# Patient Record
Sex: Female | Born: 1945 | Race: Black or African American | Hispanic: No | Marital: Single | Smoking: Former smoker
Health system: Southern US, Community
[De-identification: ages and names within clinical notes are randomized; demographics above are authoritative.]

## PROBLEM LIST (undated history)

## (undated) DIAGNOSIS — K219 Gastro-esophageal reflux disease without esophagitis: Secondary | ICD-10-CM

## (undated) DIAGNOSIS — I209 Angina pectoris, unspecified: Secondary | ICD-10-CM

## (undated) DIAGNOSIS — K649 Unspecified hemorrhoids: Secondary | ICD-10-CM

## (undated) DIAGNOSIS — I82409 Acute embolism and thrombosis of unspecified deep veins of unspecified lower extremity: Secondary | ICD-10-CM

## (undated) DIAGNOSIS — Z9889 Other specified postprocedural states: Secondary | ICD-10-CM

## (undated) DIAGNOSIS — K8020 Calculus of gallbladder without cholecystitis without obstruction: Secondary | ICD-10-CM

## (undated) DIAGNOSIS — R32 Unspecified urinary incontinence: Secondary | ICD-10-CM

## (undated) DIAGNOSIS — R112 Nausea with vomiting, unspecified: Secondary | ICD-10-CM

## (undated) DIAGNOSIS — R351 Nocturia: Secondary | ICD-10-CM

## (undated) DIAGNOSIS — G4730 Sleep apnea, unspecified: Secondary | ICD-10-CM

## (undated) DIAGNOSIS — R011 Cardiac murmur, unspecified: Secondary | ICD-10-CM

## (undated) DIAGNOSIS — D649 Anemia, unspecified: Secondary | ICD-10-CM

## (undated) DIAGNOSIS — Z9289 Personal history of other medical treatment: Secondary | ICD-10-CM

## (undated) DIAGNOSIS — E785 Hyperlipidemia, unspecified: Secondary | ICD-10-CM

## (undated) DIAGNOSIS — N189 Chronic kidney disease, unspecified: Secondary | ICD-10-CM

## (undated) DIAGNOSIS — I2609 Other pulmonary embolism with acute cor pulmonale: Secondary | ICD-10-CM

## (undated) DIAGNOSIS — M1712 Unilateral primary osteoarthritis, left knee: Secondary | ICD-10-CM

## (undated) DIAGNOSIS — E039 Hypothyroidism, unspecified: Secondary | ICD-10-CM

## (undated) DIAGNOSIS — R0600 Dyspnea, unspecified: Secondary | ICD-10-CM

## (undated) DIAGNOSIS — I739 Peripheral vascular disease, unspecified: Secondary | ICD-10-CM

## (undated) DIAGNOSIS — G8929 Other chronic pain: Secondary | ICD-10-CM

## (undated) DIAGNOSIS — E119 Type 2 diabetes mellitus without complications: Secondary | ICD-10-CM

## (undated) DIAGNOSIS — M549 Dorsalgia, unspecified: Secondary | ICD-10-CM

## (undated) DIAGNOSIS — H409 Unspecified glaucoma: Secondary | ICD-10-CM

## (undated) DIAGNOSIS — H3530 Unspecified macular degeneration: Secondary | ICD-10-CM

## (undated) DIAGNOSIS — K5790 Diverticulosis of intestine, part unspecified, without perforation or abscess without bleeding: Secondary | ICD-10-CM

## (undated) DIAGNOSIS — Z8739 Personal history of other diseases of the musculoskeletal system and connective tissue: Secondary | ICD-10-CM

## (undated) DIAGNOSIS — Z8719 Personal history of other diseases of the digestive system: Secondary | ICD-10-CM

## (undated) DIAGNOSIS — J45909 Unspecified asthma, uncomplicated: Secondary | ICD-10-CM

## (undated) DIAGNOSIS — R002 Palpitations: Secondary | ICD-10-CM

## (undated) DIAGNOSIS — I10 Essential (primary) hypertension: Secondary | ICD-10-CM

## (undated) DIAGNOSIS — K5909 Other constipation: Secondary | ICD-10-CM

## (undated) DIAGNOSIS — Z8709 Personal history of other diseases of the respiratory system: Secondary | ICD-10-CM

## (undated) HISTORY — DX: Unilateral primary osteoarthritis, left knee: M17.12

## (undated) HISTORY — DX: Gastro-esophageal reflux disease without esophagitis: K21.9

## (undated) HISTORY — PX: DILATION AND CURETTAGE OF UTERUS: SHX78

## (undated) HISTORY — PX: OTHER SURGICAL HISTORY: SHX169

## (undated) HISTORY — DX: Other pulmonary embolism with acute cor pulmonale: I26.09

## (undated) HISTORY — PX: COLONOSCOPY: SHX174

## (undated) HISTORY — DX: Type 2 diabetes mellitus without complications: E11.9

## (undated) HISTORY — DX: Hyperlipidemia, unspecified: E78.5

## (undated) HISTORY — PX: TRANSTHORACIC ECHOCARDIOGRAM: SHX275

## (undated) HISTORY — PX: ESOPHAGOGASTRODUODENOSCOPY: SHX1529

## (undated) HISTORY — DX: Diverticulosis of intestine, part unspecified, without perforation or abscess without bleeding: K57.90

## (undated) HISTORY — DX: Hypothyroidism, unspecified: E03.9

## (undated) HISTORY — DX: Essential (primary) hypertension: I10

---

## 1977-11-25 HISTORY — PX: VAGINAL HYSTERECTOMY: SUR661

## 2003-11-26 HISTORY — PX: TOTAL HIP ARTHROPLASTY: SHX124

## 2008-11-25 HISTORY — PX: THYROID SURGERY: SHX805

## 2013-01-12 DIAGNOSIS — H18519 Endothelial corneal dystrophy, unspecified eye: Secondary | ICD-10-CM | POA: Diagnosis not present

## 2013-01-12 DIAGNOSIS — H524 Presbyopia: Secondary | ICD-10-CM | POA: Diagnosis not present

## 2013-01-12 DIAGNOSIS — H40019 Open angle with borderline findings, low risk, unspecified eye: Secondary | ICD-10-CM | POA: Diagnosis not present

## 2013-01-12 DIAGNOSIS — H35019 Changes in retinal vascular appearance, unspecified eye: Secondary | ICD-10-CM | POA: Diagnosis not present

## 2013-01-12 DIAGNOSIS — H35039 Hypertensive retinopathy, unspecified eye: Secondary | ICD-10-CM | POA: Diagnosis not present

## 2013-06-11 DIAGNOSIS — M1710 Unilateral primary osteoarthritis, unspecified knee: Secondary | ICD-10-CM | POA: Diagnosis not present

## 2013-06-11 DIAGNOSIS — M545 Low back pain, unspecified: Secondary | ICD-10-CM | POA: Diagnosis not present

## 2013-06-11 DIAGNOSIS — IMO0002 Reserved for concepts with insufficient information to code with codable children: Secondary | ICD-10-CM | POA: Diagnosis not present

## 2013-06-11 DIAGNOSIS — M25559 Pain in unspecified hip: Secondary | ICD-10-CM | POA: Diagnosis not present

## 2013-06-19 DIAGNOSIS — M47817 Spondylosis without myelopathy or radiculopathy, lumbosacral region: Secondary | ICD-10-CM | POA: Diagnosis not present

## 2013-06-25 DIAGNOSIS — M47817 Spondylosis without myelopathy or radiculopathy, lumbosacral region: Secondary | ICD-10-CM | POA: Diagnosis not present

## 2013-07-28 DIAGNOSIS — E039 Hypothyroidism, unspecified: Secondary | ICD-10-CM | POA: Diagnosis not present

## 2013-07-28 DIAGNOSIS — E785 Hyperlipidemia, unspecified: Secondary | ICD-10-CM | POA: Diagnosis not present

## 2013-07-28 DIAGNOSIS — Q619 Cystic kidney disease, unspecified: Secondary | ICD-10-CM | POA: Diagnosis not present

## 2013-07-28 DIAGNOSIS — I10 Essential (primary) hypertension: Secondary | ICD-10-CM | POA: Diagnosis not present

## 2013-07-28 DIAGNOSIS — E041 Nontoxic single thyroid nodule: Secondary | ICD-10-CM | POA: Diagnosis not present

## 2013-07-28 DIAGNOSIS — R7309 Other abnormal glucose: Secondary | ICD-10-CM | POA: Diagnosis not present

## 2013-07-28 DIAGNOSIS — K219 Gastro-esophageal reflux disease without esophagitis: Secondary | ICD-10-CM | POA: Diagnosis not present

## 2013-07-28 DIAGNOSIS — Z23 Encounter for immunization: Secondary | ICD-10-CM | POA: Diagnosis not present

## 2013-07-29 ENCOUNTER — Other Ambulatory Visit: Payer: Self-pay | Admitting: Family Medicine

## 2013-07-29 DIAGNOSIS — E042 Nontoxic multinodular goiter: Secondary | ICD-10-CM

## 2013-08-02 ENCOUNTER — Ambulatory Visit
Admission: RE | Admit: 2013-08-02 | Discharge: 2013-08-02 | Disposition: A | Discharge status: Home / Self Care | Payer: Medicare Other | Source: Ambulatory Visit | Attending: Family Medicine | Admitting: Family Medicine

## 2013-08-02 DIAGNOSIS — E042 Nontoxic multinodular goiter: Secondary | ICD-10-CM

## 2013-09-06 DIAGNOSIS — Z23 Encounter for immunization: Secondary | ICD-10-CM | POA: Diagnosis not present

## 2013-09-09 DIAGNOSIS — M1710 Unilateral primary osteoarthritis, unspecified knee: Secondary | ICD-10-CM | POA: Diagnosis not present

## 2013-10-25 DIAGNOSIS — IMO0002 Reserved for concepts with insufficient information to code with codable children: Secondary | ICD-10-CM | POA: Diagnosis not present

## 2013-10-25 DIAGNOSIS — M48061 Spinal stenosis, lumbar region without neurogenic claudication: Secondary | ICD-10-CM | POA: Diagnosis not present

## 2013-11-22 ENCOUNTER — Encounter: Payer: Self-pay | Admitting: Cardiology

## 2013-11-22 DIAGNOSIS — K219 Gastro-esophageal reflux disease without esophagitis: Secondary | ICD-10-CM | POA: Diagnosis not present

## 2013-11-22 DIAGNOSIS — E039 Hypothyroidism, unspecified: Secondary | ICD-10-CM | POA: Diagnosis not present

## 2013-11-22 DIAGNOSIS — R7309 Other abnormal glucose: Secondary | ICD-10-CM | POA: Diagnosis not present

## 2013-11-22 DIAGNOSIS — I10 Essential (primary) hypertension: Secondary | ICD-10-CM | POA: Insufficient documentation

## 2013-11-22 DIAGNOSIS — E785 Hyperlipidemia, unspecified: Secondary | ICD-10-CM | POA: Insufficient documentation

## 2013-11-22 DIAGNOSIS — I517 Cardiomegaly: Secondary | ICD-10-CM | POA: Diagnosis not present

## 2013-11-23 ENCOUNTER — Encounter: Payer: Self-pay | Admitting: Cardiology

## 2013-12-03 ENCOUNTER — Other Ambulatory Visit: Payer: Self-pay

## 2013-12-03 DIAGNOSIS — Z1231 Encounter for screening mammogram for malignant neoplasm of breast: Secondary | ICD-10-CM

## 2013-12-06 ENCOUNTER — Encounter: Payer: Self-pay | Admitting: Cardiology

## 2013-12-06 ENCOUNTER — Ambulatory Visit (INDEPENDENT_AMBULATORY_CARE_PROVIDER_SITE_OTHER): Payer: Medicare Other | Admitting: Cardiology

## 2013-12-06 VITALS — BP 130/84 | HR 84 | Ht 61.0 in | Wt 212.0 lb

## 2013-12-06 DIAGNOSIS — R002 Palpitations: Secondary | ICD-10-CM | POA: Insufficient documentation

## 2013-12-06 DIAGNOSIS — R5381 Other malaise: Secondary | ICD-10-CM

## 2013-12-06 DIAGNOSIS — E039 Hypothyroidism, unspecified: Secondary | ICD-10-CM

## 2013-12-06 DIAGNOSIS — R5383 Other fatigue: Secondary | ICD-10-CM | POA: Diagnosis not present

## 2013-12-06 DIAGNOSIS — I493 Ventricular premature depolarization: Secondary | ICD-10-CM | POA: Insufficient documentation

## 2013-12-06 DIAGNOSIS — I4949 Other premature depolarization: Secondary | ICD-10-CM | POA: Diagnosis not present

## 2013-12-06 NOTE — Progress Notes (Signed)
[facility]. [address], [address] [place], [place]  [address] Phone: [phone] Fax:  [phone]  Date:  12/06/2013   ID:  Tricia Harrison, DOB 01/09/1946, MRN [id number]  PCP:  No primary provider on file.   History of Present Illness: Tricia Harrison is a 68 y.o. female here for evaluation of right atrial enlargement. According to records, she recently moved from [place] and had workup done for elevated right atrial pressures including MRI of chest and no abnormalities were detected. Followup was advised.  I reviewed a previous cardiology note on 10/07/12. She previously had palpitations on exertion lasting approximately 1 minute. She had a laxity scan Myoview with EF of 60% that was "negative ". A Holter monitor showed 1800 isolated PVCs with no ventricular tachycardia. Echocardiogram showed normal EF, Doppler flow in right atrium of indeterminate origin, rule out anomalous venous drainage versus small ASD versus coronary fistula-therefore she had MRI. MRA/MRI of chest was done on 12/10/12 and showed no anomalous venous drainage into right atrium. 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 TEE or transesophageal echocardiogram was also done which showed mildly dilated right side with preserved systolic function, normal left ventricular ejection fraction, no evidence of PFO or ASD.  Her main complaint is shortness of breath, fatigue, occasional palpitations. As she ages, the symptoms seem to get worse. She at one point cut out caffeine and this helped out significantly.   Wt Readings from Last 3 Encounters:  12/06/13 212 lb (96.163 kg)     Past Medical History  Diagnosis Date  . HTN (hypertension)   . Hypothyroidism   . Bilateral ovarian cysts   . Hx of gallstones   . Hernia, hiatal   . Renal cysts, acquired, bilateral   . GERD (gastroesophageal reflux disease)   . Macular degeneration, left eye   . HLD (hyperlipidemia)   .  Diverticulosis   . Lumbar spinal stenosis     Past Surgical History  Procedure Laterality Date  . Vaginal hysterectomy    . Total hip arthroplasty Left   . Thyroid surgery      Current Outpatient Prescriptions  Medication Sig Dispense Refill  . Cholecalciferol (VITAMIN D3) 2000 UNITS TABS Take 2,000 mg by mouth daily.      Marland Kitchen diltiazem (CARDIZEM LA) 360 MG 24 hr tablet Take 360 mg by mouth daily.      . fenofibrate (TRICOR) 145 MG tablet Take 145 mg by mouth daily.      Marland Kitchen levothyroxine (SYNTHROID, LEVOTHROID) 75 MCG tablet Take 75 mcg by mouth daily before breakfast.      . ramipril (ALTACE) 10 MG capsule Take 10 mg by mouth daily.      . traMADol (ULTRAM) 50 MG tablet Take 50 mg by mouth every 6 (six) hours as needed.       No current facility-administered medications for this visit.    Allergies:    Allergies  Allergen Reactions  . Codeine Nausea And Vomiting    Social History:  The patient  reports that she has quit smoking. She does not have any smokeless tobacco history on file. She reports that she drinks about 1.8 ounces of alcohol per week. She reports that she does not use illicit drugs.   Family History  Problem Relation Age of Onset  . Uterine cancer Mother   . Heart disease Mother   . Hypertension Mother   . Diabetes Mother   . Cirrhosis Father   .  Pneumonia Father   . Lymphoma Sister     ,non hodgkin   . Diabetes Sister     ROS:  Please see the history of present illness.   No syncope, no bleeding, no orthopnea, no PND. Positive for palpitations, fatigue, weight.   All other systems reviewed and negative.   PHYSICAL EXAM: VS:  Ht 5\' 1"  (1.549 m)  Wt 212 lb (96.163 kg)  BMI 40.08 kg/m2 Well nourished, well developed, in no acute distress HEENT: normal, [place]/AT, EOMI Neck: no JVD, normal carotid upstroke, no bruit Cardiac:  normal S1, S2; RRR; no murmur Lungs:  clear to auscultation bilaterally, no wheezing, rhonchi or rales Abd: soft, nontender, no  hepatomegaly, no bruitsObese Ext: no edema, 2+ distal pulses Skin: warm and dry GU: deferred Neuro: no focal abnormalities noted, AAO x 3  EKG:   None today. Sinus rhythm, PVC 2013 rate 70 Extensive review of previous medical records as described above.  ASSESSMENT AND PLAN:  1. Palpitations-continue with diltiazem to help with suppression. Continue to try to avoid caffeinated products and decongestants. Exercise will be very helpful for this as well. Weight loss will be helpful as well. No further cardiac testing warranted. She had extensive testing including stress test, echocardiogram, transesophageal echocardiogram, MRA of chest previously. No evidence of ASD. 2. Dilated right atrium-this seemed to be prominent on MRA as well as TEE. This is likely secondary to obesity. No evidence of ASD. Reassurance. 3. Morbid obesity-continue to encourage weight loss. Fatigue has been an issue. When she decreased her carbohydrates, she stated that she became more fatigued. 4. Fatigue-likely multifactorial. One could consider sleep study to exclude sleep apnea. She has not had this done. 5. PVCs-as above, diltiazem. 6. We'll see back in one year. No further cardiac testing.  Signed, Candee Furbish, MD [facility]  12/06/2013 2:04 PM

## 2013-12-06 NOTE — Patient Instructions (Signed)
Your physician wants you to follow-up in: 1 year with Marlou Porch You will receive a reminder letter in the mail two months in advance. If you don't receive a letter, please call our office to schedule the follow-up appointment.  Your physician recommends that you continue on your current medications as directed. Please refer to the Current Medication list given to you today.

## 2013-12-21 DIAGNOSIS — M1710 Unilateral primary osteoarthritis, unspecified knee: Secondary | ICD-10-CM | POA: Diagnosis not present

## 2013-12-21 DIAGNOSIS — IMO0002 Reserved for concepts with insufficient information to code with codable children: Secondary | ICD-10-CM | POA: Diagnosis not present

## 2013-12-22 ENCOUNTER — Ambulatory Visit
Admission: RE | Admit: 2013-12-22 | Discharge: 2013-12-22 | Disposition: A | Discharge status: Home / Self Care | Payer: Medicare Other | Source: Ambulatory Visit

## 2013-12-22 DIAGNOSIS — Z1231 Encounter for screening mammogram for malignant neoplasm of breast: Secondary | ICD-10-CM | POA: Diagnosis not present

## 2013-12-30 ENCOUNTER — Encounter: Payer: Medicare Other | Attending: Family Medicine

## 2013-12-30 VITALS — Ht 61.0 in | Wt 208.0 lb

## 2013-12-30 DIAGNOSIS — E119 Type 2 diabetes mellitus without complications: Secondary | ICD-10-CM | POA: Insufficient documentation

## 2013-12-30 NOTE — Progress Notes (Signed)
Patient was seen on 12/30/13 for the first of a series of three diabetes self-management courses at the [facility].  Current HbA1c: 6.5%  The following learning objectives were met by the patient during this class:  Describe diabetes  State some common risk factors for diabetes  Defines the role of glucose and insulin  Identifies type of diabetes and pathophysiology  Describe the relationship between diabetes and cardiovascular risk  State the members of the Healthcare Team  States the rationale for glucose monitoring  State when to test glucose  State their individual Target Range  State the importance of logging glucose readings  Describe how to interpret glucose readings  Identifies A1C target  Explain the correlation between A1c and eAG values  State symptoms and treatment of high blood glucose  State symptoms and treatment of low blood glucose  Explain proper technique for glucose testing  Identifies proper sharps disposal  Handouts given during class include:  Living Well with Diabetes book  Carb Counting and Meal Planning book  Meal Plan Card  Carbohydrate guide  Meal planning worksheet  Low Sodium Flavoring Tips  The diabetes portion plate  V0Y to eAG Conversion Chart  Diabetes Medications  Diabetes Recommended Care Schedule  Support Group  Diabetes Success Plan  Core Class Satisfaction Survey  Follow-Up Plan:  Attend core 2

## 2014-01-06 DIAGNOSIS — E119 Type 2 diabetes mellitus without complications: Secondary | ICD-10-CM

## 2014-01-13 DIAGNOSIS — E119 Type 2 diabetes mellitus without complications: Secondary | ICD-10-CM

## 2014-01-14 NOTE — Progress Notes (Signed)
Patient was seen on 01/13/14 for the third of a series of three diabetes self-management courses at the [facility]. The following learning objectives were met by the patient during this class:    State the amount of activity recommended for healthy living   Describe activities suitable for individual needs   Identify ways to regularly incorporate activity into daily life   Identify barriers to activity and ways to over come these barriers  Identify diabetes medications being personally used and their primary action for lowering glucose and possible side effects   Describe role of stress on blood glucose and develop strategies to address psychosocial issues   Identify diabetes complications and ways to prevent them  Explain how to manage diabetes during illness   Evaluate success in meeting personal goal   Establish 2-3 goals that they will plan to diligently work on until they return for the  44-monthfollow-up visit  Goals:  Follow Diabetes Meal Plan as instructed  Aim for 15-30 mins of physical activity daily as tolerated  Bring food record and glucose log to your follow up visit  Your patient has established the following 4 month goals in their individualized success plan:  Count carbohydrates at most meals and snacks  Reduce fat in my diet by eating less  Increase my activity at least 5 days a week for 30 minutes  Test my glucose at least 2 X per week  Look for patterns in my record book at least 5 days a month  To help manage my stress, I will exercise at least 5 times a week  I will eat 3 meals each day to include protein and carbs  Your patient has identified these potential barriers to change:  Eating out so often  Your patient has identified their diabetes self-care support plan as  [facility]  Family

## 2014-01-17 NOTE — Progress Notes (Signed)

## 2014-01-25 ENCOUNTER — Telehealth: Payer: Self-pay | Admitting: Cardiology

## 2014-01-25 NOTE — Telephone Encounter (Signed)
Surgical Clearance

## 2014-02-01 ENCOUNTER — Other Ambulatory Visit: Payer: Self-pay | Admitting: Physician Assistant

## 2014-02-01 ENCOUNTER — Encounter: Payer: Self-pay | Admitting: Physician Assistant

## 2014-02-01 DIAGNOSIS — K219 Gastro-esophageal reflux disease without esophagitis: Secondary | ICD-10-CM

## 2014-02-01 DIAGNOSIS — M1712 Unilateral primary osteoarthritis, left knee: Secondary | ICD-10-CM

## 2014-02-01 DIAGNOSIS — K5790 Diverticulosis of intestine, part unspecified, without perforation or abscess without bleeding: Secondary | ICD-10-CM | POA: Insufficient documentation

## 2014-02-01 DIAGNOSIS — M48061 Spinal stenosis, lumbar region without neurogenic claudication: Secondary | ICD-10-CM | POA: Insufficient documentation

## 2014-02-01 DIAGNOSIS — IMO0002 Reserved for concepts with insufficient information to code with codable children: Secondary | ICD-10-CM | POA: Diagnosis not present

## 2014-02-01 DIAGNOSIS — N281 Cyst of kidney, acquired: Secondary | ICD-10-CM

## 2014-02-01 DIAGNOSIS — K449 Diaphragmatic hernia without obstruction or gangrene: Secondary | ICD-10-CM

## 2014-02-01 DIAGNOSIS — M1710 Unilateral primary osteoarthritis, unspecified knee: Secondary | ICD-10-CM | POA: Diagnosis not present

## 2014-02-01 DIAGNOSIS — Z8719 Personal history of other diseases of the digestive system: Secondary | ICD-10-CM | POA: Insufficient documentation

## 2014-02-01 DIAGNOSIS — N83201 Unspecified ovarian cyst, right side: Secondary | ICD-10-CM

## 2014-02-01 DIAGNOSIS — E119 Type 2 diabetes mellitus without complications: Secondary | ICD-10-CM | POA: Insufficient documentation

## 2014-02-01 DIAGNOSIS — N83202 Unspecified ovarian cyst, left side: Principal | ICD-10-CM

## 2014-02-01 NOTE — H&P (Signed)
TOTAL KNEE ADMISSION H&P  Patient is being admitted for left total knee arthroplasty.  Subjective:  Chief Complaint:left knee pain.  HPI: Tricia Harrison, 68 y.o. female, has a history of pain and functional disability in the left knee due to arthritis and has failed non-surgical conservative treatments for greater than 12 weeks to includeNSAID's and/or analgesics, corticosteriod injections, viscosupplementation injections, flexibility and strengthening excercises, supervised PT with diminished ADL's post treatment, use of assistive devices, weight reduction as appropriate and activity modification.  Onset of symptoms was gradual, starting 10 years ago with gradually worsening course since that time. The patient noted no past surgery on the left knee(s).  Patient currently rates pain in the left knee(s) at 10 out of 10 with activity. Patient has night pain, worsening of pain with activity and weight bearing, pain that interferes with activities of daily living, crepitus and joint swelling.  Patient has evidence of subchondral sclerosis, periarticular osteophytes and joint space narrowing by imaging studies.  There is no active infection.  Patient Active Problem List   Diagnosis Date Noted  . Bilateral ovarian cysts   . Hx of gallstones   . Hernia, hiatal   . Renal cysts, acquired, bilateral   . GERD (gastroesophageal reflux disease)   . Diverticulosis   . Lumbar spinal stenosis   . Diabetes mellitus without complication   . Left knee DJD   . Palpitations 12/06/2013  . Morbid obesity 12/06/2013  . Fatigue 12/06/2013  . PVC (premature ventricular contraction) 12/06/2013  . HTN (hypertension)   . Hypothyroidism   . HLD (hyperlipidemia)    Past Medical History  Diagnosis Date  . HTN (hypertension)   . Hypothyroidism   . Bilateral ovarian cysts   . Hx of gallstones   . Hernia, hiatal   . Renal cysts, acquired, bilateral   . GERD (gastroesophageal reflux disease)   . Macular  degeneration, left eye   . HLD (hyperlipidemia)   . Diverticulosis   . Lumbar spinal stenosis   . Diabetes mellitus without complication   . Left knee DJD     Past Surgical History  Procedure Laterality Date  . Vaginal hysterectomy    . Total hip arthroplasty Left   . Thyroid surgery       (Not in a hospital admission) Allergies  Allergen Reactions  . Codeine Nausea And Vomiting    Current Outpatient Prescriptions on File Prior to Visit  Medication Sig Dispense Refill  . Cholecalciferol (VITAMIN D3) 2000 UNITS TABS Take 2,000 mg by mouth daily.      Marland Kitchen diltiazem (CARDIZEM LA) 360 MG 24 hr tablet Take 360 mg by mouth daily.      . fenofibrate (TRICOR) 145 MG tablet Take 145 mg by mouth daily.      Marland Kitchen levothyroxine (SYNTHROID, LEVOTHROID) 75 MCG tablet Take 75 mcg by mouth daily before breakfast.      . ramipril (ALTACE) 10 MG capsule Take 10 mg by mouth daily.      . traMADol (ULTRAM) 50 MG tablet Take 50 mg by mouth every 6 (six) hours as needed.       No current facility-administered medications on file prior to visit.    History  Substance Use Topics  . Smoking status: Former Research scientist (life sciences)  . Smokeless tobacco: Not on file  . Alcohol Use: 1.8 oz/week    3 Glasses of wine per week     Comment: wine 2-3 times a week    Family History  Problem Relation Age of  Onset  . Uterine cancer Mother   . Heart disease Mother   . Hypertension Mother   . Diabetes Mother   . Cirrhosis Father   . Pneumonia Father   . Lymphoma Sister     ,non hodgkin   . Diabetes Sister      Review of Systems  Constitutional: Negative.   HENT: Negative.   Eyes: Negative.   Respiratory: Negative.   Cardiovascular: Negative.   Gastrointestinal: Negative.   Genitourinary: Negative.   Musculoskeletal: Positive for back pain and joint pain.       Left knee pain and low back pain  Skin: Negative.   Neurological: Negative.   Endo/Heme/Allergies: Negative.   Psychiatric/Behavioral: Negative.      Objective:  Physical Exam  Constitutional: She is oriented to person, place, and time. She appears well-developed and well-nourished.  HENT:  Head: Normocephalic and atraumatic.  Mouth/Throat: Oropharynx is clear and moist.  Eyes: Conjunctivae and EOM are normal. Pupils are equal, round, and reactive to light.  Neck: Neck supple.  Cardiovascular: Normal rate and intact distal pulses.  Exam reveals no gallop and no friction rub.   Murmur heard. Respiratory: Effort normal and breath sounds normal. No respiratory distress. She has no wheezes. She has no rales. She exhibits no tenderness.  GI: Soft. Bowel sounds are normal. She exhibits no distension and no mass. There is no tenderness. There is no rebound and no guarding.  Genitourinary:  Not pertinent to current symptomatology therefore not examined.  Musculoskeletal:  Examination of her left knee reveals valgus deformity pain medially and laterally, 1+ synovitis 1+ crepitation diffuse pain, range of motion from -5 to 125 degrees with normal patella tracking. Exam of the right knee reveals mild pain 1+ synovitis full range of motion knee is stable with normal patella tracking. Vascular exam: pulses 2+ and symmetric. She does have significant weakness trying to flex her hip and bend her left knee and has difficulty getting in and out of a car because of this.   Neurological: She is alert and oriented to person, place, and time.  Skin: Skin is warm and dry.  Psychiatric: She has a normal mood and affect. Her behavior is normal.    Vital signs in last 24 hours: Last recorded: 03/10 1300   BP: 119/76 Pulse: 85  Temp: 97.5 F (36.4 C)    Height: 5' (1.524 m) SpO2: 99  Weight: 93.441 kg (206 lb)     Labs:   Estimated body mass index is 40.23 kg/(m^2) as calculated from the following:   Height as of this encounter: 5' (1.524 m).   Weight as of this encounter: 93.441 kg (206 lb).   Imaging Review Plain radiographs demonstrate  severe degenerative joint disease of the left knee(s). The overall alignment issignificant valgus. The bone quality appears to be good for age and reported activity level.  Assessment/Plan:  End stage arthritis, left knee   The patient history, physical examination, clinical judgment of the provider and imaging studies are consistent with end stage degenerative joint disease of the left knee(s) and total knee arthroplasty is deemed medically necessary. The treatment options including medical management, injection therapy arthroscopy and arthroplasty were discussed at length. The risks and benefits of total knee arthroplasty were presented and reviewed. The risks due to aseptic loosening, infection, stiffness, patella tracking problems, thromboembolic complications and other imponderables were discussed. The patient acknowledged the explanation, agreed to proceed with the plan and consent was signed. Patient is being admitted for  inpatient treatment for surgery, pain control, PT, OT, prophylactic antibiotics, VTE prophylaxis, progressive ambulation and ADL's and discharge planning. The patient is planning to be discharged home with home health services  Hilde Churchman A. Meade Hogeland, PA-C Physician Assistant [facility] [phone]  02/01/2014, 2:08 PM   

## 2014-02-07 ENCOUNTER — Ambulatory Visit
Admission: RE | Admit: 2014-02-07 | Discharge: 2014-02-07 | Disposition: A | Discharge status: Home / Self Care | Payer: Medicare Other | Source: Ambulatory Visit | Attending: Family Medicine | Admitting: Family Medicine

## 2014-02-07 ENCOUNTER — Other Ambulatory Visit: Payer: Self-pay | Admitting: Family Medicine

## 2014-02-07 ENCOUNTER — Encounter (HOSPITAL_COMMUNITY): Payer: Self-pay | Admitting: Pharmacy Technician

## 2014-02-07 DIAGNOSIS — I10 Essential (primary) hypertension: Secondary | ICD-10-CM | POA: Diagnosis not present

## 2014-02-07 DIAGNOSIS — M25569 Pain in unspecified knee: Secondary | ICD-10-CM | POA: Diagnosis not present

## 2014-02-07 DIAGNOSIS — E785 Hyperlipidemia, unspecified: Secondary | ICD-10-CM | POA: Diagnosis not present

## 2014-02-07 DIAGNOSIS — Z01818 Encounter for other preprocedural examination: Secondary | ICD-10-CM | POA: Diagnosis not present

## 2014-02-07 DIAGNOSIS — E039 Hypothyroidism, unspecified: Secondary | ICD-10-CM | POA: Diagnosis not present

## 2014-02-07 DIAGNOSIS — R7309 Other abnormal glucose: Secondary | ICD-10-CM | POA: Diagnosis not present

## 2014-02-14 ENCOUNTER — Encounter (HOSPITAL_COMMUNITY)
Admission: RE | Admit: 2014-02-14 | Discharge: 2014-02-14 | Disposition: A | Discharge status: Home / Self Care | Payer: Medicare Other | Source: Ambulatory Visit | Attending: Orthopedic Surgery | Admitting: Orthopedic Surgery

## 2014-02-14 ENCOUNTER — Encounter (HOSPITAL_COMMUNITY): Payer: Self-pay

## 2014-02-14 DIAGNOSIS — Z01812 Encounter for preprocedural laboratory examination: Secondary | ICD-10-CM | POA: Diagnosis not present

## 2014-02-14 HISTORY — DX: Anemia, unspecified: D64.9

## 2014-02-14 HISTORY — DX: Personal history of other diseases of the respiratory system: Z87.09

## 2014-02-14 HISTORY — DX: Other constipation: K59.09

## 2014-02-14 HISTORY — DX: Calculus of gallbladder without cholecystitis without obstruction: K80.20

## 2014-02-14 HISTORY — DX: Personal history of other diseases of the digestive system: Z87.19

## 2014-02-14 HISTORY — DX: Nocturia: R35.1

## 2014-02-14 HISTORY — DX: Personal history of other diseases of the musculoskeletal system and connective tissue: Z87.39

## 2014-02-14 HISTORY — DX: Palpitations: R00.2

## 2014-02-14 HISTORY — DX: Other specified postprocedural states: R11.2

## 2014-02-14 HISTORY — DX: Other chronic pain: G89.29

## 2014-02-14 HISTORY — DX: Personal history of other medical treatment: Z92.89

## 2014-02-14 HISTORY — DX: Dorsalgia, unspecified: M54.9

## 2014-02-14 HISTORY — DX: Unspecified hemorrhoids: K64.9

## 2014-02-14 HISTORY — DX: Unspecified asthma, uncomplicated: J45.909

## 2014-02-14 HISTORY — DX: Other specified postprocedural states: Z98.890

## 2014-02-14 LAB — URINALYSIS, ROUTINE W REFLEX MICROSCOPIC
BILIRUBIN URINE: NEGATIVE
GLUCOSE, UA: NEGATIVE mg/dL
Hgb urine dipstick: NEGATIVE
Ketones, ur: NEGATIVE mg/dL
NITRITE: NEGATIVE
PH: 5 (ref 5.0–8.0)
Protein, ur: NEGATIVE mg/dL
Specific Gravity, Urine: 1.025 (ref 1.005–1.030)
Urobilinogen, UA: 0.2 mg/dL (ref 0.0–1.0)

## 2014-02-14 LAB — TYPE AND SCREEN
ABO/RH(D): A POS
Antibody Screen: NEGATIVE

## 2014-02-14 LAB — APTT: aPTT: 26 seconds (ref 24–37)

## 2014-02-14 LAB — PROTIME-INR
INR: 1.05 (ref 0.00–1.49)
PROTHROMBIN TIME: 13.5 s (ref 11.6–15.2)

## 2014-02-14 LAB — URINE MICROSCOPIC-ADD ON

## 2014-02-14 LAB — SURGICAL PCR SCREEN
MRSA, PCR: NEGATIVE
Staphylococcus aureus: NEGATIVE

## 2014-02-14 LAB — ABO/RH: ABO/RH(D): A POS

## 2014-02-14 MED ORDER — CHLORHEXIDINE GLUCONATE 4 % EX LIQD: 60.0000 mL | Freq: Once | CUTANEOUS | Status: DC

## 2014-02-14 MED ORDER — POVIDONE-IODINE 7.5 % EX SOLN: Freq: Once | CUTANEOUS | Status: DC

## 2014-02-14 NOTE — Pre-Procedure Instructions (Signed)
Tricia Harrison  02/14/2014   Your procedure is scheduled on:  Mon, Mar 30 @ 12:15 PM  Report to [facility]  at 10:15 AM.  Call this number if you have problems the morning of surgery: [phone]   Remember:   Do not eat food or drink liquids after midnight.   Take these medicines the morning of surgery with A SIP OF WATER: Diltiazem(Cardizem),Synthroid(Levothyroxine),and Omeprazole(Prilosec)             No Goody's,BC's,Aleve,Ibuprofen,Aspirin,Fish Oil,or any Herbal Medications   Do not wear jewelry, make-up or nail polish.  Do not wear lotions, powders, or perfumes. You may wear deodorant.  Do not shave 48 hours prior to surgery.   Do not bring valuables to the hospital.  [facility] is not responsible                  for any belongings or valuables.               Contacts, dentures or bridgework may not be worn into surgery.  Leave suitcase in the car. After surgery it may be brought to your room.  For patients admitted to the hospital, discharge time is determined by your                treatment team.                Special Instructions:  [place] - Preparing for Surgery  Before surgery, you can play an important role.  Because skin is not sterile, your skin needs to be as free of germs as possible.  You can reduce the number of germs on you skin by washing with CHG (chlorahexidine gluconate) soap before surgery.  CHG is an antiseptic cleaner which kills germs and bonds with the skin to continue killing germs even after washing.  Please DO NOT use if you have an allergy to CHG or antibacterial soaps.  If your skin becomes reddened/irritated stop using the CHG and inform your nurse when you arrive at Short Stay.  Do not shave (including legs and underarms) for at least 48 hours prior to the first CHG shower.  You may shave your face.  Please follow these instructions carefully:   1.  Shower with CHG Soap the night before surgery and the                                 morning of Surgery.  2.  If you choose to wash your hair, wash your hair first as usual with your       normal shampoo.  3.  After you shampoo, rinse your hair and body thoroughly to remove the                      Shampoo.  4.  Use CHG as you would any other liquid soap.  You can apply chg directly       to the skin and wash gently with scrungie or a clean washcloth.  5.  Apply the CHG Soap to your body ONLY FROM THE NECK DOWN.        Do not use on open wounds or open sores.  Avoid contact with your eyes,       ears, mouth and genitals (private parts).  Wash genitals (private parts)       with your normal soap.  6.  Wash  thoroughly, paying special attention to the area where your surgery        will be performed.  7.  Thoroughly rinse your body with warm water from the neck down.  8.  DO NOT shower/wash with your normal soap after using and rinsing off       the CHG Soap.  9.  Pat yourself dry with a clean towel.            10.  Wear clean pajamas.            11.  Place clean sheets on your bed the night of your first shower and do not        sleep with pets.  Day of Surgery  Do not apply any lotions/deoderants the morning of surgery.  Please wear clean clothes to the hospital/surgery center.     Please read over the following fact sheets that you were given: Pain Booklet, Coughing and Deep Breathing, Blood Transfusion Information, Total Joint Packet and MRSA Information

## 2014-02-14 NOTE — Progress Notes (Signed)
02/14/14 1141  OBSTRUCTIVE SLEEP APNEA  Have you ever been diagnosed with sleep apnea through a sleep study? No  Do you snore loudly (loud enough to be heard through closed doors)?  1  Do you often feel tired, fatigued, or sleepy during the daytime? 0  Has anyone observed you stop breathing during your sleep? 0  Do you have, or are you being treated for high blood pressure? 1  BMI more than 35 kg/m2? 1  Age over 68 years old? 1  Neck circumference greater than 40 cm/18 inches? 1  Gender: 0  Obstructive Sleep Apnea Score 5  Score 4 or greater  Results sent to PCP

## 2014-02-14 NOTE — Progress Notes (Addendum)
Saw Dr.Skains in Dec 2014-note in epic  Pt is from [place] and has echo/stress test/TEE that was sent to Medical Md-to request  EKG in chart from 02-07-14  Echo report in epic from 2013  CXR in epic from 02-07-14  Medical Md is Dr.Dibas Dorthy Cooler  Denies ever having a heart cath  Denies ever having a sleep study

## 2014-02-15 LAB — URINE CULTURE: Colony Count: 4000

## 2014-02-16 NOTE — Progress Notes (Addendum)
Anesthesia Chart Review:  Patient is a 68 year old female scheduled for Left TKR on 02/21/2014 by Dr. Noemi Chapel.   History includes former smoker, postoperative nausea and vomiting, hyperlipidemia, hypothyroidism, palpitations, diverticulosis, GERD, dysphagia, hypertension, asthma, gout, hiatal hernia, gallstones, pre-diabetes mellitus type 2, spinal stenosis with chronic back pain, childhood anemia OSA screening score was 5. BMI is 41 consistent with morbid obesity.  PCP is Dr. Lujean Amel with [facility].  Dr. Dorthy Cooler cleared her medically, but also recommended cardiac clearance.  Sherri at Dr. Archie Endo office with follow-up with Dr. Marlou Porch' office.  TEE on 11/11/2012 at [facility] in [place] showed normal left ventricular size and systolic function with ejection fraction 60%. Mildly dilated right side with preserved systolic function. 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 Recommend cardiac MRI for further evaluation. She subsequently had an MRA of the chest on 12/10/2012 that showed no evidence of anomalous pulmonary venous return. 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 Prominence of the right atrium. Continued followup with an MRI of the chest may be helpful to exclude an atrial septal defect. Records from [facility] were reviewed by Dr. Marlou Porch.  He felt that her dilated RA was likely secondary to obesity and did not feel there was evidence of an ASD.  He recommended continued diltiazem for her palpitations and PVCs along with caffeine avoidence. He did not recommend further cardiac testing at that time.  According to Dr. Marlou Porch' notes in [facility], she had a Myoview with EF of 60% that was "negative" and that previous Holter monitor from 2013 demonstrates a 4.9 second pause at 5:45 AM likely corresponding with sleep, asymptomatic. Frequent PVCs  and PACs were noted, less than 1000. seix symptomatic episodes.  EKG on 02/07/14 [facility]) showed SR with frequent PCVs, LAFB, diffuse non-specific T wave abnormality.  Chest x-ray on 02/07/2014 showed no acute findings. Heart size normal. Lungs clear. S-shaped scoliosis in the thoraco-lumbar spine.  Preoperative labs noted on 02/14/14 noted. Urine culture showed insignificant growth. She had a CBC and CMET at Dr. Versie Starks office on 02/07/14.  H/H 12.8/37.7, WBC 8.0, PLT 436K. BUN/Cr 24/1.13, glucose 91, calcium 10.6, ALP 37, AAST 54, ALT 47. Na 140, K 3.9. TSH was low at 0.25 and her Synthroid dose was decreased.  She has been medically cleared, but her PCP also recommended cardiac clearance.  Based on Dr. Kingsley Plan note from 12/06/13, she appeared stable with no further testing recommended at that time, so I am not anticipating need for further preoperative cardiac testing; however, Dr. Archie Endo office is following up with formal input from Dr. Marlou Porch. If she is cleared then I am anticipating that she can proceed as planned. (Update: Dr. Marlou Porch cleared for surgery with at mild increased risk with obesity.)  George Hugh [facility] Short Stay Center/Anesthesiology Phone [phone] 02/16/2014 12:57 PM

## 2014-02-18 NOTE — Progress Notes (Signed)
Pt. Notified of surgical time change,to arrive at 8:45 AM ,surgery @ 10:45 AM.  Pt. Voices understanding.

## 2014-02-20 MED ORDER — CEFAZOLIN SODIUM-DEXTROSE 2-3 GM-% IV SOLR: 2.0000 g | INTRAVENOUS | Status: DC

## 2014-02-21 ENCOUNTER — Inpatient Hospital Stay (HOSPITAL_COMMUNITY)
Admission: RE | Admit: 2014-02-21 | Discharge: 2014-02-22 | DRG: 470 | Disposition: A | Discharge status: Home Health | Payer: Medicare Other | Source: Ambulatory Visit | Attending: Orthopedic Surgery | Admitting: Orthopedic Surgery

## 2014-02-21 ENCOUNTER — Inpatient Hospital Stay (HOSPITAL_COMMUNITY): Payer: Medicare Other | Admitting: Anesthesiology

## 2014-02-21 ENCOUNTER — Encounter (HOSPITAL_COMMUNITY): Payer: Medicare Other | Admitting: Vascular Surgery

## 2014-02-21 ENCOUNTER — Encounter (HOSPITAL_COMMUNITY)
Admission: RE | Disposition: A | Discharge status: Home Health | Payer: Self-pay | Source: Ambulatory Visit | Attending: Orthopedic Surgery

## 2014-02-21 ENCOUNTER — Encounter (HOSPITAL_COMMUNITY): Payer: Self-pay | Admitting: *Deleted

## 2014-02-21 DIAGNOSIS — Z6841 Body Mass Index (BMI) 40.0 and over, adult: Secondary | ICD-10-CM | POA: Diagnosis not present

## 2014-02-21 DIAGNOSIS — I10 Essential (primary) hypertension: Secondary | ICD-10-CM | POA: Diagnosis not present

## 2014-02-21 DIAGNOSIS — Z807 Family history of other malignant neoplasms of lymphoid, hematopoietic and related tissues: Secondary | ICD-10-CM

## 2014-02-21 DIAGNOSIS — G8918 Other acute postprocedural pain: Secondary | ICD-10-CM | POA: Diagnosis not present

## 2014-02-21 DIAGNOSIS — M1710 Unilateral primary osteoarthritis, unspecified knee: Secondary | ICD-10-CM | POA: Diagnosis not present

## 2014-02-21 DIAGNOSIS — R002 Palpitations: Secondary | ICD-10-CM | POA: Diagnosis present

## 2014-02-21 DIAGNOSIS — Z8049 Family history of malignant neoplasm of other genital organs: Secondary | ICD-10-CM | POA: Diagnosis not present

## 2014-02-21 DIAGNOSIS — K219 Gastro-esophageal reflux disease without esophagitis: Secondary | ICD-10-CM | POA: Diagnosis present

## 2014-02-21 DIAGNOSIS — Z96649 Presence of unspecified artificial hip joint: Secondary | ICD-10-CM | POA: Diagnosis not present

## 2014-02-21 DIAGNOSIS — IMO0002 Reserved for concepts with insufficient information to code with codable children: Secondary | ICD-10-CM | POA: Diagnosis not present

## 2014-02-21 DIAGNOSIS — E039 Hypothyroidism, unspecified: Secondary | ICD-10-CM | POA: Diagnosis present

## 2014-02-21 DIAGNOSIS — M1712 Unilateral primary osteoarthritis, left knee: Secondary | ICD-10-CM

## 2014-02-21 DIAGNOSIS — M48061 Spinal stenosis, lumbar region without neurogenic claudication: Secondary | ICD-10-CM | POA: Diagnosis present

## 2014-02-21 DIAGNOSIS — I493 Ventricular premature depolarization: Secondary | ICD-10-CM | POA: Diagnosis present

## 2014-02-21 DIAGNOSIS — M25569 Pain in unspecified knee: Secondary | ICD-10-CM | POA: Diagnosis not present

## 2014-02-21 DIAGNOSIS — Z87891 Personal history of nicotine dependence: Secondary | ICD-10-CM | POA: Diagnosis not present

## 2014-02-21 DIAGNOSIS — M179 Osteoarthritis of knee, unspecified: Secondary | ICD-10-CM | POA: Diagnosis present

## 2014-02-21 DIAGNOSIS — Z885 Allergy status to narcotic agent status: Secondary | ICD-10-CM | POA: Diagnosis not present

## 2014-02-21 DIAGNOSIS — E119 Type 2 diabetes mellitus without complications: Secondary | ICD-10-CM | POA: Diagnosis not present

## 2014-02-21 DIAGNOSIS — Z833 Family history of diabetes mellitus: Secondary | ICD-10-CM | POA: Diagnosis not present

## 2014-02-21 DIAGNOSIS — Z8249 Family history of ischemic heart disease and other diseases of the circulatory system: Secondary | ICD-10-CM

## 2014-02-21 DIAGNOSIS — E785 Hyperlipidemia, unspecified: Secondary | ICD-10-CM | POA: Diagnosis present

## 2014-02-21 HISTORY — PX: TOTAL KNEE ARTHROPLASTY: SHX125

## 2014-02-21 LAB — GLUCOSE, CAPILLARY
Glucose-Capillary: 118 mg/dL — ABNORMAL HIGH (ref 70–99)
Glucose-Capillary: 141 mg/dL — ABNORMAL HIGH (ref 70–99)
Glucose-Capillary: 160 mg/dL — ABNORMAL HIGH (ref 70–99)

## 2014-02-21 SURGERY — ARTHROPLASTY, KNEE, TOTAL
Anesthesia: General | Site: Knee | Laterality: Left

## 2014-02-21 MED ORDER — DOCUSATE SODIUM 100 MG PO CAPS
100.0000 mg | ORAL_CAPSULE | Freq: Two times a day (BID) | ORAL | Status: DC
  Administered 2014-02-21: 100 mg via ORAL
  Filled 2014-02-21 (×3): qty 1

## 2014-02-21 MED ORDER — NEOSTIGMINE METHYLSULFATE 1 MG/ML IJ SOLN
INTRAMUSCULAR | Status: DC | PRN
  Administered 2014-02-21: 3 mg via INTRAVENOUS

## 2014-02-21 MED ORDER — ONDANSETRON HCL 4 MG PO TABS
4.0000 mg | ORAL_TABLET | Freq: Four times a day (QID) | ORAL | Status: DC | PRN
  Filled 2014-02-21 (×2): qty 1

## 2014-02-21 MED ORDER — INSULIN ASPART 100 UNIT/ML ~~LOC~~ SOLN: 0.0000 [IU] | Freq: Every day | SUBCUTANEOUS | Status: DC

## 2014-02-21 MED ORDER — SODIUM CHLORIDE 0.9 % IR SOLN
Status: DC | PRN
  Administered 2014-02-21: 3000 mL

## 2014-02-21 MED ORDER — METHOCARBAMOL 500 MG PO TABS
ORAL_TABLET | ORAL | Status: AC
  Administered 2014-02-21: 500 mg
  Filled 2014-02-21: qty 1

## 2014-02-21 MED ORDER — PROPOFOL 10 MG/ML IV BOLUS
INTRAVENOUS | Status: AC
  Filled 2014-02-21: qty 20

## 2014-02-21 MED ORDER — METOCLOPRAMIDE HCL 5 MG/ML IJ SOLN
5.0000 mg | Freq: Three times a day (TID) | INTRAMUSCULAR | Status: DC | PRN
  Administered 2014-02-22: 10 mg via INTRAVENOUS
  Filled 2014-02-21 (×2): qty 2

## 2014-02-21 MED ORDER — FENTANYL CITRATE 0.05 MG/ML IJ SOLN
INTRAMUSCULAR | Status: AC
  Administered 2014-02-21: 100 ug via INTRAVENOUS
  Filled 2014-02-21: qty 2

## 2014-02-21 MED ORDER — FENOFIBRATE 160 MG PO TABS
160.0000 mg | ORAL_TABLET | Freq: Every day | ORAL | Status: DC
  Administered 2014-02-21: 160 mg via ORAL
  Filled 2014-02-21 (×2): qty 1

## 2014-02-21 MED ORDER — BUPIVACAINE-EPINEPHRINE 0.25% -1:200000 IJ SOLN
INTRAMUSCULAR | Status: DC | PRN
  Administered 2014-02-21: 30 mL

## 2014-02-21 MED ORDER — MIDAZOLAM HCL 2 MG/2ML IJ SOLN
INTRAMUSCULAR | Status: AC
  Administered 2014-02-21: 2 mg via INTRAVENOUS
  Filled 2014-02-21: qty 2

## 2014-02-21 MED ORDER — ONDANSETRON HCL 4 MG/2ML IJ SOLN
INTRAMUSCULAR | Status: AC
  Filled 2014-02-21: qty 2

## 2014-02-21 MED ORDER — FENTANYL CITRATE 0.05 MG/ML IJ SOLN
INTRAMUSCULAR | Status: DC | PRN
  Administered 2014-02-21 (×2): 50 ug via INTRAVENOUS
  Administered 2014-02-21 (×2): 100 ug via INTRAVENOUS
  Administered 2014-02-21: 50 ug via INTRAVENOUS

## 2014-02-21 MED ORDER — HYDROMORPHONE HCL PF 1 MG/ML IJ SOLN
1.0000 mg | INTRAMUSCULAR | Status: DC | PRN
  Administered 2014-02-21 – 2014-02-22 (×3): 1 mg via INTRAVENOUS
  Filled 2014-02-21 (×3): qty 1

## 2014-02-21 MED ORDER — DEXAMETHASONE 6 MG PO TABS
10.0000 mg | ORAL_TABLET | Freq: Three times a day (TID) | ORAL | Status: AC
  Administered 2014-02-22: 10 mg via ORAL
  Filled 2014-02-21 (×3): qty 1

## 2014-02-21 MED ORDER — RIVAROXABAN 10 MG PO TABS
10.0000 mg | ORAL_TABLET | Freq: Every day | ORAL | Status: DC
  Administered 2014-02-22: 10 mg via ORAL
  Filled 2014-02-21 (×2): qty 1

## 2014-02-21 MED ORDER — PANTOPRAZOLE SODIUM 40 MG PO TBEC
40.0000 mg | DELAYED_RELEASE_TABLET | Freq: Every day | ORAL | Status: DC
Start: 2014-02-22 — End: 2014-02-22
  Administered 2014-02-22: 40 mg via ORAL
  Filled 2014-02-21 (×2): qty 1

## 2014-02-21 MED ORDER — SODIUM CHLORIDE 0.9 % IJ SOLN
INTRAMUSCULAR | Status: AC
  Filled 2014-02-21: qty 10

## 2014-02-21 MED ORDER — GLYCOPYRROLATE 0.2 MG/ML IJ SOLN
INTRAMUSCULAR | Status: AC
  Filled 2014-02-21: qty 2

## 2014-02-21 MED ORDER — 0.9 % SODIUM CHLORIDE (POUR BTL) OPTIME
TOPICAL | Status: DC | PRN
  Administered 2014-02-21: 1000 mL

## 2014-02-21 MED ORDER — DEXAMETHASONE SODIUM PHOSPHATE 10 MG/ML IJ SOLN
10.0000 mg | Freq: Three times a day (TID) | INTRAMUSCULAR | Status: AC
  Administered 2014-02-21 – 2014-02-22 (×2): 10 mg via INTRAVENOUS
  Filled 2014-02-21 (×3): qty 1

## 2014-02-21 MED ORDER — LIDOCAINE HCL (CARDIAC) 20 MG/ML IV SOLN
INTRAVENOUS | Status: DC | PRN
  Administered 2014-02-21: 100 mg via INTRAVENOUS

## 2014-02-21 MED ORDER — FENTANYL CITRATE 0.05 MG/ML IJ SOLN
INTRAMUSCULAR | Status: AC
Start: 2014-02-21 — End: 2014-02-21
  Filled 2014-02-21: qty 5

## 2014-02-21 MED ORDER — CEFAZOLIN SODIUM-DEXTROSE 2-3 GM-% IV SOLR
INTRAVENOUS | Status: AC
  Administered 2014-02-21: 2 g via INTRAVENOUS
  Filled 2014-02-21: qty 50

## 2014-02-21 MED ORDER — BUPIVACAINE-EPINEPHRINE PF 0.5-1:200000 % IJ SOLN
INTRAMUSCULAR | Status: DC | PRN
  Administered 2014-02-21: 30 mL via PERINEURAL

## 2014-02-21 MED ORDER — CEFAZOLIN SODIUM-DEXTROSE 2-3 GM-% IV SOLR
2.0000 g | Freq: Four times a day (QID) | INTRAVENOUS | Status: AC
  Administered 2014-02-21 (×2): 2 g via INTRAVENOUS
  Filled 2014-02-21 (×2): qty 50

## 2014-02-21 MED ORDER — LIDOCAINE HCL (CARDIAC) 20 MG/ML IV SOLN
INTRAVENOUS | Status: AC
  Filled 2014-02-21: qty 5

## 2014-02-21 MED ORDER — DILTIAZEM HCL ER COATED BEADS 360 MG PO TB24
360.0000 mg | ORAL_TABLET | Freq: Every day | ORAL | Status: DC
  Administered 2014-02-22: 360 mg via ORAL
  Filled 2014-02-21: qty 1

## 2014-02-21 MED ORDER — GLYCOPYRROLATE 0.2 MG/ML IJ SOLN
INTRAMUSCULAR | Status: DC | PRN
  Administered 2014-02-21: 0.4 mg via INTRAVENOUS

## 2014-02-21 MED ORDER — PHENOL 1.4 % MT LIQD: 1.0000 | OROMUCOSAL | Status: DC | PRN

## 2014-02-21 MED ORDER — HYDROMORPHONE HCL PF 1 MG/ML IJ SOLN: 0.2500 mg | INTRAMUSCULAR | Status: DC | PRN

## 2014-02-21 MED ORDER — OXYCODONE HCL 5 MG/5ML PO SOLN: 5.0000 mg | Freq: Once | ORAL | Status: DC | PRN

## 2014-02-21 MED ORDER — ACETAMINOPHEN 650 MG RE SUPP: 650.0000 mg | Freq: Four times a day (QID) | RECTAL | Status: DC | PRN

## 2014-02-21 MED ORDER — DIPHENHYDRAMINE HCL 12.5 MG/5ML PO ELIX: 12.5000 mg | ORAL_SOLUTION | ORAL | Status: DC | PRN

## 2014-02-21 MED ORDER — ONDANSETRON HCL 4 MG/2ML IJ SOLN
INTRAMUSCULAR | Status: DC | PRN
  Administered 2014-02-21: 4 mg via INTRAVENOUS

## 2014-02-21 MED ORDER — ONDANSETRON HCL 4 MG/2ML IJ SOLN
4.0000 mg | Freq: Four times a day (QID) | INTRAMUSCULAR | Status: DC | PRN
  Administered 2014-02-22: 4 mg via INTRAVENOUS
  Filled 2014-02-21 (×2): qty 2

## 2014-02-21 MED ORDER — FENTANYL CITRATE 0.05 MG/ML IJ SOLN
INTRAMUSCULAR | Status: AC
  Filled 2014-02-21: qty 5

## 2014-02-21 MED ORDER — EPHEDRINE SULFATE 50 MG/ML IJ SOLN
INTRAMUSCULAR | Status: AC
  Filled 2014-02-21: qty 1

## 2014-02-21 MED ORDER — POTASSIUM CHLORIDE IN NACL 20-0.9 MEQ/L-% IV SOLN
INTRAVENOUS | Status: DC
  Filled 2014-02-21 (×4): qty 1000

## 2014-02-21 MED ORDER — DEXAMETHASONE SODIUM PHOSPHATE 10 MG/ML IJ SOLN
INTRAMUSCULAR | Status: AC
  Filled 2014-02-21: qty 1

## 2014-02-21 MED ORDER — OXYCODONE HCL 5 MG PO TABS
ORAL_TABLET | ORAL | Status: AC
  Filled 2014-02-21: qty 1

## 2014-02-21 MED ORDER — PHENYLEPHRINE 40 MCG/ML (10ML) SYRINGE FOR IV PUSH (FOR BLOOD PRESSURE SUPPORT)
PREFILLED_SYRINGE | INTRAVENOUS | Status: AC
  Filled 2014-02-21: qty 10

## 2014-02-21 MED ORDER — LACTATED RINGERS IV SOLN
INTRAVENOUS | Status: DC
  Administered 2014-02-21: 10:00:00 via INTRAVENOUS

## 2014-02-21 MED ORDER — ROCURONIUM BROMIDE 100 MG/10ML IV SOLN
INTRAVENOUS | Status: DC | PRN
  Administered 2014-02-21: 20 mg via INTRAVENOUS

## 2014-02-21 MED ORDER — FENTANYL CITRATE 0.05 MG/ML IJ SOLN
50.0000 ug | INTRAMUSCULAR | Status: DC | PRN
  Administered 2014-02-21: 100 ug via INTRAVENOUS

## 2014-02-21 MED ORDER — BUPIVACAINE-EPINEPHRINE (PF) 0.25% -1:200000 IJ SOLN
INTRAMUSCULAR | Status: AC
  Filled 2014-02-21: qty 30

## 2014-02-21 MED ORDER — SUCCINYLCHOLINE CHLORIDE 20 MG/ML IJ SOLN
INTRAMUSCULAR | Status: AC
  Filled 2014-02-21: qty 1

## 2014-02-21 MED ORDER — ALUM & MAG HYDROXIDE-SIMETH 200-200-20 MG/5ML PO SUSP: 30.0000 mL | ORAL | Status: DC | PRN

## 2014-02-21 MED ORDER — SUCCINYLCHOLINE CHLORIDE 20 MG/ML IJ SOLN
INTRAMUSCULAR | Status: DC | PRN
  Administered 2014-02-21: 140 mg via INTRAVENOUS

## 2014-02-21 MED ORDER — MEPERIDINE HCL 25 MG/ML IJ SOLN: 6.2500 mg | INTRAMUSCULAR | Status: DC | PRN

## 2014-02-21 MED ORDER — LEVOTHYROXINE SODIUM 75 MCG PO TABS
37.5000 ug | ORAL_TABLET | Freq: Every day | ORAL | Status: DC
  Administered 2014-02-22: 37.5 ug via ORAL
  Filled 2014-02-21 (×2): qty 0.5

## 2014-02-21 MED ORDER — OXYCODONE HCL 5 MG PO TABS: 5.0000 mg | ORAL_TABLET | Freq: Once | ORAL | Status: DC | PRN

## 2014-02-21 MED ORDER — ACETAMINOPHEN 325 MG PO TABS: 650.0000 mg | ORAL_TABLET | Freq: Four times a day (QID) | ORAL | Status: DC | PRN

## 2014-02-21 MED ORDER — METOCLOPRAMIDE HCL 5 MG PO TABS
5.0000 mg | ORAL_TABLET | Freq: Three times a day (TID) | ORAL | Status: DC | PRN
  Filled 2014-02-21: qty 2

## 2014-02-21 MED ORDER — ONDANSETRON HCL 4 MG/2ML IJ SOLN: 4.0000 mg | Freq: Once | INTRAMUSCULAR | Status: DC | PRN

## 2014-02-21 MED ORDER — PHENYLEPHRINE HCL 10 MG/ML IJ SOLN
INTRAMUSCULAR | Status: DC | PRN
  Administered 2014-02-21: 80 ug via INTRAVENOUS
  Administered 2014-02-21 (×2): 40 ug via INTRAVENOUS

## 2014-02-21 MED ORDER — OXYCODONE HCL 5 MG PO TABS
5.0000 mg | ORAL_TABLET | ORAL | Status: DC | PRN
  Administered 2014-02-21: 5 mg via ORAL
  Administered 2014-02-21 – 2014-02-22 (×2): 10 mg via ORAL
  Filled 2014-02-21 (×2): qty 2

## 2014-02-21 MED ORDER — BISACODYL 5 MG PO TBEC
10.0000 mg | DELAYED_RELEASE_TABLET | Freq: Every day | ORAL | Status: DC
  Administered 2014-02-21: 10 mg via ORAL
  Filled 2014-02-21: qty 2

## 2014-02-21 MED ORDER — MENTHOL 3 MG MT LOZG
1.0000 | LOZENGE | OROMUCOSAL | Status: DC | PRN
  Filled 2014-02-21 (×2): qty 9

## 2014-02-21 MED ORDER — CELECOXIB 200 MG PO CAPS
200.0000 mg | ORAL_CAPSULE | Freq: Two times a day (BID) | ORAL | Status: DC
  Administered 2014-02-21: 200 mg via ORAL
  Filled 2014-02-21 (×3): qty 1

## 2014-02-21 MED ORDER — INSULIN ASPART 100 UNIT/ML ~~LOC~~ SOLN
0.0000 [IU] | Freq: Three times a day (TID) | SUBCUTANEOUS | Status: DC
  Administered 2014-02-21: 2 [IU] via SUBCUTANEOUS
  Administered 2014-02-22 (×2): 3 [IU] via SUBCUTANEOUS

## 2014-02-21 MED ORDER — LACTATED RINGERS IV SOLN
INTRAVENOUS | Status: DC | PRN
  Administered 2014-02-21 (×2): via INTRAVENOUS

## 2014-02-21 MED ORDER — VITAMIN D 1000 UNITS PO TABS
2000.0000 [IU] | ORAL_TABLET | Freq: Every day | ORAL | Status: DC
  Administered 2014-02-21: 2000 [IU] via ORAL
  Filled 2014-02-21 (×2): qty 2

## 2014-02-21 MED ORDER — PROPOFOL 10 MG/ML IV BOLUS
INTRAVENOUS | Status: DC | PRN
  Administered 2014-02-21: 70 mg via INTRAVENOUS
  Administered 2014-02-21: 40 mg via INTRAVENOUS
  Administered 2014-02-21: 30 mg via INTRAVENOUS
  Administered 2014-02-21: 130 mg via INTRAVENOUS

## 2014-02-21 MED ORDER — NEOSTIGMINE METHYLSULFATE 1 MG/ML IJ SOLN
INTRAMUSCULAR | Status: AC
  Filled 2014-02-21: qty 10

## 2014-02-21 MED ORDER — MIDAZOLAM HCL 2 MG/2ML IJ SOLN
1.0000 mg | INTRAMUSCULAR | Status: DC | PRN
  Administered 2014-02-21: 2 mg via INTRAVENOUS

## 2014-02-21 SURGICAL SUPPLY — 67 items
BANDAGE ESMARK 6X9 LF (GAUZE/BANDAGES/DRESSINGS) ×1 IMPLANT
BLADE SAGITTAL 25.0X1.19X90 (BLADE) ×2 IMPLANT
BLADE SAW SGTL 11.0X1.19X90.0M (BLADE) IMPLANT
BLADE SAW SGTL 13.0X1.19X90.0M (BLADE) ×2 IMPLANT
BLADE SURG 10 STRL SS (BLADE) ×4 IMPLANT
BNDG ELASTIC 6X15 VLCR STRL LF (GAUZE/BANDAGES/DRESSINGS) ×2 IMPLANT
BNDG ESMARK 6X9 LF (GAUZE/BANDAGES/DRESSINGS) ×2
BOWL SMART MIX CTS (DISPOSABLE) ×2 IMPLANT
CAPT RP KNEE ×2 IMPLANT
CEMENT HV SMART SET (Cement) ×4 IMPLANT
CLSR STERI-STRIP ANTIMIC 1/2X4 (GAUZE/BANDAGES/DRESSINGS) ×2 IMPLANT
COVER SURGICAL LIGHT HANDLE (MISCELLANEOUS) ×2 IMPLANT
CUFF TOURNIQUET SINGLE 34IN LL (TOURNIQUET CUFF) ×2 IMPLANT
CUFF TOURNIQUET SINGLE 44IN (TOURNIQUET CUFF) IMPLANT
DRAPE EXTREMITY T 121X128X90 (DRAPE) ×2 IMPLANT
DRAPE INCISE IOBAN 66X45 STRL (DRAPES) ×2 IMPLANT
DRAPE PROXIMA HALF (DRAPES) ×2 IMPLANT
DRAPE U-SHAPE 47X51 STRL (DRAPES) ×2 IMPLANT
DRSG ADAPTIC 3X8 NADH LF (GAUZE/BANDAGES/DRESSINGS) ×2 IMPLANT
DRSG PAD ABDOMINAL 8X10 ST (GAUZE/BANDAGES/DRESSINGS) ×4 IMPLANT
DURAPREP 26ML APPLICATOR (WOUND CARE) ×4 IMPLANT
ELECT CAUTERY BLADE 6.4 (BLADE) ×2 IMPLANT
ELECT REM PT RETURN 9FT ADLT (ELECTROSURGICAL) ×2
ELECTRODE REM PT RTRN 9FT ADLT (ELECTROSURGICAL) ×1 IMPLANT
EVACUATOR 1/8 PVC DRAIN (DRAIN) ×2 IMPLANT
FACESHIELD LNG OPTICON STERILE (SAFETY) ×2 IMPLANT
GLOVE BIO SURGEON STRL SZ7 (GLOVE) ×2 IMPLANT
GLOVE BIOGEL PI IND STRL 7.0 (GLOVE) ×2 IMPLANT
GLOVE BIOGEL PI IND STRL 7.5 (GLOVE) ×1 IMPLANT
GLOVE BIOGEL PI INDICATOR 7.0 (GLOVE) ×2
GLOVE BIOGEL PI INDICATOR 7.5 (GLOVE) ×1
GLOVE SS BIOGEL STRL SZ 7.5 (GLOVE) ×1 IMPLANT
GLOVE SUPERSENSE BIOGEL SZ 7.5 (GLOVE) ×1
GLOVE SURG SS PI 7.0 STRL IVOR (GLOVE) ×2 IMPLANT
GOWN STRL REUS W/ TWL LRG LVL3 (GOWN DISPOSABLE) ×2 IMPLANT
GOWN STRL REUS W/ TWL XL LVL3 (GOWN DISPOSABLE) ×2 IMPLANT
GOWN STRL REUS W/TWL LRG LVL3 (GOWN DISPOSABLE) ×2
GOWN STRL REUS W/TWL XL LVL3 (GOWN DISPOSABLE) ×2
HANDPIECE INTERPULSE COAX TIP (DISPOSABLE) ×1
HOOD PEEL AWAY FACE SHEILD DIS (HOOD) ×4 IMPLANT
IMMOBILIZER KNEE 22 UNIV (SOFTGOODS) ×2 IMPLANT
KIT BASIN OR (CUSTOM PROCEDURE TRAY) ×2 IMPLANT
KIT ROOM TURNOVER OR (KITS) ×2 IMPLANT
MANIFOLD NEPTUNE II (INSTRUMENTS) ×2 IMPLANT
NS IRRIG 1000ML POUR BTL (IV SOLUTION) ×2 IMPLANT
PACK TOTAL JOINT (CUSTOM PROCEDURE TRAY) ×2 IMPLANT
PAD ABD 8X10 STRL (GAUZE/BANDAGES/DRESSINGS) ×4 IMPLANT
PAD ARMBOARD 7.5X6 YLW CONV (MISCELLANEOUS) ×4 IMPLANT
PAD CAST 4YDX4 CTTN HI CHSV (CAST SUPPLIES) ×1 IMPLANT
PADDING CAST COTTON 4X4 STRL (CAST SUPPLIES) ×1
PADDING CAST COTTON 6X4 STRL (CAST SUPPLIES) ×2 IMPLANT
RUBBERBAND STERILE (MISCELLANEOUS) ×2 IMPLANT
SET HNDPC FAN SPRY TIP SCT (DISPOSABLE) ×1 IMPLANT
SPONGE GAUZE 4X4 12PLY (GAUZE/BANDAGES/DRESSINGS) ×2 IMPLANT
STRIP CLOSURE SKIN 1/2X4 (GAUZE/BANDAGES/DRESSINGS) ×2 IMPLANT
SUCTION FRAZIER TIP 10 FR DISP (SUCTIONS) ×2 IMPLANT
SUT ETHIBOND NAB CT1 #1 30IN (SUTURE) ×4 IMPLANT
SUT MNCRL AB 3-0 PS2 18 (SUTURE) ×2 IMPLANT
SUT VIC AB 0 CT1 27 (SUTURE) ×2
SUT VIC AB 0 CT1 27XBRD ANBCTR (SUTURE) ×2 IMPLANT
SUT VIC AB 2-0 CT1 27 (SUTURE) ×2
SUT VIC AB 2-0 CT1 TAPERPNT 27 (SUTURE) ×2 IMPLANT
SYR 30ML SLIP (SYRINGE) ×2 IMPLANT
TOWEL OR 17X24 6PK STRL BLUE (TOWEL DISPOSABLE) ×2 IMPLANT
TOWEL OR 17X26 10 PK STRL BLUE (TOWEL DISPOSABLE) ×2 IMPLANT
TRAY FOLEY CATH 16FR SILVER (SET/KITS/TRAYS/PACK) ×2 IMPLANT
WATER STERILE IRR 1000ML POUR (IV SOLUTION) ×4 IMPLANT

## 2014-02-21 NOTE — Anesthesia Procedure Notes (Addendum)
Anesthesia Regional Block:  Adductor canal block  Pre-Anesthetic Checklist: ,, timeout performed, Correct Patient, Correct Site, Correct Laterality, Correct Procedure, Correct Position, site marked, Risks and benefits discussed,  Surgical consent,  Pre-op evaluation,  At surgeon's request and post-op pain management  Laterality: Left  Prep: chloraprep       Needles:  Injection technique: Single-shot  Needle Type: Echogenic Stimulator Needle     Needle Length: 9cm 9 cm Needle Gauge: 21 and 21 G    Additional Needles:  Procedures: ultrasound guided (picture in chart) Adductor canal block Narrative:  Start time: 02/21/2014 9:10 AM End time: 02/21/2014 9:25 AM Injection made incrementally with aspirations every 5 mL.  Performed by: Personally  Anesthesiologist: Lillia Abed MD  Additional Notes: Monitors applied. Patient sedated. Sterile prep and drape,hand hygiene and sterile gloves were used. Relevant anatomy identified.Needle position confirmed.Local anesthetic injected incrementally after negative aspiration. Local anesthetic spread visualized around nerve(s). Vascular puncture avoided. No complications. Image printed for medical record.The patient tolerated the procedure well.    Lillia Abed MD   Procedure Name: Intubation Date/Time: 02/21/2014 11:22 AM Performed by: Jenne Campus Pre-anesthesia Checklist: Patient identified, Emergency Drugs available, Suction available, Patient being monitored and Timeout performed Patient Re-evaluated:Patient Re-evaluated prior to inductionOxygen Delivery Method: Circle system utilized Preoxygenation: Pre-oxygenation with 100% oxygen Intubation Type: IV induction Ventilation: Mask ventilation without difficulty Laryngoscope Size: Miller and 2 Grade View: Grade I Tube type: Oral Tube size: 7.0 mm Number of attempts: 1 Airway Equipment and Method: Stylet Placement Confirmation: ETT inserted through vocal cords under direct vision,   positive ETCO2,  CO2 detector and breath sounds checked- equal and bilateral Secured at: 20 cm Tube secured with: Tape Dental Injury: Teeth and Oropharynx as per pre-operative assessment  Comments: Smooth IV induction. EZ mask. LMA #4 placed by Dr. Conrad [place]. Unable to get consistent +ETCO2. TV < 200. LMA removed and patient intubated per note. bbse.

## 2014-02-21 NOTE — Transfer of Care (Signed)
Immediate Anesthesia Transfer of Care Note  Patient: Tricia Harrison  Procedure(s) Performed: Procedure(s): TOTAL KNEE ARTHROPLASTY (Left)  Patient Location: PACU  Anesthesia Type:General  Level of Consciousness: awake, oriented and patient cooperative  Airway & Oxygen Therapy: Patient Spontanous Breathing and Patient connected to nasal cannula oxygen  Post-op Assessment: Report given to PACU RN and Post -op Vital signs reviewed and stable  Post vital signs: Reviewed  Complications: No apparent anesthesia complications

## 2014-02-21 NOTE — H&P (View-Only) (Signed)
TOTAL KNEE ADMISSION H&P  Patient is being admitted for left total knee arthroplasty.  Subjective:  Chief Complaint:left knee pain.  HPI: Tricia Harrison, 68 y.o. female, has a history of pain and functional disability in the left knee due to arthritis and has failed non-surgical conservative treatments for greater than 12 weeks to includeNSAID's and/or analgesics, corticosteriod injections, viscosupplementation injections, flexibility and strengthening excercises, supervised PT with diminished ADL's post treatment, use of assistive devices, weight reduction as appropriate and activity modification.  Onset of symptoms was gradual, starting 10 years ago with gradually worsening course since that time. The patient noted no past surgery on the left knee(s).  Patient currently rates pain in the left knee(s) at 10 out of 10 with activity. Patient has night pain, worsening of pain with activity and weight bearing, pain that interferes with activities of daily living, crepitus and joint swelling.  Patient has evidence of subchondral sclerosis, periarticular osteophytes and joint space narrowing by imaging studies.  There is no active infection.  Patient Active Problem List   Diagnosis Date Noted  . Bilateral ovarian cysts   . Hx of gallstones   . Hernia, hiatal   . Renal cysts, acquired, bilateral   . GERD (gastroesophageal reflux disease)   . Diverticulosis   . Lumbar spinal stenosis   . Diabetes mellitus without complication   . Left knee DJD   . Palpitations 12/06/2013  . Morbid obesity 12/06/2013  . Fatigue 12/06/2013  . PVC (premature ventricular contraction) 12/06/2013  . HTN (hypertension)   . Hypothyroidism   . HLD (hyperlipidemia)    Past Medical History  Diagnosis Date  . HTN (hypertension)   . Hypothyroidism   . Bilateral ovarian cysts   . Hx of gallstones   . Hernia, hiatal   . Renal cysts, acquired, bilateral   . GERD (gastroesophageal reflux disease)   . Macular  degeneration, left eye   . HLD (hyperlipidemia)   . Diverticulosis   . Lumbar spinal stenosis   . Diabetes mellitus without complication   . Left knee DJD     Past Surgical History  Procedure Laterality Date  . Vaginal hysterectomy    . Total hip arthroplasty Left   . Thyroid surgery       (Not in a hospital admission) Allergies  Allergen Reactions  . Codeine Nausea And Vomiting    Current Outpatient Prescriptions on File Prior to Visit  Medication Sig Dispense Refill  . Cholecalciferol (VITAMIN D3) 2000 UNITS TABS Take 2,000 mg by mouth daily.      Marland Kitchen diltiazem (CARDIZEM LA) 360 MG 24 hr tablet Take 360 mg by mouth daily.      . fenofibrate (TRICOR) 145 MG tablet Take 145 mg by mouth daily.      Marland Kitchen levothyroxine (SYNTHROID, LEVOTHROID) 75 MCG tablet Take 75 mcg by mouth daily before breakfast.      . ramipril (ALTACE) 10 MG capsule Take 10 mg by mouth daily.      . traMADol (ULTRAM) 50 MG tablet Take 50 mg by mouth every 6 (six) hours as needed.       No current facility-administered medications on file prior to visit.    History  Substance Use Topics  . Smoking status: Former Research scientist (life sciences)  . Smokeless tobacco: Not on file  . Alcohol Use: 1.8 oz/week    3 Glasses of wine per week     Comment: wine 2-3 times a week    Family History  Problem Relation Age of  Onset  . Uterine cancer Mother   . Heart disease Mother   . Hypertension Mother   . Diabetes Mother   . Cirrhosis Father   . Pneumonia Father   . Lymphoma Sister     ,non hodgkin   . Diabetes Sister      Review of Systems  Constitutional: Negative.   HENT: Negative.   Eyes: Negative.   Respiratory: Negative.   Cardiovascular: Negative.   Gastrointestinal: Negative.   Genitourinary: Negative.   Musculoskeletal: Positive for back pain and joint pain.       Left knee pain and low back pain  Skin: Negative.   Neurological: Negative.   Endo/Heme/Allergies: Negative.   Psychiatric/Behavioral: Negative.      Objective:  Physical Exam  Constitutional: She is oriented to person, place, and time. She appears well-developed and well-nourished.  HENT:  Head: Normocephalic and atraumatic.  Mouth/Throat: Oropharynx is clear and moist.  Eyes: Conjunctivae and EOM are normal. Pupils are equal, round, and reactive to light.  Neck: Neck supple.  Cardiovascular: Normal rate and intact distal pulses.  Exam reveals no gallop and no friction rub.   Murmur heard. Respiratory: Effort normal and breath sounds normal. No respiratory distress. She has no wheezes. She has no rales. She exhibits no tenderness.  GI: Soft. Bowel sounds are normal. She exhibits no distension and no mass. There is no tenderness. There is no rebound and no guarding.  Genitourinary:  Not pertinent to current symptomatology therefore not examined.  Musculoskeletal:  Examination of her left knee reveals valgus deformity pain medially and laterally, 1+ synovitis 1+ crepitation diffuse pain, range of motion from -5 to 125 degrees with normal patella tracking. Exam of the right knee reveals mild pain 1+ synovitis full range of motion knee is stable with normal patella tracking. Vascular exam: pulses 2+ and symmetric. She does have significant weakness trying to flex her hip and bend her left knee and has difficulty getting in and out of a car because of this.   Neurological: She is alert and oriented to person, place, and time.  Skin: Skin is warm and dry.  Psychiatric: She has a normal mood and affect. Her behavior is normal.    Vital signs in last 24 hours: Last recorded: 03/10 1300   BP: 119/76 Pulse: 85  Temp: 97.5 F (36.4 C)    Height: 5' (1.524 m) SpO2: 99  Weight: 93.441 kg (206 lb)     Labs:   Estimated body mass index is 40.23 kg/(m^2) as calculated from the following:   Height as of this encounter: 5' (1.524 m).   Weight as of this encounter: 93.441 kg (206 lb).   Imaging Review Plain radiographs demonstrate  severe degenerative joint disease of the left knee(s). The overall alignment issignificant valgus. The bone quality appears to be good for age and reported activity level.  Assessment/Plan:  End stage arthritis, left knee   The patient history, physical examination, clinical judgment of the provider and imaging studies are consistent with end stage degenerative joint disease of the left knee(s) and total knee arthroplasty is deemed medically necessary. The treatment options including medical management, injection therapy arthroscopy and arthroplasty were discussed at length. The risks and benefits of total knee arthroplasty were presented and reviewed. The risks due to aseptic loosening, infection, stiffness, patella tracking problems, thromboembolic complications and other imponderables were discussed. The patient acknowledged the explanation, agreed to proceed with the plan and consent was signed. Patient is being admitted for  inpatient treatment for surgery, pain control, PT, OT, prophylactic antibiotics, VTE prophylaxis, progressive ambulation and ADL's and discharge planning. The patient is planning to be discharged home with home health services  Collin Rengel A. Kaleen Mask Physician Assistant [facility] [phone]  02/01/2014, 2:08 PM

## 2014-02-21 NOTE — Progress Notes (Signed)
Utilization review completed.  

## 2014-02-21 NOTE — Interval H&P Note (Signed)
History and Physical Interval Note:  02/21/2014 10:59 AM  Tricia Harrison  has presented today for surgery, with the diagnosis of DJD left knee  The various methods of treatment have been discussed with the patient and family. After consideration of risks, benefits and other options for treatment, the patient has consented to  Procedure(s): TOTAL KNEE ARTHROPLASTY (Left) as a surgical intervention .  The patient's history has been reviewed, patient examined, no change in status, stable for surgery.  I have reviewed the patient's chart and labs.  Questions were answered to the patient's satisfaction.     Elsie Saas A

## 2014-02-21 NOTE — Evaluation (Signed)
Physical Therapy Evaluation Patient Details Name: Tricia Harrison MRN: [id number] DOB: 11/06/1946 Today's Date: 02/21/2014   History of Present Illness  Patient is a 68 yo female s/p Lt TKA.  Clinical Impression  Patient presents with problems listed below.  Will benefit from acute PT to maximize independence prior to discharge home with sister.    Follow Up Recommendations Home health PT;Supervision/Assistance - 24 hour    Equipment Recommendations  Wheelchair (measurements PT);Wheelchair cushion (measurements PT) (Patient requesting w/c for use in house and for longer dist.)    Recommendations for Other Services       Precautions / Restrictions Precautions Precautions: Knee;Fall Precaution Booklet Issued: Yes (comment) Precaution Comments: Reviewed precautions with patient and family (sister and patient's sons) Required Braces or Orthoses: Knee Immobilizer - Left Knee Immobilizer - Left: On when out of bed or walking Restrictions Weight Bearing Restrictions: Yes LLE Weight Bearing: Weight bearing as tolerated      Mobility  Bed Mobility Overal bed mobility: Needs Assistance Bed Mobility: Supine to Sit     Supine to sit: Mod assist     General bed mobility comments: Instructed patient and sister on donning KI on LLE.  Verbal cues for bed mobility.  Assist to bring LLE off of bed, and to raise trunk to sitting position.  Once upright, patient able to maintain balance with min guard assist.  Patient sat EOB x 15 minutes - feeling hot and "weak".  Improved with time.  Transfers Overall transfer level: Needs assistance Equipment used: Rolling walker (2 wheeled) Transfers: Sit to/from [facility] Sit to Stand: Mod assist Stand pivot transfers: Mod assist       General transfer comment: Verbal cues for hand placement and technique.  Assist to rise to standing and for balance.  Patient able to take several hop-steps to pivot to chair.  Ambulation/Gait                Stairs            Wheelchair Mobility    Modified Rankin (Stroke Patients Only)       Balance                                     Pertinent Vitals/Pain     Home Living Family/patient expects to be discharged to:: Private residence Living Arrangements: Other relatives (sister) Available Help at Discharge: Family;Available 24 hours/day Type of Home: House Home Access: Stairs to enter Entrance Stairs-Rails: None Entrance Stairs-Number of Steps: 1 Home Layout: One level Home Equipment: Walker - 2 wheels;Bedside commode      Prior Function Level of Independence: Independent               Hand Dominance        Extremity/Trunk Assessment   Upper Extremity Assessment: Overall WFL for tasks assessed           Lower Extremity Assessment: LLE deficits/detail   LLE Deficits / Details: Decreased strength and ROM due to pain/surgery.  Patient reports Lt hip has been weak since hip surgery.     Communication   Communication: No difficulties  Cognition Arousal/Alertness: Awake/alert Behavior During Therapy: WFL for tasks assessed/performed Overall Cognitive Status: Within Functional Limits for tasks assessed                      General Comments      Exercises Total  Joint Exercises Ankle Circles/Pumps: AROM;Both;10 reps;Seated      Assessment/Plan    PT Assessment Patient needs continued PT services  PT Diagnosis Difficulty walking;Acute pain   PT Problem List Decreased strength;Decreased range of motion;Decreased activity tolerance;Decreased balance;Decreased mobility;Decreased knowledge of use of DME;Decreased knowledge of precautions;Obesity;Pain  PT Treatment Interventions DME instruction;Gait training;Stair training;Functional mobility training;Therapeutic exercise;Patient/family education   PT Goals (Current goals can be found in the Care Plan section) Acute Rehab PT Goals Patient Stated Goal: To  get stronger PT Goal Formulation: With patient/family Time For Goal Achievement: 02/28/14 Potential to Achieve Goals: Good    Frequency 7X/week   Barriers to discharge        End of Session Equipment Utilized During Treatment: Gait belt;Left knee immobilizer Activity Tolerance: Patient limited by pain;Patient limited by fatigue Patient left: in chair;with call bell/phone within reach;with family/visitor present         Time: 9833-8250 PT Time Calculation (min): 56 min   Charges:   PT Evaluation $Initial PT Evaluation Tier I: 1 Procedure PT Treatments $Therapeutic Activity: 23-37 mins   PT G Codes:          Despina Pole 02/21/2014, 7:34 PM Carita Pian. Sanjuana Kava, [place] Pager [phone]

## 2014-02-21 NOTE — Progress Notes (Signed)
Orthopedic Tech Progress Note Patient Details:  Tricia Harrison 11-Feb-1946 [id number]  CPM Left Knee CPM Left Knee: On Left Knee Flexion (Degrees): 60 Left Knee Extension (Degrees): 0 Additional Comments: Trapeze bar and foot roll   Irish Elders 02/21/2014, 2:16 PM

## 2014-02-21 NOTE — Anesthesia Postprocedure Evaluation (Signed)
Anesthesia Post Note  Patient: Tricia Harrison  Procedure(s) Performed: Procedure(s) (LRB): TOTAL KNEE ARTHROPLASTY (Left)  Anesthesia type: general  Patient location: PACU  Post pain: Pain level controlled  Post assessment: Patient's Cardiovascular Status Stable  Last Vitals:  Filed Vitals:   02/21/14 1600  BP:   Pulse:   Temp:   Resp: 16    Post vital signs: Reviewed and stable  Level of consciousness: sedated  Complications: No apparent anesthesia complications

## 2014-02-21 NOTE — Anesthesia Preprocedure Evaluation (Addendum)
Anesthesia Evaluation  Patient identified by MRN, date of birth, ID band Patient awake    Reviewed: Allergy & Precautions, H&P , NPO status , Patient's Chart, lab work & pertinent test results  History of Anesthesia Complications (+) PONV and history of anesthetic complications  Airway Mallampati: II TM Distance: >3 FB Neck ROM: Full    Dental  (+) Teeth Intact, Dental Advisory Given   Pulmonary asthma , former smoker,  breath sounds clear to auscultation        Cardiovascular hypertension, Pt. on medications Rhythm:Regular Rate:Normal     Neuro/Psych    GI/Hepatic Neg liver ROS, GERD-  Medicated and Controlled,  Endo/Other  diabetes, Type 2, Oral Hypoglycemic Agents  Renal/GU      Musculoskeletal   Abdominal   Peds  Hematology   Anesthesia Other Findings   Reproductive/Obstetrics                         Anesthesia Physical Anesthesia Plan  ASA: II  Anesthesia Plan: General   Post-op Pain Management:    Induction: Intravenous  Airway Management Planned: LMA  Additional Equipment:   Intra-op Plan:   Post-operative Plan: Extubation in OR  Informed Consent: I have reviewed the patients History and Physical, chart, labs and discussed the procedure including the risks, benefits and alternatives for the proposed anesthesia with the patient or authorized representative who has indicated his/her understanding and acceptance.     Plan Discussed with: CRNA and Surgeon  Anesthesia Plan Comments:         Anesthesia Quick Evaluation

## 2014-02-21 NOTE — Op Note (Signed)
MRN:     [id number] DOB/AGE:    68-May-1947 / 68 y.o.       OPERATIVE REPORT    DATE OF PROCEDURE:  02/21/2014       PREOPERATIVE DIAGNOSIS:   DJD left knee      Estimated body mass index is 41.07 kg/(m^2) as calculated from the following:   Height as of 02/14/14: 5' (1.524 m).   Weight as of this encounter: 95.397 kg (210 lb 5 oz).                                                        POSTOPERATIVE DIAGNOSIS:   DJD left knee                                                                      PROCEDURE:  Procedure(s): TOTAL KNEE ARTHROPLASTY Using Depuy Sigma RP implants #2 Femur, #2.5Tibia, 12.68mm sigma RP bearing, 32 Patella     SURGEON: Deondrea Markos A    ASSISTANT:  Kirstin Shepperson PA-C   (Present and scrubbed throughout the case, critical for assistance with exposure, retraction, instrumentation, and closure.)         ANESTHESIA: GET with Femoral Nerve Block  DRAINS: foley, 2 medium hemovac in knee   TOURNIQUET TIME: 29FAO   COMPLICATIONS:  None     SPECIMENS: None   INDICATIONS FOR PROCEDURE: The patient has  DJD left knee, varus deformities, XR shows bone on bone arthritis. Patient has failed all conservative measures including anti-inflammatory medicines, narcotics, attempts at  exercise and weight loss, cortisone injections and viscosupplementation.  Risks and benefits of surgery have been discussed, questions answered.   DESCRIPTION OF PROCEDURE: The patient identified by armband, received  right femoral nerve block and IV antibiotics, in the holding area at [facility]. Patient taken to the operating room, appropriate anesthetic  monitors were attached General endotracheal anesthesia induced with  the patient in supine position, Foley catheter was inserted. Tourniquet  applied high to the operative thigh. Lateral post and foot positioner  applied to the table, the lower extremity was then prepped and draped  in usual sterile fashion from the ankle to the  tourniquet. Time-out procedure was performed. The limb was wrapped with an Esmarch bandage and the tourniquet inflated to 365 mmHg. We began the operation by making the anterior midline incision starting at handbreadth above the patella going over the patella 1 cm medial to and  4 cm distal to the tibial tubercle. Small bleeders in the skin and the  subcutaneous tissue identified and cauterized. Transverse retinaculum was incised and reflected medially and a medial parapatellar arthrotomy was accomplished. the patella was everted and theprepatellar fat pad resected. The superficial medial collateral  ligament was then elevated from anterior to posterior along the proximal  flare of the tibia and anterior half of the menisci resected. The knee was hyperflexed exposing bone on bone arthritis. Peripheral and notch osteophytes as well as the cruciate ligaments were then resected. We continued to  work our way around posteriorly along the proximal tibia, and externally  rotated the tibia subluxing it out from underneath the femur. A McHale  retractor was placed through the notch and a lateral Hohmann retractor  placed, and we then drilled through the proximal tibia in line with the  axis of the tibia followed by an intramedullary guide rod and 2-degree  posterior slope cutting guide. The tibial cutting guide was pinned into place  allowing resection of 6 mm of bone medially and about 4 mm of bone  laterally because of her valgus deformity. Satisfied with the tibial resection, we then  entered the distal femur 2 mm anterior to the PCL origin with the  intramedullary guide rod and applied the distal femoral cutting guide  set at 7mm, with 5 degrees of valgus. This was pinned along the  epicondylar axis. At this point, the distal femoral cut was accomplished without difficulty. We then sized for a #2 femoral component and pinned the guide in 3 degrees of external rotation.The chamfer cutting guide was  pinned into place. The anterior, posterior, and chamfer cuts were accomplished without difficulty followed by  the Sigma RP box cutting guide and the box cut. We also removed posterior osteophytes from the posterior femoral condyles. At this  time, the knee was brought into full extension. We checked our  extension and flexion gaps and found them symmetric at 12.22mm.  The patella thickness measured at 22 mm. We set the cutting guide at 13 and removed the posterior 9.5-10 mm  of the patella sized for 32 button and drilled the lollipop. The knee  was then once again hyperflexed exposing the proximal tibia. We sized for a #2.5 tibial base plate, applied the smokestack and the conical reamer followed by the the Delta fin keel punch. We then hammered into place the Sigma RP trial femoral component, inserted a 12.5-mm trial bearing, trial patellar button, and took the knee through range of motion from 0-130 degrees. No thumb pressure was required for patellar  tracking. At this point, all trial components were removed, a double batch of DePuy HV cement  was mixed and applied to all bony metallic mating surfaces except for the posterior condyles of the femur itself. In order, we  hammered into place the tibial tray and removed excess cement, the femoral component and removed excess cement, a 12.5-mm Sigma RP bearing  was inserted, and the knee brought to full extension with compression.  The patellar button was clamped into place, and excess cement  removed. While the cement cured the wound was irrigated out with normal saline solution pulse lavage, and medium Hemovac drains were placed.. Ligament stability and patellar tracking were checked and found to be excellent. The tourniquet was then released and hemostasis was obtained with cautery. The parapatellar arthrotomy was closed with  #1 ethibond suture. The subcutaneous tissue with 0 and 2-0 undyed  Vicryl suture, and 4-0 Monocryl.. A dressing of Xeroform,   4 x 4, dressing sponges, Webril, and Ace wrap applied. Needle and sponge count were correct times 2.The patient awakened, extubated, and taken to recovery room without difficulty. Vascular status was normal, pulses 2+ and symmetric.   Dodie Parisi A 02/21/2014, 12:56 PM

## 2014-02-21 NOTE — Preoperative (Signed)
Beta Blockers   Reason not to administer Beta Blockers:Not Applicable 

## 2014-02-22 ENCOUNTER — Encounter (HOSPITAL_COMMUNITY): Payer: Self-pay | Admitting: Orthopedic Surgery

## 2014-02-22 LAB — CBC
HCT: 30.8 % — ABNORMAL LOW (ref 36.0–46.0)
Hemoglobin: 10.6 g/dL — ABNORMAL LOW (ref 12.0–15.0)
MCH: 30.9 pg (ref 26.0–34.0)
MCHC: 34.4 g/dL (ref 30.0–36.0)
MCV: 89.8 fL (ref 78.0–100.0)
Platelets: 346 10*3/uL (ref 150–400)
RBC: 3.43 MIL/uL — AB (ref 3.87–5.11)
RDW: 13.3 % (ref 11.5–15.5)
WBC: 17 10*3/uL — ABNORMAL HIGH (ref 4.0–10.5)

## 2014-02-22 LAB — BASIC METABOLIC PANEL
BUN: 32 mg/dL — ABNORMAL HIGH (ref 6–23)
CALCIUM: 9.3 mg/dL (ref 8.4–10.5)
CO2: 20 meq/L (ref 19–32)
CREATININE: 1.57 mg/dL — AB (ref 0.50–1.10)
Chloride: 98 mEq/L (ref 96–112)
GFR calc Af Amer: 38 mL/min — ABNORMAL LOW (ref >90)
GFR, EST NON AFRICAN AMERICAN: 33 mL/min — AB (ref >90)
GLUCOSE: 211 mg/dL — AB (ref 70–99)
Potassium: 4.2 mEq/L (ref 3.7–5.3)
SODIUM: 136 meq/L — AB (ref 137–147)

## 2014-02-22 LAB — GLUCOSE, CAPILLARY
Glucose-Capillary: 179 mg/dL — ABNORMAL HIGH (ref 70–99)
Glucose-Capillary: 186 mg/dL — ABNORMAL HIGH (ref 70–99)

## 2014-02-22 MED ORDER — PROMETHAZINE HCL 25 MG/ML IJ SOLN
12.5000 mg | Freq: Four times a day (QID) | INTRAMUSCULAR | Status: DC | PRN
  Filled 2014-02-22: qty 1

## 2014-02-22 MED ORDER — SODIUM CHLORIDE 0.9 % IV BOLUS (SEPSIS)
500.0000 mL | Freq: Once | INTRAVENOUS | Status: AC
  Administered 2014-02-22: 500 mL via INTRAVENOUS

## 2014-02-22 MED ORDER — ACETAMINOPHEN 10 MG/ML IV SOLN
1000.0000 mg | Freq: Four times a day (QID) | INTRAVENOUS | Status: DC
  Administered 2014-02-22: 1000 mg via INTRAVENOUS
  Filled 2014-02-22 (×4): qty 100

## 2014-02-22 MED ORDER — HYDROMORPHONE HCL 2 MG PO TABS: ORAL_TABLET | ORAL | Status: DC

## 2014-02-22 MED ORDER — DSS 100 MG PO CAPS
ORAL_CAPSULE | ORAL | Status: DC
Start: 2014-02-22 — End: 2018-08-27

## 2014-02-22 MED ORDER — ONDANSETRON HCL 4 MG PO TABS: ORAL_TABLET | ORAL | Status: DC

## 2014-02-22 MED ORDER — HYDROMORPHONE HCL 2 MG PO TABS: 2.0000 mg | ORAL_TABLET | ORAL | Status: DC | PRN

## 2014-02-22 MED ORDER — BISACODYL 5 MG PO TBEC: DELAYED_RELEASE_TABLET | ORAL | Status: DC

## 2014-02-22 MED ORDER — RIVAROXABAN 10 MG PO TABS: 10.0000 mg | ORAL_TABLET | Freq: Every day | ORAL | Status: DC

## 2014-02-22 NOTE — Care Management Note (Signed)
CARE MANAGEMENT NOTE 02/22/2014  Patient:  Tricia Harrison, Tricia Harrison   Account Number:  [id number]  Date Initiated:  02/22/2014  Documentation initiated by:  Ricki Miller  Subjective/Objective Assessment:   68 yr old female s/p left total knee arthroplasty.     Action/Plan:   Case manager spoke with patient concerning home health and DMe needs at discharge. Preoperatively setup with [facility], no changes. Pt has rolling walker and 3in1, CPM has been delivered.   Anticipated DC Date:  02/22/2014   Anticipated DC Plan:  [place]  CM consult      [facility] Choice  HOME HEALTH  DURABLE MEDICAL EQUIPMENT   Choice offered to / List presented to:  C-1 Patient   DME arranged  CPM      DME agency  [employer]     HH arranged  HH-2 PT      [place].   Status of service:  Completed, signed off Medicare Important Message given?   (If response is "NO", the following Medicare IM given date fields will be blank) Date Medicare IM given:    Discharge Disposition:  [place]

## 2014-02-22 NOTE — Progress Notes (Signed)
Physical Therapy Treatment Patient Details Name: Tricia Harrison MRN: [id number] DOB: 24-Feb-1946 Today's Date: 02/22/2014    History of Present Illness Patient is a 68 yo female s/p Lt TKA.    PT Comments    Pt has completed stair training and is safely demonstrating ability to navigate single curb similar to home environment with min guard. Pt will benefit from skilled PT in home setting in order to improve independence with functional mobility. All education reviewed and neither family nor pt have any questions at this time. Pt safe for discharge from functional mobility standpoint.   Follow Up Recommendations  Home health PT;Supervision/Assistance - 24 hour     Equipment Recommendations  Wheelchair (measurements PT);Wheelchair cushion (measurements PT) (Patient requesting w/c for use in house and for longer dist.)    Recommendations for Other Services       Precautions / Restrictions Precautions Precautions: Knee;Fall Required Braces or Orthoses: Knee Immobilizer - Left Knee Immobilizer - Left: On when out of bed or walking Restrictions Weight Bearing Restrictions: Yes LLE Weight Bearing: Weight bearing as tolerated    Mobility  Bed Mobility                  Transfers Overall transfer level: Needs assistance Equipment used: Rolling walker (2 wheeled) Transfers: Sit to/from Stand Sit to Stand: Min guard         General transfer comment: Min guard for safety, verbal cues for hand placement and to weight shift forward. Pt rocks in seat to perform Sit>stand. Good control with decent to sit.  Ambulation/Gait Ambulation/Gait assistance: Min guard Ambulation Distance (Feet): 35 Feet Assistive device: Rolling walker (2 wheeled) Gait Pattern/deviations: Step-to pattern;Decreased stance time - left;Antalgic Gait velocity: decreased   General Gait Details: Pt demonstrates good control of left knee with no instanes of buckling. No vomiting this afternoon and was  able to safely maneuver RW.   Stairs Stairs: Yes Stairs assistance: Min guard Stair Management: No rails;Step to pattern;Backwards;Forwards;With walker Number of Stairs: 1 General stair comments: Pt trained for ascending/descending one step similar to home enviornment with RW forwards and backwards with min guard and cues for technqiue/sequencing. Pt has a stool that she steps up in order to enter bed and this was practiced with a higher threshold. Pt demonstrates safe understanding.  Wheelchair Mobility    Modified Rankin (Stroke Patients Only)       Balance                                    Cognition Arousal/Alertness: Awake/alert Behavior During Therapy: WFL for tasks assessed/performed Overall Cognitive Status: Within Functional Limits for tasks assessed                      Exercises Total Joint Exercises Ankle Circles/Pumps: AROM;Both;10 reps;Seated Quad Sets: AROM;Left;10 reps;Supine Goniometric ROM: 11-93 degrees left knee flexion    General Comments        Pertinent Vitals/Pain 4/10 pain Pt repositioned in chair for comfort.    Home Living                      Prior Function            PT Goals (current goals can now be found in the care plan section) Acute Rehab PT Goals PT Goal Formulation: With patient/family Time For Goal Achievement: 02/28/14 Potential to Achieve Goals: Good  Progress towards PT goals: Progressing toward goals    Frequency  7X/week    PT Plan Current plan remains appropriate    End of Session Equipment Utilized During Treatment: Gait belt Activity Tolerance: Patient tolerated treatment well Patient left: with call bell/phone within reach;with family/visitor present;in chair     Time: 8412-8208 PT Time Calculation (min): 22 min  Charges:  $Gait Training: 8-22 mins                    G Codes:      [employer], [place]  Ellouise Newer 02/22/2014, 3:26 PM

## 2014-02-22 NOTE — Progress Notes (Signed)
Physical Therapy Treatment Patient Details Name: Tricia Harrison MRN: [id number] DOB: 25-Feb-1946 Today's Date: 02/22/2014    History of Present Illness Patient is a 68 yo female s/p Lt TKA.    PT Comments    Pt limited by nausea and vomiting this morning. She is ambulating generally well, showing good control of right knee with no instances of buckling. Pt was not able to complete stair training due to the nausea but is highly motivated and has strong family support; PT will attempt stair training early afternoon to ensure safety for D/c.   Follow Up Recommendations  Home health PT;Supervision/Assistance - 24 hour     Equipment Recommendations  Wheelchair (measurements PT);Wheelchair cushion (measurements PT) (Patient requesting w/c for use in house and for longer dist.)    Recommendations for Other Services       Precautions / Restrictions Precautions Precautions: Knee;Fall Required Braces or Orthoses: Knee Immobilizer - Left Knee Immobilizer - Left: On when out of bed or walking Restrictions Weight Bearing Restrictions: Yes LLE Weight Bearing: Weight bearing as tolerated    Mobility  Bed Mobility Overal bed mobility: Needs Assistance Bed Mobility: Sit to Supine       Sit to supine: Min assist   General bed mobility comments: Min assist for RLE into bed. Pt able to position herself without assist  Transfers Overall transfer level: Needs assistance Equipment used: Rolling walker (2 wheeled) Transfers: Sit to/from Stand Sit to Stand: Min guard         General transfer comment: Min guard for safety, verbal cues for hand placement and to weight shift forward. Pt rocks in seat to perform Sit>stand. Good control with decent to sit.  Ambulation/Gait Ambulation/Gait assistance: Min guard Ambulation Distance (Feet): 30 Feet (additional bout of 15 feet) Assistive device: Rolling walker (2 wheeled) Gait Pattern/deviations: Step-to pattern;Step-through  pattern;Decreased stance time - left Gait velocity: decreased   General Gait Details: Pt ambulates very slowly, but shows good stability of left knee in stance phase of gait. Pt complains of feeling weak and nauseas. Frequent rest breaks needed for pt due to nausea. Several bouts of vomiting, limited ability to practice stair training this morning.   Stairs            Wheelchair Mobility    Modified Rankin (Stroke Patients Only)       Balance                                    Cognition Arousal/Alertness: Awake/alert Behavior During Therapy: WFL for tasks assessed/performed Overall Cognitive Status: Within Functional Limits for tasks assessed                      Exercises Total Joint Exercises Ankle Circles/Pumps: AROM;Both;10 reps;Seated Quad Sets: AROM;Left;10 reps;Supine    General Comments General comments (skin integrity, edema, etc.): Pt with several bouts of vomiting      Pertinent Vitals/Pain 5/10 pain Nausea and vomiting Nurse notified and aware Pt repositioned in bed with head elevated for comfort    Home Living                      Prior Function            PT Goals (current goals can now be found in the care plan section) Acute Rehab PT Goals PT Goal Formulation: With patient/family Time For Goal Achievement: 02/28/14 Potential  to Achieve Goals: Good Progress towards PT goals: Progressing toward goals    Frequency  7X/week    PT Plan Current plan remains appropriate    End of Session Equipment Utilized During Treatment: Gait belt;Left knee immobilizer Activity Tolerance: Patient limited by pain;Other (comment) (nausea) Patient left: in bed;with call bell/phone within reach;with family/visitor present     Time: 5427-0623 PT Time Calculation (min): 40 min  Charges:  $Gait Training: 23-37 mins $Self Care/Home Management: 8-22                    G Codes:      Tricia Harrison,  [place]   Tricia Harrison 02/22/2014, 11:12 AM

## 2014-02-22 NOTE — Progress Notes (Signed)
Occupational Therapy Evaluation and Discharge Patient Details Name: Tricia Harrison MRN: [id number] DOB: November 27, 1945 Today's Date: 02/22/2014    History of Present Illness Patient is a 68 yo female s/p Lt TKA.   Clinical Impression   PTA pt lived with sister and was Independent with ADLs and mobility. Education and training completed regarding compensatory techniques for LB ADLs. Pt declined opportunity to practice tub transfer as she will sponge bathe until she is ready. Education and handout provided for sequencing of safe tub transfer with use of RW and BSC. Pt reports that her sister and son will be available 24/7 to assist with ADLs as needed and has no further OT concerns. Acute OT to sign off.     Follow Up Recommendations  No OT follow up;Supervision/Assistance - 24 hour    Equipment Recommendations  None recommended by OT       Precautions / Restrictions Precautions Precautions: Knee;Fall Precaution Comments: Reviewed precautions with pt including wear of KI when OOB Required Braces or Orthoses: Knee Immobilizer - Left Knee Immobilizer - Left: On when out of bed or walking Restrictions Weight Bearing Restrictions: Yes LLE Weight Bearing: Weight bearing as tolerated      Mobility                   Transfers Overall transfer level: Needs assistance Equipment used: Rolling walker (2 wheeled) Transfers: Sit to/from Stand Sit to Stand: Min guard         General transfer comment: Verbal cues for hand placement and min guard for safety. Pt rocks forward to power up to standing.          ADL Eating/Feeding: Independent;Sitting Grooming: Set up;Sitting   Upper Body Bathing: Setup; Sitting Upper Body Dressing : Set up;Sitting Lower Body Bathing: Minimal assistance;Sit to/from stand Lower Body Dressing: Moderate assistance;Sit to/from stand Toilet Transfer: Min guard;RW;BSC;Ambulation;Cueing for sequencing;Cueing for safety Toileting- Clothing Manipulation  and Hygiene: Minimal assistance;Sit to/from stand (assist needed for management of pants due to [facility])   Functional mobility during ADLs: Min guard;Cueing for safety;Cueing for sequencing;Rolling walker General ADL Comments: Pt reports that she will sponge bathe until she can manage shower transfer. Education and handout provided for sequencing tub transfer with safety and use of BSC in shower.                Pertinent Vitals/Pain No c/o pain        Extremity/Trunk Assessment Upper Extremity Assessment Upper Extremity Assessment: Overall WFL for tasks assessed   Lower Extremity Assessment Lower Extremity Assessment: Defer to PT evaluation       Communication Communication Communication: No difficulties   Cognition Arousal/Alertness: Awake/alert Behavior During Therapy: WFL for tasks assessed/performed Overall Cognitive Status: Within Functional Limits for tasks assessed                             Home Living Family/patient expects to be discharged to:: Private residence Living Arrangements: Other relatives (Sister) Available Help at Discharge: Family;Available 24 hours/day Type of Home: Apartment (first floor apartment) Home Access: Stairs to enter Entrance Stairs-Number of Steps: 1 Entrance Stairs-Rails: None Home Layout: One level     Bathroom Shower/Tub: Tub/shower unit Shower/tub characteristics: Curtain Biochemist, clinical: Standard     Home Equipment: Environmental consultant - 2 wheels;Grab bars - tub/shower;Bedside commode          Prior Functioning/Environment Level of Independence: Independent  End of Session: Equipment Utilized During Treatment: Gait belt;Rolling walker;Left knee immobilizer CPM Left Knee CPM Left Knee: Off  Activity Tolerance: Patient tolerated treatment well Patient left: in chair;with call bell/phone within reach;Other (comment) (awaiting PT to work on steps)   Time: 1410-1451 OT Time  Calculation (min): 41 min Charges:  OT General Charges $OT Visit: 1 Procedure OT Evaluation $Initial OT Evaluation Tier I: 1 Procedure OT Treatments $Self Care/Home Management : 23-37 mins  Juluis Rainier [phone] 02/22/2014, 4:10 PM

## 2014-02-24 DIAGNOSIS — R269 Unspecified abnormalities of gait and mobility: Secondary | ICD-10-CM | POA: Diagnosis not present

## 2014-02-24 DIAGNOSIS — M48061 Spinal stenosis, lumbar region without neurogenic claudication: Secondary | ICD-10-CM | POA: Diagnosis not present

## 2014-02-24 DIAGNOSIS — IMO0001 Reserved for inherently not codable concepts without codable children: Secondary | ICD-10-CM | POA: Diagnosis not present

## 2014-02-24 DIAGNOSIS — Z471 Aftercare following joint replacement surgery: Secondary | ICD-10-CM | POA: Diagnosis not present

## 2014-02-24 DIAGNOSIS — E119 Type 2 diabetes mellitus without complications: Secondary | ICD-10-CM | POA: Diagnosis not present

## 2014-02-24 DIAGNOSIS — Z96659 Presence of unspecified artificial knee joint: Secondary | ICD-10-CM | POA: Diagnosis not present

## 2014-02-24 DIAGNOSIS — I10 Essential (primary) hypertension: Secondary | ICD-10-CM | POA: Diagnosis not present

## 2014-02-25 DIAGNOSIS — R269 Unspecified abnormalities of gait and mobility: Secondary | ICD-10-CM | POA: Diagnosis not present

## 2014-02-25 DIAGNOSIS — Z471 Aftercare following joint replacement surgery: Secondary | ICD-10-CM | POA: Diagnosis not present

## 2014-02-25 DIAGNOSIS — IMO0001 Reserved for inherently not codable concepts without codable children: Secondary | ICD-10-CM | POA: Diagnosis not present

## 2014-02-25 DIAGNOSIS — E119 Type 2 diabetes mellitus without complications: Secondary | ICD-10-CM | POA: Diagnosis not present

## 2014-02-25 DIAGNOSIS — M48061 Spinal stenosis, lumbar region without neurogenic claudication: Secondary | ICD-10-CM | POA: Diagnosis not present

## 2014-02-26 DIAGNOSIS — IMO0001 Reserved for inherently not codable concepts without codable children: Secondary | ICD-10-CM | POA: Diagnosis not present

## 2014-02-26 DIAGNOSIS — E119 Type 2 diabetes mellitus without complications: Secondary | ICD-10-CM | POA: Diagnosis not present

## 2014-02-26 DIAGNOSIS — M48061 Spinal stenosis, lumbar region without neurogenic claudication: Secondary | ICD-10-CM | POA: Diagnosis not present

## 2014-02-26 DIAGNOSIS — Z471 Aftercare following joint replacement surgery: Secondary | ICD-10-CM | POA: Diagnosis not present

## 2014-02-26 DIAGNOSIS — R269 Unspecified abnormalities of gait and mobility: Secondary | ICD-10-CM | POA: Diagnosis not present

## 2014-02-28 DIAGNOSIS — M48061 Spinal stenosis, lumbar region without neurogenic claudication: Secondary | ICD-10-CM | POA: Diagnosis not present

## 2014-02-28 DIAGNOSIS — IMO0001 Reserved for inherently not codable concepts without codable children: Secondary | ICD-10-CM | POA: Diagnosis not present

## 2014-02-28 DIAGNOSIS — E119 Type 2 diabetes mellitus without complications: Secondary | ICD-10-CM | POA: Diagnosis not present

## 2014-02-28 DIAGNOSIS — Z471 Aftercare following joint replacement surgery: Secondary | ICD-10-CM | POA: Diagnosis not present

## 2014-02-28 DIAGNOSIS — R269 Unspecified abnormalities of gait and mobility: Secondary | ICD-10-CM | POA: Diagnosis not present

## 2014-03-01 DIAGNOSIS — R269 Unspecified abnormalities of gait and mobility: Secondary | ICD-10-CM | POA: Diagnosis not present

## 2014-03-01 DIAGNOSIS — M48061 Spinal stenosis, lumbar region without neurogenic claudication: Secondary | ICD-10-CM | POA: Diagnosis not present

## 2014-03-01 DIAGNOSIS — E119 Type 2 diabetes mellitus without complications: Secondary | ICD-10-CM | POA: Diagnosis not present

## 2014-03-01 DIAGNOSIS — IMO0001 Reserved for inherently not codable concepts without codable children: Secondary | ICD-10-CM | POA: Diagnosis not present

## 2014-03-01 DIAGNOSIS — Z471 Aftercare following joint replacement surgery: Secondary | ICD-10-CM | POA: Diagnosis not present

## 2014-03-02 DIAGNOSIS — E119 Type 2 diabetes mellitus without complications: Secondary | ICD-10-CM | POA: Diagnosis not present

## 2014-03-02 DIAGNOSIS — M48061 Spinal stenosis, lumbar region without neurogenic claudication: Secondary | ICD-10-CM | POA: Diagnosis not present

## 2014-03-02 DIAGNOSIS — Z471 Aftercare following joint replacement surgery: Secondary | ICD-10-CM | POA: Diagnosis not present

## 2014-03-02 DIAGNOSIS — R269 Unspecified abnormalities of gait and mobility: Secondary | ICD-10-CM | POA: Diagnosis not present

## 2014-03-02 DIAGNOSIS — IMO0001 Reserved for inherently not codable concepts without codable children: Secondary | ICD-10-CM | POA: Diagnosis not present

## 2014-03-03 DIAGNOSIS — Z471 Aftercare following joint replacement surgery: Secondary | ICD-10-CM | POA: Diagnosis not present

## 2014-03-03 DIAGNOSIS — M48061 Spinal stenosis, lumbar region without neurogenic claudication: Secondary | ICD-10-CM | POA: Diagnosis not present

## 2014-03-03 DIAGNOSIS — IMO0001 Reserved for inherently not codable concepts without codable children: Secondary | ICD-10-CM | POA: Diagnosis not present

## 2014-03-03 DIAGNOSIS — R269 Unspecified abnormalities of gait and mobility: Secondary | ICD-10-CM | POA: Diagnosis not present

## 2014-03-03 DIAGNOSIS — E119 Type 2 diabetes mellitus without complications: Secondary | ICD-10-CM | POA: Diagnosis not present

## 2014-03-03 NOTE — Discharge Summary (Signed)
Patient ID: Tricia Harrison MRN: [id number] DOB/AGE: July 11, 1946 68 y.o.  Admit date: 02/21/2014 Discharge date: 03/03/2014  Admission Diagnoses:  Principal Problem:   Left knee DJD Active Problems:   HTN (hypertension)   Hypothyroidism   HLD (hyperlipidemia)   Palpitations   Morbid obesity   PVC (premature ventricular contraction)   GERD (gastroesophageal reflux disease)   Lumbar spinal stenosis   Diabetes mellitus without complication   DJD (degenerative joint disease) of knee   Discharge Diagnoses:  Same  Past Medical History  Diagnosis Date  . Hx of gallstones   . HLD (hyperlipidemia)     takes Fenofibrate daily  . Diverticulosis   . Left knee DJD   . Hypothyroidism     takes Synthroid daily  . GERD (gastroesophageal reflux disease)     takes Omeprazole daily  . PONV (postoperative nausea and vomiting)   . Dysphagia   . HTN (hypertension)     takes Diltiazem and Ramipril daily  . Palpitations     started in 2013 and only occasionally;last time noticed about 2-3wks ago and after drinking caffeine  . Asthma     as a child  . History of bronchitis     states its been a long time ago  . Joint pain   . Joint swelling   . Chronic back pain     spinal stenosis and buldging disc;scoliosis  . History of gout   . H/O hiatal hernia   . Gallstones     has known about this for 3-9yrs  . Chronic constipation     occasionally takes something OTC  . Hemorrhoids   . Diverticulosis   . Nocturia   . Anemia     as a child  . History of blood transfusion     no abnormal reaction noted  . Diabetes mellitus without complication     per pt "Pre" for 20+yrs    Surgeries: Procedure(s): TOTAL KNEE ARTHROPLASTY on 02/21/2014   Consultants:    Discharged Condition: Improved  Hospital Course: Tricia Harrison is an 68 y.o. female who was admitted 02/21/2014 for operative treatment ofLeft knee DJD. Patient has severe unremitting pain that affects sleep, daily activities,  and work/hobbies. After pre-op clearance the patient was taken to the operating room on 02/21/2014 and underwent  Procedure(s): TOTAL KNEE ARTHROPLASTY.    Patient was given perioperative antibiotics:  Anti-infectives   Start     Dose/Rate Route Frequency Ordered Stop   02/21/14 1800  ceFAZolin (ANCEF) IVPB 2 g/50 mL premix     2 g 100 mL/hr over 30 Minutes Intravenous Every 6 hours 02/21/14 1553 02/22/14 0029   02/21/14 1002  ceFAZolin (ANCEF) 2-3 GM-% IVPB SOLR    Comments:  Rushie Goltz   : cabinet override      02/21/14 1002 02/21/14 1115   02/20/14 1148  ceFAZolin (ANCEF) IVPB 2 g/50 mL premix  Status:  Discontinued     2 g 100 mL/hr over 30 Minutes Intravenous On call to O.R. 02/20/14 1148 02/21/14 1553       Patient was given sequential compression devices, early ambulation, and chemoprophylaxis to prevent DVT.  Patient benefited maximally from hospital stay and there were no complications.    Recent vital signs: No data found.    Recent laboratory studies: No results found for this basename: WBC, HGB, HCT, PLT, NA, K, CL, CO2, BUN, CREATININE, GLUCOSE, PT, INR, CALCIUM, 2,  in the last 72 hours   Discharge Medications:  Medication List         acetaminophen 500 MG tablet  Commonly known as:  TYLENOL  Take 1,000 mg by mouth every 6 (six) hours as needed for mild pain.     bisacodyl 5 MG EC tablet  Commonly known as:  DULCOLAX  Take 2 tablets every night with dinner until bowel movement.  LAXITIVE.  Restart if two days since last bowel movement     diltiazem 360 MG 24 hr tablet  Commonly known as:  CARDIZEM LA  Take 360 mg by mouth daily.     DSS 100 MG Caps  1 tab 2 times a day while on narcotics.  STOOL SOFTENER     fenofibrate 145 MG tablet  Commonly known as:  TRICOR  Take 145 mg by mouth daily.     HYDROmorphone 2 MG tablet  Commonly known as:  DILAUDID  1-2 tablets every 4-6 hrs as needed for pain     levothyroxine 50 MCG tablet  Commonly known  as:  SYNTHROID, LEVOTHROID  Take 50 mcg by mouth daily before breakfast.     omeprazole 20 MG capsule  Commonly known as:  PRILOSEC  Take 20 mg by mouth daily.     ondansetron 4 MG tablet  Commonly known as:  ZOFRAN  1-2 tablets every 6-8 hrs prn nausea     ramipril 10 MG capsule  Commonly known as:  ALTACE  Take 10 mg by mouth daily.     rivaroxaban 10 MG Tabs tablet  Commonly known as:  XARELTO  Take 1 tablet (10 mg total) by mouth daily with breakfast.     Vitamin D3 2000 UNITS Tabs  Take 2,000 mg by mouth daily.        Diagnostic Studies: Dg Chest 2 View  02/07/2014   CLINICAL DATA:  Preop left knee replacement.  EXAM: CHEST  2 VIEW  COMPARISON:  None.  FINDINGS: Trachea is midline. Heart size normal. The lungs are clear. No pleural fluid. S-shaped scoliosis in the thoracolumbar spine.  IMPRESSION: No acute findings.   Electronically Signed   By: Lorin Picket M.D.   On: 02/07/2014 14:18    Disposition: 01-Home or Self Care      Discharge Orders   Future Appointments Provider Department Dept Phone   04/25/2014 9:00 AM [facility] Dm Core Class 4 [facility] [phone]   Future Orders Complete By Expires   Call MD / Call 911  As directed    Change dressing  As directed    Constipation Prevention  As directed    CPM  As directed    Diet - low sodium heart healthy  As directed    Discharge instructions  As directed    Do not put a pillow under the knee. Place it under the heel.  As directed    Increase activity slowly as tolerated  As directed    TED hose  As directed       Follow-up Information   Follow up with Lorn Junes, MD On 03/07/2014. (appt time 2 pm)    Specialty:  Orthopedic Surgery   Contact information:   [place] [id number] [phone]       Follow up with [facility]. (Someone from [facility] will contact you concerning physical therapy start  date and time.)    Contact information:   [address] [place] [address] [phone]  Signed: Linda Hedges 03/03/2014, 1:42 PM

## 2014-03-04 DIAGNOSIS — Z471 Aftercare following joint replacement surgery: Secondary | ICD-10-CM | POA: Diagnosis not present

## 2014-03-04 DIAGNOSIS — M48061 Spinal stenosis, lumbar region without neurogenic claudication: Secondary | ICD-10-CM | POA: Diagnosis not present

## 2014-03-04 DIAGNOSIS — R269 Unspecified abnormalities of gait and mobility: Secondary | ICD-10-CM | POA: Diagnosis not present

## 2014-03-04 DIAGNOSIS — IMO0001 Reserved for inherently not codable concepts without codable children: Secondary | ICD-10-CM | POA: Diagnosis not present

## 2014-03-04 DIAGNOSIS — E119 Type 2 diabetes mellitus without complications: Secondary | ICD-10-CM | POA: Diagnosis not present

## 2014-03-07 DIAGNOSIS — IMO0001 Reserved for inherently not codable concepts without codable children: Secondary | ICD-10-CM | POA: Diagnosis not present

## 2014-03-07 DIAGNOSIS — M48061 Spinal stenosis, lumbar region without neurogenic claudication: Secondary | ICD-10-CM | POA: Diagnosis not present

## 2014-03-07 DIAGNOSIS — E119 Type 2 diabetes mellitus without complications: Secondary | ICD-10-CM | POA: Diagnosis not present

## 2014-03-07 DIAGNOSIS — Z471 Aftercare following joint replacement surgery: Secondary | ICD-10-CM | POA: Diagnosis not present

## 2014-03-07 DIAGNOSIS — R269 Unspecified abnormalities of gait and mobility: Secondary | ICD-10-CM | POA: Diagnosis not present

## 2014-03-07 DIAGNOSIS — Z96659 Presence of unspecified artificial knee joint: Secondary | ICD-10-CM | POA: Diagnosis not present

## 2014-03-08 DIAGNOSIS — E119 Type 2 diabetes mellitus without complications: Secondary | ICD-10-CM | POA: Diagnosis not present

## 2014-03-08 DIAGNOSIS — IMO0001 Reserved for inherently not codable concepts without codable children: Secondary | ICD-10-CM | POA: Diagnosis not present

## 2014-03-08 DIAGNOSIS — M48061 Spinal stenosis, lumbar region without neurogenic claudication: Secondary | ICD-10-CM | POA: Diagnosis not present

## 2014-03-08 DIAGNOSIS — Z471 Aftercare following joint replacement surgery: Secondary | ICD-10-CM | POA: Diagnosis not present

## 2014-03-08 DIAGNOSIS — R269 Unspecified abnormalities of gait and mobility: Secondary | ICD-10-CM | POA: Diagnosis not present

## 2014-03-14 DIAGNOSIS — M6281 Muscle weakness (generalized): Secondary | ICD-10-CM | POA: Diagnosis not present

## 2014-03-14 DIAGNOSIS — M25569 Pain in unspecified knee: Secondary | ICD-10-CM | POA: Diagnosis not present

## 2014-03-14 DIAGNOSIS — R262 Difficulty in walking, not elsewhere classified: Secondary | ICD-10-CM | POA: Diagnosis not present

## 2014-03-14 DIAGNOSIS — M25669 Stiffness of unspecified knee, not elsewhere classified: Secondary | ICD-10-CM | POA: Diagnosis not present

## 2014-03-16 DIAGNOSIS — Z471 Aftercare following joint replacement surgery: Secondary | ICD-10-CM | POA: Diagnosis not present

## 2014-03-16 DIAGNOSIS — M25669 Stiffness of unspecified knee, not elsewhere classified: Secondary | ICD-10-CM | POA: Diagnosis not present

## 2014-03-16 DIAGNOSIS — R262 Difficulty in walking, not elsewhere classified: Secondary | ICD-10-CM | POA: Diagnosis not present

## 2014-03-16 DIAGNOSIS — M1710 Unilateral primary osteoarthritis, unspecified knee: Secondary | ICD-10-CM | POA: Diagnosis not present

## 2014-03-16 DIAGNOSIS — M6281 Muscle weakness (generalized): Secondary | ICD-10-CM | POA: Diagnosis not present

## 2014-03-16 DIAGNOSIS — Z96659 Presence of unspecified artificial knee joint: Secondary | ICD-10-CM | POA: Diagnosis not present

## 2014-03-18 DIAGNOSIS — R262 Difficulty in walking, not elsewhere classified: Secondary | ICD-10-CM | POA: Diagnosis not present

## 2014-03-18 DIAGNOSIS — Z96659 Presence of unspecified artificial knee joint: Secondary | ICD-10-CM | POA: Diagnosis not present

## 2014-03-18 DIAGNOSIS — Z471 Aftercare following joint replacement surgery: Secondary | ICD-10-CM | POA: Diagnosis not present

## 2014-03-18 DIAGNOSIS — M1710 Unilateral primary osteoarthritis, unspecified knee: Secondary | ICD-10-CM | POA: Diagnosis not present

## 2014-03-18 DIAGNOSIS — M6281 Muscle weakness (generalized): Secondary | ICD-10-CM | POA: Diagnosis not present

## 2014-03-18 DIAGNOSIS — M25669 Stiffness of unspecified knee, not elsewhere classified: Secondary | ICD-10-CM | POA: Diagnosis not present

## 2014-03-21 DIAGNOSIS — R262 Difficulty in walking, not elsewhere classified: Secondary | ICD-10-CM | POA: Diagnosis not present

## 2014-03-21 DIAGNOSIS — M6281 Muscle weakness (generalized): Secondary | ICD-10-CM | POA: Diagnosis not present

## 2014-03-21 DIAGNOSIS — M25669 Stiffness of unspecified knee, not elsewhere classified: Secondary | ICD-10-CM | POA: Diagnosis not present

## 2014-03-23 DIAGNOSIS — M25669 Stiffness of unspecified knee, not elsewhere classified: Secondary | ICD-10-CM | POA: Diagnosis not present

## 2014-03-23 DIAGNOSIS — R262 Difficulty in walking, not elsewhere classified: Secondary | ICD-10-CM | POA: Diagnosis not present

## 2014-03-23 DIAGNOSIS — M6281 Muscle weakness (generalized): Secondary | ICD-10-CM | POA: Diagnosis not present

## 2014-03-24 DIAGNOSIS — Z963 Presence of artificial larynx: Secondary | ICD-10-CM | POA: Diagnosis not present

## 2014-03-24 DIAGNOSIS — M1710 Unilateral primary osteoarthritis, unspecified knee: Secondary | ICD-10-CM | POA: Diagnosis not present

## 2014-03-24 DIAGNOSIS — Z96659 Presence of unspecified artificial knee joint: Secondary | ICD-10-CM | POA: Diagnosis not present

## 2014-03-28 DIAGNOSIS — M6281 Muscle weakness (generalized): Secondary | ICD-10-CM | POA: Diagnosis not present

## 2014-03-28 DIAGNOSIS — M25669 Stiffness of unspecified knee, not elsewhere classified: Secondary | ICD-10-CM | POA: Diagnosis not present

## 2014-03-28 DIAGNOSIS — R262 Difficulty in walking, not elsewhere classified: Secondary | ICD-10-CM | POA: Diagnosis not present

## 2014-03-30 DIAGNOSIS — R262 Difficulty in walking, not elsewhere classified: Secondary | ICD-10-CM | POA: Diagnosis not present

## 2014-03-30 DIAGNOSIS — M6281 Muscle weakness (generalized): Secondary | ICD-10-CM | POA: Diagnosis not present

## 2014-03-30 DIAGNOSIS — M25669 Stiffness of unspecified knee, not elsewhere classified: Secondary | ICD-10-CM | POA: Diagnosis not present

## 2014-04-01 DIAGNOSIS — M25669 Stiffness of unspecified knee, not elsewhere classified: Secondary | ICD-10-CM | POA: Diagnosis not present

## 2014-04-01 DIAGNOSIS — M6281 Muscle weakness (generalized): Secondary | ICD-10-CM | POA: Diagnosis not present

## 2014-04-01 DIAGNOSIS — R262 Difficulty in walking, not elsewhere classified: Secondary | ICD-10-CM | POA: Diagnosis not present

## 2014-04-04 DIAGNOSIS — M6281 Muscle weakness (generalized): Secondary | ICD-10-CM | POA: Diagnosis not present

## 2014-04-04 DIAGNOSIS — R262 Difficulty in walking, not elsewhere classified: Secondary | ICD-10-CM | POA: Diagnosis not present

## 2014-04-04 DIAGNOSIS — M25669 Stiffness of unspecified knee, not elsewhere classified: Secondary | ICD-10-CM | POA: Diagnosis not present

## 2014-04-06 DIAGNOSIS — R262 Difficulty in walking, not elsewhere classified: Secondary | ICD-10-CM | POA: Diagnosis not present

## 2014-04-06 DIAGNOSIS — M6281 Muscle weakness (generalized): Secondary | ICD-10-CM | POA: Diagnosis not present

## 2014-04-06 DIAGNOSIS — M25669 Stiffness of unspecified knee, not elsewhere classified: Secondary | ICD-10-CM | POA: Diagnosis not present

## 2014-04-07 DIAGNOSIS — R262 Difficulty in walking, not elsewhere classified: Secondary | ICD-10-CM | POA: Diagnosis not present

## 2014-04-07 DIAGNOSIS — M25669 Stiffness of unspecified knee, not elsewhere classified: Secondary | ICD-10-CM | POA: Diagnosis not present

## 2014-04-07 DIAGNOSIS — M6281 Muscle weakness (generalized): Secondary | ICD-10-CM | POA: Diagnosis not present

## 2014-04-11 DIAGNOSIS — M6281 Muscle weakness (generalized): Secondary | ICD-10-CM | POA: Diagnosis not present

## 2014-04-11 DIAGNOSIS — M25669 Stiffness of unspecified knee, not elsewhere classified: Secondary | ICD-10-CM | POA: Diagnosis not present

## 2014-04-11 DIAGNOSIS — R262 Difficulty in walking, not elsewhere classified: Secondary | ICD-10-CM | POA: Diagnosis not present

## 2014-04-13 DIAGNOSIS — M6281 Muscle weakness (generalized): Secondary | ICD-10-CM | POA: Diagnosis not present

## 2014-04-13 DIAGNOSIS — M25669 Stiffness of unspecified knee, not elsewhere classified: Secondary | ICD-10-CM | POA: Diagnosis not present

## 2014-04-13 DIAGNOSIS — R262 Difficulty in walking, not elsewhere classified: Secondary | ICD-10-CM | POA: Diagnosis not present

## 2014-04-14 DIAGNOSIS — R262 Difficulty in walking, not elsewhere classified: Secondary | ICD-10-CM | POA: Diagnosis not present

## 2014-04-14 DIAGNOSIS — M25669 Stiffness of unspecified knee, not elsewhere classified: Secondary | ICD-10-CM | POA: Diagnosis not present

## 2014-04-14 DIAGNOSIS — M6281 Muscle weakness (generalized): Secondary | ICD-10-CM | POA: Diagnosis not present

## 2014-04-19 DIAGNOSIS — M25669 Stiffness of unspecified knee, not elsewhere classified: Secondary | ICD-10-CM | POA: Diagnosis not present

## 2014-04-19 DIAGNOSIS — M6281 Muscle weakness (generalized): Secondary | ICD-10-CM | POA: Diagnosis not present

## 2014-04-19 DIAGNOSIS — R262 Difficulty in walking, not elsewhere classified: Secondary | ICD-10-CM | POA: Diagnosis not present

## 2014-04-19 DIAGNOSIS — M25569 Pain in unspecified knee: Secondary | ICD-10-CM | POA: Diagnosis not present

## 2014-04-21 DIAGNOSIS — M25669 Stiffness of unspecified knee, not elsewhere classified: Secondary | ICD-10-CM | POA: Diagnosis not present

## 2014-04-21 DIAGNOSIS — M6281 Muscle weakness (generalized): Secondary | ICD-10-CM | POA: Diagnosis not present

## 2014-04-21 DIAGNOSIS — R262 Difficulty in walking, not elsewhere classified: Secondary | ICD-10-CM | POA: Diagnosis not present

## 2014-04-21 DIAGNOSIS — M25569 Pain in unspecified knee: Secondary | ICD-10-CM | POA: Diagnosis not present

## 2014-04-25 ENCOUNTER — Ambulatory Visit: Payer: Medicare Other

## 2014-04-25 DIAGNOSIS — M6281 Muscle weakness (generalized): Secondary | ICD-10-CM | POA: Diagnosis not present

## 2014-04-25 DIAGNOSIS — M25669 Stiffness of unspecified knee, not elsewhere classified: Secondary | ICD-10-CM | POA: Diagnosis not present

## 2014-04-25 DIAGNOSIS — R262 Difficulty in walking, not elsewhere classified: Secondary | ICD-10-CM | POA: Diagnosis not present

## 2014-04-27 DIAGNOSIS — R262 Difficulty in walking, not elsewhere classified: Secondary | ICD-10-CM | POA: Diagnosis not present

## 2014-04-27 DIAGNOSIS — M25669 Stiffness of unspecified knee, not elsewhere classified: Secondary | ICD-10-CM | POA: Diagnosis not present

## 2014-04-27 DIAGNOSIS — M6281 Muscle weakness (generalized): Secondary | ICD-10-CM | POA: Diagnosis not present

## 2014-04-28 DIAGNOSIS — R262 Difficulty in walking, not elsewhere classified: Secondary | ICD-10-CM | POA: Diagnosis not present

## 2014-04-28 DIAGNOSIS — M25669 Stiffness of unspecified knee, not elsewhere classified: Secondary | ICD-10-CM | POA: Diagnosis not present

## 2014-04-28 DIAGNOSIS — M6281 Muscle weakness (generalized): Secondary | ICD-10-CM | POA: Diagnosis not present

## 2014-05-02 DIAGNOSIS — R262 Difficulty in walking, not elsewhere classified: Secondary | ICD-10-CM | POA: Diagnosis not present

## 2014-05-02 DIAGNOSIS — M6281 Muscle weakness (generalized): Secondary | ICD-10-CM | POA: Diagnosis not present

## 2014-05-02 DIAGNOSIS — M25669 Stiffness of unspecified knee, not elsewhere classified: Secondary | ICD-10-CM | POA: Diagnosis not present

## 2014-05-04 DIAGNOSIS — M25669 Stiffness of unspecified knee, not elsewhere classified: Secondary | ICD-10-CM | POA: Diagnosis not present

## 2014-05-04 DIAGNOSIS — M6281 Muscle weakness (generalized): Secondary | ICD-10-CM | POA: Diagnosis not present

## 2014-05-04 DIAGNOSIS — R262 Difficulty in walking, not elsewhere classified: Secondary | ICD-10-CM | POA: Diagnosis not present

## 2014-05-06 DIAGNOSIS — M6281 Muscle weakness (generalized): Secondary | ICD-10-CM | POA: Diagnosis not present

## 2014-05-06 DIAGNOSIS — M25669 Stiffness of unspecified knee, not elsewhere classified: Secondary | ICD-10-CM | POA: Diagnosis not present

## 2014-05-06 DIAGNOSIS — R262 Difficulty in walking, not elsewhere classified: Secondary | ICD-10-CM | POA: Diagnosis not present

## 2014-05-09 DIAGNOSIS — M6281 Muscle weakness (generalized): Secondary | ICD-10-CM | POA: Diagnosis not present

## 2014-05-09 DIAGNOSIS — R262 Difficulty in walking, not elsewhere classified: Secondary | ICD-10-CM | POA: Diagnosis not present

## 2014-05-09 DIAGNOSIS — E039 Hypothyroidism, unspecified: Secondary | ICD-10-CM | POA: Diagnosis not present

## 2014-05-09 DIAGNOSIS — M25669 Stiffness of unspecified knee, not elsewhere classified: Secondary | ICD-10-CM | POA: Diagnosis not present

## 2014-05-10 DIAGNOSIS — R7309 Other abnormal glucose: Secondary | ICD-10-CM | POA: Diagnosis not present

## 2014-05-16 DIAGNOSIS — R262 Difficulty in walking, not elsewhere classified: Secondary | ICD-10-CM | POA: Diagnosis not present

## 2014-05-16 DIAGNOSIS — M545 Low back pain, unspecified: Secondary | ICD-10-CM | POA: Diagnosis not present

## 2014-05-16 DIAGNOSIS — M6281 Muscle weakness (generalized): Secondary | ICD-10-CM | POA: Diagnosis not present

## 2014-05-16 DIAGNOSIS — M47817 Spondylosis without myelopathy or radiculopathy, lumbosacral region: Secondary | ICD-10-CM | POA: Diagnosis not present

## 2014-05-16 DIAGNOSIS — M25669 Stiffness of unspecified knee, not elsewhere classified: Secondary | ICD-10-CM | POA: Diagnosis not present

## 2014-05-18 DIAGNOSIS — M25669 Stiffness of unspecified knee, not elsewhere classified: Secondary | ICD-10-CM | POA: Diagnosis not present

## 2014-05-18 DIAGNOSIS — M6281 Muscle weakness (generalized): Secondary | ICD-10-CM | POA: Diagnosis not present

## 2014-05-18 DIAGNOSIS — M47817 Spondylosis without myelopathy or radiculopathy, lumbosacral region: Secondary | ICD-10-CM | POA: Diagnosis not present

## 2014-05-18 DIAGNOSIS — M545 Low back pain, unspecified: Secondary | ICD-10-CM | POA: Diagnosis not present

## 2014-05-18 DIAGNOSIS — R262 Difficulty in walking, not elsewhere classified: Secondary | ICD-10-CM | POA: Diagnosis not present

## 2014-05-20 DIAGNOSIS — M25669 Stiffness of unspecified knee, not elsewhere classified: Secondary | ICD-10-CM | POA: Diagnosis not present

## 2014-05-20 DIAGNOSIS — M545 Low back pain, unspecified: Secondary | ICD-10-CM | POA: Diagnosis not present

## 2014-05-20 DIAGNOSIS — R262 Difficulty in walking, not elsewhere classified: Secondary | ICD-10-CM | POA: Diagnosis not present

## 2014-05-20 DIAGNOSIS — M47817 Spondylosis without myelopathy or radiculopathy, lumbosacral region: Secondary | ICD-10-CM | POA: Diagnosis not present

## 2014-05-20 DIAGNOSIS — M6281 Muscle weakness (generalized): Secondary | ICD-10-CM | POA: Diagnosis not present

## 2014-05-23 DIAGNOSIS — M6281 Muscle weakness (generalized): Secondary | ICD-10-CM | POA: Diagnosis not present

## 2014-05-23 DIAGNOSIS — M47817 Spondylosis without myelopathy or radiculopathy, lumbosacral region: Secondary | ICD-10-CM | POA: Diagnosis not present

## 2014-05-23 DIAGNOSIS — M545 Low back pain, unspecified: Secondary | ICD-10-CM | POA: Diagnosis not present

## 2014-05-23 DIAGNOSIS — R262 Difficulty in walking, not elsewhere classified: Secondary | ICD-10-CM | POA: Diagnosis not present

## 2014-05-25 DIAGNOSIS — M545 Low back pain, unspecified: Secondary | ICD-10-CM | POA: Diagnosis not present

## 2014-05-25 DIAGNOSIS — M47817 Spondylosis without myelopathy or radiculopathy, lumbosacral region: Secondary | ICD-10-CM | POA: Diagnosis not present

## 2014-05-25 DIAGNOSIS — M6281 Muscle weakness (generalized): Secondary | ICD-10-CM | POA: Diagnosis not present

## 2014-05-25 DIAGNOSIS — R262 Difficulty in walking, not elsewhere classified: Secondary | ICD-10-CM | POA: Diagnosis not present

## 2014-06-02 DIAGNOSIS — M6281 Muscle weakness (generalized): Secondary | ICD-10-CM | POA: Diagnosis not present

## 2014-06-02 DIAGNOSIS — R262 Difficulty in walking, not elsewhere classified: Secondary | ICD-10-CM | POA: Diagnosis not present

## 2014-06-02 DIAGNOSIS — M545 Low back pain, unspecified: Secondary | ICD-10-CM | POA: Diagnosis not present

## 2014-06-02 DIAGNOSIS — M47817 Spondylosis without myelopathy or radiculopathy, lumbosacral region: Secondary | ICD-10-CM | POA: Diagnosis not present

## 2014-06-08 DIAGNOSIS — M545 Low back pain, unspecified: Secondary | ICD-10-CM | POA: Diagnosis not present

## 2014-06-08 DIAGNOSIS — M47817 Spondylosis without myelopathy or radiculopathy, lumbosacral region: Secondary | ICD-10-CM | POA: Diagnosis not present

## 2014-06-08 DIAGNOSIS — M6281 Muscle weakness (generalized): Secondary | ICD-10-CM | POA: Diagnosis not present

## 2014-06-08 DIAGNOSIS — R262 Difficulty in walking, not elsewhere classified: Secondary | ICD-10-CM | POA: Diagnosis not present

## 2014-06-10 DIAGNOSIS — M545 Low back pain, unspecified: Secondary | ICD-10-CM | POA: Diagnosis not present

## 2014-06-10 DIAGNOSIS — M47817 Spondylosis without myelopathy or radiculopathy, lumbosacral region: Secondary | ICD-10-CM | POA: Diagnosis not present

## 2014-06-10 DIAGNOSIS — R262 Difficulty in walking, not elsewhere classified: Secondary | ICD-10-CM | POA: Diagnosis not present

## 2014-06-10 DIAGNOSIS — M6281 Muscle weakness (generalized): Secondary | ICD-10-CM | POA: Diagnosis not present

## 2014-06-13 DIAGNOSIS — M47817 Spondylosis without myelopathy or radiculopathy, lumbosacral region: Secondary | ICD-10-CM | POA: Diagnosis not present

## 2014-06-13 DIAGNOSIS — M545 Low back pain, unspecified: Secondary | ICD-10-CM | POA: Diagnosis not present

## 2014-06-13 DIAGNOSIS — R262 Difficulty in walking, not elsewhere classified: Secondary | ICD-10-CM | POA: Diagnosis not present

## 2014-06-13 DIAGNOSIS — M6281 Muscle weakness (generalized): Secondary | ICD-10-CM | POA: Diagnosis not present

## 2014-06-14 DIAGNOSIS — M25569 Pain in unspecified knee: Secondary | ICD-10-CM | POA: Diagnosis not present

## 2014-06-15 DIAGNOSIS — R262 Difficulty in walking, not elsewhere classified: Secondary | ICD-10-CM | POA: Diagnosis not present

## 2014-06-15 DIAGNOSIS — M545 Low back pain, unspecified: Secondary | ICD-10-CM | POA: Diagnosis not present

## 2014-06-15 DIAGNOSIS — M47817 Spondylosis without myelopathy or radiculopathy, lumbosacral region: Secondary | ICD-10-CM | POA: Diagnosis not present

## 2014-06-15 DIAGNOSIS — M6281 Muscle weakness (generalized): Secondary | ICD-10-CM | POA: Diagnosis not present

## 2014-06-20 DIAGNOSIS — R7309 Other abnormal glucose: Secondary | ICD-10-CM | POA: Diagnosis not present

## 2014-06-20 DIAGNOSIS — Z0000 Encounter for general adult medical examination without abnormal findings: Secondary | ICD-10-CM | POA: Diagnosis not present

## 2014-06-20 DIAGNOSIS — Z79899 Other long term (current) drug therapy: Secondary | ICD-10-CM | POA: Diagnosis not present

## 2014-06-20 DIAGNOSIS — E785 Hyperlipidemia, unspecified: Secondary | ICD-10-CM | POA: Diagnosis not present

## 2014-06-20 DIAGNOSIS — I10 Essential (primary) hypertension: Secondary | ICD-10-CM | POA: Diagnosis not present

## 2014-06-20 DIAGNOSIS — E039 Hypothyroidism, unspecified: Secondary | ICD-10-CM | POA: Diagnosis not present

## 2014-06-20 DIAGNOSIS — R002 Palpitations: Secondary | ICD-10-CM | POA: Diagnosis not present

## 2014-06-21 DIAGNOSIS — M6281 Muscle weakness (generalized): Secondary | ICD-10-CM | POA: Diagnosis not present

## 2014-06-21 DIAGNOSIS — M47817 Spondylosis without myelopathy or radiculopathy, lumbosacral region: Secondary | ICD-10-CM | POA: Diagnosis not present

## 2014-06-21 DIAGNOSIS — M545 Low back pain, unspecified: Secondary | ICD-10-CM | POA: Diagnosis not present

## 2014-08-17 DIAGNOSIS — D649 Anemia, unspecified: Secondary | ICD-10-CM | POA: Diagnosis not present

## 2014-09-13 ENCOUNTER — Other Ambulatory Visit (HOSPITAL_COMMUNITY): Payer: Self-pay | Admitting: Cardiology

## 2014-09-13 ENCOUNTER — Ambulatory Visit (HOSPITAL_COMMUNITY): Payer: Medicare Other | Attending: Orthopedic Surgery | Admitting: Cardiology

## 2014-09-13 DIAGNOSIS — M7989 Other specified soft tissue disorders: Secondary | ICD-10-CM | POA: Diagnosis not present

## 2014-09-13 DIAGNOSIS — E119 Type 2 diabetes mellitus without complications: Secondary | ICD-10-CM | POA: Insufficient documentation

## 2014-09-13 DIAGNOSIS — I10 Essential (primary) hypertension: Secondary | ICD-10-CM | POA: Diagnosis not present

## 2014-09-13 DIAGNOSIS — E785 Hyperlipidemia, unspecified: Secondary | ICD-10-CM | POA: Insufficient documentation

## 2014-09-13 DIAGNOSIS — M79605 Pain in left leg: Secondary | ICD-10-CM | POA: Diagnosis not present

## 2014-09-13 DIAGNOSIS — Z87891 Personal history of nicotine dependence: Secondary | ICD-10-CM | POA: Insufficient documentation

## 2014-09-13 NOTE — Progress Notes (Signed)
Left lower venous duplex performed  

## 2014-09-28 DIAGNOSIS — Z23 Encounter for immunization: Secondary | ICD-10-CM | POA: Diagnosis not present

## 2014-09-28 DIAGNOSIS — R7309 Other abnormal glucose: Secondary | ICD-10-CM | POA: Diagnosis not present

## 2014-09-28 DIAGNOSIS — I10 Essential (primary) hypertension: Secondary | ICD-10-CM | POA: Diagnosis not present

## 2014-09-28 DIAGNOSIS — E785 Hyperlipidemia, unspecified: Secondary | ICD-10-CM | POA: Diagnosis not present

## 2014-09-28 DIAGNOSIS — R109 Unspecified abdominal pain: Secondary | ICD-10-CM | POA: Diagnosis not present

## 2014-09-28 DIAGNOSIS — E039 Hypothyroidism, unspecified: Secondary | ICD-10-CM | POA: Diagnosis not present

## 2014-09-28 DIAGNOSIS — Z79899 Other long term (current) drug therapy: Secondary | ICD-10-CM | POA: Diagnosis not present

## 2015-01-10 DIAGNOSIS — E039 Hypothyroidism, unspecified: Secondary | ICD-10-CM | POA: Diagnosis not present

## 2015-01-10 DIAGNOSIS — E785 Hyperlipidemia, unspecified: Secondary | ICD-10-CM | POA: Diagnosis not present

## 2015-01-10 DIAGNOSIS — I10 Essential (primary) hypertension: Secondary | ICD-10-CM | POA: Diagnosis not present

## 2015-01-10 DIAGNOSIS — R0602 Shortness of breath: Secondary | ICD-10-CM | POA: Diagnosis not present

## 2015-01-11 DIAGNOSIS — R0602 Shortness of breath: Secondary | ICD-10-CM | POA: Diagnosis not present

## 2015-01-16 DIAGNOSIS — R0602 Shortness of breath: Secondary | ICD-10-CM | POA: Diagnosis not present

## 2015-01-16 DIAGNOSIS — I10 Essential (primary) hypertension: Secondary | ICD-10-CM | POA: Diagnosis not present

## 2015-01-18 DIAGNOSIS — R0602 Shortness of breath: Secondary | ICD-10-CM | POA: Diagnosis not present

## 2015-01-20 DIAGNOSIS — E11321 Type 2 diabetes mellitus with mild nonproliferative diabetic retinopathy with macular edema: Secondary | ICD-10-CM | POA: Diagnosis not present

## 2015-01-20 DIAGNOSIS — H2513 Age-related nuclear cataract, bilateral: Secondary | ICD-10-CM | POA: Diagnosis not present

## 2015-01-25 DIAGNOSIS — R0602 Shortness of breath: Secondary | ICD-10-CM | POA: Diagnosis not present

## 2015-01-25 DIAGNOSIS — I119 Hypertensive heart disease without heart failure: Secondary | ICD-10-CM | POA: Diagnosis not present

## 2015-02-27 ENCOUNTER — Encounter (INDEPENDENT_AMBULATORY_CARE_PROVIDER_SITE_OTHER): Payer: Medicare Other | Admitting: Ophthalmology

## 2015-02-27 DIAGNOSIS — H2513 Age-related nuclear cataract, bilateral: Secondary | ICD-10-CM

## 2015-02-27 DIAGNOSIS — H3531 Nonexudative age-related macular degeneration: Secondary | ICD-10-CM

## 2015-02-27 DIAGNOSIS — E11329 Type 2 diabetes mellitus with mild nonproliferative diabetic retinopathy without macular edema: Secondary | ICD-10-CM | POA: Diagnosis not present

## 2015-02-27 DIAGNOSIS — E11319 Type 2 diabetes mellitus with unspecified diabetic retinopathy without macular edema: Secondary | ICD-10-CM

## 2015-02-27 DIAGNOSIS — I10 Essential (primary) hypertension: Secondary | ICD-10-CM

## 2015-02-27 DIAGNOSIS — H35033 Hypertensive retinopathy, bilateral: Secondary | ICD-10-CM

## 2015-02-27 DIAGNOSIS — H43813 Vitreous degeneration, bilateral: Secondary | ICD-10-CM | POA: Diagnosis not present

## 2015-04-17 DIAGNOSIS — R7309 Other abnormal glucose: Secondary | ICD-10-CM | POA: Diagnosis not present

## 2015-04-17 DIAGNOSIS — K219 Gastro-esophageal reflux disease without esophagitis: Secondary | ICD-10-CM | POA: Diagnosis not present

## 2015-04-17 DIAGNOSIS — I10 Essential (primary) hypertension: Secondary | ICD-10-CM | POA: Diagnosis not present

## 2015-04-17 DIAGNOSIS — E041 Nontoxic single thyroid nodule: Secondary | ICD-10-CM | POA: Diagnosis not present

## 2015-04-17 DIAGNOSIS — R0602 Shortness of breath: Secondary | ICD-10-CM | POA: Diagnosis not present

## 2015-04-17 DIAGNOSIS — E039 Hypothyroidism, unspecified: Secondary | ICD-10-CM | POA: Diagnosis not present

## 2015-04-19 ENCOUNTER — Other Ambulatory Visit: Payer: Self-pay | Admitting: Family Medicine

## 2015-04-19 DIAGNOSIS — E041 Nontoxic single thyroid nodule: Secondary | ICD-10-CM

## 2015-04-25 ENCOUNTER — Ambulatory Visit
Admission: RE | Admit: 2015-04-25 | Discharge: 2015-04-25 | Disposition: A | Discharge status: Home / Self Care | Payer: Medicare Other | Source: Ambulatory Visit | Attending: Family Medicine | Admitting: Family Medicine

## 2015-04-25 DIAGNOSIS — Z9089 Acquired absence of other organs: Secondary | ICD-10-CM | POA: Diagnosis not present

## 2015-04-25 DIAGNOSIS — E041 Nontoxic single thyroid nodule: Secondary | ICD-10-CM | POA: Diagnosis not present

## 2015-09-15 DIAGNOSIS — Z0001 Encounter for general adult medical examination with abnormal findings: Secondary | ICD-10-CM | POA: Diagnosis not present

## 2015-09-15 DIAGNOSIS — Z79899 Other long term (current) drug therapy: Secondary | ICD-10-CM | POA: Diagnosis not present

## 2015-09-15 DIAGNOSIS — E784 Other hyperlipidemia: Secondary | ICD-10-CM | POA: Diagnosis not present

## 2015-09-15 DIAGNOSIS — Z23 Encounter for immunization: Secondary | ICD-10-CM | POA: Diagnosis not present

## 2015-09-15 DIAGNOSIS — E113219 Type 2 diabetes mellitus with mild nonproliferative diabetic retinopathy with macular edema, unspecified eye: Secondary | ICD-10-CM | POA: Diagnosis not present

## 2015-09-15 DIAGNOSIS — K219 Gastro-esophageal reflux disease without esophagitis: Secondary | ICD-10-CM | POA: Diagnosis not present

## 2015-09-15 DIAGNOSIS — E039 Hypothyroidism, unspecified: Secondary | ICD-10-CM | POA: Diagnosis not present

## 2015-09-15 DIAGNOSIS — I10 Essential (primary) hypertension: Secondary | ICD-10-CM | POA: Diagnosis not present

## 2015-09-15 DIAGNOSIS — E041 Nontoxic single thyroid nodule: Secondary | ICD-10-CM | POA: Diagnosis not present

## 2016-02-27 ENCOUNTER — Ambulatory Visit (INDEPENDENT_AMBULATORY_CARE_PROVIDER_SITE_OTHER): Payer: Medicare Other | Admitting: Ophthalmology

## 2016-03-04 DIAGNOSIS — R0602 Shortness of breath: Secondary | ICD-10-CM | POA: Diagnosis not present

## 2016-03-04 DIAGNOSIS — I119 Hypertensive heart disease without heart failure: Secondary | ICD-10-CM | POA: Diagnosis not present

## 2016-04-10 DIAGNOSIS — K219 Gastro-esophageal reflux disease without esophagitis: Secondary | ICD-10-CM | POA: Diagnosis not present

## 2016-04-10 DIAGNOSIS — M4806 Spinal stenosis, lumbar region: Secondary | ICD-10-CM | POA: Diagnosis not present

## 2016-04-10 DIAGNOSIS — E7800 Pure hypercholesterolemia, unspecified: Secondary | ICD-10-CM | POA: Diagnosis not present

## 2016-04-10 DIAGNOSIS — I10 Essential (primary) hypertension: Secondary | ICD-10-CM | POA: Diagnosis not present

## 2016-04-10 DIAGNOSIS — E039 Hypothyroidism, unspecified: Secondary | ICD-10-CM | POA: Diagnosis not present

## 2016-04-10 DIAGNOSIS — Z79899 Other long term (current) drug therapy: Secondary | ICD-10-CM | POA: Diagnosis not present

## 2016-04-10 DIAGNOSIS — E041 Nontoxic single thyroid nodule: Secondary | ICD-10-CM | POA: Diagnosis not present

## 2016-04-10 DIAGNOSIS — R7301 Impaired fasting glucose: Secondary | ICD-10-CM | POA: Diagnosis not present

## 2016-04-10 DIAGNOSIS — R7303 Prediabetes: Secondary | ICD-10-CM | POA: Diagnosis not present

## 2016-04-30 ENCOUNTER — Other Ambulatory Visit: Payer: Self-pay | Admitting: Family Medicine

## 2016-04-30 DIAGNOSIS — Z1231 Encounter for screening mammogram for malignant neoplasm of breast: Secondary | ICD-10-CM

## 2016-05-17 ENCOUNTER — Ambulatory Visit
Admission: RE | Admit: 2016-05-17 | Discharge: 2016-05-17 | Disposition: A | Discharge status: Home / Self Care | Payer: Medicare Other | Source: Ambulatory Visit | Attending: Family Medicine | Admitting: Family Medicine

## 2016-05-17 DIAGNOSIS — Z1231 Encounter for screening mammogram for malignant neoplasm of breast: Secondary | ICD-10-CM

## 2016-08-05 DIAGNOSIS — H40023 Open angle with borderline findings, high risk, bilateral: Secondary | ICD-10-CM | POA: Diagnosis not present

## 2016-08-05 DIAGNOSIS — E113213 Type 2 diabetes mellitus with mild nonproliferative diabetic retinopathy with macular edema, bilateral: Secondary | ICD-10-CM | POA: Diagnosis not present

## 2016-08-05 DIAGNOSIS — H04123 Dry eye syndrome of bilateral lacrimal glands: Secondary | ICD-10-CM | POA: Diagnosis not present

## 2016-10-14 DIAGNOSIS — Z79899 Other long term (current) drug therapy: Secondary | ICD-10-CM | POA: Diagnosis not present

## 2016-10-14 DIAGNOSIS — R7303 Prediabetes: Secondary | ICD-10-CM | POA: Diagnosis not present

## 2016-10-14 DIAGNOSIS — Z0001 Encounter for general adult medical examination with abnormal findings: Secondary | ICD-10-CM | POA: Diagnosis not present

## 2016-10-14 DIAGNOSIS — K219 Gastro-esophageal reflux disease without esophagitis: Secondary | ICD-10-CM | POA: Diagnosis not present

## 2016-10-14 DIAGNOSIS — E041 Nontoxic single thyroid nodule: Secondary | ICD-10-CM | POA: Diagnosis not present

## 2016-10-14 DIAGNOSIS — E039 Hypothyroidism, unspecified: Secondary | ICD-10-CM | POA: Diagnosis not present

## 2016-10-14 DIAGNOSIS — E7800 Pure hypercholesterolemia, unspecified: Secondary | ICD-10-CM | POA: Diagnosis not present

## 2016-10-14 DIAGNOSIS — R1013 Epigastric pain: Secondary | ICD-10-CM | POA: Diagnosis not present

## 2016-10-14 DIAGNOSIS — I10 Essential (primary) hypertension: Secondary | ICD-10-CM | POA: Diagnosis not present

## 2016-10-28 DIAGNOSIS — N183 Chronic kidney disease, stage 3 (moderate): Secondary | ICD-10-CM | POA: Diagnosis not present

## 2016-10-28 DIAGNOSIS — Z23 Encounter for immunization: Secondary | ICD-10-CM | POA: Diagnosis not present

## 2016-12-10 DIAGNOSIS — N83201 Unspecified ovarian cyst, right side: Secondary | ICD-10-CM | POA: Diagnosis not present

## 2016-12-10 DIAGNOSIS — N83202 Unspecified ovarian cyst, left side: Secondary | ICD-10-CM | POA: Diagnosis not present

## 2016-12-10 DIAGNOSIS — Z01411 Encounter for gynecological examination (general) (routine) with abnormal findings: Secondary | ICD-10-CM | POA: Diagnosis not present

## 2016-12-26 DIAGNOSIS — N83201 Unspecified ovarian cyst, right side: Secondary | ICD-10-CM | POA: Diagnosis not present

## 2016-12-26 DIAGNOSIS — N83202 Unspecified ovarian cyst, left side: Secondary | ICD-10-CM | POA: Diagnosis not present

## 2017-01-27 ENCOUNTER — Other Ambulatory Visit: Payer: Self-pay | Admitting: Gastroenterology

## 2017-01-27 DIAGNOSIS — K219 Gastro-esophageal reflux disease without esophagitis: Secondary | ICD-10-CM | POA: Diagnosis not present

## 2017-01-27 DIAGNOSIS — R932 Abnormal findings on diagnostic imaging of liver and biliary tract: Secondary | ICD-10-CM | POA: Diagnosis not present

## 2017-01-27 DIAGNOSIS — K5900 Constipation, unspecified: Secondary | ICD-10-CM

## 2017-01-27 DIAGNOSIS — K5901 Slow transit constipation: Secondary | ICD-10-CM | POA: Diagnosis not present

## 2017-01-27 DIAGNOSIS — R1084 Generalized abdominal pain: Secondary | ICD-10-CM | POA: Diagnosis not present

## 2017-01-27 DIAGNOSIS — R109 Unspecified abdominal pain: Secondary | ICD-10-CM

## 2017-01-30 ENCOUNTER — Ambulatory Visit
Admission: RE | Admit: 2017-01-30 | Discharge: 2017-01-30 | Disposition: A | Discharge status: Home / Self Care | Payer: Medicare Other | Source: Ambulatory Visit | Attending: Gastroenterology | Admitting: Gastroenterology

## 2017-01-30 DIAGNOSIS — K5900 Constipation, unspecified: Secondary | ICD-10-CM | POA: Diagnosis not present

## 2017-01-30 DIAGNOSIS — R109 Unspecified abdominal pain: Secondary | ICD-10-CM | POA: Diagnosis not present

## 2017-02-10 DIAGNOSIS — M545 Low back pain: Secondary | ICD-10-CM | POA: Diagnosis not present

## 2017-02-10 DIAGNOSIS — I10 Essential (primary) hypertension: Secondary | ICD-10-CM | POA: Diagnosis not present

## 2017-02-10 DIAGNOSIS — Z6838 Body mass index (BMI) 38.0-38.9, adult: Secondary | ICD-10-CM | POA: Diagnosis not present

## 2017-02-10 DIAGNOSIS — M5416 Radiculopathy, lumbar region: Secondary | ICD-10-CM | POA: Diagnosis not present

## 2017-02-10 DIAGNOSIS — M4120 Other idiopathic scoliosis, site unspecified: Secondary | ICD-10-CM | POA: Diagnosis not present

## 2017-02-27 DIAGNOSIS — M545 Low back pain: Secondary | ICD-10-CM | POA: Diagnosis not present

## 2017-02-27 DIAGNOSIS — M5416 Radiculopathy, lumbar region: Secondary | ICD-10-CM | POA: Diagnosis not present

## 2017-03-10 DIAGNOSIS — R0602 Shortness of breath: Secondary | ICD-10-CM | POA: Diagnosis not present

## 2017-03-10 DIAGNOSIS — I119 Hypertensive heart disease without heart failure: Secondary | ICD-10-CM | POA: Diagnosis not present

## 2017-03-10 DIAGNOSIS — E669 Obesity, unspecified: Secondary | ICD-10-CM | POA: Diagnosis not present

## 2017-03-20 DIAGNOSIS — D126 Benign neoplasm of colon, unspecified: Secondary | ICD-10-CM | POA: Diagnosis not present

## 2017-03-20 DIAGNOSIS — K5730 Diverticulosis of large intestine without perforation or abscess without bleeding: Secondary | ICD-10-CM | POA: Diagnosis not present

## 2017-03-20 DIAGNOSIS — K449 Diaphragmatic hernia without obstruction or gangrene: Secondary | ICD-10-CM | POA: Diagnosis not present

## 2017-03-20 DIAGNOSIS — K635 Polyp of colon: Secondary | ICD-10-CM | POA: Diagnosis not present

## 2017-03-20 DIAGNOSIS — Z86010 Personal history of colonic polyps: Secondary | ICD-10-CM | POA: Diagnosis not present

## 2017-03-20 DIAGNOSIS — K317 Polyp of stomach and duodenum: Secondary | ICD-10-CM | POA: Diagnosis not present

## 2017-03-20 DIAGNOSIS — K219 Gastro-esophageal reflux disease without esophagitis: Secondary | ICD-10-CM | POA: Diagnosis not present

## 2017-03-24 DIAGNOSIS — Z6839 Body mass index (BMI) 39.0-39.9, adult: Secondary | ICD-10-CM | POA: Diagnosis not present

## 2017-03-24 DIAGNOSIS — Z6838 Body mass index (BMI) 38.0-38.9, adult: Secondary | ICD-10-CM | POA: Diagnosis not present

## 2017-03-24 DIAGNOSIS — M545 Low back pain: Secondary | ICD-10-CM | POA: Diagnosis not present

## 2017-03-24 DIAGNOSIS — M5416 Radiculopathy, lumbar region: Secondary | ICD-10-CM | POA: Diagnosis not present

## 2017-03-24 DIAGNOSIS — I10 Essential (primary) hypertension: Secondary | ICD-10-CM | POA: Diagnosis not present

## 2017-03-24 DIAGNOSIS — M4120 Other idiopathic scoliosis, site unspecified: Secondary | ICD-10-CM | POA: Diagnosis not present

## 2017-03-25 DIAGNOSIS — K635 Polyp of colon: Secondary | ICD-10-CM | POA: Diagnosis not present

## 2017-03-25 DIAGNOSIS — D126 Benign neoplasm of colon, unspecified: Secondary | ICD-10-CM | POA: Diagnosis not present

## 2017-03-25 DIAGNOSIS — K317 Polyp of stomach and duodenum: Secondary | ICD-10-CM | POA: Diagnosis not present

## 2017-03-28 DIAGNOSIS — M546 Pain in thoracic spine: Secondary | ICD-10-CM | POA: Diagnosis not present

## 2017-03-28 DIAGNOSIS — M545 Low back pain: Secondary | ICD-10-CM | POA: Diagnosis not present

## 2017-04-02 DIAGNOSIS — M546 Pain in thoracic spine: Secondary | ICD-10-CM | POA: Diagnosis not present

## 2017-04-02 DIAGNOSIS — M545 Low back pain: Secondary | ICD-10-CM | POA: Diagnosis not present

## 2017-04-03 DIAGNOSIS — M545 Low back pain: Secondary | ICD-10-CM | POA: Diagnosis not present

## 2017-04-03 DIAGNOSIS — M546 Pain in thoracic spine: Secondary | ICD-10-CM | POA: Diagnosis not present

## 2017-04-08 DIAGNOSIS — M546 Pain in thoracic spine: Secondary | ICD-10-CM | POA: Diagnosis not present

## 2017-04-08 DIAGNOSIS — M545 Low back pain: Secondary | ICD-10-CM | POA: Diagnosis not present

## 2017-04-10 DIAGNOSIS — M545 Low back pain: Secondary | ICD-10-CM | POA: Diagnosis not present

## 2017-04-10 DIAGNOSIS — M546 Pain in thoracic spine: Secondary | ICD-10-CM | POA: Diagnosis not present

## 2017-04-14 DIAGNOSIS — G4733 Obstructive sleep apnea (adult) (pediatric): Secondary | ICD-10-CM | POA: Diagnosis not present

## 2017-04-14 DIAGNOSIS — E041 Nontoxic single thyroid nodule: Secondary | ICD-10-CM | POA: Diagnosis not present

## 2017-04-14 DIAGNOSIS — K219 Gastro-esophageal reflux disease without esophagitis: Secondary | ICD-10-CM | POA: Diagnosis not present

## 2017-04-14 DIAGNOSIS — N183 Chronic kidney disease, stage 3 (moderate): Secondary | ICD-10-CM | POA: Diagnosis not present

## 2017-04-14 DIAGNOSIS — E039 Hypothyroidism, unspecified: Secondary | ICD-10-CM | POA: Diagnosis not present

## 2017-04-14 DIAGNOSIS — R7303 Prediabetes: Secondary | ICD-10-CM | POA: Diagnosis not present

## 2017-04-14 DIAGNOSIS — E7800 Pure hypercholesterolemia, unspecified: Secondary | ICD-10-CM | POA: Diagnosis not present

## 2017-04-14 DIAGNOSIS — I10 Essential (primary) hypertension: Secondary | ICD-10-CM | POA: Diagnosis not present

## 2017-04-15 DIAGNOSIS — M545 Low back pain: Secondary | ICD-10-CM | POA: Diagnosis not present

## 2017-04-15 DIAGNOSIS — M546 Pain in thoracic spine: Secondary | ICD-10-CM | POA: Diagnosis not present

## 2017-04-16 ENCOUNTER — Other Ambulatory Visit: Payer: Self-pay | Admitting: Family Medicine

## 2017-04-16 DIAGNOSIS — E041 Nontoxic single thyroid nodule: Secondary | ICD-10-CM

## 2017-04-17 DIAGNOSIS — M545 Low back pain: Secondary | ICD-10-CM | POA: Diagnosis not present

## 2017-04-17 DIAGNOSIS — M546 Pain in thoracic spine: Secondary | ICD-10-CM | POA: Diagnosis not present

## 2017-04-22 DIAGNOSIS — M546 Pain in thoracic spine: Secondary | ICD-10-CM | POA: Diagnosis not present

## 2017-04-22 DIAGNOSIS — M545 Low back pain: Secondary | ICD-10-CM | POA: Diagnosis not present

## 2017-04-23 ENCOUNTER — Ambulatory Visit
Admission: RE | Admit: 2017-04-23 | Discharge: 2017-04-23 | Disposition: A | Discharge status: Home / Self Care | Payer: Medicare Other | Source: Ambulatory Visit | Attending: Family Medicine | Admitting: Family Medicine

## 2017-04-23 DIAGNOSIS — E041 Nontoxic single thyroid nodule: Secondary | ICD-10-CM

## 2017-04-24 DIAGNOSIS — M545 Low back pain: Secondary | ICD-10-CM | POA: Diagnosis not present

## 2017-04-24 DIAGNOSIS — M546 Pain in thoracic spine: Secondary | ICD-10-CM | POA: Diagnosis not present

## 2017-05-02 DIAGNOSIS — M545 Low back pain: Secondary | ICD-10-CM | POA: Diagnosis not present

## 2017-05-02 DIAGNOSIS — M546 Pain in thoracic spine: Secondary | ICD-10-CM | POA: Diagnosis not present

## 2017-05-06 DIAGNOSIS — M545 Low back pain: Secondary | ICD-10-CM | POA: Diagnosis not present

## 2017-05-06 DIAGNOSIS — M546 Pain in thoracic spine: Secondary | ICD-10-CM | POA: Diagnosis not present

## 2017-05-08 DIAGNOSIS — M545 Low back pain: Secondary | ICD-10-CM | POA: Diagnosis not present

## 2017-05-08 DIAGNOSIS — M546 Pain in thoracic spine: Secondary | ICD-10-CM | POA: Diagnosis not present

## 2017-05-13 DIAGNOSIS — M545 Low back pain: Secondary | ICD-10-CM | POA: Diagnosis not present

## 2017-05-13 DIAGNOSIS — M546 Pain in thoracic spine: Secondary | ICD-10-CM | POA: Diagnosis not present

## 2017-05-14 DIAGNOSIS — M4120 Other idiopathic scoliosis, site unspecified: Secondary | ICD-10-CM | POA: Diagnosis not present

## 2017-05-14 DIAGNOSIS — M47816 Spondylosis without myelopathy or radiculopathy, lumbar region: Secondary | ICD-10-CM | POA: Diagnosis not present

## 2017-05-15 DIAGNOSIS — M545 Low back pain: Secondary | ICD-10-CM | POA: Diagnosis not present

## 2017-05-15 DIAGNOSIS — M546 Pain in thoracic spine: Secondary | ICD-10-CM | POA: Diagnosis not present

## 2017-05-19 DIAGNOSIS — M47816 Spondylosis without myelopathy or radiculopathy, lumbar region: Secondary | ICD-10-CM | POA: Diagnosis not present

## 2017-05-20 DIAGNOSIS — M546 Pain in thoracic spine: Secondary | ICD-10-CM | POA: Diagnosis not present

## 2017-05-20 DIAGNOSIS — M545 Low back pain: Secondary | ICD-10-CM | POA: Diagnosis not present

## 2017-05-22 DIAGNOSIS — M546 Pain in thoracic spine: Secondary | ICD-10-CM | POA: Diagnosis not present

## 2017-05-22 DIAGNOSIS — M545 Low back pain: Secondary | ICD-10-CM | POA: Diagnosis not present

## 2017-06-10 DIAGNOSIS — M545 Low back pain: Secondary | ICD-10-CM | POA: Diagnosis not present

## 2017-06-10 DIAGNOSIS — M546 Pain in thoracic spine: Secondary | ICD-10-CM | POA: Diagnosis not present

## 2017-06-17 DIAGNOSIS — M545 Low back pain: Secondary | ICD-10-CM | POA: Diagnosis not present

## 2017-06-17 DIAGNOSIS — M546 Pain in thoracic spine: Secondary | ICD-10-CM | POA: Diagnosis not present

## 2017-06-19 DIAGNOSIS — M545 Low back pain: Secondary | ICD-10-CM | POA: Diagnosis not present

## 2017-06-19 DIAGNOSIS — M546 Pain in thoracic spine: Secondary | ICD-10-CM | POA: Diagnosis not present

## 2017-06-24 DIAGNOSIS — M545 Low back pain: Secondary | ICD-10-CM | POA: Diagnosis not present

## 2017-06-24 DIAGNOSIS — M546 Pain in thoracic spine: Secondary | ICD-10-CM | POA: Diagnosis not present

## 2017-06-30 DIAGNOSIS — M546 Pain in thoracic spine: Secondary | ICD-10-CM | POA: Diagnosis not present

## 2017-06-30 DIAGNOSIS — M545 Low back pain: Secondary | ICD-10-CM | POA: Diagnosis not present

## 2017-07-01 DIAGNOSIS — M47816 Spondylosis without myelopathy or radiculopathy, lumbar region: Secondary | ICD-10-CM | POA: Diagnosis not present

## 2017-07-03 DIAGNOSIS — M545 Low back pain: Secondary | ICD-10-CM | POA: Diagnosis not present

## 2017-07-03 DIAGNOSIS — M546 Pain in thoracic spine: Secondary | ICD-10-CM | POA: Diagnosis not present

## 2017-07-08 DIAGNOSIS — M545 Low back pain: Secondary | ICD-10-CM | POA: Diagnosis not present

## 2017-07-08 DIAGNOSIS — M546 Pain in thoracic spine: Secondary | ICD-10-CM | POA: Diagnosis not present

## 2017-07-10 DIAGNOSIS — M546 Pain in thoracic spine: Secondary | ICD-10-CM | POA: Diagnosis not present

## 2017-07-10 DIAGNOSIS — M545 Low back pain: Secondary | ICD-10-CM | POA: Diagnosis not present

## 2017-07-14 DIAGNOSIS — M47816 Spondylosis without myelopathy or radiculopathy, lumbar region: Secondary | ICD-10-CM | POA: Diagnosis not present

## 2017-07-14 DIAGNOSIS — M4120 Other idiopathic scoliosis, site unspecified: Secondary | ICD-10-CM | POA: Diagnosis not present

## 2017-07-14 DIAGNOSIS — I10 Essential (primary) hypertension: Secondary | ICD-10-CM | POA: Diagnosis not present

## 2017-07-14 DIAGNOSIS — Z6837 Body mass index (BMI) 37.0-37.9, adult: Secondary | ICD-10-CM | POA: Diagnosis not present

## 2017-07-15 DIAGNOSIS — M546 Pain in thoracic spine: Secondary | ICD-10-CM | POA: Diagnosis not present

## 2017-07-15 DIAGNOSIS — M545 Low back pain: Secondary | ICD-10-CM | POA: Diagnosis not present

## 2017-07-17 DIAGNOSIS — M546 Pain in thoracic spine: Secondary | ICD-10-CM | POA: Diagnosis not present

## 2017-07-17 DIAGNOSIS — M545 Low back pain: Secondary | ICD-10-CM | POA: Diagnosis not present

## 2017-07-22 DIAGNOSIS — E113211 Type 2 diabetes mellitus with mild nonproliferative diabetic retinopathy with macular edema, right eye: Secondary | ICD-10-CM | POA: Diagnosis not present

## 2017-07-22 DIAGNOSIS — H40013 Open angle with borderline findings, low risk, bilateral: Secondary | ICD-10-CM | POA: Diagnosis not present

## 2017-07-22 DIAGNOSIS — H524 Presbyopia: Secondary | ICD-10-CM | POA: Diagnosis not present

## 2017-07-22 DIAGNOSIS — H353131 Nonexudative age-related macular degeneration, bilateral, early dry stage: Secondary | ICD-10-CM | POA: Diagnosis not present

## 2017-08-05 DIAGNOSIS — M47816 Spondylosis without myelopathy or radiculopathy, lumbar region: Secondary | ICD-10-CM | POA: Diagnosis not present

## 2017-08-05 DIAGNOSIS — M4120 Other idiopathic scoliosis, site unspecified: Secondary | ICD-10-CM | POA: Diagnosis not present

## 2017-08-21 DIAGNOSIS — H25812 Combined forms of age-related cataract, left eye: Secondary | ICD-10-CM | POA: Diagnosis not present

## 2017-08-21 DIAGNOSIS — H25012 Cortical age-related cataract, left eye: Secondary | ICD-10-CM | POA: Diagnosis not present

## 2017-08-21 DIAGNOSIS — H2512 Age-related nuclear cataract, left eye: Secondary | ICD-10-CM | POA: Diagnosis not present

## 2017-09-11 DIAGNOSIS — H25011 Cortical age-related cataract, right eye: Secondary | ICD-10-CM | POA: Diagnosis not present

## 2017-09-11 DIAGNOSIS — H2511 Age-related nuclear cataract, right eye: Secondary | ICD-10-CM | POA: Diagnosis not present

## 2017-09-11 DIAGNOSIS — H25811 Combined forms of age-related cataract, right eye: Secondary | ICD-10-CM | POA: Diagnosis not present

## 2017-09-15 DIAGNOSIS — M4120 Other idiopathic scoliosis, site unspecified: Secondary | ICD-10-CM | POA: Diagnosis not present

## 2017-09-15 DIAGNOSIS — M545 Low back pain: Secondary | ICD-10-CM | POA: Diagnosis not present

## 2017-09-15 DIAGNOSIS — M47816 Spondylosis without myelopathy or radiculopathy, lumbar region: Secondary | ICD-10-CM | POA: Diagnosis not present

## 2017-09-15 DIAGNOSIS — M5416 Radiculopathy, lumbar region: Secondary | ICD-10-CM | POA: Diagnosis not present

## 2017-10-01 DIAGNOSIS — R531 Weakness: Secondary | ICD-10-CM | POA: Diagnosis not present

## 2017-10-01 DIAGNOSIS — R262 Difficulty in walking, not elsewhere classified: Secondary | ICD-10-CM | POA: Diagnosis not present

## 2017-10-01 DIAGNOSIS — M79605 Pain in left leg: Secondary | ICD-10-CM | POA: Diagnosis not present

## 2017-10-01 DIAGNOSIS — M545 Low back pain: Secondary | ICD-10-CM | POA: Diagnosis not present

## 2017-10-01 DIAGNOSIS — R296 Repeated falls: Secondary | ICD-10-CM | POA: Diagnosis not present

## 2017-10-07 DIAGNOSIS — M79605 Pain in left leg: Secondary | ICD-10-CM | POA: Diagnosis not present

## 2017-10-07 DIAGNOSIS — R296 Repeated falls: Secondary | ICD-10-CM | POA: Diagnosis not present

## 2017-10-07 DIAGNOSIS — R262 Difficulty in walking, not elsewhere classified: Secondary | ICD-10-CM | POA: Diagnosis not present

## 2017-10-07 DIAGNOSIS — R531 Weakness: Secondary | ICD-10-CM | POA: Diagnosis not present

## 2017-10-07 DIAGNOSIS — M545 Low back pain: Secondary | ICD-10-CM | POA: Diagnosis not present

## 2017-10-14 DIAGNOSIS — R531 Weakness: Secondary | ICD-10-CM | POA: Diagnosis not present

## 2017-10-14 DIAGNOSIS — M545 Low back pain: Secondary | ICD-10-CM | POA: Diagnosis not present

## 2017-10-14 DIAGNOSIS — R296 Repeated falls: Secondary | ICD-10-CM | POA: Diagnosis not present

## 2017-10-14 DIAGNOSIS — R262 Difficulty in walking, not elsewhere classified: Secondary | ICD-10-CM | POA: Diagnosis not present

## 2017-10-14 DIAGNOSIS — M79605 Pain in left leg: Secondary | ICD-10-CM | POA: Diagnosis not present

## 2017-10-20 DIAGNOSIS — R7303 Prediabetes: Secondary | ICD-10-CM | POA: Diagnosis not present

## 2017-10-20 DIAGNOSIS — N183 Chronic kidney disease, stage 3 (moderate): Secondary | ICD-10-CM | POA: Diagnosis not present

## 2017-10-20 DIAGNOSIS — E039 Hypothyroidism, unspecified: Secondary | ICD-10-CM | POA: Diagnosis not present

## 2017-10-20 DIAGNOSIS — M419 Scoliosis, unspecified: Secondary | ICD-10-CM | POA: Diagnosis not present

## 2017-10-20 DIAGNOSIS — Z23 Encounter for immunization: Secondary | ICD-10-CM | POA: Diagnosis not present

## 2017-10-20 DIAGNOSIS — E041 Nontoxic single thyroid nodule: Secondary | ICD-10-CM | POA: Diagnosis not present

## 2017-10-20 DIAGNOSIS — Z79899 Other long term (current) drug therapy: Secondary | ICD-10-CM | POA: Diagnosis not present

## 2017-10-20 DIAGNOSIS — Z0001 Encounter for general adult medical examination with abnormal findings: Secondary | ICD-10-CM | POA: Diagnosis not present

## 2017-10-20 DIAGNOSIS — K219 Gastro-esophageal reflux disease without esophagitis: Secondary | ICD-10-CM | POA: Diagnosis not present

## 2017-10-20 DIAGNOSIS — I10 Essential (primary) hypertension: Secondary | ICD-10-CM | POA: Diagnosis not present

## 2017-10-20 DIAGNOSIS — R251 Tremor, unspecified: Secondary | ICD-10-CM | POA: Diagnosis not present

## 2017-10-20 DIAGNOSIS — Z1159 Encounter for screening for other viral diseases: Secondary | ICD-10-CM | POA: Diagnosis not present

## 2017-10-20 DIAGNOSIS — E7800 Pure hypercholesterolemia, unspecified: Secondary | ICD-10-CM | POA: Diagnosis not present

## 2017-10-21 DIAGNOSIS — M545 Low back pain: Secondary | ICD-10-CM | POA: Diagnosis not present

## 2017-10-21 DIAGNOSIS — R262 Difficulty in walking, not elsewhere classified: Secondary | ICD-10-CM | POA: Diagnosis not present

## 2017-10-21 DIAGNOSIS — R531 Weakness: Secondary | ICD-10-CM | POA: Diagnosis not present

## 2017-10-21 DIAGNOSIS — M79605 Pain in left leg: Secondary | ICD-10-CM | POA: Diagnosis not present

## 2017-10-21 DIAGNOSIS — R296 Repeated falls: Secondary | ICD-10-CM | POA: Diagnosis not present

## 2017-10-22 DIAGNOSIS — M4120 Other idiopathic scoliosis, site unspecified: Secondary | ICD-10-CM | POA: Diagnosis not present

## 2017-10-23 DIAGNOSIS — M79605 Pain in left leg: Secondary | ICD-10-CM | POA: Diagnosis not present

## 2017-10-23 DIAGNOSIS — R296 Repeated falls: Secondary | ICD-10-CM | POA: Diagnosis not present

## 2017-10-23 DIAGNOSIS — R262 Difficulty in walking, not elsewhere classified: Secondary | ICD-10-CM | POA: Diagnosis not present

## 2017-10-23 DIAGNOSIS — R531 Weakness: Secondary | ICD-10-CM | POA: Diagnosis not present

## 2017-10-23 DIAGNOSIS — M545 Low back pain: Secondary | ICD-10-CM | POA: Diagnosis not present

## 2017-10-28 DIAGNOSIS — R531 Weakness: Secondary | ICD-10-CM | POA: Diagnosis not present

## 2017-10-28 DIAGNOSIS — R262 Difficulty in walking, not elsewhere classified: Secondary | ICD-10-CM | POA: Diagnosis not present

## 2017-10-28 DIAGNOSIS — M545 Low back pain: Secondary | ICD-10-CM | POA: Diagnosis not present

## 2017-10-28 DIAGNOSIS — M79605 Pain in left leg: Secondary | ICD-10-CM | POA: Diagnosis not present

## 2017-10-28 DIAGNOSIS — R296 Repeated falls: Secondary | ICD-10-CM | POA: Diagnosis not present

## 2017-10-30 DIAGNOSIS — R531 Weakness: Secondary | ICD-10-CM | POA: Diagnosis not present

## 2017-10-30 DIAGNOSIS — R262 Difficulty in walking, not elsewhere classified: Secondary | ICD-10-CM | POA: Diagnosis not present

## 2017-10-30 DIAGNOSIS — M545 Low back pain: Secondary | ICD-10-CM | POA: Diagnosis not present

## 2017-10-30 DIAGNOSIS — R296 Repeated falls: Secondary | ICD-10-CM | POA: Diagnosis not present

## 2017-10-30 DIAGNOSIS — M79605 Pain in left leg: Secondary | ICD-10-CM | POA: Diagnosis not present

## 2017-11-11 DIAGNOSIS — R296 Repeated falls: Secondary | ICD-10-CM | POA: Diagnosis not present

## 2017-11-11 DIAGNOSIS — M79605 Pain in left leg: Secondary | ICD-10-CM | POA: Diagnosis not present

## 2017-11-11 DIAGNOSIS — R531 Weakness: Secondary | ICD-10-CM | POA: Diagnosis not present

## 2017-11-11 DIAGNOSIS — M545 Low back pain: Secondary | ICD-10-CM | POA: Diagnosis not present

## 2017-11-11 DIAGNOSIS — R262 Difficulty in walking, not elsewhere classified: Secondary | ICD-10-CM | POA: Diagnosis not present

## 2017-11-13 DIAGNOSIS — M545 Low back pain: Secondary | ICD-10-CM | POA: Diagnosis not present

## 2017-11-13 DIAGNOSIS — R296 Repeated falls: Secondary | ICD-10-CM | POA: Diagnosis not present

## 2017-11-13 DIAGNOSIS — R531 Weakness: Secondary | ICD-10-CM | POA: Diagnosis not present

## 2017-11-13 DIAGNOSIS — M79605 Pain in left leg: Secondary | ICD-10-CM | POA: Diagnosis not present

## 2017-11-13 DIAGNOSIS — R262 Difficulty in walking, not elsewhere classified: Secondary | ICD-10-CM | POA: Diagnosis not present

## 2017-11-24 DIAGNOSIS — R262 Difficulty in walking, not elsewhere classified: Secondary | ICD-10-CM | POA: Diagnosis not present

## 2017-11-24 DIAGNOSIS — M79605 Pain in left leg: Secondary | ICD-10-CM | POA: Diagnosis not present

## 2017-11-24 DIAGNOSIS — R296 Repeated falls: Secondary | ICD-10-CM | POA: Diagnosis not present

## 2017-11-24 DIAGNOSIS — R531 Weakness: Secondary | ICD-10-CM | POA: Diagnosis not present

## 2017-11-24 DIAGNOSIS — M545 Low back pain: Secondary | ICD-10-CM | POA: Diagnosis not present

## 2017-11-27 DIAGNOSIS — M545 Low back pain: Secondary | ICD-10-CM | POA: Diagnosis not present

## 2017-11-27 DIAGNOSIS — R296 Repeated falls: Secondary | ICD-10-CM | POA: Diagnosis not present

## 2017-11-27 DIAGNOSIS — R262 Difficulty in walking, not elsewhere classified: Secondary | ICD-10-CM | POA: Diagnosis not present

## 2017-11-27 DIAGNOSIS — R531 Weakness: Secondary | ICD-10-CM | POA: Diagnosis not present

## 2017-11-27 DIAGNOSIS — M79605 Pain in left leg: Secondary | ICD-10-CM | POA: Diagnosis not present

## 2017-12-01 DIAGNOSIS — R296 Repeated falls: Secondary | ICD-10-CM | POA: Diagnosis not present

## 2017-12-01 DIAGNOSIS — M545 Low back pain: Secondary | ICD-10-CM | POA: Diagnosis not present

## 2017-12-01 DIAGNOSIS — R531 Weakness: Secondary | ICD-10-CM | POA: Diagnosis not present

## 2017-12-01 DIAGNOSIS — M79605 Pain in left leg: Secondary | ICD-10-CM | POA: Diagnosis not present

## 2017-12-01 DIAGNOSIS — R262 Difficulty in walking, not elsewhere classified: Secondary | ICD-10-CM | POA: Diagnosis not present

## 2017-12-02 DIAGNOSIS — M5416 Radiculopathy, lumbar region: Secondary | ICD-10-CM | POA: Diagnosis not present

## 2017-12-04 DIAGNOSIS — M79605 Pain in left leg: Secondary | ICD-10-CM | POA: Diagnosis not present

## 2017-12-04 DIAGNOSIS — R531 Weakness: Secondary | ICD-10-CM | POA: Diagnosis not present

## 2017-12-04 DIAGNOSIS — R296 Repeated falls: Secondary | ICD-10-CM | POA: Diagnosis not present

## 2017-12-04 DIAGNOSIS — R262 Difficulty in walking, not elsewhere classified: Secondary | ICD-10-CM | POA: Diagnosis not present

## 2017-12-04 DIAGNOSIS — M545 Low back pain: Secondary | ICD-10-CM | POA: Diagnosis not present

## 2017-12-09 DIAGNOSIS — R262 Difficulty in walking, not elsewhere classified: Secondary | ICD-10-CM | POA: Diagnosis not present

## 2017-12-09 DIAGNOSIS — M79605 Pain in left leg: Secondary | ICD-10-CM | POA: Diagnosis not present

## 2017-12-09 DIAGNOSIS — M545 Low back pain: Secondary | ICD-10-CM | POA: Diagnosis not present

## 2017-12-09 DIAGNOSIS — R296 Repeated falls: Secondary | ICD-10-CM | POA: Diagnosis not present

## 2017-12-09 DIAGNOSIS — R531 Weakness: Secondary | ICD-10-CM | POA: Diagnosis not present

## 2017-12-12 DIAGNOSIS — H2013 Chronic iridocyclitis, bilateral: Secondary | ICD-10-CM | POA: Diagnosis not present

## 2017-12-16 DIAGNOSIS — R296 Repeated falls: Secondary | ICD-10-CM | POA: Diagnosis not present

## 2017-12-16 DIAGNOSIS — R262 Difficulty in walking, not elsewhere classified: Secondary | ICD-10-CM | POA: Diagnosis not present

## 2017-12-16 DIAGNOSIS — M545 Low back pain: Secondary | ICD-10-CM | POA: Diagnosis not present

## 2017-12-16 DIAGNOSIS — R531 Weakness: Secondary | ICD-10-CM | POA: Diagnosis not present

## 2017-12-16 DIAGNOSIS — M79605 Pain in left leg: Secondary | ICD-10-CM | POA: Diagnosis not present

## 2017-12-18 DIAGNOSIS — R531 Weakness: Secondary | ICD-10-CM | POA: Diagnosis not present

## 2017-12-18 DIAGNOSIS — M545 Low back pain: Secondary | ICD-10-CM | POA: Diagnosis not present

## 2017-12-18 DIAGNOSIS — R262 Difficulty in walking, not elsewhere classified: Secondary | ICD-10-CM | POA: Diagnosis not present

## 2017-12-18 DIAGNOSIS — R296 Repeated falls: Secondary | ICD-10-CM | POA: Diagnosis not present

## 2017-12-18 DIAGNOSIS — M79605 Pain in left leg: Secondary | ICD-10-CM | POA: Diagnosis not present

## 2017-12-22 DIAGNOSIS — H2000 Unspecified acute and subacute iridocyclitis: Secondary | ICD-10-CM | POA: Diagnosis not present

## 2017-12-23 DIAGNOSIS — Z780 Asymptomatic menopausal state: Secondary | ICD-10-CM | POA: Diagnosis not present

## 2017-12-25 DIAGNOSIS — R296 Repeated falls: Secondary | ICD-10-CM | POA: Diagnosis not present

## 2017-12-25 DIAGNOSIS — R531 Weakness: Secondary | ICD-10-CM | POA: Diagnosis not present

## 2017-12-25 DIAGNOSIS — R262 Difficulty in walking, not elsewhere classified: Secondary | ICD-10-CM | POA: Diagnosis not present

## 2017-12-25 DIAGNOSIS — M545 Low back pain: Secondary | ICD-10-CM | POA: Diagnosis not present

## 2017-12-25 DIAGNOSIS — M79605 Pain in left leg: Secondary | ICD-10-CM | POA: Diagnosis not present

## 2017-12-30 DIAGNOSIS — M79605 Pain in left leg: Secondary | ICD-10-CM | POA: Diagnosis not present

## 2017-12-30 DIAGNOSIS — R262 Difficulty in walking, not elsewhere classified: Secondary | ICD-10-CM | POA: Diagnosis not present

## 2017-12-30 DIAGNOSIS — R296 Repeated falls: Secondary | ICD-10-CM | POA: Diagnosis not present

## 2017-12-30 DIAGNOSIS — R531 Weakness: Secondary | ICD-10-CM | POA: Diagnosis not present

## 2017-12-30 DIAGNOSIS — M545 Low back pain: Secondary | ICD-10-CM | POA: Diagnosis not present

## 2018-01-01 DIAGNOSIS — M79605 Pain in left leg: Secondary | ICD-10-CM | POA: Diagnosis not present

## 2018-01-01 DIAGNOSIS — R296 Repeated falls: Secondary | ICD-10-CM | POA: Diagnosis not present

## 2018-01-01 DIAGNOSIS — R531 Weakness: Secondary | ICD-10-CM | POA: Diagnosis not present

## 2018-01-01 DIAGNOSIS — R262 Difficulty in walking, not elsewhere classified: Secondary | ICD-10-CM | POA: Diagnosis not present

## 2018-01-01 DIAGNOSIS — M545 Low back pain: Secondary | ICD-10-CM | POA: Diagnosis not present

## 2018-01-05 DIAGNOSIS — M4120 Other idiopathic scoliosis, site unspecified: Secondary | ICD-10-CM | POA: Diagnosis not present

## 2018-01-05 DIAGNOSIS — I10 Essential (primary) hypertension: Secondary | ICD-10-CM | POA: Diagnosis not present

## 2018-01-05 DIAGNOSIS — M545 Low back pain: Secondary | ICD-10-CM | POA: Diagnosis not present

## 2018-01-05 DIAGNOSIS — Z6838 Body mass index (BMI) 38.0-38.9, adult: Secondary | ICD-10-CM | POA: Diagnosis not present

## 2018-01-05 DIAGNOSIS — M5416 Radiculopathy, lumbar region: Secondary | ICD-10-CM | POA: Diagnosis not present

## 2018-01-06 DIAGNOSIS — H2013 Chronic iridocyclitis, bilateral: Secondary | ICD-10-CM | POA: Diagnosis not present

## 2018-01-08 DIAGNOSIS — R531 Weakness: Secondary | ICD-10-CM | POA: Diagnosis not present

## 2018-01-08 DIAGNOSIS — M545 Low back pain: Secondary | ICD-10-CM | POA: Diagnosis not present

## 2018-01-08 DIAGNOSIS — R296 Repeated falls: Secondary | ICD-10-CM | POA: Diagnosis not present

## 2018-01-08 DIAGNOSIS — R262 Difficulty in walking, not elsewhere classified: Secondary | ICD-10-CM | POA: Diagnosis not present

## 2018-01-08 DIAGNOSIS — M79605 Pain in left leg: Secondary | ICD-10-CM | POA: Diagnosis not present

## 2018-01-13 DIAGNOSIS — M545 Low back pain: Secondary | ICD-10-CM | POA: Diagnosis not present

## 2018-01-13 DIAGNOSIS — R262 Difficulty in walking, not elsewhere classified: Secondary | ICD-10-CM | POA: Diagnosis not present

## 2018-01-13 DIAGNOSIS — M79605 Pain in left leg: Secondary | ICD-10-CM | POA: Diagnosis not present

## 2018-01-13 DIAGNOSIS — R531 Weakness: Secondary | ICD-10-CM | POA: Diagnosis not present

## 2018-01-13 DIAGNOSIS — R296 Repeated falls: Secondary | ICD-10-CM | POA: Diagnosis not present

## 2018-01-20 DIAGNOSIS — R296 Repeated falls: Secondary | ICD-10-CM | POA: Diagnosis not present

## 2018-01-20 DIAGNOSIS — M545 Low back pain: Secondary | ICD-10-CM | POA: Diagnosis not present

## 2018-01-20 DIAGNOSIS — M79605 Pain in left leg: Secondary | ICD-10-CM | POA: Diagnosis not present

## 2018-01-20 DIAGNOSIS — H2013 Chronic iridocyclitis, bilateral: Secondary | ICD-10-CM | POA: Diagnosis not present

## 2018-01-20 DIAGNOSIS — R531 Weakness: Secondary | ICD-10-CM | POA: Diagnosis not present

## 2018-01-20 DIAGNOSIS — R262 Difficulty in walking, not elsewhere classified: Secondary | ICD-10-CM | POA: Diagnosis not present

## 2018-01-21 ENCOUNTER — Other Ambulatory Visit: Payer: Self-pay | Admitting: Family Medicine

## 2018-01-21 DIAGNOSIS — Z1231 Encounter for screening mammogram for malignant neoplasm of breast: Secondary | ICD-10-CM

## 2018-01-22 DIAGNOSIS — M545 Low back pain: Secondary | ICD-10-CM | POA: Diagnosis not present

## 2018-01-22 DIAGNOSIS — R531 Weakness: Secondary | ICD-10-CM | POA: Diagnosis not present

## 2018-01-22 DIAGNOSIS — R296 Repeated falls: Secondary | ICD-10-CM | POA: Diagnosis not present

## 2018-01-22 DIAGNOSIS — M79605 Pain in left leg: Secondary | ICD-10-CM | POA: Diagnosis not present

## 2018-01-22 DIAGNOSIS — R262 Difficulty in walking, not elsewhere classified: Secondary | ICD-10-CM | POA: Diagnosis not present

## 2018-01-26 DIAGNOSIS — M5416 Radiculopathy, lumbar region: Secondary | ICD-10-CM | POA: Diagnosis not present

## 2018-01-27 DIAGNOSIS — Z01419 Encounter for gynecological examination (general) (routine) without abnormal findings: Secondary | ICD-10-CM | POA: Diagnosis not present

## 2018-01-29 ENCOUNTER — Ambulatory Visit
Admission: RE | Admit: 2018-01-29 | Discharge: 2018-01-29 | Disposition: A | Discharge status: Home / Self Care | Payer: Medicare Other | Source: Ambulatory Visit | Attending: Family Medicine | Admitting: Family Medicine

## 2018-01-29 DIAGNOSIS — M79605 Pain in left leg: Secondary | ICD-10-CM | POA: Diagnosis not present

## 2018-01-29 DIAGNOSIS — R262 Difficulty in walking, not elsewhere classified: Secondary | ICD-10-CM | POA: Diagnosis not present

## 2018-01-29 DIAGNOSIS — M545 Low back pain: Secondary | ICD-10-CM | POA: Diagnosis not present

## 2018-01-29 DIAGNOSIS — Z1231 Encounter for screening mammogram for malignant neoplasm of breast: Secondary | ICD-10-CM | POA: Diagnosis not present

## 2018-01-29 DIAGNOSIS — R296 Repeated falls: Secondary | ICD-10-CM | POA: Diagnosis not present

## 2018-01-29 DIAGNOSIS — R531 Weakness: Secondary | ICD-10-CM | POA: Diagnosis not present

## 2018-01-30 DIAGNOSIS — R350 Frequency of micturition: Secondary | ICD-10-CM | POA: Diagnosis not present

## 2018-01-30 DIAGNOSIS — Z7409 Other reduced mobility: Secondary | ICD-10-CM | POA: Diagnosis not present

## 2018-02-03 DIAGNOSIS — M79605 Pain in left leg: Secondary | ICD-10-CM | POA: Diagnosis not present

## 2018-02-03 DIAGNOSIS — R262 Difficulty in walking, not elsewhere classified: Secondary | ICD-10-CM | POA: Diagnosis not present

## 2018-02-03 DIAGNOSIS — M545 Low back pain: Secondary | ICD-10-CM | POA: Diagnosis not present

## 2018-02-03 DIAGNOSIS — R296 Repeated falls: Secondary | ICD-10-CM | POA: Diagnosis not present

## 2018-02-03 DIAGNOSIS — R531 Weakness: Secondary | ICD-10-CM | POA: Diagnosis not present

## 2018-02-05 DIAGNOSIS — R296 Repeated falls: Secondary | ICD-10-CM | POA: Diagnosis not present

## 2018-02-05 DIAGNOSIS — M79605 Pain in left leg: Secondary | ICD-10-CM | POA: Diagnosis not present

## 2018-02-05 DIAGNOSIS — R262 Difficulty in walking, not elsewhere classified: Secondary | ICD-10-CM | POA: Diagnosis not present

## 2018-02-05 DIAGNOSIS — M545 Low back pain: Secondary | ICD-10-CM | POA: Diagnosis not present

## 2018-02-05 DIAGNOSIS — R531 Weakness: Secondary | ICD-10-CM | POA: Diagnosis not present

## 2018-02-10 DIAGNOSIS — M545 Low back pain: Secondary | ICD-10-CM | POA: Diagnosis not present

## 2018-02-10 DIAGNOSIS — R531 Weakness: Secondary | ICD-10-CM | POA: Diagnosis not present

## 2018-02-10 DIAGNOSIS — R262 Difficulty in walking, not elsewhere classified: Secondary | ICD-10-CM | POA: Diagnosis not present

## 2018-02-10 DIAGNOSIS — R296 Repeated falls: Secondary | ICD-10-CM | POA: Diagnosis not present

## 2018-02-10 DIAGNOSIS — M79605 Pain in left leg: Secondary | ICD-10-CM | POA: Diagnosis not present

## 2018-02-12 DIAGNOSIS — M79605 Pain in left leg: Secondary | ICD-10-CM | POA: Diagnosis not present

## 2018-02-12 DIAGNOSIS — R262 Difficulty in walking, not elsewhere classified: Secondary | ICD-10-CM | POA: Diagnosis not present

## 2018-02-12 DIAGNOSIS — R531 Weakness: Secondary | ICD-10-CM | POA: Diagnosis not present

## 2018-02-12 DIAGNOSIS — R296 Repeated falls: Secondary | ICD-10-CM | POA: Diagnosis not present

## 2018-02-12 DIAGNOSIS — M545 Low back pain: Secondary | ICD-10-CM | POA: Diagnosis not present

## 2018-02-23 DIAGNOSIS — M79605 Pain in left leg: Secondary | ICD-10-CM | POA: Diagnosis not present

## 2018-02-23 DIAGNOSIS — R531 Weakness: Secondary | ICD-10-CM | POA: Diagnosis not present

## 2018-02-23 DIAGNOSIS — M545 Low back pain: Secondary | ICD-10-CM | POA: Diagnosis not present

## 2018-02-23 DIAGNOSIS — R262 Difficulty in walking, not elsewhere classified: Secondary | ICD-10-CM | POA: Diagnosis not present

## 2018-02-23 DIAGNOSIS — R296 Repeated falls: Secondary | ICD-10-CM | POA: Diagnosis not present

## 2018-02-24 DIAGNOSIS — H2013 Chronic iridocyclitis, bilateral: Secondary | ICD-10-CM | POA: Diagnosis not present

## 2018-02-24 DIAGNOSIS — H40013 Open angle with borderline findings, low risk, bilateral: Secondary | ICD-10-CM | POA: Diagnosis not present

## 2018-02-26 DIAGNOSIS — M545 Low back pain: Secondary | ICD-10-CM | POA: Diagnosis not present

## 2018-02-26 DIAGNOSIS — I10 Essential (primary) hypertension: Secondary | ICD-10-CM | POA: Diagnosis not present

## 2018-02-26 DIAGNOSIS — Z6837 Body mass index (BMI) 37.0-37.9, adult: Secondary | ICD-10-CM | POA: Diagnosis not present

## 2018-02-26 DIAGNOSIS — M5416 Radiculopathy, lumbar region: Secondary | ICD-10-CM | POA: Diagnosis not present

## 2018-02-27 DIAGNOSIS — R296 Repeated falls: Secondary | ICD-10-CM | POA: Diagnosis not present

## 2018-02-27 DIAGNOSIS — R262 Difficulty in walking, not elsewhere classified: Secondary | ICD-10-CM | POA: Diagnosis not present

## 2018-02-27 DIAGNOSIS — R531 Weakness: Secondary | ICD-10-CM | POA: Diagnosis not present

## 2018-02-27 DIAGNOSIS — M79605 Pain in left leg: Secondary | ICD-10-CM | POA: Diagnosis not present

## 2018-02-27 DIAGNOSIS — M545 Low back pain: Secondary | ICD-10-CM | POA: Diagnosis not present

## 2018-04-07 DIAGNOSIS — H2013 Chronic iridocyclitis, bilateral: Secondary | ICD-10-CM | POA: Diagnosis not present

## 2018-05-04 DIAGNOSIS — H2013 Chronic iridocyclitis, bilateral: Secondary | ICD-10-CM | POA: Diagnosis not present

## 2018-05-14 IMAGING — US US THYROID
1 series · 13 of 25 positions shown · non-contrast
Comparison: 04/25/2015; 08/02/2013

CLINICAL DATA: Prior ultrasound follow-up. History of right thyroid
lobectomy (with reported benign pathology). Follow-up left-sided
thyroid nodule.

EXAM:
THYROID ULTRASOUND
TECHNIQUE: Ultrasound examination of the thyroid gland and adjacent soft
tissues was performed.

[Series 1: us thyroid · 0.05mm/px · 13 of 34 slices shown]
[im 1/34]
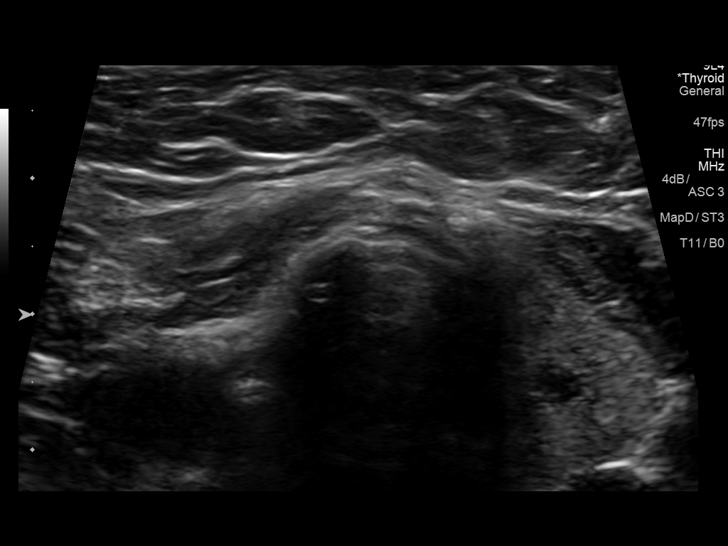
[im 3/34]
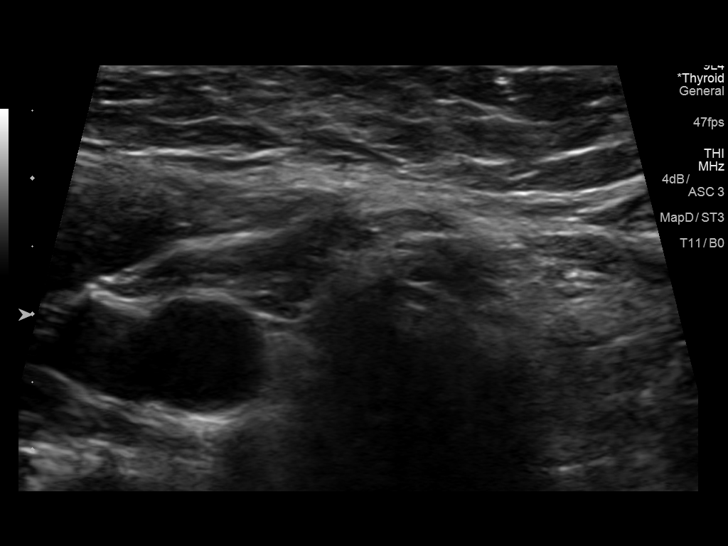
[im 6/34]
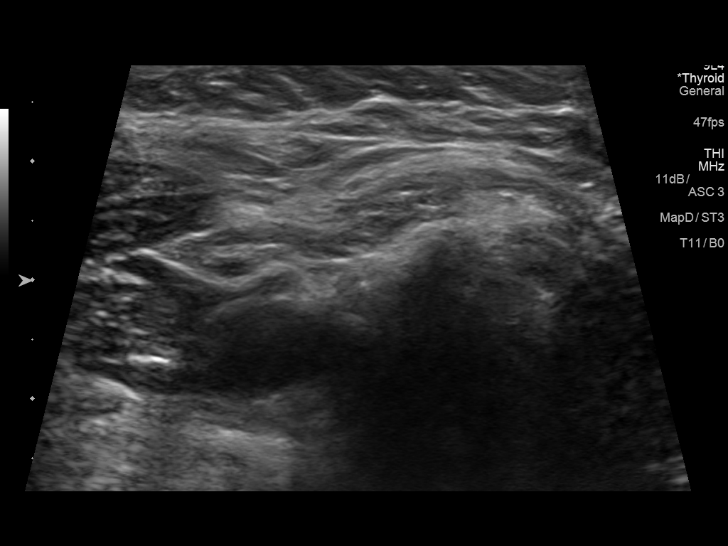
[im 9/34]
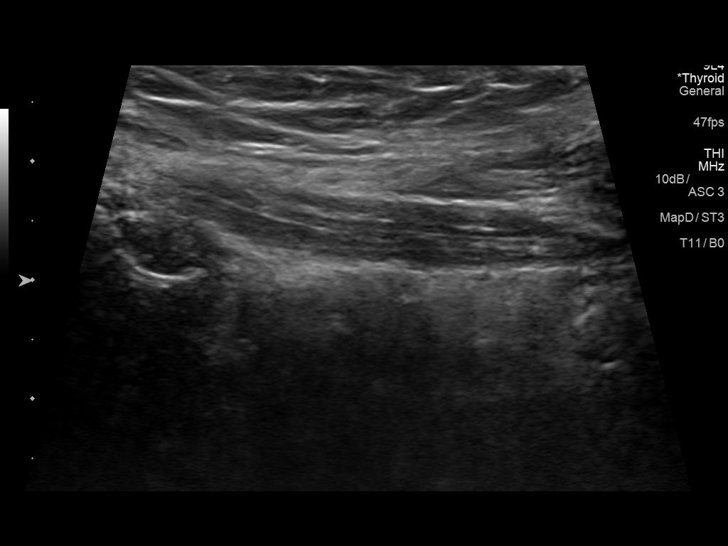
[im 12/34]
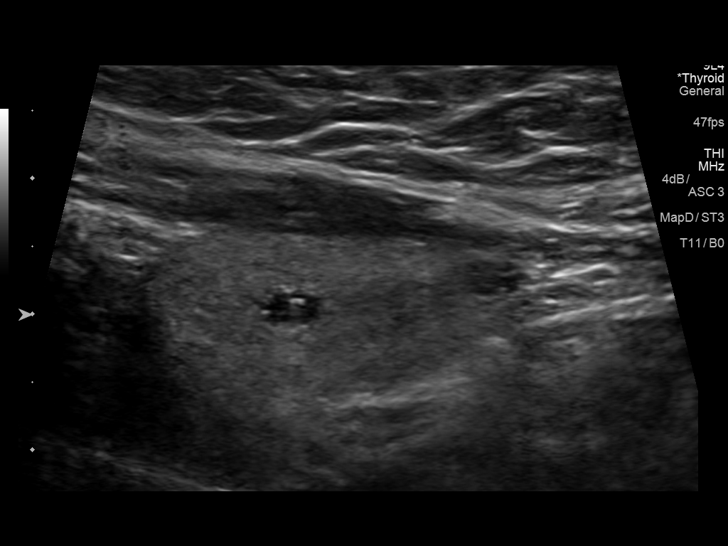
[im 14/34]
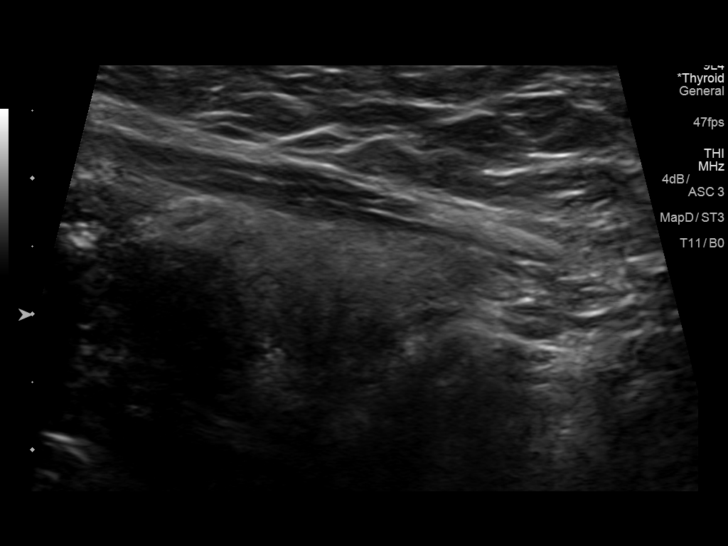
[im 17/34]
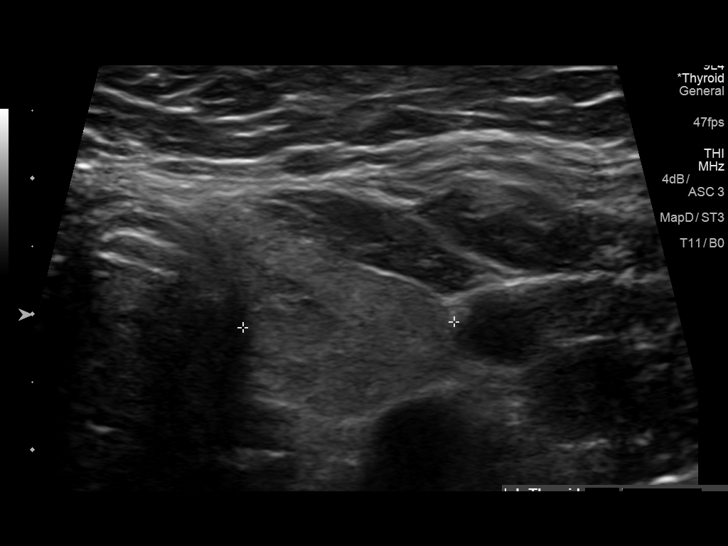
[im 20/34]
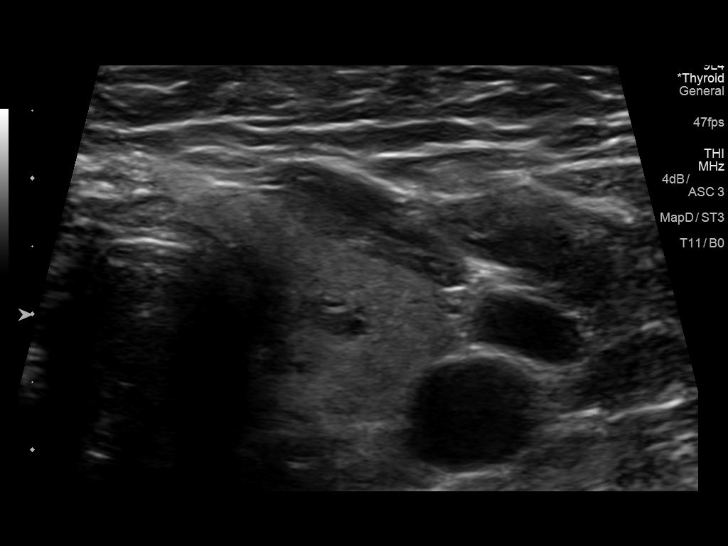
[im 23/34]
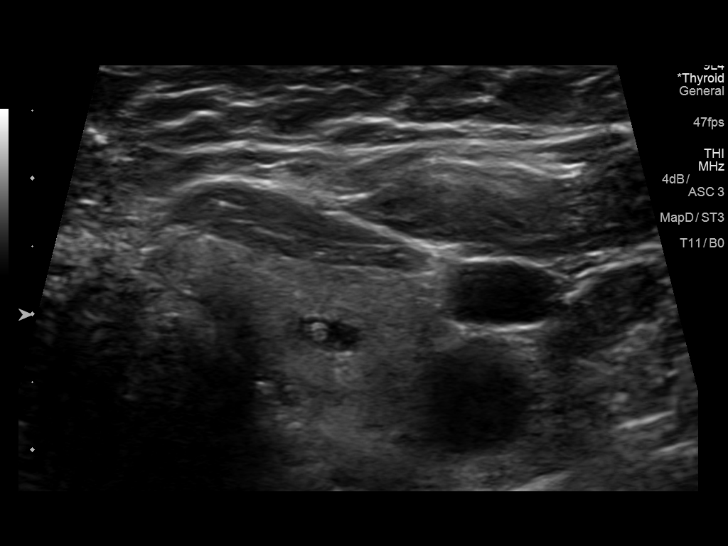
[im 25/34]
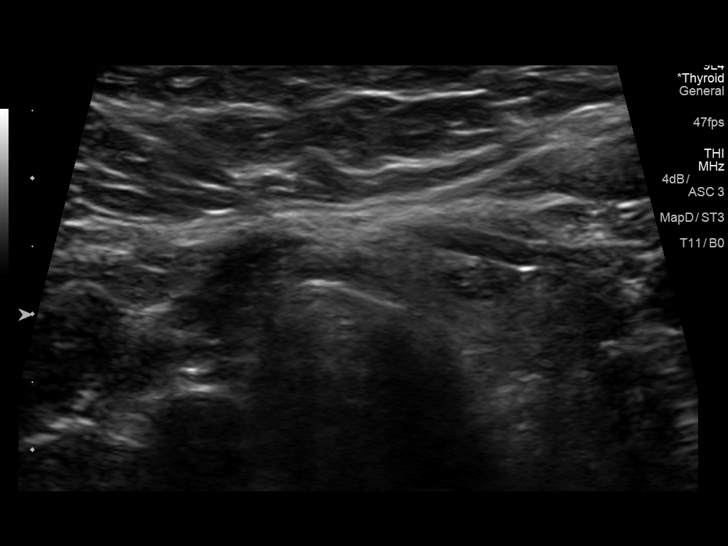
[im 28/34]
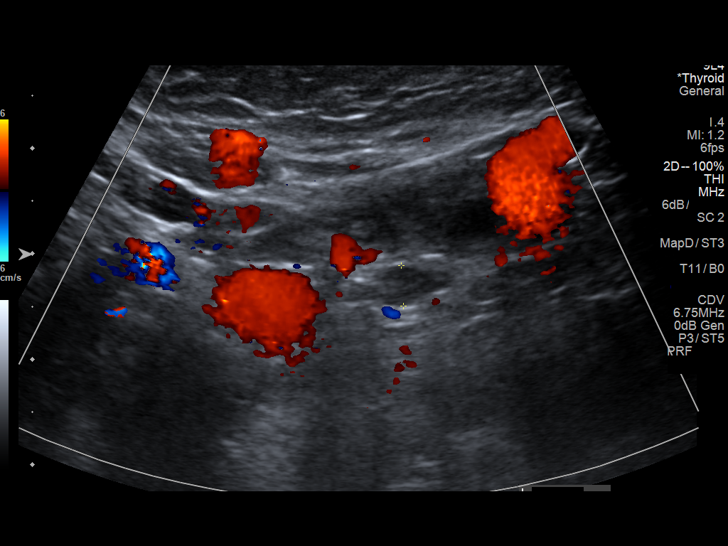
[im 31/34]
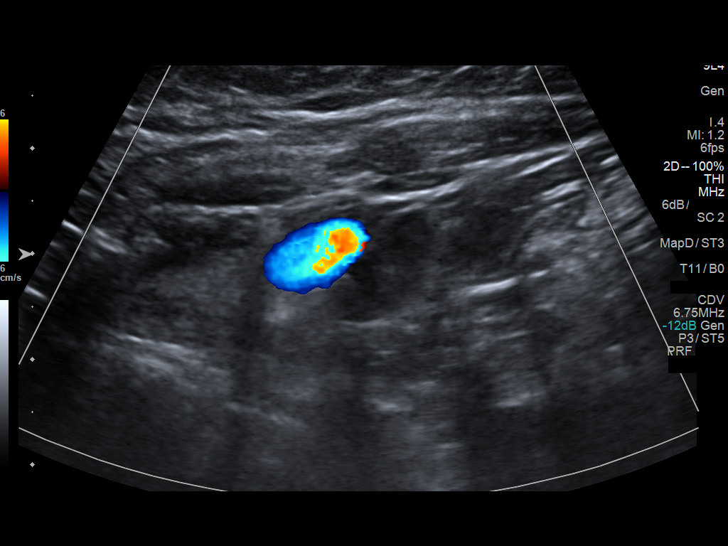
[im 34/34]
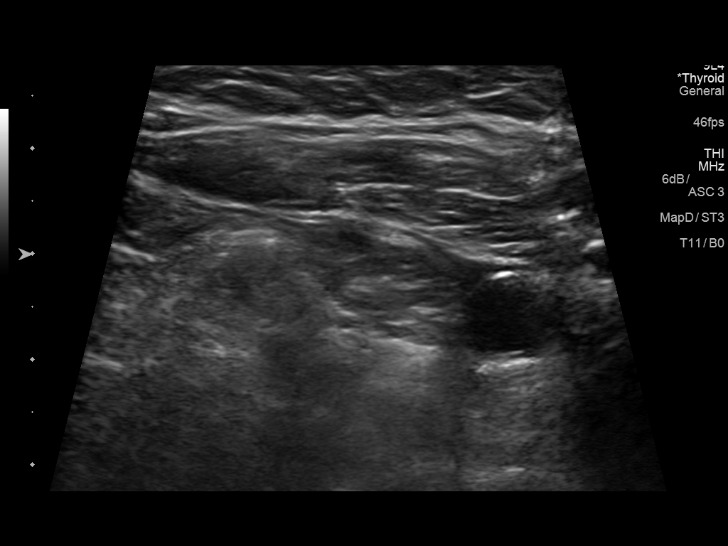

[13 of 25 positions shown; findings below may reference images not displayed]

FINDINGS: Parenchymal Echotexture: Mildly heterogenous

Isthmus: Surgically absent. There is no residual soft tissue within
isthmic resection bed.

Right lobe: Surgically absent. There is no residual soft tissue
within the right lobectomy resection bed.

Left lobe: Diminutive in size measuring 2.9 x 1.3 x 1.6 cm,
unchanged, previously, 3.1 x 0.9 x 1.6 cm.

_________________________________________________________

Estimated total number of nodules >/= 1 cm: 1

Number of spongiform nodules >/=  2 cm not described below (TR1): 0

Number of mixed cystic and solid nodules >/= 1.5 cm not described
below (TR2): 0

_________________________________________________________

There is a punctate (approximately 0.4 cm) anechoic cyst within the
left lobe of the thyroid which contains an internal punctate
echogenic foci with ring down artifact compatible with colloid,
similar to the [DATE] examination. This nodule does not meet
imaging criteria to recommend percutaneous sampling or dedicated
follow-up.

There is an additional punctate (approximately 0.4 cm) hypoechoic
nodule within the inferior pole the left lobe of the thyroid which
is also unchanged compared to the [DATE] examination and is not
meet imaging criteria to recommend percutaneous sampling or
dedicated follow-up.

Several non pathologically enlarged left-sided cervical lymph nodes
incidentally noted.
IMPRESSION: 1. Post isthmic resection and right lobectomy without nodular tissue
within the resection bed to suggest locally recurrent or residual
disease.
2. Punctate (sub 4 mm) left-sided thyroid nodules are similar to the
[DATE] examination and do not meet imaging criteria to recommend
percutaneous sampling or continued dedicated follow-up.
The above is in keeping with the ACR TI-RADS recommendations - [HOSPITAL] 6881;[DATE].

## 2018-06-26 DIAGNOSIS — D126 Benign neoplasm of colon, unspecified: Secondary | ICD-10-CM | POA: Diagnosis not present

## 2018-06-26 DIAGNOSIS — Z86010 Personal history of colonic polyps: Secondary | ICD-10-CM | POA: Diagnosis not present

## 2018-06-26 DIAGNOSIS — K5730 Diverticulosis of large intestine without perforation or abscess without bleeding: Secondary | ICD-10-CM | POA: Diagnosis not present

## 2018-06-30 DIAGNOSIS — D126 Benign neoplasm of colon, unspecified: Secondary | ICD-10-CM | POA: Diagnosis not present

## 2018-07-06 DIAGNOSIS — R7301 Impaired fasting glucose: Secondary | ICD-10-CM | POA: Diagnosis not present

## 2018-07-06 DIAGNOSIS — K219 Gastro-esophageal reflux disease without esophagitis: Secondary | ICD-10-CM | POA: Diagnosis not present

## 2018-07-06 DIAGNOSIS — N183 Chronic kidney disease, stage 3 (moderate): Secondary | ICD-10-CM | POA: Diagnosis not present

## 2018-07-06 DIAGNOSIS — R062 Wheezing: Secondary | ICD-10-CM | POA: Diagnosis not present

## 2018-07-06 DIAGNOSIS — I10 Essential (primary) hypertension: Secondary | ICD-10-CM | POA: Diagnosis not present

## 2018-07-06 DIAGNOSIS — E039 Hypothyroidism, unspecified: Secondary | ICD-10-CM | POA: Diagnosis not present

## 2018-07-13 DIAGNOSIS — H2013 Chronic iridocyclitis, bilateral: Secondary | ICD-10-CM | POA: Diagnosis not present

## 2018-07-24 DIAGNOSIS — Z79899 Other long term (current) drug therapy: Secondary | ICD-10-CM | POA: Diagnosis not present

## 2018-07-26 DIAGNOSIS — I2609 Other pulmonary embolism with acute cor pulmonale: Secondary | ICD-10-CM

## 2018-07-26 HISTORY — PX: TRANSTHORACIC ECHOCARDIOGRAM: SHX275

## 2018-07-26 HISTORY — PX: OTHER SURGICAL HISTORY: SHX169

## 2018-07-26 HISTORY — DX: Other pulmonary embolism with acute cor pulmonale: I26.09

## 2018-08-18 ENCOUNTER — Encounter (HOSPITAL_COMMUNITY): Payer: Self-pay | Admitting: Emergency Medicine

## 2018-08-18 ENCOUNTER — Other Ambulatory Visit: Payer: Self-pay

## 2018-08-18 ENCOUNTER — Emergency Department (HOSPITAL_COMMUNITY): Payer: Medicare HMO

## 2018-08-18 ENCOUNTER — Inpatient Hospital Stay (HOSPITAL_COMMUNITY)
Admission: EM | Admit: 2018-08-18 | Discharge: 2018-08-23 | DRG: 176 | Disposition: A | Discharge status: Home / Self Care | Payer: Medicare HMO | Source: Ambulatory Visit | Attending: Cardiology | Admitting: Cardiology

## 2018-08-18 DIAGNOSIS — E039 Hypothyroidism, unspecified: Secondary | ICD-10-CM | POA: Diagnosis present

## 2018-08-18 DIAGNOSIS — Z8249 Family history of ischemic heart disease and other diseases of the circulatory system: Secondary | ICD-10-CM

## 2018-08-18 DIAGNOSIS — I071 Rheumatic tricuspid insufficiency: Secondary | ICD-10-CM | POA: Diagnosis present

## 2018-08-18 DIAGNOSIS — I2699 Other pulmonary embolism without acute cor pulmonale: Principal | ICD-10-CM | POA: Diagnosis present

## 2018-08-18 DIAGNOSIS — R7989 Other specified abnormal findings of blood chemistry: Secondary | ICD-10-CM | POA: Diagnosis present

## 2018-08-18 DIAGNOSIS — E785 Hyperlipidemia, unspecified: Secondary | ICD-10-CM

## 2018-08-18 DIAGNOSIS — Z96652 Presence of left artificial knee joint: Secondary | ICD-10-CM | POA: Diagnosis present

## 2018-08-18 DIAGNOSIS — I272 Pulmonary hypertension, unspecified: Secondary | ICD-10-CM | POA: Diagnosis not present

## 2018-08-18 DIAGNOSIS — R0789 Other chest pain: Secondary | ICD-10-CM | POA: Diagnosis not present

## 2018-08-18 DIAGNOSIS — R0609 Other forms of dyspnea: Secondary | ICD-10-CM

## 2018-08-18 DIAGNOSIS — Z6839 Body mass index (BMI) 39.0-39.9, adult: Secondary | ICD-10-CM | POA: Diagnosis not present

## 2018-08-18 DIAGNOSIS — R110 Nausea: Secondary | ICD-10-CM | POA: Diagnosis not present

## 2018-08-18 DIAGNOSIS — Z807 Family history of other malignant neoplasms of lymphoid, hematopoietic and related tissues: Secondary | ICD-10-CM | POA: Diagnosis not present

## 2018-08-18 DIAGNOSIS — I351 Nonrheumatic aortic (valve) insufficiency: Secondary | ICD-10-CM | POA: Diagnosis not present

## 2018-08-18 DIAGNOSIS — Z86711 Personal history of pulmonary embolism: Secondary | ICD-10-CM | POA: Diagnosis present

## 2018-08-18 DIAGNOSIS — E119 Type 2 diabetes mellitus without complications: Secondary | ICD-10-CM | POA: Diagnosis present

## 2018-08-18 DIAGNOSIS — I214 Non-ST elevation (NSTEMI) myocardial infarction: Secondary | ICD-10-CM | POA: Diagnosis not present

## 2018-08-18 DIAGNOSIS — Z87891 Personal history of nicotine dependence: Secondary | ICD-10-CM

## 2018-08-18 DIAGNOSIS — R079 Chest pain, unspecified: Secondary | ICD-10-CM | POA: Diagnosis not present

## 2018-08-18 DIAGNOSIS — Z23 Encounter for immunization: Secondary | ICD-10-CM

## 2018-08-18 DIAGNOSIS — Z96642 Presence of left artificial hip joint: Secondary | ICD-10-CM | POA: Diagnosis present

## 2018-08-18 DIAGNOSIS — I2510 Atherosclerotic heart disease of native coronary artery without angina pectoris: Secondary | ICD-10-CM | POA: Diagnosis present

## 2018-08-18 DIAGNOSIS — K219 Gastro-esophageal reflux disease without esophagitis: Secondary | ICD-10-CM | POA: Diagnosis not present

## 2018-08-18 DIAGNOSIS — I2609 Other pulmonary embolism with acute cor pulmonale: Secondary | ICD-10-CM | POA: Diagnosis not present

## 2018-08-18 DIAGNOSIS — R778 Other specified abnormalities of plasma proteins: Secondary | ICD-10-CM | POA: Diagnosis present

## 2018-08-18 DIAGNOSIS — R0602 Shortness of breath: Secondary | ICD-10-CM | POA: Diagnosis not present

## 2018-08-18 DIAGNOSIS — I2729 Other secondary pulmonary hypertension: Secondary | ICD-10-CM | POA: Diagnosis not present

## 2018-08-18 DIAGNOSIS — I82412 Acute embolism and thrombosis of left femoral vein: Secondary | ICD-10-CM | POA: Diagnosis not present

## 2018-08-18 DIAGNOSIS — I10 Essential (primary) hypertension: Secondary | ICD-10-CM | POA: Diagnosis present

## 2018-08-18 DIAGNOSIS — Z833 Family history of diabetes mellitus: Secondary | ICD-10-CM

## 2018-08-18 DIAGNOSIS — R0902 Hypoxemia: Secondary | ICD-10-CM | POA: Diagnosis not present

## 2018-08-18 DIAGNOSIS — R748 Abnormal levels of other serum enzymes: Secondary | ICD-10-CM | POA: Diagnosis not present

## 2018-08-18 LAB — HEPATIC FUNCTION PANEL
ALT: 13 U/L (ref 0–44)
AST: 22 U/L (ref 15–41)
Albumin: 4.1 g/dL (ref 3.5–5.0)
Alkaline Phosphatase: 46 U/L (ref 38–126)
BILIRUBIN INDIRECT: 0.3 mg/dL (ref 0.3–0.9)
Bilirubin, Direct: 0.2 mg/dL (ref 0.0–0.2)
TOTAL PROTEIN: 7.5 g/dL (ref 6.5–8.1)
Total Bilirubin: 0.5 mg/dL (ref 0.3–1.2)

## 2018-08-18 LAB — BASIC METABOLIC PANEL
Anion gap: 13 (ref 5–15)
BUN: 22 mg/dL (ref 8–23)
CALCIUM: 10 mg/dL (ref 8.9–10.3)
CHLORIDE: 106 mmol/L (ref 98–111)
CO2: 23 mmol/L (ref 22–32)
CREATININE: 1.32 mg/dL — AB (ref 0.44–1.00)
GFR calc non Af Amer: 39 mL/min — ABNORMAL LOW (ref >60)
GFR, EST AFRICAN AMERICAN: 45 mL/min — AB (ref >60)
GLUCOSE: 115 mg/dL — AB (ref 70–99)
Potassium: 3.8 mmol/L (ref 3.5–5.1)
Sodium: 142 mmol/L (ref 135–145)

## 2018-08-18 LAB — HEMOGLOBIN A1C
Hgb A1c MFr Bld: 6.2 % — ABNORMAL HIGH (ref 4.8–5.6)
Mean Plasma Glucose: 131.24 mg/dL

## 2018-08-18 LAB — I-STAT TROPONIN, ED
TROPONIN I, POC: 0.27 ng/mL — AB (ref 0.00–0.08)
Troponin i, poc: 0.18 ng/mL (ref 0.00–0.08)

## 2018-08-18 LAB — CBC
HCT: 39 % (ref 36.0–46.0)
Hemoglobin: 12.4 g/dL (ref 12.0–15.0)
MCH: 29.9 pg (ref 26.0–34.0)
MCHC: 31.8 g/dL (ref 30.0–36.0)
MCV: 94 fL (ref 78.0–100.0)
PLATELETS: 351 10*3/uL (ref 150–400)
RBC: 4.15 MIL/uL (ref 3.87–5.11)
RDW: 13.4 % (ref 11.5–15.5)
WBC: 10.6 10*3/uL — ABNORMAL HIGH (ref 4.0–10.5)

## 2018-08-18 LAB — TSH: TSH: 1.932 u[IU]/mL (ref 0.350–4.500)

## 2018-08-18 LAB — HEPARIN LEVEL (UNFRACTIONATED): Heparin Unfractionated: 0.74 IU/mL — ABNORMAL HIGH (ref 0.30–0.70)

## 2018-08-18 LAB — PROTIME-INR
INR: 1.14
Prothrombin Time: 14.5 seconds (ref 11.4–15.2)

## 2018-08-18 LAB — GLUCOSE, CAPILLARY: GLUCOSE-CAPILLARY: 135 mg/dL — AB (ref 70–99)

## 2018-08-18 LAB — TROPONIN I: TROPONIN I: 0.14 ng/mL — AB (ref <0.03)

## 2018-08-18 MED ORDER — SODIUM CHLORIDE 0.9% FLUSH
3.0000 mL | Freq: Two times a day (BID) | INTRAVENOUS | Status: DC
  Administered 2018-08-19: 3 mL via INTRAVENOUS

## 2018-08-18 MED ORDER — ASPIRIN 300 MG RE SUPP: 300.0000 mg | RECTAL | Status: AC

## 2018-08-18 MED ORDER — NITROGLYCERIN 0.4 MG SL SUBL: 0.4000 mg | SUBLINGUAL_TABLET | SUBLINGUAL | Status: DC | PRN

## 2018-08-18 MED ORDER — INSULIN ASPART 100 UNIT/ML ~~LOC~~ SOLN: 0.0000 [IU] | Freq: Every day | SUBCUTANEOUS | Status: DC

## 2018-08-18 MED ORDER — BISACODYL 5 MG PO TBEC
10.0000 mg | DELAYED_RELEASE_TABLET | Freq: Every day | ORAL | Status: DC | PRN
  Administered 2018-08-20: 10 mg via ORAL
  Administered 2018-08-21: 5 mg via ORAL
  Filled 2018-08-18 (×2): qty 2

## 2018-08-18 MED ORDER — ACETAMINOPHEN 325 MG PO TABS
650.0000 mg | ORAL_TABLET | ORAL | Status: DC | PRN
  Administered 2018-08-19: 650 mg via ORAL
  Filled 2018-08-18: qty 2

## 2018-08-18 MED ORDER — CARVEDILOL 6.25 MG PO TABS
6.2500 mg | ORAL_TABLET | Freq: Two times a day (BID) | ORAL | Status: DC
  Administered 2018-08-18 – 2018-08-23 (×9): 6.25 mg via ORAL
  Filled 2018-08-18 (×9): qty 1

## 2018-08-18 MED ORDER — HEPARIN (PORCINE) IN NACL 100-0.45 UNIT/ML-% IJ SOLN
850.0000 [IU]/h | INTRAMUSCULAR | Status: DC
  Filled 2018-08-18: qty 250

## 2018-08-18 MED ORDER — HEPARIN (PORCINE) IN NACL 100-0.45 UNIT/ML-% IJ SOLN
950.0000 [IU]/h | INTRAMUSCULAR | Status: DC
  Administered 2018-08-18: 850 [IU]/h via INTRAVENOUS
  Filled 2018-08-18: qty 250

## 2018-08-18 MED ORDER — SODIUM CHLORIDE 0.9% FLUSH: 3.0000 mL | INTRAVENOUS | Status: DC | PRN

## 2018-08-18 MED ORDER — DIFLUPREDNATE 0.05 % OP EMUL
1.0000 [drp] | Freq: Every day | OPHTHALMIC | Status: DC
  Administered 2018-08-18 – 2018-08-23 (×6): 1 [drp] via OPHTHALMIC

## 2018-08-18 MED ORDER — ASPIRIN 81 MG PO CHEW
81.0000 mg | CHEWABLE_TABLET | ORAL | Status: AC
  Administered 2018-08-19: 81 mg via ORAL
  Filled 2018-08-18: qty 1

## 2018-08-18 MED ORDER — VITAMIN D 1000 UNITS PO TABS
2000.0000 [IU] | ORAL_TABLET | Freq: Every day | ORAL | Status: DC
  Administered 2018-08-19 – 2018-08-23 (×5): 2000 [IU] via ORAL
  Filled 2018-08-18 (×6): qty 2

## 2018-08-18 MED ORDER — PANTOPRAZOLE SODIUM 40 MG PO TBEC
40.0000 mg | DELAYED_RELEASE_TABLET | Freq: Every day | ORAL | Status: DC
  Administered 2018-08-19 – 2018-08-23 (×5): 40 mg via ORAL
  Filled 2018-08-18 (×5): qty 1

## 2018-08-18 MED ORDER — ALBUTEROL SULFATE (2.5 MG/3ML) 0.083% IN NEBU: 3.0000 mL | INHALATION_SOLUTION | Freq: Four times a day (QID) | RESPIRATORY_TRACT | Status: DC | PRN

## 2018-08-18 MED ORDER — INSULIN ASPART 100 UNIT/ML ~~LOC~~ SOLN: 0.0000 [IU] | Freq: Three times a day (TID) | SUBCUTANEOUS | Status: DC

## 2018-08-18 MED ORDER — DILTIAZEM HCL ER COATED BEADS 360 MG PO CP24
360.0000 mg | ORAL_CAPSULE | Freq: Every day | ORAL | Status: DC
  Administered 2018-08-19 – 2018-08-23 (×5): 360 mg via ORAL
  Filled 2018-08-18 (×2): qty 1
  Filled 2018-08-18: qty 2
  Filled 2018-08-18: qty 1
  Filled 2018-08-18: qty 2
  Filled 2018-08-18: qty 1
  Filled 2018-08-18 (×2): qty 2
  Filled 2018-08-18: qty 1

## 2018-08-18 MED ORDER — LEVOTHYROXINE SODIUM 50 MCG PO TABS
50.0000 ug | ORAL_TABLET | Freq: Every day | ORAL | Status: DC
  Administered 2018-08-19 – 2018-08-23 (×5): 50 ug via ORAL
  Filled 2018-08-18 (×5): qty 1

## 2018-08-18 MED ORDER — FENOFIBRATE 54 MG PO TABS
54.0000 mg | ORAL_TABLET | Freq: Every day | ORAL | Status: DC
  Administered 2018-08-19 – 2018-08-23 (×5): 54 mg via ORAL
  Filled 2018-08-18 (×5): qty 1

## 2018-08-18 MED ORDER — SODIUM CHLORIDE 0.9 % IV SOLN
INTRAVENOUS | Status: DC
  Administered 2018-08-19: 05:00:00 via INTRAVENOUS

## 2018-08-18 MED ORDER — SODIUM CHLORIDE 0.9 % IV SOLN: 250.0000 mL | INTRAVENOUS | Status: DC | PRN

## 2018-08-18 MED ORDER — NITROGLYCERIN 0.4 MG SL SUBL
0.4000 mg | SUBLINGUAL_TABLET | SUBLINGUAL | Status: DC | PRN
  Administered 2018-08-18: 0.4 mg via SUBLINGUAL
  Filled 2018-08-18: qty 1

## 2018-08-18 MED ORDER — ZOLPIDEM TARTRATE 5 MG PO TABS
5.0000 mg | ORAL_TABLET | Freq: Every evening | ORAL | Status: DC | PRN
  Administered 2018-08-18: 5 mg via ORAL
  Filled 2018-08-18: qty 1

## 2018-08-18 MED ORDER — GI COCKTAIL ~~LOC~~
30.0000 mL | Freq: Once | ORAL | Status: AC
  Administered 2018-08-18: 30 mL via ORAL
  Filled 2018-08-18: qty 30

## 2018-08-18 MED ORDER — NITROGLYCERIN 2 % TD OINT
1.0000 [in_us] | TOPICAL_OINTMENT | Freq: Four times a day (QID) | TRANSDERMAL | Status: DC
  Administered 2018-08-18 – 2018-08-22 (×10): 1 [in_us] via TOPICAL
  Filled 2018-08-18: qty 30

## 2018-08-18 MED ORDER — ONDANSETRON HCL 4 MG/2ML IJ SOLN: 4.0000 mg | Freq: Four times a day (QID) | INTRAMUSCULAR | Status: DC | PRN

## 2018-08-18 MED ORDER — HEPARIN BOLUS VIA INFUSION
4000.0000 [IU] | Freq: Once | INTRAVENOUS | Status: AC
  Administered 2018-08-18: 4000 [IU] via INTRAVENOUS
  Filled 2018-08-18: qty 4000

## 2018-08-18 MED ORDER — KETOROLAC TROMETHAMINE 0.5 % OP SOLN
1.0000 [drp] | Freq: Three times a day (TID) | OPHTHALMIC | Status: DC
  Administered 2018-08-18 – 2018-08-23 (×14): 1 [drp] via OPHTHALMIC
  Filled 2018-08-18: qty 5

## 2018-08-18 MED ORDER — ASPIRIN 81 MG PO CHEW
324.0000 mg | CHEWABLE_TABLET | ORAL | Status: AC
  Administered 2018-08-18: 324 mg via ORAL
  Filled 2018-08-18: qty 4

## 2018-08-18 NOTE — ED Notes (Signed)
IV team at bedside 

## 2018-08-18 NOTE — ED Notes (Signed)
Pt's SpO2 92% on RA.  Patient states hx of smoking, no COPD diagnosis, but "it's never above 92, even at my doctor's office"

## 2018-08-18 NOTE — ED Provider Notes (Signed)
[facility] EMERGENCY DEPARTMENT Provider Note   CSN: [id number] Arrival date & time: 08/18/18  1430     History   Chief Complaint Chief Complaint  Patient presents with  . Chest Pain    HPI Tricia Harrison is a 72 y.o. female.  Pt presents to the ED today with cp.  The pt said she developed sob last night with chest heaviness.  She felt like she had to burp.  She took some Alka-seltzer last night which helped, but sx came back.  Pt denies cp, but feels like there is something sitting on her chest.  Pt also felt like her heart was racing this morning.  Pt does have a hx of palpitations (no documented a.fib), but is on xarelto.  The pt is followed by Dr. Marlou Porch.  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  Last stress test 1-2 years ago was "ok."      Past Medical History:  Diagnosis Date  . Anemia    as a child  . Asthma    as a child  . Chronic back pain    spinal stenosis and buldging disc;scoliosis  . Chronic constipation    occasionally takes something OTC  . Diabetes mellitus without complication ([place])    per pt "Pre" for 20+yrs  . Diverticulosis   . Diverticulosis   . Dysphagia   . Gallstones    has known about this for 3-19yrs  . GERD (gastroesophageal reflux disease)    takes Omeprazole daily  . H/O hiatal hernia   . Hemorrhoids   . History of blood transfusion    no abnormal reaction noted  . History of bronchitis    states its been a long time ago  . History of gout   . HLD (hyperlipidemia)    takes Fenofibrate daily  . HTN (hypertension)    takes Diltiazem and Ramipril daily  . Hx of gallstones   . Hypothyroidism    takes Synthroid daily  . Joint pain   . Joint swelling   . Left knee DJD   . Nocturia   . NSTEMI (non-ST elevated myocardial infarction) ([place]) 08/18/2018  . Palpitations    started in 2013 and only occasionally;last time noticed about 2-3wks ago and after drinking caffeine  . PONV  (postoperative nausea and vomiting)     Patient Active Problem List   Diagnosis Date Noted  . NSTEMI (non-ST elevated myocardial infarction) ([place]) 08/18/2018  . 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  . Bilateral ovarian cysts   . Hx of gallstones   . Hernia, hiatal   . Renal cysts, acquired, bilateral   . GERD (gastroesophageal reflux disease)   . Diverticulosis   . Lumbar spinal stenosis   . Diabetes mellitus without complication ([place])   . Left knee DJD   . Palpitations 12/06/2013  . Morbid obesity ([place]) 12/06/2013  . Fatigue 12/06/2013  . PVC (premature ventricular contraction) 12/06/2013  . HTN (hypertension)   . Hypothyroidism   . HLD (hyperlipidemia)     Past Surgical History:  Procedure Laterality Date  . COLONOSCOPY    . DILATION AND CURETTAGE OF UTERUS     multiple about 6-7 times  . ESOPHAGOGASTRODUODENOSCOPY    . THYROID SURGERY Right 2010  . TOTAL HIP ARTHROPLASTY Left 2005  . TOTAL KNEE ARTHROPLASTY Left 02/21/2014   Procedure: TOTAL KNEE ARTHROPLASTY;  Surgeon: Lorn Junes, MD;  Location: [facility];  Service:  Orthopedics;  Laterality: Left;  Marland Kitchen VAGINAL HYSTERECTOMY  1979     OB History   None      Home Medications    Prior to Admission medications   Medication Sig Start Date End Date Taking? Authorizing Provider  acetaminophen (TYLENOL) 500 MG tablet Take 1,000 mg by mouth every 6 (six) hours as needed for mild pain.   Yes [provider]  albuterol (PROVENTIL HFA;VENTOLIN HFA) 108 (90 Base) MCG/ACT inhaler Inhale 2 puffs into the lungs every 6 (six) hours as needed. 07/06/18  Yes [provider]  bisacodyl (DULCOLAX) 5 MG EC tablet Take 2 tablets every night with dinner until bowel movement.  LAXITIVE.  Restart if two days since last bowel movement 02/22/14  Yes Shepperson, Kirstin, PA-C  Cholecalciferol (VITAMIN D3) 2000 UNITS TABS Take 2,000 mg by mouth daily.   Yes [provider]  diltiazem (CARDIZEM LA) 360 MG  24 hr tablet Take 360 mg by mouth daily.   Yes [provider]  DUREZOL 0.05 % EMUL Place 1 drop into both eyes daily. 07/26/18  Yes [provider]  fenofibrate (TRICOR) 145 MG tablet Take 145 mg by mouth daily.   Yes [provider]  ketorolac (ACULAR) 0.5 % ophthalmic solution Place 1 drop into both eyes 3 (three) times daily. 07/26/18  Yes [provider]  levothyroxine (SYNTHROID, LEVOTHROID) 50 MCG tablet Take 50 mcg by mouth daily before breakfast.   Yes [provider]  magnesium hydroxide (MILK OF MAGNESIA) 800 MG/5ML suspension Take 15 mLs by mouth daily as needed for constipation.   Yes [provider]  omeprazole (PRILOSEC) 20 MG capsule Take 20 mg by mouth daily.   Yes [provider]  ramipril (ALTACE) 10 MG capsule Take 10 mg by mouth daily.   Yes [provider]  docusate sodium 100 MG CAPS 1 tab 2 times a day while on narcotics.  STOOL SOFTENER Patient not taking: Reported on 08/18/2018 02/22/14   Matthew Saras, PA-C  HYDROmorphone (DILAUDID) 2 MG tablet 1-2 tablets every 4-6 hrs as needed for pain Patient not taking: Reported on 08/18/2018 02/22/14   Matthew Saras, PA-C  ondansetron (ZOFRAN) 4 MG tablet 1-2 tablets every 6-8 hrs prn nausea Patient not taking: Reported on 08/18/2018 02/22/14   Shepperson, Kirstin, PA-C  rivaroxaban (XARELTO) 10 MG TABS tablet Take 1 tablet (10 mg total) by mouth daily with breakfast. Patient not taking: Reported on 08/18/2018 02/22/14   Matthew Saras, PA-C    Family History Family History  Problem Relation Age of Onset  . Uterine cancer Mother   . Heart disease Mother   . Hypertension Mother   . Diabetes Mother   . Cirrhosis Father   . Pneumonia Father   . Lymphoma Sister        ,non hodgkin   . Diabetes Sister     Social History Social History   Tobacco Use  . Smoking status: Former Smoker    Last attempt to quit: 11/25/1993    Years since quitting:  24.7  . Smokeless tobacco: Never Used  . Tobacco comment: quit smoking in 1995  Substance Use Topics  . Alcohol use: Yes    Comment: once a week  . Drug use: No     Allergies   Atorvastatin; Codeine; and Morphine and related   Review of Systems Review of Systems  Respiratory: Positive for shortness of breath.   Cardiovascular:       Chest heaviness  All other  systems reviewed and are negative.    Physical Exam Updated Vital Signs BP (!) 149/76   Pulse 70   Resp 16   Ht 5\' 1"  (1.549 m)   Wt 93.9 kg   SpO2 96%   BMI 39.11 kg/m   Physical Exam  Constitutional: She is oriented to person, place, and time. She appears well-developed and well-nourished.  HENT:  Head: Normocephalic and atraumatic.  Eyes: Pupils are equal, round, and reactive to light. EOM are normal.  Neck: Normal range of motion. Neck supple.  Cardiovascular: Normal rate, regular rhythm, intact distal pulses and normal pulses.  Pulmonary/Chest: Effort normal and breath sounds normal.  Abdominal: Soft. Bowel sounds are normal.  Musculoskeletal: Normal range of motion.       Right lower leg: Normal.       Left lower leg: Normal.  Neurological: She is alert and oriented to person, place, and time.  Skin: Skin is warm and dry. Capillary refill takes less than 2 seconds.  Psychiatric: She has a normal mood and affect. Her behavior is normal.  Nursing note and vitals reviewed.    ED Treatments / Results  Labs (all labs ordered are listed, but only abnormal results are displayed) Labs Reviewed  BASIC METABOLIC PANEL - Abnormal; Notable for the following components:      Result Value   Glucose, Bld 115 (*)    Creatinine, Ser 1.32 (*)    GFR calc non Af Amer 39 (*)    GFR calc Af Amer 45 (*)    All other components within normal limits  CBC - Abnormal; Notable for the following components:   WBC 10.6 (*)    All other components within normal limits  HEPARIN LEVEL (UNFRACTIONATED) - Abnormal; Notable  for the following components:   Heparin Unfractionated 0.74 (*)    All other components within normal limits  I-STAT TROPONIN, ED - Abnormal; Notable for the following components:   Troponin i, poc 0.18 (*)    All other components within normal limits  I-STAT TROPONIN, ED - Abnormal; Notable for the following components:   Troponin i, poc 0.27 (*)    All other components within normal limits  HEPATIC FUNCTION PANEL  HEPARIN LEVEL (UNFRACTIONATED)  CBC    EKG EKG Interpretation  Date/Time:  Tuesday August 18 2018 14:40:34 EDT Ventricular Rate:  86 PR Interval:    QRS Duration: 111 QT Interval:  344 QTC Calculation: 412 R Axis:   -61 Text Interpretation:  Sinus rhythm Prolonged PR interval Left anterior fascicular block Abnormal R-wave progression, late transition Probable left ventricular hypertrophy Nonspecific T abnormalities, lateral leads No old tracing to compare Confirmed by Isla Pence [phone]) on 08/18/2018 2:56:05 PM   Radiology Dg Chest 2 View  Result Date: 08/18/2018 CLINICAL DATA:  Chronic shortness of breath for several years but increased severity yesterday associated with chest tightness. Former smoker. History of hypertension and pre diabetes. EXAM: CHEST - 2 VIEW COMPARISON:  PA and lateral chest x-ray of February 07, 2014 FINDINGS: The lungs are adequately inflated. There is no focal infiltrate. Within the mediastinum there is appears to be an air-fluid level likely in the esophagus. The heart and pulmonary vascularity are normal. The mediastinum is normal in width. There is faint calcification in the wall of the aortic arch. There is multilevel degenerative disc disease of the thoracic spine. IMPRESSION: No acute cardiopulmonary abnormality. Thoracic aortic atherosclerosis. Air in fluid level which appears to lie in the mid esophagus. This may reflect  achalasia or reflux. Electronically Signed   By: David  Martinique M.D.   On: 08/18/2018 15:39     Procedures Procedures (including critical care time)  Medications Ordered in ED Medications  nitroGLYCERIN (NITROSTAT) SL tablet 0.4 mg (0.4 mg Sublingual Not Given 08/18/18 1552)  heparin ADULT infusion 100 units/mL (25000 units/234mL sodium chloride 0.45%) (850 Units/hr Intravenous New Bag/Given 08/18/18 1637)  albuterol (PROVENTIL HFA;VENTOLIN HFA) 108 (90 Base) MCG/ACT inhaler 2 puff (has no administration in time range)  bisacodyl (DULCOLAX) EC tablet 10 mg (has no administration in time range)  Vitamin D3 TABS 2,000 mg (has no administration in time range)  diltiazem (CARDIZEM LA) 24 hr tablet 360 mg (has no administration in time range)  Difluprednate 0.05 % EMUL 1 drop (has no administration in time range)  fenofibrate tablet 54 mg (has no administration in time range)  ketorolac (ACULAR) 0.5 % ophthalmic solution 1 drop (has no administration in time range)  levothyroxine (SYNTHROID, LEVOTHROID) tablet 50 mcg (has no administration in time range)  pantoprazole (PROTONIX) EC tablet 40 mg (has no administration in time range)  nitroGLYCERIN (NITROGLYN) 2 % ointment 1 inch (has no administration in time range)  gi cocktail (Maalox,Lidocaine,Donnatal) (30 mLs Oral Given 08/18/18 1552)  heparin bolus via infusion 4,000 Units (4,000 Units Intravenous Bolus from Bag 08/18/18 1638)     Initial Impression / Assessment and Plan / ED Course  I have reviewed the triage vital signs and the nursing notes.  Pertinent labs & imaging results that were available during my care of the patient were reviewed by me and considered in my medical decision making (see chart for details).    CRITICAL CARE Performed by: Isla Pence   Total critical care time: 30 minutes  Critical care time was exclusive of separately billable procedures and treating other patients.  Critical care was necessary to treat or prevent imminent or life-threatening deterioration.  Critical care was time spent  personally by me on the following activities: development of treatment plan with patient and/or surrogate as well as nursing, discussions with consultants, evaluation of patient's response to treatment, examination of patient, obtaining history from patient or surrogate, ordering and performing treatments and interventions, ordering and review of laboratory studies, ordering and review of radiographic studies, pulse oximetry and re-evaluation of patient's condition.   Pt with elevation in troponin and good story.  Nitro did help pain.  EKG unremarkable.  The pt was put on heparin and will be admitted to cardiology.  Cardiology plans on cath for tomorrow.  Final Clinical Impressions(s) / ED Diagnoses   Final diagnoses:  NSTEMI (non-ST elevated myocardial infarction) [facility])    ED Discharge Orders    None       Isla Pence, MD 08/18/18 [phone]

## 2018-08-18 NOTE — ED Notes (Signed)
Dinner tray ordered for patient.  Admitting at bedside

## 2018-08-18 NOTE — Progress Notes (Addendum)
ANTICOAGULATION CONSULT NOTE - Initial Consult  Pharmacy Consult for heparin Indication: chest pain/ACS  Allergies  Allergen Reactions  . Codeine Nausea And Vomiting  . Morphine And Related     B/p drops     Patient Measurements:   Heparin Dosing Weight: 70  Vital Signs: BP: 129/76 (09/24 1542) Pulse Rate: 83 (09/24 1542)  Labs: Recent Labs    08/18/18 1452  HGB 12.4  HCT 39.0  PLT 351    CrCl cannot be calculated (Patient's most recent lab result is older than the maximum 21 days allowed.).   Medical History: Past Medical History:  Diagnosis Date  . Anemia    as a child  . Asthma    as a child  . Chronic back pain    spinal stenosis and buldging disc;scoliosis  . Chronic constipation    occasionally takes something OTC  . Diabetes mellitus without complication ([place])    per pt "Pre" for 20+yrs  . Diverticulosis   . Diverticulosis   . Dysphagia   . Gallstones    has known about this for 3-52yrs  . GERD (gastroesophageal reflux disease)    takes Omeprazole daily  . H/O hiatal hernia   . Hemorrhoids   . History of blood transfusion    no abnormal reaction noted  . History of bronchitis    states its been a long time ago  . History of gout   . HLD (hyperlipidemia)    takes Fenofibrate daily  . HTN (hypertension)    takes Diltiazem and Ramipril daily  . Hx of gallstones   . Hypothyroidism    takes Synthroid daily  . Joint pain   . Joint swelling   . Left knee DJD   . Nocturia   . Palpitations    started in 2013 and only occasionally;last time noticed about 2-3wks ago and after drinking caffeine  . PONV (postoperative nausea and vomiting)     Medications:    Assessment: 72 yo F who presented with chest pain. Heparin started for ACS/NSTEMI. Pt had Xarelto on PTA med list but denies ever taking Xarelto or any other anticoagulant. CBC WNL.   Goal of Therapy:  Heparin level 0.3-0.7 units/ml Monitor platelets by anticoagulation protocol: Yes    Plan:  Heparin 4000 unit bolus, then IV gtt 850 units/hr  6 hr Heparin level Daily heparin level, CBC, monitor for s/s of bleeding  Harrietta Guardian, PharmD PGY1 Pharmacy Resident 08/18/2018    3:51 PM

## 2018-08-18 NOTE — H&P (Addendum)
Cardiology History and Physical:   Patient ID: Tricia Harrison; [id number]; 1946-11-14   Admit date: 08/18/2018 Date of Consult: 08/18/2018  Primary Care Provider: Lujean Amel, MD Primary Cardiologist: Candee Furbish, MD [phone] Primary Electrophysiologist:  none   Patient Profile:   Tricia Harrison is a 72 y.o. female with a hx of palpitations, DM (A1c 6.1), HTN, HLD, GERD, hypothyroid, DJD, who is being seen today for the evaluation of chest pain, SOB, elevated troponin, at the request of Dr Gilford Raid.  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  Tricia Harrison has chronic DOE. Her ambulation is also limited by Tricia issues.   Yesterday, she walked from the bedroom to the door and was very SOB, she had to rest after this. She got chest pressure with this. The pressure resolved w/ rest.   At 11:30 last pm, she woke w/ chest heaviness and nausea. She took an antacid and sx resolved and she was able to sleep.  Today, she was wheezing and felt SOB. She used her inhaler, wheezing resolved but she felt jittery.  She had not eaten recently, so ate something, although she was not really hungry. She again developed chest heaviness and laid down, but her heart started pounding. It was rapid but regular. She took a Tums. Sx improved some but persisted.  She went to Urgent Care and was noted to be SOB w/ minimal exertion. Her oxygen levels were low. She was sent to the ER.   In the ER, she got nitro and a GI cocktail, feels the GI cocktail helped more.   She has felt tired, weak and light-headed with ambulation, in addition to other sx.   She has had the DOE for a long time, but the sx are more pronounced now.    Past Medical History:  Diagnosis Date  . Anemia    as a child  . Asthma    as  a child  . Chronic back pain    spinal stenosis and buldging disc;scoliosis  . Chronic constipation    occasionally takes something OTC  . Diabetes mellitus without complication ([place])    per pt "Pre" for 20+yrs  . Diverticulosis   . Diverticulosis   . Dysphagia   . Gallstones    has known about this for 3-16yrs  . GERD (gastroesophageal reflux disease)    takes Omeprazole daily  . H/O hiatal hernia   . Hemorrhoids   . History of blood transfusion    no abnormal reaction noted  . History of bronchitis    states its been a long time ago  . History of gout   . HLD (hyperlipidemia)    takes Fenofibrate daily  . HTN (hypertension)    takes Diltiazem and Ramipril daily  . Hx of gallstones   . Hypothyroidism    takes Synthroid daily  . Joint pain   . Joint swelling   . Left knee DJD   . Nocturia   . NSTEMI (non-ST elevated myocardial infarction) ([place]) 08/18/2018  . Palpitations    started in 2013 and only occasionally;last time noticed about 2-3wks ago and after drinking caffeine  . PONV (postoperative nausea and vomiting)     Past Surgical History:  Procedure Laterality Date  . COLONOSCOPY    . DILATION  AND CURETTAGE OF UTERUS     multiple about 6-7 times  . ESOPHAGOGASTRODUODENOSCOPY    . THYROID SURGERY Right 2010  . TOTAL HIP ARTHROPLASTY Left 2005  . TOTAL KNEE ARTHROPLASTY Left 02/21/2014   Procedure: TOTAL KNEE ARTHROPLASTY;  Surgeon: Lorn Junes, MD;  Location: [facility];  Service: Orthopedics;  Laterality: Left;  Marland Kitchen VAGINAL HYSTERECTOMY  1979     Prior to Admission medications   Medication Sig Start Date End Date Taking? Authorizing Provider  acetaminophen (TYLENOL) 500 MG tablet Take 1,000 mg by mouth every 6 (six) hours as needed for mild pain.   Yes [provider]  albuterol (PROVENTIL HFA;VENTOLIN HFA) 108 (90 Base) MCG/ACT inhaler Inhale 2 puffs into the lungs every 6 (six) hours as needed. 07/06/18  Yes [provider]  bisacodyl (DULCOLAX)  5 MG EC tablet Take 2 tablets every night with dinner until bowel movement.  LAXITIVE.  Restart if two days since last bowel movement 02/22/14  Yes Shepperson, Kirstin, PA-C  Cholecalciferol (VITAMIN D3) 2000 UNITS TABS Take 2,000 mg by mouth daily.   Yes [provider]  diltiazem (CARDIZEM LA) 360 MG 24 hr tablet Take 360 mg by mouth daily.   Yes [provider]  DUREZOL 0.05 % EMUL Place 1 drop into both eyes daily. 07/26/18  Yes [provider]  fenofibrate (TRICOR) 145 MG tablet Take 145 mg by mouth daily.   Yes [provider]  ketorolac (ACULAR) 0.5 % ophthalmic solution Place 1 drop into both eyes 3 (three) times daily. 07/26/18  Yes [provider]  levothyroxine (SYNTHROID, LEVOTHROID) 50 MCG tablet Take 50 mcg by mouth daily before breakfast.   Yes [provider]  magnesium hydroxide (MILK OF MAGNESIA) 800 MG/5ML suspension Take 15 mLs by mouth daily as needed for constipation.   Yes [provider]  omeprazole (PRILOSEC) 20 MG capsule Take 20 mg by mouth daily.   Yes [provider]  ramipril (ALTACE) 10 MG capsule Take 10 mg by mouth daily.   Yes [provider]  docusate sodium 100 MG CAPS 1 tab 2 times a day while on narcotics.  STOOL SOFTENER Patient not taking: Reported on 08/18/2018 02/22/14   Matthew Saras, PA-C  HYDROmorphone (DILAUDID) 2 MG tablet 1-2 tablets every 4-6 hrs as needed for pain Patient not taking: Reported on 08/18/2018 02/22/14   Matthew Saras, PA-C  ondansetron (ZOFRAN) 4 MG tablet 1-2 tablets every 6-8 hrs prn nausea Patient not taking: Reported on 08/18/2018 02/22/14   Shepperson, Kirstin, PA-C  rivaroxaban (XARELTO) 10 MG TABS tablet Take 1 tablet (10 mg total) by mouth daily with breakfast. Patient not taking: Reported on 08/18/2018 02/22/14   Matthew Saras, PA-C    Inpatient Medications: Scheduled Meds: . Difluprednate  1 drop Both Eyes Daily  . diltiazem  360 mg  Oral Daily  . fenofibrate  54 mg Oral Daily  . ketorolac  1 drop Both Eyes TID  . [START ON 08/19/2018] levothyroxine  50 mcg Oral QAC breakfast  . nitroGLYCERIN  1 inch Topical Q6H  . pantoprazole  40 mg Oral Daily  . Vitamin D3  2,000 mg Oral Daily   Continuous Infusions: . heparin 850 Units/hr (08/18/18 1637)   PRN Meds: albuterol, bisacodyl, nitroGLYCERIN  Allergies:    Allergies  Allergen Reactions  . Atorvastatin Other (See Comments)    Muscle pain  . Codeine Nausea And Vomiting  . Morphine And Related     B/p drops  Social History:   Social History   Socioeconomic History  . Marital status: Single    Spouse name: Not on file  . Number of children: Not on file  . Years of education: Not on file  . Highest education level: Not on file  Occupational History  . Occupation: Retired  Scientific laboratory technician  . Financial resource strain: Not on file  . Food insecurity:    Worry: Not on file    Inability: Not on file  . Transportation needs:    Medical: Not on file    Non-medical: Not on file  Tobacco Use  . Smoking status: Former Smoker    Last attempt to quit: 11/25/1993    Years since quitting: 24.7  . Smokeless tobacco: Never Used  . Tobacco comment: quit smoking in 1995  Substance and Sexual Activity  . Alcohol use: Yes    Comment: once a week  . Drug use: No  . Sexual activity: Not on file  Lifestyle  . Physical activity:    Days per week: Not on file    Minutes per session: Not on file  . Stress: Not on file  Relationships  . Social connections:    Talks on phone: Not on file    Gets together: Not on file    Attends religious service: Not on file    Active member of club or organization: Not on file    Attends meetings of clubs or organizations: Not on file    Relationship status: Not on file  . Intimate partner violence:    Fear of current or ex partner: Not on file    Emotionally abused: Not on file    Physically abused: Not on file    Forced sexual  activity: Not on file  Other Topics Concern  . Not on file  Social History Narrative   Pt lives w/ sister and nephew.    Family History:   Family History  Problem Relation Age of Onset  . Uterine cancer Mother   . Heart disease Mother   . Hypertension Mother   . Diabetes Mother   . Cirrhosis Father   . Pneumonia Father   . Lymphoma Sister        ,non hodgkin   . Diabetes Sister    Family Status:  Family Status  Relation Name Status  . Mother  Deceased  . Father  Deceased  . Sister  Alive    ROS:  Please see the history of present illness.  All other ROS reviewed and negative.     Physical Exam/Data:   Vitals:   08/18/18 1630 08/18/18 1645 08/18/18 1830 08/18/18 1845  BP: (!) 142/81 140/79 (!) 147/73 (!) 149/76  Pulse: 75 75 72 70  Resp: 19 18 17 16   SpO2: 96% 97% 92% 96%  Weight:      Height:       No intake or output data in the 24 hours ending 08/18/18 1908 Filed Weights   08/18/18 1550  Weight: 93.9 kg   Body mass index is 39.11 kg/m.  General:  Well nourished, well developed, in no acute distress HEENT: normal Lymph: no adenopathy Neck: no JVD Endocrine:  No thryomegaly Vascular: No carotid bruits; 4/4 extremity pulses 2+, without bruits  Cardiac:  normal S1, S2; RRR; 2/6 murmur  Lungs:  clear to auscultation bilaterally, no wheezing, rhonchi or rales  Abd: soft, nontender, no hepatomegaly  Ext: no edema Musculoskeletal:  No deformities, BUE and BLE strength normal  and equal Skin: warm and dry  Neuro:  CNs 2-12 intact, no focal abnormalities noted Psych:  Normal affect   EKG:  The EKG was personally reviewed and demonstrates:  SR, HR 86, borderline 1st degree block. Late R wave transition.  Telemetry:  Telemetry was personally reviewed and demonstrates:  SR  Relevant CV Studies:  ECHO: 2015 in [place] EF 60% R atrium and RV mildly dilated w/ preserved RV function.  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  Recent Labs  Lab 08/18/18 1501 08/18/18 1834  TROPIPOC 0.18* 0.27*    BNPNo results for input(s): BNP, PROBNP in the last 168 hours.  DDimer No results for input(s): DDIMER in the last 168 hours. TSH: No results found for: TSH Lipids:No results found for: CHOL, HDL, LDLCALC, LDLDIRECT, TRIG, CHOLHDL HgbA1c:No results found for: HGBA1C Magnesium: No results found for: MG   Radiology/Studies:  Dg Chest 2 View  Result Date: 08/18/2018 CLINICAL DATA:  Chronic shortness of breath for several years but increased severity yesterday associated with chest tightness. Former smoker. History of hypertension and pre diabetes. EXAM: CHEST - 2 VIEW COMPARISON:  PA and lateral chest x-ray of February 07, 2014 FINDINGS: The lungs are adequately inflated. There is no focal infiltrate. Within the mediastinum there is appears to be an air-fluid level likely in the esophagus. The heart and pulmonary vascularity are normal. The mediastinum is normal in width. There is faint calcification in the wall of the aortic arch. There is multilevel degenerative disc disease of the thoracic spine. IMPRESSION: No acute cardiopulmonary abnormality. Thoracic aortic atherosclerosis. Air in fluid level which appears to lie in the mid esophagus. This may reflect achalasia or reflux. Electronically Signed   By: Delanie Tirrell  [place] M.D.   On: 08/18/2018 15:39    Assessment and Plan:    Principal  Problem: 1.  NSTEMI (non-ST elevated myocardial infarction) ([place]) -Admit, continue heparin, make sure she has had 324 mg of aspirin, add Nitropaste. -Cath tomorrow The risks and benefits of a cardiac catheterization including, but not limited to, death, stroke, MI, kidney damage and bleeding were discussed with the patient who indicates understanding and agrees to proceed.   Active Problems: 2.  HTN (hypertension) -Blood pressure is elevated -Continue home meds but without ACE inhibitor to protect renal function - Add beta-blocker  3.  Hypothyroidism -Continue home dose of Synthroid, check TSH  4.  HLD (hyperlipidemia) - Goal LDL less than 70, check profile in a.m. and continue statin  5.GERD (gastroesophageal reflux disease)  -Continue home Rx with as needed GI cocktail  6.  Diabetes mellitus without complication ([place]) - Diabetic diet and sliding scale insulin    Signed, Rosaria Ferries, PA-C  08/18/2018 7:08 PM  I have seen, examined and evaluated the  patient this PM along with Rosaria Ferries, PA-C.  After reviewing all the available data and chart, we discussed the patients laboratory, study & physical findings as well as symptoms in detail. I agree with her findings, examination as well as impression recommendations as per our discussion.    I actually saw the patient in the emergency room along with Rosaria Ferries, the physical exam was performed by me.  Tricia. [place] has had long-standing exertional dyspnea but now has had acute exacerbation of his exertional dyspnea to the point where she cannot walk across the room in her house.  She has had at least 3 episodes of first exertional and then to resting chest heaviness and tightness that has been prolonged.  She has had mild troponin elevation is concerning for ACS/non-STEMI.  Second troponin level has actually gone up to 27.  Currently chest pain-free  Plan is to admit overnight on IV nitroglycerin.  With plan cath tomorrow when  available. Add beta-blocker hold off on ACE inhibitor until post-cath.  She has long-standing dyspnea and suspected in the future she may benefit from PFTs.  We will check a 2D echo to evaluate EF and pressures.  She does have a soft murmur on exam.      Glenetta Hew, M.D., M.S. Interventional Cardiologist   Pager # [phone] Phone # [phone] [address] [place], [place] [address]   For questions or updates, please contact [facility] Please consult [email or web address] for contact info under Cardiology/STEMI.

## 2018-08-18 NOTE — ED Notes (Signed)
Pt transported to xray 

## 2018-08-18 NOTE — ED Notes (Signed)
Dr. Gilford Raid made aware of elevated troponin

## 2018-08-18 NOTE — ED Triage Notes (Signed)
[facility]:  SOB for "a while" now.  Last night began having chest heaviness, and worsening SOB.  Alka-selzter last night which helped some.  Heaviness in chest began again this morning while cooking breakfast with nausea and SOB.  Went to [facility] (PCP) and they sent patient here.  Patient SOB walking from chair to RR at PCP.  8/10 CP.  324 ASA given en route, unable to get IV, did not give Nitro.

## 2018-08-18 NOTE — ED Notes (Signed)
Patient able to ambulate independently to restroom.

## 2018-08-18 NOTE — Plan of Care (Signed)

## 2018-08-18 NOTE — ED Notes (Signed)
Pt ambulated self efficiently to restroom with no difficulty. Pt O2 sat @ 91 % RA @ bedside. Pt O2 sat decreased to 88% while ambulating. Pt placed on 2L NS when returning safely to bedside.

## 2018-08-19 ENCOUNTER — Encounter (HOSPITAL_COMMUNITY)
Admission: EM | Disposition: A | Discharge status: Home / Self Care | Payer: Self-pay | Source: Ambulatory Visit | Attending: Cardiology

## 2018-08-19 ENCOUNTER — Observation Stay (HOSPITAL_COMMUNITY): Payer: Medicare HMO

## 2018-08-19 DIAGNOSIS — I2510 Atherosclerotic heart disease of native coronary artery without angina pectoris: Secondary | ICD-10-CM

## 2018-08-19 DIAGNOSIS — I2729 Other secondary pulmonary hypertension: Secondary | ICD-10-CM

## 2018-08-19 DIAGNOSIS — I351 Nonrheumatic aortic (valve) insufficiency: Secondary | ICD-10-CM

## 2018-08-19 DIAGNOSIS — R0609 Other forms of dyspnea: Secondary | ICD-10-CM

## 2018-08-19 HISTORY — PX: RIGHT/LEFT HEART CATH AND CORONARY ANGIOGRAPHY: CATH118266

## 2018-08-19 LAB — HEPARIN LEVEL (UNFRACTIONATED)
Heparin Unfractionated: 0.28 IU/mL — ABNORMAL LOW (ref 0.30–0.70)
Heparin Unfractionated: 0.57 IU/mL (ref 0.30–0.70)

## 2018-08-19 LAB — LIPID PANEL
CHOL/HDL RATIO: 3.1 ratio
Cholesterol: 159 mg/dL (ref 0–200)
HDL: 52 mg/dL (ref >40)
LDL Cholesterol: 90 mg/dL (ref 0–99)
Triglycerides: 86 mg/dL (ref <150)
VLDL: 17 mg/dL (ref 0–40)

## 2018-08-19 LAB — POCT I-STAT 3, VENOUS BLOOD GAS (G3P V)
ACID-BASE DEFICIT: 4 mmol/L — AB (ref 0.0–2.0)
BICARBONATE: 20.8 mmol/L (ref 20.0–28.0)
O2 SAT: 73 %
PCO2 VEN: 34.7 mmHg — AB (ref 44.0–60.0)
TCO2: 22 mmol/L (ref 22–32)
pH, Ven: 7.387 (ref 7.250–7.430)
pO2, Ven: 38 mmHg (ref 32.0–45.0)

## 2018-08-19 LAB — ECHOCARDIOGRAM COMPLETE
HEIGHTINCHES: 61 in
WEIGHTICAEL: 3318.4 [oz_av]

## 2018-08-19 LAB — COMPREHENSIVE METABOLIC PANEL
ALBUMIN: 3.7 g/dL (ref 3.5–5.0)
ALT: 11 U/L (ref 0–44)
ANION GAP: 13 (ref 5–15)
AST: 17 U/L (ref 15–41)
Alkaline Phosphatase: 38 U/L (ref 38–126)
BUN: 25 mg/dL — ABNORMAL HIGH (ref 8–23)
CO2: 21 mmol/L — ABNORMAL LOW (ref 22–32)
Calcium: 9.2 mg/dL (ref 8.9–10.3)
Chloride: 107 mmol/L (ref 98–111)
Creatinine, Ser: 1.46 mg/dL — ABNORMAL HIGH (ref 0.44–1.00)
GFR calc Af Amer: 40 mL/min — ABNORMAL LOW (ref >60)
GFR calc non Af Amer: 35 mL/min — ABNORMAL LOW (ref >60)
GLUCOSE: 105 mg/dL — AB (ref 70–99)
POTASSIUM: 3.9 mmol/L (ref 3.5–5.1)
Sodium: 141 mmol/L (ref 135–145)
Total Bilirubin: 0.7 mg/dL (ref 0.3–1.2)
Total Protein: 6.8 g/dL (ref 6.5–8.1)

## 2018-08-19 LAB — CBC
HCT: 36.1 % (ref 36.0–46.0)
HEMOGLOBIN: 11.7 g/dL — AB (ref 12.0–15.0)
MCH: 30.1 pg (ref 26.0–34.0)
MCHC: 32.4 g/dL (ref 30.0–36.0)
MCV: 92.8 fL (ref 78.0–100.0)
Platelets: 360 10*3/uL (ref 150–400)
RBC: 3.89 MIL/uL (ref 3.87–5.11)
RDW: 13.6 % (ref 11.5–15.5)
WBC: 8.5 10*3/uL (ref 4.0–10.5)

## 2018-08-19 LAB — POCT I-STAT 3, ART BLOOD GAS (G3+)
Acid-base deficit: 5 mmol/L — ABNORMAL HIGH (ref 0.0–2.0)
BICARBONATE: 19.4 mmol/L — AB (ref 20.0–28.0)
O2 Saturation: 96 %
PO2 ART: 86 mmHg (ref 83.0–108.0)
TCO2: 20 mmol/L — AB (ref 22–32)
pCO2 arterial: 32.9 mmHg (ref 32.0–48.0)
pH, Arterial: 7.379 (ref 7.350–7.450)

## 2018-08-19 LAB — GLUCOSE, CAPILLARY
GLUCOSE-CAPILLARY: 103 mg/dL — AB (ref 70–99)
GLUCOSE-CAPILLARY: 92 mg/dL (ref 70–99)
Glucose-Capillary: 80 mg/dL (ref 70–99)
Glucose-Capillary: 132 mg/dL — ABNORMAL HIGH (ref 70–99)

## 2018-08-19 LAB — TROPONIN I
TROPONIN I: 0.17 ng/mL — AB (ref <0.03)
Troponin I: 0.23 ng/mL (ref <0.03)

## 2018-08-19 LAB — POCT ACTIVATED CLOTTING TIME: Activated Clotting Time: 136 seconds

## 2018-08-19 SURGERY — RIGHT/LEFT HEART CATH AND CORONARY ANGIOGRAPHY
Anesthesia: LOCAL

## 2018-08-19 MED ORDER — HEPARIN (PORCINE) IN NACL 1000-0.9 UT/500ML-% IV SOLN
INTRAVENOUS | Status: AC
  Filled 2018-08-19: qty 1000

## 2018-08-19 MED ORDER — FENTANYL CITRATE (PF) 100 MCG/2ML IJ SOLN
INTRAMUSCULAR | Status: DC | PRN
  Administered 2018-08-19: 25 ug via INTRAVENOUS

## 2018-08-19 MED ORDER — LIDOCAINE HCL (PF) 1 % IJ SOLN
INTRAMUSCULAR | Status: AC
  Filled 2018-08-19: qty 30

## 2018-08-19 MED ORDER — SODIUM CHLORIDE 0.9 % IV SOLN: INTRAVENOUS | Status: AC

## 2018-08-19 MED ORDER — ROSUVASTATIN CALCIUM 20 MG PO TABS
20.0000 mg | ORAL_TABLET | Freq: Every day | ORAL | Status: DC
  Administered 2018-08-20 – 2018-08-22 (×3): 20 mg via ORAL
  Filled 2018-08-19 (×3): qty 1

## 2018-08-19 MED ORDER — HYDRALAZINE HCL 20 MG/ML IJ SOLN
INTRAMUSCULAR | Status: AC
  Filled 2018-08-19: qty 1

## 2018-08-19 MED ORDER — HEPARIN SODIUM (PORCINE) 5000 UNIT/ML IJ SOLN
5000.0000 [IU] | Freq: Three times a day (TID) | INTRAMUSCULAR | Status: DC
  Administered 2018-08-20: 5000 [IU] via SUBCUTANEOUS
  Filled 2018-08-19: qty 1

## 2018-08-19 MED ORDER — HEPARIN (PORCINE) IN NACL 1000-0.9 UT/500ML-% IV SOLN
INTRAVENOUS | Status: DC | PRN
  Administered 2018-08-19 (×2): 500 mL

## 2018-08-19 MED ORDER — ACETAMINOPHEN 325 MG PO TABS
650.0000 mg | ORAL_TABLET | ORAL | Status: DC | PRN
  Administered 2018-08-20 – 2018-08-22 (×5): 650 mg via ORAL
  Filled 2018-08-19 (×5): qty 2

## 2018-08-19 MED ORDER — ASPIRIN EC 81 MG PO TBEC
81.0000 mg | DELAYED_RELEASE_TABLET | Freq: Every day | ORAL | Status: DC
  Administered 2018-08-20 – 2018-08-22 (×3): 81 mg via ORAL
  Filled 2018-08-19 (×3): qty 1

## 2018-08-19 MED ORDER — LIDOCAINE HCL (PF) 1 % IJ SOLN
INTRAMUSCULAR | Status: DC | PRN
  Administered 2018-08-19: 2 mL via INTRADERMAL
  Administered 2018-08-19: 30 mL via INTRADERMAL

## 2018-08-19 MED ORDER — FENTANYL CITRATE (PF) 100 MCG/2ML IJ SOLN
INTRAMUSCULAR | Status: AC
  Filled 2018-08-19: qty 2

## 2018-08-19 MED ORDER — INFLUENZA VAC SPLIT HIGH-DOSE 0.5 ML IM SUSY
0.5000 mL | PREFILLED_SYRINGE | INTRAMUSCULAR | Status: AC
  Administered 2018-08-20: 0.5 mL via INTRAMUSCULAR
  Filled 2018-08-19: qty 0.5

## 2018-08-19 MED ORDER — SODIUM CHLORIDE 0.9% FLUSH
3.0000 mL | Freq: Two times a day (BID) | INTRAVENOUS | Status: DC
  Administered 2018-08-19 – 2018-08-22 (×6): 3 mL via INTRAVENOUS

## 2018-08-19 MED ORDER — HYDRALAZINE HCL 20 MG/ML IJ SOLN
10.0000 mg | Freq: Once | INTRAMUSCULAR | Status: AC
  Administered 2018-08-19: 10 mg via INTRAVENOUS

## 2018-08-19 MED ORDER — MIDAZOLAM HCL 2 MG/2ML IJ SOLN
INTRAMUSCULAR | Status: DC | PRN
  Administered 2018-08-19: 1 mg via INTRAVENOUS

## 2018-08-19 MED ORDER — SODIUM CHLORIDE 0.9% FLUSH: 3.0000 mL | INTRAVENOUS | Status: DC | PRN

## 2018-08-19 MED ORDER — SODIUM CHLORIDE 0.9 % IV SOLN: 250.0000 mL | INTRAVENOUS | Status: DC | PRN

## 2018-08-19 MED ORDER — ONDANSETRON HCL 4 MG/2ML IJ SOLN: 4.0000 mg | Freq: Four times a day (QID) | INTRAMUSCULAR | Status: DC | PRN

## 2018-08-19 MED ORDER — MIDAZOLAM HCL 2 MG/2ML IJ SOLN
INTRAMUSCULAR | Status: AC
  Filled 2018-08-19: qty 2

## 2018-08-19 MED ORDER — IOHEXOL 350 MG/ML SOLN
INTRAVENOUS | Status: DC | PRN
  Administered 2018-08-19: 15 mL via INTRA_ARTERIAL

## 2018-08-19 SURGICAL SUPPLY — 16 items
CATH BALLN WEDGE 5F 110CM (CATHETERS) ×2 IMPLANT
CATH INFINITI 5FR MULTPACK ANG (CATHETERS) ×2 IMPLANT
CATH SWAN GANZ 7F STRAIGHT (CATHETERS) IMPLANT
HOVERMATT SINGLE USE (MISCELLANEOUS) ×2 IMPLANT
KIT HEART LEFT (KITS) ×2 IMPLANT
PACK CARDIAC CATHETERIZATION (CUSTOM PROCEDURE TRAY) ×2 IMPLANT
SHEATH GLIDE SLENDER 4/5FR (SHEATH) ×2 IMPLANT
SHEATH PINNACLE 5F 10CM (SHEATH) ×2 IMPLANT
SHEATH PINNACLE 7F 10CM (SHEATH) IMPLANT
SHEATH PROBE COVER 6X72 (BAG) ×2 IMPLANT
TRANSDUCER W/STOPCOCK (MISCELLANEOUS) ×2 IMPLANT
TUBING ART PRESS 72  MALE/FEM (TUBING) ×1
TUBING ART PRESS 72 MALE/FEM (TUBING) ×1 IMPLANT
TUBING CIL FLEX 10 FLL-RA (TUBING) ×2 IMPLANT
WIRE EMERALD 3MM-J .025X260CM (WIRE) ×2 IMPLANT
WIRE EMERALD 3MM-J .035X150CM (WIRE) ×2 IMPLANT

## 2018-08-19 NOTE — Progress Notes (Signed)
Site area: Right groin 5 french arterial sheath was removed by Julaine Hua RN  Site Prior to Removal:  Level 0  Pressure Applied For 20 MINUTES    Bedrest Beginning at 1845pm  Manual:   Yes.    Patient Status During Pull:  stable  Post Pull Groin Site:  Level 0  Post Pull Instructions Given:  Yes.    Post Pull Pulses Present:  Yes.    Dressing Applied:  Yes.    Comments:  VS remans stable BP  Treated with Hydralazine 10 MG

## 2018-08-19 NOTE — Interval H&P Note (Signed)
Cath Lab Visit (complete for each Cath Lab visit)  Clinical Evaluation Leading to the Procedure:   ACS: Yes.    Non-ACS:    Anginal Classification: CCS IV  Anti-ischemic medical therapy: Minimal Therapy (1 class of medications)  Non-Invasive Test Results: No non-invasive testing performed  Prior CABG: No previous CABG      History and Physical Interval Note:  08/19/2018 4:17 PM  Tricia Harrison  has presented today for surgery, with the diagnosis of NSTEMI  The various methods of treatment have been discussed with the patient and family. After consideration of risks, benefits and other options for treatment, the patient has consented to  Procedure(s): RIGHT/LEFT HEART CATH AND CORONARY ANGIOGRAPHY (N/A) as a surgical intervention .  The patient's history has been reviewed, patient examined, no change in status, stable for surgery.  I have reviewed the patient's chart and labs.  Questions were answered to the patient's satisfaction.     Larae Grooms

## 2018-08-19 NOTE — Progress Notes (Addendum)
Progress Note  Patient Name: Tricia Harrison Date of Encounter: 08/19/2018  Primary Cardiologist: Candee Furbish, MD   Subjective   Currently CP free. No dyspnea at rest.   Inpatient Medications    Scheduled Meds: . carvedilol  6.25 mg Oral BID WC  . cholecalciferol  2,000 Units Oral Daily  . Difluprednate  1 drop Both Eyes Daily  . diltiazem  360 mg Oral Daily  . fenofibrate  54 mg Oral Daily  . [START ON 08/20/2018] Influenza vac split quadrivalent PF  0.5 mL Intramuscular Tomorrow-1000  . insulin aspart  0-15 Units Subcutaneous TID WC  . insulin aspart  0-5 Units Subcutaneous QHS  . ketorolac  1 drop Both Eyes TID  . levothyroxine  50 mcg Oral QAC breakfast  . nitroGLYCERIN  1 inch Topical Q6H  . pantoprazole  40 mg Oral Daily  . sodium chloride flush  3 mL Intravenous Q12H   Continuous Infusions: . sodium chloride    . sodium chloride 75 mL/hr at 08/19/18 0525  . heparin 950 Units/hr (08/19/18 0224)   PRN Meds: sodium chloride, acetaminophen, albuterol, bisacodyl, nitroGLYCERIN, ondansetron (ZOFRAN) IV, sodium chloride flush, zolpidem   Vital Signs    Vitals:   08/18/18 1845 08/18/18 2059 08/18/18 2143 08/19/18 0544  BP: (!) 149/76 137/67 (!) 137/59 137/71  Pulse: 70 76 68 68  Resp: 16 19 (!) 21 (!) 23  Temp:  98.6 F (37 C) (!) 97.2 F (36.2 C) 98.8 F (37.1 C)  TempSrc:  Oral Oral Oral  SpO2: 96% 93% 93% 92%  Weight:  94 kg  94.1 kg  Height:  5\' 1"  (1.549 m)      Intake/Output Summary (Last 24 hours) at 08/19/2018 0703 Last data filed at 08/19/2018 0600 Gross per 24 hour  Intake 438.12 ml  Output 0 ml  Net 438.12 ml   Filed Weights   08/18/18 1550 08/18/18 2059 08/19/18 0544  Weight: 93.9 kg 94 kg 94.1 kg    Telemetry    NSR- Personally Reviewed  ECG    Normal sinus rhythm Left axis deviation ST & T wave abnormality, consider anterolateral ischemia- Personally Reviewed  Physical Exam   GEN: Moderately obese AAF, in No acute distress.     Neck: No JVD Cardiac: RRR, no murmurs, rubs, or gallops.  Respiratory: Clear to auscultation bilaterally. GI: Soft, nontender, non-distended  MS: No edema; No deformity. Neuro:  Nonfocal  Psych: Normal affect   Labs    Chemistry Recent Labs  Lab 08/18/18 1452 08/18/18 1504  NA 142  --   K 3.8  --   CL 106  --   CO2 23  --   GLUCOSE 115*  --   BUN 22  --   CREATININE 1.32*  --   CALCIUM 10.0  --   PROT  --  7.5  ALBUMIN  --  4.1  AST  --  22  ALT  --  13  ALKPHOS  --  46  BILITOT  --  0.5  GFRNONAA 39*  --   GFRAA 45*  --   ANIONGAP 13  --      Hematology Recent Labs  Lab 08/18/18 1452  WBC 10.6*  RBC 4.15  HGB 12.4  HCT 39.0  MCV 94.0  MCH 29.9  MCHC 31.8  RDW 13.4  PLT 351    Cardiac Enzymes Recent Labs  Lab 08/18/18 2058  TROPONINI 0.14*    Recent Labs  Lab 08/18/18 1501 08/18/18 1834  TROPIPOC 0.18* 0.27*     BNPNo results for input(s): BNP, PROBNP in the last 168 hours.   DDimer No results for input(s): DDIMER in the last 168 hours.   Radiology    Dg Chest 2 View  Result Date: 08/18/2018 CLINICAL DATA:  Chronic shortness of breath for several years but increased severity yesterday associated with chest tightness. Former smoker. History of hypertension and pre diabetes. EXAM: CHEST - 2 VIEW COMPARISON:  PA and lateral chest x-ray of February 07, 2014 FINDINGS: The lungs are adequately inflated. There is no focal infiltrate. Within the mediastinum there is appears to be an air-fluid level likely in the esophagus. The heart and pulmonary vascularity are normal. The mediastinum is normal in width. There is faint calcification in the wall of the aortic arch. There is multilevel degenerative disc disease of the thoracic spine. IMPRESSION: No acute cardiopulmonary abnormality. Thoracic aortic atherosclerosis. Air in fluid level which appears to lie in the mid esophagus. This may reflect achalasia or reflux. Electronically Signed   By: David  Martinique  M.D.   On: 08/18/2018 15:39    Cardiac Studies   LHC - pending, scheduled for today.   2D Echo- pending, order placed  Patient Profile     Tricia Harrison is a 72 y.o. female with a hx of palpitations, DM (A1c 6.1), HTN, HLD, GERD, hypothyroid, DJD, who is being seen today for the evaluation  Assessment & Plan    1. NSTEMI: symptomatology and enzyme elevation c/w ACS. Presented with worsening exertional dyspnea and chest tightness>>progressing to resting symptoms. Troponin trend 0.18>>0.27>>0.14. Currently CP free. Continue w/ IV heparin and BB. Add low dose ASA and high intensity statin. Plan LHC +/- PCI with Dr. Fletcher Anon today. Hold ACEi until post cath. 2D echo pending.   2. HTN: controlled on current regimen. Continue Coreg and Cardizem. Holding ACEi until post cath.   3. DM: controlled. Hgb A1c 6.2. SSI + carb modified diet.   4. HLD: Goal LDL less than 70. FLP pending. Given ACS, will treat w/ high intensity statin. History of atorvastatin intolerance (myalgias). Will try Crestor.   5. GERD: continue Protonix.   6. Hypothyroid: continue levothyroxine.    Signed, Lyda Jester, PA-C  08/19/2018, 7:03 AM    I have seen, examined and evaluated the patient this PM along with Lyda Jester, PA-C .  After reviewing all the available data and chart, we discussed the patients laboratory, study & physical findings as well as symptoms in detail. I agree with her findings, examination as well as impression recommendations as per our discussion.    Tricia Harrison had a relatively good night last night.  No further chest tightness or pressure.  Breathing was fine as long as she was in bed. Troponin peaked at 0.23 which along with her story which is quite concerning for ACS warrants cardiac catheterization today.  I reviewed her echocardiogram as it was being done at the bedside.  It does appear to be relatively significant tricuspid regurgitation along with some septal flattening  indicative of possible pulmonary hypertension.  And the patient has been chronically dyspneic, I think it is prudent to evaluate her pulmonary retention with a right heart catheterization.   I have called the Cath Lab and had her [place]  addended to include RIGHT HEART CATHETERIZATION.  Was on fenofibrate at home.  There was a question about possibly being on pravastatin, however cannot confirm.  We will start rosuvastatin here today with possible intolerance  and to avoid interaction between fenofibrate.Tricia Harrison, M.D., M.S. Interventional Cardiologist   Pager # [phone] Phone # [phone] [address] [place],  [address]     For questions or updates, please contact [facility] Please consult [email or web address] for contact info under

## 2018-08-19 NOTE — H&P (View-Only) (Signed)
Progress Note  Patient Name: Tricia Harrison Date of Encounter: 08/19/2018  Primary Cardiologist: Tricia Furbish, MD   Subjective   Currently CP free. No dyspnea at rest.   Inpatient Medications    Scheduled Meds: . carvedilol  6.25 mg Oral BID WC  . cholecalciferol  2,000 Units Oral Daily  . Difluprednate  1 drop Both Eyes Daily  . diltiazem  360 mg Oral Daily  . fenofibrate  54 mg Oral Daily  . [START ON 08/20/2018] Influenza vac split quadrivalent PF  0.5 mL Intramuscular Tomorrow-1000  . insulin aspart  0-15 Units Subcutaneous TID WC  . insulin aspart  0-5 Units Subcutaneous QHS  . ketorolac  1 drop Both Eyes TID  . levothyroxine  50 mcg Oral QAC breakfast  . nitroGLYCERIN  1 inch Topical Q6H  . pantoprazole  40 mg Oral Daily  . sodium chloride flush  3 mL Intravenous Q12H   Continuous Infusions: . sodium chloride    . sodium chloride 75 mL/hr at 08/19/18 0525  . heparin 950 Units/hr (08/19/18 0224)   PRN Meds: sodium chloride, acetaminophen, albuterol, bisacodyl, nitroGLYCERIN, ondansetron (ZOFRAN) IV, sodium chloride flush, zolpidem   Vital Signs    Vitals:   08/18/18 1845 08/18/18 2059 08/18/18 2143 08/19/18 0544  BP: (!) 149/76 137/67 (!) 137/59 137/71  Pulse: 70 76 68 68  Resp: 16 19 (!) 21 (!) 23  Temp:  98.6 F (37 C) (!) 97.2 F (36.2 C) 98.8 F (37.1 C)  TempSrc:  Oral Oral Oral  SpO2: 96% 93% 93% 92%  Weight:  94 kg  94.1 kg  Height:  5\' 1"  (1.549 m)      Intake/Output Summary (Last 24 hours) at 08/19/2018 0703 Last data filed at 08/19/2018 0600 Gross per 24 hour  Intake 438.12 ml  Output 0 ml  Net 438.12 ml   Filed Weights   08/18/18 1550 08/18/18 2059 08/19/18 0544  Weight: 93.9 kg 94 kg 94.1 kg    Telemetry    NSR- Personally Reviewed  ECG    Normal sinus rhythm Left axis deviation ST & T wave abnormality, consider anterolateral ischemia- Personally Reviewed  Physical Exam   GEN: Moderately obese AAF, in No acute distress.     Neck: No JVD Cardiac: RRR, no murmurs, rubs, or gallops.  Respiratory: Clear to auscultation bilaterally. GI: Soft, nontender, non-distended  MS: No edema; No deformity. Neuro:  Nonfocal  Psych: Normal affect   Labs    Chemistry Recent Labs  Lab 08/18/18 1452 08/18/18 1504  NA 142  --   K 3.8  --   CL 106  --   CO2 23  --   GLUCOSE 115*  --   BUN 22  --   CREATININE 1.32*  --   CALCIUM 10.0  --   PROT  --  7.5  ALBUMIN  --  4.1  AST  --  22  ALT  --  13  ALKPHOS  --  46  BILITOT  --  0.5  GFRNONAA 39*  --   GFRAA 45*  --   ANIONGAP 13  --      Hematology Recent Labs  Lab 08/18/18 1452  WBC 10.6*  RBC 4.15  HGB 12.4  HCT 39.0  MCV 94.0  MCH 29.9  MCHC 31.8  RDW 13.4  PLT 351    Cardiac Enzymes Recent Labs  Lab 08/18/18 2058  TROPONINI 0.14*    Recent Labs  Lab 08/18/18 1501 08/18/18 1834  TROPIPOC 0.18* 0.27*     BNPNo results for input(s): BNP, PROBNP in the last 168 hours.   DDimer No results for input(s): DDIMER in the last 168 hours.   Radiology    Dg Chest 2 View  Result Date: 08/18/2018 CLINICAL DATA:  Chronic shortness of breath for several years but increased severity yesterday associated with chest tightness. Former smoker. History of hypertension and pre diabetes. EXAM: CHEST - 2 VIEW COMPARISON:  PA and lateral chest x-ray of February 07, 2014 FINDINGS: The lungs are adequately inflated. There is no focal infiltrate. Within the mediastinum there is appears to be an air-fluid level likely in the esophagus. The heart and pulmonary vascularity are normal. The mediastinum is normal in width. There is faint calcification in the wall of the aortic arch. There is multilevel degenerative disc disease of the thoracic spine. IMPRESSION: No acute cardiopulmonary abnormality. Thoracic aortic atherosclerosis. Air in fluid level which appears to lie in the mid esophagus. This may reflect achalasia or reflux. Electronically Signed   By: Tricia  Harrison  M.D.   On: 08/18/2018 15:39    Cardiac Studies   LHC - pending, scheduled for today.   2D Echo- pending, order placed  Patient Profile     Tricia Harrison is a 72 y.o. female with a hx of palpitations, DM (A1c 6.1), HTN, HLD, GERD, hypothyroid, DJD, who is being seen today for the evaluation  Assessment & Plan    1. NSTEMI: symptomatology and enzyme elevation c/w ACS. Presented with worsening exertional dyspnea and chest tightness>>progressing to resting symptoms. Troponin trend 0.18>>0.27>>0.14. Currently CP free. Continue w/ IV heparin and BB. Add low dose ASA and high intensity statin. Plan LHC +/- PCI with Tricia Harrison today. Hold ACEi until post cath. 2D echo pending.   2. HTN: controlled on current regimen. Continue Coreg and Cardizem. Holding ACEi until post cath.   3. DM: controlled. Hgb A1c 6.2. SSI + carb modified diet.   4. HLD: Goal LDL less than 70. FLP pending. Given ACS, will treat w/ high intensity statin. History of atorvastatin intolerance (myalgias). Will try Crestor.   5. GERD: continue Protonix.   6. Hypothyroid: continue levothyroxine.    Signed, Tricia Jester, PA-C  08/19/2018, 7:03 AM    I have seen, examined and evaluated the patient this PM along with Tricia Jester, PA-C .  After reviewing all the available data and chart, we discussed the patients laboratory, study & physical findings as well as symptoms in detail. I agree with her findings, examination as well as impression recommendations as per our discussion.    Tricia Harrison had a relatively good night last night.  No further chest tightness or pressure.  Breathing was fine as long as she was in bed. Troponin peaked at 0.23 which along with her story which is quite concerning for ACS warrants cardiac catheterization today.  I reviewed her echocardiogram as it was being done at the bedside.  It does appear to be relatively significant tricuspid regurgitation along with some septal flattening  indicative of possible pulmonary hypertension.  And the patient has been chronically dyspneic, I think it is prudent to evaluate her pulmonary retention with a right heart catheterization.   I have called the Cath Lab and had her [place]  addended to include RIGHT HEART CATHETERIZATION.  Was on fenofibrate at home.  There was a question about possibly being on pravastatin, however cannot confirm.  We will start rosuvastatin here today with possible intolerance  and to avoid interaction between fenofibrate.Glenetta Hew, M.D., M.S. Interventional Cardiologist   Pager # [phone] Phone # [phone] [address] [place], [place] [address]     For questions or updates, please contact [place] Please consult [email or web address] for contact info under

## 2018-08-19 NOTE — Progress Notes (Signed)
  Echocardiogram 2D Echocardiogram has been performed.  Analysa Nutting L Androw 08/19/2018, 1:35 PM

## 2018-08-19 NOTE — Progress Notes (Signed)
[facility] for Heparin Indication: chest pain/ACS  Allergies  Allergen Reactions  . Atorvastatin Other (See Comments)    Muscle pain  . Codeine Nausea And Vomiting  . Morphine And Related     B/p drops     Patient Measurements: Height: 5\' 1"  (154.9 cm) Weight: 207 lb 4.8 oz (94 kg) IBW/kg (Calculated) : 47.8 Heparin Dosing Weight: 70  Vital Signs: Temp: 97.2 F (36.2 C) (09/24 2143) Temp Source: Oral (09/24 2143) BP: 137/59 (09/24 2143) Pulse Rate: 68 (09/24 2143)  Labs: Recent Labs    08/18/18 1452 08/18/18 1821 08/18/18 2054 08/18/18 2058 08/19/18 0022  HGB 12.4  --   --   --   --   HCT 39.0  --   --   --   --   PLT 351  --   --   --   --   LABPROT  --   --  14.5  --   --   INR  --   --  1.14  --   --   HEPARINUNFRC  --  0.74*  --   --  0.28*  CREATININE 1.32*  --   --   --   --   TROPONINI  --   --   --  0.14*  --     Estimated Creatinine Clearance: 40.3 mL/min (A) (by C-G formula based on SCr of 1.32 mg/dL (H)).   Medical History: Past Medical History:  Diagnosis Date  . Anemia    as a child  . Asthma    as a child  . Chronic back pain    spinal stenosis and buldging disc;scoliosis  . Chronic constipation    occasionally takes something OTC  . Diabetes mellitus without complication ([place])    per pt "Pre" for 20+yrs  . Diverticulosis   . Diverticulosis   . Dysphagia   . Gallstones    has known about this for 3-65yrs  . GERD (gastroesophageal reflux disease)    takes Omeprazole daily  . H/O hiatal hernia   . Hemorrhoids   . History of blood transfusion    no abnormal reaction noted  . History of bronchitis    states its been a long time ago  . History of gout   . HLD (hyperlipidemia)    takes Fenofibrate daily  . HTN (hypertension)    takes Diltiazem and Ramipril daily  . Hx of gallstones   . Hypothyroidism    takes Synthroid daily  . Joint pain   . Joint swelling   . Left knee DJD   . Nocturia    . NSTEMI (non-ST elevated myocardial infarction) ([place]) 08/18/2018  . Palpitations    started in 2013 and only occasionally;last time noticed about 2-3wks ago and after drinking caffeine  . PONV (postoperative nausea and vomiting)     Assessment: 72 yo F who presented with chest pain. Heparin started for ACS/NSTEMI. Pt had Xarelto on PTA med list but denies ever taking Xarelto or any other anticoagulant. CBC WNL.   9/25 AM update: heparin level is just below goal this AM, no issues per RN.   Goal of Therapy:  Heparin level 0.3-0.7 units/ml Monitor platelets by anticoagulation protocol: Yes   Plan:  Inc heparin to 950 units/hr 1000 HL Daily heparin level, CBC, monitor for s/s of bleeding  Narda Bonds, PharmD, BCPS Clinical Pharmacist Phone: [phone]

## 2018-08-19 NOTE — Progress Notes (Signed)
[facility] for Heparin Indication: chest pain/ACS  Allergies  Allergen Reactions  . Atorvastatin Other (See Comments)    Muscle pain  . Codeine Nausea And Vomiting  . Morphine And Related     B/p drops     Patient Measurements: Height: 5\' 1"  (154.9 cm) Weight: 207 lb 6.4 oz (94.1 kg) IBW/kg (Calculated) : 47.8 Heparin Dosing Weight: 70  Vital Signs: Temp: 98.8 F (37.1 C) (09/25 0544) Temp Source: Oral (09/25 0544) BP: 137/71 (09/25 0544) Pulse Rate: 68 (09/25 0544)  Labs: Recent Labs    08/18/18 1452 08/18/18 1821 08/18/18 2054 08/18/18 2058 08/19/18 0022 08/19/18 4696 08/19/18 0927  HGB 12.4  --   --   --   --  11.7*  --   HCT 39.0  --   --   --   --  36.1  --   PLT 351  --   --   --   --  360  --   LABPROT  --   --  14.5  --   --   --   --   INR  --   --  1.14  --   --   --   --   HEPARINUNFRC  --  0.74*  --   --  0.28*  --  0.57  CREATININE 1.32*  --   --   --   --  1.46*  --   TROPONINI  --   --   --  0.14*  --  0.17*  --     Estimated Creatinine Clearance: 36.5 mL/min (A) (by C-G formula based on SCr of 1.46 mg/dL (H)).   Medical History: Past Medical History:  Diagnosis Date  . Anemia    as a child  . Asthma    as a child  . Chronic back pain    spinal stenosis and buldging disc;scoliosis  . Chronic constipation    occasionally takes something OTC  . Diabetes mellitus without complication ([place])    per pt "Pre" for 20+yrs  . Diverticulosis   . Diverticulosis   . Dysphagia   . Gallstones    has known about this for 3-35yrs  . GERD (gastroesophageal reflux disease)    takes Omeprazole daily  . H/O hiatal hernia   . Hemorrhoids   . History of blood transfusion    no abnormal reaction noted  . History of bronchitis    states its been a long time ago  . History of gout   . HLD (hyperlipidemia)    takes Fenofibrate daily  . HTN (hypertension)    takes Diltiazem and Ramipril daily  . Hx of gallstones    . Hypothyroidism    takes Synthroid daily  . Joint pain   . Joint swelling   . Left knee DJD   . Nocturia   . NSTEMI (non-ST elevated myocardial infarction) ([place]) 08/18/2018  . Palpitations    started in 2013 and only occasionally;last time noticed about 2-3wks ago and after drinking caffeine  . PONV (postoperative nausea and vomiting)     Assessment: 72 yo F who presented with chest pain. Heparin started for ACS/NSTEMI. Pt had Xarelto on PTA med list but denies ever taking Xarelto or any other anticoagulant. CBC WNL.   9/25 AM update: heparin level is now at goal after rate adjustment overnight, no issues per RN.   Plan for cath today.   Goal of Therapy:  Heparin level 0.3-0.7  units/ml Monitor platelets by anticoagulation protocol: Yes   Plan:  Heparin at 950 units/hr Follow up post cath Daily heparin level, CBC, monitor for s/s of bleeding  Erin Hearing PharmD., BCPS Clinical Pharmacist 08/19/2018 10:52 AM

## 2018-08-20 ENCOUNTER — Inpatient Hospital Stay (HOSPITAL_COMMUNITY): Payer: Medicare HMO

## 2018-08-20 ENCOUNTER — Encounter (HOSPITAL_COMMUNITY): Payer: Self-pay | Admitting: Interventional Cardiology

## 2018-08-20 DIAGNOSIS — I2609 Other pulmonary embolism with acute cor pulmonale: Secondary | ICD-10-CM

## 2018-08-20 DIAGNOSIS — I2699 Other pulmonary embolism without acute cor pulmonale: Secondary | ICD-10-CM | POA: Diagnosis present

## 2018-08-20 DIAGNOSIS — Z86711 Personal history of pulmonary embolism: Secondary | ICD-10-CM | POA: Diagnosis present

## 2018-08-20 DIAGNOSIS — R778 Other specified abnormalities of plasma proteins: Secondary | ICD-10-CM | POA: Diagnosis present

## 2018-08-20 DIAGNOSIS — R7989 Other specified abnormal findings of blood chemistry: Secondary | ICD-10-CM | POA: Diagnosis present

## 2018-08-20 LAB — GLUCOSE, CAPILLARY
GLUCOSE-CAPILLARY: 115 mg/dL — AB (ref 70–99)
Glucose-Capillary: 95 mg/dL (ref 70–99)
Glucose-Capillary: 109 mg/dL — ABNORMAL HIGH (ref 70–99)
Glucose-Capillary: 109 mg/dL — ABNORMAL HIGH (ref 70–99)

## 2018-08-20 LAB — CBC
HCT: 35.2 % — ABNORMAL LOW (ref 36.0–46.0)
Hemoglobin: 11.5 g/dL — ABNORMAL LOW (ref 12.0–15.0)
MCH: 30.3 pg (ref 26.0–34.0)
MCHC: 32.7 g/dL (ref 30.0–36.0)
MCV: 92.6 fL (ref 78.0–100.0)
PLATELETS: 350 10*3/uL (ref 150–400)
RBC: 3.8 MIL/uL — AB (ref 3.87–5.11)
RDW: 13.5 % (ref 11.5–15.5)
WBC: 9.2 10*3/uL (ref 4.0–10.5)

## 2018-08-20 LAB — D-DIMER, QUANTITATIVE: D-Dimer, Quant: 3 ug/mL-FEU — ABNORMAL HIGH (ref 0.00–0.50)

## 2018-08-20 MED ORDER — HEPARIN (PORCINE) IN NACL 100-0.45 UNIT/ML-% IJ SOLN
900.0000 [IU]/h | INTRAMUSCULAR | Status: DC
  Administered 2018-08-20 – 2018-08-22 (×2): 900 [IU]/h via INTRAVENOUS
  Filled 2018-08-20 (×3): qty 250

## 2018-08-20 MED ORDER — HEPARIN BOLUS VIA INFUSION
2000.0000 [IU] | Freq: Once | INTRAVENOUS | Status: AC
  Administered 2018-08-20: 2000 [IU] via INTRAVENOUS
  Filled 2018-08-20: qty 2000

## 2018-08-20 MED ORDER — IOPAMIDOL (ISOVUE-370) INJECTION 76%
100.0000 mL | Freq: Once | INTRAVENOUS | Status: AC | PRN
  Administered 2018-08-20: 100 mL via INTRAVENOUS

## 2018-08-20 MED ORDER — IOPAMIDOL (ISOVUE-370) INJECTION 76%
INTRAVENOUS | Status: AC
  Filled 2018-08-20: qty 100

## 2018-08-20 NOTE — Progress Notes (Addendum)
Progress Note  Patient Name: Tricia Harrison Date of Encounter: 08/20/2018  Primary Cardiologist: Candee Furbish, MD   Subjective   No significant overnight events. Patient doing well since heart catheterization yesterday. Patient notes continued shortness of breath with movement but none at rest. No chest pain, palpitations, orthopnea, or PND.   Inpatient Medications    Scheduled Meds: . aspirin EC  81 mg Oral Daily  . carvedilol  6.25 mg Oral BID WC  . cholecalciferol  2,000 Units Oral Daily  . Difluprednate  1 drop Both Eyes Daily  . diltiazem  360 mg Oral Daily  . fenofibrate  54 mg Oral Daily  . heparin  5,000 Units Subcutaneous Q8H  . Influenza vac split quadrivalent PF  0.5 mL Intramuscular Tomorrow-1000  . insulin aspart  0-15 Units Subcutaneous TID WC  . insulin aspart  0-5 Units Subcutaneous QHS  . ketorolac  1 drop Both Eyes TID  . levothyroxine  50 mcg Oral QAC breakfast  . nitroGLYCERIN  1 inch Topical Q6H  . pantoprazole  40 mg Oral Daily  . rosuvastatin  20 mg Oral q1800  . sodium chloride flush  3 mL Intravenous Q12H   Continuous Infusions: . sodium chloride     PRN Meds: sodium chloride, acetaminophen, albuterol, bisacodyl, nitroGLYCERIN, ondansetron (ZOFRAN) IV, ondansetron (ZOFRAN) IV, sodium chloride flush, zolpidem   Vital Signs    Vitals:   08/19/18 1942 08/19/18 2034 08/20/18 0431 08/20/18 0434  BP: (!) 156/86  126/62   Pulse:  74 69 72  Resp:  20 17 15   Temp:  98.7 F (37.1 C) 98.8 F (37.1 C)   TempSrc:  Oral Oral   SpO2: 95% 97% 98%   Weight:    94.2 kg  Height:        Intake/Output Summary (Last 24 hours) at 08/20/2018 0805 Last data filed at 08/20/2018 0400 Gross per 24 hour  Intake 243 ml  Output 1 ml  Net 242 ml   Filed Weights   08/18/18 2059 08/19/18 0544 08/20/18 0434  Weight: 94 kg 94.1 kg 94.2 kg    Telemetry    Normal sinus rhythm, rate 60s to 90s bpm, with occasional missed beats - Personally Reviewed  Physical  Exam   GEN: 72 year old obese African-American female resting comfortably in no acute distress.   Neck: Supple. Cardiac: RRR. No murmurs, rubs, or gallops.  Respiratory: Clear to auscultation bilaterally. GI: Abdomen soft, non-tender, non-distended  MS: No significant lower extremity edema. Femoral cath site healing well. Neuro:  Nonfocal  Psych: Normal affect   Labs    Chemistry Recent Labs  Lab 08/18/18 1452 08/18/18 1504 08/19/18 0638  NA 142  --  141  K 3.8  --  3.9  CL 106  --  107  CO2 23  --  21*  GLUCOSE 115*  --  105*  BUN 22  --  25*  CREATININE 1.32*  --  1.46*  CALCIUM 10.0  --  9.2  PROT  --  7.5 6.8  ALBUMIN  --  4.1 3.7  AST  --  22 17  ALT  --  13 11  ALKPHOS  --  46 38  BILITOT  --  0.5 0.7  GFRNONAA 39*  --  35*  GFRAA 45*  --  40*  ANIONGAP 13  --  13     Hematology Recent Labs  Lab 08/18/18 1452 08/19/18 0638 08/20/18 0403  WBC 10.6* 8.5 9.2  RBC 4.15  3.89 3.80*  HGB 12.4 11.7* 11.5*  HCT 39.0 36.1 35.2*  MCV 94.0 92.8 92.6  MCH 29.9 30.1 30.3  MCHC 31.8 32.4 32.7  RDW 13.4 13.6 13.5  PLT 351 360 350    Cardiac Enzymes Recent Labs  Lab 08/18/18 2058 08/19/18 0638 08/19/18 0927  TROPONINI 0.14* 0.17* 0.23*    Recent Labs  Lab 08/18/18 1501 08/18/18 1834  TROPIPOC 0.18* 0.27*     BNPNo results for input(s): BNP, PROBNP in the last 168 hours.   DDimer No results for input(s): DDIMER in the last 168 hours.   Radiology    Dg Chest 2 View  Result Date: 08/18/2018 CLINICAL DATA:  Chronic shortness of breath for several years but increased severity yesterday associated with chest tightness. Former smoker. History of hypertension and pre diabetes. EXAM: CHEST - 2 VIEW COMPARISON:  PA and lateral chest x-ray of February 07, 2014 FINDINGS: The lungs are adequately inflated. There is no focal infiltrate. Within the mediastinum there is appears to be an air-fluid level likely in the esophagus. The heart and pulmonary vascularity are  normal. The mediastinum is normal in width. There is faint calcification in the wall of the aortic arch. There is multilevel degenerative disc disease of the thoracic spine. IMPRESSION: No acute cardiopulmonary abnormality. Thoracic aortic atherosclerosis. Air in fluid level which appears to lie in the mid esophagus. This may reflect achalasia or reflux. Electronically Signed   By: David  Martinique M.D.   On: 08/18/2018 15:39    Cardiac Studies   RIGHT/LEFT HEART CATH 08/19/2018: Conclusion - LV end diastolic pressure is normal. - There is no aortic valve stenosis. - Hemodynamic findings consistent with mild pulmonary hypertension. - Nonobstructive CAD. - CO 6.9 L/min; CI 3.6; PA 48/20, mean PA 26 mmHg; elevated RVSP 50 mm Hg. Ao sat 96%; PA sat 73%  ECHO 08/19/2018: Study Conclusions - Left ventricle: The cavity size was normal. Wall thickness was   increased in a pattern of mild LVH. Systolic function was normal.   The estimated ejection fraction was in the range of 55% to 60%.   Incoordinate septal motion. Doppler parameters are consistent   with abnormal left ventricular relaxation (grade 1 diastolic   dysfunction). The E/e&' ratio is between 8-15, suggesting   indeterminate LV filling pressure. - Aortic valve: Sclerosis without stenosis. There was mild   regurgitation. - Mitral valve: Mildly thickened leaflets . There was trivial   regurgitation. - Left atrium: Moderately dilated. - Right atrium: The atrium was at the upper limits of normal in   size. - Atrial septum: No defect or patent foramen ovale was identified.   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 - Tricuspid valve: There was moderate regurgitation. - Pulmonary arteries: PA peak pressure: 71 mm Hg (S). - Inferior vena cava: The vessel was normal in size. The   respirophasic diameter changes were in the normal range (>= 50%),   consistent with normal central venous pressure.  Impressions: - LVEF 55-60, mild LVH, incoordinate septal  motion, grade 1 DD,   indeterminate LV filling pressure, aortic sclerosis wtih mild AI,   trivial MR, moderate LAE, aneurysmal IAS, moderate TR, RVSP 71   mmHg, normal IVC.  Patient Profile     Tricia Harrison a 72 y.o.femalewith a hx of palpitations, DM(A1c 6.1), HTN, HLD, GERD, hypothyroid, DJD,who is being seen today for the evaluation  Assessment & Plan    1. NSTEMI - Patients symptoms of worsening exertional dypspnea and chest tightness with elevated troponins (  0.18 >> 0.27 >> 0.14) increased concern for ACS.  - Right/left heart cath yesterday showed non-obstructive CAD with hemodynamic findings consistent with mild pulmonary hypertension. - Echo yesterday showed mild LVH with normal EF of 55-60% with incoordinate septal motion and grade 1 diastolic dysfunction. - Okay to restart Ramipril 10mg  daily. - Continue Aspirin 81mg  daily and Crestor 20mg  daily.  - Discontinue Coreg.   2. Hypertension - BP 126/62 this morning. - Continue Cardizem 360mg  daily. - Restart Ramipril 10mg  daily.  - Discontinue Coreg.     3. Hyperlipidemia - LDL 90. - Continue Crestor 20mg  daily. Patient may be intolerant to this.  - Discontinue fenofibrate.   4. Type 2 Diabetes Mellitus - Well controlled with Hgb A1c of 6.2. - Continue SSI and carb modified diet.  5. GERD - Continue Protonix.  6. Hypothyroid - Continue Levothyroxine.  7. Pulmonary Hypertension - Right heart catheterization yesterday consistent with mild pulmonary hypertension.  - Patient would likely benefit from pulmonary evaluation with PFTs to assess for obesity hypoventilation syndrome.    Signed, Darreld Mclean, PA-C  08/20/2018, 8:05 AM    I have seen, examined and evaluated the patient this Am along with Sande Rives, PA-C.  After reviewing all the available data and chart, we discussed the patients laboratory, study & physical findings as well as symptoms in detail. I agree with her findings, examination as  well as impression recommendations as per our discussion.    Very interestingly, she had no notable coronary disease.  Also thankfully her pulmonary pressures not as high in cath as they were on echo. Still not really sure how to explain her elevated troponin and symptoms that she had in presentation.  We will check a d-dimer today to ensure there is no evidence of possible PE. -Low threshold for checking PE protocol CT and starting DOAC if necessary.  Otherwise, would be ready for discharge if d-dimer is negative.  Would add low-dose Imdur for possible coronary spasm..  She is already on high-dose diltiazem  As for pulmonary hypertension, would recommend outpatient [facility] for OSA/OHS evaluation.  Would simply continue diltiazem.  Agree with going back to her ACE inhibitor from beta-blocker.  Weight loss recommendation.     Glenetta Hew, M.D., M.S. Interventional Cardiologist   Pager # [phone] Phone # [phone] [address] [place], [place] [address]   ADDENDUM: -Notified the d-dimer was 3.  I therefore ordered a CT angiogram to exclude PE.  Was contacted by reading radiologist for Code PE   CTA Chest: Positive for bilateral nonocclusive pulmonary emboli.  Positive for acute PE with CT evidence of right heart strain consistent with at least submassive intermediate risk PE.  No pneumonia or effusion.  Hepatic cyst noted.  At least one gallstone of 2.4 cm noted along with cardiomegaly.  Based on these results, I talked with [facility] attending Dr. Lynetta Mare.  He reviewed the images and agreed that the best course of action for now based on the fact that she is hemodynamically stable (blood pressures in the 751 mmHg and systolic and heart rate in the 70s) is to initiate IV heparin drip tonight and monitor overnight.  Reassess in the morning.  There would be concern for possible hemodynamic decompensation with this level of PE therefore we are  watching her more more night. If stable tomorrow, would likely consider converting to Eliquis and potentially even discharging home.  Would like to see her ambulate in the hallway tomorrow  to evaluate for any drop of oxygenation levels.  And if necessary, would order home oxygen.  I do think she would still benefit from pulmonary medicine consultation in the outpatient setting.  I went back into discuss this with the patient and her family.  I spent 45 to 50 minutes in the patient's room as well as additional 15 minutes talking with radiology and Dr. Lynetta Mare.  Total time with patient and chart greater than 60 minutes in addition to rounding earlier today.  Glenetta Hew, MD   For questions or updates, please contact [facility] Please consult [email or web address] for contact info under

## 2018-08-20 NOTE — Progress Notes (Signed)
[place] for Heparin Indication: New PE 9/26  Allergies  Allergen Reactions  . Atorvastatin Other (See Comments)    Muscle pain  . Codeine Nausea And Vomiting  . Morphine And Related     B/p drops     Patient Measurements: Height: 5\' 1"  (154.9 cm) Weight: 207 lb 11.2 oz (94.2 kg) IBW/kg (Calculated) : 47.8 Heparin Dosing Weight: 70  Vital Signs: Temp: 99 F (37.2 C) (09/26 1431) Temp Source: Oral (09/26 1431) BP: 109/58 (09/26 1436) Pulse Rate: 67 (09/26 1431)  Labs: Recent Labs    08/18/18 1452 08/18/18 1821 08/18/18 2054 08/18/18 2058 08/19/18 0022 08/19/18 3664 08/19/18 0927 08/20/18 0403  HGB 12.4  --   --   --   --  11.7*  --  11.5*  HCT 39.0  --   --   --   --  36.1  --  35.2*  PLT 351  --   --   --   --  360  --  350  LABPROT  --   --  14.5  --   --   --   --   --   INR  --   --  1.14  --   --   --   --   --   HEPARINUNFRC  --  0.74*  --   --  0.28*  --  0.57  --   CREATININE 1.32*  --   --   --   --  1.46*  --   --   TROPONINI  --   --   --  0.14*  --  0.17* 0.23*  --     Estimated Creatinine Clearance: 36.5 mL/min (A) (by C-G formula based on SCr of 1.46 mg/dL (H)).   Medical History: Past Medical History:  Diagnosis Date  . Anemia    as a child  . Asthma    as a child  . Chronic back pain    spinal stenosis and buldging disc;scoliosis  . Chronic constipation    occasionally takes something OTC  . Diabetes mellitus without complication ([place])    per pt "Pre" for 20+yrs  . Diverticulosis   . Diverticulosis   . Dysphagia   . Gallstones    has known about this for 3-59yrs  . GERD (gastroesophageal reflux disease)    takes Omeprazole daily  . H/O hiatal hernia   . Hemorrhoids   . History of blood transfusion    no abnormal reaction noted  . History of bronchitis    states its been a long time ago  . History of gout   . HLD (hyperlipidemia)    takes Fenofibrate daily  . HTN (hypertension)    takes Diltiazem and Ramipril daily  . Hx of gallstones   . Hypothyroidism    takes Synthroid daily  . Joint pain   . Joint swelling   . Left knee DJD   . Nocturia   . NSTEMI (non-ST elevated myocardial infarction) ([place]) 08/18/2018  . Palpitations    started in 2013 and only occasionally;last time noticed about 2-3wks ago and after drinking caffeine  . PONV (postoperative nausea and vomiting)     Assessment: 72 yo F who presented with chest pain. Heparin started for ACS/NSTEMI. Pt had Xarelto on PTA med list but denies ever taking Xarelto or any other anticoagulant. CBC WNL.  S/p cath 9/25 no CAD + SOB Chest CT 9/26 + PE.  Plan to  restart heparin.      Goal of Therapy:  Heparin level 0.3-0.7 units/ml Monitor platelets by anticoagulation protocol: Yes   Plan:  Heparin bolus 2000 uts/hr Heparin at 900 units/hr Heparin level in 6hr  Daily heparin level, CBC, monitor for s/s of bleeding  Bonnita Nasuti Pharm.D. CPP, BCPS Clinical Pharmacist [phone] 08/20/2018 6:33 PM

## 2018-08-21 ENCOUNTER — Inpatient Hospital Stay (HOSPITAL_COMMUNITY): Payer: Medicare HMO

## 2018-08-21 DIAGNOSIS — R748 Abnormal levels of other serum enzymes: Secondary | ICD-10-CM

## 2018-08-21 DIAGNOSIS — I2699 Other pulmonary embolism without acute cor pulmonale: Secondary | ICD-10-CM

## 2018-08-21 DIAGNOSIS — R0609 Other forms of dyspnea: Secondary | ICD-10-CM

## 2018-08-21 DIAGNOSIS — E039 Hypothyroidism, unspecified: Secondary | ICD-10-CM

## 2018-08-21 LAB — CBC
HEMATOCRIT: 33.6 % — AB (ref 36.0–46.0)
HEMOGLOBIN: 10.9 g/dL — AB (ref 12.0–15.0)
MCH: 30.1 pg (ref 26.0–34.0)
MCHC: 32.4 g/dL (ref 30.0–36.0)
MCV: 92.8 fL (ref 78.0–100.0)
PLATELETS: 283 10*3/uL (ref 150–400)
RBC: 3.62 MIL/uL — AB (ref 3.87–5.11)
RDW: 13.5 % (ref 11.5–15.5)
WBC: 8.2 10*3/uL (ref 4.0–10.5)

## 2018-08-21 LAB — GLUCOSE, CAPILLARY
GLUCOSE-CAPILLARY: 136 mg/dL — AB (ref 70–99)
Glucose-Capillary: 88 mg/dL (ref 70–99)
Glucose-Capillary: 106 mg/dL — ABNORMAL HIGH (ref 70–99)
Glucose-Capillary: 127 mg/dL — ABNORMAL HIGH (ref 70–99)

## 2018-08-21 LAB — HEPARIN LEVEL (UNFRACTIONATED)
HEPARIN UNFRACTIONATED: 0.36 [IU]/mL (ref 0.30–0.70)
HEPARIN UNFRACTIONATED: 0.47 [IU]/mL (ref 0.30–0.70)

## 2018-08-21 NOTE — Progress Notes (Addendum)
Progress Note  Patient Name: Tricia Harrison Date of Encounter: 08/21/2018  Primary Cardiologist: Candee Furbish, MD   Subjective   Feels comfortable at rest. Asymptomatic. Only exertional dyspnea. Radial and femoral cath access sites stable.   Inpatient Medications    Scheduled Meds: . aspirin EC  81 mg Oral Daily  . carvedilol  6.25 mg Oral BID WC  . cholecalciferol  2,000 Units Oral Daily  . Difluprednate  1 drop Both Eyes Daily  . diltiazem  360 mg Oral Daily  . fenofibrate  54 mg Oral Daily  . insulin aspart  0-15 Units Subcutaneous TID WC  . insulin aspart  0-5 Units Subcutaneous QHS  . ketorolac  1 drop Both Eyes TID  . levothyroxine  50 mcg Oral QAC breakfast  . nitroGLYCERIN  1 inch Topical Q6H  . pantoprazole  40 mg Oral Daily  . rosuvastatin  20 mg Oral q1800  . sodium chloride flush  3 mL Intravenous Q12H   Continuous Infusions: . sodium chloride    . heparin 900 Units/hr (08/20/18 1850)   PRN Meds: sodium chloride, acetaminophen, albuterol, bisacodyl, nitroGLYCERIN, ondansetron (ZOFRAN) IV, ondansetron (ZOFRAN) IV, sodium chloride flush, zolpidem   Vital Signs    Vitals:   08/20/18 1924 08/20/18 2121 08/20/18 2326 08/21/18 0545  BP: 135/66 (!) 159/65 (!) 120/58 135/67  Pulse: 71   79  Resp: 20   18  Temp: 98.8 F (37.1 C)   98.4 F (36.9 C)  TempSrc: Oral   Oral  SpO2: 95%   99%  Weight:    93.1 kg  Height:        Intake/Output Summary (Last 24 hours) at 08/21/2018 0746 Last data filed at 08/21/2018 0600 Gross per 24 hour  Intake 599.59 ml  Output 451 ml  Net 148.59 ml   Filed Weights   08/19/18 0544 08/20/18 0434 08/21/18 0545  Weight: 94.1 kg 94.2 kg 93.1 kg    Telemetry    NSR - Personally Reviewed  ECG    NSR, 70 bpm, LAD - Personally Reviewed  Physical Exam   GEN: moderately obese AAF in No acute distress.   Neck: No JVD Cardiac: RRR, no murmurs, rubs, or gallops.  Respiratory: Clear to auscultation bilaterally. GI: Soft,  nontender, non-distended  MS: No edema; No deformity. Neuro:  Nonfocal  Psych: Normal affect   Labs    Chemistry Recent Labs  Lab 08/18/18 1452 08/18/18 1504 08/19/18 0638  NA 142  --  141  K 3.8  --  3.9  CL 106  --  107  CO2 23  --  21*  GLUCOSE 115*  --  105*  BUN 22  --  25*  CREATININE 1.32*  --  1.46*  CALCIUM 10.0  --  9.2  PROT  --  7.5 6.8  ALBUMIN  --  4.1 3.7  AST  --  22 17  ALT  --  13 11  ALKPHOS  --  46 38  BILITOT  --  0.5 0.7  GFRNONAA 39*  --  35*  GFRAA 45*  --  40*  ANIONGAP 13  --  13     Hematology Recent Labs  Lab 08/18/18 1452 08/19/18 0638 08/20/18 0403  WBC 10.6* 8.5 9.2  RBC 4.15 3.89 3.80*  HGB 12.4 11.7* 11.5*  HCT 39.0 36.1 35.2*  MCV 94.0 92.8 92.6  MCH 29.9 30.1 30.3  MCHC 31.8 32.4 32.7  RDW 13.4 13.6 13.5  PLT 351 360 350  Cardiac Enzymes Recent Labs  Lab 08/18/18 2058 2018/09/04 0638 September 04, 2018 0927  TROPONINI 0.14* 0.17* 0.23*    Recent Labs  Lab 08/18/18 1501 08/18/18 1834  TROPIPOC 0.18* 0.27*     BNPNo results for input(s): BNP, PROBNP in the last 168 hours.   DDimer  Recent Labs  Lab 08/20/18 1053  DDIMER 3.00*     Radiology    Ct Angio Chest Pe W Or Wo Contrast  Result Date: 08/20/2018 CLINICAL DATA:  Short of breath chronically, suspect pulmonary embolism, positive D-dimer and chest pain, elevated troponin EXAM: CT ANGIOGRAPHY CHEST WITH CONTRAST TECHNIQUE: Multidetector CT imaging of the chest was performed using the standard protocol during bolus administration of intravenous contrast. Multiplanar CT image reconstructions and MIPs were obtained to evaluate the vascular anatomy. CONTRAST:  175mL ISOVUE-370 IOPAMIDOL (ISOVUE-370) INJECTION 76% COMPARISON:  Chest x-ray of 08/18/2018 FINDINGS: Cardiovascular: There is good opacification of the pulmonary arteries. There are filling defects bilaterally which are nonocclusive. These emboli extend into the right upper lobe, right lower lobe, and right  middle lobe with filling defects on the left extending primarily within the left upper lobe. A few small defects are present within the left lower lobe vessels as well. The RV over LV ratio measures 1.4, and this is indicative of right heart strain. Positive for acute PE with CT evidence of right heart strain (RV/LV Ratio = 1.4) consistent with at least submassive (intermediate risk) PE. The presence of right heart strain has been associated with an increased risk of morbidity and mortality. Please activate Code PE by paging [phone]. The heart is enlarged. There is no evidence of pericardial effusion. Mediastinum/Nodes: No mediastinal or hilar adenopathy is seen. The thyroid gland is not well visualized. There may be a small hiatal hernia present. Lungs/Pleura: On lung window images, no parenchymal infiltrate is seen. No pleural effusion is noted. No suspicious lung nodule is seen. The airway is patent. Upper Abdomen: Within the upper abdomen, there is and apparent cyst in the right lobe of liver of 3.4 x 5.5 cm. There does appear to be a gallstone of approximately 2.4 cm near the neck of the gallbladder but the gallbladder wall is not thickened and the gallbladder is not distended. Musculoskeletal: The thoracic vertebrae are unremarkable other than a thoracic scoliosis convex to the right. Review of the MIP images confirms the above findings. IMPRESSION: 1. Positive for bilateral nonocclusive pulmonary emboli. Positive for acute PE with CT evidence of right heart strain (RV/LV Ratio = ) consistent with at least submassive (intermediate risk) PE. The presence of right heart strain has been associated with an increased risk of morbidity and mortality. Please activate Code PE by paging [phone]. 2. No pneumonia or effusion is seen. 3. Hepatic cyst. 4. At least 1 gallstone of 2.4 cm. 5. Cardiomegaly. Electronically Signed   By: Ivar Drape M.D.   On: 08/20/2018 16:52    Cardiac Studies   RIGHT/LEFT HEART  CATH 2018/09/04: Conclusion - LV end diastolic pressure is normal. - There is no aortic valve stenosis. - Hemodynamic findings consistent with mild pulmonary hypertension. - Nonobstructive CAD. - CO 6.9 L/min; CI 3.6; PA 48/20, mean PA 26 mmHg; elevated RVSP 50 mm Hg. Ao sat 96%; PA sat 73%  ECHO 04-Sep-2018: Study Conclusions - Left ventricle: The cavity size was normal. Wall thickness was increased in a pattern of mild LVH. Systolic function was normal. The estimated ejection fraction was in the range of 55% to 60%. Incoordinate septal motion. Doppler  parameters are consistent with abnormal left ventricular relaxation (grade 1 diastolic dysfunction). The E/e&' ratio is between 8-15, suggesting indeterminate LV filling pressure. - Aortic valve: Sclerosis without stenosis. There was mild regurgitation. - Mitral valve: Mildly thickened leaflets . There was trivial regurgitation. - Left atrium: Moderately dilated. - Right atrium: The atrium was at the upper limits of normal in size. - Atrial septum: No defect or patent foramen ovale was identified. 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 - Tricuspid valve: There was moderate regurgitation. - Pulmonary arteries: PA peak pressure: 71 mm Hg (S). - Inferior vena cava: The vessel was normal in size. The respirophasic diameter changes were in the normal range (>= 50%), consistent with normal central venous pressure.  Impressions: - LVEF 55-60, mild LVH, incoordinate septal motion, grade 1 DD, indeterminate LV filling pressure, aortic sclerosis wtih mild AI, trivial MR, moderate LAE, aneurysmal IAS, moderate TR, RVSP 71 mmHg, normal IVC.   Patient Profile     Tricia Harrison a 72 y.o.femalewith a hx of palpitations, DM(A1c 6.1), HTN, HLD, GERD, hypothyroid, DJD,admitted for evaluation given exertional dyspnea, CP and elevated troponin. LHC with nonobstructive CAD. Chest CT + for bilateral PE.   Assessment & Plan     1. Chest Pain: nonobstructive CAD on LHC. Found to have bilateral PE on chest CT. See below.  -We will change final diagnosis from non-ST elevation MI to elevated troponin from demand ischemia secondary to bilateral PE.  2. Bilateral PE (submassive): bilateral PE noted on chest CT 08/20/18. Hemodynamically stable. Normal RV systolic function on echo. RHC findings c/w mild pulmonary HTN. Currently on IV heparin and will need transition to oral anticoagulant and PCP f/u for hypercoagulable w/u, as this seems to be nonprovoked. We discussed anticoagulation with PCCM. They have recommended 24 hr of IV heparin before transition to [place]. We will keep overnight on IV heparin and will transition to [facility] tomorrow morning. We will also check bilateral LEE dopplers to r/o DVT.  3. Elevated Troponin: troponin peaked at 0.23. Initially felt to be ACS, however negative LHC. Found to have bilateral PE. Suspect elevated troponin level 2/2 to PE.   4. HTN: controlled this morning at 135/67. Continue current regimen.   5.  Hyperlipidemia:  LDL 90 mg/dL. Continue Crestor 20mg  daily. TG 86 mg/dL on fenofibrate.   4. Type 2 Diabetes Mellitus: Well controlled with Hgb A1c of 6.2. Continue SSI and carb modified diet.  5. GERD:  Continue Protonix.  6. Hypothyroid:  Continue Levothyroxine.  For questions or updates, please contact [place] Please consult [email or web address] for contact info under        Signed, Lyda Jester, PA-C  08/21/2018, 7:46 AM     I have seen, examined and evaluated the patient this AM along with Ellen Henri, PA.  After reviewing all the available data and chart, we discussed the patients laboratory, study & physical findings as well as symptoms in detail. I agree with her findings, examination as well as impression recommendations as per our discussion.    Attending adjustments noted in italics.   She seems to be doing very well.  Hemodynamically stable.  It appears that  her presentation was not ACS, but was more related to PE.  Will change diagnosis from non-STEMI to PE.  As the pulmonary emboli are bilateral and considered submassive, the recommendation by PCCM is to do at least 24 to 48 hours of IV heparin.  We will therefore continue heparin overnight tonight and then likely convert to [facility] tomorrow  morning.  Would then continue with Eliquis 5 mg twice daily for 6 months uninterrupted. ->  After short period off of Eliquis, will check hypercoagulability state as we are unclear if there are any obvious causes for her PE.  Check emotionally Dopplers now to exclude any existing DVT.  She has had unilateral left leg swelling since her knee surgery and it is possible that there is some venous injury at that time.  She is relatively sedentary simply because she does not walk much because of dyspnea.  Because of her dyspnea I would want her to walk in the hallway to determine potential oxygen requirement.  She does have pulmonary pretension on both echo and cardiac catheterization.  This point I think it is prudent for her to have cardiology follow-up with me but also to see Pulmonary Medicine.  Glenetta Hew, M.D., M.S. Interventional Cardiologist   Pager # [phone] Phone # [phone] [address] [place], [place] [address]

## 2018-08-21 NOTE — Progress Notes (Signed)
[place] for Heparin Indication: New PE   Allergies  Allergen Reactions  . Atorvastatin Other (See Comments)    Muscle pain  . Codeine Nausea And Vomiting  . Morphine And Related     B/p drops     Patient Measurements: Height: 5\' 1"  (154.9 cm) Weight: 205 lb 4 oz (93.1 kg) IBW/kg (Calculated) : 47.8 Heparin Dosing Weight: 70  Vital Signs: Temp: 98.4 F (36.9 C) (09/27 0545) Temp Source: Oral (09/27 0545) BP: 135/67 (09/27 0545) Pulse Rate: 79 (09/27 0545)  Labs: Recent Labs    08/18/18 1452  08/18/18 2054 08/18/18 2058  08/19/18 2505 08/19/18 0927 08/20/18 0403 08/21/18 0115 08/21/18 0913  HGB 12.4  --   --   --   --  11.7*  --  11.5*  --  10.9*  HCT 39.0  --   --   --   --  36.1  --  35.2*  --  33.6*  PLT 351  --   --   --   --  360  --  350  --  283  LABPROT  --   --  14.5  --   --   --   --   --   --   --   INR  --   --  1.14  --   --   --   --   --   --   --   HEPARINUNFRC  --    < >  --   --    < >  --  0.57  --  0.36 0.47  CREATININE 1.32*  --   --   --   --  1.46*  --   --   --   --   TROPONINI  --   --   --  0.14*  --  0.17* 0.23*  --   --   --    < > = values in this interval not displayed.    Estimated Creatinine Clearance: 36.2 mL/min (A) (by C-G formula based on SCr of 1.46 mg/dL (H)).   Medical History: Past Medical History:  Diagnosis Date  . Anemia    as a child  . Asthma    as a child  . Chronic back pain    spinal stenosis and buldging disc;scoliosis  . Chronic constipation    occasionally takes something OTC  . Diabetes mellitus without complication ([place])    per pt "Pre" for 20+yrs  . Diverticulosis   . Diverticulosis   . Dysphagia   . Gallstones    has known about this for 3-65yrs  . GERD (gastroesophageal reflux disease)    takes Omeprazole daily  . H/O hiatal hernia   . Hemorrhoids   . History of blood transfusion    no abnormal reaction noted  . History of bronchitis    states its  been a long time ago  . History of gout   . HLD (hyperlipidemia)    takes Fenofibrate daily  . HTN (hypertension)    takes Diltiazem and Ramipril daily  . Hx of gallstones   . Hypothyroidism    takes Synthroid daily  . Joint pain   . Joint swelling   . Left knee DJD   . Nocturia   . NSTEMI (non-ST elevated myocardial infarction) ([place]) 08/18/2018  . Palpitations    started in 2013 and only occasionally;last time noticed about 2-3wks ago and after drinking caffeine  .  PONV (postoperative nausea and vomiting)     Assessment: 72 yo F who presented with chest pain. Heparin started for ACS/NSTEMI. Pt had Xarelto on PTA med list but denies ever taking Xarelto or any other anticoagulant. CBC WNL.   S/p cath 9/25 no CAD + SOB Chest CT 9/26 + PE. Pharmacy dosing heparin -HL= 0.47    Goal of Therapy:  Heparin level 0.3-0.7 units/ml Monitor platelets by anticoagulation protocol: Yes   Plan:  -no heparin changes needed -Daily heparin level and CBC -Plans noted for DOAC on 9/28  Hildred Laser, PharmD Clinical Pharmacist Please check Amion for pharmacy contact number

## 2018-08-21 NOTE — Progress Notes (Signed)
[place] for Heparin Indication: PE  Allergies  Allergen Reactions  . Atorvastatin Other (See Comments)    Muscle pain  . Codeine Nausea And Vomiting  . Morphine And Related     B/p drops     Patient Measurements: Height: 5\' 1"  (154.9 cm) Weight: 207 lb 11.2 oz (94.2 kg) IBW/kg (Calculated) : 47.8 Heparin Dosing Weight: 70  Vital Signs: Temp: 98.8 F (37.1 C) (09/26 1924) Temp Source: Oral (09/26 1924) BP: 120/58 (09/26 2326) Pulse Rate: 71 (09/26 1924)  Labs: Recent Labs    08/18/18 1452  08/18/18 2054 08/18/18 2058 08/19/18 0022 08/19/18 1610 08/19/18 0927 08/20/18 0403 08/21/18 0115  HGB 12.4  --   --   --   --  11.7*  --  11.5*  --   HCT 39.0  --   --   --   --  36.1  --  35.2*  --   PLT 351  --   --   --   --  360  --  350  --   LABPROT  --   --  14.5  --   --   --   --   --   --   INR  --   --  1.14  --   --   --   --   --   --   HEPARINUNFRC  --    < >  --   --  0.28*  --  0.57  --  0.36  CREATININE 1.32*  --   --   --   --  1.46*  --   --   --   TROPONINI  --   --   --  0.14*  --  0.17* 0.23*  --   --    < > = values in this interval not displayed.    Estimated Creatinine Clearance: 36.5 mL/min (A) (by C-G formula based on SCr of 1.46 mg/dL (H)).   Medical History: Past Medical History:  Diagnosis Date  . Anemia    as a child  . Asthma    as a child  . Chronic back pain    spinal stenosis and buldging disc;scoliosis  . Chronic constipation    occasionally takes something OTC  . Diabetes mellitus without complication ([place])    per pt "Pre" for 20+yrs  . Diverticulosis   . Diverticulosis   . Dysphagia   . Gallstones    has known about this for 3-73yrs  . GERD (gastroesophageal reflux disease)    takes Omeprazole daily  . H/O hiatal hernia   . Hemorrhoids   . History of blood transfusion    no abnormal reaction noted  . History of bronchitis    states its been a long time ago  . History of gout    . HLD (hyperlipidemia)    takes Fenofibrate daily  . HTN (hypertension)    takes Diltiazem and Ramipril daily  . Hx of gallstones   . Hypothyroidism    takes Synthroid daily  . Joint pain   . Joint swelling   . Left knee DJD   . Nocturia   . NSTEMI (non-ST elevated myocardial infarction) ([place]) 08/18/2018  . Palpitations    started in 2013 and only occasionally;last time noticed about 2-3wks ago and after drinking caffeine  . PONV (postoperative nausea and vomiting)     Assessment: 72 yo F who presented with chest pain. Heparin started  for ACS/NSTEMI. Pt had Xarelto on PTA med list but denies ever taking Xarelto or any other anticoagulant. CBC WNL.  S/p cath 9/25 no CAD + SOB Chest CT 9/26 + PE.  Plan to restart heparin.    9/27 AM update: heparin level is therapeutic this AM   Goal of Therapy:  Heparin level 0.3-0.7 units/ml Monitor platelets by anticoagulation protocol: Yes   Plan:  Cont heparin at 900 units/hr Confirmatory heparin level at [place], PharmD, [place] Pharmacist Phone: [phone]

## 2018-08-21 NOTE — Progress Notes (Signed)
SATURATION QUALIFICATIONS: (This note is used to comply with regulatory documentation for home oxygen)  Patient Saturations on Room Air at Rest = 98%  Patient Saturations on Room Air while Ambulating = 97-100%    Please briefly explain why patient needs home oxygen: pt was not SOB with exertion, no c/o dizziness or lightheadedness. Pt does not qualify for supplemental O2

## 2018-08-21 NOTE — Care Management (Signed)
08-21-18  BENEFIT CHECK :  # 5.   S/W LATONYA  @ [employer]  # [phone] OPT- 2      INS FOR  MEDICAL PLAN ONLY AND NO RX COVERAGE

## 2018-08-21 NOTE — Progress Notes (Signed)
Preliminary notes --Bilateral lower extremities venous duplex exam completed.    Left: Positive for age indeterminate deep vein thrombosis involving the left femoral vein, and left proximal profunda vein. Hongying Landry Mellow (RDMS RVT) 08/21/18 12:49 PM

## 2018-08-22 DIAGNOSIS — I2699 Other pulmonary embolism without acute cor pulmonale: Secondary | ICD-10-CM | POA: Diagnosis not present

## 2018-08-22 DIAGNOSIS — I2609 Other pulmonary embolism with acute cor pulmonale: Secondary | ICD-10-CM | POA: Diagnosis not present

## 2018-08-22 LAB — CBC
HEMATOCRIT: 31.2 % — AB (ref 36.0–46.0)
Hemoglobin: 10 g/dL — ABNORMAL LOW (ref 12.0–15.0)
MCH: 30.3 pg (ref 26.0–34.0)
MCHC: 32.1 g/dL (ref 30.0–36.0)
MCV: 94.5 fL (ref 78.0–100.0)
Platelets: 269 10*3/uL (ref 150–400)
RBC: 3.3 MIL/uL — ABNORMAL LOW (ref 3.87–5.11)
RDW: 13.7 % (ref 11.5–15.5)
WBC: 7.9 10*3/uL (ref 4.0–10.5)

## 2018-08-22 LAB — GLUCOSE, CAPILLARY
GLUCOSE-CAPILLARY: 145 mg/dL — AB (ref 70–99)
GLUCOSE-CAPILLARY: 130 mg/dL — AB (ref 70–99)
Glucose-Capillary: 102 mg/dL — ABNORMAL HIGH (ref 70–99)
Glucose-Capillary: 121 mg/dL — ABNORMAL HIGH (ref 70–99)

## 2018-08-22 NOTE — Progress Notes (Signed)
Progress Note  Patient Name: Tricia Harrison Date of Encounter: 08/22/2018  Primary Cardiologist: Candee Furbish, MD   Subjective   No CP; positive DOE  Inpatient Medications    Scheduled Meds: . aspirin EC  81 mg Oral Daily  . carvedilol  6.25 mg Oral BID WC  . cholecalciferol  2,000 Units Oral Daily  . Difluprednate  1 drop Both Eyes Daily  . diltiazem  360 mg Oral Daily  . fenofibrate  54 mg Oral Daily  . insulin aspart  0-15 Units Subcutaneous TID WC  . insulin aspart  0-5 Units Subcutaneous QHS  . ketorolac  1 drop Both Eyes TID  . levothyroxine  50 mcg Oral QAC breakfast  . nitroGLYCERIN  1 inch Topical Q6H  . pantoprazole  40 mg Oral Daily  . rosuvastatin  20 mg Oral q1800  . sodium chloride flush  3 mL Intravenous Q12H   Continuous Infusions: . sodium chloride    . heparin 900 Units/hr (08/20/18 1850)   PRN Meds: sodium chloride, acetaminophen, albuterol, bisacodyl, nitroGLYCERIN, ondansetron (ZOFRAN) IV, ondansetron (ZOFRAN) IV, sodium chloride flush, zolpidem   Vital Signs    Vitals:   08/21/18 1332 08/21/18 2020 08/22/18 0526 08/22/18 0843  BP: (!) 106/50 (!) 108/56 (!) 110/58 (!) 151/70  Pulse: 62 60 (!) 59 66  Resp:   18   Temp: 98.5 F (36.9 C) 98.5 F (36.9 C) 98.6 F (37 C)   TempSrc: Oral Oral Oral   SpO2: 100% 95% 95%   Weight:      Height:        Intake/Output Summary (Last 24 hours) at 08/22/2018 1206 Last data filed at 08/22/2018 1117 Gross per 24 hour  Intake 262.29 ml  Output -  Net 262.29 ml   Filed Weights   08/19/18 0544 08/20/18 0434 08/21/18 0545  Weight: 94.1 kg 94.2 kg 93.1 kg    Telemetry    Sinus with PVC - Personally Reviewed   Physical Exam   GEN: No acute distress.   Neck: No JVD Cardiac: RRR, no murmurs, rubs, or gallops.  Respiratory: Clear to auscultation bilaterally. GI: Soft, nontender, non-distended  MS: No edema Neuro:  Nonfocal  Psych: Normal affect   Labs    Chemistry Recent Labs  Lab  08/18/18 1452 08/18/18 1504 08/19/18 0638  NA 142  --  141  K 3.8  --  3.9  CL 106  --  107  CO2 23  --  21*  GLUCOSE 115*  --  105*  BUN 22  --  25*  CREATININE 1.32*  --  1.46*  CALCIUM 10.0  --  9.2  PROT  --  7.5 6.8  ALBUMIN  --  4.1 3.7  AST  --  22 17  ALT  --  13 11  ALKPHOS  --  46 38  BILITOT  --  0.5 0.7  GFRNONAA 39*  --  35*  GFRAA 45*  --  40*  ANIONGAP 13  --  13     Hematology Recent Labs  Lab 08/20/18 0403 08/21/18 0913 08/22/18 0614  WBC 9.2 8.2 7.9  RBC 3.80* 3.62* 3.30*  HGB 11.5* 10.9* 10.0*  HCT 35.2* 33.6* 31.2*  MCV 92.6 92.8 94.5  MCH 30.3 30.1 30.3  MCHC 32.7 32.4 32.1  RDW 13.5 13.5 13.7  PLT 350 283 269    Cardiac Enzymes Recent Labs  Lab 08/18/18 2058 08/19/18 0638 08/19/18 0927  TROPONINI 0.14* 0.17* 0.23*  Recent Labs  Lab 08/18/18 1501 08/18/18 1834  TROPIPOC 0.18* 0.27*      DDimer  Recent Labs  Lab 08/20/18 1053  DDIMER 3.00*     Radiology    Ct Angio Chest Pe W Or Wo Contrast  Result Date: 08/20/2018 CLINICAL DATA:  Short of breath chronically, suspect pulmonary embolism, positive D-dimer and chest pain, elevated troponin EXAM: CT ANGIOGRAPHY CHEST WITH CONTRAST TECHNIQUE: Multidetector CT imaging of the chest was performed using the standard protocol during bolus administration of intravenous contrast. Multiplanar CT image reconstructions and MIPs were obtained to evaluate the vascular anatomy. CONTRAST:  19mL ISOVUE-370 IOPAMIDOL (ISOVUE-370) INJECTION 76% COMPARISON:  Chest x-ray of 08/18/2018 FINDINGS: Cardiovascular: There is good opacification of the pulmonary arteries. There are filling defects bilaterally which are nonocclusive. These emboli extend into the right upper lobe, right lower lobe, and right middle lobe with filling defects on the left extending primarily within the left upper lobe. A few small defects are present within the left lower lobe vessels as well. The RV over LV ratio measures 1.4,  and this is indicative of right heart strain. Positive for acute PE with CT evidence of right heart strain (RV/LV Ratio = 1.4) consistent with at least submassive (intermediate risk) PE. The presence of right heart strain has been associated with an increased risk of morbidity and mortality. Please activate Code PE by paging [phone]. The heart is enlarged. There is no evidence of pericardial effusion. Mediastinum/Nodes: No mediastinal or hilar adenopathy is seen. The thyroid gland is not well visualized. There may be a small hiatal hernia present. Lungs/Pleura: On lung window images, no parenchymal infiltrate is seen. No pleural effusion is noted. No suspicious lung nodule is seen. The airway is patent. Upper Abdomen: Within the upper abdomen, there is and apparent cyst in the right lobe of liver of 3.4 x 5.5 cm. There does appear to be a gallstone of approximately 2.4 cm near the neck of the gallbladder but the gallbladder wall is not thickened and the gallbladder is not distended. Musculoskeletal: The thoracic vertebrae are unremarkable other than a thoracic scoliosis convex to the right. Review of the MIP images confirms the above findings. IMPRESSION: 1. Positive for bilateral nonocclusive pulmonary emboli. Positive for acute PE with CT evidence of right heart strain (RV/LV Ratio = ) consistent with at least submassive (intermediate risk) PE. The presence of right heart strain has been associated with an increased risk of morbidity and mortality. Please activate Code PE by paging [phone]. 2. No pneumonia or effusion is seen. 3. Hepatic cyst. 4. At least 1 gallstone of 2.4 cm. 5. Cardiomegaly. Electronically Signed   By: Ivar Drape M.D.   On: 08/20/2018 16:52    Patient Profile     72 y.o. female admitted with increasing dyspnea, chest heaviness and positive troponin.  Catheterization revealed no obstructive coronary disease.  CTA showed pulmonary embolus.  Lower extremity venous Dopplers showed  indeterminate age deep vein thrombosis left femoral vein and left proximal profunda vein.  Assessment & Plan    1 pulmonary embolus-plan to continue heparin today.  Will transition to apixaban tomorrow.  Tentative discharge tomorrow morning if stable.  Ambulate today.  Plan is for follow-up pulmonary after discharge and outpatient hypercoagulable work-up after she completes 6 months of anticoagulation.  2 DVT-as per #1.  3 hypertension-blood pressure is controlled.  Continue present medications.  Discontinue nitroglycerin.  4 hyperlipidemia-continue statin.  For questions or updates, please contact [facility] Please consult [email or web address]  for contact info under        Signed, Kirk Ruths, MD  08/22/2018, 12:06 PM

## 2018-08-23 ENCOUNTER — Other Ambulatory Visit: Payer: Self-pay | Admitting: Physician Assistant

## 2018-08-23 DIAGNOSIS — I2699 Other pulmonary embolism without acute cor pulmonale: Secondary | ICD-10-CM | POA: Diagnosis not present

## 2018-08-23 DIAGNOSIS — I2609 Other pulmonary embolism with acute cor pulmonale: Secondary | ICD-10-CM | POA: Diagnosis not present

## 2018-08-23 LAB — CBC
HCT: 30.3 % — ABNORMAL LOW (ref 36.0–46.0)
Hemoglobin: 9.7 g/dL — ABNORMAL LOW (ref 12.0–15.0)
MCH: 30.3 pg (ref 26.0–34.0)
MCHC: 32 g/dL (ref 30.0–36.0)
MCV: 94.7 fL (ref 78.0–100.0)
PLATELETS: 283 10*3/uL (ref 150–400)
RBC: 3.2 MIL/uL — ABNORMAL LOW (ref 3.87–5.11)
RDW: 13.7 % (ref 11.5–15.5)
WBC: 8.2 10*3/uL (ref 4.0–10.5)

## 2018-08-23 LAB — GLUCOSE, CAPILLARY: Glucose-Capillary: 80 mg/dL (ref 70–99)

## 2018-08-23 LAB — HEPARIN LEVEL (UNFRACTIONATED): Heparin Unfractionated: 0.33 IU/mL (ref 0.30–0.70)

## 2018-08-23 MED ORDER — ROSUVASTATIN CALCIUM 20 MG PO TABS: 20.0000 mg | ORAL_TABLET | Freq: Every day | ORAL | 3 refills | Status: DC

## 2018-08-23 MED ORDER — ELIQUIS 5 MG VTE STARTER PACK: ORAL_TABLET | ORAL | 0 refills | Status: DC

## 2018-08-23 MED ORDER — CARVEDILOL 6.25 MG PO TABS: 6.2500 mg | ORAL_TABLET | Freq: Two times a day (BID) | ORAL | 3 refills | Status: DC

## 2018-08-23 MED ORDER — APIXABAN 5 MG PO TABS: 5.0000 mg | ORAL_TABLET | Freq: Two times a day (BID) | ORAL | 2 refills | Status: DC

## 2018-08-23 MED ORDER — APIXABAN 5 MG PO TABS
10.0000 mg | ORAL_TABLET | Freq: Two times a day (BID) | ORAL | Status: DC
  Administered 2018-08-23: 10 mg via ORAL
  Filled 2018-08-23: qty 2

## 2018-08-23 NOTE — Progress Notes (Signed)
Spoke w patient at bedside. She states she has Rx drug coverage through [employer]. She was provided with 30 day free card for Eliquis. No other CM needs identified.

## 2018-08-23 NOTE — Progress Notes (Signed)
Patient received discharge information and acknowledged understanding of it. Patient IV was removed.  

## 2018-08-23 NOTE — Progress Notes (Signed)
Progress Note  Patient Name: Tricia Harrison Date of Encounter: 08/23/2018  Primary Cardiologist: Candee Furbish, MD   Subjective   No CP; mild DOE  Inpatient Medications    Scheduled Meds: . carvedilol  6.25 mg Oral BID WC  . cholecalciferol  2,000 Units Oral Daily  . Difluprednate  1 drop Both Eyes Daily  . diltiazem  360 mg Oral Daily  . fenofibrate  54 mg Oral Daily  . insulin aspart  0-15 Units Subcutaneous TID WC  . insulin aspart  0-5 Units Subcutaneous QHS  . ketorolac  1 drop Both Eyes TID  . levothyroxine  50 mcg Oral QAC breakfast  . pantoprazole  40 mg Oral Daily  . rosuvastatin  20 mg Oral q1800  . sodium chloride flush  3 mL Intravenous Q12H   Continuous Infusions: . sodium chloride    . heparin 900 Units/hr (08/22/18 2103)   PRN Meds: sodium chloride, acetaminophen, albuterol, bisacodyl, nitroGLYCERIN, ondansetron (ZOFRAN) IV, ondansetron (ZOFRAN) IV, sodium chloride flush, zolpidem   Vital Signs    Vitals:   08/22/18 1707 08/22/18 2002 08/23/18 0416 08/23/18 0419  BP: 135/62 130/68 (!) 157/84   Pulse: 60 (!) 59 63   Resp:  18 18   Temp:  98.2 F (36.8 C) 98.1 F (36.7 C)   TempSrc:  Oral Oral   SpO2:  99% 99%   Weight:    94.2 kg  Height:        Intake/Output Summary (Last 24 hours) at 08/23/2018 0756 Last data filed at 08/22/2018 2147 Gross per 24 hour  Intake 262.29 ml  Output 200 ml  Net 62.29 ml   Filed Weights   08/20/18 0434 08/21/18 0545 08/23/18 0419  Weight: 94.2 kg 93.1 kg 94.2 kg    Telemetry    Sinus- Personally Reviewed   Physical Exam   GEN: No acute distress.  WD WN Neck: No JVD, supple Cardiac: RRR Respiratory: CTA, no wheeze GI: Soft, NT/ND MS: No edema Neuro: Grossly intact  Labs    Chemistry Recent Labs  Lab 08/18/18 1452 08/18/18 1504 08/19/18 0638  NA 142  --  141  K 3.8  --  3.9  CL 106  --  107  CO2 23  --  21*  GLUCOSE 115*  --  105*  BUN 22  --  25*  CREATININE 1.32*  --  1.46*  CALCIUM  10.0  --  9.2  PROT  --  7.5 6.8  ALBUMIN  --  4.1 3.7  AST  --  22 17  ALT  --  13 11  ALKPHOS  --  46 38  BILITOT  --  0.5 0.7  GFRNONAA 39*  --  35*  GFRAA 45*  --  40*  ANIONGAP 13  --  13     Hematology Recent Labs  Lab 08/21/18 0913 08/22/18 0614 08/23/18 0406  WBC 8.2 7.9 8.2  RBC 3.62* 3.30* 3.20*  HGB 10.9* 10.0* 9.7*  HCT 33.6* 31.2* 30.3*  MCV 92.8 94.5 94.7  MCH 30.1 30.3 30.3  MCHC 32.4 32.1 32.0  RDW 13.5 13.7 13.7  PLT 283 269 283    Cardiac Enzymes Recent Labs  Lab 08/18/18 2058 08/19/18 0638 08/19/18 0927  TROPONINI 0.14* 0.17* 0.23*    Recent Labs  Lab 08/18/18 1501 08/18/18 1834  TROPIPOC 0.18* 0.27*      DDimer  Recent Labs  Lab 08/20/18 1053  DDIMER 3.00*     Patient Profile  72 y.o. female admitted with increasing dyspnea, chest heaviness and positive troponin.  Catheterization revealed no obstructive coronary disease.  CTA showed pulmonary embolus.  Lower extremity venous Dopplers showed indeterminate age deep vein thrombosis left femoral vein and left proximal profunda vein.  Assessment & Plan    1 pulmonary embolus-plan to discontinue heparin.  Treat with apixaban 10 mg twice daily for 1 week then 5 mg twice daily thereafter.  We will arrange outpatient pulmonary follow-up.  Plan is at least 6 months of anticoagulation.  Probable hypercoagulable work-up once anticoagulation discontinued.    2 DVT-as per #1.  3 hypertension-blood pressure is controlled this morning.  We will continue with present regimen.  4 hyperlipidemia-continue statin.  Discharge today.  Transition of care appointment with APP 1 week. Recheck BMET and CBC at that time. Follow-up Dr. Ellyn Hack 3 months.  > 30 min PA and physician time D2  For questions or updates, please contact [facility] Please consult [email or web address] for contact info under        Signed, Kirk Ruths, MD  08/23/2018, 7:56 AM

## 2018-08-23 NOTE — Discharge Summary (Signed)
Discharge Summary    Patient ID: Tricia Harrison MRN: [id number]; DOB: Sep 15, 1946  Admit date: 08/18/2018 Discharge date: 08/23/2018  Primary Care Provider: Lujean Amel, MD  Primary Cardiologist: Glenetta Hew, MD  Primary Electrophysiologist:  None   Discharge Diagnoses    Principal Problem:   Pulmonary embolism [facility]) Active Problems:   Essential hypertension   Hypothyroidism   Hyperlipidemia LDL goal <70   Morbid obesity ([place])   GERD (gastroesophageal reflux disease)   Diabetes mellitus without complication ([place])   DOE (dyspnea on exertion)   Elevated troponin   Allergies Allergies  Allergen Reactions  . Atorvastatin Other (See Comments)    Muscle pain  . Codeine Nausea And Vomiting  . Morphine And Related     B/p drops     Diagnostic Studies/Procedures    Right and left heart cath 01/05/93:  LV end diastolic pressure is normal.  There is no aortic valve stenosis.  Hemodynamic findings consistent with mild pulmonary hypertension.  Nonobstructive CAD.  CO 6.9 L/min; CI 3.6; PA 48/20, mean PA 26 mmHg; elevated RVSP 50 mm Hg. Ao sat 96%; PA sat 73%   Continue preventive therapy.    Echo 08/19/18: Study Conclusions - Left ventricle: The cavity size was normal. Wall thickness was   increased in a pattern of mild LVH. Systolic function was normal.   The estimated ejection fraction was in the range of 55% to 60%.   Incoordinate septal motion. Doppler parameters are consistent   with abnormal left ventricular relaxation (grade 1 diastolic   dysfunction). The E/e&' ratio is between 8-15, suggesting   indeterminate LV filling pressure. - Aortic valve: Sclerosis without stenosis. There was mild   regurgitation. - Mitral valve: Mildly thickened leaflets . There was trivial   regurgitation. - Left atrium: Moderately dilated. - Right atrium: The atrium was at the upper limits of normal in   size. - Atrial septum: No defect or patent foramen ovale was  identified.   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 - Tricuspid valve: There was moderate regurgitation. - Pulmonary arteries: PA peak pressure: 71 mm Hg (S). - Inferior vena cava: The vessel was normal in size. The   respirophasic diameter changes were in the normal range (>= 50%),   consistent with normal central venous pressure.  Impressions: - LVEF 55-60, mild LVH, incoordinate septal motion, grade 1 DD,   indeterminate LV filling pressure, aortic sclerosis wtih mild AI,   trivial MR, moderate LAE, aneurysmal IAS, moderate TR, RVSP 71   mmHg, normal IVC.   CTA 08/20/18: IMPRESSION: 1. Positive for bilateral nonocclusive pulmonary emboli. Positive for acute PE with CT evidence of right heart strain (RV/LV Ratio = ) consistent with at least submassive (intermediate risk) PE. The presence of right heart strain has been associated with an increased risk of morbidity and mortality. Please activate Code PE by paging [phone]. 2. No pneumonia or effusion is seen. 3. Hepatic cyst. 4. At least 1 gallstone of 2.4 cm. 5. Cardiomegaly.   Lower extremity US - DVT 08/21/18: Right: There is no evidence of deep vein thrombosis in the lower extremity. However, portions of this examination were limited- see technologist comments above. No cystic structure found in the popliteal fossa. Left: Findings consistent with age indeterminate deep vein thrombosis involving the left femoral vein, and left proximal profunda vein. No cystic structure found in the popliteal fossa.    History of Present Illness     Tricia Harrison is a 72 y.o. female with a hx of palpitations,  DM (A1c 6.1), HTN, HLD, GERD, hypothyroid, DJD. She was admitted for evaluation of chest pain, SOB, and elevated troponin.  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   Tricia Swalley has chronic DOE. Her ambulation is also limited by Tricia issues.   On 08/17/18, she walked from the bedroom to the door and was very SOB, she had to rest after this. She got chest pressure with this. The pressure resolved w/ rest. At 11:30 that evening, she woke w/ chest heaviness and nausea. She took an antacid and sx resolved and she was able to sleep. On 08/18/18, she was wheezing and felt SOB. She used her inhaler, wheezing resolved but she felt jittery. She had poor PO intake, so ate something, although she was not really hungry. She again developed chest heaviness and laid down, but her heart started pounding. It was rapid but regular. She took a Tums. Sx improved some but persisted. She went to Urgent Care and was noted to be SOB w/ minimal exertion. Her oxygen levels were low. She was sent to the ER.  In the ER, she got nitro and a GI cocktail, felt the GI cocktail helped more.  She has felt tired, weak and light-headed with ambulation, in addition to other sx.  She has had the DOE for a long time, but the sx are more pronounced now.   Hospital Course     Consultants:   Elevated troponin Troponins were elevated and Cardiology was called to admit for NSTEMI. She was given 324 mg ASA and placed on heparin drip. Decision was made to perform heart catheterization 08/19/18. Heart cath revealed mild pulmonary hypertension and nonobstructive CAD. Troponin elevation suspected to be due to right heart strain in the setting of bilateral PEs, as below.    Pulmonary embolism, left lower extremity DVT CTA and lower extremity duplex completed. CTA with bilateral nonocclusive PE with evidence of right heart strain. Lower extremity duplex revealed left LE DVT. She was started on treatment dose of eliquis: 10 mg BID x 7 days, then 5 mg BID thereafter x 6 months. Consider hypercoagulable workup after anticoagulation treatment is completed.   Ambulatory referral to pulmonary medicine  entered.   HTN Continue cardizem and coreg. Pressures well-controlled. Holding home ramipril for now.   HLD Continue statin.  _____________  Discharge Vitals Blood pressure (!) 171/75, pulse 66, temperature 98.1 F (36.7 C), temperature source Oral, resp. rate 18, height 5\' 1"  (1.549 m), weight 94.2 kg, SpO2 99 %.  Filed Weights   08/20/18 0434 08/21/18 0545 08/23/18 0419  Weight: 94.2 kg 93.1 kg 94.2 kg    Labs & Radiologic Studies    CBC Recent Labs    08/22/18 0614 08/23/18 0406  WBC 7.9 8.2  HGB 10.0* 9.7*  HCT 31.2* 30.3*  MCV 94.5 94.7  PLT 269 676   Basic Metabolic Panel No results for input(s): NA, K, CL, CO2, GLUCOSE, BUN, CREATININE, CALCIUM, MG, PHOS in the last 72 hours. Liver Function Tests No results for input(s): AST, ALT, ALKPHOS, BILITOT, PROT, ALBUMIN in the last 72 hours. No results for input(s): LIPASE, AMYLASE in the last 72 hours. Cardiac Enzymes No results for input(s): CKTOTAL, CKMB, CKMBINDEX, TROPONINI in the last 72 hours. BNP Invalid input(s): POCBNP D-Dimer Recent Labs    08/20/18 1053  DDIMER  3.00*   Hemoglobin A1C No results for input(s): HGBA1C in the last 72 hours. Fasting Lipid Panel No results for input(s): CHOL, HDL, LDLCALC, TRIG, CHOLHDL, LDLDIRECT in the last 72 hours. Thyroid Function Tests No results for input(s): TSH, T4TOTAL, T3FREE, THYROIDAB in the last 72 hours.  Invalid input(s): FREET3 _____________  Dg Chest 2 View  Result Date: 08/18/2018 CLINICAL DATA:  Chronic shortness of breath for several years but increased severity yesterday associated with chest tightness. Former smoker. History of hypertension and pre diabetes. EXAM: CHEST - 2 VIEW COMPARISON:  PA and lateral chest x-ray of February 07, 2014 FINDINGS: The lungs are adequately inflated. There is no focal infiltrate. Within the mediastinum there is appears to be an air-fluid level likely in the esophagus. The heart and pulmonary vascularity are normal.  The mediastinum is normal in width. There is faint calcification in the wall of the aortic arch. There is multilevel degenerative disc disease of the thoracic spine. IMPRESSION: No acute cardiopulmonary abnormality. Thoracic aortic atherosclerosis. Air in fluid level which appears to lie in the mid esophagus. This may reflect achalasia or reflux. Electronically Signed   By: David  Martinique M.D.   On: 08/18/2018 15:39   Ct Angio Chest Pe W Or Wo Contrast  Result Date: 08/20/2018 CLINICAL DATA:  Short of breath chronically, suspect pulmonary embolism, positive D-dimer and chest pain, elevated troponin EXAM: CT ANGIOGRAPHY CHEST WITH CONTRAST TECHNIQUE: Multidetector CT imaging of the chest was performed using the standard protocol during bolus administration of intravenous contrast. Multiplanar CT image reconstructions and MIPs were obtained to evaluate the vascular anatomy. CONTRAST:  135mL ISOVUE-370 IOPAMIDOL (ISOVUE-370) INJECTION 76% COMPARISON:  Chest x-ray of 08/18/2018 FINDINGS: Cardiovascular: There is good opacification of the pulmonary arteries. There are filling defects bilaterally which are nonocclusive. These emboli extend into the right upper lobe, right lower lobe, and right middle lobe with filling defects on the left extending primarily within the left upper lobe. A few small defects are present within the left lower lobe vessels as well. The RV over LV ratio measures 1.4, and this is indicative of right heart strain. Positive for acute PE with CT evidence of right heart strain (RV/LV Ratio = 1.4) consistent with at least submassive (intermediate risk) PE. The presence of right heart strain has been associated with an increased risk of morbidity and mortality. Please activate Code PE by paging [phone]. The heart is enlarged. There is no evidence of pericardial effusion. Mediastinum/Nodes: No mediastinal or hilar adenopathy is seen. The thyroid gland is not well visualized. There may be a small  hiatal hernia present. Lungs/Pleura: On lung window images, no parenchymal infiltrate is seen. No pleural effusion is noted. No suspicious lung nodule is seen. The airway is patent. Upper Abdomen: Within the upper abdomen, there is and apparent cyst in the right lobe of liver of 3.4 x 5.5 cm. There does appear to be a gallstone of approximately 2.4 cm near the neck of the gallbladder but the gallbladder wall is not thickened and the gallbladder is not distended. Musculoskeletal: The thoracic vertebrae are unremarkable other than a thoracic scoliosis convex to the right. Review of the MIP images confirms the above findings. IMPRESSION: 1. Positive for bilateral nonocclusive pulmonary emboli. Positive for acute PE with CT evidence of right heart strain (RV/LV Ratio = ) consistent with at least submassive (intermediate risk) PE. The presence of right heart strain has been associated with an increased risk of morbidity and mortality. Please activate Code PE by  paging [phone]. 2. No pneumonia or effusion is seen. 3. Hepatic cyst. 4. At least 1 gallstone of 2.4 cm. 5. Cardiomegaly. Electronically Signed   By: Ivar Drape M.D.   On: 08/20/2018 16:52   Disposition   Pt is being discharged home today in good condition.  Follow-up Plans & Appointments    Follow-up Information    Leonie Man, MD Follow up in 2 week(s).   Specialty:  Cardiology Why:  Office will call for appt. Contact information: [address] [place] [address] [phone]        [facility] Follow up in 2 week(s).   Specialty:  Pulmonology Why:  Office will call for an appt Contact information: [place] [phone]         Discharge Instructions    Diet - low sodium heart healthy   Complete by:  As directed    Increase activity slowly   Complete by:  As directed       Discharge Medications   Allergies as of 08/23/2018      Reactions    Atorvastatin Other (See Comments)   Muscle pain   Codeine Nausea And Vomiting   Morphine And Related    B/p drops       Medication List    STOP taking these medications   ramipril 10 MG capsule Commonly known as:  ALTACE   rivaroxaban 10 MG Tabs tablet Commonly known as:  XARELTO     TAKE these medications   acetaminophen 500 MG tablet Commonly known as:  TYLENOL Take 1,000 mg by mouth every 6 (six) hours as needed for mild pain.   albuterol 108 (90 Base) MCG/ACT inhaler Commonly known as:  PROVENTIL HFA;VENTOLIN HFA Inhale 2 puffs into the lungs every 6 (six) hours as needed.   bisacodyl 5 MG EC tablet Commonly known as:  DULCOLAX Take 2 tablets every night with dinner until bowel movement.  LAXITIVE.  Restart if two days since last bowel movement   carvedilol 6.25 MG tablet Commonly known as:  COREG Take 1 tablet (6.25 mg total) by mouth 2 (two) times daily with a meal.   diltiazem 360 MG 24 hr tablet Commonly known as:  CARDIZEM LA Take 360 mg by mouth daily.   DSS 100 MG Caps 1 tab 2 times a day while on narcotics.  STOOL SOFTENER   DUREZOL 0.05 % Emul Generic drug:  Difluprednate Place 1 drop into both eyes daily.   ELIQUIS STARTER PACK 5 MG Tabs Take as directed on package: start with two-5mg  tablets twice daily for 7 days. On day 8, switch to one-5mg  tablet twice daily.   apixaban 5 MG Tabs tablet Commonly known as:  ELIQUIS Take 1 tablet (5 mg total) by mouth 2 (two) times daily.   fenofibrate 145 MG tablet Commonly known as:  TRICOR Take 145 mg by mouth daily.   HYDROmorphone 2 MG tablet Commonly known as:  DILAUDID 1-2 tablets every 4-6 hrs as needed for pain   ketorolac 0.5 % ophthalmic solution Commonly known as:  ACULAR Place 1 drop into both eyes 3 (three) times daily.   levothyroxine 50 MCG tablet Commonly known as:  SYNTHROID, LEVOTHROID Take 50 mcg by mouth daily before breakfast.   magnesium hydroxide 800 MG/5ML  suspension Commonly known as:  MILK OF MAGNESIA Take 15 mLs by mouth daily as needed for constipation.   omeprazole 20 MG capsule Commonly known as:  PRILOSEC Take 20 mg by mouth daily.   ondansetron 4 MG tablet Commonly known as:  ZOFRAN 1-2 tablets every 6-8 hrs prn nausea   rosuvastatin 20 MG tablet Commonly known as:  CRESTOR Take 1 tablet (20 mg total) by mouth daily at 6 PM.   Vitamin D3 2000 units Tabs Take 2,000 mg by mouth daily.        Acute coronary syndrome (MI, NSTEMI, STEMI, etc) this admission?:  No.  The elevated Troponin was due to the acute medical illness or demand ischemia.    Outstanding Labs/Studies    Duration of Discharge Encounter   Greater than 30 minutes including physician time.  Signed, Tami Lin Duke, PA 08/23/2018, 10:12 AM

## 2018-08-26 DIAGNOSIS — H35353 Cystoid macular degeneration, bilateral: Secondary | ICD-10-CM | POA: Diagnosis not present

## 2018-08-26 DIAGNOSIS — H2013 Chronic iridocyclitis, bilateral: Secondary | ICD-10-CM | POA: Diagnosis not present

## 2018-08-26 NOTE — Progress Notes (Signed)
[facility] Office Note   Date:  08/27/2018   ID:  Tricia Harrison, DOB [birth year], MRN [id number]  PCP:  Lujean Amel, MD  Cardiologist: Dr. Ellyn Hack    Chief Complaint  Patient presents with  . Chest Pain    Bilateral PE  . Hypertension  . Hospitalization Follow-up     History of Present Illness: Tricia Harrison is a 72 y.o. female who presents for post hospitalization follow up. She was admitted with chest pain, dyspnea and elevated troponin. She had subsequent right and left cardiac cath, which revealed non-obstructive CAD, she was found to have bilateral PE's, with evidence of right heart strain. Doppler studies of the LE which were positive for left DVT. She was started on Eliquis 5 mg BID after loading of 10 mg BID for 7 days. She was taken off of ramipril and continued on diltiazem and coreg.    She comes today with many questions. She denies recurrent chest pain, has improved breathing status.She is not very active. Her daughter states that she lays in bed a lot. The patient states it is because she has back pain that limits her ambulation.    Past Medical History:  Diagnosis Date  . Anemia    as a child  . Asthma    as a child  . Chronic back pain    spinal stenosis and buldging disc;scoliosis  . Chronic constipation    occasionally takes something OTC  . Diabetes mellitus without complication ([place])    per pt "Pre" for 20+yrs  . Diverticulosis   . Diverticulosis   . Dysphagia   . Gallstones    has known about this for 3-62yrs  . GERD (gastroesophageal reflux disease)    takes Omeprazole daily  . H/O hiatal hernia   . Hemorrhoids   . History of blood transfusion    no abnormal reaction noted  . History of bronchitis    states its been a long time ago  . History of gout   . HLD (hyperlipidemia)    takes Fenofibrate daily  . HTN (hypertension)    takes Diltiazem and Ramipril daily  . Hx of gallstones   . Hypothyroidism    takes Synthroid daily  . Joint  pain   . Joint swelling   . Left knee DJD   . Nocturia   . NSTEMI (non-ST elevated myocardial infarction) ([place]) 08/18/2018  . Palpitations    started in 2013 and only occasionally;last time noticed about 2-3wks ago and after drinking caffeine  . PONV (postoperative nausea and vomiting)     Past Surgical History:  Procedure Laterality Date  . COLONOSCOPY    . DILATION AND CURETTAGE OF UTERUS     multiple about 6-7 times  . ESOPHAGOGASTRODUODENOSCOPY    . RIGHT/LEFT HEART CATH AND CORONARY ANGIOGRAPHY N/A 08/19/2018   Procedure: RIGHT/LEFT HEART CATH AND CORONARY ANGIOGRAPHY;  Surgeon: Jettie Booze, MD;  Location: [facility];  Service: Cardiovascular;  Laterality: N/A;  . THYROID SURGERY Right 2010  . TOTAL HIP ARTHROPLASTY Left 2005  . TOTAL KNEE ARTHROPLASTY Left 02/21/2014   Procedure: TOTAL KNEE ARTHROPLASTY;  Surgeon: Lorn Junes, MD;  Location: [facility];  Service: Orthopedics;  Laterality: Left;  Marland Kitchen VAGINAL HYSTERECTOMY  1979     Current Outpatient Medications  Medication Sig Dispense Refill  . acetaminophen (TYLENOL) 500 MG tablet Take 1,000 mg by mouth every 6 (six) hours as needed for mild pain.    Marland Kitchen albuterol (PROVENTIL HFA;VENTOLIN HFA) 108 (  90 Base) MCG/ACT inhaler Inhale 2 puffs into the lungs every 6 (six) hours as needed.  1  . apixaban (ELIQUIS) 5 MG TABS tablet Take 1 tablet (5 mg total) by mouth 2 (two) times daily. 180 tablet 2  . bisacodyl (DULCOLAX) 5 MG EC tablet Take 2 tablets every night with dinner until bowel movement.  LAXITIVE.  Restart if two days since last bowel movement 30 tablet 0  . carvedilol (COREG) 6.25 MG tablet Take 1 tablet (6.25 mg total) by mouth 2 (two) times daily with a meal. 180 tablet 3  . Cholecalciferol (VITAMIN D3) 2000 UNITS TABS Take 2,000 mg by mouth daily.    Marland Kitchen diltiazem (CARDIZEM LA) 360 MG 24 hr tablet Take 360 mg by mouth daily.    . DUREZOL 0.05 % EMUL Place 1 drop into both eyes daily.  3  . ELIQUIS STARTER PACK  (ELIQUIS STARTER PACK) 5 MG TABS Take as directed on package: start with two-5mg  tablets twice daily for 7 days. On day 8, switch to one-5mg  tablet twice daily. 1 each 0  . fenofibrate (TRICOR) 145 MG tablet Take 145 mg by mouth daily.    Marland Kitchen ketorolac (ACULAR) 0.5 % ophthalmic solution Place 1 drop into both eyes 3 (three) times daily.  3  . levothyroxine (SYNTHROID, LEVOTHROID) 50 MCG tablet Take 50 mcg by mouth daily before breakfast.    . magnesium hydroxide (MILK OF MAGNESIA) 800 MG/5ML suspension Take 15 mLs by mouth daily as needed for constipation.    Marland Kitchen omeprazole (PRILOSEC) 20 MG capsule Take 20 mg by mouth daily.    . rosuvastatin (CRESTOR) 20 MG tablet Take 1 tablet (20 mg total) by mouth daily at 6 PM. 90 tablet 3   No current facility-administered medications for this visit.     Allergies:   Atorvastatin; Codeine; and Morphine and related    Social History:  The patient  reports that she quit smoking about 24 years ago. She has never used smokeless tobacco. She reports that she drinks alcohol. She reports that she does not use drugs.   Family History:  The patient's family history includes Cirrhosis in her father; Diabetes in her mother and sister; Heart disease in her mother; Hypertension in her mother; Lymphoma in her sister; Pneumonia in her father; Uterine cancer in her mother.    ROS: All other systems are reviewed and negative. Unless otherwise mentioned in H&P    PHYSICAL EXAM: VS:  Wt 209 lb (94.8 kg)   BMI 39.49 kg/m  , BMI Body mass index is 39.49 kg/m. GEN: Well nourished, well developed, in no acute distress, obese.  HEENT: normal Neck: no JVD, carotid bruits, or masses Cardiac: RRR; no murmurs, rubs, or gallops,no edema  Respiratory:  Clear to auscultation bilaterally, normal work of breathing GI: soft, nontender, nondistended, + BS MS: no deformity or atrophy Skin: warm and dry, no rash Neuro:  Strength and sensation are intact Psych: euthymic mood, full  affect   EKG:  Not completed this office visit.   Recent Labs: 08/18/2018: TSH 1.932 08/19/2018: ALT 11; BUN 25; Creatinine, Ser 1.46; Potassium 3.9; Sodium 141 08/23/2018: Hemoglobin 9.7; Platelets 283    Lipid Panel    Component Value Date/Time   CHOL 159 08/19/2018 0638   TRIG 86 08/19/2018 0638   HDL 52 08/19/2018 0638   CHOLHDL 3.1 08/19/2018 0638   VLDL 17 08/19/2018 0638   LDLCALC 90 08/19/2018 0638      Wt Readings from Last 3  Encounters:  08/27/18 209 lb (94.8 kg)  08/23/18 207 lb 9.6 oz (94.2 kg)  02/21/14 210 lb (95.3 kg)     Other studies Reviewed: Conclusion     LV end diastolic pressure is normal.  There is no aortic valve stenosis.  Hemodynamic findings consistent with mild pulmonary hypertension.  Nonobstructive CAD.  CO 6.9 L/min; CI 3.6; PA 48/20, mean PA 26 mmHg; elevated RVSP 50 mm Hg. Ao sat 96%; PA sat 73%   Echocardiogram 02/16/2018 Left ventricle: The cavity size was normal. Wall thickness was   increased in a pattern of mild LVH. Systolic function was normal.   The estimated ejection fraction was in the range of 55% to 60%.   Incoordinate septal motion. Doppler parameters are consistent   with abnormal left ventricular relaxation (grade 1 diastolic   dysfunction). The E/e&' ratio is between 8-15, suggesting   indeterminate LV filling pressure. - Aortic valve: Sclerosis without stenosis. There was mild   regurgitation. - Mitral valve: Mildly thickened leaflets . There was trivial   regurgitation. - Left atrium: Moderately dilated. - Right atrium: The atrium was at the upper limits of normal in   size. - Atrial septum: No defect or patent foramen ovale was identified.   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 - Tricuspid valve: There was moderate regurgitation. - Pulmonary arteries: PA peak pressure: 71 mm Hg (S). - Inferior vena cava: The vessel was normal in size. The   respirophasic diameter changes were in the normal range (>= 50%),   consistent  with normal central venous pressure.  ASSESSMENT AND PLAN:  1. Bilateral Pulmonary Emboli with Left DVT: Now on Eliquis 5 mg BID. I will check BMET for kidney function as she has history of renal dysfunction. I have given her refills on Eliquis for 90 days.      Check CBC on anticoagulation.               2. Pulmonary Hypertension:  Likely related to PE. Follow up echo should be completed in 6 months.   3. Chronic Bronchitis: She is to see Pulmonology on consultation this week.   4. Hypercholesterolemia; She is to continue statin therapy. She will have repeat Lipids and LFT;s in 6 weeks with above blood draw.   5. Deconditioning: I have advised her to get out of bed and walk daily, She has taken water aerobics in the past and enjoyed it. I have encouraged her to to this again.   Current medicines are reviewed at length with the patient today.    Labs/ tests ordered today include: BMET, CBC, Lipids and LFT's  Phill Myron. West Pugh, ANP, AACC   08/27/2018 11:15 AM    [address] Office [phone] Fax [phone]

## 2018-08-27 ENCOUNTER — Ambulatory Visit: Payer: Medicare HMO | Admitting: Adult Health

## 2018-08-27 ENCOUNTER — Encounter: Payer: Self-pay | Admitting: Adult Health

## 2018-08-27 VITALS — BP 126/69 | HR 58 | Ht 61.0 in | Wt 209.0 lb

## 2018-08-27 DIAGNOSIS — I82402 Acute embolism and thrombosis of unspecified deep veins of left lower extremity: Secondary | ICD-10-CM

## 2018-08-27 DIAGNOSIS — I10 Essential (primary) hypertension: Secondary | ICD-10-CM | POA: Diagnosis not present

## 2018-08-27 DIAGNOSIS — Z79899 Other long term (current) drug therapy: Secondary | ICD-10-CM

## 2018-08-27 DIAGNOSIS — E7800 Pure hypercholesterolemia, unspecified: Secondary | ICD-10-CM | POA: Diagnosis not present

## 2018-08-27 DIAGNOSIS — I2694 Multiple subsegmental pulmonary emboli without acute cor pulmonale: Secondary | ICD-10-CM | POA: Diagnosis not present

## 2018-08-27 MED ORDER — ROSUVASTATIN CALCIUM 20 MG PO TABS: 20.0000 mg | ORAL_TABLET | Freq: Every day | ORAL | 1 refills | Status: DC

## 2018-08-27 MED ORDER — CARVEDILOL 6.25 MG PO TABS: 6.2500 mg | ORAL_TABLET | Freq: Two times a day (BID) | ORAL | 1 refills | Status: DC

## 2018-08-27 MED ORDER — APIXABAN 5 MG PO TABS: 5.0000 mg | ORAL_TABLET | Freq: Two times a day (BID) | ORAL | 1 refills | Status: DC

## 2018-08-27 NOTE — Patient Instructions (Signed)
Medication Instructions:  NO CHANGES- Your physician recommends that you continue on your current medications as directed. Please refer to the Current Medication list given to you today.  If you need a refill on your cardiac medications before your next appointment, please call your pharmacy.  Labwork: CBC,BMET,LFP AND LFT IN 6 WEEKS (10-08-2018) HERE IN OUR OFFICE AT [facility]  Take the provided lab slips with you to the lab for your blood draw.   Special Instructions: MAKE SURE TO GET UP AND MOVE DAILY!  Follow-Up: Your physician wants you to follow-up in: 3 MONTHS WITH DR HARDING.  Thank you for choosing [facility] at [facility]!!

## 2018-08-28 DIAGNOSIS — H3581 Retinal edema: Secondary | ICD-10-CM | POA: Diagnosis not present

## 2018-08-28 DIAGNOSIS — H30033 Focal chorioretinal inflammation, peripheral, bilateral: Secondary | ICD-10-CM | POA: Diagnosis not present

## 2018-08-28 DIAGNOSIS — Z961 Presence of intraocular lens: Secondary | ICD-10-CM | POA: Diagnosis not present

## 2018-09-02 ENCOUNTER — Telehealth: Payer: Self-pay | Admitting: Adult Health

## 2018-09-02 NOTE — Telephone Encounter (Signed)
Received a call from [place] with [facility] calling to verify patient's cardiologist.Advised Dr.Harding is patient's cardiologist.

## 2018-09-04 ENCOUNTER — Ambulatory Visit: Payer: Medicare HMO | Admitting: Internal Medicine

## 2018-09-04 ENCOUNTER — Encounter: Payer: Self-pay | Admitting: Internal Medicine

## 2018-09-04 VITALS — BP 138/78 | HR 57 | Ht 61.0 in | Wt 209.0 lb

## 2018-09-04 DIAGNOSIS — I2729 Other secondary pulmonary hypertension: Secondary | ICD-10-CM | POA: Diagnosis not present

## 2018-09-04 DIAGNOSIS — R7989 Other specified abnormal findings of blood chemistry: Secondary | ICD-10-CM

## 2018-09-04 DIAGNOSIS — R778 Other specified abnormalities of plasma proteins: Secondary | ICD-10-CM

## 2018-09-04 DIAGNOSIS — G4733 Obstructive sleep apnea (adult) (pediatric): Secondary | ICD-10-CM

## 2018-09-04 DIAGNOSIS — R0609 Other forms of dyspnea: Secondary | ICD-10-CM | POA: Diagnosis not present

## 2018-09-04 DIAGNOSIS — I2602 Saddle embolus of pulmonary artery with acute cor pulmonale: Secondary | ICD-10-CM | POA: Diagnosis not present

## 2018-09-04 NOTE — Progress Notes (Signed)
OV 09/04/2018  Subjective:  Patient ID: Tricia Harrison, female , DOB: 1946/10/05 , age 72 y.o. , MRN: [id number] , ADDRESS: [place] [address]   09/04/2018 -   Chief Complaint  Patient presents with  . Consult    Has Sob for the last 6 years , but on 08/17/18 she had abnormal sob but it went away, Later that night it came back but worse thought it was gerd. then went away  but the next morning it came back  she went to the hospital.     HPI Tricia Harrison 72 y.o. -   IMPRESSION: 1. Positive for bilateral nonocclusive pulmonary emboli. Positive for acute PE with CT evidence of right heart strain (RV/LV Ratio = ) consistent with at least submassive (intermediate risk) PE. The presence of right heart strain has been associated with an increased risk of morbidity and mortality. Please activate Code PE by paging [phone]. 2. No pneumonia or effusion is seen. 3. Hepatic cyst. 4. At least 1 gallstone of 2.4 cm. 5. Cardiomegaly.   Electronically Signed   By: Ivar Drape M.D.   On: 08/20/2018 16:52   ROS - per HPI     has a past medical history of Anemia, Asthma, Chronic back pain, Chronic constipation, Diabetes mellitus without complication ([place]), Diverticulosis, Diverticulosis, Dysphagia, Gallstones, GERD (gastroesophageal reflux disease), H/O hiatal hernia, Hemorrhoids, History of blood transfusion, History of bronchitis, History of gout, HLD (hyperlipidemia), HTN (hypertension), gallstones, Hypothyroidism, Joint pain, Joint swelling, Left knee DJD, Nocturia, NSTEMI (non-ST elevated myocardial infarction) ([place]) (08/18/2018), Palpitations, and PONV (postoperative nausea and vomiting).   reports that she quit smoking about 24 years ago. She has never used smokeless tobacco.  Past Surgical History:  Procedure Laterality Date  . COLONOSCOPY    . DILATION AND CURETTAGE OF UTERUS     multiple about 6-7 times  .  ESOPHAGOGASTRODUODENOSCOPY    . RIGHT/LEFT HEART CATH AND CORONARY ANGIOGRAPHY N/A 08/19/2018   Procedure: RIGHT/LEFT HEART CATH AND CORONARY ANGIOGRAPHY;  Surgeon: Jettie Booze, MD;  Location: [facility];  Service: Cardiovascular;  Laterality: N/A;  . THYROID SURGERY Right 2010  . TOTAL HIP ARTHROPLASTY Left 2005  . TOTAL KNEE ARTHROPLASTY Left 02/21/2014   Procedure: TOTAL KNEE ARTHROPLASTY;  Surgeon: Lorn Junes, MD;  Location: [facility];  Service: Orthopedics;  Laterality: Left;  Marland Kitchen VAGINAL HYSTERECTOMY  1979    Allergies  Allergen Reactions  . Atorvastatin Other (See Comments)    Muscle pain  . Codeine Nausea And Vomiting  . Morphine And Related     B/p drops     Immunization History  Administered Date(s) Administered  . Influenza, High Dose Seasonal PF 08/20/2018    Family History  Problem Relation Age of Onset  . Uterine cancer Mother   . Heart disease Mother   . Hypertension Mother   . Diabetes Mother   . Cirrhosis Father   . Pneumonia Father   . Lymphoma Sister        ,non hodgkin   . Diabetes Sister      Current Outpatient Medications:  .  acetaminophen (TYLENOL) 500 MG tablet, Take 1,000 mg by mouth every 6 (six) hours as needed for mild pain., Disp: , Rfl:  .  albuterol (PROVENTIL HFA;VENTOLIN HFA) 108 (90 Base) MCG/ACT inhaler, Inhale 2 puffs into the lungs every 6 (six) hours as needed., Disp: , Rfl: 1 .  apixaban (ELIQUIS) 5 MG TABS tablet, Take  1 tablet (5 mg total) by mouth 2 (two) times daily., Disp: 180 tablet, Rfl: 1 .  bisacodyl (DULCOLAX) 5 MG EC tablet, Take 2 tablets every night with dinner until bowel movement.  LAXITIVE.  Restart if two days since last bowel movement, Disp: 30 tablet, Rfl: 0 .  carvedilol (COREG) 6.25 MG tablet, Take 1 tablet (6.25 mg total) by mouth 2 (two) times daily with a meal., Disp: 180 tablet, Rfl: 1 .  Cholecalciferol (VITAMIN D3) 2000 UNITS TABS, Take 2,000 mg by mouth daily., Disp: , Rfl:  .  diltiazem  (CARDIZEM LA) 360 MG 24 hr tablet, Take 360 mg by mouth daily., Disp: , Rfl:  .  DUREZOL 0.05 % EMUL, Place 1 drop into both eyes daily., Disp: , Rfl: 3 .  fenofibrate (TRICOR) 145 MG tablet, Take 145 mg by mouth daily., Disp: , Rfl:  .  ketorolac (ACULAR) 0.5 % ophthalmic solution, Place 1 drop into both eyes 3 (three) times daily., Disp: , Rfl: 3 .  levothyroxine (SYNTHROID, LEVOTHROID) 50 MCG tablet, Take 50 mcg by mouth daily before breakfast., Disp: , Rfl:  .  magnesium hydroxide (MILK OF MAGNESIA) 800 MG/5ML suspension, Take 15 mLs by mouth daily as needed for constipation., Disp: , Rfl:  .  omeprazole (PRILOSEC) 20 MG capsule, Take 20 mg by mouth daily., Disp: , Rfl:  .  rosuvastatin (CRESTOR) 20 MG tablet, Take 1 tablet (20 mg total) by mouth daily at 6 PM., Disp: 90 tablet, Rfl: 1      Objective:   There were no vitals filed for this visit.  Estimated body mass index is 39.49 kg/m as calculated from the following:   Height as of 08/27/18: 5\' 1"  (1.549 m).   Weight as of 08/27/18: 209 lb (94.8 kg).  @WEIGHTCHANGE @  There were no vitals filed for this visit.   Physical Exam         Assessment:     No diagnosis found.     Plan:     There are no Patient Instructions on file for this visit.    SIGNATURE    Dr. Brand Males, M.D., F.C.C.P,  Pulmonary and Critical Care Medicine Staff Physician, [facility] Director - Interstitial Lung Disease  Program  Pulmonary [facility] at [place], [place], [address]  Pager: [phone], If no answer or between  15:00h - 7:00h: call [phone] Telephone: [phone]  9:56 AM 09/04/2018

## 2018-09-04 NOTE — Patient Instructions (Addendum)
ICD-10-CM   1. Acute saddle pulmonary embolism with acute cor pulmonale (HCC) I26.02   2. Elevated troponin R79.89   3. 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 Morbid obesity ([place]) E66.01   5. DOE (dyspnea on exertion) R06.09    Acute saddle pulmonary embolism with acute cor pulmonale (HCC) Elevated troponin  - continue eliquis  - need prolonged treatment for over a year or two as long as there is no bleeding risk  - you are at fall risk and do not fall  - ok to fly - recommend avoiding procedures for 3 months from 08/19/18 to extent possible but if needed hold eliquis for 48h and restart 48h later -Refer to hematology Dr. Beryle Beams the internal medicine teaching service -Do follow-up VQ scan end of December 2019 or early January 2020; indications rule out chronic pulmonary embolism  Other secondary pulmonary hypertension ([facility]) Morbid obesity ([place])  -Pulmonary hypertension discovered in August 19, 2018 was in part due to the acute pulmonary embolism but you could have chronic pulmonary hypertension related to underlying chronic pulmonary embolism or previous weight loss drug intake 20 years ago or sleep apnea or other causes -When you see Dr. Beryle Beams please get a autoimmune work-up -Do repeat echocardiogram end of December 2019 or early January 2020  DOE (dyspnea on exertion) -Due to all of the above - check ONO on room air - concern for OSA  Follow-up -End of December 2019 early January 2020 but after VQ scan and echocardiogram and seeing Dr. Beryle Beams

## 2018-09-04 NOTE — Progress Notes (Signed)
Subjective:    Patient ID: Tricia Harrison, female    DOB: 11/22/1946, 72 y.o.   MRN: [id number]  HPI    IOV 09/04/2018  Chief Complaint  Patient presents with  . Consult    Has Sob for the last 6 years , but on 08/17/18 she had abnormal sob but it went away, Later that night it came back but worse thought it was gerd. then went away  but the next morning it came back  she went to the hospital.   72 year old obese African-American female.  She is originally from [place] but retired and moved to [place].  She lives in [place] on [address].  Her son suffers from rectal cancer.  She has morbid obesity and spinal stenosis.  She does not have multiple sclerosis.  Because of this she is ECOG 4 and mostly sedentary.  She gets around and does activities of daily living and travels.  She reports that since 2012 she has had insidious onset of shortness of breath that was stable until around 2015 or 16 when she had her talk total knee replacement on the left side.  After this she had chronic left lower extremity edema and worsening shortness of breath on exertion.  Since then progressive worsening of shortness of breath that then on August 19, 2018 had a dramatic deterioration in 1 day and resulted in hypoxemia and admission to the hospital.  Review of the hospital records indicate that because of elevated troponin she was under cardiology service and had a cardiac cath that showed nonobstructive coronary artery disease [I reviewed the old chart].  She then had echocardiogram that showed elevated pulmonary artery systolic pressure 71 mmHg.  This prompted CT angiogram for PE and she had bilateral pulmonary embolism.  She has been started on Eliquis and subsequently discharged.  Since discharge her dyspnea is improved but only the portion of acute deterioration over a day.  She is back at a chronic dyspnea on exertion which means activities of daily living and shopping which as is limited by  mobility is now difficult with shortness of breath but this is chronic.  She uses a cane.  She says she is at fall risk.  She also has eye issues and sometimes might need an injection into her eye for retinal detachment.  But at this point this is not being contemplated.  Pulmonary embolism risk factors that I could identify include family history of cancer but no personal history of cancer.  Remote smoking.  Obesity and sedentary lifestyle and left total knee replacement.  No personal history of malignancy.  No clotting risk factors no family history of clotting.  Risk factors for pulmonary hypertension include morbid obesity, possible sleep apnea undiagnosed and also 20 years ago she took weight loss drugs that based on her description appears to be a stimulant.  She says she took it for over a month.  No autoimmune disease reported.  She does not want blood testing today for autoimmune work-up because she is afraid of needle sticks.   CTA 08/20/18 - personally vsiualied Old records - reviewed Labs reveiewd - trop up   has a past medical history of Anemia, Asthma, Chronic back pain, Chronic constipation, Diabetes mellitus without complication ([facility]), Diverticulosis, Diverticulosis, Dysphagia, Gallstones, GERD (gastroesophageal reflux disease), H/O hiatal hernia, Hemorrhoids, History of blood transfusion, History of bronchitis, History of gout, HLD (hyperlipidemia), HTN (hypertension), gallstones, Hypothyroidism, Joint pain, Joint swelling, Left knee DJD, Nocturia, NSTEMI (non-ST elevated  myocardial infarction) ([facility]) (08/18/2018), Palpitations, and PONV (postoperative nausea and vomiting).   reports that she quit smoking about 24 years ago. Her smoking use included cigarettes. She started smoking about 54 years ago. She has a 10.00 pack-year smoking history. She has never used smokeless tobacco.  Past Surgical History:  Procedure Laterality Date  . COLONOSCOPY    . DILATION AND CURETTAGE OF UTERUS       multiple about 6-7 times  . ESOPHAGOGASTRODUODENOSCOPY    . RIGHT/LEFT HEART CATH AND CORONARY ANGIOGRAPHY N/A 08/19/2018   Procedure: RIGHT/LEFT HEART CATH AND CORONARY ANGIOGRAPHY;  Surgeon: Jettie Booze, MD;  Location: [facility];  Service: Cardiovascular;  Laterality: N/A;  . THYROID SURGERY Right 2010  . TOTAL HIP ARTHROPLASTY Left 2005  . TOTAL KNEE ARTHROPLASTY Left 02/21/2014   Procedure: TOTAL KNEE ARTHROPLASTY;  Surgeon: Lorn Junes, MD;  Location: [facility];  Service: Orthopedics;  Laterality: Left;  Marland Kitchen VAGINAL HYSTERECTOMY  1979    Allergies  Allergen Reactions  . Atorvastatin Other (See Comments)    Muscle pain  . Codeine Nausea And Vomiting  . Morphine And Related     B/p drops     Immunization History  Administered Date(s) Administered  . Influenza, High Dose Seasonal PF 08/20/2018    Family History  Problem Relation Age of Onset  . Uterine cancer Mother   . Heart disease Mother   . Hypertension Mother   . Diabetes Mother   . Cirrhosis Father   . Pneumonia Father   . Lymphoma Sister        ,non hodgkin   . Diabetes Sister      Current Outpatient Medications:  .  acetaminophen (TYLENOL) 500 MG tablet, Take 1,000 mg by mouth every 6 (six) hours as needed for mild pain., Disp: , Rfl:  .  albuterol (PROVENTIL HFA;VENTOLIN HFA) 108 (90 Base) MCG/ACT inhaler, Inhale 2 puffs into the lungs every 6 (six) hours as needed., Disp: , Rfl: 1 .  apixaban (ELIQUIS) 5 MG TABS tablet, Take 1 tablet (5 mg total) by mouth 2 (two) times daily., Disp: 180 tablet, Rfl: 1 .  bisacodyl (DULCOLAX) 5 MG EC tablet, Take 2 tablets every night with dinner until bowel movement.  LAXITIVE.  Restart if two days since last bowel movement, Disp: 30 tablet, Rfl: 0 .  carvedilol (COREG) 6.25 MG tablet, Take 1 tablet (6.25 mg total) by mouth 2 (two) times daily with a meal., Disp: 180 tablet, Rfl: 1 .  Cholecalciferol (VITAMIN D3) 2000 UNITS TABS, Take 2,000 mg by mouth daily.,  Disp: , Rfl:  .  diltiazem (CARDIZEM LA) 360 MG 24 hr tablet, Take 360 mg by mouth daily., Disp: , Rfl:  .  DUREZOL 0.05 % EMUL, Place 1 drop into both eyes daily., Disp: , Rfl: 3 .  fenofibrate (TRICOR) 145 MG tablet, Take 145 mg by mouth daily., Disp: , Rfl:  .  ketorolac (ACULAR) 0.5 % ophthalmic solution, Place 1 drop into both eyes 3 (three) times daily., Disp: , Rfl: 3 .  levothyroxine (SYNTHROID, LEVOTHROID) 50 MCG tablet, Take 50 mcg by mouth daily before breakfast., Disp: , Rfl:  .  magnesium hydroxide (MILK OF MAGNESIA) 800 MG/5ML suspension, Take 15 mLs by mouth daily as needed for constipation., Disp: , Rfl:  .  omeprazole (PRILOSEC) 20 MG capsule, Take 20 mg by mouth daily., Disp: , Rfl:  .  rosuvastatin (CRESTOR) 20 MG tablet, Take 1 tablet (20 mg total) by mouth daily  at 6 PM., Disp: 90 tablet, Rfl: 1   Review of Systems  Constitutional: Negative for fever and unexpected weight change.  HENT: Negative for congestion, dental problem, ear pain, nosebleeds, postnasal drip, rhinorrhea, sinus pressure, sneezing, sore throat and trouble swallowing.   Eyes: Negative for redness and itching.  Respiratory: Positive for shortness of breath. Negative for cough, chest tightness and wheezing.   Cardiovascular: Negative for palpitations and leg swelling.  Gastrointestinal: Negative for nausea and vomiting.  Genitourinary: Negative for dysuria.  Musculoskeletal: Negative for joint swelling.  Skin: Negative for rash.  Allergic/Immunologic: Negative.  Negative for environmental allergies, food allergies and immunocompromised state.  Neurological: Negative for headaches.  Hematological: Does not bruise/bleed easily.  Psychiatric/Behavioral: Negative for dysphoric mood. The patient is not nervous/anxious.        Objective:   Physical Exam  Vitals:   09/04/18 0959  BP: 138/78  Pulse: (!) 57  SpO2: 98%  Weight: 209 lb (94.8 kg)  Height: 5\' 1"  (1.549 m)    Estimated body mass index  is 39.49 kg/m as calculated from the following:   Height as of this encounter: 5\' 1"  (1.549 m).   Weight as of this encounter: 209 lb (94.8 kg).  General Appearance:    Alert, cooperative, no distress, appears stated age - older , sitting on - chair. OBESE. HAS CANE  Head:    Normocephalic, without obvious abnormality, atraumatic  Eyes:    PERRL, conjunctiva/corneas clear,  Ears:    Normal TM's and external ear canals, both ears  Nose:   Nares normal, septum midline, mucosa normal, no drainage    or sinus tenderness. OXYGEN ON no @ ra  Throat:   Lips, mucosa, and tongue normal; teeth and gums normal. Cyanosis on lips - no  Neck:   Supple, symmetrical, trachea midline, no adenopathy;    thyroid:  no enlargement/tenderness/nodules; no carotid   bruit or JVD  Back:     Symmetric, no curvature, ROM normal, no CVA tenderness  Lungs:     Distress - no , Wheeze no, Barrell Chest - no, Purse lip breathing - no, Crackles - no   Chest Wall:    No tenderness or deformity. Scars in chest no   Heart:    Regular rate and rhythm, S1 and S2 normal, no murmur, rub   or gallop  Breast Exam:    NOT DONE  Abdomen:     Soft, non-tender, bowel sounds active all four quadrants,    no masses, no organomegaly  Genitalia:   NOT DONE  Rectal:   NOT DONE  Extremities:   Extremities normal, atraumatic, Clubbing - no, Edema - no  Pulses:   2+ and symmetric all extremities  Skin:   Stigmata of Connective Tissue Disease - no  Lymph nodes:   Cervical, supraclavicular, and axillary nodes normal  Psychiatric:  Neurologic:   plesant CNII-XII intact, normal strength, sensation  throughout            Assessment & Plan:     ICD-10-CM   1. Acute saddle pulmonary embolism with acute cor pulmonale (HCC) I26.02   2. Elevated troponin R79.89   3. 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 Morbid obesity ([facility]) E66.01   5. DOE (dyspnea on exertion) R06.09     Patient Instructions     ICD-10-CM     1. Acute saddle pulmonary embolism with acute cor pulmonale (HCC) I26.02   2. Elevated troponin R79.89   3. Other  secondary pulmonary hypertension (HCC) I27.29   4. Morbid obesity ([facility]) E66.01   5. DOE (dyspnea on exertion) R06.09    Acute saddle pulmonary embolism with acute cor pulmonale (HCC) Elevated troponin  - continue eliquis  - need prolonged treatment for over a year or two as long as there is no bleeding risk  - you are at fall risk and do not fall  - ok to fly - recommend avoiding procedures for 3 months from 08/19/18 to extent possible but if needed hold eliquis for 48h and restart 48h later -Refer to hematology Dr. Beryle Beams the internal medicine teaching service -Do follow-up VQ scan end of December 2019 or early January 2020; indications rule out chronic pulmonary embolism  Other secondary pulmonary hypertension ([facility]) Morbid obesity ([facility])  -Pulmonary hypertension discovered in August 19, 2018 was in part due to the acute pulmonary embolism but you could have chronic pulmonary hypertension related to underlying chronic pulmonary embolism or previous weight loss drug intake 20 years ago or sleep apnea or other causes -When you see Dr. Beryle Beams please get a autoimmune work-up -Do repeat echocardiogram end of December 2019 or early January 2020  DOE (dyspnea on exertion) -Due to all of the above - check ONO on room air - concern for OSA  Follow-up -End of December 2019 early January 2020 but after VQ scan and echocardiogram and seeing Dr. Alanson Aly    Dr. Brand Males, M.D., F.C.C.P,  Pulmonary and Critical Care Medicine Staff Physician, [facility] Director - Interstitial Lung Disease  Program  Pulmonary [facility] at [place], [place], [address]  Pager: [phone], If no answer or between  15:00h - 7:00h: call [phone] Telephone: [phone]  10:39  AM 09/04/2018

## 2018-09-09 ENCOUNTER — Other Ambulatory Visit (HOSPITAL_COMMUNITY): Payer: Medicare HMO

## 2018-09-09 ENCOUNTER — Ambulatory Visit (HOSPITAL_COMMUNITY): Payer: Medicare HMO

## 2018-09-10 DIAGNOSIS — H3581 Retinal edema: Secondary | ICD-10-CM | POA: Diagnosis not present

## 2018-09-15 DIAGNOSIS — H30033 Focal chorioretinal inflammation, peripheral, bilateral: Secondary | ICD-10-CM | POA: Diagnosis not present

## 2018-09-15 DIAGNOSIS — H3581 Retinal edema: Secondary | ICD-10-CM | POA: Diagnosis not present

## 2018-09-15 DIAGNOSIS — Z961 Presence of intraocular lens: Secondary | ICD-10-CM | POA: Diagnosis not present

## 2018-09-21 ENCOUNTER — Telehealth: Payer: Self-pay | Admitting: Internal Medicine

## 2018-09-21 NOTE — Telephone Encounter (Signed)
The order was placed 09/04/18 for pt to be referred to hematology.    Called and spoke with pt who stated she has not heard anything in regards to the referral to hematology nor has she heard anything about the ONO that is supposed to be performed.  PCCs, can you help [place] out with this please.

## 2018-09-21 NOTE — Telephone Encounter (Signed)
[facility] did get the ONO order and tried calling the patient on 09/07/2018 and left message for her to call them back. Myresa stated that she would try to contact the patient again today. As for the appt with Dr. Beryle Beams I called his office and left message for Doris to call me back about the referral waiting for her to return call

## 2018-09-24 NOTE — Telephone Encounter (Signed)
I just called Doris back and she stated that she was having trouble with her voice mail and had not gotten my message from earlier in the week. She states that Dr. Waymon Budge will be retiring sometime next year. She will send a message up to Dr. Waymon Budge about this patient's referral. When he response they will set up the appt or call us back. If Dr. Waymon Budge doesn't see her he will refer her to the [facility]

## 2018-09-24 NOTE — Telephone Encounter (Signed)
Any updates on this Tricia Harrison?

## 2018-10-01 ENCOUNTER — Other Ambulatory Visit: Payer: Self-pay | Admitting: Oncology

## 2018-10-01 DIAGNOSIS — J449 Chronic obstructive pulmonary disease, unspecified: Secondary | ICD-10-CM | POA: Diagnosis not present

## 2018-10-01 DIAGNOSIS — R0902 Hypoxemia: Secondary | ICD-10-CM | POA: Diagnosis not present

## 2018-10-01 DIAGNOSIS — I2609 Other pulmonary embolism with acute cor pulmonale: Secondary | ICD-10-CM

## 2018-10-02 ENCOUNTER — Encounter: Payer: Self-pay | Admitting: Internal Medicine

## 2018-10-08 DIAGNOSIS — Z79899 Other long term (current) drug therapy: Secondary | ICD-10-CM | POA: Diagnosis not present

## 2018-10-08 LAB — BASIC METABOLIC PANEL
BUN/Creatinine Ratio: 22 (ref 12–28)
BUN: 28 mg/dL — ABNORMAL HIGH (ref 8–27)
CALCIUM: 9.5 mg/dL (ref 8.7–10.3)
CHLORIDE: 104 mmol/L (ref 96–106)
CO2: 23 mmol/L (ref 20–29)
Creatinine, Ser: 1.28 mg/dL — ABNORMAL HIGH (ref 0.57–1.00)
GFR calc Af Amer: 48 mL/min/{1.73_m2} — ABNORMAL LOW (ref >59)
GFR calc non Af Amer: 42 mL/min/{1.73_m2} — ABNORMAL LOW (ref >59)
Glucose: 83 mg/dL (ref 65–99)
POTASSIUM: 4 mmol/L (ref 3.5–5.2)
Sodium: 142 mmol/L (ref 134–144)

## 2018-10-08 LAB — CBC
Hematocrit: 33.3 % — ABNORMAL LOW (ref 34.0–46.6)
Hemoglobin: 11.5 g/dL (ref 11.1–15.9)
MCH: 30.2 pg (ref 26.6–33.0)
MCHC: 34.5 g/dL (ref 31.5–35.7)
MCV: 87 fL (ref 79–97)
PLATELETS: 274 10*3/uL (ref 150–450)
RBC: 3.81 x10E6/uL (ref 3.77–5.28)
RDW: 13.7 % (ref 12.3–15.4)
WBC: 12 10*3/uL — AB (ref 3.4–10.8)

## 2018-10-08 LAB — HEPATIC FUNCTION PANEL
ALT: 8 IU/L (ref 0–32)
AST: 9 IU/L (ref 0–40)
Albumin: 4 g/dL (ref 3.5–4.8)
Alkaline Phosphatase: 36 IU/L — ABNORMAL LOW (ref 39–117)
BILIRUBIN TOTAL: 0.4 mg/dL (ref 0.0–1.2)
Bilirubin, Direct: 0.14 mg/dL (ref 0.00–0.40)
Total Protein: 5.9 g/dL — ABNORMAL LOW (ref 6.0–8.5)

## 2018-10-08 LAB — LIPID PANEL
CHOL/HDL RATIO: 2.3 ratio (ref 0.0–4.4)
Cholesterol, Total: 190 mg/dL (ref 100–199)
HDL: 84 mg/dL (ref >39)
LDL CALC: 92 mg/dL (ref 0–99)
TRIGLYCERIDES: 71 mg/dL (ref 0–149)
VLDL Cholesterol Cal: 14 mg/dL (ref 5–40)

## 2018-10-13 DIAGNOSIS — H30033 Focal chorioretinal inflammation, peripheral, bilateral: Secondary | ICD-10-CM | POA: Diagnosis not present

## 2018-10-13 DIAGNOSIS — H3581 Retinal edema: Secondary | ICD-10-CM | POA: Diagnosis not present

## 2018-10-13 DIAGNOSIS — Z961 Presence of intraocular lens: Secondary | ICD-10-CM | POA: Diagnosis not present

## 2018-10-19 ENCOUNTER — Telehealth: Payer: Self-pay | Admitting: Internal Medicine

## 2018-10-19 NOTE — Telephone Encounter (Signed)
ono ra at night < 88% at 6 sec.  Do not recommend o2 at night

## 2018-10-21 NOTE — Telephone Encounter (Signed)
Attempted to call pt but no answer. Left message for pt to return call. 

## 2018-10-26 NOTE — Telephone Encounter (Signed)
Patient returned phone call; pt contact # [phone]

## 2018-10-26 NOTE — Telephone Encounter (Signed)
Was able to talk to patient regarding patient's ONO results.  They verbalized an understanding of what was discussed. No further questions at this time. Nothing further at this time.

## 2018-10-28 DIAGNOSIS — K219 Gastro-esophageal reflux disease without esophagitis: Secondary | ICD-10-CM | POA: Diagnosis not present

## 2018-10-28 DIAGNOSIS — I10 Essential (primary) hypertension: Secondary | ICD-10-CM | POA: Diagnosis not present

## 2018-10-28 DIAGNOSIS — Z0001 Encounter for general adult medical examination with abnormal findings: Secondary | ICD-10-CM | POA: Diagnosis not present

## 2018-10-28 DIAGNOSIS — N183 Chronic kidney disease, stage 3 (moderate): Secondary | ICD-10-CM | POA: Diagnosis not present

## 2018-10-28 DIAGNOSIS — E041 Nontoxic single thyroid nodule: Secondary | ICD-10-CM | POA: Diagnosis not present

## 2018-10-28 DIAGNOSIS — E119 Type 2 diabetes mellitus without complications: Secondary | ICD-10-CM | POA: Diagnosis not present

## 2018-10-28 DIAGNOSIS — E039 Hypothyroidism, unspecified: Secondary | ICD-10-CM | POA: Diagnosis not present

## 2018-10-28 DIAGNOSIS — E7800 Pure hypercholesterolemia, unspecified: Secondary | ICD-10-CM | POA: Diagnosis not present

## 2018-11-09 ENCOUNTER — Other Ambulatory Visit: Payer: Self-pay

## 2018-11-09 ENCOUNTER — Encounter: Payer: Self-pay | Admitting: Oncology

## 2018-11-09 ENCOUNTER — Ambulatory Visit: Payer: Medicare HMO | Admitting: Oncology

## 2018-11-09 VITALS — BP 160/71 | HR 70 | Temp 98.3°F | Ht 61.0 in | Wt 211.7 lb

## 2018-11-09 DIAGNOSIS — Z888 Allergy status to other drugs, medicaments and biological substances status: Secondary | ICD-10-CM

## 2018-11-09 DIAGNOSIS — Z7901 Long term (current) use of anticoagulants: Secondary | ICD-10-CM

## 2018-11-09 DIAGNOSIS — K635 Polyp of colon: Secondary | ICD-10-CM

## 2018-11-09 DIAGNOSIS — Z87891 Personal history of nicotine dependence: Secondary | ICD-10-CM

## 2018-11-09 DIAGNOSIS — Z90710 Acquired absence of both cervix and uterus: Secondary | ICD-10-CM

## 2018-11-09 DIAGNOSIS — Z96652 Presence of left artificial knee joint: Secondary | ICD-10-CM

## 2018-11-09 DIAGNOSIS — I2724 Chronic thromboembolic pulmonary hypertension: Secondary | ICD-10-CM

## 2018-11-09 DIAGNOSIS — E663 Overweight: Secondary | ICD-10-CM | POA: Diagnosis not present

## 2018-11-09 DIAGNOSIS — Z96642 Presence of left artificial hip joint: Secondary | ICD-10-CM

## 2018-11-09 DIAGNOSIS — G8929 Other chronic pain: Secondary | ICD-10-CM | POA: Diagnosis not present

## 2018-11-09 DIAGNOSIS — M4800 Spinal stenosis, site unspecified: Secondary | ICD-10-CM | POA: Diagnosis not present

## 2018-11-09 DIAGNOSIS — N182 Chronic kidney disease, stage 2 (mild): Secondary | ICD-10-CM

## 2018-11-09 DIAGNOSIS — Z86718 Personal history of other venous thrombosis and embolism: Secondary | ICD-10-CM

## 2018-11-09 DIAGNOSIS — Z6841 Body Mass Index (BMI) 40.0 and over, adult: Secondary | ICD-10-CM | POA: Diagnosis not present

## 2018-11-09 DIAGNOSIS — I252 Old myocardial infarction: Secondary | ICD-10-CM | POA: Diagnosis not present

## 2018-11-09 DIAGNOSIS — I2699 Other pulmonary embolism without acute cor pulmonale: Secondary | ICD-10-CM

## 2018-11-09 DIAGNOSIS — I272 Pulmonary hypertension, unspecified: Secondary | ICD-10-CM

## 2018-11-09 DIAGNOSIS — I2602 Saddle embolus of pulmonary artery with acute cor pulmonale: Secondary | ICD-10-CM

## 2018-11-09 DIAGNOSIS — Z79899 Other long term (current) drug therapy: Secondary | ICD-10-CM

## 2018-11-09 DIAGNOSIS — Z885 Allergy status to narcotic agent status: Secondary | ICD-10-CM

## 2018-11-09 LAB — D-DIMER, QUANTITATIVE (NOT AT ARMC): D DIMER QUANT: 0.55 ug{FEU}/mL — AB (ref 0.00–0.50)

## 2018-11-09 NOTE — Patient Instructions (Signed)
To lab today Return as needed - if you need to see a Hematologist in the future, your family doctor can make the appropriate arrangements. Stay on your Eliquis for now

## 2018-11-09 NOTE — Consult Note (Signed)
New Patient Hematology   Tricia Harrison [id number] [birth year] 72 y.o. 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  In 2016, she had her left knee replaced.  She had had a previous left hip replacement in [place].  Ever since the knee surgery she has had swelling of her calf.  She also developed the indolent onset of dyspnea.  She had a negative cardiology evaluation.  She was told it was her weight and that she needed to exercise and lose weight.  On August 17, 2018, she developed significant acute dyspnea just walking across the room and chest pressure.  She took anti-reflux medication with some initial relief.  However the next morning symptoms recurred and were worse.  She presented to the hospital.  Oxygen saturation in the 92-96% range.  Pulse 70.  Blood pressure 149/76.  Initial troponin elevated 0.18.  Peaked at 0.27. EKG with nonspecific T wave abnormalities related to left ventricular hypertrophy.  No comparison tracing.  She was initially felt to have a non-ST wave elevation MI.  An echocardiogram showed significant pulmonary hypertension with no clear reason.  Normal left ventricular ejection fraction.  Grade 1 diastolic dysfunction.  She underwent cardiac cath which showed nonobstructive disease in the coronaries but confirmed increased pulmonary artery pressure.  At that point a CT angiogram of the chest was done on September 26 and showed bilateral moderate volume pulmonary emboli with signs of right heart strain, increased RV LV ratio.  She was already on anticoagulation when she was initially thought to have an acute coronary syndrome.  She was transitioned to Eliquis. Doppler studies of the lower extremities were done on 9/27 and showed age  indeterminate DVT in the left femoral and left profunda veins.  She has chronic swelling and discomfort of the left lower extremity but had not noted any recent acute pain or swelling. She has a number of personal risk factors for thrombosis including her age, sedentary lifestyle, weight, and chronic spinal stenosis with chronic pain.  She tells me she was much more active and going to the gym until she developed the indolent dyspnea over the last 4 years. Of interest is recent diagnosis of bilateral uveitis with an extensive evaluation at [facility] by Dr. Acey Lav.  Every serologic test known to man was checked and including ANA, rheumatoid factor, anti-DNA, c-ANCA, p-ANCA, MPO, and MO, Lyme titers, toxoplasmosis titer, RPR, hepatitis A, B, C, HIV, QuantiFERON gold, SPEP, and ALT were normal.HLA B-51 was positive.  She has mild, chronic, renal insufficiency with baseline creatinine 1.3.  Normal liver functions.  No signs or symptoms of a collagen vascular disorder.  No family history of collagen vascular disorder or blood clots.  Strong family history of cancer. She keeps up-to-date on her health maintenance exams which have been repeatedly unremarkable.  She has colon polyps with a surveillance colonoscopy within the last year, she gets annual mammograms, she gets annual pelvic exams in view of history of cystic ovaries and strong family history of uterine cancer on her mother side of the family.  She had a hysterectomy at age 75.  Ovaries left in place.  She was put on azathioprine and prednisone for her uveitis and has made significant progress although she still has some blurring.  Azathioprine is being tapered.  Prednisone was tapered and recently stopped.   PMH: Past Medical History:  Diagnosis Date  . Anemia    as a child  . Asthma    as a child  . Chronic back pain    spinal stenosis and buldging disc;scoliosis  . Chronic constipation    occasionally takes  something OTC  . Diabetes mellitus without complication ([place])    per pt "Pre" for 20+yrs  . Diverticulosis   . Diverticulosis   . Dysphagia   . Gallstones    has known about this for 3-60yr  . GERD (gastroesophageal reflux disease)    takes Omeprazole daily  . H/O hiatal hernia   . Hemorrhoids   . History of blood transfusion    no abnormal reaction noted  . History of bronchitis    states its been a long time ago  . History of gout   . HLD (hyperlipidemia)    takes Fenofibrate daily  . HTN (hypertension)    takes Diltiazem and Ramipril daily  . Hx of gallstones   . Hypothyroidism    takes Synthroid daily  . Joint pain   . Joint swelling   . Left knee DJD   . Nocturia   . NSTEMI (non-ST elevated myocardial infarction) ([facility] 08/18/2018  . Palpitations    started in 2013 and only occasionally;last time noticed about 2-3wks ago and after drinking caffeine  . PONV (postoperative nausea and vomiting)   She is prediabetic on no medications.  No history of hepatitis, yellow jaundice, mononucleosis.  No known history of lupus or rheumatoid arthritis.  Past Surgical History:  Procedure Laterality Date  . COLONOSCOPY: Known benign colon polyps.    .Marland KitchenDILATION AND CURETTAGE OF UTERUS     multiple about 6-7 times  . ESOPHAGOGASTRODUODENOSCOPY    . RIGHT/LEFT HEART CATH AND CORONARY ANGIOGRAPHY N/A 08/19/2018   Procedure: RIGHT/LEFT HEART CATH AND CORONARY ANGIOGRAPHY;  Surgeon: VJettie Booze MD;  [facility];  Service: Cardiovascular;  Laterality: N/A;  . THYROID SURGERY: Status post right hemithyroidectomy for multinodular goiter.  Pathology benign per her history. Right 2010  . TOTAL HIP ARTHROPLASTY Left 2005  . TOTAL KNEE ARTHROPLASTY 2016. Left 02/21/2014   Procedure: TOTAL KNEE ARTHROPLASTY;  Surgeon: RLorn Junes MD;  [facility]  Service: Orthopedics;  Laterality: Left;  .Marland KitchenVAGINAL HYSTERECTOMY age 72  1979    Allergies: Allergies  Allergen  Reactions  . Atorvastatin Other (See Comments)    Muscle pain  . Codeine Nausea And Vomiting  . Morphine And Related     B/p drops     Medications:  Current Outpatient Medications:  .  acetaminophen (TYLENOL) 500 MG tablet, Take 1,000 mg by mouth every 6 (six) hours as needed for mild pain., Disp: , Rfl:  .  albuterol (PROVENTIL HFA;VENTOLIN HFA) 108 (90 Base) MCG/ACT inhaler, Inhale 2 puffs into the lungs every 6 (six) hours as needed., Disp: , Rfl: 1 .  apixaban (ELIQUIS) 5 MG TABS tablet, Take 1 tablet (5 mg total) by mouth 2 (two) times daily., Disp: 180 tablet, Rfl: 1 .  bisacodyl (DULCOLAX) 5 MG EC tablet, Take 2 tablets every night with dinner until bowel movement.  LAXITIVE.  Restart if two days since last bowel movement, Disp: 30 tablet, Rfl: 0 .  carvedilol (COREG) 6.25 MG tablet, Take 1 tablet (6.25 mg total) by mouth 2 (two) times daily with a meal., Disp: 180 tablet, Rfl: 1 .  Cholecalciferol (VITAMIN D3) 2000 UNITS TABS, Take 2,000 mg by mouth daily., Disp: , Rfl:  .  diltiazem (CARDIZEM LA) 360 MG 24 hr tablet, Take 360 mg by mouth daily., Disp: , Rfl:  .  DUREZOL 0.05 % EMUL, Place 1 drop into both eyes daily., Disp: , Rfl: 3 .  fenofibrate (TRICOR) 145 MG tablet, Take 145 mg by mouth daily., Disp: , Rfl:  .  ketorolac (ACULAR) 0.5 % ophthalmic solution, Place 1 drop into both eyes 3 (three) times daily., Disp: , Rfl: 3 .  levothyroxine (SYNTHROID, LEVOTHROID) 50 MCG tablet, Take 50 mcg by mouth daily before breakfast., Disp: , Rfl:  .  magnesium hydroxide (MILK OF MAGNESIA) 800 MG/5ML suspension, Take 15 mLs by mouth daily as needed for constipation., Disp: , Rfl:  .  omeprazole (PRILOSEC) 20 MG capsule, Take 20 mg by mouth daily., Disp: , Rfl:  .  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  Divorced.  She has 1 son aged 2  recently completed chemotherapy and radiation for rectal cancer.  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  she quit smoking about 24 years ago. She reports current alcohol use. She does not use drugs.  Family History: Family History  Problem Relation Age of Onset  . Uterine cancer Mother   . Heart disease Mother   . Hypertension Mother   . Diabetes Mother   . Cirrhosis Father   . Pneumonia Father   . Lymphoma Sister        ,non hodgkin   . Diabetes Sister   Mother died of an MI at age 85.  Also had uterine cancer.  Father died of complications of pneumonia and cirrhosis at age 21.  Maternal aunt had uterine cancer.   Review of Systems: See HPI Remaining ROS negative.  Physical Exam: Blood pressure (!) 160/71, pulse 70, temperature 98.3 F (36.8 C), temperature source Oral, height '5\' 1"'  (1.549 m), weight 211 lb 11.2 oz (96 kg), SpO2 100 %. Wt Readings from Last 3 Encounters:  11/09/18 211 lb 11.2 oz (96 kg)  09/04/18 209 lb (94.8 kg)  08/27/18 209 lb (94.8 kg)     General appearance: Overweight African-American woman.  BMI 40. HENNT: Pharynx no erythema, exudate, mass, or ulcer. No thyromegaly or thyroid nodules Lymph nodes: No cervical, supraclavicular, or axillary lymphadenopathy Breasts:  Lungs: Clear to auscultation, resonant to percussion throughout Heart: Regular rhythm, no murmur, no gallop, no rub, no click, no edema Abdomen: Soft, nontender, normal bowel sounds, no mass, no organomegaly Extremities: No edema, no calf tenderness.  Increased soft tissue swelling in the pretibial region from the knee down to mid femur. Calf measurements: 47 cm on the left, 43 on the right Ankle measurements: 24 cm on the left, 23.5 on the right Musculoskeletal: no joint deformities GU: Vascular: Carotid pulses 2+, no bruits, distal  pulses: Neurologic: Alert, oriented, PERRLA, optic discs sharp and vessels normal, no hemorrhage or exudate, cranial nerves grossly normal, motor strength 5 over 5, reflexes 1+ symmetric, upper body coordination normal, gait normal, Skin: No rash or ecchymosis    Lab Results: Lab Results  Component Value Date   WBC 12.0 (H) 10/08/2018   HGB 11.5  10/08/2018   HCT 33.3 (L) 10/08/2018   MCV 87 10/08/2018   PLT 274 10/08/2018     Chemistry      Component Value Date/Time   NA 142 10/08/2018 0929   K 4.0 10/08/2018 0929   CL 104 10/08/2018 0929   CO2 23 10/08/2018 0929   BUN 28 (H) 10/08/2018 0929   CREATININE 1.28 (H) 10/08/2018 0929      Component Value Date/Time   CALCIUM 9.5 10/08/2018 0929   ALKPHOS 36 (L) 10/08/2018 0929   AST 9 10/08/2018 0929   ALT 8 10/08/2018 0929   BILITOT 0.4 10/08/2018 7519      Radiological Studies: See discussion above in the HPI    Impression: My clinical impression is that 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  She then developed indolent onset of dyspnea recently progressive.  I think that she has been having low volume pulmonary emboli intermittently over the last 4 years. Regardless of the etiology of her pulmonary emboli, and my supposition as to its origin, the most recent event was clearly unprovoked and she has multiple fixed risk factors including her age, weight, sedentary life style which put her at continued risk for new events. There is no reason to get an extended panel of special hematology labs since will not change her management.  I will, however, screen her for the presence of antiphospholipid antibodies especially in view of the idiopathic uveitis.  There may be a practical reason for this.  We are learning that the new oral anticoagulants give insufficient protection against arterial embolic events  and if she does test positive for the antiphospholipid antibodies I would recommend changing her from Eliquis to warfarin. Unfortunately, the most specific test correlated with thrombosis risk in the setting of antiphospholipid antibody syndrome is the lupus anticoagulant.  This is a PTT based test that cannot be tested reliably when somebody is on a direct thrombin inhibitor or a Xa inhibitor.     Recommendation: Chronic full dose anticoagulation. See discussion above.    Murriel Hopper, MD, [place]  Hematology-Oncology/Internal Medicine  11/09/2018, 1:48 PM

## 2018-11-11 ENCOUNTER — Telehealth: Payer: Self-pay | Admitting: *Deleted

## 2018-11-11 LAB — CARDIOLIPIN ANTIBODIES, IGG, IGM, IGA
Anticardiolipin IgA: <9 APL U/mL (ref 0–11)
Anticardiolipin IgG: <9 GPL U/mL (ref 0–14)
Anticardiolipin IgM: <9 MPL U/mL (ref 0–12)

## 2018-11-11 LAB — BETA-2-GLYCOPROTEIN I ABS, IGG/M/A: Beta-2 Glyco I IgG: <9 GPI IgG units (ref 0–20)

## 2018-11-11 NOTE — Telephone Encounter (Signed)
Called pt - no answer; left message to call the office . 

## 2018-11-11 NOTE — Telephone Encounter (Signed)
-----   Message from Annia Belt, MD sent at 11/11/2018  9:31 AM EST ----- Call pt: no antibodies detected against your clotting factors. Stay on Xarelto.

## 2018-11-11 NOTE — Telephone Encounter (Signed)
-----   Message from Annia Belt, MD sent at 11/11/2018  9:36 AM EST ----- See last email: she is on Eliquis, not Xarelto; my mistake

## 2018-11-12 NOTE — Telephone Encounter (Signed)
Pt called / informed "no antibodies detected against your clotting factors. Stay on Eliquis" per Dr Beryle Beams. Voice understanding.

## 2018-11-16 ENCOUNTER — Ambulatory Visit (HOSPITAL_COMMUNITY): Payer: Medicare HMO | Attending: Cardiovascular Disease

## 2018-11-16 ENCOUNTER — Other Ambulatory Visit: Payer: Self-pay

## 2018-11-16 DIAGNOSIS — I2729 Other secondary pulmonary hypertension: Secondary | ICD-10-CM | POA: Insufficient documentation

## 2018-11-16 HISTORY — PX: TRANSTHORACIC ECHOCARDIOGRAM: SHX275

## 2018-11-23 ENCOUNTER — Encounter (HOSPITAL_COMMUNITY)
Admission: RE | Admit: 2018-11-23 | Discharge: 2018-11-23 | Disposition: A | Discharge status: Home / Self Care | Payer: Medicare HMO | Source: Ambulatory Visit | Attending: Internal Medicine | Admitting: Internal Medicine

## 2018-11-23 ENCOUNTER — Ambulatory Visit (HOSPITAL_COMMUNITY)
Admission: RE | Admit: 2018-11-23 | Discharge: 2018-11-23 | Disposition: A | Discharge status: Home / Self Care | Payer: Medicare HMO | Source: Ambulatory Visit | Attending: Internal Medicine | Admitting: Internal Medicine

## 2018-11-23 DIAGNOSIS — I2602 Saddle embolus of pulmonary artery with acute cor pulmonale: Secondary | ICD-10-CM | POA: Insufficient documentation

## 2018-11-23 DIAGNOSIS — R778 Other specified abnormalities of plasma proteins: Secondary | ICD-10-CM

## 2018-11-23 DIAGNOSIS — R7989 Other specified abnormal findings of blood chemistry: Secondary | ICD-10-CM | POA: Diagnosis not present

## 2018-11-23 DIAGNOSIS — Z825 Family history of asthma and other chronic lower respiratory diseases: Secondary | ICD-10-CM | POA: Diagnosis not present

## 2018-11-23 HISTORY — PX: NM VQ LUNG SCAN (ARMC HX): HXRAD1205

## 2018-11-23 MED ORDER — TECHNETIUM TO 99M ALBUMIN AGGREGATED
4.0000 | Freq: Once | INTRAVENOUS | Status: AC | PRN
  Administered 2018-11-23: 4 via INTRAVENOUS

## 2018-11-23 MED ORDER — TECHNETIUM TC 99M DIETHYLENETRIAME-PENTAACETIC ACID
30.0000 | Freq: Once | INTRAVENOUS | Status: AC | PRN
  Administered 2018-11-23: 30 via INTRAVENOUS

## 2018-11-24 DIAGNOSIS — Z961 Presence of intraocular lens: Secondary | ICD-10-CM | POA: Diagnosis not present

## 2018-11-24 DIAGNOSIS — H30033 Focal chorioretinal inflammation, peripheral, bilateral: Secondary | ICD-10-CM | POA: Diagnosis not present

## 2018-11-24 DIAGNOSIS — H3581 Retinal edema: Secondary | ICD-10-CM | POA: Diagnosis not present

## 2018-11-30 ENCOUNTER — Telehealth: Payer: Self-pay | Admitting: Internal Medicine

## 2018-11-30 NOTE — Telephone Encounter (Signed)
Called and spoke with Patient.  She stated that she was able to see her echo and VQ results in my chart, but she does not understand them.  Results are not resulted at this time.  She stated that she has a appointment with MR on Thursday, 12/03/18, and would like him to review results at that time.    Will route to Dr. Chase Caller and Raquel Sarna as Juluis Rainier

## 2018-12-01 NOTE — Telephone Encounter (Signed)
Called and spoke with patient advised her of response below. Patient verbalized understanding. Nothing further needed.

## 2018-12-01 NOTE — Telephone Encounter (Signed)
Nothing urgent in those reports. Best explained face to face. Telephone explanation through intermediary will not work for this. I am seeing her 12/03/2018 and will explain at that time

## 2018-12-02 ENCOUNTER — Encounter: Payer: Self-pay | Admitting: Cardiology

## 2018-12-02 ENCOUNTER — Ambulatory Visit: Payer: Medicare HMO | Admitting: Cardiology

## 2018-12-02 VITALS — BP 134/74 | HR 62 | Ht 61.0 in | Wt 209.4 lb

## 2018-12-02 DIAGNOSIS — I2729 Other secondary pulmonary hypertension: Secondary | ICD-10-CM | POA: Diagnosis not present

## 2018-12-02 DIAGNOSIS — I351 Nonrheumatic aortic (valve) insufficiency: Secondary | ICD-10-CM | POA: Diagnosis not present

## 2018-12-02 DIAGNOSIS — I2602 Saddle embolus of pulmonary artery with acute cor pulmonale: Secondary | ICD-10-CM | POA: Diagnosis not present

## 2018-12-02 DIAGNOSIS — E785 Hyperlipidemia, unspecified: Secondary | ICD-10-CM | POA: Diagnosis not present

## 2018-12-02 DIAGNOSIS — R0609 Other forms of dyspnea: Secondary | ICD-10-CM | POA: Diagnosis not present

## 2018-12-02 DIAGNOSIS — I2699 Other pulmonary embolism without acute cor pulmonale: Secondary | ICD-10-CM | POA: Diagnosis not present

## 2018-12-02 DIAGNOSIS — I10 Essential (primary) hypertension: Secondary | ICD-10-CM

## 2018-12-02 DIAGNOSIS — I2782 Chronic pulmonary embolism: Secondary | ICD-10-CM

## 2018-12-02 MED ORDER — APIXABAN 5 MG PO TABS: 5.0000 mg | ORAL_TABLET | Freq: Two times a day (BID) | ORAL | 3 refills | Status: DC

## 2018-12-02 NOTE — Patient Instructions (Addendum)
Medication Instructions:   CONTINUE ALL CURRENT MEDICATIONS  If you need a refill on your cardiac medications before your next appointment, please call your pharmacy.   Lab work:  NOT NEEDED  If you have labs (blood work) drawn today and your tests are completely normal, you will receive your results only by: Marland Kitchen MyChart Message (if you have MyChart) OR . A paper copy in the mail If you have any lab test that is abnormal or we need to change your treatment, we will call you to review the results.  Testing/Procedures: SCHEDULE AT [facility] Your physician has requested that you have an echocardiogram. Echocardiography is a painless test that uses sound waves to create images of your heart. It provides your doctor with information about the size and shape of your heart and how well your heart's chambers and valves are working. This procedure takes approximately one hour. There are no restrictions for this procedure.    Follow-Up: At [facility], you and your health needs are our priority.  As part of our continuing mission to provide you with exceptional heart care, we have created designated Provider Care Teams.  These Care Teams include your primary Cardiologist (physician) and Advanced Practice Providers (APPs -  Physician Assistants and Nurse Practitioners) who all work together to provide you with the care you need, when you need it. You will need a follow up appointment in 12 months--Jan 2021.  Please call our office 2 months in advance to schedule this appointment.  You may see Glenetta Hew, MD or one of the following Advanced Practice Providers on your designated Care Team:   Rosaria Ferries, PA-C . Jory Sims, DNP, ANP  Any Other Special Instructions Will Be Listed Below (If Applicable).  --- CONTINUE EXERCISING --- CONTINUE SEEING DR [id number] --- CONTINUE Boris Lown

## 2018-12-02 NOTE — Progress Notes (Signed)
PCP: Lujean Amel, MD  Clinic Note: Chief Complaint  Patient presents with  . Follow-up    Had several tests ordered by pulmonary medicine.  . Shortness of Breath    Notably improved    HPI: Tricia Harrison is a 73 y.o. female with a PMH below who presents today for 3 month f/u for PE-Pulm HTN.  Tricia Harrison was last seen on 08/27/2018 by Jory Sims, NP for hospital follow-up after submassive PE with acute cor Pulmonale & + Troponin.  She had multiple questions but denied any recurrent chest tightness/heaviness or pressure.  Breathing status notably improved.  But very limited as far as activity goes because of back pain.  No bleeding issues.  No medication changes.  CBC, CMP, lipids ordered.  Recent Hospitalizations:   Nov 2019 - + Troponin from Bilateral (submassive) PE with Pulm HTN. --Eliquis. Non-obstructive CAD on cath.   Studies Personally Reviewed - (if available, images/films reviewed: From Epic Chart or Care Everywhere)  TTE September 25th 2019: (In setting of acute PE) mild LVH.  EF 55 to 60%.  GR 1 DD.  Aortic sclerosis but no stenosis.  Mild regurgitation.  Mild RA dilation.  Moderate LA dilation.  Moderate tricuspid regurgitation with severe primary hypertension estimated PA pressure 71 mmHg.  RV poorly visualized  Right Left Heart Catheterization October 19, 2018: Nonobstructive CAD.  Mild pulmonary pretension.  Mean PA pressure 26 mmHg with PA P 48/20 mmHg.  Normal LVEDP.  CT ANGIOGRAM OF CHEST 08/20/2018 : Bilateral nonocclusive pulmonary emboli evidence of right heart strain consistent with "submassive "/intermediate PE.  -->  Code PE called.  TTE November 16, 2018: EF 60-65%.  GR 1 DD.  Focal basal hypertrophy.  Moderate aortic regurgitation.  No stenosis.  Mild LA dilation.  Normal RV size and function.  Moderate PI but mild TR.  Peak PA pressure is now estimated 38 mmHg.  VQ scan 11/23/2018: Low probability for PE.  Interval History: Tricia Harrison  returns today overall doing fairly well.  She still has some exertional dyspnea but notes that her breathing is notably improved.  She had an upper respiratory tract issue going on back in December but that seems to have cleared up.  Intermittently wheezing but not significant now.  Coughing has improved.  She denies any PND, orthopnea or edema -however she does have dependent edema if she sits for long period time, notably on trips.  She also has chronic swelling of her left knee since her surgery. She has not had any further sense of tightness or pressure in her chest consistent with her initial presentation. She has been troubled with uveitis and is having some eye issues which cause her to feel lightheaded and dizzy is on occasion, but no real syncope or near syncope type symptoms.  No TIA or amaurosis fugax symptoms.  No rapid irregular heartbeats palpitations.  No melena, hematochezia, hematuria, or epstaxis. No claudication.  ROS: A comprehensive was performed. Review of Systems  Constitutional: Negative for chills, fever and malaise/fatigue (Just is not very active due to back pain).  HENT: Negative for congestion and nosebleeds.   Eyes: Positive for redness.       Eyes are itchy and blurry on occasion, related to her uveitis.  Is currently now on a new pill not listed on her med list.  Respiratory: Positive for shortness of breath (Improved, but now back to premorbid baseline) and wheezing (Off and on.  Has an inhaler.). Negative for cough (Rarely).  Gastrointestinal: Negative for blood in stool, heartburn and melena.  Genitourinary: Negative for frequency and hematuria.  Musculoskeletal: Positive for back pain and joint pain (Left knee pain and swelling).  Neurological: Positive for headaches (Only when her eyes are acting up). Negative for dizziness, focal weakness, loss of consciousness and weakness.  Psychiatric/Behavioral: Negative for depression and memory loss. The patient is not  nervous/anxious and does not have insomnia.   All other systems reviewed and are negative.   I have reviewed and (if needed) personally updated the patient's problem list, medications, allergies, past medical and surgical history, social and family history.   Past Medical History:  Diagnosis Date  . Anemia    as a child  . Asthma    as a child  . Chronic back pain    spinal stenosis and buldging disc;scoliosis  . Chronic constipation    occasionally takes something OTC  . Diabetes mellitus without complication ([place])    per pt "Pre" for 20+yrs  . Diverticulosis   . Gallstones    has known about this for 3-21yrs  . GERD (gastroesophageal reflux disease)    takes Omeprazole daily  . H/O hiatal hernia   . Hemorrhoids   . History of blood transfusion    no abnormal reaction noted  . History of bronchitis    states its been a long time ago  . History of gout   . HLD (hyperlipidemia)    takes Fenofibrate daily  . HTN (hypertension)    takes Diltiazem and Ramipril daily  . Hypothyroidism    takes Synthroid daily  . Left knee DJD   . Nocturia   . Palpitations    started in 2013 and only occasionally;last time noticed about 2-3wks ago and after drinking caffeine  . PONV (postoperative nausea and vomiting)   . 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    Past Surgical History:  Procedure Laterality Date  . COLONOSCOPY    . CT ANGIOGRAM OF CHEST - PE PROTOCOL  07/2018   Bilateral nonocclusive pulmonary emboli evidence of right heart strain consistent with "submassive "/intermediate PE.  -->  Code PE called.  Marland Kitchen DILATION AND CURETTAGE OF UTERUS     multiple about 6-7 times  . ESOPHAGOGASTRODUODENOSCOPY    . NM VQ LUNG SCAN ([facility] HX)  11/23/2018   Low probability for PE  . RIGHT/LEFT HEART CATH AND CORONARY ANGIOGRAPHY N/A 08/19/2018   Procedure: RIGHT/LEFT  HEART CATH AND CORONARY ANGIOGRAPHY;  Surgeon: Jettie Booze, MD;  Location: [facility];  Service: Cardiovascular:: Nonobstructive CAD.  Mild pulmonary pretension.  Mean PA pressure 26 mmHg with PA P 48/20 mmHg.  Normal LVEDP.  . THYROID SURGERY Right 2010  . TOTAL HIP ARTHROPLASTY Left 2005  . TOTAL KNEE ARTHROPLASTY Left 02/21/2014   Procedure: TOTAL KNEE ARTHROPLASTY;  Surgeon: Lorn Junes, MD;  Location: [facility];  Service: Orthopedics;  Laterality: Left;  . TRANSTHORACIC ECHOCARDIOGRAM    . TRANSTHORACIC ECHOCARDIOGRAM  07/2018    (In setting of acute PE) mild LVH.  EF 55 to 60%.  GR 1 DD.  Aortic sclerosis but no stenosis.  Mild regurgitation.  Mild RA dilation.  Moderate LA dilation.  Moderate tricuspid regurgitation with severe primary hypertension estimated PA pressure 71 mmHg.  RV poorly visualized  . TRANSTHORACIC ECHOCARDIOGRAM  11/16/2018   EF 60-65%.  GR 1 DD.  Focal basal hypertrophy.  Moderate aortic regurgitation.  No stenosis.  Mild LA dilation.  Normal RV size and function.  Moderate PI but mild TR.  Peak PA pressure is now estimated 38 mmHg.  Marland Kitchen VAGINAL HYSTERECTOMY  1979    Current Meds  Medication Sig  . acetaminophen (TYLENOL) 500 MG tablet Take 1,000 mg by mouth every 6 (six) hours as needed for mild pain.  Marland Kitchen albuterol (PROVENTIL HFA;VENTOLIN HFA) 108 (90 Base) MCG/ACT inhaler Inhale 2 puffs into the lungs every 6 (six) hours as needed.  Marland Kitchen apixaban (ELIQUIS) 5 MG TABS tablet Take 1 tablet (5 mg total) by mouth 2 (two) times daily.  . bisacodyl (DULCOLAX) 5 MG EC tablet Take 2 tablets every night with dinner until bowel movement.  LAXITIVE.  Restart if two days since last bowel movement  . carvedilol (COREG) 6.25 MG tablet Take 1 tablet (6.25 mg total) by mouth 2 (two) times daily with a meal.  . Cholecalciferol (VITAMIN D3) 2000 UNITS TABS Take 2,000 mg by mouth daily.  Marland Kitchen diltiazem (CARDIZEM LA) 360 MG 24 hr tablet Take 360 mg by mouth daily.  . DUREZOL  0.05 % EMUL Place 1 drop into both eyes daily.  . fenofibrate (TRICOR) 145 MG tablet Take 145 mg by mouth daily.  Marland Kitchen ketorolac (ACULAR) 0.5 % ophthalmic solution Place 1 drop into both eyes 3 (three) times daily.  Marland Kitchen levothyroxine (SYNTHROID, LEVOTHROID) 50 MCG tablet Take 50 mcg by mouth daily before breakfast.  . magnesium hydroxide (MILK OF MAGNESIA) 800 MG/5ML suspension Take 15 mLs by mouth daily as needed for constipation.  Marland Kitchen omeprazole (PRILOSEC) 20 MG capsule Take 20 mg by mouth daily.  . rosuvastatin (CRESTOR) 20 MG tablet Take 1 tablet (20 mg total) by mouth daily at 6 PM.  . [DISCONTINUED] apixaban (ELIQUIS) 5 MG TABS tablet Take 1 tablet (5 mg total) by mouth 2 (two) times daily.    Allergies  Allergen Reactions  . Atorvastatin Other (See Comments)    Muscle pain  . Codeine Nausea And Vomiting  . Morphine And Related     B/p drops     Social History   Tobacco Use  . Smoking status: Former Smoker    Packs/day: 0.50    Years: 20.00    Pack years: 10.00    Types: Cigarettes    Start date: 09/04/1964    Last attempt to quit: 11/25/1993    Years since quitting: 25.0  . Smokeless tobacco: Never Used  . Tobacco comment: quit smoking in 1995  Substance Use Topics  . Alcohol use: Yes    Comment: Socially.  . Drug use: No   Social History   Social History Narrative   Pt lives w/ sister and nephew.    family history includes Cirrhosis in her father; Diabetes in her mother and sister; Heart disease in her mother; Hypertension in her mother; Lymphoma in her sister; Pneumonia in her father; Uterine cancer in her mother.  Wt Readings from Last 3 Encounters:  12/03/18 208 lb (94.3 kg)  12/02/18 209 lb 6.4 oz (95 kg)  11/09/18 211 lb 11.2 oz (96 kg)    PHYSICAL EXAM BP 134/74   Pulse 62   Ht 5\' 1"  (1.549 m)   Wt 209 lb 6.4 oz (95 kg)   SpO2 96%   BMI 39.57 kg/m  Physical Exam  Constitutional: She is oriented to person, place, and time. She appears well-developed. No  distress.  Morbidly obese.  Well-groomed  HENT:  Head: Normocephalic and atraumatic.  Neck: Normal range of motion. Neck supple. No hepatojugular reflux and no JVD (Unable to assess) present. Carotid bruit is not present. No thyromegaly present.  Cardiovascular: Normal rate, regular rhythm, S1 normal, S2 normal and intact distal pulses.  No extrasystoles are present. PMI is not displaced (Unable to assess). Exam reveals distant heart sounds. Exam reveals no gallop and no friction rub.  No murmur heard. Pulmonary/Chest: Effort normal. No respiratory distress. She exhibits no tenderness.  Distant breath sounds without any obvious wheezes rales or rhonchi.  Abdominal: Soft. Bowel sounds are normal. She exhibits no distension. There is no abdominal tenderness. There is no rebound.  Obese.  Unable to assess HSM  Musculoskeletal: Normal range of motion.        General: Edema (Trivial bilateral lower extremity, but difficult to tell if this is edema versus obesity.) present.  Neurological: She is alert and oriented to person, place, and time.  Psychiatric: She has a normal mood and affect. Her behavior is normal. Judgment and thought content normal.  Vitals reviewed.    Adult ECG Report None  Other studies Reviewed: Additional studies/ records that were reviewed today include:  Recent Labs:  Lab Results  Component Value Date   CHOL 190 10/08/2018   HDL 84 10/08/2018   LDLCALC 92 10/08/2018   TRIG 71 10/08/2018   CHOLHDL 2.3 10/08/2018   Lab Results  Component Value Date   CREATININE 1.28 (H) 10/08/2018   BUN 28 (H) 10/08/2018   NA 142 10/08/2018   K 4.0 10/08/2018   CL 104 10/08/2018   CO2 23 10/08/2018   CBC Latest Ref Rng & Units 10/08/2018 08/23/2018 08/22/2018  WBC 3.4 - 10.8 x10E3/uL 12.0(H) 8.2 7.9  Hemoglobin 11.1 - 15.9 g/dL 11.5 9.7(L) 10.0(L)  Hematocrit 34.0 - 46.6 % 33.3(L) 30.3(L) 31.2(L)  Platelets 150 - 450 x10E3/uL 274 283 269    ASSESSMENT / PLAN: Problem List  Items Addressed This Visit    Aortic regurgitation (Chronic)    Moderate air regurgitation noted on echocardiogram.  I do not hear a murmur on exam.  Was initially mild back in September, now right is moderate in December.  I do not know if this just progression. Plan: Recheck echo in 1 year.      Relevant Medications   apixaban (ELIQUIS) 5 MG TABS tablet   Other Relevant Orders   ECHOCARDIOGRAM COMPLETE   DOE (dyspnea on exertion)    At this point, she does have exertional dyspnea, but is probably is much related to her deconditioning and obesity as well as potential underlying lung disease.  Being evaluated by pulmonary medicine.  Thankfully, it appears that her PE has cleared based on VQ scan results.  Also pulmonary hypertension has improved.  Continued work-up by pulmonary medicine. Follow-up echocardiogram in 1 year to reassess aortic valve regurgitation which is probably not related.      Relevant Orders   ECHOCARDIOGRAM COMPLETE   Essential hypertension (Chronic)    Blood pressure actually is pretty good today. Plan: Continue carvedilol and diltiazem.  Diltiazem added for pulmonary pretension.      Relevant Medications   apixaban (ELIQUIS) 5 MG TABS tablet   Hyperlipidemia LDL goal <70 (Chronic)    LDL was 92.  Goal really is probably somewhere between 70-100.  She is currently on rosuvastatin.  We will continue to follow to see if this is improving, may need to consider increasing to 40 mg.  Relevant Medications   apixaban (ELIQUIS) 5 MG TABS tablet   Morbid obesity ([place]) (Chronic)    Needs to work on weight loss.  Diet and exercise discussed. Unfortunately she has to walk with a walker limited by arthritis pains.      Pulmonary embolism ([place]) - Primary (Chronic)    Appears to have cleared up by VQ scan.  Pulmonary pretension also seems to be resolved. Continue on apixaban for minimum 1 year, would probably go for longer.  Will defer this to Pulmonary Medicine and  PCP.      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 >  50% of the time was spent in direct patient consultation.   Current medicines are reviewed at length with the patient today.  (+/- concerns) she asked about how long she needs to be on Eliquis. The following changes have been made:  Would continue for minimum 1 year, defer to PCCM and PCP  Patient Instructions  Medication Instructions:   CONTINUE ALL CURRENT MEDICATIONS  If you need a refill on your cardiac medications before your next appointment, please call your pharmacy.   Lab work:  NOT NEEDED  If you have labs (blood work) drawn today and your tests are completely normal, you will receive your results only by: Marland Kitchen MyChart Message (if you have MyChart) OR . A paper copy in the mail If you have any lab test that is abnormal or we need to change your treatment, we will call you to review the results.  Testing/Procedures: SCHEDULE AT [place] Your physician has requested that you have an echocardiogram. Echocardiography is a painless test that uses sound waves to create images of your heart. It provides your doctor with information about the size and shape of your heart and how well your heart's chambers and valves are working. This procedure takes approximately one hour. There are no restrictions for this procedure.    Follow-Up: At [facility], you and your health needs are our priority.  As part of our continuing mission to provide you with exceptional heart care, we have created designated Provider Care Teams.  These Care Teams include your primary  Cardiologist (physician) and Advanced Practice Providers (APPs -  Physician Assistants and Nurse Practitioners) who all work together to provide you with the care you need, when you need it. You will need a follow up appointment in 12 months--Jan 2021.  Please call our office 2 months in advance to schedule this appointment.  You may see Glenetta Hew, MD or one of the following Advanced Practice Providers on your designated Care Team:   Rosaria Ferries, PA-C . Jory Sims, DNP, ANP  Any Other Special Instructions Will Be Listed Below (If Applicable).  --- CONTINUE EXERCISING --- CONTINUE SEEING DR RAMASWAMY --- CONTINUE TAKING ELIQUIS    Studies Ordered:   Orders Placed This Encounter  Procedures  . ECHOCARDIOGRAM COMPLETE      Glenetta Hew, M.D., M.S. Interventional Cardiologist   Pager # [phone] Phone # [phone] [address] [place], [place] [address]  Thank you for choosing Heartcare at [facility]!!

## 2018-12-03 ENCOUNTER — Ambulatory Visit (INDEPENDENT_AMBULATORY_CARE_PROVIDER_SITE_OTHER): Payer: Medicare HMO | Admitting: Pulmonary Disease

## 2018-12-03 ENCOUNTER — Encounter: Payer: Self-pay | Admitting: Pulmonary Disease

## 2018-12-03 ENCOUNTER — Ambulatory Visit: Payer: Medicare HMO | Admitting: Pulmonary Disease

## 2018-12-03 ENCOUNTER — Encounter: Payer: Self-pay | Admitting: Cardiology

## 2018-12-03 ENCOUNTER — Ambulatory Visit: Payer: Medicare HMO | Admitting: Internal Medicine

## 2018-12-03 VITALS — BP 112/58 | HR 58 | Ht 61.0 in | Wt 208.0 lb

## 2018-12-03 DIAGNOSIS — I2602 Saddle embolus of pulmonary artery with acute cor pulmonale: Secondary | ICD-10-CM | POA: Diagnosis not present

## 2018-12-03 DIAGNOSIS — Z9189 Other specified personal risk factors, not elsewhere classified: Secondary | ICD-10-CM | POA: Diagnosis not present

## 2018-12-03 DIAGNOSIS — I2782 Chronic pulmonary embolism: Secondary | ICD-10-CM

## 2018-12-03 NOTE — Assessment & Plan Note (Signed)
At this point, she does have exertional dyspnea, but is probably is much related to her deconditioning and obesity as well as potential underlying lung disease.  Being evaluated by pulmonary medicine.  Thankfully, it appears that her PE has cleared based on VQ scan results.  Also pulmonary hypertension has improved.  Continued work-up by pulmonary medicine. Follow-up echocardiogram in 1 year to reassess aortic valve regurgitation which is probably not related.

## 2018-12-03 NOTE — Assessment & Plan Note (Addendum)
Assessment: -Morbid obesity -BMI 39 -Fatigue postmenopausal woman -Elevated pulmonary pressures last echocardiogram showing PA P pressures at 38 -Stop bang score today is 6 -Epworth score today is 5, patient did have confusion while answering these questions -Mallampati 4 on exam  Plan: -I believe the patient is at risk for sleep apnea, will order home sleep study

## 2018-12-03 NOTE — Assessment & Plan Note (Addendum)
Assessment: -Patient with improved shortness of breath and dyspnea since starting Eliquis and treatment in September/2019 -Morbidly obese elderly female -Lungs clear to auscultation -Chronic left lower extremity swelling since 2015 surgery -December/2019 VQ scan shows low probability of PE, December/2019 echocardiogram shows improved PA P pressure down to 38 from 71 in September/2019 -Oxygen saturations today 99% on room air  Plan: -Continue Eliquis as prescribed -Follow-up echocardiogram in 1 year per cardiology -Follow-up with Dr. Chase Caller in 3 months

## 2018-12-03 NOTE — Progress Notes (Signed)
@Patient  ID: Tricia Harrison, female    DOB: [birth year], 73 y.o.   MRN: [id number]  Chief Complaint  Patient presents with  . Follow-up    Discuss test results, three-month follow-up    Referring provider: Lujean Amel, MD  HPI:  73 year old female former smoker followed in our office for pulmonary emboli and pulmonary hypertension  PMH: Hypothyroidism, morbid obesity, type 2 diabetes, aortic regurgitation, GERD, degenerative joint disease, hypertension Smoker/ Smoking History: Former smoker.  10-pack-year smoking history.  Quit 1995. Maintenance:  none Pt of: Dr. Chase Caller  Recent [facility] Encounters:   09/04/2018-office visit-Dr. Chase Caller Plan continue Eliquis, will need treatment for about a year or 2, you are okay to fly, you are fall risk, avoid procedures for 3 months, refer to hematology Dr. Beryle Beams, do VQ scan in December/2019 to rule out chronic pulmonary embolism, do repeat echocardiogram December 2019, check ONO  12/03/2018  - Visit   73 year old female former smoker presenting to our office today for a follow-up visit.  Patient would like to discuss December/2019 VQ scan as well as echocardiogram results.  Patient was last seen in our office in October/2019 for acute PE.  This PE was diagnosed in September/2019 a CT Angio.  This was the first PE for the patient as well as unprovoked.  Patient reports that symptoms have improved significantly and shortness of breath has improved.  Patient reports her shortness of breath is back to baseline.  Patient denies chest pain or heart racing.  Patient does have chronic lower extremity swelling her left extremity status post 2015 knee surgery.    Tests:  11/23/2018-VQ scan- low probability of PE  11/23/2018- chest x-ray- negative for acute cardiopulmonary disease  08/20/2018-CT Angio- positive for bilateral nonocclusive pulmonary emboli with CT evidence of right heart strain, no pneumonia or effusion  seen  11/16/2018-echocardiogram- left ventricular LV ejection fraction 60 to 14%, grade 1 diastolic dysfunction, pulmonary arteries PAP pressure 38  08/19/2018-echocardiogram-LV ejection fraction 55 to 43%, grade 1 diastolic dysfunction, PAP pressure 71  November/2019- oh now on room air-only below 88% for 6 seconds  FENO:  No results found for: NITRICOXIDE  PFT: No flowsheet data found.  Imaging: Dg Chest 2 View  Result Date: 11/23/2018 CLINICAL DATA:  73 year old female with a history of asthma and cough EXAM: CHEST - 2 VIEW COMPARISON:  CT 08/20/2018, chest x-ray 08/18/2018 FINDINGS: Cardiomediastinal silhouette unchanged in size and contour. Tortuosity of the aorta. Scoliotic curvature of the thoracolumbar spine again noted. No confluent airspace disease, pleural effusion, or pneumothorax. No evidence of pulmonary edema. IMPRESSION: Negative for acute cardiopulmonary disease Electronically Signed   By: Corrie Mckusick D.O.   On: 11/23/2018 08:44   Nm Pulmonary Per & Vent  Result Date: 11/23/2018 CLINICAL DATA:  Suspected. History of recent.S. Elevated troponins. EXAM: NUCLEAR MEDICINE VENTILATION - PERFUSION LUNG SCAN TECHNIQUE: Ventilation images were obtained in multiple projections using inhaled aerosol Tc-78m DTPA. Perfusion images were obtained in multiple projections after intravenous injection of Tc-41m MAA. RADIOPHARMACEUTICALS:  30.0 mCi of Tc-7m DTPA aerosol inhalation and 4.0 mCi Tc45m MAA IV COMPARISON:  CT 08/20/2018. FINDINGS: No ventilation perfusion mismatches to suggest pulmonary embolus. This is a low probability scan. However given patient's prior history and prior recent CT findings of pulmonary embolus it may be wise to perform CT of the chest to exclude pulmonary embolus given the clinical concern. IMPRESSION: Low probability pulmonary embolus. Electronically Signed   By: Marcello Moores  Register   On: 11/23/2018 10:51  Specialty Problems      Pulmonary Problems    DOE (dyspnea on exertion)      Allergies  Allergen Reactions  . Atorvastatin Other (See Comments)    Muscle pain  . Codeine Nausea And Vomiting  . Morphine And Related     B/p drops     Immunization History  Administered Date(s) Administered  . Influenza, High Dose Seasonal PF 08/20/2018    Past Medical History:  Diagnosis Date  . Anemia    as a child  . Asthma    as a child  . Chronic back pain    spinal stenosis and buldging disc;scoliosis  . Chronic constipation    occasionally takes something OTC  . Diabetes mellitus without complication ([place])    per pt "Pre" for 20+yrs  . Diverticulosis   . Dysphagia   . Gallstones    has known about this for 3-52yrs  . GERD (gastroesophageal reflux disease)    takes Omeprazole daily  . H/O hiatal hernia   . Hemorrhoids   . History of blood transfusion    no abnormal reaction noted  . History of bronchitis    states its been a long time ago  . History of gout   . HLD (hyperlipidemia)    takes Fenofibrate daily  . HTN (hypertension)    takes Diltiazem and Ramipril daily  . Hypothyroidism    takes Synthroid daily  . Joint swelling   . Left knee DJD   . Nocturia   . Palpitations    started in 2013 and only occasionally;last time noticed about 2-3wks ago and after drinking caffeine  . PONV (postoperative nausea and vomiting)   . 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    Tobacco History: Social History   Tobacco Use  Smoking Status Former Smoker  . Packs/day: 0.50  . Years: 20.00  . Pack years: 10.00  . Types: Cigarettes  . Start date: 09/04/1964  . Last attempt to quit: 11/25/1993  . Years since quitting: 25.0  Smokeless Tobacco Never Used  Tobacco Comment   quit smoking in 1995   Counseling given: Not Answered Comment: quit smoking in 1995  Continue to not smoke  Outpatient  Encounter Medications as of 12/03/2018  Medication Sig  . acetaminophen (TYLENOL) 500 MG tablet Take 1,000 mg by mouth every 6 (six) hours as needed for mild pain.  Marland Kitchen albuterol (PROVENTIL HFA;VENTOLIN HFA) 108 (90 Base) MCG/ACT inhaler Inhale 2 puffs into the lungs every 6 (six) hours as needed.  Marland Kitchen apixaban (ELIQUIS) 5 MG TABS tablet Take 1 tablet (5 mg total) by mouth 2 (two) times daily.  . bisacodyl (DULCOLAX) 5 MG EC tablet Take 2 tablets every night with dinner until bowel movement.  LAXITIVE.  Restart if two days since last bowel movement  . carvedilol (COREG) 6.25 MG tablet Take 1 tablet (6.25 mg total) by mouth 2 (two) times daily with a meal.  . Cholecalciferol (VITAMIN D3) 2000 UNITS TABS Take 2,000 mg by mouth daily.  Marland Kitchen diltiazem (CARDIZEM LA) 360 MG 24 hr tablet Take 360 mg by mouth daily.  . DUREZOL 0.05 % EMUL Place 1 drop into both eyes daily.  . fenofibrate (TRICOR) 145 MG tablet Take 145 mg by mouth daily.  Marland Kitchen ketorolac (ACULAR) 0.5 % ophthalmic solution Place 1 drop into both eyes 3 (three) times  daily.  . levothyroxine (SYNTHROID, LEVOTHROID) 50 MCG tablet Take 50 mcg by mouth daily before breakfast.  . magnesium hydroxide (MILK OF MAGNESIA) 800 MG/5ML suspension Take 15 mLs by mouth daily as needed for constipation.  Marland Kitchen omeprazole (PRILOSEC) 20 MG capsule Take 20 mg by mouth daily.  . rosuvastatin (CRESTOR) 20 MG tablet Take 1 tablet (20 mg total) by mouth daily at 6 PM.   No facility-administered encounter medications on file as of 12/03/2018.      Review of Systems  Review of Systems  Constitutional: Positive for fatigue. Negative for chills and fever.  HENT: Negative for congestion, sinus pressure, sinus pain and sneezing.   Respiratory: Negative for cough, chest tightness, shortness of breath and wheezing.        + Hemoptysis  Cardiovascular: Positive for leg swelling (left leg swelling chronically s/p 2015 knee surgery). Negative for chest pain and palpitations.   Gastrointestinal: Negative for blood in stool, diarrhea, nausea and vomiting.  Genitourinary: Negative for hematuria.  Neurological: Negative for light-headedness.  Psychiatric/Behavioral: Positive for sleep disturbance.     STOP BANG questionnaire  Snoring? Yes Tiredness? Yes  Observed apneas? No  Elevated blood pressure? Yes on meds BMI greater than 35? Yes  Age greater than 6? Yes  Neck circumference greater than 40 cm? yes Female gender? No   Scoring:  One-point is signed for each.   0-2 equals low risk, 3-4 equals intermediate risk, those with 5 or greater high risk for having obstructive sleep apnea  Score: 6  Results of the Epworth flowsheet 12/03/2018  Sitting and reading 2  Watching TV 1  Sitting, inactive in a public place (e.g. a theatre or a meeting) 0  As a passenger in a car for an hour without a break 2  Lying down to rest in the afternoon when circumstances permit 0  Sitting and talking to someone 0  Sitting quietly after a lunch without alcohol 0  In a car, while stopped for a few minutes in traffic 0  Total score 5      Physical Exam  BP (!) 112/58 (BP Location: Left Arm, Cuff Size: Normal)   Pulse (!) 58   Ht 5\' 1"  (1.549 m)   Wt 208 lb (94.3 kg)   SpO2 99%   BMI 39.30 kg/m   Wt Readings from Last 5 Encounters:  12/03/18 208 lb (94.3 kg)  12/02/18 209 lb 6.4 oz (95 kg)  11/09/18 211 lb 11.2 oz (96 kg)  09/04/18 209 lb (94.8 kg)  08/27/18 209 lb (94.8 kg)     Physical Exam  Constitutional: She is oriented to person, place, and time and well-developed, well-nourished, and in no distress. No distress.  HENT:  Head: Normocephalic and atraumatic.  Right Ear: Hearing, tympanic membrane, external ear and ear canal normal.  Left Ear: Hearing, tympanic membrane, external ear and ear canal normal.  Nose: Nose normal. Right sinus exhibits no maxillary sinus tenderness and no frontal sinus tenderness. Left sinus exhibits no maxillary sinus  tenderness and no frontal sinus tenderness.  Mouth/Throat: Uvula is midline and oropharynx is clear and moist. No oropharyngeal exudate.  +mallampati 4  Eyes: Pupils are equal, round, and reactive to light.  Neck: Normal range of motion. Neck supple.  Cardiovascular: Regular rhythm and normal heart sounds. Bradycardia present.  Pulmonary/Chest: Effort normal and breath sounds normal. No accessory muscle usage. No respiratory distress. She has no decreased breath sounds. She has no wheezes. She has no rhonchi. She has  no rales.  Slight upper airway wheeze  Musculoskeletal: Normal range of motion.        General: Edema (left > right ) present.  Lymphadenopathy:    She has no cervical adenopathy.  Neurological: She is alert and oriented to person, place, and time. Gait normal.  Skin: Skin is warm and dry. She is not diaphoretic. No erythema.  Psychiatric: Mood, memory, affect and judgment normal. Her mood appears not anxious. She does not exhibit a depressed mood.  Nursing note and vitals reviewed.     Lab Results:  CBC    Component Value Date/Time   WBC 12.0 (H) 10/08/2018 0929   WBC 8.2 08/23/2018 0406   RBC 3.81 10/08/2018 0929   RBC 3.20 (L) 08/23/2018 0406   HGB 11.5 10/08/2018 0929   HCT 33.3 (L) 10/08/2018 0929   PLT 274 10/08/2018 0929   MCV 87 10/08/2018 0929   MCH 30.2 10/08/2018 0929   MCH 30.3 08/23/2018 0406   MCHC 34.5 10/08/2018 0929   MCHC 32.0 08/23/2018 0406   RDW 13.7 10/08/2018 0929    BMET    Component Value Date/Time   NA 142 10/08/2018 0929   K 4.0 10/08/2018 0929   CL 104 10/08/2018 0929   CO2 23 10/08/2018 0929   GLUCOSE 83 10/08/2018 0929   GLUCOSE 105 (H) 08/19/2018 0638   BUN 28 (H) 10/08/2018 0929   CREATININE 1.28 (H) 10/08/2018 0929   CALCIUM 9.5 10/08/2018 0929   GFRNONAA 42 (L) 10/08/2018 0929   GFRAA 48 (L) 10/08/2018 0929    BNP No results found for: BNP  ProBNP No results found for: PROBNP    Assessment & Plan:    At  risk for sleep apnea Assessment: -Morbid obesity -BMI 39 -Fatigue postmenopausal woman -Elevated pulmonary pressures last echocardiogram showing PA P pressures at 38 -Stop bang score today is 6 -Epworth score today is 5, patient did have confusion while answering these questions -Mallampati 4 on exam  Plan: -I believe the patient is at risk for sleep apnea, will order home sleep study   Pulmonary embolism (HCC) Assessment: -Patient with improved shortness of breath and dyspnea since starting Eliquis and treatment in September/2019 -Morbidly obese elderly female -Lungs clear to auscultation -Chronic left lower extremity swelling since 2015 surgery -December/2019 VQ scan shows low probability of PE, December/2019 echocardiogram shows improved PA P pressure down to 38 from 71 in September/2019 -Oxygen saturations today 99% on room air  Plan: -Continue Eliquis as prescribed -Follow-up echocardiogram in 1 year per cardiology -Follow-up with Dr. Chase Caller in 3 months      Lauraine Rinne, NP 12/03/2018   This appointment was 30 min long with over 50% of the time in direct face-to-face patient care, assessment, plan of care, and follow-up.

## 2018-12-03 NOTE — Assessment & Plan Note (Signed)
Blood pressure actually is pretty good today. Plan: Continue carvedilol and diltiazem.  Diltiazem added for pulmonary pretension.

## 2018-12-03 NOTE — Assessment & Plan Note (Signed)
Needs to work on weight loss.  Diet and exercise discussed. Unfortunately she has to walk with a walker limited by arthritis pains.

## 2018-12-03 NOTE — Assessment & Plan Note (Signed)
Moderate air regurgitation noted on echocardiogram.  I do not hear a murmur on exam.  Was initially mild back in September, now right is moderate in December.  I do not know if this just progression. Plan: Recheck echo in 1 year.

## 2018-12-03 NOTE — Progress Notes (Deleted)
@Patient  ID: Tricia Harrison, female    DOB: 1946-01-09, 73 y.o.   MRN: [id number]  No chief complaint on file.   Referring provider: Lujean Amel, MD  HPI:   PMH:  Smoker/ Smoking History:  Maintenance:   Pt of: Dr. Leeanne Mannan  Recent [facility] Encounters:   09/04/2018-office visit-Dr. Chase Caller Plan continue Eliquis, will need treatment for about a year or 2, you are okay to fly, you are fall risk, avoid procedures for 3 months, refer to hematology Dr. Beryle Beams, do VQ scan in December/2019 to rule out chronic pulmonary embolism, do repeat echocardiogram December 2019, check ON oh  12/03/2018  - Visit   HPI  Tests:    FENO:  No results found for: NITRICOXIDE  PFT: No flowsheet data found.  Imaging: Dg Chest 2 View  Result Date: 11/23/2018 CLINICAL DATA:  72 year old female with a history of asthma and cough EXAM: CHEST - 2 VIEW COMPARISON:  CT 08/20/2018, chest x-ray 08/18/2018 FINDINGS: Cardiomediastinal silhouette unchanged in size and contour. Tortuosity of the aorta. Scoliotic curvature of the thoracolumbar spine again noted. No confluent airspace disease, pleural effusion, or pneumothorax. No evidence of pulmonary edema. IMPRESSION: Negative for acute cardiopulmonary disease Electronically Signed   By: Corrie Mckusick D.O.   On: 11/23/2018 08:44   Nm Pulmonary Per & Vent  Result Date: 11/23/2018 CLINICAL DATA:  Suspected. History of recent.S. Elevated troponins. EXAM: NUCLEAR MEDICINE VENTILATION - PERFUSION LUNG SCAN TECHNIQUE: Ventilation images were obtained in multiple projections using inhaled aerosol Tc-32m DTPA. Perfusion images were obtained in multiple projections after intravenous injection of Tc-37m MAA. RADIOPHARMACEUTICALS:  30.0 mCi of Tc-30m DTPA aerosol inhalation and 4.0 mCi Tc80m MAA IV COMPARISON:  CT 08/20/2018. FINDINGS: No ventilation perfusion mismatches to suggest pulmonary embolus. This is a low probability scan. However given  patient's prior history and prior recent CT findings of pulmonary embolus it may be wise to perform CT of the chest to exclude pulmonary embolus given the clinical concern. IMPRESSION: Low probability pulmonary embolus. Electronically Signed   By: Marcello Moores  Register   On: 11/23/2018 10:51      Specialty Problems      Pulmonary Problems   DOE (dyspnea on exertion)      Allergies  Allergen Reactions  . Atorvastatin Other (See Comments)    Muscle pain  . Codeine Nausea And Vomiting  . Morphine And Related     B/p drops     Immunization History  Administered Date(s) Administered  . Influenza, High Dose Seasonal PF 08/20/2018    Past Medical History:  Diagnosis Date  . Anemia    as a child  . Asthma    as a child  . Chronic back pain    spinal stenosis and buldging disc;scoliosis  . Chronic constipation    occasionally takes something OTC  . Diabetes mellitus without complication ([place])    per pt "Pre" for 20+yrs  . Diverticulosis   . Dysphagia   . Gallstones    has known about this for 3-65yrs  . GERD (gastroesophageal reflux disease)    takes Omeprazole daily  . H/O hiatal hernia   . Hemorrhoids   . History of blood transfusion    no abnormal reaction noted  . History of bronchitis    states its been a long time ago  . History of gout   . HLD (hyperlipidemia)    takes Fenofibrate daily  . HTN (hypertension)    takes Diltiazem and Ramipril daily  .  Hypothyroidism    takes Synthroid daily  . Joint swelling   . Left knee DJD   . Nocturia   . Palpitations    started in 2013 and only occasionally;last time noticed about 2-3wks ago and after drinking caffeine  . PONV (postoperative nausea and vomiting)   . 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    Tobacco History: Social History   Tobacco Use  Smoking Status Former Smoker  . Packs/day:  0.50  . Years: 20.00  . Pack years: 10.00  . Types: Cigarettes  . Start date: 09/04/1964  . Last attempt to quit: 11/25/1993  . Years since quitting: 25.0  Smokeless Tobacco Never Used  Tobacco Comment   quit smoking in 1995   Counseling given: Not Answered Comment: quit smoking in 1995   Outpatient Encounter Medications as of 12/03/2018  Medication Sig  . acetaminophen (TYLENOL) 500 MG tablet Take 1,000 mg by mouth every 6 (six) hours as needed for mild pain.  Marland Kitchen albuterol (PROVENTIL HFA;VENTOLIN HFA) 108 (90 Base) MCG/ACT inhaler Inhale 2 puffs into the lungs every 6 (six) hours as needed.  Marland Kitchen apixaban (ELIQUIS) 5 MG TABS tablet Take 1 tablet (5 mg total) by mouth 2 (two) times daily.  . bisacodyl (DULCOLAX) 5 MG EC tablet Take 2 tablets every night with dinner until bowel movement.  LAXITIVE.  Restart if two days since last bowel movement  . carvedilol (COREG) 6.25 MG tablet Take 1 tablet (6.25 mg total) by mouth 2 (two) times daily with a meal.  . Cholecalciferol (VITAMIN D3) 2000 UNITS TABS Take 2,000 mg by mouth daily.  Marland Kitchen diltiazem (CARDIZEM LA) 360 MG 24 hr tablet Take 360 mg by mouth daily.  . DUREZOL 0.05 % EMUL Place 1 drop into both eyes daily.  . fenofibrate (TRICOR) 145 MG tablet Take 145 mg by mouth daily.  Marland Kitchen ketorolac (ACULAR) 0.5 % ophthalmic solution Place 1 drop into both eyes 3 (three) times daily.  Marland Kitchen levothyroxine (SYNTHROID, LEVOTHROID) 50 MCG tablet Take 50 mcg by mouth daily before breakfast.  . magnesium hydroxide (MILK OF MAGNESIA) 800 MG/5ML suspension Take 15 mLs by mouth daily as needed for constipation.  Marland Kitchen omeprazole (PRILOSEC) 20 MG capsule Take 20 mg by mouth daily.  . rosuvastatin (CRESTOR) 20 MG tablet Take 1 tablet (20 mg total) by mouth daily at 6 PM.   No facility-administered encounter medications on file as of 12/03/2018.      Review of Systems  Review of Systems   Physical Exam  There were no vitals taken for this visit.  Wt Readings from  Last 5 Encounters:  12/02/18 209 lb 6.4 oz (95 kg)  11/09/18 211 lb 11.2 oz (96 kg)  09/04/18 209 lb (94.8 kg)  08/27/18 209 lb (94.8 kg)  08/23/18 207 lb 9.6 oz (94.2 kg)     Physical Exam    Lab Results:  CBC    Component Value Date/Time   WBC 12.0 (H) 10/08/2018 0929   WBC 8.2 08/23/2018 0406   RBC 3.81 10/08/2018 0929   RBC 3.20 (L) 08/23/2018 0406   HGB 11.5 10/08/2018 0929   HCT 33.3 (L) 10/08/2018 0929   PLT 274 10/08/2018 0929   MCV 87 10/08/2018 0929   MCH 30.2 10/08/2018 0929   MCH 30.3 08/23/2018 0406   MCHC 34.5 10/08/2018 0929   MCHC 32.0  08/23/2018 0406   RDW 13.7 10/08/2018 0929    BMET    Component Value Date/Time   NA 142 10/08/2018 0929   K 4.0 10/08/2018 0929   CL 104 10/08/2018 0929   CO2 23 10/08/2018 0929   GLUCOSE 83 10/08/2018 0929   GLUCOSE 105 (H) 08/19/2018 0638   BUN 28 (H) 10/08/2018 0929   CREATININE 1.28 (H) 10/08/2018 0929   CALCIUM 9.5 10/08/2018 0929   GFRNONAA 42 (L) 10/08/2018 0929   GFRAA 48 (L) 10/08/2018 0929    BNP No results found for: BNP  ProBNP No results found for: PROBNP    Assessment & Plan:     No problem-specific Assessment & Plan notes found for this encounter.     Lauraine Rinne, NP 12/03/2018   This appointment was *** with over 50% of the time in direct face-to-face patient care, assessment, plan of care, and follow-up.

## 2018-12-03 NOTE — Patient Instructions (Addendum)
Continue Eliquis  We will order a home sleep study to check for obstructive sleep apnea  Follow-up echocardiogram in 1 year  Continue follow-up with cardiology as planned Continue follow-up with Dr. Beryle Beams as planned  Follow-up in 3 months with Dr. Chase Caller  Follow-up sooner if symptoms are worsening  It is flu season:   >>>Remember to be washing your hands regularly, using hand sanitizer, be careful to use around herself with has contact with people who are sick will increase her chances of getting sick yourself. >>> Best ways to protect herself from the flu: Receive the yearly flu vaccine, practice good hand hygiene washing with soap and also using hand sanitizer when available, eat a nutritious meals, get adequate rest, hydrate appropriately   Please contact the office if your symptoms worsen or you have concerns that you are not improving.   Thank you for choosing [facility] for your healthcare, and for allowing [place] to partner with you on your healthcare journey. I am thankful to be able to provide care to you today.   Wyn Quaker FNP-C

## 2018-12-03 NOTE — Assessment & Plan Note (Signed)
Appears to have cleared up by VQ scan.  Pulmonary pretension also seems to be resolved. Continue on apixaban for minimum 1 year, would probably go for longer.  Will defer this to Pulmonary Medicine and PCP.

## 2018-12-03 NOTE — Assessment & Plan Note (Signed)
LDL was 92.  Goal really is probably somewhere between 70-100.  She is currently on rosuvastatin.  We will continue to follow to see if this is improving, may need to consider increasing to 40 mg.

## 2018-12-15 ENCOUNTER — Encounter: Payer: Medicare HMO | Admitting: Oncology

## 2018-12-17 DIAGNOSIS — G4733 Obstructive sleep apnea (adult) (pediatric): Secondary | ICD-10-CM

## 2018-12-17 DIAGNOSIS — Z9189 Other specified personal risk factors, not elsewhere classified: Secondary | ICD-10-CM

## 2018-12-24 DIAGNOSIS — G4733 Obstructive sleep apnea (adult) (pediatric): Secondary | ICD-10-CM | POA: Diagnosis not present

## 2018-12-25 ENCOUNTER — Telehealth: Payer: Self-pay

## 2018-12-25 DIAGNOSIS — G4733 Obstructive sleep apnea (adult) (pediatric): Secondary | ICD-10-CM

## 2018-12-25 NOTE — Telephone Encounter (Signed)
Called and spoke with patient regarding results.  Informed the patient of results and recommendations today. Scheduled 73mo f/u today for 02/26/2019 at [facility] auto cpap machine order for auto 5-15, mask of choice and supplies. Pt verbalized understanding and denied any questions or concerns at this time.  Nothing further needed.   Dr. Ander Slade has reviewed the home sleep test this showed mild OSA.   Recommendations   Treatment options are CPAP with the settings auto 5 to 15.   Weight loss measures .  Advise against driving while sleepy & against medication with sedative side effects.   Make appointment for 3 months for compliance with download with Dr. Ander Slade.

## 2018-12-31 DIAGNOSIS — M109 Gout, unspecified: Secondary | ICD-10-CM | POA: Diagnosis not present

## 2018-12-31 DIAGNOSIS — I82502 Chronic embolism and thrombosis of unspecified deep veins of left lower extremity: Secondary | ICD-10-CM | POA: Diagnosis not present

## 2018-12-31 DIAGNOSIS — E041 Nontoxic single thyroid nodule: Secondary | ICD-10-CM | POA: Diagnosis not present

## 2018-12-31 DIAGNOSIS — I2782 Chronic pulmonary embolism: Secondary | ICD-10-CM | POA: Diagnosis not present

## 2018-12-31 DIAGNOSIS — K219 Gastro-esophageal reflux disease without esophagitis: Secondary | ICD-10-CM | POA: Diagnosis not present

## 2018-12-31 DIAGNOSIS — E890 Postprocedural hypothyroidism: Secondary | ICD-10-CM | POA: Diagnosis not present

## 2018-12-31 DIAGNOSIS — I272 Pulmonary hypertension, unspecified: Secondary | ICD-10-CM | POA: Diagnosis not present

## 2018-12-31 DIAGNOSIS — E785 Hyperlipidemia, unspecified: Secondary | ICD-10-CM | POA: Diagnosis not present

## 2018-12-31 DIAGNOSIS — I10 Essential (primary) hypertension: Secondary | ICD-10-CM | POA: Diagnosis not present

## 2019-01-11 ENCOUNTER — Telehealth: Payer: Self-pay | Admitting: Cardiology

## 2019-01-11 NOTE — Telephone Encounter (Signed)
Pt requests to speak with someone regarding her carvedilol and why she is taking the medication. Informed pt that she was taken off her ACE inhibitor (ramipril) during her ED visit on 08/18/2018 and a beta blocker (carvedilol) was added. Informed her that the ACE inhibitor was discontinued "to protect renal function", per H&P, and a beta blocker was added. Pt verbalized understanding.  Pt states she was concerned whether carvedilol or ophthalmic solution azathioprine making her lethargic. Informed pt that it may not be the carvedilol, but note would be forwarded to pharmD for review. Pt agreeable with this

## 2019-01-11 NOTE — Telephone Encounter (Signed)
She has been on carvedilol since October - she would have complained about this after just a few days had it been the carvedilol.  I can't speak to the eyedrops, as the medication in oral form can cause fatigue, but I don't work with eyedrops enough to know.  She should ask her ophthalmologist.

## 2019-01-11 NOTE — Telephone Encounter (Signed)
Returned call to pt for pharmacist recommendations. Pt states that her azathioprine Rx is in oral form and that she did read on the medication's list of side effects that the med may cause drowsiness. Pt states that she has an appt tomorrow with her ophthalmologist. Consulted with pharmD [place], [place] who recommends that pt follow up with ophthalmologist for eval. Pt verbalized understanding and agreeable with this

## 2019-01-11 NOTE — Telephone Encounter (Signed)
° ° °  Pt c/o medication issue:  1. Name of Medication: carvedilol (COREG) 6.25 MG tablet  2. How are you currently taking this medication (dosage and times per day)? As written  3. Are you having a reaction (difficulty breathing--STAT)? no 4. What is your medication issue? Patient wants to know the reason she is taking Carvedilol

## 2019-01-12 DIAGNOSIS — H3581 Retinal edema: Secondary | ICD-10-CM | POA: Diagnosis not present

## 2019-01-12 DIAGNOSIS — Z79899 Other long term (current) drug therapy: Secondary | ICD-10-CM | POA: Diagnosis not present

## 2019-01-12 DIAGNOSIS — Z961 Presence of intraocular lens: Secondary | ICD-10-CM | POA: Diagnosis not present

## 2019-01-12 DIAGNOSIS — H30033 Focal chorioretinal inflammation, peripheral, bilateral: Secondary | ICD-10-CM | POA: Diagnosis not present

## 2019-01-13 DIAGNOSIS — I2729 Other secondary pulmonary hypertension: Secondary | ICD-10-CM | POA: Diagnosis not present

## 2019-01-13 DIAGNOSIS — I2602 Saddle embolus of pulmonary artery with acute cor pulmonale: Secondary | ICD-10-CM | POA: Diagnosis not present

## 2019-01-13 DIAGNOSIS — G4733 Obstructive sleep apnea (adult) (pediatric): Secondary | ICD-10-CM | POA: Diagnosis not present

## 2019-01-24 HISTORY — PX: OTHER SURGICAL HISTORY: SHX169

## 2019-02-11 DIAGNOSIS — I2729 Other secondary pulmonary hypertension: Secondary | ICD-10-CM | POA: Diagnosis not present

## 2019-02-11 DIAGNOSIS — G4733 Obstructive sleep apnea (adult) (pediatric): Secondary | ICD-10-CM | POA: Diagnosis not present

## 2019-02-11 DIAGNOSIS — I2602 Saddle embolus of pulmonary artery with acute cor pulmonale: Secondary | ICD-10-CM | POA: Diagnosis not present

## 2019-02-12 ENCOUNTER — Other Ambulatory Visit: Payer: Self-pay | Admitting: Adult Health

## 2019-02-12 NOTE — Telephone Encounter (Signed)
Pt is a 73 yr old female who saw Dr. Ellyn Hack on 12/02/18, wt at that visit was 95Kg. Last noted SCr was 1.28 on 10/08/18. Will refill Eliquis 5mg  BID.

## 2019-02-26 ENCOUNTER — Ambulatory Visit: Payer: Medicare HMO | Admitting: Pulmonary Disease

## 2019-03-08 NOTE — Progress Notes (Signed)
Virtual Visit via Telephone Note  I connected with Tricia Harrison on 03/09/19 at 11:30 AM EDT by telephone and verified that I am speaking with the correct person using two identifiers.   I discussed the limitations, risks, security and privacy concerns of performing an evaluation and management service by telephone and the availability of in person appointments. I also discussed with the patient that there may be a patient responsible charge related to this service. The patient expressed understanding and agreed to proceed.   History of Present Illness: 73 year old female former smoker followed in our office for pulmonary emboli, pulmonary hypertension, and OSA  PMH: Hypothyroidism, morbid obesity, type 2 diabetes, aortic regurgitation, GERD, degenerative joint disease, hypertension Smoker/ Smoking History: Former smoker.  10-pack-year smoking history.  Quit 1995. Maintenance:  none Pt of: Dr. Chase Caller for Pulm, Dr. Ander Slade for Sleep   Patient consented to consult via telephone: Yes  People present and their role in pt care: Pt   Chief complaint: OSA, managed on CPAP  73 year old female former smoker followed in our office for history of pulmonary embolism (managed on Eliquis) as well as mild obstructive sleep apnea.  This tele-visit is to evaluate the patient after starting CPAP therapy.  Patient reports that she is struggled with CPAP use since starting CPAP therapy.  Patient reports that she is frustrated by the fact that her head strap is broken and she is contacted her DME company [employer] multiple times and has not gotten a response.  She is also frustrated by her inability to sleep in multiple positions because of the mask.  Patient also reports that occasionally when she opens up her mouth the pressure leaks out which causes her to cough.  Patient is using a nasal mask without a chinstrap.  When offered a chinstrap today patient declined and reports this happening that often.    02/06/2019-03/07/2019-CPAP compliance-5 hours and 23 minutes, APAP settings 5-15, AHI 4.7 >>> When evaluating patient's AHI over the last 30 days patient has a adequate and well-controlled AHI with the exception of 3/31 and 4/1.  When I discussed this with the patient today patient reports that she had really poor acid reflux that night and slept extremely poorly.  Patient is willing to proceed forward with continuing CPAP therapy.  I offered referral to oral appliance, patient declined that today.  We also discussed the importance of her actively working on trying to lose weight.  We reviewed this extensively and how this affects obstructive sleep apnea as well as acid reflux.      Observations/Objective:  11/23/2018-VQ scan- low probability of PE  11/23/2018- chest x-ray- negative for acute cardiopulmonary disease  08/20/2018-CT Angio- positive for bilateral nonocclusive pulmonary emboli with CT evidence of right heart strain, no pneumonia or effusion seen  11/16/2018-echocardiogram- left ventricular LV ejection fraction 60 to 35%, grade 1 diastolic dysfunction, pulmonary arteries PAP pressure 38  08/19/2018-echocardiogram-LV ejection fraction 55 to 46%, grade 1 diastolic dysfunction, PAP pressure 71  November/2019- ono on room air-only below 88% for 6 seconds  12/17/2018-Home sleep study-AHI 11.9, SaO2 low 86%  No results found for: NITRICOXIDE  Assessment and Plan:  Pulmonary embolism (HCC) P:  Continue eliquis   Obstructive sleep apnea A:  Mild OSA - 11/2018 HST - ahi 11.9 Using nasal mask   P:  Continue CPAP therapy  3 month follow up with Dr. Ander Slade  Could consider chin strap in the future   Morbid obesity ([place]) P:  Continue to work on [employer]  loss  Follow acid reflux diet    GERD (gastroesophageal reflux disease) P:  Continue omeprazole as prescribed Read GERD lifestyle changes on AVS material mailed to patient today Read AVS material on GERD diet as well as  GERD    Follow Up Instructions:  Return in about 3 months (around 06/08/2019), or if symptoms worsen or fail to improve, for Follow up with Dr. Ander Slade.    I discussed the assessment and treatment plan with the patient. The patient was provided an opportunity to ask questions and all were answered. The patient agreed with the plan and demonstrated an understanding of the instructions.   The patient was advised to call back or seek an in-person evaluation if the symptoms worsen or if the condition fails to improve as anticipated.  I provided 24 minutes of non-face-to-face time during this encounter.   Lauraine Rinne, NP

## 2019-03-09 ENCOUNTER — Ambulatory Visit: Payer: Medicare HMO | Admitting: Internal Medicine

## 2019-03-09 ENCOUNTER — Ambulatory Visit (INDEPENDENT_AMBULATORY_CARE_PROVIDER_SITE_OTHER): Payer: Medicare HMO | Admitting: Pulmonary Disease

## 2019-03-09 ENCOUNTER — Other Ambulatory Visit: Payer: Self-pay | Admitting: General Surgery

## 2019-03-09 ENCOUNTER — Encounter: Payer: Self-pay | Admitting: Pulmonary Disease

## 2019-03-09 ENCOUNTER — Other Ambulatory Visit: Payer: Self-pay

## 2019-03-09 DIAGNOSIS — K219 Gastro-esophageal reflux disease without esophagitis: Secondary | ICD-10-CM | POA: Diagnosis not present

## 2019-03-09 DIAGNOSIS — G4733 Obstructive sleep apnea (adult) (pediatric): Secondary | ICD-10-CM | POA: Insufficient documentation

## 2019-03-09 DIAGNOSIS — I2782 Chronic pulmonary embolism: Secondary | ICD-10-CM

## 2019-03-09 DIAGNOSIS — I2602 Saddle embolus of pulmonary artery with acute cor pulmonale: Secondary | ICD-10-CM

## 2019-03-09 NOTE — Assessment & Plan Note (Signed)
P:  Continue omeprazole as prescribed Read GERD lifestyle changes on AVS material mailed to patient today Read AVS material on GERD diet as well as GERD

## 2019-03-09 NOTE — Assessment & Plan Note (Signed)
A:  Mild OSA - 11/2018 HST - ahi 11.9 Using nasal mask   P:  Continue CPAP therapy  3 month follow up with Dr. Ander Slade  Could consider chin strap in the future

## 2019-03-09 NOTE — Assessment & Plan Note (Signed)
P:  Continue to work on weight loss  Follow acid reflux diet

## 2019-03-09 NOTE — Patient Instructions (Addendum)
Continue CPAP use   We recommend that you continue using your CPAP daily >>>Keep up the hard work using your device >>> Goal should be wearing this for the entire night that you are sleeping, at least 4 to 6 hours  Remember:  . Do not drive or operate heavy machinery if tired or drowsy.  . Please notify the supply company and office if you are unable to use your device regularly due to missing supplies or machine being broken.  . Work on maintaining a healthy weight and following your recommended nutrition plan  . Maintain proper daily exercise and movement  . Maintaining proper use of your device can also help improve management of other chronic illnesses such as: Blood pressure, blood sugars, and weight management.   BiPAP/ CPAP Cleaning:  >>>Clean weekly, with Dawn soap, and bottle brush.  Set up to air dry.   Omeprazole 20 mg tablet  >>>Please take 1 tablet daily 15 minutes to 30 minutes before your first meal of the day as well as before your other medications >>>Try to take at the same time each day >>>take this medication daily  GERD management: >>>Avoid laying flat until 2 hours after meals >>>Elevate head of the bed including entire chest >>>Reduce size of meals and amount of fat, acid, spices, caffeine and sweets >>>If you are smoking, Please stop! >>>Decrease alcohol consumption >>>Work on maintaining a healthy weight with normal BMI     Return in about 3 months (around 06/08/2019), or if symptoms worsen or fail to improve, for Follow up with Dr. Ander Slade.      Coronavirus (COVID-19) Are you at risk?  Are you at risk for the Coronavirus (COVID-19)?  To be considered HIGH RISK for Coronavirus (COVID-19), you have to meet the following criteria:  . Traveled to [place], [place], [place], [place] or [place]; or in the [place] to [place], [place], [place], or [place]; and have fever, cough, and shortness of breath within the last 2 weeks of travel OR . Been  in close contact with a person diagnosed with COVID-19 within the last 2 weeks and have fever, cough, and shortness of breath . IF YOU DO NOT MEET THESE CRITERIA, YOU ARE CONSIDERED LOW RISK FOR COVID-19.  What to do if you are HIGH RISK for COVID-19?  Marland Kitchen If you are having a medical emergency, call 911. . Seek medical care right away. Before you go to a doctor's office, urgent care or emergency department, call ahead and tell them about your recent travel, contact with someone diagnosed with COVID-19, and your symptoms. You should receive instructions from your physician's office regarding next steps of care.  . When you arrive at healthcare provider, tell the healthcare staff immediately you have returned from visiting [place], [place], [place], [place] or [place]; or traveled in the [place] to [place], [place], [place], or [place]; in the last two weeks or you have been in close contact with a person diagnosed with COVID-19 in the last 2 weeks.   . Tell the health care staff about your symptoms: fever, cough and shortness of breath. . After you have been seen by a medical provider, you will be either: o Tested for (COVID-19) and discharged home on quarantine except to seek medical care if symptoms worsen, and asked to  - Stay home and avoid contact with others until you get your results (4-5 days)  - Avoid travel on public transportation if possible (such as bus,  train, or airplane) or o Sent to the Emergency Department by EMS for evaluation, COVID-19 testing, and possible admission depending on your condition and test results.  What to do if you are LOW RISK for COVID-19?  Reduce your risk of any infection by using the same precautions used for avoiding the common cold or flu:  Marland Kitchen Wash your hands often with soap and warm water for at least 20 seconds.  If soap and water are not readily available, use an alcohol-based hand sanitizer with at least 60% alcohol.  . If coughing or  sneezing, cover your mouth and nose by coughing or sneezing into the elbow areas of your shirt or coat, into a tissue or into your sleeve (not your hands). . Avoid shaking hands with others and consider head nods or verbal greetings only. . Avoid touching your eyes, nose, or mouth with unwashed hands.  . Avoid close contact with people who are sick. . Avoid places or events with large numbers of people in one location, like concerts or sporting events. . Carefully consider travel plans you have or are making. . If you are planning any travel outside or inside the [place], visit the CDC's Travelers' Health webpage for the latest health notices. . If you have some symptoms but not all symptoms, continue to monitor at home and seek medical attention if your symptoms worsen. . If you are having a medical emergency, call 911.   [place] / e-Visit: [email or web address]         [facility]: [facility]: [phone]                   [facility]: [phone]           It is flu season:   >>> Best ways to protect herself from the flu: Receive the yearly flu vaccine, practice good hand hygiene washing with soap and also using hand sanitizer when available, eat a nutritious meals, get adequate rest, hydrate appropriately   Please contact the office if your symptoms worsen or you have concerns that you are not improving.   Thank you for choosing  [facility] for your healthcare, and for allowing [place] to partner with you on your healthcare journey. I am thankful to be able to provide care to you today.   Wyn Quaker FNP-C       Sleep Apnea Sleep apnea affects breathing during sleep. It causes breathing to stop for a short time or to become shallow. It can also increase the risk of:  Heart attack.  Stroke.  Being very  overweight (obese).  Diabetes.  Heart failure.  Irregular heartbeat. The goal of treatment is to help you breathe normally again. What are the causes? There are three kinds of sleep apnea:  Obstructive sleep apnea. This is caused by a blocked or collapsed airway.  Central sleep apnea. This happens when the brain does not send the right signals to the muscles that control breathing.  Mixed sleep apnea. This is a combination of obstructive and central sleep apnea. The most common cause of this condition is a collapsed or blocked airway. This can happen if:  Your throat muscles are too relaxed.  Your tongue and tonsils are too large.  You are overweight.  Your airway is too small. What increases the risk?  Being overweight.  Smoking.  Having a small airway.  Being older.  Being female.  Drinking alcohol.  Taking medicines to calm yourself (sedatives or tranquilizers).  Having family members with the condition. What are the signs or symptoms?  Trouble staying asleep.  Being sleepy or tired during the day.  Getting angry a lot.  Loud snoring.  Headaches in the morning.  Not being able to focus your mind (concentrate).  Forgetting things.  Less interest in sex.  Mood swings.  Personality changes.  Feelings of sadness (depression).  Waking up a lot during the night to pee (urinate).  Dry mouth.  Sore throat. How is this diagnosed?  Your medical history.  A physical exam.  A test that is done when you are sleeping (sleep study). The test is most often done in a sleep lab but may also be done at home. How is this treated?   Sleeping on your side.  Using a medicine to get rid of mucus in your nose (decongestant).  Avoiding the use of alcohol, medicines to help you relax, or certain pain medicines (narcotics).  Losing weight, if needed.  Changing your diet.  Not smoking.  Using a machine to open your airway while you sleep, such  as: ? An oral appliance. This is a mouthpiece that shifts your lower jaw forward. ? A CPAP device. This device blows air through a mask when you breathe out (exhale). ? An EPAP device. This has valves that you put in each nostril. ? A BPAP device. This device blows air through a mask when you breathe in (inhale) and breathe out.  Having surgery if other treatments do not work. It is important to get treatment for sleep apnea. Without treatment, it can lead to:  High blood pressure.  Coronary artery disease.  In men, not being able to have an erection (impotence).  Reduced thinking ability. Follow these instructions at home: Lifestyle  Make changes that your doctor recommends.  Eat a healthy diet.  Lose weight if needed.  Avoid alcohol, medicines to help you relax, and some pain medicines.  Do not use any products that contain nicotine or tobacco, such as cigarettes, e-cigarettes, and chewing tobacco. If you need help quitting, ask your doctor. General instructions  Take over-the-counter and prescription medicines only as told by your doctor.  If you were given a machine to use while you sleep, use it only as told by your doctor.  If you are having surgery, make sure to tell your doctor you have sleep apnea. You may need to bring your device with you.  Keep all follow-up visits as told by your doctor. This is important. Contact a doctor if:  The machine that you were given to use during sleep bothers you or does not seem to be working.  You do not get better.  You get worse. Get help right away if:  Your chest hurts.  You have trouble breathing in enough air.  You have an uncomfortable feeling in your back, arms, or stomach.  You have trouble talking.  One side of your body feels weak.  A part of your face is hanging down. These symptoms may be an emergency. Do not wait to see if the symptoms will go away. Get medical help right away. Call your local emergency  services (911 in the [place] Do not drive yourself to the hospital. Summary  This condition affects breathing during sleep.  The most common cause is a collapsed or blocked airway.  The goal of treatment is to help you breathe normally while you sleep. This  information is not intended to replace advice given to you by your health care provider. Make sure you discuss any questions you have with your health care provider. Document Released: 08/20/2008 Document Revised: 07/07/2018 Document Reviewed: 07/07/2018 Elsevier Interactive Patient Education  2019 [employer].        CPAP and BPAP Information CPAP and BPAP are methods of helping a person breathe with the use of air pressure. CPAP stands for "continuous positive airway pressure." BPAP stands for "bi-level positive airway pressure." In both methods, air is blown through your nose or mouth and into your air passages to help you breathe well. CPAP and BPAP use different amounts of pressure to blow air. With CPAP, the amount of pressure stays the same while you breathe in and out. With BPAP, the amount of pressure is increased when you breathe in (inhale) so that you can take larger breaths. Your health care provider will recommend whether CPAP or BPAP would be more helpful for you. Why are CPAP and BPAP treatments used? CPAP or BPAP can be helpful if you have:  Sleep apnea.  Chronic obstructive pulmonary disease (COPD).  Heart failure.  Medical conditions that weaken the muscles of the chest including muscular dystrophy, or neurological diseases such as amyotrophic lateral sclerosis (ALS).  Other problems that cause breathing to be weak, abnormal, or difficult. CPAP is most commonly used for obstructive sleep apnea (OSA) to keep the airways from collapsing when the muscles relax during sleep. How is CPAP or BPAP administered? Both CPAP and BPAP are provided by a small machine with a flexible plastic tube that attaches to a plastic  mask. You wear the mask. Air is blown through the mask into your nose or mouth. The amount of pressure that is used to blow the air can be adjusted on the machine. Your health care provider will determine the pressure setting that should be used based on your individual needs. When should CPAP or BPAP be used? In most cases, the mask only needs to be worn during sleep. Generally, the mask needs to be worn throughout the night and during any daytime naps. People with certain medical conditions may also need to wear the mask at other times when they are awake. Follow instructions from your health care provider about when to use the machine. What are some tips for using the mask?   Because the mask needs to be snug, some people feel trapped or closed-in (claustrophobic) when first using the mask. If you feel this way, you may need to get used to the mask. One way to do this is by holding the mask loosely over your nose or mouth and then gradually applying the mask more snugly. You can also gradually increase the amount of time that you use the mask.  Masks are available in various types and sizes. Some fit over your mouth and nose while others fit over just your nose. If your mask does not fit well, talk with your health care provider about getting a different one.  If you are using a mask that fits over your nose and you tend to breathe through your mouth, a chin strap may be applied to help keep your mouth closed.  The CPAP and BPAP machines have alarms that may sound if the mask comes off or develops a leak.  If you have trouble with the mask, it is very important that you talk with your health care provider about finding a way to make the mask easier  to tolerate. Do not stop using the mask. Stopping the use of the mask could have a negative impact on your health. What are some tips for using the machine?  Place your CPAP or BPAP machine on a secure table or stand near an electrical outlet.  Know  where the on/off switch is located on the machine.  Follow instructions from your health care provider about how to set the pressure on your machine and when you should use it.  Do not eat or drink while the CPAP or BPAP machine is on. Food or fluids could get pushed into your lungs by the pressure of the CPAP or BPAP.  Do not smoke. Tobacco smoke residue can damage the machine.  For home use, CPAP and BPAP machines can be rented or purchased through home health care companies. Many different brands of machines are available. Renting a machine before purchasing may help you find out which particular machine works well for you.  Keep the CPAP or BPAP machine and attachments clean. Ask your health care provider for specific instructions. Get help right away if:  You have redness or open areas around your nose or mouth where the mask fits.  You have trouble using the CPAP or BPAP machine.  You cannot tolerate wearing the CPAP or BPAP mask.  You have pain, discomfort, and bloating in your abdomen. Summary  CPAP and BPAP are methods of helping a person breathe with the use of air pressure.  Both CPAP and BPAP are provided by a small machine with a flexible plastic tube that attaches to a plastic mask.  If you have trouble with the mask, it is very important that you talk with your health care provider about finding a way to make the mask easier to tolerate. This information is not intended to replace advice given to you by your health care provider. Make sure you discuss any questions you have with your health care provider. Document Released: 08/09/2004 Document Revised: 07/14/2018 Document Reviewed: 09/30/2016 Elsevier Interactive Patient Education  2019 [employer].    Gastroesophageal Reflux Disease, Adult Gastroesophageal reflux (GER) happens when acid from the stomach flows up into the tube that connects the mouth and the stomach (esophagus). Normally, food travels down the  esophagus and stays in the stomach to be digested. With GER, food and stomach acid sometimes move back up into the esophagus. You may have a disease called gastroesophageal reflux disease (GERD) if the reflux:  Happens often.  Causes frequent or very bad symptoms.  Causes problems such as damage to the esophagus. When this happens, the esophagus becomes sore and swollen (inflamed). Over time, GERD can make small holes (ulcers) in the lining of the esophagus. What are the causes? This condition is caused by a problem with the muscle between the esophagus and the stomach. When this muscle is weak or not normal, it does not close properly to keep food and acid from coming back up from the stomach. The muscle can be weak because of:  Tobacco use.  Pregnancy.  Having a certain type of hernia (hiatal hernia).  Alcohol use.  Certain foods and drinks, such as coffee, chocolate, onions, and peppermint. What increases the risk? You are more likely to develop this condition if you:  Are overweight.  Have a disease that affects your connective tissue.  Use NSAID medicines. What are the signs or symptoms? Symptoms of this condition include:  Heartburn.  Difficult or painful swallowing.  The feeling of having a  lump in the throat.  A bitter taste in the mouth.  Bad breath.  Having a lot of saliva.  Having an upset or bloated stomach.  Belching.  Chest pain. Different conditions can cause chest pain. Make sure you see your doctor if you have chest pain.  Shortness of breath or noisy breathing (wheezing).  Ongoing (chronic) cough or a cough at night.  Wearing away of the surface of teeth (tooth enamel).  Weight loss. How is this treated? Treatment will depend on how bad your symptoms are. Your doctor may suggest:  Changes to your diet.  Medicine.  Surgery. Follow these instructions at home: Eating and drinking   Follow a diet as told by your doctor. You may need to  avoid foods and drinks such as: ? Coffee and tea (with or without caffeine). ? Drinks that contain alcohol. ? Energy drinks and sports drinks. ? Bubbly (carbonated) drinks or sodas. ? Chocolate and cocoa. ? Peppermint and mint flavorings. ? Garlic and onions. ? Horseradish. ? Spicy and acidic foods. These include peppers, chili powder, curry powder, vinegar, hot sauces, and BBQ sauce. ? Citrus fruit juices and citrus fruits, such as oranges, lemons, and limes. ? Tomato-based foods. These include red sauce, chili, salsa, and pizza with red sauce. ? Fried and fatty foods. These include donuts, french fries, potato chips, and high-fat dressings. ? High-fat meats. These include hot dogs, rib eye steak, sausage, ham, and bacon. ? High-fat dairy items, such as whole milk, butter, and cream cheese.  Eat small meals often. Avoid eating large meals.  Avoid drinking large amounts of liquid with your meals.  Avoid eating meals during the 2-3 hours before bedtime.  Avoid lying down right after you eat.  Do not exercise right after you eat. Lifestyle   Do not use any products that contain nicotine or tobacco. These include cigarettes, e-cigarettes, and chewing tobacco. If you need help quitting, ask your doctor.  Try to lower your stress. If you need help doing this, ask your doctor.  If you are overweight, lose an amount of weight that is healthy for you. Ask your doctor about a safe weight loss goal. General instructions  Pay attention to any changes in your symptoms.  Take over-the-counter and prescription medicines only as told by your doctor. Do not take aspirin, ibuprofen, or other NSAIDs unless your doctor says it is okay.  Wear loose clothes. Do not wear anything tight around your waist.  Raise (elevate) the head of your bed about 6 inches (15 cm).  Avoid bending over if this makes your symptoms worse.  Keep all follow-up visits as told by your doctor. This is  important. Contact a doctor if:  You have new symptoms.  You lose weight and you do not know why.  You have trouble swallowing or it hurts to swallow.  You have wheezing or a cough that keeps happening.  Your symptoms do not get better with treatment.  You have a hoarse voice. Get help right away if:  You have pain in your arms, neck, jaw, teeth, or back.  You feel sweaty, dizzy, or light-headed.  You have chest pain or shortness of breath.  You throw up (vomit) and your throw-up looks like blood or coffee grounds.  You pass out (faint).  Your poop (stool) is bloody or black.  You cannot swallow, drink, or eat. Summary  If a person has gastroesophageal reflux disease (GERD), food and stomach acid move back up into the esophagus  and cause symptoms or problems such as damage to the esophagus.  Treatment will depend on how bad your symptoms are.  Follow a diet as told by your doctor.  Take all medicines only as told by your doctor. This information is not intended to replace advice given to you by your health care provider. Make sure you discuss any questions you have with your health care provider. Document Released: 04/29/2008 Document Revised: 05/20/2018 Document Reviewed: 05/20/2018 Elsevier Interactive Patient Education  2019 [place] for Gastroesophageal Reflux Disease, Adult When you have gastroesophageal reflux disease (GERD), the foods you eat and your eating habits are very important. Choosing the right foods can help ease your discomfort. Think about working with a nutrition specialist (dietitian) to help you make good choices. What are tips for following this plan?  Meals  Choose healthy foods that are low in fat, such as fruits, vegetables, whole grains, low-fat dairy products, and lean meat, fish, and poultry.  Eat small meals often instead of 3 large meals a day. Eat your meals slowly, and in a place where you are relaxed. Avoid  bending over or lying down until 2-3 hours after eating.  Avoid eating meals 2-3 hours before bed.  Avoid drinking a lot of liquid with meals.  Cook foods using methods other than frying. Bake, grill, or broil food instead.  Avoid or limit: ? Chocolate. ? Peppermint or spearmint. ? Alcohol. ? Pepper. ? Black and decaffeinated coffee. ? Black and decaffeinated tea. ? Bubbly (carbonated) soft drinks. ? Caffeinated energy drinks and soft drinks.  Limit high-fat foods such as: ? Fatty meat or fried foods. ? Whole milk, cream, butter, or ice cream. ? Nuts and nut butters. ? Pastries, donuts, and sweets made with butter or shortening.  Avoid foods that cause symptoms. These foods may be different for everyone. Common foods that cause symptoms include: ? Tomatoes. ? Oranges, lemons, and limes. ? Peppers. ? Spicy food. ? Onions and garlic. ? Vinegar. Lifestyle  Maintain a healthy weight. Ask your doctor what weight is healthy for you. If you need to lose weight, work with your doctor to do so safely.  Exercise for at least 30 minutes for 5 or more days each week, or as told by your doctor.  Wear loose-fitting clothes.  Do not smoke. If you need help quitting, ask your doctor.  Sleep with the head of your bed higher than your feet. Use a wedge under the mattress or blocks under the bed frame to raise the head of the bed. Summary  When you have gastroesophageal reflux disease (GERD), food and lifestyle choices are very important in easing your symptoms.  Eat small meals often instead of 3 large meals a day. Eat your meals slowly, and in a place where you are relaxed.  Limit high-fat foods such as fatty meat or fried foods.  Avoid bending over or lying down until 2-3 hours after eating.  Avoid peppermint and spearmint, caffeine, alcohol, and chocolate. This information is not intended to replace advice given to you by your health care provider. Make sure you discuss any  questions you have with your health care provider. Document Released: 05/12/2012 Document Revised: 12/17/2016 Document Reviewed: 12/17/2016 Elsevier Interactive Patient Education  2019 [employer].

## 2019-03-09 NOTE — Assessment & Plan Note (Signed)
P:  Continue eliquis

## 2019-03-14 DIAGNOSIS — G4733 Obstructive sleep apnea (adult) (pediatric): Secondary | ICD-10-CM | POA: Diagnosis not present

## 2019-03-14 DIAGNOSIS — I2602 Saddle embolus of pulmonary artery with acute cor pulmonale: Secondary | ICD-10-CM | POA: Diagnosis not present

## 2019-03-14 DIAGNOSIS — I2729 Other secondary pulmonary hypertension: Secondary | ICD-10-CM | POA: Diagnosis not present

## 2019-03-23 DIAGNOSIS — Z961 Presence of intraocular lens: Secondary | ICD-10-CM | POA: Diagnosis not present

## 2019-03-23 DIAGNOSIS — Z79899 Other long term (current) drug therapy: Secondary | ICD-10-CM | POA: Diagnosis not present

## 2019-03-23 DIAGNOSIS — H30033 Focal chorioretinal inflammation, peripheral, bilateral: Secondary | ICD-10-CM | POA: Diagnosis not present

## 2019-03-23 DIAGNOSIS — H3581 Retinal edema: Secondary | ICD-10-CM | POA: Diagnosis not present

## 2019-04-01 DIAGNOSIS — N183 Chronic kidney disease, stage 3 (moderate): Secondary | ICD-10-CM | POA: Diagnosis not present

## 2019-04-13 DIAGNOSIS — G4733 Obstructive sleep apnea (adult) (pediatric): Secondary | ICD-10-CM | POA: Diagnosis not present

## 2019-04-13 DIAGNOSIS — I2602 Saddle embolus of pulmonary artery with acute cor pulmonale: Secondary | ICD-10-CM | POA: Diagnosis not present

## 2019-04-13 DIAGNOSIS — I2729 Other secondary pulmonary hypertension: Secondary | ICD-10-CM | POA: Diagnosis not present

## 2019-04-23 ENCOUNTER — Other Ambulatory Visit: Payer: Self-pay | Admitting: Adult Health

## 2019-05-14 DIAGNOSIS — I2729 Other secondary pulmonary hypertension: Secondary | ICD-10-CM | POA: Diagnosis not present

## 2019-05-14 DIAGNOSIS — G4733 Obstructive sleep apnea (adult) (pediatric): Secondary | ICD-10-CM | POA: Diagnosis not present

## 2019-05-14 DIAGNOSIS — I2602 Saddle embolus of pulmonary artery with acute cor pulmonale: Secondary | ICD-10-CM | POA: Diagnosis not present

## 2019-06-01 DIAGNOSIS — H30033 Focal chorioretinal inflammation, peripheral, bilateral: Secondary | ICD-10-CM | POA: Diagnosis not present

## 2019-06-01 DIAGNOSIS — H3581 Retinal edema: Secondary | ICD-10-CM | POA: Diagnosis not present

## 2019-06-01 DIAGNOSIS — Z961 Presence of intraocular lens: Secondary | ICD-10-CM | POA: Diagnosis not present

## 2019-06-01 DIAGNOSIS — Z79899 Other long term (current) drug therapy: Secondary | ICD-10-CM | POA: Diagnosis not present

## 2019-06-13 DIAGNOSIS — G4733 Obstructive sleep apnea (adult) (pediatric): Secondary | ICD-10-CM | POA: Diagnosis not present

## 2019-06-13 DIAGNOSIS — I2729 Other secondary pulmonary hypertension: Secondary | ICD-10-CM | POA: Diagnosis not present

## 2019-06-13 DIAGNOSIS — I2602 Saddle embolus of pulmonary artery with acute cor pulmonale: Secondary | ICD-10-CM | POA: Diagnosis not present

## 2019-07-02 ENCOUNTER — Telehealth: Payer: Self-pay | Admitting: Internal Medicine

## 2019-07-02 MED ORDER — ALBUTEROL SULFATE HFA 108 (90 BASE) MCG/ACT IN AERS: 2.0000 | INHALATION_SPRAY | Freq: Four times a day (QID) | RESPIRATORY_TRACT | 2 refills | Status: AC | PRN

## 2019-07-02 NOTE — Telephone Encounter (Signed)
Called and spoke with pt stating to her the info provided by Aaron Edelman. Stated to pt that we needed to get her scheduled for a f/u and pt verbalized understanding. Stated to pt that we were going to refill her albuterol inhaler for her and also let her know if her symptoms worsened prior to her OV or if she began having any CP/ heart palpitations with SOB to go to ER. Pt expressed understanding. appt scheduled for pt with BM Mon 8/10 at 11:30 and Rx has been sent to pt's preferred pharmacy. Nothing further needed.

## 2019-07-02 NOTE — Telephone Encounter (Signed)
Primary Pulmonologist: MR Last office visit and with whom: 03/09/2019 with BPM What do we see them for (pulmonary problems): GERD, OSA, Chronic saddle pulmonary embolism, Morbid obesity  Reason for call: Pt reports wheezing off and on accompanied by increased shortness of breath for a few weeks now. She denies fever/chills/muscle aches. States she had mild chest tightness this morning resolved by coughing up her usual morning phlegm. Pt states she is concerned bc she had a pulmonary embolism last year and had similar symptoms then. She reports taking her blood thinner Eliquis 5 mg twice daily. States she is out of her albuterol rescue inhaler. Denies taking any other breathing medications and/or OTC remedies. Pt states she uses CPAP with no complaints, occasionally falls alseep without it, but tries to use it nightly.   In the last month, have you been in contact with someone who was confirmed or suspected to have [place]avirus / COVID-19?  No  Do you have any of the following symptoms developed in the last 30 days? Fever: None Cough: None Shortness of breath: Yes  When did your symptoms start?  A few weeks ago  If the patient has a fever, what is the last reading?  (use n/a if patient denies fever)  N/A . IF THE PATIENT STATES THEY DO NOT OWN A THERMOMETER, THEY MUST GO AND PURCHASE ONE When did the fever start?: N/A Have you taken any medication to suppress a fever (ie Ibuprofen, Aleve, Tylenol)?: N/A  Since MR is not currently in the office, I am routing this message to BPM, who saw pt recently. Aaron Edelman, please advise with your recommendations for this pt. Thank you.

## 2019-07-02 NOTE — Telephone Encounter (Signed)
Doubt that this is a PE.  It is also reassuring the patient is still taking her Eliquis.  Still we should still get patient in for close follow-up.  Please schedule the patient for a follow-up with me next week.  Okay to send prescription for albuterol rescue inhaler since she is out of this.Please explained to the patient that if symptoms worsen or she develops chest pain or heart palpitations with her shortness of breath and she will need to present to an emergency room for further evaluation.Wyn Quaker, FNP

## 2019-07-04 NOTE — Progress Notes (Signed)
@Patient  ID: Tricia Harrison, female    DOB: 1946/01/20, 73 y.o.   MRN: [id number]  Chief Complaint  Patient presents with  . Follow-up    breathing 'better' - using albuterol - less wheezing    Referring provider: Dorthy Cooler, [place], MD  HPI:  73 year old female former smoker followed in our office for pulmonary emboli, pulmonary hypertension, and OSA  PMH: Hypothyroidism, morbid obesity, type 2 diabetes, aortic regurgitation, GERD, degenerative joint disease, hypertension Smoker/ Smoking History: Former smoker.  10-pack-year smoking history.  Quit 1995. Maintenance:  none Pt of: Dr. Chase Caller for Pulm, Dr. Ander Slade for Sleep   07/05/2019  - Visit   73 year old female former smoker followed in our office for history of pulmonary embolism managed on Eliquis and obstructive sleep apnea.  Managed on CPAP.  Patient contacted our office last week because she said she has worsening shortness of breath, wheezing as well as chest pain had concerns that she may have had pulmonary emboli.  Patient has been maintained on Eliquis and has not missed any doses.  Patient also need a refill of her rescue inhaler which we refilled over the phone last week and scheduled office visit to follow-up closely.  Patient reports that since obtaining a rescue inhaler she had to use it 2 times on Saturday and 2 times on Sunday and she reports her breathing has significantly improved.  She was having upper airway wheeze.  Vital signs today are stable.  Patient reports that she has been breathing better now.  She is tolerating her CPAP but does not enjoy using it.  She does not like sleeping on her back.  CPAP compliance report shows okay compliance.:  06/02/2019-07/01/2019 - 20/30 days used, 20 those days greater than 4 hours, average usage 5 hours and 56 minutes, APAP setting 5-15, AHI 4.8   Tests:   11/23/2018-VQ scan- low probability of PE  11/23/2018- chest x-ray- negative for acute cardiopulmonary disease   08/20/2018-CT Angio- positive for bilateral nonocclusive pulmonary emboli with CT evidence of right heart strain, no pneumonia or effusion seen  11/16/2018-echocardiogram- left ventricular LV ejection fraction 60 to 40%, grade 1 diastolic dysfunction, pulmonary arteries PAP pressure 38  08/19/2018-echocardiogram-LV ejection fraction 55 to 98%, grade 1 diastolic dysfunction, PAP pressure 71  November/2019- ono on room air-only below 88% for 6 seconds  12/17/2018-Home sleep study-AHI 11.9, SaO2 low 86%  SIX MIN WALK 07/05/2019  Supplimental Oxygen during Test? (L/min) No  Tech Comments: moderate walking pace with walker - rested briefly last lap due to back pain and mild c/o SOB - able to complete walk - no desaturation.  Joella Prince RN    FENO:  No results found for: NITRICOXIDE  PFT: No flowsheet data found.  Imaging: No results found.    Specialty Problems      Pulmonary Problems   DOE (dyspnea on exertion)   Obstructive sleep apnea    12/17/2018-Home sleep study-AHI 11.9, SaO2 low 86%         Allergies  Allergen Reactions  . Atorvastatin Other (See Comments)    Muscle pain  . Codeine Nausea And Vomiting  . Morphine And Related     B/p drops     Immunization History  Administered Date(s) Administered  . Influenza, High Dose Seasonal PF 08/20/2018    Past Medical History:  Diagnosis Date  . Anemia    as a child  . Asthma    as a child  . Chronic back pain    spinal stenosis  and buldging disc;scoliosis  . Chronic constipation    occasionally takes something OTC  . Diabetes mellitus without complication ([place])    per pt "Pre" for 20+yrs  . Diverticulosis   . Gallstones    has known about this for 3-96yrs  . GERD (gastroesophageal reflux disease)    takes Omeprazole daily  . H/O hiatal hernia   . Hemorrhoids   . History of blood transfusion    no abnormal reaction noted  . History of bronchitis    states its been a long time ago  . History of gout    . HLD (hyperlipidemia)    takes Fenofibrate daily  . HTN (hypertension)    takes Diltiazem and Ramipril daily  . Hypothyroidism    takes Synthroid daily  . Left knee DJD   . Nocturia   . Palpitations    started in 2013 and only occasionally;last time noticed about 2-3wks ago and after drinking caffeine  . PONV (postoperative nausea and vomiting)   . 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    Tobacco History: Social History   Tobacco Use  Smoking Status Former Smoker  . Packs/day: 0.50  . Years: 20.00  . Pack years: 10.00  . Types: Cigarettes  . Start date: 09/04/1964  . Quit date: 11/25/1993  . Years since quitting: 25.6  Smokeless Tobacco Never Used  Tobacco Comment   quit smoking in 1995   Counseling given: Yes Comment: quit smoking in 1995   Continue to not smoke  Outpatient Encounter Medications as of 07/05/2019  Medication Sig  . acetaminophen (TYLENOL) 500 MG tablet Take 1,000 mg by mouth every 6 (six) hours as needed for mild pain.  Marland Kitchen albuterol (VENTOLIN HFA) 108 (90 Base) MCG/ACT inhaler Inhale 2 puffs into the lungs every 6 (six) hours as needed.  . carvedilol (COREG) 6.25 MG tablet TAKE 1 TABLET BY MOUTH TWO  TIMES DAILY WITH A MEAL  . Cholecalciferol (VITAMIN D3) 2000 UNITS TABS Take 2,000 mg by mouth daily.  Marland Kitchen diltiazem (CARDIZEM LA) 360 MG 24 hr tablet Take 360 mg by mouth daily.  . DUREZOL 0.05 % EMUL Place 1 drop into both eyes daily.  Marland Kitchen ELIQUIS 5 MG TABS tablet TAKE 1 TABLET BY MOUTH TWO  TIMES DAILY  . fenofibrate (TRICOR) 145 MG tablet Take 145 mg by mouth daily.  Marland Kitchen ketorolac (ACULAR) 0.5 % ophthalmic solution Place 1 drop into both eyes 3 (three) times daily.  Marland Kitchen levothyroxine (SYNTHROID, LEVOTHROID) 50 MCG tablet Take 50 mcg by mouth daily before breakfast.  . magnesium hydroxide (MILK OF MAGNESIA) 800 MG/5ML suspension Take 15  mLs by mouth daily as needed for constipation.  Marland Kitchen omeprazole (PRILOSEC) 20 MG capsule Take 20 mg by mouth daily.  . rosuvastatin (CRESTOR) 20 MG tablet TAKE 1 TABLET BY MOUTH  DAILY AT 6 PM.  . [DISCONTINUED] bisacodyl (DULCOLAX) 5 MG EC tablet Take 2 tablets every night with dinner until bowel movement.  LAXITIVE.  Restart if two days since last bowel movement   No facility-administered encounter medications on file as of 07/05/2019.      Review of Systems  Review of Systems  Constitutional: Positive for fatigue. Negative for activity change and fever.  HENT: Negative for sinus pressure, sinus pain and sore throat.   Respiratory: Positive for wheezing. Negative for cough and shortness of breath.  Cardiovascular: Negative for chest pain, palpitations and leg swelling.  Gastrointestinal: Negative for diarrhea, nausea and vomiting.  Musculoskeletal: Negative for arthralgias.  Neurological: Negative for dizziness.  Psychiatric/Behavioral: Negative for sleep disturbance. The patient is not nervous/anxious.      Physical Exam  BP 136/78 (BP Location: Right Arm, Patient Position: Sitting, Cuff Size: Normal)   Pulse 63   Temp 98.3 F (36.8 C)   Ht 5\' 1"  (1.549 m)   Wt 199 lb 6.4 oz (90.4 kg)   SpO2 97%   BMI 37.68 kg/m   Wt Readings from Last 5 Encounters:  07/05/19 199 lb 6.4 oz (90.4 kg)  12/03/18 208 lb (94.3 kg)  12/02/18 209 lb 6.4 oz (95 kg)  11/09/18 211 lb 11.2 oz (96 kg)  09/04/18 209 lb (94.8 kg)     Physical Exam Vitals signs and nursing note reviewed.  Constitutional:      General: She is not in acute distress.    Appearance: Normal appearance. She is obese.  HENT:     Head: Normocephalic and atraumatic.     Right Ear: Tympanic membrane, ear canal and external ear normal. There is no impacted cerumen.     Left Ear: Tympanic membrane, ear canal and external ear normal. There is no impacted cerumen.     Nose: Nose normal. No congestion.     Mouth/Throat:      Mouth: Mucous membranes are moist.     Pharynx: Oropharynx is clear.  Eyes:     Pupils: Pupils are equal, round, and reactive to light.  Neck:     Musculoskeletal: Normal range of motion.  Cardiovascular:     Rate and Rhythm: Normal rate and regular rhythm.     Pulses: Normal pulses.     Heart sounds: Normal heart sounds. No murmur.  Pulmonary:     Effort: Pulmonary effort is normal. No respiratory distress.     Breath sounds: Normal breath sounds. No decreased air movement. No decreased breath sounds, wheezing or rales.  Skin:    General: Skin is warm and dry.     Capillary Refill: Capillary refill takes less than 2 seconds.  Neurological:     General: No focal deficit present.     Mental Status: She is alert and oriented to person, place, and time. Mental status is at baseline.     Gait: Gait normal.  Psychiatric:        Mood and Affect: Mood normal.        Behavior: Behavior normal.        Thought Content: Thought content normal.        Judgment: Judgment normal.      Lab Results:  CBC    Component Value Date/Time   WBC 12.0 (H) 10/08/2018 0929   WBC 8.2 08/23/2018 0406   RBC 3.81 10/08/2018 0929   RBC 3.20 (L) 08/23/2018 0406   HGB 11.5 10/08/2018 0929   HCT 33.3 (L) 10/08/2018 0929   PLT 274 10/08/2018 0929   MCV 87 10/08/2018 0929   MCH 30.2 10/08/2018 0929   MCH 30.3 08/23/2018 0406   MCHC 34.5 10/08/2018 0929   MCHC 32.0 08/23/2018 0406   RDW 13.7 10/08/2018 0929    BMET    Component Value Date/Time   NA 142 10/08/2018 0929   K 4.0 10/08/2018 0929   CL 104 10/08/2018 0929   CO2 23 10/08/2018 0929   GLUCOSE 83 10/08/2018 0929   GLUCOSE 105 (H) 08/19/2018 0638   BUN  28 (H) 10/08/2018 0929   CREATININE 1.28 (H) 10/08/2018 0929   CALCIUM 9.5 10/08/2018 0929   GFRNONAA 42 (L) 10/08/2018 0929   GFRAA 48 (L) 10/08/2018 0929    BNP No results found for: BNP  ProBNP No results found for: PROBNP    Assessment & Plan:   Obstructive sleep apnea  Plan: Continue CPAP therapy Follow-up in 2 months with an in office visit with Dr. Ander Slade in a pulmonary function test Likely should establish with Dr. Ander Slade at that time  Morbid obesity [facility]) Plan: Continue to work on reducing weight and BMI  Pulmonary embolism ([place]) Plan: Continue Eliquis    Return in about 2 months (around 09/04/2019), or if symptoms worsen or fail to improve, for Follow up with Dr. Ander Slade.   Lauraine Rinne, NP 07/05/2019   This appointment was 28 minutes long with over 50% of the time in direct face-to-face patient care, assessment, plan of care, and follow-up.

## 2019-07-05 ENCOUNTER — Ambulatory Visit: Payer: Medicare HMO | Admitting: Pulmonary Disease

## 2019-07-05 ENCOUNTER — Other Ambulatory Visit: Payer: Self-pay

## 2019-07-05 ENCOUNTER — Encounter: Payer: Self-pay | Admitting: Pulmonary Disease

## 2019-07-05 VITALS — BP 136/78 | HR 63 | Temp 98.3°F | Ht 61.0 in | Wt 199.4 lb

## 2019-07-05 DIAGNOSIS — I2602 Saddle embolus of pulmonary artery with acute cor pulmonale: Secondary | ICD-10-CM | POA: Diagnosis not present

## 2019-07-05 DIAGNOSIS — G4733 Obstructive sleep apnea (adult) (pediatric): Secondary | ICD-10-CM

## 2019-07-05 DIAGNOSIS — I2729 Other secondary pulmonary hypertension: Secondary | ICD-10-CM

## 2019-07-05 DIAGNOSIS — I2782 Chronic pulmonary embolism: Secondary | ICD-10-CM

## 2019-07-05 NOTE — Patient Instructions (Addendum)
Walk today   Only use your albuterol as a rescue medication to be used if you can't catch your breath by resting or doing a relaxed purse lip breathing pattern.  - The less you use it, the better it will work when you need it. - Ok to use up to 2 puffs  every 4 hours if you must but call for immediate appointment if use goes up over your usual need - Don't leave home without it !!  (think of it like the spare tire for your car)    Continue CPAP use   We recommend that you continue using your CPAP daily >>>Keep up the hard work using your device >>> Goal should be wearing this for the entire night that you are sleeping, at least 4 to 6 hours  Remember:   Do not drive or operate heavy machinery if tired or drowsy.   Please notify the supply company and office if you are unable to use your device regularly due to missing supplies or machine being broken.   Work on maintaining a healthy weight and following your recommended nutrition plan   Maintain proper daily exercise and movement   Maintaining proper use of your device can also help improve management of other chronic illnesses such as: Blood pressure, blood sugars, and weight management.   BiPAP/ CPAP Cleaning:  >>>Clean weekly, with Dawn soap, and bottle brush.  Set up to air dry.   Omeprazole 20 mg tablet  >>>Please take 1 tablet daily 15 minutes to 30 minutes before your first meal of the day as well as before your other medications >>>Try to take at the same time each day >>>take this medication daily  GERD management: >>>Avoid laying flat until 2 hours after meals >>>Elevate head of the bed including entire chest >>>Reduce size of meals and amount of fat, acid, spices, caffeine and sweets >>>If you are smoking, Please stop! >>>Decrease alcohol consumption >>>Work on maintaining a healthy weight with normal BMI    Return in about 2 months (around 09/04/2019), or if symptoms worsen or fail to improve, for  Follow up with Dr. Ander Slade.    Coronavirus (COVID-19) Are you at risk?  Are you at risk for the Coronavirus (COVID-19)?  To be considered HIGH RISK for Coronavirus (COVID-19), you have to meet the following criteria:  . Traveled to [place], [place], [place], [place] or [place]; or in the [place] to [place], [place], [place], or [place]; and have fever, cough, and shortness of breath within the last 2 weeks of travel OR . Been in close contact with a person diagnosed with COVID-19 within the last 2 weeks and have fever, cough, and shortness of breath . IF YOU DO NOT MEET THESE CRITERIA, YOU ARE CONSIDERED LOW RISK FOR COVID-19.  What to do if you are HIGH RISK for COVID-19?  Marland Kitchen If you are having a medical emergency, call 911. . Seek medical care right away. Before you go to a doctor's office, urgent care or emergency department, call ahead and tell them about your recent travel, contact with someone diagnosed with COVID-19, and your symptoms. You should receive instructions from your physician's office regarding next steps of care.  . When you arrive at healthcare provider, tell the healthcare staff immediately you have returned from visiting [place], [place], [place], [place] or [place]; or traveled in the [place] to [place], [place], [place], or [place]; in the last two weeks or you have been  in close contact with a person diagnosed with COVID-19 in the last 2 weeks.   . Tell the health care staff about your symptoms: fever, cough and shortness of breath. . After you have been seen by a medical provider, you will be either: o Tested for (COVID-19) and discharged home on quarantine except to seek medical care if symptoms worsen, and asked to  - Stay home and avoid contact with others until you get your results (4-5 days)  - Avoid travel on public transportation if possible (such as bus, train, or airplane) or o Sent to the Emergency Department by EMS for evaluation,  COVID-19 testing, and possible admission depending on your condition and test results.  What to do if you are LOW RISK for COVID-19?  Reduce your risk of any infection by using the same precautions used for avoiding the common cold or flu:  Marland Kitchen Wash your hands often with soap and warm water for at least 20 seconds.  If soap and water are not readily available, use an alcohol-based hand sanitizer with at least 60% alcohol.  . If coughing or sneezing, cover your mouth and nose by coughing or sneezing into the elbow areas of your shirt or coat, into a tissue or into your sleeve (not your hands). . Avoid shaking hands with others and consider head nods or verbal greetings only. . Avoid touching your eyes, nose, or mouth with unwashed hands.  . Avoid close contact with people who are sick. . Avoid places or events with large numbers of people in one location, like concerts or sporting events. . Carefully consider travel plans you have or are making. . If you are planning any travel outside or inside the [place], visit the CDC's Travelers' Health webpage for the latest health notices. . If you have some symptoms but not all symptoms, continue to monitor at home and seek medical attention if your symptoms worsen. . If you are having a medical emergency, call 911.   [place] / e-Visit: [email or web address]         [facility]: [facility]: [phone]                   [facility]: [phone]           It is flu season:   >>> Best ways to protect herself from the flu: Receive the yearly flu vaccine, practice good hand hygiene washing with soap and also using hand sanitizer when available, eat a nutritious meals, get adequate rest, hydrate appropriately   Please contact the office if your symptoms worsen or you have concerns that you are not  improving.   Thank you for choosing [facility] for your healthcare, and for allowing [place] to partner with you on your healthcare journey. I am thankful to be able to provide care to you today.   Wyn Quaker FNP-C

## 2019-07-05 NOTE — Assessment & Plan Note (Signed)
Plan: Continue to work on reducing weight and BMI

## 2019-07-05 NOTE — Assessment & Plan Note (Signed)
Plan: Continue CPAP therapy Follow-up in 2 months with an in office visit with Dr. Ander Slade in a pulmonary function test Likely should establish with Dr. Ander Slade at that time

## 2019-07-05 NOTE — Assessment & Plan Note (Signed)
Plan: Continue Eliquis 

## 2019-07-13 DIAGNOSIS — H3581 Retinal edema: Secondary | ICD-10-CM | POA: Diagnosis not present

## 2019-07-13 DIAGNOSIS — Z79899 Other long term (current) drug therapy: Secondary | ICD-10-CM | POA: Diagnosis not present

## 2019-07-13 DIAGNOSIS — H44113 Panuveitis, bilateral: Secondary | ICD-10-CM | POA: Diagnosis not present

## 2019-07-13 DIAGNOSIS — H30033 Focal chorioretinal inflammation, peripheral, bilateral: Secondary | ICD-10-CM | POA: Diagnosis not present

## 2019-07-13 DIAGNOSIS — Z961 Presence of intraocular lens: Secondary | ICD-10-CM | POA: Diagnosis not present

## 2019-07-14 DIAGNOSIS — I2729 Other secondary pulmonary hypertension: Secondary | ICD-10-CM | POA: Diagnosis not present

## 2019-07-14 DIAGNOSIS — I2602 Saddle embolus of pulmonary artery with acute cor pulmonale: Secondary | ICD-10-CM | POA: Diagnosis not present

## 2019-07-14 DIAGNOSIS — G4733 Obstructive sleep apnea (adult) (pediatric): Secondary | ICD-10-CM | POA: Diagnosis not present

## 2019-07-23 ENCOUNTER — Other Ambulatory Visit: Payer: Self-pay | Admitting: Family Medicine

## 2019-07-23 DIAGNOSIS — Z1231 Encounter for screening mammogram for malignant neoplasm of breast: Secondary | ICD-10-CM

## 2019-08-13 DIAGNOSIS — H1132 Conjunctival hemorrhage, left eye: Secondary | ICD-10-CM | POA: Diagnosis not present

## 2019-08-13 DIAGNOSIS — H44113 Panuveitis, bilateral: Secondary | ICD-10-CM | POA: Diagnosis not present

## 2019-08-13 DIAGNOSIS — Z961 Presence of intraocular lens: Secondary | ICD-10-CM | POA: Diagnosis not present

## 2019-08-13 DIAGNOSIS — H30033 Focal chorioretinal inflammation, peripheral, bilateral: Secondary | ICD-10-CM | POA: Diagnosis not present

## 2019-08-13 DIAGNOSIS — Z79899 Other long term (current) drug therapy: Secondary | ICD-10-CM | POA: Diagnosis not present

## 2019-08-13 DIAGNOSIS — H3581 Retinal edema: Secondary | ICD-10-CM | POA: Diagnosis not present

## 2019-08-14 DIAGNOSIS — I2729 Other secondary pulmonary hypertension: Secondary | ICD-10-CM | POA: Diagnosis not present

## 2019-08-14 DIAGNOSIS — I2602 Saddle embolus of pulmonary artery with acute cor pulmonale: Secondary | ICD-10-CM | POA: Diagnosis not present

## 2019-08-14 DIAGNOSIS — G4733 Obstructive sleep apnea (adult) (pediatric): Secondary | ICD-10-CM | POA: Diagnosis not present

## 2019-08-19 IMAGING — CR DG CHEST 2V
2 series · 2 of 2 positions shown · non-contrast
Comparison: CT 08/20/2018, chest x-ray 08/18/2018

CLINICAL DATA: 72-year-old female with a history of asthma and
cough

EXAM:
CHEST - 2 VIEW

[w chest pa]
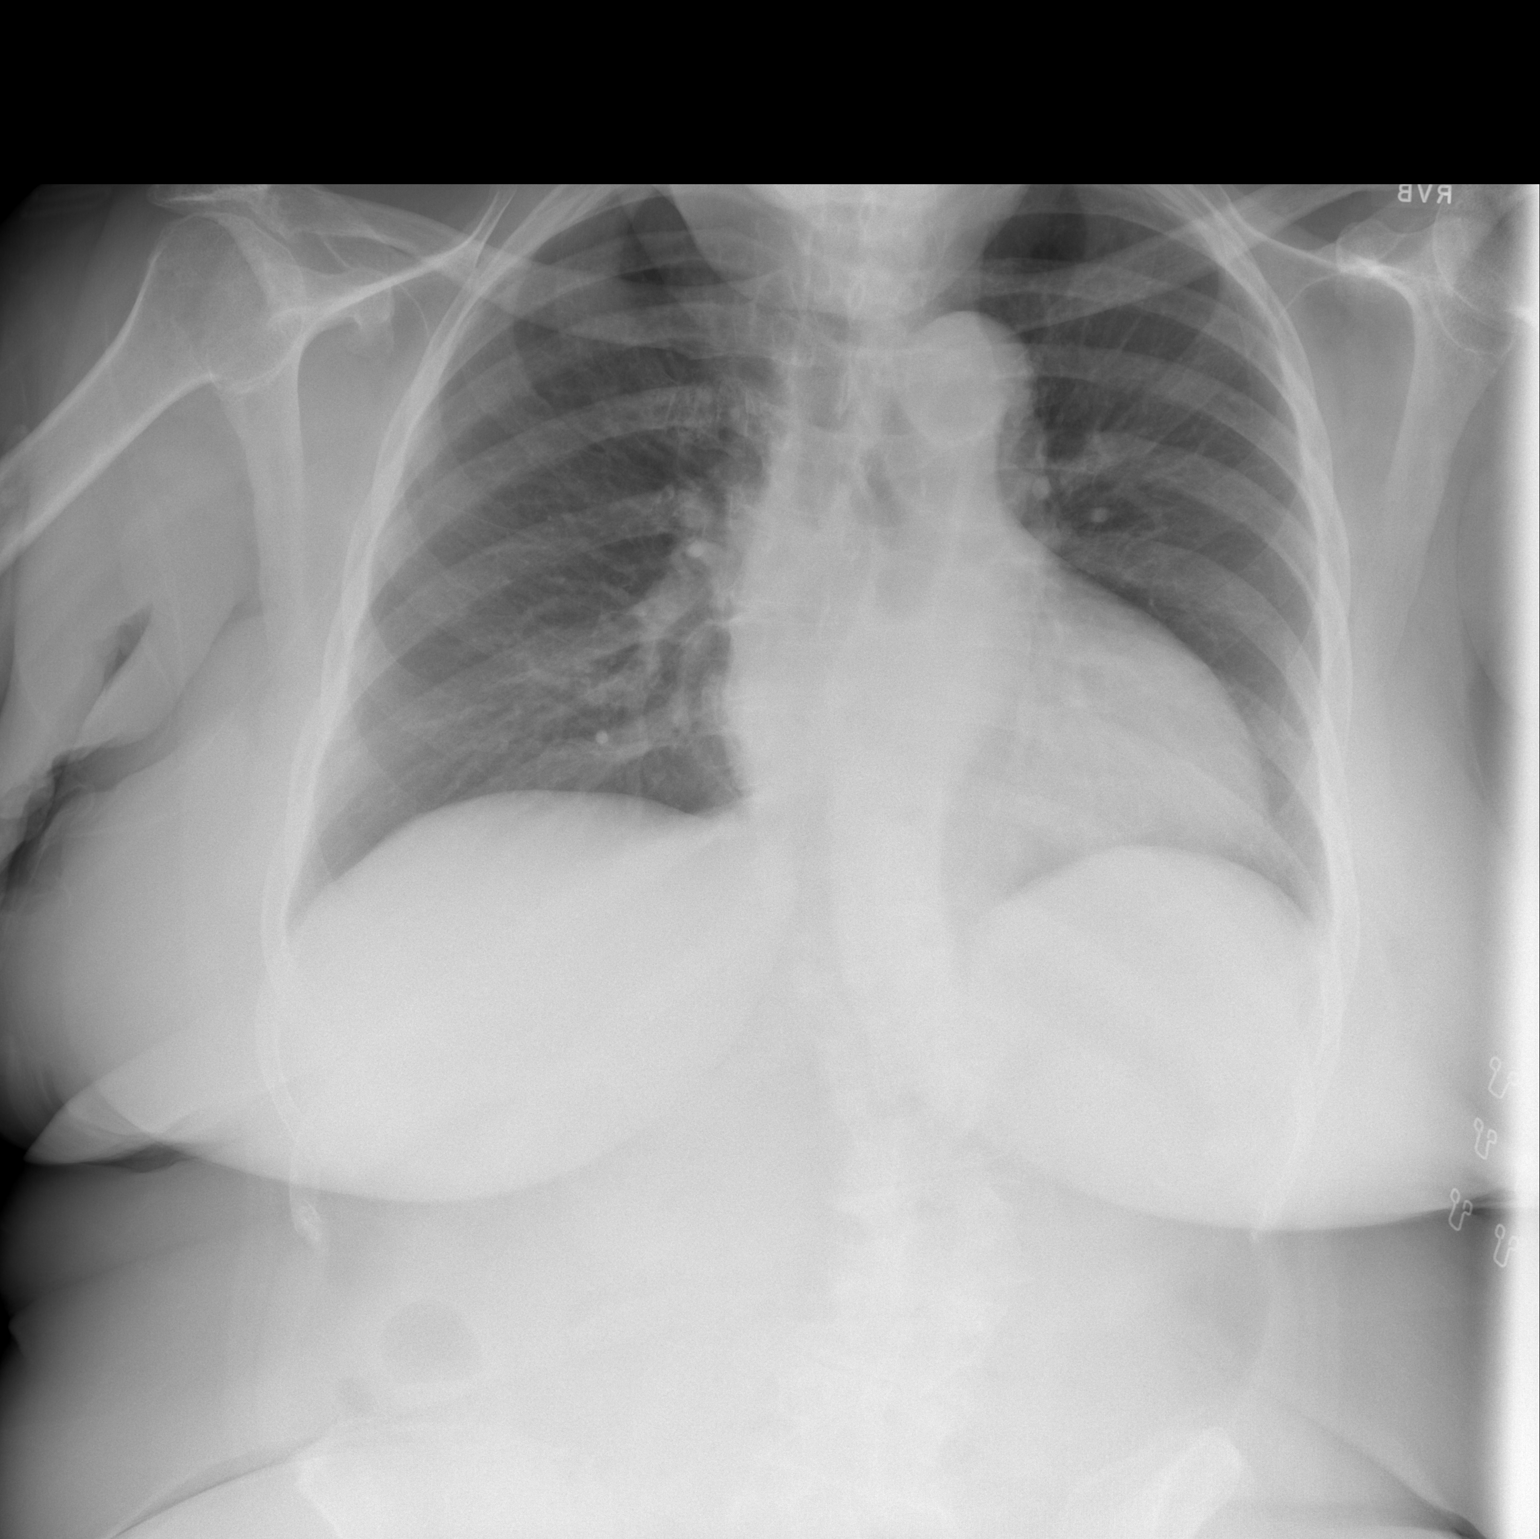

[w chest lat]
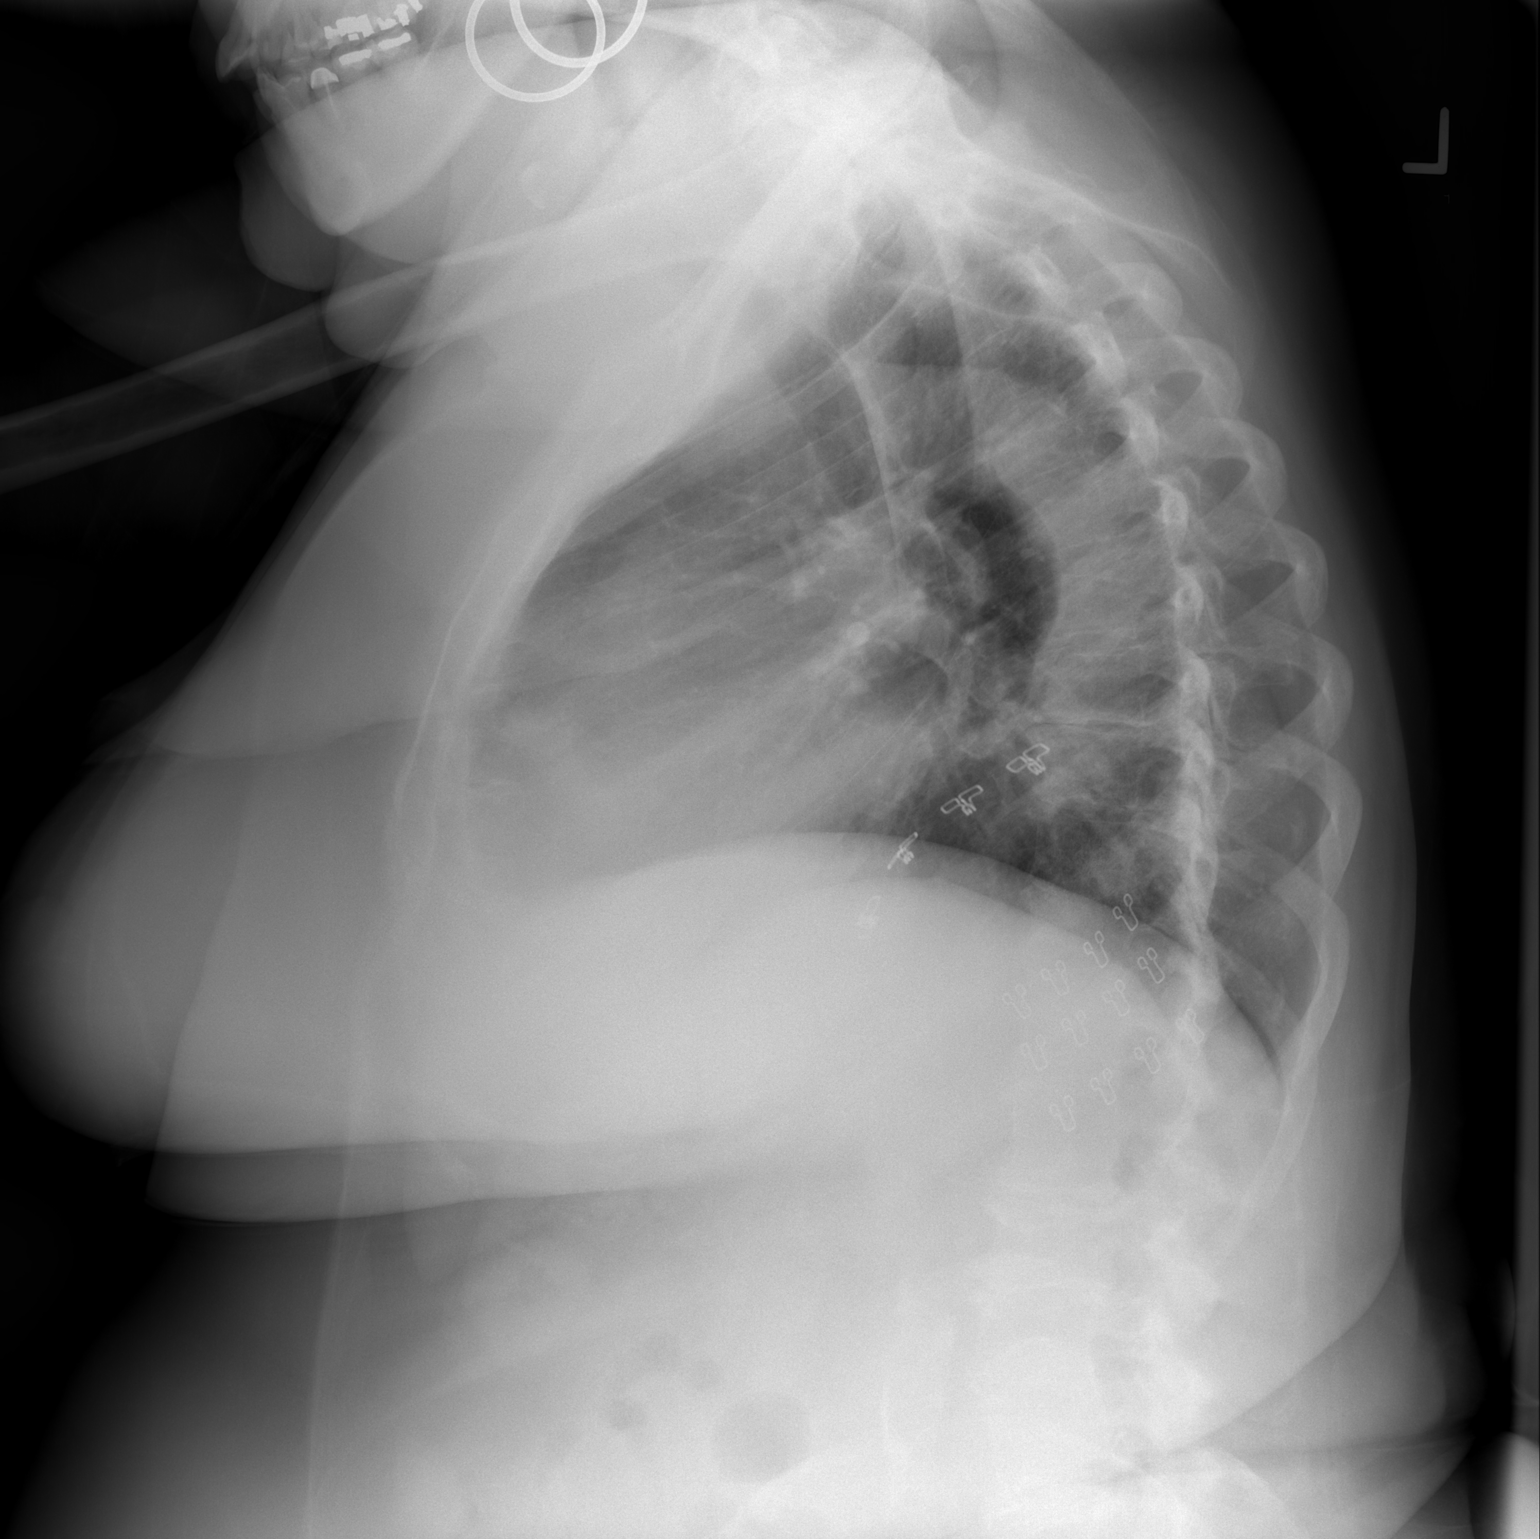

[2 of 2 positions shown; findings below may reference images not displayed]

FINDINGS: Cardiomediastinal silhouette unchanged in size and contour.
Tortuosity of the aorta. Scoliotic curvature of the thoracolumbar
spine again noted.

No confluent airspace disease, pleural effusion, or pneumothorax. No
evidence of pulmonary edema.
IMPRESSION: Negative for acute cardiopulmonary disease

## 2019-08-31 DIAGNOSIS — Z23 Encounter for immunization: Secondary | ICD-10-CM | POA: Diagnosis not present

## 2019-09-08 ENCOUNTER — Ambulatory Visit
Admission: RE | Admit: 2019-09-08 | Discharge: 2019-09-08 | Disposition: A | Discharge status: Home / Self Care | Payer: Medicare HMO | Source: Ambulatory Visit | Attending: Family Medicine | Admitting: Family Medicine

## 2019-09-08 ENCOUNTER — Other Ambulatory Visit: Payer: Self-pay

## 2019-09-08 DIAGNOSIS — Z1231 Encounter for screening mammogram for malignant neoplasm of breast: Secondary | ICD-10-CM

## 2019-09-13 DIAGNOSIS — I2602 Saddle embolus of pulmonary artery with acute cor pulmonale: Secondary | ICD-10-CM | POA: Diagnosis not present

## 2019-09-13 DIAGNOSIS — I2729 Other secondary pulmonary hypertension: Secondary | ICD-10-CM | POA: Diagnosis not present

## 2019-09-13 DIAGNOSIS — G4733 Obstructive sleep apnea (adult) (pediatric): Secondary | ICD-10-CM | POA: Diagnosis not present

## 2019-09-21 DIAGNOSIS — H30033 Focal chorioretinal inflammation, peripheral, bilateral: Secondary | ICD-10-CM | POA: Diagnosis not present

## 2019-09-21 DIAGNOSIS — Z79899 Other long term (current) drug therapy: Secondary | ICD-10-CM | POA: Diagnosis not present

## 2019-09-21 DIAGNOSIS — Z961 Presence of intraocular lens: Secondary | ICD-10-CM | POA: Diagnosis not present

## 2019-09-21 DIAGNOSIS — H44113 Panuveitis, bilateral: Secondary | ICD-10-CM | POA: Diagnosis not present

## 2019-09-21 DIAGNOSIS — H1132 Conjunctival hemorrhage, left eye: Secondary | ICD-10-CM | POA: Diagnosis not present

## 2019-09-21 DIAGNOSIS — H3581 Retinal edema: Secondary | ICD-10-CM | POA: Diagnosis not present

## 2019-10-01 ENCOUNTER — Telehealth: Payer: Self-pay | Admitting: Pulmonary Disease

## 2019-10-01 NOTE — Telephone Encounter (Signed)
Call returned to patient, confirmed DOB, she states she was prescribed eliquis after her hospital visit. She states she was told to take it for a year by Dr. Ellyn Hack. She states Dr. Ellyn Hack prescribed it but it was for her lungs.   BM please advise if patient should continue eliquis or defer to MR. Thanks.

## 2019-10-01 NOTE — Telephone Encounter (Signed)
Called and spoke pt letting her know the info stated by Aaron Edelman and that she needed to continue taking eliquis. Also stated to her that we needed to get her in for a consult to see AO and pt verbalized understanding. appt scheduled for pt with AO Wed. 11/11 at 11am. Nothing further needed.

## 2019-10-01 NOTE — Telephone Encounter (Signed)
10/01/2019 1545  Yes patient should be maintained on Eliquis.  I discussed this with the patient at her last office visit.  Patient is also due for follow-up with our office.  She is post to complete a pulmonary function test as well as a appointment to establish care with Dr. Ander Slade for management of her sleep.  See previous assessment and plan:  Obstructive sleep apnea Plan: Continue CPAP therapy Follow-up in 2 months with an in office visit with Dr. Ander Slade in a pulmonary function test Likely should establish with Dr. Ander Slade at that time  Morbid obesity [facility]) Plan: Continue to work on reducing weight and BMI  Pulmonary embolism ([place]) Plan: Continue Eliquis   Please schedule the patient with Dr. Ander Slade for a 30-minute consult slot.  Patient should be maintained on Eliquis.  Tricia Quaker, FNP

## 2019-10-06 ENCOUNTER — Ambulatory Visit: Payer: Medicare HMO | Admitting: Pulmonary Disease

## 2019-10-06 ENCOUNTER — Other Ambulatory Visit: Payer: Self-pay

## 2019-10-06 ENCOUNTER — Encounter: Payer: Self-pay | Admitting: Pulmonary Disease

## 2019-10-06 VITALS — BP 128/64 | HR 78 | Ht 61.0 in | Wt 195.0 lb

## 2019-10-06 DIAGNOSIS — G4733 Obstructive sleep apnea (adult) (pediatric): Secondary | ICD-10-CM | POA: Diagnosis not present

## 2019-10-06 DIAGNOSIS — Z9989 Dependence on other enabling machines and devices: Secondary | ICD-10-CM | POA: Diagnosis not present

## 2019-10-06 NOTE — Progress Notes (Signed)
Subjective:    Patient ID: Tricia Harrison, female    DOB: 1946-03-24, 73 y.o.   MRN: [id number]  Patient being seen for obstructive sleep apnea  Diagnosed with obstructive sleep apnea few years back She does use CPAP on a fairly regular basis not She does not like using it but tolerates it  She has no specific concerns about CPAP  She has been falling asleep more frequently without CPAP in place  No headaches, no dryness of her mouth  She does have a history of asthma Significant limitation with shortness of breath  She has a history of scoliosis, spinal stenosis Requires a walker to move around She does get short of breath with mild to moderate exertion Back pain and discomfort makes her walking unsteady  Recently started on Humira  Reformed smoker quit smoking in 1995 Of asthma significantly when she was much younger History of pulmonary embolism, diastolic heart failure, hypothyroidism  Past Medical History:  Diagnosis Date  . Anemia    as a child  . Asthma    as a child  . Chronic back pain    spinal stenosis and buldging disc;scoliosis  . Chronic constipation    occasionally takes something OTC  . Diabetes mellitus without complication ([place])    per pt "Pre" for 20+yrs  . Diverticulosis   . Gallstones    has known about this for 3-14yrs  . GERD (gastroesophageal reflux disease)    takes Omeprazole daily  . H/O hiatal hernia   . Hemorrhoids   . History of blood transfusion    no abnormal reaction noted  . History of bronchitis    states its been a long time ago  . History of gout   . HLD (hyperlipidemia)    takes Fenofibrate daily  . HTN (hypertension)    takes Diltiazem and Ramipril daily  . Hypothyroidism    takes Synthroid daily  . Left knee DJD   . Nocturia   . Palpitations    started in 2013 and only occasionally;last time noticed about 2-3wks ago and after drinking caffeine  . PONV (postoperative nausea and vomiting)   . Pulmonary  embolism with acute cor pulmonale (HCC) 07/2018   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   Social History   Socioeconomic History  . Marital status: Single    Spouse name: Not on file  . Number of children: Not on file  . Years of education: Not on file  . Highest education level: Not on file  Occupational History  . Occupation: Retired  Scientific laboratory technician  . Financial resource strain: Not on file  . Food insecurity    Worry: Not on file    Inability: Not on file  . Transportation needs    Medical: Not on file    Non-medical: Not on file  Tobacco Use  . Smoking status: Former Smoker    Packs/day: 0.50    Years: 20.00    Pack years: 10.00    Types: Cigarettes    Start date: 09/04/1964    Quit date: 11/25/1993    Years since quitting: 25.8  . Smokeless tobacco: Never Used  . Tobacco comment: quit smoking in 1995  Substance and Sexual Activity  . Alcohol use: Yes    Comment: Socially.  . Drug use: No  . Sexual activity: Not on file  Lifestyle  . Physical activity    Days  per week: Not on file    Minutes per session: Not on file  . Stress: Not on file  Relationships  . Social Herbalist on phone: Not on file    Gets together: Not on file    Attends religious service: Not on file    Active member of club or organization: Not on file    Attends meetings of clubs or organizations: Not on file    Relationship status: Not on file  . Intimate partner violence    Fear of current or ex partner: Not on file    Emotionally abused: Not on file    Physically abused: Not on file    Forced sexual activity: Not on file  Other Topics Concern  . Not on file  Social History Narrative   Pt lives w/ sister and nephew.   Family History  Problem Relation Age of Onset  . Uterine cancer Mother   . Heart disease Mother   . Hypertension Mother   . Diabetes Mother   . Cirrhosis Father   . Pneumonia Father   .  Lymphoma Sister        ,non hodgkin   . Diabetes Sister   . Breast cancer Neg Hx      Review of Systems  Constitutional: Negative for fever and unexpected weight change.  HENT: Negative for congestion, dental problem, ear pain, nosebleeds, postnasal drip, rhinorrhea, sinus pressure, sneezing, sore throat and trouble swallowing.   Eyes: Negative for redness and itching.  Respiratory: Positive for shortness of breath. Negative for cough, chest tightness and wheezing.   Cardiovascular: Negative for palpitations and leg swelling.  Gastrointestinal: Negative for nausea and vomiting.  Genitourinary: Negative for dysuria.  Musculoskeletal: Negative for joint swelling.  Skin: Negative for rash.  Allergic/Immunologic: Negative.  Negative for environmental allergies, food allergies and immunocompromised state.  Neurological: Negative for headaches.  Hematological: Does not bruise/bleed easily.  Psychiatric/Behavioral: Negative for dysphoric mood. The patient is not nervous/anxious.        Objective:   Physical Exam Constitutional:      Appearance: She is obese.  HENT:     Head: Normocephalic and atraumatic.     Nose: Nose normal. No congestion.     Mouth/Throat:     Mouth: Mucous membranes are moist.     Pharynx: No oropharyngeal exudate.  Eyes:     Pupils: Pupils are equal, round, and reactive to light.  Neck:     Musculoskeletal: Normal range of motion and neck supple. No neck rigidity or muscular tenderness.  Cardiovascular:     Rate and Rhythm: Normal rate.     Pulses: Normal pulses.     Heart sounds: No murmur. No friction rub.  Pulmonary:     Effort: Pulmonary effort is normal. No respiratory distress.     Breath sounds: Normal breath sounds. No stridor. No wheezing or rhonchi.  Musculoskeletal: Normal range of motion.        General: No swelling or tenderness.  Skin:    General: Skin is warm.  Neurological:     General: No focal deficit present.     Mental Status: She  is alert and oriented to person, place, and time.  Psychiatric:        Mood and Affect: Mood normal.   PAP compliance data reveals 53% compliance AHI of 7.3 Pressure settings 5-15    Assessment & Plan:  .  Shortness of breath -Likely multifactorial -Obesity -Musculoskeletal limitations  .  Obstructive sleep apnea -Suboptimal compliance with CPAP -Encouraged to use CPAP on a regular basis  .  History of pulmonary embolism -Continue anticoagulation  Plan: Encouraged to use CPAP on a regular basis  Stay active  Stay on Eliquis  Bronchodilators as needed  I will see her back in the office in about 3 months  Encouraged to call with any significant concerns

## 2019-10-06 NOTE — Patient Instructions (Signed)
Multiple factors contributing to your shortness of breath as discussed today  Try and stay as active as you can  Use your CPAP nightly as tolerated  Continue use of albuterol as needed  Stay on your blood thinners I do not believe you have a new blood clot  Call with significant concerns  We will see you back in the office and 3 months

## 2019-10-14 DIAGNOSIS — I2602 Saddle embolus of pulmonary artery with acute cor pulmonale: Secondary | ICD-10-CM | POA: Diagnosis not present

## 2019-10-14 DIAGNOSIS — I2729 Other secondary pulmonary hypertension: Secondary | ICD-10-CM | POA: Diagnosis not present

## 2019-10-14 DIAGNOSIS — G4733 Obstructive sleep apnea (adult) (pediatric): Secondary | ICD-10-CM | POA: Diagnosis not present

## 2019-10-29 DIAGNOSIS — G4733 Obstructive sleep apnea (adult) (pediatric): Secondary | ICD-10-CM | POA: Diagnosis not present

## 2019-11-09 DIAGNOSIS — E039 Hypothyroidism, unspecified: Secondary | ICD-10-CM | POA: Diagnosis not present

## 2019-11-09 DIAGNOSIS — E7800 Pure hypercholesterolemia, unspecified: Secondary | ICD-10-CM | POA: Diagnosis not present

## 2019-11-09 DIAGNOSIS — E041 Nontoxic single thyroid nodule: Secondary | ICD-10-CM | POA: Diagnosis not present

## 2019-11-09 DIAGNOSIS — N1832 Chronic kidney disease, stage 3b: Secondary | ICD-10-CM | POA: Diagnosis not present

## 2019-11-09 DIAGNOSIS — R42 Dizziness and giddiness: Secondary | ICD-10-CM | POA: Diagnosis not present

## 2019-11-09 DIAGNOSIS — E113219 Type 2 diabetes mellitus with mild nonproliferative diabetic retinopathy with macular edema, unspecified eye: Secondary | ICD-10-CM | POA: Diagnosis not present

## 2019-11-09 DIAGNOSIS — I10 Essential (primary) hypertension: Secondary | ICD-10-CM | POA: Diagnosis not present

## 2019-11-09 DIAGNOSIS — Z0001 Encounter for general adult medical examination with abnormal findings: Secondary | ICD-10-CM | POA: Diagnosis not present

## 2019-11-09 DIAGNOSIS — Z79899 Other long term (current) drug therapy: Secondary | ICD-10-CM | POA: Diagnosis not present

## 2019-11-09 DIAGNOSIS — I700 Atherosclerosis of aorta: Secondary | ICD-10-CM | POA: Diagnosis not present

## 2019-11-25 DIAGNOSIS — E7800 Pure hypercholesterolemia, unspecified: Secondary | ICD-10-CM | POA: Diagnosis not present

## 2019-11-25 DIAGNOSIS — E039 Hypothyroidism, unspecified: Secondary | ICD-10-CM | POA: Diagnosis not present

## 2019-11-25 DIAGNOSIS — E113219 Type 2 diabetes mellitus with mild nonproliferative diabetic retinopathy with macular edema, unspecified eye: Secondary | ICD-10-CM | POA: Diagnosis not present

## 2019-11-25 DIAGNOSIS — Z79899 Other long term (current) drug therapy: Secondary | ICD-10-CM | POA: Diagnosis not present

## 2019-11-26 HISTORY — PX: TRANSTHORACIC ECHOCARDIOGRAM: SHX275

## 2019-11-29 ENCOUNTER — Other Ambulatory Visit: Payer: Self-pay

## 2019-11-29 ENCOUNTER — Ambulatory Visit (HOSPITAL_COMMUNITY): Payer: Medicare HMO | Attending: Internal Medicine

## 2019-11-29 DIAGNOSIS — R0600 Dyspnea, unspecified: Secondary | ICD-10-CM | POA: Diagnosis not present

## 2019-11-29 DIAGNOSIS — I2699 Other pulmonary embolism without acute cor pulmonale: Secondary | ICD-10-CM | POA: Insufficient documentation

## 2019-11-29 DIAGNOSIS — I351 Nonrheumatic aortic (valve) insufficiency: Secondary | ICD-10-CM

## 2019-11-29 DIAGNOSIS — I2729 Other secondary pulmonary hypertension: Secondary | ICD-10-CM | POA: Insufficient documentation

## 2019-11-29 DIAGNOSIS — I2782 Chronic pulmonary embolism: Secondary | ICD-10-CM

## 2019-11-29 DIAGNOSIS — R0609 Other forms of dyspnea: Secondary | ICD-10-CM

## 2019-11-29 DIAGNOSIS — I2602 Saddle embolus of pulmonary artery with acute cor pulmonale: Secondary | ICD-10-CM | POA: Diagnosis not present

## 2019-12-07 DIAGNOSIS — H3581 Retinal edema: Secondary | ICD-10-CM | POA: Diagnosis not present

## 2019-12-07 DIAGNOSIS — Z961 Presence of intraocular lens: Secondary | ICD-10-CM | POA: Diagnosis not present

## 2019-12-07 DIAGNOSIS — H44113 Panuveitis, bilateral: Secondary | ICD-10-CM | POA: Diagnosis not present

## 2019-12-07 DIAGNOSIS — Z79899 Other long term (current) drug therapy: Secondary | ICD-10-CM | POA: Diagnosis not present

## 2019-12-07 DIAGNOSIS — H30033 Focal chorioretinal inflammation, peripheral, bilateral: Secondary | ICD-10-CM | POA: Diagnosis not present

## 2019-12-09 ENCOUNTER — Ambulatory Visit: Payer: Medicare HMO | Admitting: Cardiology

## 2019-12-09 ENCOUNTER — Encounter: Payer: Self-pay | Admitting: Cardiology

## 2019-12-09 ENCOUNTER — Other Ambulatory Visit: Payer: Self-pay

## 2019-12-09 VITALS — BP 136/75 | HR 59 | Ht 61.0 in | Wt 189.0 lb

## 2019-12-09 DIAGNOSIS — I2609 Other pulmonary embolism with acute cor pulmonale: Secondary | ICD-10-CM

## 2019-12-09 DIAGNOSIS — R002 Palpitations: Secondary | ICD-10-CM | POA: Diagnosis not present

## 2019-12-09 DIAGNOSIS — E785 Hyperlipidemia, unspecified: Secondary | ICD-10-CM | POA: Diagnosis not present

## 2019-12-09 DIAGNOSIS — I351 Nonrheumatic aortic (valve) insufficiency: Secondary | ICD-10-CM | POA: Diagnosis not present

## 2019-12-09 DIAGNOSIS — I10 Essential (primary) hypertension: Secondary | ICD-10-CM

## 2019-12-09 MED ORDER — ROSUVASTATIN CALCIUM 20 MG PO TABS: ORAL_TABLET | ORAL | 3 refills | Status: DC

## 2019-12-09 NOTE — Assessment & Plan Note (Signed)
Remains on Eliquis.  I think based on the size of the, would probably consider lifelong therapy, that would defer to pulmonary medicine.  Echocardiogram shows now totally improved right ventricular size and function.  Seems to have recovered from the acute cor pulmonale.

## 2019-12-09 NOTE — Assessment & Plan Note (Signed)
Mild AI on follow-up echocardiogram.  There is mild aortic sclerosis, barely audible on exam.  At this point I do not think need to follow-up again unless murmurs worsen.

## 2019-12-09 NOTE — Assessment & Plan Note (Signed)
Well-controlled on current dose of beta-blocker. 

## 2019-12-09 NOTE — Assessment & Plan Note (Signed)
Impressive weight loss.  Congratulated on her efforts.

## 2019-12-09 NOTE — Progress Notes (Signed)
Primary Care Provider: Lujean Amel, MD Cardiologist: Glenetta Hew, MD Electrophysiologist:   Clinic Note: Chief Complaint  Patient presents with  . Follow-up    History of PE; no complaints; follow-up echo    HPI:    Tricia Harrison is a 74 y.o. female with a PMH notable for submassive PE with pulmonary hypertension  who presents today for annual follow-up.   Tricia Harrison was last seen on January 2020.  She was doing fairly well with no major issues just recovering from a respiratory tract infection.  She does have some mild dependent edema when sitting for long period time, but otherwise no significant swelling.  No PND orthopnea.  No bleeding issues.  Recent Hospitalizations: None   Reviewed  CV studies:    The following studies were reviewed today: (if available, images/films reviewed: From Epic Chart or Care Everywhere) . Echo 11/29/2019: Normal EF 60 to 65%.  Moderate LVH-GR one DD.  R WMA.  Mild aortic sclerosis with trivial AI.  Grossly normal pulmonary valve) with no significant dilatation.  Normal RV and RA size.  Normal RV function.  Normal PA pressures.   Interval History:   Tricia Harrison returns here today overall doing fairly well.  She is still quite limited by her back and arthritis pain.  She walks with a Rollator.  Really the only exercise she can do is trying to use a stationary bicycle.  She has adjusted her diet and has lost quite a bit of weight (about 21 to 22 pounds since November 2019).  This has been just with dietary modification.  Unfortunately though she has been eating a lot of bread with butter on it, and also has stopped taking her Crestor because of myalgias and arthralgias.  As result.  Cholesterol levels have increased.  Besides having some understandable exertional dyspnea with longer walks, she is not having any cardiac symptoms to speak of.  Still has some mild end of day dependent edema but no PND orthopnea.   CV  Review of Symptoms (Summary) Cardiovascular ROS: positive for - dyspnea on exertion and edema negative for - chest pain, irregular heartbeat, orthopnea, palpitations, paroxysmal nocturnal dyspnea, rapid heart rate, shortness of breath or Syncope/near syncope, TIA/nurses BX, claudication  The patient does not have symptoms concerning for COVID-19 infection (fever, chills, cough, or new shortness of breath).  The patient is practicing social distancing and masking.  She does note that wearing the mask makes breathing hard   REVIEWED OF SYSTEMS   A comprehensive ROS was performed. Review of Systems  Constitutional: Positive for malaise/fatigue (Just because it hurts a lot to do much) and weight loss (Intentional).  HENT: Negative for congestion and nosebleeds.   Respiratory: Negative for cough and shortness of breath.   Cardiovascular: Positive for leg swelling (Trivial).  Gastrointestinal: Positive for constipation (Chronic). Negative for abdominal pain, blood in stool, heartburn and melena.  Genitourinary: Negative for dysuria and hematuria.  Musculoskeletal: Positive for back pain, joint pain and neck pain. Negative for falls.  Neurological: Negative for dizziness, focal weakness and weakness.  Endo/Heme/Allergies: Negative for environmental allergies.  Psychiatric/Behavioral: Negative for memory loss. The patient is not nervous/anxious and does not have insomnia.   All other systems reviewed and are negative.   I have reviewed and (if needed) personally updated the patient's problem list, medications, allergies, past medical and surgical history, social and family history.   PAST MEDICAL HISTORY   Past Medical History:  Diagnosis Date  .  Anemia    as a child  . Asthma    as a child  . Chronic back pain    spinal stenosis and buldging disc;scoliosis  . Chronic constipation    occasionally takes something OTC  . Diabetes mellitus without complication ([place])    per pt "Pre" for  20+yrs  . Diverticulosis   . Gallstones    has known about this for 3-71yrs  . GERD (gastroesophageal reflux disease)    takes Omeprazole daily  . H/O hiatal hernia   . Hemorrhoids   . History of blood transfusion    no abnormal reaction noted  . History of bronchitis    states its been a long time ago  . History of gout   . HLD (hyperlipidemia)    takes Fenofibrate daily  . HTN (hypertension)    takes Diltiazem and Ramipril daily  . Hypothyroidism    takes Synthroid daily  . Left knee DJD   . Nocturia   . Palpitations    started in 2013 and only occasionally;last time noticed about 2-3wks ago and after drinking caffeine  . PONV (postoperative nausea and vomiting)   . 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     PAST SURGICAL HISTORY   Past Surgical History:  Procedure Laterality Date  . COLONOSCOPY    . CT ANGIOGRAM OF CHEST - PE PROTOCOL  07/2018   Bilateral nonocclusive pulmonary emboli evidence of right heart strain consistent with "submassive "/intermediate PE.  -->  Code PE called.  Marland Kitchen DILATION AND CURETTAGE OF UTERUS     multiple about 6-7 times  . ESOPHAGOGASTRODUODENOSCOPY    . NM VQ LUNG SCAN ([facility])  11/23/2018   Low probability for PE  . RIGHT/LEFT HEART CATH AND CORONARY ANGIOGRAPHY N/A 08/19/2018   Procedure: RIGHT/LEFT HEART CATH AND CORONARY ANGIOGRAPHY;  Surgeon: Jettie Booze, MD;  Location: [facility];  Service: Cardiovascular:: Nonobstructive CAD.  Mild pulmonary pretension.  Mean PA pressure 26 mmHg with PA P 48/20 mmHg.  Normal LVEDP.  . THYROID SURGERY Right 2010  . TOTAL HIP ARTHROPLASTY Left 2005  . TOTAL KNEE ARTHROPLASTY Left 02/21/2014   Procedure: TOTAL KNEE ARTHROPLASTY;  Surgeon: Lorn Junes, MD;  Location: [facility];  Service: Orthopedics;  Laterality: Left;  . TRANSTHORACIC ECHOCARDIOGRAM  11/2019    Normal EF 60 to 65%.  Moderate LVH-GR one DD.  R WMA.  Mild aortic sclerosis with trivial AI.  Grossly normal pulmonary valve) with no significant dilatation.  Normal RV and RA size.  Normal RV function.  Normal PA pressures.  . TRANSTHORACIC ECHOCARDIOGRAM  07/2018    (In setting of acute PE) mild LVH.  EF 55 to 60%.  GR 1 DD.  Aortic sclerosis but no stenosis.  Mild regurgitation.  Mild RA dilation.  Moderate LA dilation.  Moderate tricuspid regurgitation with severe primary hypertension estimated PA pressure 71 mmHg.  RV poorly visualized  . TRANSTHORACIC ECHOCARDIOGRAM  11/16/2018   EF 60-65%.  GR 1 DD.  Focal basal hypertrophy.  Moderate aortic regurgitation.  No stenosis.  Mild LA dilation.  Normal RV size and function.  Moderate PI but mild TR.  Peak PA pressure is now estimated 38 mmHg.  Marland Kitchen VAGINAL HYSTERECTOMY  1979     MEDICATIONS/ALLERGIES   Current Meds  Medication Sig  . acetaminophen (TYLENOL) 500  MG tablet Take 1,000 mg by mouth every 6 (six) hours as needed for mild pain.  Marland Kitchen albuterol (VENTOLIN HFA) 108 (90 Base) MCG/ACT inhaler Inhale 2 puffs into the lungs every 6 (six) hours as needed.  . carvedilol (COREG) 6.25 MG tablet TAKE 1 TABLET BY MOUTH TWO  TIMES DAILY WITH A MEAL (Patient taking differently: daily. )  . Cholecalciferol (VITAMIN D3) 2000 UNITS TABS Take 2,000 mg by mouth daily.  Marland Kitchen diltiazem (CARDIZEM LA) 360 MG 24 hr tablet Take 360 mg by mouth daily.  Marland Kitchen ELIQUIS 5 MG TABS tablet TAKE 1 TABLET BY MOUTH TWO  TIMES DAILY  . fenofibrate (TRICOR) 145 MG tablet Take 145 mg by mouth daily.  Marland Kitchen levothyroxine (SYNTHROID, LEVOTHROID) 50 MCG tablet Take 50 mcg by mouth daily before breakfast.  . omeprazole (PRILOSEC) 20 MG capsule Take 20 mg by mouth daily.  . polyethylene glycol (MIRALAX / GLYCOLAX) 17 g packet Take 17 g by mouth daily as needed for mild constipation.    Allergies  Allergen Reactions  . Atorvastatin Other (See Comments)    Muscle pain  . Codeine Nausea  And Vomiting  . Morphine And Related     B/p drops      SOCIAL HISTORY/FAMILY HISTORY   Social History   Tobacco Use  . Smoking status: Former Smoker    Packs/day: 0.50    Years: 20.00    Pack years: 10.00    Types: Cigarettes    Start date: 09/04/1964    Quit date: 11/25/1993    Years since quitting: 26.0  . Smokeless tobacco: Never Used  . Tobacco comment: quit smoking in 1995  Substance Use Topics  . Alcohol use: Yes    Comment: Socially.  . Drug use: No   Social History   Social History Narrative   Pt lives w/ sister and nephew.    Family History family history includes Cirrhosis in her father; Diabetes in her mother and sister; Heart disease in her mother; Hypertension in her mother; Lymphoma in her sister; Pneumonia in her father; Uterine cancer in her mother.   OBJCTIVE -PE, EKG, labs   Wt Readings from Last 3 Encounters:  12/09/19 189 lb (85.7 kg)  10/06/19 195 lb (88.5 kg)  07/05/19 199 lb 6.4 oz (90.4 kg)  Nov 2019 -- > 211 lb  Physical Exam: BP 136/75   Pulse (!) 59   Ht 5\' 1"  (1.549 m)   Wt 189 lb (85.7 kg)   SpO2 99%   BMI 35.71 kg/m  Physical Exam  Constitutional: She is oriented to person, place, and time. She appears well-developed. No distress.  Pleasant elderly woman with significant kyphoscoliosis and somewhat unsteady gait using Rollator.  Well-groomed  HENT:  Head: Normocephalic and atraumatic.  Neck: No hepatojugular reflux and no JVD present. Carotid bruit is not present.  Cardiovascular: Normal rate and regular rhythm.  No extrasystoles are present. PMI is not displaced (Unable to assess). Exam reveals distant heart sounds and decreased pulses (Mildly decreased with palpable pedal pulses.). Exam reveals no gallop and no friction rub.  Murmur heard.  Low-pitched harsh crescendo-decrescendo early systolic murmur is present with a grade of 1/6 at the upper right sternal border and upper left sternal border. Pulmonary/Chest: Effort normal  and breath sounds normal. No respiratory distress. She has no wheezes. She has no rales.  Abdominal: Soft. Bowel sounds are normal. She exhibits no distension. There is no abdominal tenderness. There is no rebound.  Musculoskeletal:  General: No edema. Normal range of motion.     Cervical back: Normal range of motion and neck supple.  Neurological: She is alert and oriented to person, place, and time.  Psychiatric: She has a normal mood and affect. Her behavior is normal. Judgment and thought content normal.  In great spirits  Vitals reviewed.    Adult ECG Report  Rate: 59 ;  Rhythm: sinus bradycardia and indeterminate; Left axis deviation (-42).  Cannot exclude septal MI, age undetermined.  Also nonspecific T wave inversions in lateral leads, consider ischemia.  Narrative Interpretation: Essentially stable finding.  Recent Labs:  11/17/2019: TC two oh two, TG one twenty, HDL fifty-five, LDL one twenty-five.  A1c 5.7, Hgb 12.2; Cr 1.38, K+ 4.3. Lab Results  Component Value Date   CHOL 190 10/08/2018   HDL 84 10/08/2018   LDLCALC 92 10/08/2018   TRIG 71 10/08/2018   CHOLHDL 2.3 10/08/2018   Lab Results  Component Value Date   CREATININE 1.28 (H) 10/08/2018   BUN 28 (H) 10/08/2018   NA 142 10/08/2018   K 4.0 10/08/2018   CL 104 10/08/2018   CO2 23 10/08/2018    ASSESSMENT/PLAN    Problem List Items Addressed This Visit    Essential hypertension (Chronic)    Blood pressure very well controlled at current dose of carvedilol and diltiazem..  Continue to monitor for potential bradycardia.      Relevant Medications   rosuvastatin (CRESTOR) 20 MG tablet   Hyperlipidemia LDL goal <100 (Chronic)    LDL previously been ninety-three, now up to one twenty-five..  Probably related to her stopping her statin. Exam would only to be as aggressive, but out would like to see LDL less than one hundred.  Plan: Restart her Crestor at 1/2 tablet 3 days a week, after 3 months increase  to full tablet on Monday, then after each month increase until taking full tablet 3 days a week if tolerated.      Relevant Medications   rosuvastatin (CRESTOR) 20 MG tablet   Morbid obesity (HCC) (Chronic)    Impressive weight loss.  Congratulated on her efforts.      Aortic regurgitation (Chronic)    Mild AI on follow-up echocardiogram.  There is mild aortic sclerosis, barely audible on exam.  At this point I do not think need to follow-up again unless murmurs worsen.      Relevant Medications   rosuvastatin (CRESTOR) 20 MG tablet   Palpitations (Chronic)    Well-controlled on current dose of beta-blocker.      Relevant Orders   EKG 12-Lead   Pulmonary embolism (HCC) - Primary (Chronic)    Remains on Eliquis.  I think based on the size of the, would probably consider lifelong therapy, that would defer to pulmonary medicine.  Echocardiogram shows now totally improved right ventricular size and function.  Seems to have recovered from the acute cor pulmonale.      Relevant Medications   rosuvastatin (CRESTOR) 20 MG tablet       COVID-19 Education: The signs and symptoms of COVID-19 were discussed with the patient and how to seek care for testing (follow up with PCP or arrange E-visit).   The importance of social distancing was discussed today.  I spent a total of 37minutes with the patient and chart review. >  50% of the time was spent in direct patient consultation.  Additional time spent with chart review (studies, outside notes, etc): 6 Total Time: 24 min  Current medicines are reviewed at length with the patient today.  (+/- concerns) she stopped taking Crestor because of myalgias.   Patient Instructions / Medication Changes & Studies & Tests Ordered   Patient Instructions  Medication Instructions:  Try taking  10 mg ( 1/2 tablet of 20 mg) on Monday -Wednesday-Friday for 3 months , every month increase up to 20 mg each day that is taken until you ae taking 20 mg  M-W-F No other changes   *If you need a refill on your cardiac medications before your next appointment, please call your pharmacy*  Lab Work: Not needed  Testing/Procedures: Not needed  Follow-Up: At [facility], you and your health needs are our priority.  As part of our continuing mission to provide you with exceptional heart care, we have created designated Provider Care Teams.  These Care Teams include your primary Cardiologist (physician) and Advanced Practice Providers (APPs -  Physician Assistants and Nurse Practitioners) who all work together to provide you with the care you need, when you need it.  Your next appointment:   12 month(s)- Jan 2022  The format for your next appointment:   In Person  Provider:   Glenetta Hew, MD    Studies Ordered:   Orders Placed This Encounter  Procedures  . EKG 12-Lead     Glenetta Hew, M.D., M.S. Interventional Cardiologist   Pager # [phone] Phone # [phone] [address] [place], [place] [address]   Thank you for choosing Heartcare at [facility]!!

## 2019-12-09 NOTE — Assessment & Plan Note (Signed)
Blood pressure very well controlled at current dose of carvedilol and diltiazem..  Continue to monitor for potential bradycardia.

## 2019-12-09 NOTE — Assessment & Plan Note (Signed)
LDL previously been ninety-three, now up to one twenty-five..  Probably related to her stopping her statin. Exam would only to be as aggressive, but out would like to see LDL less than one hundred.  Plan: Restart her Crestor at 1/2 tablet 3 days a week, after 3 months increase to full tablet on Monday, then after each month increase until taking full tablet 3 days a week if tolerated.

## 2019-12-09 NOTE — Patient Instructions (Signed)
Medication Instructions:  Try taking  10 mg ( 1/2 tablet of 20 mg) on Monday -Wednesday-Friday for 3 months , every month increase up to 20 mg each day that is taken until you ae taking 20 mg M-W-F No other changes   *If you need a refill on your cardiac medications before your next appointment, please call your pharmacy*  Lab Work: Not needed  Testing/Procedures: Not needed  Follow-Up: At [facility], you and your health needs are our priority.  As part of our continuing mission to provide you with exceptional heart care, we have created designated Provider Care Teams.  These Care Teams include your primary Cardiologist (physician) and Advanced Practice Providers (APPs -  Physician Assistants and Nurse Practitioners) who all work together to provide you with the care you need, when you need it.  Your next appointment:   12 month(s)- Jan 2022  The format for your next appointment:   In Person  Provider:   Glenetta Hew, MD

## 2020-02-18 ENCOUNTER — Other Ambulatory Visit: Payer: Self-pay | Admitting: Cardiology

## 2020-05-01 ENCOUNTER — Other Ambulatory Visit: Payer: Self-pay | Admitting: Cardiology

## 2020-09-22 ENCOUNTER — Other Ambulatory Visit: Payer: Self-pay | Admitting: Family Medicine

## 2020-09-22 DIAGNOSIS — Z1231 Encounter for screening mammogram for malignant neoplasm of breast: Secondary | ICD-10-CM

## 2020-10-31 ENCOUNTER — Ambulatory Visit
Admission: RE | Admit: 2020-10-31 | Discharge: 2020-10-31 | Disposition: A | Discharge status: Home / Self Care | Payer: Medicare HMO | Source: Ambulatory Visit | Attending: Family Medicine | Admitting: Family Medicine

## 2020-10-31 ENCOUNTER — Other Ambulatory Visit: Payer: Self-pay

## 2020-10-31 DIAGNOSIS — Z1231 Encounter for screening mammogram for malignant neoplasm of breast: Secondary | ICD-10-CM

## 2020-11-27 ENCOUNTER — Ambulatory Visit: Payer: Medicare HMO

## 2020-12-04 ENCOUNTER — Ambulatory Visit: Payer: Medicare HMO | Attending: Family Medicine | Admitting: Physical Therapy

## 2020-12-04 ENCOUNTER — Other Ambulatory Visit: Payer: Self-pay

## 2020-12-04 ENCOUNTER — Encounter: Payer: Self-pay | Admitting: Physical Therapy

## 2020-12-04 DIAGNOSIS — M545 Low back pain, unspecified: Secondary | ICD-10-CM | POA: Insufficient documentation

## 2020-12-04 DIAGNOSIS — G8929 Other chronic pain: Secondary | ICD-10-CM

## 2020-12-04 DIAGNOSIS — M6281 Muscle weakness (generalized): Secondary | ICD-10-CM | POA: Diagnosis present

## 2020-12-04 DIAGNOSIS — M542 Cervicalgia: Secondary | ICD-10-CM | POA: Diagnosis present

## 2020-12-04 DIAGNOSIS — R2689 Other abnormalities of gait and mobility: Secondary | ICD-10-CM | POA: Diagnosis present

## 2020-12-04 DIAGNOSIS — R293 Abnormal posture: Secondary | ICD-10-CM | POA: Insufficient documentation

## 2020-12-04 NOTE — Therapy (Signed)
[facility] [address], [place], [place], [address] Phone: [phone]   Fax:  [phone]  Physical Therapy Evaluation  Patient Details  Name: Tricia Harrison MRN: [id number] Date of Birth: [birth year] Referring Provider (PT): Lujean Amel, MD   Encounter Date: 12/04/2020   PT End of Session - 12/04/20 1136    Visit Number 1    Date for PT Re-Evaluation 02/26/21    Authorization Type [employer]    Progress Note Due on Visit 10    PT Start Time 1143    PT Stop Time 1235    PT Time Calculation (min) 52 min    Behavior During Therapy [facility] for tasks assessed/performed           Past Medical History:  Diagnosis Date  . Anemia    as a child  . Asthma    as a child  . Chronic back pain    spinal stenosis and buldging disc;scoliosis  . Chronic constipation    occasionally takes something OTC  . Diabetes mellitus without complication ([facility])    per pt "Pre" for 20+yrs  . Diverticulosis   . Gallstones    has known about this for 3-64yrs  . GERD (gastroesophageal reflux disease)    takes Omeprazole daily  . H/O hiatal hernia   . Hemorrhoids   . History of blood transfusion    no abnormal reaction noted  . History of bronchitis    states its been a long time ago  . History of gout   . HLD (hyperlipidemia)    takes Fenofibrate daily  . HTN (hypertension)    takes Diltiazem and Ramipril daily  . Hypothyroidism    takes Synthroid daily  . Left knee DJD   . Nocturia   . Palpitations    started in 2013 and only occasionally;last time noticed about 2-3wks ago and after drinking caffeine  . PONV (postoperative nausea and vomiting)   . 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    Past Surgical History:  Procedure Laterality Date  . COLONOSCOPY    . CT ANGIOGRAM OF CHEST - PE  PROTOCOL  07/2018   Bilateral nonocclusive pulmonary emboli evidence of right heart strain consistent with "submassive "/intermediate PE.  -->  Code PE called.  Marland Kitchen DILATION AND CURETTAGE OF UTERUS     multiple about 6-7 times  . ESOPHAGOGASTRODUODENOSCOPY    . NM VQ LUNG SCAN ([place] [facility])  11/23/2018   Low probability for PE  . RIGHT/LEFT HEART CATH AND CORONARY ANGIOGRAPHY N/A 08/19/2018   Procedure: RIGHT/LEFT HEART CATH AND CORONARY ANGIOGRAPHY;  Surgeon: Jettie Booze, MD;  Location: [facility];  Service: Cardiovascular:: Nonobstructive CAD.  Mild pulmonary pretension.  Mean PA pressure 26 mmHg with PA P 48/20 mmHg.  Normal LVEDP.  . THYROID SURGERY Right 2010  . TOTAL HIP ARTHROPLASTY Left 2005  . TOTAL KNEE ARTHROPLASTY Left 02/21/2014   Procedure: TOTAL KNEE ARTHROPLASTY;  Surgeon: Lorn Junes, MD;  Location: [facility];  Service: Orthopedics;  Laterality: Left;  . TRANSTHORACIC ECHOCARDIOGRAM  11/2019   Normal EF 60 to 65%.  Moderate LVH-GR one DD.  R WMA.  Mild aortic sclerosis with trivial AI.  Grossly normal pulmonary valve) with no significant dilatation.  Normal RV and RA size.  Normal RV function.  Normal  PA pressures.  . TRANSTHORACIC ECHOCARDIOGRAM  07/2018    (In setting of acute PE) mild LVH.  EF 55 to 60%.  GR 1 DD.  Aortic sclerosis but no stenosis.  Mild regurgitation.  Mild RA dilation.  Moderate LA dilation.  Moderate tricuspid regurgitation with severe primary hypertension estimated PA pressure 71 mmHg.  RV poorly visualized  . TRANSTHORACIC ECHOCARDIOGRAM  11/16/2018   EF 60-65%.  GR 1 DD.  Focal basal hypertrophy.  Moderate aortic regurgitation.  No stenosis.  Mild LA dilation.  Normal RV size and function.  Moderate PI but mild TR.  Peak PA pressure is now estimated 38 mmHg.  Marland Kitchen VAGINAL HYSTERECTOMY  1979    There were no vitals filed for this visit.    Subjective Assessment - 12/04/20 1139    Subjective Pt is referred to OPPT with ongoing impact from  scoliosis and LE weakness.  She has a history of pain since 2005 when she had her Lt hip replaced.  Back and bil knees worsened and she had Lt knee replaced.  "It has never straightened out since it was replaced."  Did aquatic and land-based PT in 2019/2020 for low back.  My legs are now weak b/c I didn't move enough when my back was hurting.  I have trouble with sit to stand from any surface.  I am now full time using a walker as of 2020.  I want to get rid of it.  I am also very deconditioned and I get quickly out of breath.  I had double lung pulmonary embolus in 2019.  I am still on blood thinners.    Pertinent History Lt THR 2005, Lt TKR 2015, spinal stenosis, scoliosis, DM, HLD, HTN, Hx of PE    Limitations Standing;Walking    How long can you stand comfortably? <5 min    How long can you walk comfortably? household distances, very fatiguing    Patient Stated Goals stand up and walk by myself, no water therapy while it's cold    Currently in Pain? Yes    Pain Score 7     Pain Location Back    Pain Orientation Lower    Pain Descriptors / Indicators Dull    Pain Type Chronic pain    Pain Radiating Towards no    Pain Onset More than a month ago    Pain Frequency Intermittent    Aggravating Factors  walking, standing, not standing up straight    Pain Relieving Factors sit    Multiple Pain Sites Yes    Pain Score 7    Pain Location Neck    Pain Orientation Posterior    Pain Descriptors / Indicators Tightness;Aching    Pain Type Chronic pain    Pain Onset More than a month ago    Pain Frequency Intermittent    Aggravating Factors  sitting slouched    Pain Relieving Factors massage machine, moving neck/stretching              [facility] PT Assessment - 12/04/20 0001      Assessment   Medical Diagnosis M41.9 (ICD-10-CM) - Scoliosis of cervical spine, unspecified scoliosis type, R29.898 (ICD-10-CM) - Leg weakness, bilateral    Referring Provider (PT) Koirala, Dibas, MD    Onset  Date/Surgical Date --   10-15 years ago for back   Hand Dominance Right    Next MD Visit as needed    Prior Therapy yes for back, knee, did aquatic therapy in past  Precautions   Precautions Fall      Restrictions   Weight Bearing Restrictions No      Balance Screen   Has the patient fallen in the past 6 months Yes    How many times? 1    Has the patient had a decrease in activity level because of a fear of falling?  Yes    Is the patient reluctant to leave their home because of a fear of falling?  Yes      [place] residence    Living Arrangements Other relatives    Type of [place] to enter    Entrance Stairs-Number of Steps 1    [place] One level    [place] - 4 wheels      Prior Function   Level of Independence Independent with household mobility with device;Independent with community mobility with device    Vocation Retired    Leisure seated activities like puzzles      Cognition   Overall Cognitive Status Within Functional Limits for tasks assessed      Observation/Other Assessments-Edema    Edema --   chronic Lt knee swelling since TKR in 2015     ROM / Strength   AROM / PROM / Strength AROM;Strength      AROM   AROM Assessment Site Cervical;Knee    Right/Left Knee Left    Left Knee Extension -20    Cervical Flexion 25    Cervical Extension 25    Cervical - Right Side Bend 20    Cervical - Left Side Bend 20    Cervical - Right Rotation 50    Cervical - Left Rotation 50      Strength   Overall Strength Comments Lt hip flexion 3+/5, Lt knee ext 3+/5, bil LEs 4-/5      Flexibility   Soft Tissue Assessment /Muscle Length yes    Hamstrings limited bil 30%      Transfers   Transfers Sit to Stand    Sit to Stand 6: Modified independent (Device/Increase time);5: Supervision    Five time sit to stand comments  34 sec, uses UEs on walker to assist,  poor eccentric control      Ambulation/Gait   Ambulation/Gait Yes    Ambulation/Gait Assistance 6: Modified independent (Device/Increase time);5: Supervision    Ambulation Distance (Feet) 45 Feet    Assistive device Rolling walker    Gait Pattern Step-through pattern;Left flexed knee in stance;Decreased weight shift to left;Decreased step length - right;Decreased step length - left;Trunk flexed;Narrow base of support    Gait Comments cardiovascular limitations      Standardized Balance Assessment   Standardized Balance Assessment Timed Up and Go Test      Timed Up and Go Test   TUG Normal TUG    Normal TUG (seconds) 34    TUG Comments used rolling walker and UEs to assist with sit to stand                      Objective measurements completed on examination: See above findings.               PT Education - 12/04/20 1241    Education Details Access Code: [email or web address]    Person(s) Educated Patient    Methods Explanation;Demonstration;Handout    Comprehension Verbalized understanding;Returned demonstration  PT Short Term Goals - 12/04/20 1300      PT SHORT TERM GOAL #1   Title Pt will be ind with initial HEP    Time 3    Period Weeks    Status New    Target Date 12/25/20      PT SHORT TERM GOAL #2   Title Pt will be able to participate in 3 min walk test with short breaks as needed    Time 4    Period Weeks    Status New    Target Date 01/01/21      PT SHORT TERM GOAL #3   Title Pt will demo 5x sit to stand using UEs and AD as needed in </= 28 sec    Time 4    Period Weeks    Status New    Target Date 01/01/21      PT SHORT TERM GOAL #4   Title Pt will reduce TUG time to </= 28 sec    Time 4    Period Weeks    Status New    Target Date 01/01/21             PT Long Term Goals - 12/04/20 1303      PT LONG TERM GOAL #1   Title Pt will improve LE strength to at least 4/5 bil for improved ease with transfers in/out of car,  bed and chair    Time 12    Period Weeks    Status New    Target Date 02/26/21      PT LONG TERM GOAL #2   Title Pt will be able to particpate in 6 min walk test with AD and short breaks as needed to demo improved gait endurance.    Time 12    Period Weeks    Status New    Target Date 02/26/21      PT LONG TERM GOAL #3   Title Pt will demo reduced fall risk by reducing both TUG time and 5x sit to stand with no AD or least restrictive AD to </= 24 sec.    Baseline 34 sec for TUG, 34 sec for 5x sit to stand    Time 12    Period Weeks    Status New    Target Date 02/26/21      PT LONG TERM GOAL #4   Title Pt will tolerate light household standing tasks up to 15 min for improved meal prep and light cleaning.    Time 12    Period Weeks    Status New    Target Date 02/26/21      PT LONG TERM GOAL #5   Title Pt will report improved confidence in balance and stability with short distance community ambulation by at least 50%.    Time 12    Period Weeks    Status New    Target Date 02/26/21                  Plan - 12/04/20 1248    Clinical Impression Statement Pt is a pleasant 75yo female with complex medical history referred to OPPT with scoliosis and bil leg weakness.  Pt has history of Lt THR in 2005 and Lt TKR in 2015.  She has chronic LBP x 10-15 years with spinal stenosis.  More recently she has developed pain in the base of the neck, rated 7/10, which improves with neck ROM and stretching.  She admits  very low activity level which has contributed toward her leg weakness and deconditioned state.  She has cardiovascular limitations with short distance ambulation which may be partially related to bil PE in 2019.  She started using a rolling walker full time for community ambulation in 2020 and wishes to d/c it with PT.  Her Lt knee remains flexed and has been this way since TKR.  She has weakness in bil LEs Lt>Rt ranging from 3+/5 to 4-/5, most notably weak in Lt hip flexion  and Lt knee ext.  5x sit to stand and TUG are both 34 sec with use of walker and need for walker to assist with sit to stand.  Pt ambulates slowly with flexed trunk, forward flexed head and neck, flexed Lt knee, limited weight shift Lt>Rt and narrow base of support.  PT initiated HEP for LE strength, sit to stand and weight shifting today with good tolerance.  PT encouraged Pt to limit stationary sitting to no more than 1 hour without cued movement and to increase short household walking bouts throughout the day to improve overall functional mobility and cardiovascular health.  Pt will benefit from skilled PT to address deficits and improve overall mobility and function.    Personal Factors and Comorbidities Age;Comorbidity 1;Comorbidity 2;Comorbidity 3+    Comorbidities spinal stenosis, Hx of THR/TKR on Lt, Hx of bil PE, cardiovascular deconditioning    Examination-Activity Limitations Locomotion Level;Transfers;Bed Mobility;Bend;Squat;Dressing;Stairs;Stand    Examination-Participation Restrictions Meal Prep;Community Activity;Driving;Laundry;Shop;Cleaning    Stability/Clinical Decision Making Evolving/Moderate complexity    Clinical Decision Making Moderate    Rehab Potential Good    PT Frequency 2x / week    PT Duration 12 weeks    PT Treatment/Interventions ADLs/Self Care Home Management;Aquatic Therapy;Electrical Stimulation;Moist Heat;DME Instruction;Functional mobility training;Gait training;Therapeutic activities;Therapeutic exercise;Balance training;Neuromuscular re-education;Manual techniques;Patient/family education;Passive range of motion;Joint Manipulations;Spinal Manipulations;Taping    PT Next Visit Plan review HEP, work on gait endurance, counter weight shifts, counter balance activities within tolerance, seated UE row and ext, NuStep    PT Home Exercise Plan Access Code: [email or web address]    Consulted and Agree with Plan of Care Patient           Patient will benefit from skilled  therapeutic intervention in order to improve the following deficits and impairments:  Abnormal gait,Decreased range of motion,Difficulty walking,Cardiopulmonary status limiting activity,Decreased endurance,Pain,Decreased activity tolerance,Decreased balance,Impaired flexibility,Hypomobility,Improper body mechanics,Postural dysfunction,Decreased strength,Decreased mobility  Visit Diagnosis: Muscle weakness (generalized) - Plan: PT plan of care cert/re-cert  Chronic midline low back pain without sciatica - Plan: PT plan of care cert/re-cert  Cervicalgia - Plan: PT plan of care cert/re-cert  Other abnormalities of gait and mobility - Plan: PT plan of care cert/re-cert  Abnormal posture - Plan: PT plan of care cert/re-cert     Problem List Patient Active Problem List   Diagnosis Date Noted  . Obstructive sleep apnea 03/09/2019  . Aortic regurgitation 12/02/2018  . Pulmonary embolism ([facility]) 08/20/2018  . Elevated troponin 08/20/2018  . DOE (dyspnea on exertion)   . 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  . Bilateral ovarian cysts   . Hx of gallstones   . Hernia, hiatal   . Renal cysts, acquired, bilateral   . GERD (gastroesophageal reflux disease)   . Diverticulosis   . Lumbar spinal stenosis   . Diabetes mellitus without complication ([facility])   . Left knee DJD   . Palpitations 12/06/2013  . Morbid obesity ([facility]) 12/06/2013  . Fatigue 12/06/2013  . PVC (premature ventricular contraction)  12/06/2013  . Essential hypertension   . Hypothyroidism   . Hyperlipidemia LDL goal <100     Barton Want, PT 12/04/20 1:10 PM   [facility] [address] [place], [place], [address] Phone: [phone]   Fax:  [phone]  Name: Tricia Harrison MRN: [id number] Date of Birth: [birth year]

## 2020-12-04 NOTE — Patient Instructions (Signed)
Access Code: [email or web address] URL: [email or web address] Date: 12/04/2020 Prepared by: Venetia Night Laquanna Veazey  Exercises Seated Cervical Rotation AROM - 3 x daily - 7 x weekly - 1 sets - 10 reps Seated Cervical Sidebending AROM - 3 x daily - 7 x weekly - 1 sets - 10 reps Seated Cervical Retraction - 3 x daily - 7 x weekly - 1 sets - 10 reps - 5 hold Seated Backward Shoulder Rolls - 3 x daily - 7 x weekly - 1 sets - 10 reps Seated Scapular Retraction - 3 x daily - 7 x weekly - 1 sets - 10 reps Seated March - 3 x daily - 7 x weekly - 1 sets - 20 reps Seated Long Arc Quad - 3 x daily - 7 x weekly - 2 sets - 10 reps Sit to Stand with Counter Support - 3 x daily - 7 x weekly - 1 sets - 5 reps Side to Side Weight Shift with Counter Support - 3 x daily - 7 x weekly - 1 sets - 15 reps Staggered Stance Forward Backward Weight Shift with Counter Support - 3 x daily - 7 x weekly - 1 sets - 15 reps

## 2020-12-12 ENCOUNTER — Ambulatory Visit: Payer: Medicare HMO | Admitting: Cardiology

## 2020-12-20 ENCOUNTER — Encounter: Payer: Medicare HMO | Admitting: Physical Therapy

## 2020-12-22 ENCOUNTER — Ambulatory Visit: Payer: Medicare HMO | Admitting: Physical Therapy

## 2020-12-22 ENCOUNTER — Encounter: Payer: Self-pay | Admitting: Physical Therapy

## 2020-12-22 ENCOUNTER — Other Ambulatory Visit: Payer: Self-pay

## 2020-12-22 DIAGNOSIS — M6281 Muscle weakness (generalized): Secondary | ICD-10-CM | POA: Diagnosis not present

## 2020-12-22 DIAGNOSIS — M545 Low back pain, unspecified: Secondary | ICD-10-CM

## 2020-12-22 DIAGNOSIS — M542 Cervicalgia: Secondary | ICD-10-CM

## 2020-12-22 DIAGNOSIS — G8929 Other chronic pain: Secondary | ICD-10-CM

## 2020-12-22 DIAGNOSIS — R2689 Other abnormalities of gait and mobility: Secondary | ICD-10-CM

## 2020-12-22 DIAGNOSIS — R293 Abnormal posture: Secondary | ICD-10-CM

## 2020-12-22 NOTE — Therapy (Signed)
[facility] [address] [place], [place], [address] Phone: [phone]   Fax:  [phone]  Physical Therapy Treatment  Patient Details  Name: Tricia Harrison MRN: [id number] Date of Birth: [birth year] Referring Provider (PT): Lujean Amel, MD   Encounter Date: 12/22/2020   PT End of Session - 12/22/20 0851    Visit Number 2    Date for PT Re-Evaluation 02/26/21    Authorization Type [employer] Medicare    Progress Note Due on Visit 10    PT Start Time [phone]    PT Stop Time 0930    PT Time Calculation (min) 39 min    Behavior During Therapy [facility] for tasks assessed/performed           Past Medical History:  Diagnosis Date  . Anemia    as a child  . Asthma    as a child  . Chronic back pain    spinal stenosis and buldging disc;scoliosis  . Chronic constipation    occasionally takes something OTC  . Diabetes mellitus without complication ([place])    per pt "Pre" for 20+yrs  . Diverticulosis   . Gallstones    has known about this for 3-31yrs  . GERD (gastroesophageal reflux disease)    takes Omeprazole daily  . H/O hiatal hernia   . Hemorrhoids   . History of blood transfusion    no abnormal reaction noted  . History of bronchitis    states its been a long time ago  . History of gout   . HLD (hyperlipidemia)    takes Fenofibrate daily  . HTN (hypertension)    takes Diltiazem and Ramipril daily  . Hypothyroidism    takes Synthroid daily  . Left knee DJD   . Nocturia   . Palpitations    started in 2013 and only occasionally;last time noticed about 2-3wks ago and after drinking caffeine  . PONV (postoperative nausea and vomiting)   . 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    Past Surgical History:  Procedure Laterality Date  . COLONOSCOPY    . CT ANGIOGRAM OF CHEST - PE  PROTOCOL  07/2018   Bilateral nonocclusive pulmonary emboli evidence of right heart strain consistent with "submassive "/intermediate PE.  -->  Code PE called.  Marland Kitchen DILATION AND CURETTAGE OF UTERUS     multiple about 6-7 times  . ESOPHAGOGASTRODUODENOSCOPY    . NM VQ LUNG SCAN ([place] HX)  11/23/2018   Low probability for PE  . RIGHT/LEFT HEART CATH AND CORONARY ANGIOGRAPHY N/A 08/19/2018   Procedure: RIGHT/LEFT HEART CATH AND CORONARY ANGIOGRAPHY;  Surgeon: Jettie Booze, MD;  Location: [place] [facility];  Service: Cardiovascular:: Nonobstructive CAD.  Mild pulmonary pretension.  Mean PA pressure 26 mmHg with PA P 48/20 mmHg.  Normal LVEDP.  . THYROID SURGERY Right 2010  . TOTAL HIP ARTHROPLASTY Left 2005  . TOTAL KNEE ARTHROPLASTY Left 02/21/2014   Procedure: TOTAL KNEE ARTHROPLASTY;  Surgeon: Lorn Junes, MD;  Location: [place];  Service: Orthopedics;  Laterality: Left;  . TRANSTHORACIC ECHOCARDIOGRAM  11/2019   Normal EF 60 to 65%.  Moderate LVH-GR one DD.  R WMA.  Mild aortic sclerosis with trivial AI.  Grossly normal pulmonary valve) with no significant dilatation.  Normal RV and RA size.  Normal RV function.  Normal  PA pressures.  . TRANSTHORACIC ECHOCARDIOGRAM  07/2018    (In setting of acute PE) mild LVH.  EF 55 to 60%.  GR 1 DD.  Aortic sclerosis but no stenosis.  Mild regurgitation.  Mild RA dilation.  Moderate LA dilation.  Moderate tricuspid regurgitation with severe primary hypertension estimated PA pressure 71 mmHg.  RV poorly visualized  . TRANSTHORACIC ECHOCARDIOGRAM  11/16/2018   EF 60-65%.  GR 1 DD.  Focal basal hypertrophy.  Moderate aortic regurgitation.  No stenosis.  Mild LA dilation.  Normal RV size and function.  Moderate PI but mild TR.  Peak PA pressure is now estimated 38 mmHg.  Marland Kitchen VAGINAL HYSTERECTOMY  1979    There were no vitals filed for this visit.   Subjective Assessment - 12/22/20 0852    Subjective Took some Tylenol this AM so my pain isn't too bad.     Pertinent History Lt THR 2005, Lt TKR 2015, spinal stenosis, scoliosis, DM, HLD, HTN, Hx of PE    Currently in Pain? Yes    Pain Score 6     Pain Location Back    Pain Orientation Lower    Pain Descriptors / Indicators Dull;Aching;Sore    Aggravating Factors  walking, standing    Pain Relieving Factors sit    Multiple Pain Sites No                             [facility] Adult PT Treatment/Exercise - 12/22/20 0001      Neck Exercises: Seated   Cervical Rotation Both;20 reps    Lateral Flexion Both;20 reps    Shoulder Rolls Backwards;Forwards;10 reps    Other Seated Exercise Scap squeezes 10x      Knee/Hip Exercises: Aerobic   Nustep L1x 8 min      Knee/Hip Exercises: Standing   Gait Training 2 min with RW: VC for posture, core and confidence in using her LE      Knee/Hip Exercises: Seated   Long Arc Quad AROM;Strengthening;Both;1 set;10 reps    Marching AROM;Strengthening;Both;1 set;10 reps    Sit to [employer] 2 sets;5 reps;with UE support   VC to initiate with LE vs UE                   PT Short Term Goals - 12/22/20 0910      PT SHORT TERM GOAL #1   Title Pt will be ind with initial HEP    Time 3    Period Weeks    Status Achieved    Target Date 12/25/20             PT Long Term Goals - 12/04/20 1303      PT LONG TERM GOAL #1   Title Pt will improve LE strength to at least 4/5 bil for improved ease with transfers in/out of car, bed and chair    Time 12    Period Weeks    Status New    Target Date 02/26/21      PT LONG TERM GOAL #2   Title Pt will be able to particpate in 6 min walk test with AD and short breaks as needed to demo improved gait endurance.    Time 12    Period Weeks    Status New    Target Date 02/26/21      PT LONG TERM GOAL #3   Title Pt will demo reduced fall risk by reducing  both TUG time and 5x sit to stand with no AD or least restrictive AD to </= 24 sec.    Baseline 34 sec for TUG, 34 sec for 5x sit to stand     Time 12    Period Weeks    Status New    Target Date 02/26/21      PT LONG TERM GOAL #4   Title Pt will tolerate light household standing tasks up to 15 min for improved meal prep and light cleaning.    Time 12    Period Weeks    Status New    Target Date 02/26/21      PT LONG TERM GOAL #5   Title Pt will report improved confidence in balance and stability with short distance community ambulation by at least 50%.    Time 12    Period Weeks    Status New    Target Date 02/26/21                 Plan - 12/22/20 [id number]    Clinical Impression Statement Pt independent and compliant with initial HEP. Pt was able to walk today for 2 min with her RW in the clinic today. VC mainly for her posture while ambulating , was able to perform a partial correction for the 2 min. pt was able to initiate sit to stand with her LE vs 100% with her UE today, still relies heavily on UE to lower into the chair. No increased back pain during todays session.    Personal Factors and Comorbidities Age;Comorbidity 1;Comorbidity 2;Comorbidity 3+    Comorbidities spinal stenosis, Hx of THR/TKR on Lt, Hx of bil PE, cardiovascular deconditioning    Examination-Activity Limitations Locomotion Level;Transfers;Bed Mobility;Bend;Squat;Dressing;Stairs;Stand    Examination-Participation Restrictions Meal Prep;Community Activity;Driving;Laundry;Shop;Cleaning    Stability/Clinical Decision Making Evolving/Moderate complexity    Rehab Potential Good    PT Frequency 2x / week    PT Duration 12 weeks    PT Treatment/Interventions ADLs/Self Care Home Management;Aquatic Therapy;Electrical Stimulation;Moist Heat;DME Instruction;Functional mobility training;Gait training;Therapeutic activities;Therapeutic exercise;Balance training;Neuromuscular re-education;Manual techniques;Patient/family education;Passive range of motion;Joint Manipulations;Spinal Manipulations;Taping    PT Next Visit Plan nustep, gait, continue with postural  strength and endurance    PT Home Exercise Plan Access Code: [email or web address]           Patient will benefit from skilled therapeutic intervention in order to improve the following deficits and impairments:  Abnormal gait,Decreased range of motion,Difficulty walking,Cardiopulmonary status limiting activity,Decreased endurance,Pain,Decreased activity tolerance,Decreased balance,Impaired flexibility,Hypomobility,Improper body mechanics,Postural dysfunction,Decreased strength,Decreased mobility  Visit Diagnosis: Muscle weakness (generalized)  Chronic midline low back pain without sciatica  Cervicalgia  Abnormal posture  Other abnormalities of gait and mobility     Problem List Patient Active Problem List   Diagnosis Date Noted  . Obstructive sleep apnea 03/09/2019  . Aortic regurgitation 12/02/2018  . Pulmonary embolism ([place]) 08/20/2018  . Elevated troponin 08/20/2018  . DOE (dyspnea on exertion)   . 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  . Bilateral ovarian cysts   . Hx of gallstones   . Hernia, hiatal   . Renal cysts, acquired, bilateral   . GERD (gastroesophageal reflux disease)   . Diverticulosis   . Lumbar spinal stenosis   . Diabetes mellitus without complication ([place])   . Left knee DJD   . Palpitations 12/06/2013  . Morbid obesity ([place]) 12/06/2013  . Fatigue 12/06/2013  . PVC (premature ventricular contraction) 12/06/2013  . Essential hypertension   . Hypothyroidism   .  Hyperlipidemia LDL goal <100     Shalene Gallen, PTA 12/22/2020, 9:24 AM   [facility] [address], [place], [place], [address] Phone: [phone]   Fax:  [phone]  Name: Eunice Winecoff MRN: [id number] Date of Birth: [birth year]

## 2020-12-25 ENCOUNTER — Other Ambulatory Visit: Payer: Self-pay

## 2020-12-25 ENCOUNTER — Encounter: Payer: Self-pay | Admitting: Physical Therapy

## 2020-12-25 ENCOUNTER — Ambulatory Visit: Payer: Medicare HMO | Admitting: Physical Therapy

## 2020-12-25 DIAGNOSIS — R293 Abnormal posture: Secondary | ICD-10-CM

## 2020-12-25 DIAGNOSIS — G8929 Other chronic pain: Secondary | ICD-10-CM

## 2020-12-25 DIAGNOSIS — M6281 Muscle weakness (generalized): Secondary | ICD-10-CM

## 2020-12-25 DIAGNOSIS — M545 Low back pain, unspecified: Secondary | ICD-10-CM

## 2020-12-25 NOTE — Therapy (Signed)
[facility] [address] [place], [place], [address] Phone: [phone]   Fax:  [phone]  Physical Therapy Treatment  Patient Details  Name: Tricia Harrison MRN: [id number] Date of Birth: 09/13/1946 Referring Provider (PT): Lujean Amel, MD   Encounter Date: 12/25/2020   PT End of Session - 12/25/20 0852    Visit Number 3    Date for PT Re-Evaluation 02/26/21    Authorization Type [employer]na Medicare    Progress Note Due on Visit 10    PT Start Time 0845    PT Stop Time 0930    PT Time Calculation (min) 45 min    Activity Tolerance Patient tolerated treatment well;Patient limited by fatigue;Patient limited by pain    Behavior During Therapy [facility] for tasks assessed/performed           Past Medical History:  Diagnosis Date  . Anemia    as a child  . Asthma    as a child  . Chronic back pain    spinal stenosis and buldging disc;scoliosis  . Chronic constipation    occasionally takes something OTC  . Diabetes mellitus without complication ([place])    per pt "Pre" for 20+yrs  . Diverticulosis   . Gallstones    has known about this for 3-28yrs  . GERD (gastroesophageal reflux disease)    takes Omeprazole daily  . H/O hiatal hernia   . Hemorrhoids   . History of blood transfusion    no abnormal reaction noted  . History of bronchitis    states its been a long time ago  . History of gout   . HLD (hyperlipidemia)    takes Fenofibrate daily  . HTN (hypertension)    takes Diltiazem and Ramipril daily  . Hypothyroidism    takes Synthroid daily  . Left knee DJD   . Nocturia   . Palpitations    started in 2013 and only occasionally;last time noticed about 2-3wks ago and after drinking caffeine  . PONV (postoperative nausea and vomiting)   . 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     Past Surgical History:  Procedure Laterality Date  . COLONOSCOPY    . CT ANGIOGRAM OF CHEST - PE PROTOCOL  07/2018   Bilateral nonocclusive pulmonary emboli evidence of right heart strain consistent with "submassive "/intermediate PE.  -->  Code PE called.  Marland Kitchen DILATION AND CURETTAGE OF UTERUS     multiple about 6-7 times  . ESOPHAGOGASTRODUODENOSCOPY    . NM VQ LUNG SCAN ([place] HX)  11/23/2018   Low probability for PE  . RIGHT/LEFT HEART CATH AND CORONARY ANGIOGRAPHY N/A 08/19/2018   Procedure: RIGHT/LEFT HEART CATH AND CORONARY ANGIOGRAPHY;  Surgeon: Jettie Booze, MD;  Location: [place] [facility];  Service: Cardiovascular:: Nonobstructive CAD.  Mild pulmonary pretension.  Mean PA pressure 26 mmHg with PA P 48/20 mmHg.  Normal LVEDP.  . THYROID SURGERY Right 2010  . TOTAL HIP ARTHROPLASTY Left 2005  . TOTAL KNEE ARTHROPLASTY Left 02/21/2014   Procedure: TOTAL KNEE ARTHROPLASTY;  Surgeon: Lorn Junes, MD;  Location: [place];  Service: Orthopedics;  Laterality: Left;  . TRANSTHORACIC ECHOCARDIOGRAM  11/2019   Normal EF 60 to 65%.  Moderate LVH-GR one DD.  R WMA.  Mild aortic sclerosis with trivial AI.  Grossly normal pulmonary valve) with  no significant dilatation.  Normal RV and RA size.  Normal RV function.  Normal PA pressures.  . TRANSTHORACIC ECHOCARDIOGRAM  07/2018    (In setting of acute PE) mild LVH.  EF 55 to 60%.  GR 1 DD.  Aortic sclerosis but no stenosis.  Mild regurgitation.  Mild RA dilation.  Moderate LA dilation.  Moderate tricuspid regurgitation with severe primary hypertension estimated PA pressure 71 mmHg.  RV poorly visualized  . TRANSTHORACIC ECHOCARDIOGRAM  11/16/2018   EF 60-65%.  GR 1 DD.  Focal basal hypertrophy.  Moderate aortic regurgitation.  No stenosis.  Mild LA dilation.  Normal RV size and function.  Moderate PI but mild TR.  Peak PA pressure is now estimated 38 mmHg.  Marland Kitchen VAGINAL HYSTERECTOMY  1979    There were no vitals filed for this visit.    Subjective Assessment - 12/25/20 0849    Subjective I did fine after last visit - just tired.  Took Tylenol this morning for my back pain.  My legs are getting stronger - I'm having an easier time standing up from chair and toilet.  My back pain limits me more than my leg weakness now.    Pertinent History Lt THR 2005, Lt TKR 2015, spinal stenosis, scoliosis, DM, HLD, HTN, Hx of PE    How long can you stand comfortably? <5 min    How long can you walk comfortably? household distances, very fatiguing    Patient Stated Goals stand up and walk by myself, no water therapy while it's cold    Pain Score 6     Pain Location Back    Pain Orientation Lower    Pain Descriptors / Indicators Dull;Aching;Sore    Pain Type Chronic pain    Pain Onset More than a month ago    Pain Frequency Intermittent    Aggravating Factors  walk/stand    Pain Relieving Factors sit                             [facility] Adult PT Treatment/Exercise - 12/25/20 0001      Self-Care   Self-Care Other Self-Care Comments    Other Self-Care Comments  seated inhale slow through nose over 4 count with barrel breath cue, hold briefly then exhale, PT gave VC and TCs, encouraged Pt to do this in sitting only for safety several times a day x 10 reps      Exercises   Exercises Lumbar;Knee/Hip      Neck Exercises: Seated   Neck Retraction 1 rep    Neck Retraction Limitations held throughout scapular retraction    Other Seated Exercise Scap squeezes 20x      Lumbar Exercises: Aerobic   Nustep L1 x 9' PT present to discuss status and monitor pain/fatigue, legs only last 2 min      Knee/Hip Exercises: Stretches   Active Hamstring Stretch Left;60 seconds    Active Hamstring Stretch Limitations heel prop seated heel on towel for hamstring and knee stretch x 1'      Knee/Hip Exercises: Standing   Gait Training 160' with RW x 2 rounds, 1st round covered 160' in 2", 2nd round needed seated break and took 2'40" limited  by cardiovascular endurance and neck pain > LBP and leg fatigue      Knee/Hip Exercises: Seated   Long Arc Quad AROM;Strengthening;2 sets;5 reps    Marching Strengthening;AROM;20 reps    Sit to [employer] 1  set;5 reps;with UE support   light UE support only, much improved LE use     Manual Therapy   Manual Therapy Joint mobilization;Soft tissue mobilization    Joint Mobilization upper t-spine bil for facet ext    Soft tissue mobilization bil upper traps                    PT Short Term Goals - 12/22/20 0910      PT SHORT TERM GOAL #1   Title Pt will be ind with initial HEP    Time 3    Period Weeks    Status Achieved    Target Date 12/25/20             PT Long Term Goals - 12/04/20 1303      PT LONG TERM GOAL #1   Title Pt will improve LE strength to at least 4/5 bil for improved ease with transfers in/out of car, bed and chair    Time 12    Period Weeks    Status New    Target Date 02/26/21      PT LONG TERM GOAL #2   Title Pt will be able to particpate in 6 min walk test with AD and short breaks as needed to demo improved gait endurance.    Time 12    Period Weeks    Status New    Target Date 02/26/21      PT LONG TERM GOAL #3   Title Pt will demo reduced fall risk by reducing both TUG time and 5x sit to stand with no AD or least restrictive AD to </= 24 sec.    Baseline 34 sec for TUG, 34 sec for 5x sit to stand    Time 12    Period Weeks    Status New    Target Date 02/26/21      PT LONG TERM GOAL #4   Title Pt will tolerate light household standing tasks up to 15 min for improved meal prep and light cleaning.    Time 12    Period Weeks    Status New    Target Date 02/26/21      PT LONG TERM GOAL #5   Title Pt will report improved confidence in balance and stability with short distance community ambulation by at least 50%.    Time 12    Period Weeks    Status New    Target Date 02/26/21                 Plan - 12/25/20 0936     Clinical Impression Statement Pt with much improved LE use with sit to stand today with light UE use only.  She was able to participate in 2 rounds of 2 laps around clinic ([place] today, taking 2' first round and 2'40" second round with need for a seated break during 2nd lap.  She is limited by cardiovascular endurance more than by neck and back pain, which she also experiences with gait.  She had pain with neck retraction today but this was improved following joint mobs for improrved upper t-spine ext and soft tissue elongation.  PT coached Pt on seated breathwork given PMH involving lungs, cueing her to take deep diaphragmatic breaths over 4 seconds vs quick shallow breaths.  She was able to demo with TCs by PT at lateral and posterior ribcage.  Pt will continue to benefit from skilled PT to address defecits  and improve overall endurance, strength and mobility.    Comorbidities spinal stenosis, Hx of THR/TKR on Lt, Hx of bil PE, cardiovascular deconditioning    Rehab Potential Good    PT Frequency 2x / week    PT Duration 12 weeks    PT Treatment/Interventions ADLs/Self Care Home Management;Aquatic Therapy;Electrical Stimulation;Moist Heat;DME Instruction;Functional mobility training;Gait training;Therapeutic activities;Therapeutic exercise;Balance training;Neuromuscular re-education;Manual techniques;Patient/family education;Passive range of motion;Joint Manipulations;Spinal Manipulations;Taping    PT Next Visit Plan nustep, gait, continue with postural strength and endurance, f/u on seated deep slow breathing for lung expansion, manual to base of neck as needed for improved pain with neck retraction    PT Home Exercise Plan Access Code: [email or web address]    Consulted and Agree with Plan of Care Patient           Patient will benefit from skilled therapeutic intervention in order to improve the following deficits and impairments:     Visit Diagnosis: Muscle weakness (generalized)  Chronic  midline low back pain without sciatica  Abnormal posture     Problem List Patient Active Problem List   Diagnosis Date Noted  . Obstructive sleep apnea 03/09/2019  . Aortic regurgitation 12/02/2018  . Pulmonary embolism ([place]) 08/20/2018  . Elevated troponin 08/20/2018  . DOE (dyspnea on exertion)   . 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  . Bilateral ovarian cysts   . Hx of gallstones   . Hernia, hiatal   . Renal cysts, acquired, bilateral   . GERD (gastroesophageal reflux disease)   . Diverticulosis   . Lumbar spinal stenosis   . Diabetes mellitus without complication ([place])   . Left knee DJD   . Palpitations 12/06/2013  . Morbid obesity ([place]) 12/06/2013  . Fatigue 12/06/2013  . PVC (premature ventricular contraction) 12/06/2013  . Essential hypertension   . Hypothyroidism   . Hyperlipidemia LDL goal <100     Jefferey Lippmann E Jong Rickman 12/25/2020, 9:42 AM  [facility] [address] [place], [place], [address] Phone: [phone]   Fax:  [phone]  Name: Nicholle Coldren MRN: [id number] Date of Birth: Dec 28, 1945

## 2020-12-27 ENCOUNTER — Other Ambulatory Visit: Payer: Self-pay

## 2020-12-27 ENCOUNTER — Encounter: Payer: Self-pay | Admitting: Physical Therapy

## 2020-12-27 ENCOUNTER — Ambulatory Visit: Payer: Medicare HMO | Attending: Family Medicine | Admitting: Physical Therapy

## 2020-12-27 DIAGNOSIS — R293 Abnormal posture: Secondary | ICD-10-CM | POA: Insufficient documentation

## 2020-12-27 DIAGNOSIS — M542 Cervicalgia: Secondary | ICD-10-CM | POA: Insufficient documentation

## 2020-12-27 DIAGNOSIS — G8929 Other chronic pain: Secondary | ICD-10-CM | POA: Diagnosis present

## 2020-12-27 DIAGNOSIS — M545 Low back pain, unspecified: Secondary | ICD-10-CM | POA: Diagnosis present

## 2020-12-27 DIAGNOSIS — M6281 Muscle weakness (generalized): Secondary | ICD-10-CM | POA: Insufficient documentation

## 2020-12-27 DIAGNOSIS — R2689 Other abnormalities of gait and mobility: Secondary | ICD-10-CM | POA: Diagnosis present

## 2020-12-27 NOTE — Therapy (Signed)
[facility] [address] [place], [place], [address] Phone: [phone]   Fax:  [phone]  Physical Therapy Treatment  Patient Details  Name: Tricia Harrison MRN: [id number] Date of Birth: Sep 27, 1946 Referring Provider (PT): Lujean Amel, MD   Encounter Date: 12/27/2020   PT End of Session - 12/27/20 1233    Visit Number 4    Date for PT Re-Evaluation 02/26/21    Authorization Type [employer] Medicare    Progress Note Due on Visit 10    PT Start Time 1230    PT Stop Time 1310    PT Time Calculation (min) 40 min    Activity Tolerance Patient tolerated treatment well;Patient limited by pain    Behavior During Therapy [facility] for tasks assessed/performed           Past Medical History:  Diagnosis Date  . Anemia    as a child  . Asthma    as a child  . Chronic back pain    spinal stenosis and buldging disc;scoliosis  . Chronic constipation    occasionally takes something OTC  . Diabetes mellitus without complication ([place])    per pt "Pre" for 20+yrs  . Diverticulosis   . Gallstones    has known about this for 3-53yrs  . GERD (gastroesophageal reflux disease)    takes Omeprazole daily  . H/O hiatal hernia   . Hemorrhoids   . History of blood transfusion    no abnormal reaction noted  . History of bronchitis    states its been a long time ago  . History of gout   . HLD (hyperlipidemia)    takes Fenofibrate daily  . HTN (hypertension)    takes Diltiazem and Ramipril daily  . Hypothyroidism    takes Synthroid daily  . Left knee DJD   . Nocturia   . Palpitations    started in 2013 and only occasionally;last time noticed about 2-3wks ago and after drinking caffeine  . PONV (postoperative nausea and vomiting)   . 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    Past Surgical History:   Procedure Laterality Date  . COLONOSCOPY    . CT ANGIOGRAM OF CHEST - PE PROTOCOL  07/2018   Bilateral nonocclusive pulmonary emboli evidence of right heart strain consistent with "submassive "/intermediate PE.  -->  Code PE called.  Marland Kitchen DILATION AND CURETTAGE OF UTERUS     multiple about 6-7 times  . ESOPHAGOGASTRODUODENOSCOPY    . NM VQ LUNG SCAN ([place] HX)  11/23/2018   Low probability for PE  . RIGHT/LEFT HEART CATH AND CORONARY ANGIOGRAPHY N/A 08/19/2018   Procedure: RIGHT/LEFT HEART CATH AND CORONARY ANGIOGRAPHY;  Surgeon: Jettie Booze, MD;  Location: [place] [facility];  Service: Cardiovascular:: Nonobstructive CAD.  Mild pulmonary pretension.  Mean PA pressure 26 mmHg with PA P 48/20 mmHg.  Normal LVEDP.  . THYROID SURGERY Right 2010  . TOTAL HIP ARTHROPLASTY Left 2005  . TOTAL KNEE ARTHROPLASTY Left 02/21/2014   Procedure: TOTAL KNEE ARTHROPLASTY;  Surgeon: Lorn Junes, MD;  Location: [place];  Service: Orthopedics;  Laterality: Left;  . TRANSTHORACIC ECHOCARDIOGRAM  11/2019   Normal EF 60 to 65%.  Moderate LVH-GR one DD.  R WMA.  Mild aortic sclerosis with trivial AI.  Grossly normal pulmonary valve) with no significant dilatation.  Normal RV and RA size.  Normal RV function.  Normal PA pressures.  . TRANSTHORACIC ECHOCARDIOGRAM  07/2018    (In setting of acute PE) mild LVH.  EF 55 to 60%.  GR 1 DD.  Aortic sclerosis but no stenosis.  Mild regurgitation.  Mild RA dilation.  Moderate LA dilation.  Moderate tricuspid regurgitation with severe primary hypertension estimated PA pressure 71 mmHg.  RV poorly visualized  . TRANSTHORACIC ECHOCARDIOGRAM  11/16/2018   EF 60-65%.  GR 1 DD.  Focal basal hypertrophy.  Moderate aortic regurgitation.  No stenosis.  Mild LA dilation.  Normal RV size and function.  Moderate PI but mild TR.  Peak PA pressure is now estimated 38 mmHg.  Marland Kitchen VAGINAL HYSTERECTOMY  1979    There were no vitals filed for this visit.   Subjective Assessment -  12/27/20 1234    Subjective I have had a bad morning. I am shakey, maybe from my medications, and i drove for the first time by myself in over a year so I have been worked up all day. Taking Tylenol before I go to therapy.    Pertinent History Lt THR 2005, Lt TKR 2015, spinal stenosis, scoliosis, DM, HLD, HTN, Hx of PE    Currently in Pain? No/denies                             [facility] Adult PT Treatment/Exercise - 12/27/20 0001      Lumbar Exercises: Aerobic   Nustep L1 x 10 min with PTA present added MHP to back for relaxation/comfort/anxiety.      Knee/Hip Exercises: Seated   Long Arc Quad Strengthening;Both;1 set;15 reps;Weights    Long Arc Quad Weight 2 lbs.    Clamshell with TheraBand Green   loop 15x   Marching Strengthening;AROM;20 reps   2x10   Marching Weights 2 lbs.    Sit to Sand with UE support;2 sets;5 reps   light UE support only, much improved LE use     Manual Therapy   Soft tissue mobilization bil upper traps, Lt scalenes                    PT Short Term Goals - 12/22/20 0910      PT SHORT TERM GOAL #1   Title Pt will be ind with initial HEP    Time 3    Period Weeks    Status Achieved    Target Date 12/25/20             PT Long Term Goals - 12/04/20 1303      PT LONG TERM GOAL #1   Title Pt will improve LE strength to at least 4/5 bil for improved ease with transfers in/out of car, bed and chair    Time 12    Period Weeks    Status New    Target Date 02/26/21      PT LONG TERM GOAL #2   Title Pt will be able to particpate in 6 min walk test with AD and short breaks as needed to demo improved gait endurance.    Time 12    Period Weeks    Status New    Target Date 02/26/21      PT LONG TERM GOAL #3   Title Pt will demo reduced fall risk by reducing both TUG time and 5x sit to stand with no AD or least restrictive AD to </=  24 sec.    Baseline 34 sec for TUG, 34 sec for 5x sit to stand    Time 12    Period Weeks     Status New    Target Date 02/26/21      PT LONG TERM GOAL #4   Title Pt will tolerate light household standing tasks up to 15 min for improved meal prep and light cleaning.    Time 12    Period Weeks    Status New    Target Date 02/26/21      PT LONG TERM GOAL #5   Title Pt will report improved confidence in balance and stability with short distance community ambulation by at least 50%.    Time 12    Period Weeks    Status New    Target Date 02/26/21                 Plan - 12/27/20 1233    Clinical Impression Statement Pt arrives with increased anxiety most likey because she drove to PT by herself for the first time today in over a year. Pt was visablly shaking when she came back to the gym, this improved over the course of our treatment. Pt was able to add light ankle weights for continued work on her LE strength. resting tone of cervical spine improved with soft tissue work.    Personal Factors and Comorbidities Age;Comorbidity 1;Comorbidity 2;Comorbidity 3+    Comorbidities spinal stenosis, Hx of THR/TKR on Lt, Hx of bil PE, cardiovascular deconditioning    Examination-Activity Limitations Locomotion Level;Transfers;Bed Mobility;Bend;Squat;Dressing;Stairs;Stand    Examination-Participation Restrictions Meal Prep;Community Activity;Driving;Laundry;Shop;Cleaning    Stability/Clinical Decision Making Evolving/Moderate complexity    Rehab Potential Good    PT Frequency 2x / week    PT Duration 12 weeks    PT Treatment/Interventions ADLs/Self Care Home Management;Aquatic Therapy;Electrical Stimulation;Moist Heat;DME Instruction;Functional mobility training;Gait training;Therapeutic activities;Therapeutic exercise;Balance training;Neuromuscular re-education;Manual techniques;Patient/family education;Passive range of motion;Joint Manipulations;Spinal Manipulations;Taping    PT Next Visit Plan nustep, gait, continue with postural strength and endurance, f/u on seated deep slow  breathing for lung expansion, manual to base of neck as needed for improved pain with neck retraction    PT Home Exercise Plan Access Code: [email or web address]    Consulted and Agree with Plan of Care Patient           Patient will benefit from skilled therapeutic intervention in order to improve the following deficits and impairments:  Abnormal gait,Decreased range of motion,Difficulty walking,Cardiopulmonary status limiting activity,Decreased endurance,Pain,Decreased activity tolerance,Decreased balance,Impaired flexibility,Hypomobility,Improper body mechanics,Postural dysfunction,Decreased strength,Decreased mobility  Visit Diagnosis: Muscle weakness (generalized)  Chronic midline low back pain without sciatica  Abnormal posture  Cervicalgia  Other abnormalities of gait and mobility     Problem List Patient Active Problem List   Diagnosis Date Noted  . Obstructive sleep apnea 03/09/2019  . Aortic regurgitation 12/02/2018  . Pulmonary embolism ([place]) 08/20/2018  . Elevated troponin 08/20/2018  . DOE (dyspnea on exertion)   . 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  . Bilateral ovarian cysts   . Hx of gallstones   . Hernia, hiatal   . Renal cysts, acquired, bilateral   . GERD (gastroesophageal reflux disease)   . Diverticulosis   . Lumbar spinal stenosis   . Diabetes mellitus without complication ([place])   . Left knee DJD   . Palpitations 12/06/2013  . Morbid obesity ([place]) 12/06/2013  . Fatigue 12/06/2013  . PVC (premature ventricular contraction) 12/06/2013  . Essential hypertension   .  Hypothyroidism   . Hyperlipidemia LDL goal <100     Dandria Griego, PTA 12/27/2020, 1:12 PM   [facility] [address], [place], [place], [address] Phone: [phone]   Fax:  [phone]  Name: Laurelai Lepp MRN: [id number] Date of Birth: 09-Nov-1946

## 2021-01-01 ENCOUNTER — Ambulatory Visit: Payer: Medicare HMO | Admitting: Cardiology

## 2021-01-01 ENCOUNTER — Other Ambulatory Visit: Payer: Self-pay

## 2021-01-01 ENCOUNTER — Ambulatory Visit: Payer: Medicare HMO | Admitting: Physical Therapy

## 2021-01-01 ENCOUNTER — Encounter: Payer: Self-pay | Admitting: Physical Therapy

## 2021-01-01 DIAGNOSIS — M6281 Muscle weakness (generalized): Secondary | ICD-10-CM

## 2021-01-01 DIAGNOSIS — M545 Low back pain, unspecified: Secondary | ICD-10-CM

## 2021-01-01 DIAGNOSIS — M542 Cervicalgia: Secondary | ICD-10-CM

## 2021-01-01 DIAGNOSIS — G8929 Other chronic pain: Secondary | ICD-10-CM

## 2021-01-01 DIAGNOSIS — R2689 Other abnormalities of gait and mobility: Secondary | ICD-10-CM

## 2021-01-01 DIAGNOSIS — R293 Abnormal posture: Secondary | ICD-10-CM

## 2021-01-01 NOTE — Therapy (Signed)
[facility] [address] [place], [place], [address] Phone: [phone]   Fax:  [phone]  Physical Therapy Treatment  Patient Details  Name: Tricia Harrison MRN: [id number] Date of Birth: [birth year] Referring Provider (PT): Lujean Amel, MD   Encounter Date: 01/01/2021   PT End of Session - 01/01/21 0939    Visit Number 5    Date for PT Re-Evaluation 02/26/21    Authorization Type Aetna Medicare    Progress Note Due on Visit 10    PT Start Time 0930    PT Stop Time 1014    PT Time Calculation (min) 44 min    Activity Tolerance Patient tolerated treatment well;Patient limited by pain    Behavior During Therapy [facility] for tasks assessed/performed           Past Medical History:  Diagnosis Date  . Anemia    as a child  . Asthma    as a child  . Chronic back pain    spinal stenosis and buldging disc;scoliosis  . Chronic constipation    occasionally takes something OTC  . Diabetes mellitus without complication ([place])    per pt "Pre" for 20+yrs  . Diverticulosis   . Gallstones    has known about this for 3-40yr  . GERD (gastroesophageal reflux disease)    takes Omeprazole daily  . H/O hiatal hernia   . Hemorrhoids   . History of blood transfusion    no abnormal reaction noted  . History of bronchitis    states its been a long time ago  . History of gout   . HLD (hyperlipidemia)    takes Fenofibrate daily  . HTN (hypertension)    takes Diltiazem and Ramipril daily  . Hypothyroidism    takes Synthroid daily  . Left knee DJD   . Nocturia   . Palpitations    started in 2013 and only occasionally;last time noticed about 2-3wks ago and after drinking caffeine  . PONV (postoperative nausea and vomiting)   . 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    Past Surgical History:   Procedure Laterality Date  . COLONOSCOPY    . CT ANGIOGRAM OF CHEST - PE PROTOCOL  07/2018   Bilateral nonocclusive pulmonary emboli evidence of right heart strain consistent with "submassive "/intermediate PE.  -->  Code PE called.  .Marland KitchenDILATION AND CURETTAGE OF UTERUS     multiple about 6-7 times  . ESOPHAGOGASTRODUODENOSCOPY    . NM VQ LUNG SCAN ([place])  11/23/2018   Low probability for PE  . RIGHT/LEFT HEART CATH AND CORONARY ANGIOGRAPHY N/A 08/19/2018   Procedure: RIGHT/LEFT HEART CATH AND CORONARY ANGIOGRAPHY;  Surgeon: VJettie Booze MD;  [facility];  Service: Cardiovascular:: Nonobstructive CAD.  Mild pulmonary pretension.  Mean PA pressure 26 mmHg with PA P 48/20 mmHg.  Normal LVEDP.  . THYROID SURGERY Right 2010  . TOTAL HIP ARTHROPLASTY Left 2005  . TOTAL KNEE ARTHROPLASTY Left 02/21/2014   Procedure: TOTAL KNEE ARTHROPLASTY;  Surgeon: RLorn Junes MD;  [facility]  Service: Orthopedics;  Laterality: Left;  . TRANSTHORACIC ECHOCARDIOGRAM  11/2019   Normal EF 60 to 65%.  Moderate LVH-GR one DD.  R WMA.  Mild aortic sclerosis with trivial AI.  Grossly normal pulmonary valve) with no significant dilatation.  Normal RV and RA size.  Normal RV function.  Normal PA pressures.  . TRANSTHORACIC ECHOCARDIOGRAM  07/2018    (In setting of acute PE) mild LVH.  EF 55 to 60%.  GR 1 DD.  Aortic sclerosis but no stenosis.  Mild regurgitation.  Mild RA dilation.  Moderate LA dilation.  Moderate tricuspid regurgitation with severe primary hypertension estimated PA pressure 71 mmHg.  RV poorly visualized  . TRANSTHORACIC ECHOCARDIOGRAM  11/16/2018   EF 60-65%.  GR 1 DD.  Focal basal hypertrophy.  Moderate aortic regurgitation.  No stenosis.  Mild LA dilation.  Normal RV size and function.  Moderate PI but mild TR.  Peak PA pressure is now estimated 38 mmHg.  Marland Kitchen VAGINAL HYSTERECTOMY  1979    There were no vitals filed for this visit.   Subjective Assessment -  01/01/21 0937    Subjective My sister drove me here today.  Pain in neck and low back isn't too bad - I've taken my Tylenol this morning.  I am traveling to [place] in 2 weeks with my son for my bday.    Pertinent History Lt THR 2005, Lt TKR 2015, spinal stenosis, scoliosis, DM, HLD, HTN, Hx of PE    Limitations Standing;Walking    How long can you stand comfortably? <5 min    How long can you walk comfortably? household distances, very fatiguing    Patient Stated Goals stand up and walk by myself, no water therapy while it's cold    Currently in Pain? No/denies              [facility] PT Assessment - 01/01/21 0001      Transfers   Five time sit to stand comments  22 sec      Ambulation/Gait   Gait Comments 3' walk test without break, 260' with RW      Timed Up and Go Test   TUG Normal TUG    Normal TUG (seconds) 21    TUG Comments with RW                         [facility] Adult PT Treatment/Exercise - 01/01/21 0001      Exercises   Exercises Lumbar;Knee/Hip;Neck;Shoulder      Neck Exercises: Machines for Strengthening   Nustep L1 x 10' with lumbar heat, PT present to monitor and discuss STGs      Knee/Hip Exercises: Standing   Gait Training 3 min walk test with RW x 260' report of neck and UE pain      Knee/Hip Exercises: Seated   Other Seated Knee/Hip Exercises Lt heel prop on stool x 2' with intermittent quad sets for knee ext stretch x 2'    Sit to Sand 2 sets;5 reps;with UE support      Shoulder Exercises: Seated   Extension Strengthening;Left;Right;15 reps;Theraband    Theraband Level (Shoulder Extension) Level 2 (Red)    Row Strengthening;Both;Theraband;15 reps    Theraband Level (Shoulder Row) Level 2 (Red)                    PT Short Term Goals - 01/01/21 0939      PT SHORT TERM GOAL #1   Title Pt will be ind with initial HEP    Status Achieved      PT SHORT TERM GOAL #2   Title Pt will be able to participate in 3 min walk test with  short breaks as  needed    Baseline 3:30 walk test x 260' no breaks 01/01/21    Status Achieved      PT SHORT TERM GOAL #3   Title Pt will demo 5x sit to stand using UEs and AD as needed in </= 28 sec    Baseline 22 sec 01/01/21    Status Achieved      PT SHORT TERM GOAL #4   Title Pt will reduce TUG time to </= 28 sec    Baseline 21 sec 01/01/21    Status Achieved             PT Long Term Goals - 01/01/21 0953      PT LONG TERM GOAL #3   Title Pt will demo reduced fall risk by reducing both TUG time and 5x sit to stand with no AD or least restrictive AD to </= 24 sec.    Baseline 22 sec for 5x sit to stand, 21 sec for TUG    Status Achieved                 Plan - 01/01/21 1004    Clinical Impression Statement Pt met several STGs and 1 LTG today with 5x sit to stand in 22 sec and performance of 3' walk test covering 260' without a break.  She had much improved cardiovascular endurance with 3' walk today as compared to previous visits.  TUG was performed in 21 sec.  PT updated UE seated band ther ex and seated Lt heel prop to HEP for improved UE strength with use of walker and improved Lt knee extension for gait and standing.  Continue along POC.    Comorbidities spinal stenosis, Hx of THR/TKR on Lt, Hx of bil PE, cardiovascular deconditioning    PT Next Visit Plan nustep, gait, continue with postural strength and endurance, f/u on seated deep slow breathing for lung expansion, manual to base of neck as needed for improved pain with neck retraction    PT Home Exercise Plan Access Code: [email or web address]    Consulted and Agree with Plan of Care Patient           Patient will benefit from skilled therapeutic intervention in order to improve the following deficits and impairments:     Visit Diagnosis: Muscle weakness (generalized)  Chronic midline low back pain without sciatica  Abnormal posture  Cervicalgia  Other abnormalities of gait and mobility     Problem List Patient  Active Problem List   Diagnosis Date Noted  . Obstructive sleep apnea 03/09/2019  . Aortic regurgitation 12/02/2018  . Pulmonary embolism ([place]) 08/20/2018  . Elevated troponin 08/20/2018  . DOE (dyspnea on exertion)   . 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  . Bilateral ovarian cysts   . Hx of gallstones   . Hernia, hiatal   . Renal cysts, acquired, bilateral   . GERD (gastroesophageal reflux disease)   . Diverticulosis   . Lumbar spinal stenosis   . Diabetes mellitus without complication ([place])   . Left knee DJD   . Palpitations 12/06/2013  . Morbid obesity ([place]) 12/06/2013  . Fatigue 12/06/2013  . PVC (premature ventricular contraction) 12/06/2013  . Essential hypertension   . Hypothyroidism   . Hyperlipidemia LDL goal <100     Meshulem Onorato E Miguelina Fore 01/01/2021, 10:18 AM  [facility] [address], [place], [place], [address] Phone: [phone]   Fax:  [phone]  Name: Tricia Harrison MRN: [id number]  Date of Birth: 03/08/1946

## 2021-01-03 ENCOUNTER — Encounter: Payer: Self-pay | Admitting: Physical Therapy

## 2021-01-03 ENCOUNTER — Ambulatory Visit: Payer: Medicare HMO | Admitting: Physical Therapy

## 2021-01-03 ENCOUNTER — Other Ambulatory Visit: Payer: Self-pay

## 2021-01-03 DIAGNOSIS — R293 Abnormal posture: Secondary | ICD-10-CM

## 2021-01-03 DIAGNOSIS — M6281 Muscle weakness (generalized): Secondary | ICD-10-CM | POA: Diagnosis not present

## 2021-01-03 DIAGNOSIS — R2689 Other abnormalities of gait and mobility: Secondary | ICD-10-CM

## 2021-01-03 DIAGNOSIS — G8929 Other chronic pain: Secondary | ICD-10-CM

## 2021-01-03 DIAGNOSIS — M542 Cervicalgia: Secondary | ICD-10-CM

## 2021-01-03 NOTE — Therapy (Signed)
[facility] [address] [place], [place], [address] Phone: [phone]   Fax:  [phone]  Physical Therapy Treatment  Patient Details  Name: Tricia Harrison MRN: [id number] Date of Birth: Apr 18, 1946 Referring Provider (PT): Lujean Amel, MD   Encounter Date: 01/03/2021   PT End of Session - 01/03/21 1022    Visit Number 6    Date for PT Re-Evaluation 02/26/21    Authorization Type [employer]    Progress Note Due on Visit 10    PT Start Time 1018    PT Stop Time 1115    PT Time Calculation (min) 57 min    Activity Tolerance Patient tolerated treatment well;Patient limited by pain    Behavior During Therapy [facility] for tasks assessed/performed           Past Medical History:  Diagnosis Date  . Anemia    as a child  . Asthma    as a child  . Chronic back pain    spinal stenosis and buldging disc;scoliosis  . Chronic constipation    occasionally takes something OTC  . Diabetes mellitus without complication ([place])    per pt "Pre" for 20+yrs  . Diverticulosis   . Gallstones    has known about this for 3-59yrs  . GERD (gastroesophageal reflux disease)    takes Omeprazole daily  . H/O hiatal hernia   . Hemorrhoids   . History of blood transfusion    no abnormal reaction noted  . History of bronchitis    states its been a long time ago  . History of gout   . HLD (hyperlipidemia)    takes Fenofibrate daily  . HTN (hypertension)    takes Diltiazem and Ramipril daily  . Hypothyroidism    takes Synthroid daily  . Left knee DJD   . Nocturia   . Palpitations    started in 2013 and only occasionally;last time noticed about 2-3wks ago and after drinking caffeine  . PONV (postoperative nausea and vomiting)   . 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    Past Surgical History:   Procedure Laterality Date  . COLONOSCOPY    . CT ANGIOGRAM OF CHEST - PE PROTOCOL  07/2018   Bilateral nonocclusive pulmonary emboli evidence of right heart strain consistent with "submassive "/intermediate PE.  -->  Code PE called.  Marland Kitchen DILATION AND CURETTAGE OF UTERUS     multiple about 6-7 times  . ESOPHAGOGASTRODUODENOSCOPY    . NM VQ LUNG SCAN ([place] HX)  11/23/2018   Low probability for PE  . RIGHT/LEFT HEART CATH AND CORONARY ANGIOGRAPHY N/A 08/19/2018   Procedure: RIGHT/LEFT HEART CATH AND CORONARY ANGIOGRAPHY;  Surgeon: Jettie Booze, MD;  Location: [facility];  Service: Cardiovascular:: Nonobstructive CAD.  Mild pulmonary pretension.  Mean PA pressure 26 mmHg with PA P 48/20 mmHg.  Normal LVEDP.  . THYROID SURGERY Right 2010  . TOTAL HIP ARTHROPLASTY Left 2005  . TOTAL KNEE ARTHROPLASTY Left 02/21/2014   Procedure: TOTAL KNEE ARTHROPLASTY;  Surgeon: Lorn Junes, MD;  Location: [facility];  Service: Orthopedics;  Laterality: Left;  . TRANSTHORACIC ECHOCARDIOGRAM  11/2019   Normal EF 60 to 65%.  Moderate LVH-GR one DD.  R WMA.  Mild aortic sclerosis with trivial AI.  Grossly normal pulmonary valve) with no significant dilatation.  Normal RV and RA size.  Normal RV function.  Normal PA pressures.  . TRANSTHORACIC ECHOCARDIOGRAM  07/2018    (In setting of acute PE) mild LVH.  EF 55 to 60%.  GR 1 DD.  Aortic sclerosis but no stenosis.  Mild regurgitation.  Mild RA dilation.  Moderate LA dilation.  Moderate tricuspid regurgitation with severe primary hypertension estimated PA pressure 71 mmHg.  RV poorly visualized  . TRANSTHORACIC ECHOCARDIOGRAM  11/16/2018   EF 60-65%.  GR 1 DD.  Focal basal hypertrophy.  Moderate aortic regurgitation.  No stenosis.  Mild LA dilation.  Normal RV size and function.  Moderate PI but mild TR.  Peak PA pressure is now estimated 38 mmHg.  Marland Kitchen VAGINAL HYSTERECTOMY  1979    There were no vitals filed for this visit.   Subjective Assessment -  01/03/21 1023    Subjective I woke up this AM and i just hurt all over, mostly my back and neck. Nothing "is working for me this morning."    Pertinent History Lt THR 2005, Lt TKR 2015, spinal stenosis, scoliosis, DM, HLD, HTN, Hx of PE    Currently in Pain? Yes   >8/10 neck and back very sore                            [facility] Adult PT Treatment/Exercise - 01/03/21 0001      Neck Exercises: Seated   Cervical Rotation Both;10 reps    Shoulder Rolls 10 reps      Lumbar Exercises: Stretches   Other Lumbar Stretch Exercise Seated forward flexion stretch with blue ball 10x      Lumbar Exercises: Aerobic   Nustep L1 x 10 min with PTA present added MHP to back for relaxation/comfort/anxiety.      Knee/Hip Exercises: Seated   Long Arc Quad Strengthening;1 set;Both;15 reps;Weights    Long Arc Quad Weight 3 lbs.    Clamshell with TheraBand Yellow   2x10     Moist Heat Therapy   Number Minutes Moist Heat 10 Minutes    Moist Heat Location Cervical      Electrical Stimulation   Electrical Stimulation Location Cervical    Electrical Stimulation Action IFC 80-150 HZ    Electrical Stimulation Parameters Seated    Electrical Stimulation Goals Pain                    PT Short Term Goals - 01/01/21 0939      PT SHORT TERM GOAL #1   Title Pt will be ind with initial HEP    Status Achieved      PT SHORT TERM GOAL #2   Title Pt will be able to participate in 3 min walk test with short breaks as needed    Baseline 3:30 walk test x 260' no breaks 01/01/21    Status Achieved      PT SHORT TERM GOAL #3   Title Pt will demo 5x sit to stand using UEs and AD as needed in </= 28 sec    Baseline 22 sec 01/01/21    Status Achieved      PT SHORT TERM GOAL #4   Title Pt will reduce TUG time to </= 28 sec    Baseline 21 sec 01/01/21    Status Achieved             PT Long Term Goals - 01/01/21 6767  PT LONG TERM GOAL #3   Title Pt will demo reduced fall risk by  reducing both TUG time and 5x sit to stand with no AD or least restrictive AD to </= 24 sec.    Baseline 22 sec for 5x sit to stand, 21 sec for TUG    Status Achieved                 Plan - 01/03/21 1022    Clinical Impression Statement Pt arrives feeling stiff and painful in her neck and back. She reports waking up like this. We proceeded slowly today with emphasis on gentle stretching and used the IFC estim on her cervical spine towards the end of todays session for her pain. Pt reports back pain improved with the stretching and the Estim helped her cervical pain. Held ambulation today secondary to her pain.    Personal Factors and Comorbidities Age;Comorbidity 1;Comorbidity 2;Comorbidity 3+    Comorbidities spinal stenosis, Hx of THR/TKR on Lt, Hx of bil PE, cardiovascular deconditioning    Examination-Activity Limitations Locomotion Level;Transfers;Bed Mobility;Bend;Squat;Dressing;Stairs;Stand    Examination-Participation Restrictions Meal Prep;Community Activity;Driving;Laundry;Shop;Cleaning    Stability/Clinical Decision Making Evolving/Moderate complexity    Rehab Potential Good    PT Frequency 2x / week    PT Duration 12 weeks    PT Treatment/Interventions ADLs/Self Care Home Management;Aquatic Therapy;Electrical Stimulation;Moist Heat;DME Instruction;Functional mobility training;Gait training;Therapeutic activities;Therapeutic exercise;Balance training;Neuromuscular re-education;Manual techniques;Patient/family education;Passive range of motion;Joint Manipulations;Spinal Manipulations;Taping    PT Next Visit Plan nustep, gait, continue with postural strength and endurance, f/u on seated deep slow breathing for lung expansion, manual to base of neck as needed for improved pain with neck retraction    PT Home Exercise Plan Access Code: [email or web address]    Consulted and Agree with Plan of Care Patient           Patient will benefit from skilled therapeutic intervention in order to  improve the following deficits and impairments:  Abnormal gait,Decreased range of motion,Difficulty walking,Cardiopulmonary status limiting activity,Decreased endurance,Pain,Decreased activity tolerance,Decreased balance,Impaired flexibility,Hypomobility,Improper body mechanics,Postural dysfunction,Decreased strength,Decreased mobility  Visit Diagnosis: Muscle weakness (generalized)  Chronic midline low back pain without sciatica  Abnormal posture  Cervicalgia  Other abnormalities of gait and mobility     Problem List Patient Active Problem List   Diagnosis Date Noted  . Obstructive sleep apnea 03/09/2019  . Aortic regurgitation 12/02/2018  . Pulmonary embolism ([place]) 08/20/2018  . Elevated troponin 08/20/2018  . DOE (dyspnea on exertion)   . 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  . Bilateral ovarian cysts   . Hx of gallstones   . Hernia, hiatal   . Renal cysts, acquired, bilateral   . GERD (gastroesophageal reflux disease)   . Diverticulosis   . Lumbar spinal stenosis   . Diabetes mellitus without complication ([place])   . Left knee DJD   . Palpitations 12/06/2013  . Morbid obesity ([place]) 12/06/2013  . Fatigue 12/06/2013  . PVC (premature ventricular contraction) 12/06/2013  . Essential hypertension   . Hypothyroidism   . Hyperlipidemia LDL goal <100     Shonta Bourque,PTA 01/03/2021, 12:24 PM  [facility] [address] [place], [place], [address] Phone: [phone]   Fax:  [phone]  Name: Zyonna Vardaman MRN: [id number] Date of Birth: Jun 29, 1946

## 2021-01-08 ENCOUNTER — Encounter: Payer: Self-pay | Admitting: Physical Therapy

## 2021-01-08 ENCOUNTER — Other Ambulatory Visit: Payer: Self-pay

## 2021-01-08 ENCOUNTER — Ambulatory Visit: Payer: Medicare HMO | Admitting: Physical Therapy

## 2021-01-08 DIAGNOSIS — R2689 Other abnormalities of gait and mobility: Secondary | ICD-10-CM

## 2021-01-08 DIAGNOSIS — M542 Cervicalgia: Secondary | ICD-10-CM

## 2021-01-08 DIAGNOSIS — M6281 Muscle weakness (generalized): Secondary | ICD-10-CM

## 2021-01-08 DIAGNOSIS — G8929 Other chronic pain: Secondary | ICD-10-CM

## 2021-01-08 DIAGNOSIS — R293 Abnormal posture: Secondary | ICD-10-CM

## 2021-01-08 NOTE — Therapy (Signed)
[facility] [address], [place], [place], [address] Phone: [phone]   Fax:  [phone]  Physical Therapy Treatment  Patient Details  Name: Tricia Harrison MRN: [id number] Date of Birth: 1946/03/19 Referring Provider (PT): Lujean Amel, MD   Encounter Date: 01/08/2021   PT End of Session - 01/08/21 0930    Visit Number 7    Date for PT Re-Evaluation 02/26/21    Authorization Type [employer]na Medicare    Progress Note Due on Visit 10    PT Start Time 0930    PT Stop Time 1015    PT Time Calculation (min) 45 min    Activity Tolerance Patient tolerated treatment well;Patient limited by pain    Behavior During Therapy [facility] for tasks assessed/performed           Past Medical History:  Diagnosis Date  . Anemia    as a child  . Asthma    as a child  . Chronic back pain    spinal stenosis and buldging disc;scoliosis  . Chronic constipation    occasionally takes something OTC  . Diabetes mellitus without complication ([place])    per pt "Pre" for 20+yrs  . Diverticulosis   . Gallstones    has known about this for 3-52yrs  . GERD (gastroesophageal reflux disease)    takes Omeprazole daily  . H/O hiatal hernia   . Hemorrhoids   . History of blood transfusion    no abnormal reaction noted  . History of bronchitis    states its been a long time ago  . History of gout   . HLD (hyperlipidemia)    takes Fenofibrate daily  . HTN (hypertension)    takes Diltiazem and Ramipril daily  . Hypothyroidism    takes Synthroid daily  . Left knee DJD   . Nocturia   . Palpitations    started in 2013 and only occasionally;last time noticed about 2-3wks ago and after drinking caffeine  . PONV (postoperative nausea and vomiting)   . 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    Past Surgical History:   Procedure Laterality Date  . COLONOSCOPY    . CT ANGIOGRAM OF CHEST - PE PROTOCOL  07/2018   Bilateral nonocclusive pulmonary emboli evidence of right heart strain consistent with "submassive "/intermediate PE.  -->  Code PE called.  Marland Kitchen DILATION AND CURETTAGE OF UTERUS     multiple about 6-7 times  . ESOPHAGOGASTRODUODENOSCOPY    . NM VQ LUNG SCAN ([place] HX)  11/23/2018   Low probability for PE  . RIGHT/LEFT HEART CATH AND CORONARY ANGIOGRAPHY N/A 08/19/2018   Procedure: RIGHT/LEFT HEART CATH AND CORONARY ANGIOGRAPHY;  Surgeon: Jettie Booze, MD;  Location: [facility];  Service: Cardiovascular:: Nonobstructive CAD.  Mild pulmonary pretension.  Mean PA pressure 26 mmHg with PA P 48/20 mmHg.  Normal LVEDP.  . THYROID SURGERY Right 2010  . TOTAL HIP ARTHROPLASTY Left 2005  . TOTAL KNEE ARTHROPLASTY Left 02/21/2014   Procedure: TOTAL KNEE ARTHROPLASTY;  Surgeon: Lorn Junes, MD;  Location: [facility];  Service: Orthopedics;  Laterality: Left;  . TRANSTHORACIC ECHOCARDIOGRAM  11/2019   Normal EF 60 to 65%.  Moderate LVH-GR one DD.  R WMA.  Mild aortic sclerosis with trivial AI.  Grossly normal pulmonary valve) with no significant dilatation.  Normal RV and RA size.  Normal RV function.  Normal PA pressures.  . TRANSTHORACIC ECHOCARDIOGRAM  07/2018    (In setting of acute PE) mild LVH.  EF 55 to 60%.  GR 1 DD.  Aortic sclerosis but no stenosis.  Mild regurgitation.  Mild RA dilation.  Moderate LA dilation.  Moderate tricuspid regurgitation with severe primary hypertension estimated PA pressure 71 mmHg.  RV poorly visualized  . TRANSTHORACIC ECHOCARDIOGRAM  11/16/2018   EF 60-65%.  GR 1 DD.  Focal basal hypertrophy.  Moderate aortic regurgitation.  No stenosis.  Mild LA dilation.  Normal RV size and function.  Moderate PI but mild TR.  Peak PA pressure is now estimated 38 mmHg.  Marland Kitchen VAGINAL HYSTERECTOMY  1979    There were no vitals filed for this visit.   Subjective Assessment -  01/08/21 0931    Subjective I have so many things going on.  I need to sit.  My back is bad.  I couldn't walk much over the weekend.  I am shakey too and see the cardiologist 01/23/21.  I fly to [place] with my son middle of the week for my birthday.    Pertinent History Lt THR 2005, Lt TKR 2015, spinal stenosis, scoliosis, DM, HLD, HTN, Hx of PE    Limitations Standing;Walking    How long can you stand comfortably? <5 min    How long can you walk comfortably? household distances, very fatiguing    Patient Stated Goals stand up and walk by myself, no water therapy while it's cold    Currently in Pain? Yes    Pain Score 9     Pain Location Back    Pain Orientation Lower    Pain Descriptors / Indicators Dull;Aching;Sore    Pain Type Chronic pain    Aggravating Factors  walk, stand    Pain Relieving Factors sit    Multiple Pain Sites Yes    Pain Score 8    Pain Location Neck    Pain Orientation Posterior    Pain Descriptors / Indicators Aching;Tightness    Pain Type Chronic pain    Pain Onset More than a month ago    Pain Frequency Constant    Aggravating Factors  bad posture                             [facility] Adult PT Treatment/Exercise - 01/08/21 0001      Ambulation/Gait   Assistive device Rolling walker    Gait Pattern Step-through pattern;Decreased step length - left;Trunk flexed    Gait Comments 3 rounds to fatigue with RW to total >6' ambulation, limited by cardiovascular fatigue vs pain needing short breaks. Ambulation time was 2', 3', 1.5'      Neck Exercises: Seated   Neck Retraction 5 reps;3 secs    Cervical Rotation Both;10 reps    Shoulder Rolls 10 reps;Backwards      Knee/Hip Exercises: Seated   Long Arc Quad Strengthening;Both;2 sets;10 reps;Weights    Long Arc Quad Weight 3 lbs.    Other Seated Knee/Hip Exercises heel/toe raises x 20 bil    Marching Strengthening;Both;20 reps;Weights    Marching Limitations PT VCs for breath control, don't hold  breath    Marching Weights 3 lbs.      Moist Heat Therapy   Number Minutes Moist Heat 10 Minutes    Moist Heat Location Lumbar Spine   concurrent with seated ther  ex                   PT Short Term Goals - 01/01/21 0939      PT SHORT TERM GOAL #1   Title Pt will be ind with initial HEP    Status Achieved      PT SHORT TERM GOAL #2   Title Pt will be able to participate in 3 min walk test with short breaks as needed    Baseline 3:30 walk test x 260' no breaks 01/01/21    Status Achieved      PT SHORT TERM GOAL #3   Title Pt will demo 5x sit to stand using UEs and AD as needed in </= 28 sec    Baseline 22 sec 01/01/21    Status Achieved      PT SHORT TERM GOAL #4   Title Pt will reduce TUG time to </= 28 sec    Baseline 21 sec 01/01/21    Status Achieved             PT Long Term Goals - 01/01/21 0953      PT LONG TERM GOAL #3   Title Pt will demo reduced fall risk by reducing both TUG time and 5x sit to stand with no AD or least restrictive AD to </= 24 sec.    Baseline 22 sec for 5x sit to stand, 21 sec for TUG    Status Achieved                 Plan - 01/08/21 1002    Clinical Impression Statement Pt arrived with high pain rating of 9/10 in lumbar and 8/10 in neck.  She reported feeling shaky on/off over the weekend and started session feeling shaky.  She was determined to practice ambulation and was able to ambulate x 3 bouts of ambulation with RW with improved upright trunk limited by cardiovascular fatigue vs pain each round.  Ambulation was timed and she was able to ambulate x 2', 3', and 1.5' min with brief seated rest, covering a 6' walk test with breaks.  Pt with improved skakiness as session went on.  PT cued upright trunk and deep slow breathing during breaks. Pt has another appointment this week and then travels to [facility] her b[facility] for a few days.  She has a wheelchair transport arranged.    Comorbidities spinal stenosis, Hx of THR/TKR on Lt,  Hx of bil PE, cardiovascular deconditioning    Rehab Potential Good    PT Frequency 2x / week    PT Duration 12 weeks    PT Treatment/Interventions ADLs/Self Care Home Management;Aquatic Therapy;Electrical Stimulation;Moist Heat;DME Instruction;Functional mobility training;Gait training;Therapeutic activities;Therapeutic exercise;Balance training;Neuromuscular re-education;Manual techniques;Patient/family education;Passive range of motion;Joint Manipulations;Spinal Manipulations;Taping    PT Next Visit Plan continue gait endurance with postural awareness, NuStep, breath cueing with ther ex and during breaks with gait, LE and postural strength    PT Home Exercise Plan Access Code: [email or web address]    Consulted and Agree with Plan of Care Patient           Patient will benefit from skilled therapeutic intervention in order to improve the following deficits and impairments:     Visit Diagnosis: Muscle weakness (generalized)  Chronic midline low back pain without sciatica  Abnormal posture  Cervicalgia  Other abnormalities of gait and mobility     Problem List Patient Active Problem List   Diagnosis Date Noted  . Obstructive sleep apnea  03/09/2019  . Aortic regurgitation 12/02/2018  . Pulmonary embolism ([place]) 08/20/2018  . Elevated troponin 08/20/2018  . DOE (dyspnea on exertion)   . 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  . Bilateral ovarian cysts   . Hx of gallstones   . Hernia, hiatal   . Renal cysts, acquired, bilateral   . GERD (gastroesophageal reflux disease)   . Diverticulosis   . Lumbar spinal stenosis   . Diabetes mellitus without complication ([place])   . Left knee DJD   . Palpitations 12/06/2013  . Morbid obesity ([place]) 12/06/2013  . Fatigue 12/06/2013  . PVC (premature ventricular contraction) 12/06/2013  . Essential hypertension   . Hypothyroidism   . Hyperlipidemia LDL goal <100     Tinya Cadogan E Tayvin Preslar 01/08/2021, 10:10 AM  [facility] [address] [place], [place], [address] Phone: [phone]   Fax:  [phone]  Name: Tricia Harrison MRN: [id number] Date of Birth: Oct 10, 1946

## 2021-01-10 ENCOUNTER — Encounter: Payer: Self-pay | Admitting: Physical Therapy

## 2021-01-10 ENCOUNTER — Ambulatory Visit: Payer: Medicare HMO | Admitting: Physical Therapy

## 2021-01-10 ENCOUNTER — Other Ambulatory Visit: Payer: Self-pay

## 2021-01-10 DIAGNOSIS — R293 Abnormal posture: Secondary | ICD-10-CM

## 2021-01-10 DIAGNOSIS — M6281 Muscle weakness (generalized): Secondary | ICD-10-CM

## 2021-01-10 DIAGNOSIS — R2689 Other abnormalities of gait and mobility: Secondary | ICD-10-CM

## 2021-01-10 DIAGNOSIS — M542 Cervicalgia: Secondary | ICD-10-CM

## 2021-01-10 DIAGNOSIS — G8929 Other chronic pain: Secondary | ICD-10-CM

## 2021-01-10 DIAGNOSIS — M545 Low back pain, unspecified: Secondary | ICD-10-CM

## 2021-01-10 NOTE — Therapy (Signed)
[facility] [address], [place], [place], [address] Phone: [phone]   Fax:  [phone]  Physical Therapy Treatment  Patient Details  Name: Tricia Harrison MRN: [id number] Date of Birth: [birth year] Referring Provider (PT): Lujean Amel, MD   Encounter Date: 01/10/2021   PT End of Session - 01/10/21 0941    Visit Number 8    Authorization Type [employer]    Progress Note Due on Visit 10    PT Start Time 0935    PT Stop Time 1020    PT Time Calculation (min) 45 min    Activity Tolerance Patient tolerated treatment well    Behavior During Therapy [facility] for tasks assessed/performed           Past Medical History:  Diagnosis Date  . Anemia    as a child  . Asthma    as a child  . Chronic back pain    spinal stenosis and buldging disc;scoliosis  . Chronic constipation    occasionally takes something OTC  . Diabetes mellitus without complication ([place])    per pt "Pre" for 20+yrs  . Diverticulosis   . Gallstones    has known about this for 3-2yrs  . GERD (gastroesophageal reflux disease)    takes Omeprazole daily  . H/O hiatal hernia   . Hemorrhoids   . History of blood transfusion    no abnormal reaction noted  . History of bronchitis    states its been a long time ago  . History of gout   . HLD (hyperlipidemia)    takes Fenofibrate daily  . HTN (hypertension)    takes Diltiazem and Ramipril daily  . Hypothyroidism    takes Synthroid daily  . Left knee DJD   . Nocturia   . Palpitations    started in 2013 and only occasionally;last time noticed about 2-3wks ago and after drinking caffeine  . PONV (postoperative nausea and vomiting)   . 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    Past Surgical History:  Procedure Laterality Date  . COLONOSCOPY    . CT ANGIOGRAM  OF CHEST - PE PROTOCOL  07/2018   Bilateral nonocclusive pulmonary emboli evidence of right heart strain consistent with "submassive "/intermediate PE.  -->  Code PE called.  Marland Kitchen DILATION AND CURETTAGE OF UTERUS     multiple about 6-7 times  . ESOPHAGOGASTRODUODENOSCOPY    . NM VQ LUNG SCAN ([place] HX)  11/23/2018   Low probability for PE  . RIGHT/LEFT HEART CATH AND CORONARY ANGIOGRAPHY N/A 08/19/2018   Procedure: RIGHT/LEFT HEART CATH AND CORONARY ANGIOGRAPHY;  Surgeon: Jettie Booze, MD;  Location: [facility];  Service: Cardiovascular:: Nonobstructive CAD.  Mild pulmonary pretension.  Mean PA pressure 26 mmHg with PA P 48/20 mmHg.  Normal LVEDP.  . THYROID SURGERY Right 2010  . TOTAL HIP ARTHROPLASTY Left 2005  . TOTAL KNEE ARTHROPLASTY Left 02/21/2014   Procedure: TOTAL KNEE ARTHROPLASTY;  Surgeon: Lorn Junes, MD;  Location: [facility];  Service: Orthopedics;  Laterality: Left;  . TRANSTHORACIC ECHOCARDIOGRAM  11/2019   Normal EF 60 to 65%.  Moderate LVH-GR one DD.  R WMA.  Mild aortic sclerosis with trivial AI.  Grossly normal pulmonary valve) with no significant dilatation.  Normal RV and RA size.  Normal RV function.  Normal PA pressures.  . TRANSTHORACIC ECHOCARDIOGRAM  07/2018    (In setting of acute PE) mild LVH.  EF 55 to 60%.  GR 1 DD.  Aortic sclerosis but no stenosis.  Mild regurgitation.  Mild RA dilation.  Moderate LA dilation.  Moderate tricuspid regurgitation with severe primary hypertension estimated PA pressure 71 mmHg.  RV poorly visualized  . TRANSTHORACIC ECHOCARDIOGRAM  11/16/2018   EF 60-65%.  GR 1 DD.  Focal basal hypertrophy.  Moderate aortic regurgitation.  No stenosis.  Mild LA dilation.  Normal RV size and function.  Moderate PI but mild TR.  Peak PA pressure is now estimated 38 mmHg.  Marland Kitchen VAGINAL HYSTERECTOMY  1979    There were no vitals filed for this visit.   Subjective Assessment - 01/10/21 0942    Subjective Overall better, I was working in my  closet yesterday so my upper back is sore.    Pertinent History Lt THR 2005, Lt TKR 2015, spinal stenosis, scoliosis, DM, HLD, HTN, Hx of PE    Currently in Pain? Yes    Pain Score 7     Pain Location Back    Pain Orientation Upper    Pain Descriptors / Indicators Sore    Aggravating Factors  walking, syanding    Pain Relieving Factors sitting    Multiple Pain Sites No                             [facility] Adult PT Treatment/Exercise - 01/10/21 0001      Lumbar Exercises: Aerobic   Nustep L3 x 10 min to end session      Knee/Hip Exercises: Seated   Long Arc Quad Both;2 sets;10 reps;Weights    Long Arc Quad Weight 4 lbs.    Marching Strengthening;Both;20 reps;Weights    Marching Weights 4 lbs.      Shoulder Exercises: Seated   Horizontal ABduction AROM;Strengthening;Both;5 reps;Theraband    Theraband Level (Shoulder Horizontal ABduction) Level 1 (Yellow)      Shoulder Exercises: Isometric Strengthening   Other Isometric Exercises all 4 directions 5x 5 sec hold: given for HEP                  PT Education - 01/10/21 1010    Education Details HEP    Person(s) Educated Patient    Methods Explanation;Tactile cues;Demonstration;Verbal cues;Handout    Comprehension Returned demonstration;Verbalized understanding            PT Short Term Goals - 01/01/21 0939      PT SHORT TERM GOAL #1   Title Pt will be ind with initial HEP    Status Achieved      PT SHORT TERM GOAL #2   Title Pt will be able to participate in 3 min walk test with short breaks as needed    Baseline 3:30 walk test x 260' no breaks 01/01/21    Status Achieved      PT SHORT TERM GOAL #3   Title Pt will demo 5x sit to stand using UEs and AD as needed in </= 28 sec    Baseline 22 sec 01/01/21    Status Achieved      PT SHORT TERM GOAL #4   Title Pt will reduce TUG time to </= 28 sec    Baseline 21 sec 01/01/21    Status Achieved             PT Long Term  Goals - 01/01/21 0953       PT LONG TERM GOAL #3   Title Pt will demo reduced fall risk by reducing both TUG time and 5x sit to stand with no AD or least restrictive AD to </= 24 sec.    Baseline 22 sec for 5x sit to stand, 21 sec for TUG    Status Achieved                 Plan - 01/10/21 0957    Clinical Impression Statement Pt arrives with reports of "feeling better" but her upper back muscles are sore from "messing around in the closet yesterday." Pt was given some shoulder isometrics to give some muscle activation to the upper back and scapular muscles and help assist with better posture. Pt will also do these and some of her neck ROm exercises when she is on the plane tomorrow.    Personal Factors and Comorbidities Age;Comorbidity 1;Comorbidity 2;Comorbidity 3+    Comorbidities spinal stenosis, Hx of THR/TKR on Lt, Hx of bil PE, cardiovascular deconditioning    Examination-Activity Limitations Locomotion Level;Transfers;Bed Mobility;Bend;Squat;Dressing;Stairs;Stand    Examination-Participation Restrictions Meal Prep;Community Activity;Driving;Laundry;Shop;Cleaning    Stability/Clinical Decision Making Evolving/Moderate complexity    Rehab Potential Good    PT Frequency 2x / week    PT Duration 12 weeks    PT Treatment/Interventions ADLs/Self Care Home Management;Aquatic Therapy;Electrical Stimulation;Moist Heat;DME Instruction;Functional mobility training;Gait training;Therapeutic activities;Therapeutic exercise;Balance training;Neuromuscular re-education;Manual techniques;Patient/family education;Passive range of motion;Joint Manipulations;Spinal Manipulations;Taping    PT Next Visit Plan continue gait endurance with postural awareness, NuStep, breath cueing with ther ex and during breaks with gait, LE and postural strength    PT Home Exercise Plan Access Code: [email or web address]    Consulted and Agree with Plan of Care Patient           Patient will benefit from skilled therapeutic intervention in order to  improve the following deficits and impairments:  Abnormal gait,Decreased range of motion,Difficulty walking,Cardiopulmonary status limiting activity,Decreased endurance,Pain,Decreased activity tolerance,Decreased balance,Impaired flexibility,Hypomobility,Improper body mechanics,Postural dysfunction,Decreased strength,Decreased mobility  Visit Diagnosis: Muscle weakness (generalized)  Chronic midline low back pain without sciatica  Abnormal posture  Cervicalgia  Other abnormalities of gait and mobility     Problem List Patient Active Problem List   Diagnosis Date Noted  . Obstructive sleep apnea 03/09/2019  . Aortic regurgitation 12/02/2018  . Pulmonary embolism ([place]) 08/20/2018  . Elevated troponin 08/20/2018  . DOE (dyspnea on exertion)   . 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  . Bilateral ovarian cysts   . Hx of gallstones   . Hernia, hiatal   . Renal cysts, acquired, bilateral   . GERD (gastroesophageal reflux disease)   . Diverticulosis   . Lumbar spinal stenosis   . Diabetes mellitus without complication ([place])   . Left knee DJD   . Palpitations 12/06/2013  . Morbid obesity ([place]) 12/06/2013  . Fatigue 12/06/2013  . PVC (premature ventricular contraction) 12/06/2013  . Essential hypertension   . Hypothyroidism   . Hyperlipidemia LDL goal <100     Elia Nunley, PTA 01/10/2021, 10:11 AM   [facility] [address] [place], [place], [address] Phone: [phone]   Fax:  [phone]  Name: Daleysa Kristiansen MRN: [id number] Date of Birth: September 16, 1946  Access Code: [email or web address]: 02/16/2022Prepared by: Anderson Malta CochranExercises  Seated Cervical Rotation AROM - 3 x daily - 7 x weekly - 1 sets - 10 reps  Seated Cervical Sidebending AROM - 3 x  daily - 7 x weekly - 1 sets - 10 reps  Seated Cervical Retraction - 3 x daily - 7 x weekly - 1 sets - 10  reps - 5 hold  Seated Backward Shoulder Rolls - 3 x daily - 7 x weekly - 1 sets - 10 reps  Seated Scapular Retraction - 3 x daily - 7 x weekly - 1 sets - 10 reps  Seated March - 3 x daily - 7 x weekly - 1 sets - 20 reps  Seated Long Arc Quad - 3 x daily - 7 x weekly - 2 sets - 10 reps  Sit to Stand with Counter Support - 3 x daily - 7 x weekly - 1 sets - 5 reps  Side to Side Weight Shift with Counter Support - 3 x daily - 7 x weekly - 1 sets - 15 reps  Staggered Stance Forward Backward Weight Shift with Counter Support - 3 x daily - 7 x weekly - 1 sets - 15 reps  Seated Shoulder Row with Anchored Resistance - 1 x daily - 7 x weekly - 2 sets - 10 reps  Seated Shoulder Extension and Scapular Retraction with Resistance - 1 x daily - 7 x weekly - 2 sets - 10 reps  Seated Passive Knee Extension - 1 x daily - 7 x weekly - 1 sets - 1 reps - 2 min hold  Seated Scapular Retraction - 1 x daily - 7 x weekly - 1 sets - 10 reps - 5 hold  Isometric Shoulder Internal Rotation - 1 x daily - 7 x weekly - 1 sets - 10 reps - 5 hold  Isometric Shoulder External Rotation - 1 x daily - 7 x weekly - 1 sets - 10 reps - 5 hold  Isometric Shoulder Flexion - 1 x daily - 7 x weekly - 1 sets - 10 reps - 5 hold

## 2021-01-15 ENCOUNTER — Encounter: Payer: Medicare HMO | Admitting: Physical Therapy

## 2021-01-17 ENCOUNTER — Ambulatory Visit: Payer: Medicare HMO | Admitting: Physical Therapy

## 2021-01-22 ENCOUNTER — Encounter: Payer: Medicare HMO | Admitting: Physical Therapy

## 2021-01-23 ENCOUNTER — Ambulatory Visit (INDEPENDENT_AMBULATORY_CARE_PROVIDER_SITE_OTHER): Payer: Medicare HMO

## 2021-01-23 ENCOUNTER — Ambulatory Visit (INDEPENDENT_AMBULATORY_CARE_PROVIDER_SITE_OTHER): Payer: Medicare HMO | Admitting: Cardiology

## 2021-01-23 ENCOUNTER — Other Ambulatory Visit: Payer: Self-pay

## 2021-01-23 ENCOUNTER — Encounter: Payer: Self-pay | Admitting: Cardiology

## 2021-01-23 ENCOUNTER — Encounter: Payer: Self-pay | Admitting: *Deleted

## 2021-01-23 VITALS — BP 130/80 | HR 73 | Ht 61.0 in | Wt 183.8 lb

## 2021-01-23 DIAGNOSIS — E785 Hyperlipidemia, unspecified: Secondary | ICD-10-CM

## 2021-01-23 DIAGNOSIS — R002 Palpitations: Secondary | ICD-10-CM | POA: Diagnosis not present

## 2021-01-23 DIAGNOSIS — Z86711 Personal history of pulmonary embolism: Secondary | ICD-10-CM | POA: Diagnosis not present

## 2021-01-23 DIAGNOSIS — G4733 Obstructive sleep apnea (adult) (pediatric): Secondary | ICD-10-CM

## 2021-01-23 DIAGNOSIS — I493 Ventricular premature depolarization: Secondary | ICD-10-CM

## 2021-01-23 DIAGNOSIS — I351 Nonrheumatic aortic (valve) insufficiency: Secondary | ICD-10-CM

## 2021-01-23 DIAGNOSIS — I10 Essential (primary) hypertension: Secondary | ICD-10-CM

## 2021-01-23 DIAGNOSIS — R5383 Other fatigue: Secondary | ICD-10-CM

## 2021-01-23 DIAGNOSIS — R0600 Dyspnea, unspecified: Secondary | ICD-10-CM | POA: Diagnosis not present

## 2021-01-23 DIAGNOSIS — R0609 Other forms of dyspnea: Secondary | ICD-10-CM

## 2021-01-23 HISTORY — PX: TRANSTHORACIC ECHOCARDIOGRAM: SHX275

## 2021-01-23 NOTE — Patient Instructions (Addendum)
Medication Instructions:  No changes  *If you need a refill on your cardiac medications before your next appointment, please call your pharmacy*   Lab Work:  Not needed   Testing/Procedures: Will be schedule at [facility] has requested that you have an echocardiogram. Echocardiography is a painless test that uses sound waves to create images of your heart. It provides your doctor with information about the size and shape of your heart and how well your heart's chambers and valves are working. This procedure takes approximately one hour. There are no restrictions for this procedure.   Monitor will be mailed to you- Your physician has recommended that you wear a 3 day Zio holter monitor. Holter monitors are medical devices that record the heart's electrical activity. Doctors most often use these monitors to diagnose arrhythmias. Arrhythmias are problems with the speed or rhythm of the heartbeat. The monitor is a small, portable device. You can wear one while you do your normal daily activities. This is usually used to diagnose what is causing palpitations/syncope (passing out).  Follow-Up: At [facility], you and your health needs are our priority.  As part of our continuing mission to provide you with exceptional heart care, we have created designated Provider Care Teams.  These Care Teams include your primary Cardiologist (physician) and Advanced Practice Providers (APPs -  Physician Assistants and Nurse Practitioners) who all work together to provide you with the care you need, when you need it.     Your next appointment:   1 month(s)  The format for your next appointment:   in person or virtual   Provider:   Glenetta Hew, MD   Other Instructions Recommend you use your C-Pap at bedtime and anytime you take a nap   ZIO XT- Long Term Monitor Instructions   Your physician has requested you wear your ZIO patch monitor____3___days.   This  is a single patch monitor.  [employer] supplies one patch monitor per enrollment.  Additional stickers are not available.   Please do not apply patch if you will be having a Nuclear Stress Test, Echocardiogram, Cardiac CT, MRI, or Chest Xray during the time frame you would be wearing the monitor. The patch cannot be worn during these tests.  You cannot remove and re-apply the ZIO XT patch monitor.   Your ZIO patch monitor will be sent USPS Priority mail from [facility] directly to your home address. The monitor may also be mailed to a PO BOX if home delivery is not available.   It may take 3-5 days to receive your monitor after you have been enrolled.   Once you have received you monitor, please review enclosed instructions.  Your monitor has already been registered assigning a specific monitor serial # to you.   Applying the monitor   Shave hair from upper left chest.   Hold abrader disc by orange tab.  Rub abrader in 40 strokes over left upper chest as indicated in your monitor instructions.   Clean area with 4 enclosed alcohol pads .  Use all pads to assure are is cleaned thoroughly.  Let dry.   Apply patch as indicated in monitor instructions.  Patch will be place under collarbone on left side of chest with arrow pointing upward.   Rub patch adhesive wings for 2 minutes.Remove white label marked "1".  Remove white label marked "2".  Rub patch adhesive wings for 2 additional minutes.   While looking in a mirror,  press and release button in center of patch.  A small green light will flash 3-4 times .  This will be your only indicator the monitor has been turned on.     Do not shower for the first 24 hours.  You may shower after the first 24 hours.   Press button if you feel a symptom. You will hear a small click.  Record Date, Time and Symptom in the Patient Log Book.   When you are ready to remove patch, follow instructions on last 2 pages of Patient Log Book.  Stick patch monitor  onto last page of Patient Log Book.   Place Patient Log Book in [facility] box.  Use locking tab on box and tape box closed securely.  The [employer] and [employer] has [employer] on it.  Please place in mailbox as soon as possible.  Your physician should have your test results approximately 7 days after the monitor has been mailed back to [facility]   Call [facility] at [phone] if you have questions regarding your ZIO XT patch monitor.  Call them immediately if you see an orange light blinking on your monitor.   If your monitor falls off in less than 4 days contact our Monitor department at [phone].  If your monitor becomes loose or falls off after 4 days call [employer] at [phone] for suggestions on securing your monitor.

## 2021-01-23 NOTE — Progress Notes (Signed)
Patient ID: Tricia Harrison, female   DOB: 1946/09/11, 75 y.o.   MRN: [id number] Patient enrolled for [employer] to ship a 3 day ZIO XT long term holter monitor to her home.

## 2021-01-23 NOTE — Progress Notes (Signed)
Primary Care Provider: Lujean Amel, MD Cardiologist: Glenetta Hew, MD Electrophysiologist: None  Clinic Note: Chief Complaint  Patient presents with  . Follow-up  . Shortness of Breath    Short of breath doing this by anything.  Also noted orthopnea.  . Palpitations    Intermittent rapid/irregular heart rate spells   ===================================  ASSESSMENT/PLAN   Problem List Items Addressed This Visit    Fatigue   DOE (dyspnea on exertion) - Primary    I suspect that the negative reason for dizziness related to deconditioning, but she is more active now, ambulating or dyspnea.  Recheck 2D echocardiogram for valvular disease and reassess EF  Low threshold to consider ischemic evaluation      Relevant Orders   ECHOCARDIOGRAM COMPLETE   History of pulmonary embolism (Chronic)    Remains on Eliquis-essentially lifelong.  We will check a 2D echo to reassess EF and RV function/pressures.  Would be okay to hold Eliquis for procedures (2 days from our procedures, 3 days per neurology procedures-spinal or brain).      Relevant Orders   EKG 12-Lead (Completed)   ECHOCARDIOGRAM COMPLETE   Essential hypertension (Chronic)    Blood pressure looks pretty well controlled today on current dose of carvedilol, diltiazem and ramipril..  No suggestion of bradycardia.      Relevant Medications   ramipril (ALTACE) 2.5 MG capsule   Other Relevant Orders   EKG 12-Lead (Completed)   Hyperlipidemia LDL goal <100 (Chronic)    Thankfully, her LDL is now 81.  She is taking Crestor 20 mg daily and tolerating it relatively well along with fenofibrate.  She restarted statin because of myalgia/fatigue symptoms did not change with statin holiday..  Stable labs for now.  Continue  Closely monitor with the combination of statin plus fibrate.      Relevant Medications   ramipril (ALTACE) 2.5 MG capsule   Rapid palpitations (Chronic)    Previously well controlled on  beta-blocker, now she is having symptoms again.  Will reassess with 3-day monitor.      Relevant Orders   EKG 12-Lead (Completed)   LONG TERM MONITOR (3-14 DAYS)   Aortic regurgitation (Chronic)    Previously noted as mild AI with aortic sclerosis.  We are rechecking 2D echocardiogram because of exertional dyspnea and palpitations.  We will reassess aortic valve disease.      Relevant Medications   ramipril (ALTACE) 2.5 MG capsule   PVC (premature ventricular contraction) (Chronic)    She has had PVCs in the past.  Now feeling irregular heartbeats and rapid heartbeats.  Need to exclude true arrhythmias or significant PVC burden.  Especially in light of her having worsening dyspnea.  Plan:  3-day Zio patch monitor  2D echo  Continue beta-blocker and calcium channel blocker at current doses for now.      Relevant Medications   ramipril (ALTACE) 2.5 MG capsule   Other Relevant Orders   ECHOCARDIOGRAM COMPLETE   Obstructive sleep apnea (Chronic)     ===================================  HPI:    Tricia Harrison is a 75 y.o. female with a PMH notable for history of Submassive PE, and Lower Extremity Edema who presents today for delayed annual follow-up.  Tricia Harrison was last seen on December 09, 2019-follow-up.  Using a rolling walker. Tried exercising stationary bicycle.  Has lost 21 pounds since November 2019 having adjusted her diet.  She stopped Crestor because of myalgias.  Cholesterol levels increased.  Recent Hospitalizations: None  Reviewed  CV studies:    The following studies were reviewed today: (if available, images/films reviewed: From Epic Chart or Care Everywhere) . none:  Interval History:   Tricia Harrison returns here today for delayed annual follow-up complaining about issues of worsening dyspnea.  She feels more tired and fatigued than before.  With any type of activity, her stamina is Tikosyn her heart rate goes up.  She is  extremely short of breath she says that she is doing PT and her leg strength is getting better.  I am not sure how much difference she noted from stopping her statin, we did not her left hip is really bothering her at this point that seems to be focus.  She is losing weight with dietary adjustment and try to do some exercise.  She is not using CPAP stating that she cannot tolerate it.  She is feeling several episodes of increased heart rate that occurs with and without activity.  She describes as a fluttering sensation, she gets short of breath with it and a little dizzy but no syncope or near syncope.  CV Review of Symptoms (Summary): positive for - dyspnea on exertion, irregular heartbeat, orthopnea, palpitations, rapid heart rate and shortness of breath negative for - chest pain, edema, paroxysmal nocturnal dyspnea or Syncope or near syncope TIA/amaurosis fugax, claudication  The patient does not have symptoms concerning for COVID-19 infection (fever, chills, cough, or new shortness of breath).   REVIEWED OF SYSTEMS   Review of Systems  Constitutional: Positive for malaise/fatigue (Still does not have the energy to do things.  It hurts to do things.) and weight loss (Trying to exercise, eating better.).  HENT: Negative for congestion and nosebleeds.   Respiratory: Positive for shortness of breath (Worsening dyspnea with exertion, orthopnea etc.). Negative for cough.   Cardiovascular: Positive for orthopnea.  Gastrointestinal: Positive for constipation (Chronic). Negative for blood in stool and melena.  Genitourinary: Negative for hematuria.  Musculoskeletal: Positive for back pain, joint pain, myalgias and neck pain. Negative for falls.  Neurological: Positive for dizziness and weakness (Bilateral leg weakness).  Psychiatric/Behavioral: Positive for depression. Negative for memory loss. The patient is not nervous/anxious and does not have insomnia.    I have reviewed and (if needed)  personally updated the patient's problem list, medications, allergies, past medical and surgical history, social and family history.   PAST MEDICAL HISTORY   Past Medical History:  Diagnosis Date  . Anemia    as a child  . Asthma    as a child  . Chronic back pain    spinal stenosis and buldging disc;scoliosis  . Chronic constipation    occasionally takes something OTC  . Diabetes mellitus without complication ([place])    per pt "Pre" for 20+yrs  . Diverticulosis   . Gallstones    has known about this for 3-27yrs  . GERD (gastroesophageal reflux disease)    takes Omeprazole daily  . H/O hiatal hernia   . Hemorrhoids   . History of blood transfusion    no abnormal reaction noted  . History of bronchitis    states its been a long time ago  . History of gout   . HLD (hyperlipidemia)    takes Fenofibrate daily  . HTN (hypertension)    takes Diltiazem and Ramipril daily  . Hypothyroidism    takes Synthroid daily  . Left knee DJD   . Nocturia   . Palpitations    started in 2013 and only  occasionally;last time noticed about 2-3wks ago and after drinking caffeine  . PONV (postoperative nausea and vomiting)   . 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    PAST SURGICAL HISTORY   Past Surgical History:  Procedure Laterality Date  . COLONOSCOPY    . CT ANGIOGRAM OF CHEST - PE PROTOCOL  07/2018   Bilateral nonocclusive pulmonary emboli evidence of right heart strain consistent with "submassive "/intermediate PE.  -->  Code PE called.  Marland Kitchen DILATION AND CURETTAGE OF UTERUS     multiple about 6-7 times  . ESOPHAGOGASTRODUODENOSCOPY    . NM VQ LUNG SCAN ([facility])  11/23/2018   Low probability for PE  . RIGHT/LEFT HEART CATH AND CORONARY ANGIOGRAPHY N/A 08/19/2018   Procedure: RIGHT/LEFT HEART CATH AND CORONARY ANGIOGRAPHY;  Surgeon: Jettie Booze, MD;   Location: [facility];  Service: Cardiovascular:: Nonobstructive CAD.  Mild pulmonary pretension.  Mean PA pressure 26 mmHg with PA P 48/20 mmHg.  Normal LVEDP.  . THYROID SURGERY Right 2010  . TOTAL HIP ARTHROPLASTY Left 2005  . TOTAL KNEE ARTHROPLASTY Left 02/21/2014   Procedure: TOTAL KNEE ARTHROPLASTY;  Surgeon: Lorn Junes, MD;  Location: [facility];  Service: Orthopedics;  Laterality: Left;  . TRANSTHORACIC ECHOCARDIOGRAM  11/2019   Normal EF 60 to 65%.  Moderate LVH-GR one DD.  R WMA.  Mild aortic sclerosis with trivial AI.  Grossly normal pulmonary valve) with no significant dilatation.  Normal RV and RA size.  Normal RV function.  Normal PA pressures.  . TRANSTHORACIC ECHOCARDIOGRAM  07/2018    (In setting of acute PE) mild LVH.  EF 55 to 60%.  GR 1 DD.  Aortic sclerosis but no stenosis.  Mild regurgitation.  Mild RA dilation.  Moderate LA dilation.  Moderate tricuspid regurgitation with severe primary hypertension estimated PA pressure 71 mmHg.  RV poorly visualized  . TRANSTHORACIC ECHOCARDIOGRAM  11/16/2018   EF 60-65%.  GR 1 DD.  Focal basal hypertrophy.  Moderate aortic regurgitation.  No stenosis.  Mild LA dilation.  Normal RV size and function.  Moderate PI but mild TR.  Peak PA pressure is now estimated 38 mmHg.  Marland Kitchen VAGINAL HYSTERECTOMY  1979    Immunization History  Administered Date(s) Administered  . Influenza, High Dose Seasonal PF 08/20/2018    MEDICATIONS/ALLERGIES   Current Meds  Medication Sig  . acetaminophen (TYLENOL) 500 MG tablet Take 1,000 mg by mouth every 6 (six) hours as needed for mild pain.  Marland Kitchen albuterol (VENTOLIN HFA) 108 (90 Base) MCG/ACT inhaler Inhale 2 puffs into the lungs every 6 (six) hours as needed.  Marland Kitchen azaTHIOprine (IMURAN) 50 MG tablet Take 100 mg by mouth daily.  . carvedilol (COREG) 6.25 MG tablet TAKE 1 TABLET BY MOUTH TWO  TIMES DAILY WITH A MEAL  . Cholecalciferol (VITAMIN D3) 2000 UNITS TABS Take 2,000 Units by mouth daily.  Marland Kitchen  diltiazem (CARDIZEM LA) 360 MG 24 hr tablet Take 360 mg by mouth daily.  . dorzolamide-timolol (COSOPT) 22.3-6.8 MG/ML ophthalmic solution Place 1 drop into both eyes 2 times daily.  Marland Kitchen ELIQUIS 5 MG TABS tablet TAKE 1 TABLET BY MOUTH  TWICE DAILY  . fenofibrate (TRICOR) 145 MG tablet Take 145 mg by mouth daily.  Marland Kitchen HUMIRA PEN 40 MG/0.4ML PNKT SMARTSIG:40 Milligram(s) SUB-Q Every 2 Weeks  . ketorolac (ACULAR) 0.4 %  SOLN Apply 1 drop to eye 4 (four) times daily.  Marland Kitchen levothyroxine (SYNTHROID, LEVOTHROID) 50 MCG tablet Take 50 mcg by mouth daily before breakfast.  . omeprazole (PRILOSEC) 20 MG capsule Take 20 mg by mouth daily.  . polyethylene glycol (MIRALAX / GLYCOLAX) 17 g packet Take 17 g by mouth daily as needed for mild constipation.  . ramipril (ALTACE) 2.5 MG capsule Take 2.5 mg by mouth daily.  . rosuvastatin (CRESTOR) 20 MG tablet TAKE 1 TABLET BY MOUTH  DAILY AT 6 PM.    Allergies  Allergen Reactions  . Atorvastatin Other (See Comments)    Muscle pain  . Codeine Nausea And Vomiting  . Morphine And Related     B/p drops     SOCIAL HISTORY/FAMILY HISTORY   Reviewed in Epic:  Pertinent findings:  Social History   Tobacco Use  . Smoking status: Former Smoker    Packs/day: 0.50    Years: 20.00    Pack years: 10.00    Types: Cigarettes    Start date: 09/04/1964    Quit date: 11/25/1993    Years since quitting: 27.1  . Smokeless tobacco: Never Used  . Tobacco comment: quit smoking in 1995  Substance Use Topics  . Alcohol use: Yes    Comment: Socially.  . Drug use: No   Social History   Social History Narrative   Pt lives w/ sister and nephew.    OBJCTIVE -PE, EKG, labs   Wt Readings from Last 3 Encounters:  01/23/21 183 lb 12.8 oz (83.4 kg)  12/09/19 189 lb (85.7 kg)  10/06/19 195 lb (88.5 kg)    Physical Exam: BP 130/80   Pulse 73   Ht 5\' 1"  (1.549 m)   Wt 183 lb 12.8 oz (83.4 kg)   SpO2 100%   BMI 34.73 kg/m  Physical Exam Constitutional:       Appearance: Normal appearance.  Neck:     Vascular: No carotid bruit, hepatojugular reflux or JVD.  Cardiovascular:     Rate and Rhythm: Normal rate and regular rhythm.  No extrasystoles are present.    Chest Wall: PMI is not displaced.     Pulses: Decreased pulses (Mildly decreased pedal pulses).     Heart sounds: S1 normal and S2 normal. Heart sounds are distant. Murmur heard.  High-pitched harsh crescendo-decrescendo early systolic murmur is present with a grade of 1/6 at the upper right sternal border radiating to the neck. No friction rub. No gallop.   Pulmonary:     Effort: Pulmonary effort is normal. No respiratory distress.     Breath sounds: Normal breath sounds. No wheezing or rhonchi.  Chest:     Chest wall: No tenderness.  Musculoskeletal:        General: Deformity (Significant kyphoscoliosis) present.     Cervical back: Rigidity (Stiff somewhat contorted neck.;  Borderline torticollis) present.  Neurological:     Mental Status: She is alert and oriented to person, place, and time.     Cranial Nerves: No cranial nerve deficit.     Gait: Gait abnormal (Slow, wide-based gait using walker.).  Psychiatric:        Behavior: Behavior normal.        Thought Content: Thought content normal.        Judgment: Judgment normal.     Comments: See physical depressed today.  Not her usual Jovial self     Adult ECG Report  Rate: 73;  Rhythm: normal sinus rhythm, sinus arrhythmia and  LAFB - Non-specific ST-T changes - consider Lateral ischemia;   Narrative Interpretation: Stable EKG.  Recent Labs:    11/15/2020: TC 160, TG 103, HDL 60, LDL 81.  A1c 5.6.  Hgb 12.5,Cr 1.36, K+ 4.1.  TSH 1.64. Lab Results  Component Value Date   CHOL 190 10/08/2018   HDL 84 10/08/2018   LDLCALC 92 10/08/2018   TRIG 71 10/08/2018   CHOLHDL 2.3 10/08/2018   Lab Results  Component Value Date   CREATININE 1.28 (H) 10/08/2018   BUN 28 (H) 10/08/2018   NA 142 10/08/2018   K 4.0 10/08/2018   CL 104  10/08/2018   CO2 23 10/08/2018   CBC Latest Ref Rng & Units 10/08/2018 08/23/2018 08/22/2018  WBC 3.4 - 10.8 x10E3/uL 12.0(H) 8.2 7.9  Hemoglobin 11.1 - 15.9 g/dL 11.5 9.7(L) 10.0(L)  Hematocrit 34.0 - 46.6 % 33.3(L) 30.3(L) 31.2(L)  Platelets 150 - 450 x10E3/uL 274 283 269    Lab Results  Component Value Date   TSH 1.932 08/18/2018    ==================================================  COVID-19 Education: The signs and symptoms of COVID-19 were discussed with the patient and how to seek care for testing (follow up with PCP or arrange E-visit).   The importance of social distancing and COVID-19 vaccination was discussed today. The patient is practicing social distancing & Masking.   I spent a total of 33 minutes with the patient spent in direct patient consultation.  Additional time spent with chart review  / charting (studies, outside notes, etc): 16 min Total Time: 49 min  Current medicines are reviewed at length with the patient today.  (+/- concerns) N/A  This visit occurred during the SARS-CoV-2 public health emergency.  Safety protocols were in place, including screening questions prior to the visit, additional usage of staff PPE, and extensive cleaning of exam room while observing appropriate contact time as indicated for disinfecting solutions.  Notice: This dictation was prepared with Dragon dictation along with smaller phrase technology. Any transcriptional errors that result from this process are unintentional and may not be corrected upon review.  Patient Instructions / Medication Changes & Studies & Tests Ordered   Patient Instructions  Medication Instructions:  No changes  *If you need a refill on your cardiac medications before your next appointment, please call your pharmacy*   Lab Work:  Not needed   Testing/Procedures: Will be schedule at [facility] has requested that you have an echocardiogram. Echocardiography is  a painless test that uses sound waves to create images of your heart. It provides your doctor with information about the size and shape of your heart and how well your heart's chambers and valves are working. This procedure takes approximately one hour. There are no restrictions for this procedure.   Monitor will be mailed to you- Your physician has recommended that you wear a 3 day Zio holter monitor. Holter monitors are medical devices that record the heart's electrical activity. Doctors most often use these monitors to diagnose arrhythmias. Arrhythmias are problems with the speed or rhythm of the heartbeat. The monitor is a small, portable device. You can wear one while you do your normal daily activities. This is usually used to diagnose what is causing palpitations/syncope (passing out).  Follow-Up: At [facility], you and your health needs are our priority.  As part of our continuing mission to provide you with exceptional heart care, we have created designated Provider Care Teams.  These Care Teams include your primary Cardiologist (  physician) and Advanced Practice Providers (APPs -  Physician Assistants and Nurse Practitioners) who all work together to provide you with the care you need, when you need it.    Your next appointment:   1 month(s)  The format for your next appointment:   in person or virtual   Provider:   Glenetta Hew, MD   Other Instructions Recommend you use your C-Pap at bedtime and anytime you take a nap   ZIO XT- Long Term Monitor Instructions  -> Instructions provided in AVS Your physician has requested you wear your ZIO patch monitor____3___days.    Studies Ordered:   Orders Placed This Encounter  Procedures  . LONG TERM MONITOR (3-14 DAYS)  . EKG 12-Lead  . ECHOCARDIOGRAM COMPLETE     Glenetta Hew, M.D., M.S. Interventional Cardiologist   Pager # [phone] Phone # [phone] [address] [place], [place] [address]   Thank  you for choosing Heartcare at [facility]!!

## 2021-01-24 ENCOUNTER — Encounter: Payer: Self-pay | Admitting: Physical Therapy

## 2021-01-24 ENCOUNTER — Ambulatory Visit: Payer: Medicare HMO | Attending: Family Medicine | Admitting: Physical Therapy

## 2021-01-24 DIAGNOSIS — M542 Cervicalgia: Secondary | ICD-10-CM | POA: Diagnosis present

## 2021-01-24 DIAGNOSIS — G8929 Other chronic pain: Secondary | ICD-10-CM | POA: Diagnosis present

## 2021-01-24 DIAGNOSIS — M545 Low back pain, unspecified: Secondary | ICD-10-CM | POA: Insufficient documentation

## 2021-01-24 DIAGNOSIS — R293 Abnormal posture: Secondary | ICD-10-CM

## 2021-01-24 DIAGNOSIS — M6281 Muscle weakness (generalized): Secondary | ICD-10-CM | POA: Diagnosis present

## 2021-01-24 DIAGNOSIS — R2689 Other abnormalities of gait and mobility: Secondary | ICD-10-CM | POA: Insufficient documentation

## 2021-01-24 NOTE — Therapy (Signed)
[facility] [address] [place], [place], [address] Phone: [phone]   Fax:  [phone]  Physical Therapy Treatment  Patient Details  Name: Tricia Harrison MRN: [id number] Date of Birth: 1946/06/21 Referring Provider (PT): Lujean Amel, MD   Encounter Date: 01/24/2021   PT End of Session - 01/24/21 0938    Visit Number 9    Date for PT Re-Evaluation 02/26/21    PT Start Time 0927    PT Stop Time 1010    PT Time Calculation (min) 43 min    Activity Tolerance Patient tolerated treatment well    Behavior During Therapy [facility] for tasks assessed/performed           Past Medical History:  Diagnosis Date  . Anemia    as a child  . Asthma    as a child  . Chronic back pain    spinal stenosis and buldging disc;scoliosis  . Chronic constipation    occasionally takes something OTC  . Diabetes mellitus without complication ([place])    per pt "Pre" for 20+yrs  . Diverticulosis   . Gallstones    has known about this for 3-67yrs  . GERD (gastroesophageal reflux disease)    takes Omeprazole daily  . H/O hiatal hernia   . Hemorrhoids   . History of blood transfusion    no abnormal reaction noted  . History of bronchitis    states its been a long time ago  . History of gout   . HLD (hyperlipidemia)    takes Fenofibrate daily  . HTN (hypertension)    takes Diltiazem and Ramipril daily  . Hypothyroidism    takes Synthroid daily  . Left knee DJD   . Nocturia   . Palpitations    started in 2013 and only occasionally;last time noticed about 2-3wks ago and after drinking caffeine  . PONV (postoperative nausea and vomiting)   . 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    Past Surgical History:  Procedure Laterality Date  . COLONOSCOPY    . CT ANGIOGRAM OF CHEST - PE PROTOCOL  07/2018    Bilateral nonocclusive pulmonary emboli evidence of right heart strain consistent with "submassive "/intermediate PE.  -->  Code PE called.  Marland Kitchen DILATION AND CURETTAGE OF UTERUS     multiple about 6-7 times  . ESOPHAGOGASTRODUODENOSCOPY    . NM VQ LUNG SCAN ([place] [facility])  11/23/2018   Low probability for PE  . RIGHT/LEFT HEART CATH AND CORONARY ANGIOGRAPHY N/A 08/19/2018   Procedure: RIGHT/LEFT HEART CATH AND CORONARY ANGIOGRAPHY;  Surgeon: Jettie Booze, MD;  Location: [place] [facility];  Service: Cardiovascular:: Nonobstructive CAD.  Mild pulmonary pretension.  Mean PA pressure 26 mmHg with PA P 48/20 mmHg.  Normal LVEDP.  . THYROID SURGERY Right 2010  . TOTAL HIP ARTHROPLASTY Left 2005  . TOTAL KNEE ARTHROPLASTY Left 02/21/2014   Procedure: TOTAL KNEE ARTHROPLASTY;  Surgeon: Lorn Junes, MD;  Location: [place];  Service: Orthopedics;  Laterality: Left;  . TRANSTHORACIC ECHOCARDIOGRAM  11/2019   Normal EF 60 to 65%.  Moderate LVH-GR one DD.  R WMA.  Mild aortic sclerosis with trivial AI.  Grossly normal pulmonary valve) with no significant dilatation.  Normal RV and RA size.  Normal RV function.  Normal PA pressures.  . TRANSTHORACIC ECHOCARDIOGRAM  07/2018    (In setting of acute PE) mild LVH.  EF 55 to 60%.  GR 1 DD.  Aortic sclerosis but no stenosis.  Mild regurgitation.  Mild RA dilation.  Moderate LA dilation.  Moderate tricuspid regurgitation with severe primary hypertension estimated PA pressure 71 mmHg.  RV poorly visualized  . TRANSTHORACIC ECHOCARDIOGRAM  11/16/2018   EF 60-65%.  GR 1 DD.  Focal basal hypertrophy.  Moderate aortic regurgitation.  No stenosis.  Mild LA dilation.  Normal RV size and function.  Moderate PI but mild TR.  Peak PA pressure is now estimated 38 mmHg.  Marland Kitchen VAGINAL HYSTERECTOMY  1979    There were no vitals filed for this visit.   Subjective Assessment - 01/24/21 0940    Subjective My trip was a disaster. Getting off the plane I had sharp, stabbing pain in  my LT hip. Stayed in bed most of time, high dose antinflammatory helped a little. Pt saw Ortho MD when she returned to [place]. He thinks it is coming from her back but she did get a cortisone shot in her hip. It feels sore and tight today.    Pertinent History Lt THR 2005, Lt TKR 2015, spinal stenosis, scoliosis, DM, HLD, HTN, Hx of PE    Currently in Pain? Yes    Pain Score 7     Pain Location Hip    Pain Orientation Left    Pain Descriptors / Indicators Tightness;Sore    Aggravating Factors  Walking standing not sure why hip exacerbated on the trip    Pain Relieving Factors meds                             [facility] Adult PT Treatment/Exercise - 01/24/21 0001      Knee/Hip Exercises: Supine   Other Supine Knee/Hip Exercises Gentle hip mobiilty in hooklying; all planes to tolerance      Manual Therapy   Manual therapy comments Gentle LTLE neural flossing with PTA assit    Soft tissue mobilization Addaday assit to LT lateral hip in hooklying   Manual hands on to proximal quad                   PT Short Term Goals - 01/01/21 0939      PT SHORT TERM GOAL #1   Title Pt will be ind with initial HEP    Status Achieved      PT SHORT TERM GOAL #2   Title Pt will be able to participate in 3 min walk test with short breaks as needed    Baseline 3:30 walk test x 260' no breaks 01/01/21    Status Achieved      PT SHORT TERM GOAL #3   Title Pt will demo 5x sit to stand using UEs and AD as needed in </= 28 sec    Baseline 22 sec 01/01/21    Status Achieved      PT SHORT TERM GOAL #4   Title Pt will reduce TUG time to </= 28 sec    Baseline 21 sec 01/01/21    Status Achieved             PT Long Term Goals - 01/01/21 0953      PT LONG TERM GOAL #3   Title Pt will demo reduced fall risk by reducing both TUG time and 5x sit to stand with no AD or least restrictive AD to </=  24 sec.    Baseline 22 sec for 5x sit to stand, 21 sec for TUG    Status Achieved                  Plan - 01/24/21 1011    Clinical Impression Statement Pt returns from trip with increased Lt hip pain most notably in standing and when walking. The initial airplane ride seemed to be the trigger. MD comments it seems to be coming from "your back." Pt reports the pain is improving with medication and a cortisone shot. Pt was most comfortbale in the hooklying position where we attempted general Lt hip mobility in all planes including some gentle neural flossing. Pt tolerated the supine position well but when she stood up to walk to the lobby pain/pulling in teh Lt hip was present although it was a few points lower than when she arrived. PTA suggested to continue to rest and perhaps let the nerve calm down until we see her on Monday.    Personal Factors and Comorbidities Age;Comorbidity 1;Comorbidity 2;Comorbidity 3+    Comorbidities spinal stenosis, Hx of THR/TKR on Lt, Hx of bil PE, cardiovascular deconditioning    Examination-Activity Limitations Locomotion Level;Transfers;Bed Mobility;Bend;Squat;Dressing;Stairs;Stand    Examination-Participation Restrictions Meal Prep;Community Activity;Driving;Laundry;Shop;Cleaning    Stability/Clinical Decision Making Evolving/Moderate complexity    Rehab Potential Good    PT Frequency 2x / week    PT Duration 12 weeks    PT Treatment/Interventions ADLs/Self Care Home Management;Aquatic Therapy;Electrical Stimulation;Moist Heat;DME Instruction;Functional mobility training;Gait training;Therapeutic activities;Therapeutic exercise;Balance training;Neuromuscular re-education;Manual techniques;Patient/family education;Passive range of motion;Joint Manipulations;Spinal Manipulations;Taping    PT Next Visit Plan Assess her Lt hip symptoms and go from there. 10th visit PN    PT Home Exercise Plan Access Code: [email or web address]    Consulted and Agree with Plan of Care Patient           Patient will benefit from skilled therapeutic intervention in order to  improve the following deficits and impairments:  Abnormal gait,Decreased range of motion,Difficulty walking,Cardiopulmonary status limiting activity,Decreased endurance,Pain,Decreased activity tolerance,Decreased balance,Impaired flexibility,Hypomobility,Improper body mechanics,Postural dysfunction,Decreased strength,Decreased mobility  Visit Diagnosis: Muscle weakness (generalized)  Chronic midline low back pain without sciatica  Abnormal posture  Cervicalgia  Other abnormalities of gait and mobility     Problem List Patient Active Problem List   Diagnosis Date Noted  . Obstructive sleep apnea 03/09/2019  . Aortic regurgitation 12/02/2018  . History of pulmonary embolism 08/20/2018  . DOE (dyspnea on exertion)   . 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  . Bilateral ovarian cysts   . Hx of gallstones   . Hernia, hiatal   . Renal cysts, acquired, bilateral   . GERD (gastroesophageal reflux disease)   . Diverticulosis   . Lumbar spinal stenosis   . Diabetes mellitus without complication ([place])   . Left knee DJD   . Rapid palpitations 12/06/2013  . Morbid obesity ([place]) 12/06/2013  . Fatigue 12/06/2013  . PVC (premature ventricular contraction) 12/06/2013  . Essential hypertension   . Hypothyroidism   . Hyperlipidemia LDL goal <100     Tricia Harrison, PTA 01/24/2021, 11:04 AM  [facility] [address] [place], [place], [address] Phone: [phone]   Fax:  [phone]  Name: Tricia Harrison MRN: [id number] Date of Birth: 23-Nov-1946

## 2021-01-26 DIAGNOSIS — R002 Palpitations: Secondary | ICD-10-CM

## 2021-01-28 ENCOUNTER — Encounter: Payer: Self-pay | Admitting: Cardiology

## 2021-01-28 NOTE — Assessment & Plan Note (Signed)
Thankfully, her LDL is now 81.  She is taking Crestor 20 mg daily and tolerating it relatively well along with fenofibrate.  She restarted statin because of myalgia/fatigue symptoms did not change with statin holiday..  Stable labs for now.  Continue  Closely monitor with the combination of statin plus fibrate.

## 2021-01-28 NOTE — Assessment & Plan Note (Signed)
Previously noted as mild AI with aortic sclerosis.  We are rechecking 2D echocardiogram because of exertional dyspnea and palpitations.  We will reassess aortic valve disease.

## 2021-01-28 NOTE — Assessment & Plan Note (Signed)
I suspect that the negative reason for dizziness related to deconditioning, but she is more active now, ambulating or dyspnea.  Recheck 2D echocardiogram for valvular disease and reassess EF  Low threshold to consider ischemic evaluation

## 2021-01-28 NOTE — Assessment & Plan Note (Signed)
Remains on Eliquis-essentially lifelong.  We will check a 2D echo to reassess EF and RV function/pressures.  Would be okay to hold Eliquis for procedures (2 days from our procedures, 3 days per neurology procedures-spinal or brain).

## 2021-01-28 NOTE — Assessment & Plan Note (Signed)
Blood pressure looks pretty well controlled today on current dose of carvedilol, diltiazem and ramipril..  No suggestion of bradycardia.

## 2021-01-28 NOTE — Assessment & Plan Note (Addendum)
She has had PVCs in the past.  Now feeling irregular heartbeats and rapid heartbeats.  Need to exclude true arrhythmias or significant PVC burden.  Especially in light of her having worsening dyspnea.  Plan:  3-day Zio patch monitor  2D echo  Continue beta-blocker and calcium channel blocker at current doses for now.

## 2021-01-28 NOTE — Assessment & Plan Note (Signed)
Previously well controlled on beta-blocker, now she is having symptoms again.  Will reassess with 3-day monitor.

## 2021-01-29 ENCOUNTER — Other Ambulatory Visit: Payer: Self-pay

## 2021-01-29 ENCOUNTER — Ambulatory Visit: Payer: Medicare HMO | Admitting: Physical Therapy

## 2021-01-29 ENCOUNTER — Encounter: Payer: Self-pay | Admitting: Physical Therapy

## 2021-01-29 DIAGNOSIS — G8929 Other chronic pain: Secondary | ICD-10-CM

## 2021-01-29 DIAGNOSIS — M545 Low back pain, unspecified: Secondary | ICD-10-CM

## 2021-01-29 DIAGNOSIS — M6281 Muscle weakness (generalized): Secondary | ICD-10-CM | POA: Diagnosis not present

## 2021-01-29 DIAGNOSIS — R293 Abnormal posture: Secondary | ICD-10-CM

## 2021-01-29 NOTE — Therapy (Signed)
[facility] [address] [place], [place], [address] Phone: [phone]   Fax:  [phone]  Physical Therapy Treatment  Patient Details  Name: Tricia Harrison MRN: [id number] Date of Birth: 1946-11-18 Referring Provider (PT): Lujean Amel, MD  Progress Note Reporting Period 12/04/20  to 01/29/21  See note below for Objective Data and Assessment of Progress/Goals.      Encounter Date: 01/29/2021   PT End of Session - 01/29/21 0942    Visit Number 10    Date for PT Re-Evaluation 02/26/21    Authorization Type [employer]    Progress Note Due on Visit 10    PT Start Time 0930    PT Stop Time 1013    PT Time Calculation (min) 43 min    Activity Tolerance Patient tolerated treatment well    Behavior During Therapy WFL for tasks assessed/performed           Past Medical History:  Diagnosis Date  . Anemia    as a child  . Asthma    as a child  . Chronic back pain    spinal stenosis and buldging disc;scoliosis  . Chronic constipation    occasionally takes something OTC  . Diabetes mellitus without complication ([place])    per pt "Pre" for 20+yrs  . Diverticulosis   . Gallstones    has known about this for 3-54yr  . GERD (gastroesophageal reflux disease)    takes Omeprazole daily  . H/O hiatal hernia   . Hemorrhoids   . History of blood transfusion    no abnormal reaction noted  . History of bronchitis    states its been a long time ago  . History of gout   . HLD (hyperlipidemia)    takes Fenofibrate daily  . HTN (hypertension)    takes Diltiazem and Ramipril daily  . Hypothyroidism    takes Synthroid daily  . Left knee DJD   . Nocturia   . Palpitations    started in 2013 and only occasionally;last time noticed about 2-3wks ago and after drinking caffeine  . PONV (postoperative nausea and vomiting)   . Pulmonary embolism with acute cor pulmonale (HCC) 07/2018   Submassive Bilateral PE -  had Pulm HTN & + Troponin -- PA pressures now back to normal on Echo 10/2018.    Past Surgical History:  Procedure Laterality Date  . COLONOSCOPY    . CT ANGIOGRAM OF CHEST - PE PROTOCOL  07/2018   Bilateral nonocclusive pulmonary emboli evidence of right heart strain consistent with "submassive "/intermediate PE.  -->  Code PE called.  .Marland KitchenDILATION AND CURETTAGE OF UTERUS     multiple about 6-7 times  . ESOPHAGOGASTRODUODENOSCOPY    . [place] V[place]  11/23/2018   Low probability for PE  . RIGHT/LEFT HEART CATH AND CORONARY ANGIOGRAPHY N/A 08/19/2018   Procedure: RIGHT/LEFT HEART CATH AND CORONARY ANGIOGRAPHY;  Surgeon: VJettie Booze MD;  [facility];  Service: Cardiovascular:: Nonobstructive CAD.  Mild pulmonary pretension.  Mean PA pressure 26 mmHg with PA P 48/20 mmHg.  Normal LVEDP.  . THYROID SURGERY Right 2010  . TOTAL HIP ARTHROPLASTY Left 2005  . TOTAL KNEE ARTHROPLASTY Left 02/21/2014   Procedure: TOTAL KNEE ARTHROPLASTY;  Surgeon: RLorn Junes MD;  [place]  Service: Orthopedics;  Laterality: Left;  . TRANSTHORACIC ECHOCARDIOGRAM  11/2019   Normal EF 60 to 65%.  Moderate LVH-GR one  DD.  R WMA.  Mild aortic sclerosis with trivial AI.  Grossly normal pulmonary valve) with no significant dilatation.  Normal RV and RA size.  Normal RV function.  Normal PA pressures.  . TRANSTHORACIC ECHOCARDIOGRAM  07/2018    (In setting of acute PE) mild LVH.  EF 55 to 60%.  GR 1 DD.  Aortic sclerosis but no stenosis.  Mild regurgitation.  Mild RA dilation.  Moderate LA dilation.  Moderate tricuspid regurgitation with severe primary hypertension estimated PA pressure 71 mmHg.  RV poorly visualized  . TRANSTHORACIC ECHOCARDIOGRAM  11/16/2018   EF 60-65%.  GR 1 DD.  Focal basal hypertrophy.  Moderate aortic regurgitation.  No stenosis.  Mild LA dilation.  Normal RV size and function.  Moderate PI but mild TR.  Peak PA pressure is now estimated 38 mmHg.  Marland Kitchen  VAGINAL HYSTERECTOMY  1979    There were no vitals filed for this visit.   Subjective Assessment - 01/29/21 0940    Subjective I am back to baseline from a pain standpoint since my trip.  The cortisone shot into my Lt hip helped.  My Lt leg was very weak when I had to walk into a restaurant after seeing a movie.    Pertinent History Lt THR 2005, Lt TKR 2015, spinal stenosis, scoliosis, DM, HLD, HTN, Hx of PE    Limitations Standing;Walking    How long can you stand comfortably? <5 min    How long can you walk comfortably? household distances, very fatiguing    Patient Stated Goals stand up and walk by myself, no water therapy while it's cold    Currently in Pain? No/denies   had 6/10 pain in back before Tylenol this morning             [facility] PT Assessment - 01/29/21 0001      Assessment   Medical Diagnosis M41.9 (ICD-10-CM) - Scoliosis of cervical spine, unspecified scoliosis type, R29.898 (ICD-10-CM) - Leg weakness, bilateral    Referring Provider (PT) Koirala, Dibas, MD    Onset Date/Surgical Date --   10-15 years ago for back   Hand Dominance Right    Next MD Visit as needed    Prior Therapy yes for back, knee, did aquatic therapy in past      Strength   Overall Strength Comments Lt hip flexion 4-/5, ankle DF 4-/5, hip ext 4-/5, hip abd 4/5      Transfers   Five time sit to stand comments  18 sec      Ambulation/Gait   Assistive device Rolling walker    Gait Pattern Step-through pattern;Decreased step length - left;Trunk flexed    Gait Comments 3' walk test Pt needed to stop at 2:35 due to SOB and UE fatigue as trunk posture became less erect due to fatigue, Lt LE fatigue with amb, Pt covered 231'                         [facility] Adult PT Treatment/Exercise - 01/29/21 0001      Lumbar Exercises: Aerobic   Nustep L2x10 min PT present to review status and pain      Lumbar Exercises: Seated   Other Seated Lumbar Exercises seated blue ball rollouts 5x for  lumbar stretch      Knee/Hip Exercises: Standing   Gait Training along counter Lt march with heel strike for gait x 10 reps then ambulate slowly with emphasis on Lt hip advancement  and heel strike x 20' 2 rounds      Knee/Hip Exercises: Seated   Other Seated Knee/Hip Exercises ankle DF x 20 reps bil    Marching Left;AROM;10 reps    Marching Limitations with TA and upright posture focus    Sit to Sand 2 sets;5 reps;with UE support                    PT Short Term Goals - 01/01/21 0939      PT SHORT TERM GOAL #1   Title Pt will be ind with initial HEP    Status Achieved      PT SHORT TERM GOAL #2   Title Pt will be able to participate in 3 min walk test with short breaks as needed    Baseline 3:30 walk test x 260' no breaks 01/01/21    Status Achieved      PT SHORT TERM GOAL #3   Title Pt will demo 5x sit to stand using UEs and AD as needed in </= 28 sec    Baseline 22 sec 01/01/21    Status Achieved      PT SHORT TERM GOAL #4   Title Pt will reduce TUG time to </= 28 sec    Baseline 21 sec 01/01/21    Status Achieved             PT Long Term Goals - 01/29/21 1014      PT LONG TERM GOAL #1   Title Pt will improve LE strength to at least 4/5 bil for improved ease with transfers in/out of car, bed and chair    Status On-going      PT LONG TERM GOAL #2   Title Pt will be able to particpate in 6 min walk test with AD and short breaks as needed to demo improved gait endurance.    Baseline limited to 3' walk    Status On-going      PT LONG TERM GOAL #3   Title Pt will demo reduced fall risk by reducing both TUG time and 5x sit to stand with no AD or least restrictive AD to </= 24 sec.    Baseline met for 5x sit to stand, ongoing for TUG    Status On-going      PT LONG TERM GOAL #4   Title Pt will tolerate light household standing tasks up to 15 min for improved meal prep and light cleaning.    Baseline 5'    Status On-going      PT LONG TERM GOAL #5   Title Pt  will report improved confidence in balance and stability with short distance community ambulation by at least 50%.    Status On-going                 Plan - 01/29/21 0954    Clinical Impression Statement Pt had a recent signif set back after flying to [facility].  A cortisone shot into Lt hip has improved pain and Pt reports she is back to baseline where she was before trip.  Pt reports limited walking endurance secondary to SOB, LBP, Lt leg fatigue.  Her goals remain to get her legs stronger and walk taller and longer.  She has dropped more time with 5x sit to stand today, completing in 18 sec (from 22 sec) with light UE use.  She attemped a 3' walk test but Pt needed to stop at 2:35 due to SOB and  UE fatigue as trunk posture became less erect due to fatigue.  PT observed Lt LE fatigue with amb; Pt covered 231' in 2:35.  She has ongoing weakness in Lt LE specifically gluts, hip flexors and ankle DF which carry over into limited Lt LE advancement and clearance with gait.  Session focused on restoring functional strength, transfer endurance with sit to stand, and gait endurance.  Continue along POC.    Personal Factors and Comorbidities Age;Comorbidity 1;Comorbidity 2;Comorbidity 3+    Comorbidities spinal stenosis, Hx of THR/TKR on Lt, Hx of bil PE, cardiovascular deconditioning    Examination-Activity Limitations Locomotion Level;Transfers;Bed Mobility;Bend;Squat;Dressing;Stairs;Stand    Examination-Participation Restrictions Meal Prep;Community Activity;Driving;Laundry;Shop;Cleaning    Stability/Clinical Decision Making Evolving/Moderate complexity    Rehab Potential Good    PT Frequency 2x / week    PT Duration 12 weeks    PT Treatment/Interventions ADLs/Self Care Home Management;Aquatic Therapy;Electrical Stimulation;Moist Heat;DME Instruction;Functional mobility training;Gait training;Therapeutic activities;Therapeutic exercise;Balance training;Neuromuscular re-education;Manual  techniques;Patient/family education;Passive range of motion;Joint Manipulations;Spinal Manipulations;Taping    PT Next Visit Plan continue working on Lt LE strength (hip flexion, ankle DF for gait), gait endurance trials of 3' walk test each visit, functional strength    PT Home Exercise Plan Access Code: [email or web address]    Consulted and Agree with Plan of Care Patient           Patient will benefit from skilled therapeutic intervention in order to improve the following deficits and impairments:  Abnormal gait,Decreased range of motion,Difficulty walking,Cardiopulmonary status limiting activity,Decreased endurance,Pain,Decreased activity tolerance,Decreased balance,Impaired flexibility,Hypomobility,Improper body mechanics,Postural dysfunction,Decreased strength,Decreased mobility  Visit Diagnosis: Muscle weakness (generalized)  Chronic midline low back pain without sciatica  Abnormal posture     Problem List Patient Active Problem List   Diagnosis Date Noted  . Obstructive sleep apnea 03/09/2019  . Aortic regurgitation 12/02/2018  . History of pulmonary embolism 08/20/2018  . DOE (dyspnea on exertion)   . 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  . Bilateral ovarian cysts   . Hx of gallstones   . Hernia, hiatal   . Renal cysts, acquired, bilateral   . GERD (gastroesophageal reflux disease)   . Diverticulosis   . Lumbar spinal stenosis   . Diabetes mellitus without complication ([place])   . Left knee DJD   . Rapid palpitations 12/06/2013  . Morbid obesity ([place]) 12/06/2013  . Fatigue 12/06/2013  . PVC (premature ventricular contraction) 12/06/2013  . Essential hypertension   . Hypothyroidism   . Hyperlipidemia LDL goal <100     Johanna E Beuhring 01/29/2021, 10:15 AM  [facility] [address] [place], [place], [address] Phone: [phone]   Fax:  [phone]  Name: Finn Altemose MRN:  [id number] Date of Birth: 1946-10-01

## 2021-01-31 ENCOUNTER — Other Ambulatory Visit: Payer: Self-pay

## 2021-01-31 ENCOUNTER — Encounter: Payer: Self-pay | Admitting: Physical Therapy

## 2021-01-31 ENCOUNTER — Ambulatory Visit: Payer: Medicare HMO | Admitting: Physical Therapy

## 2021-01-31 DIAGNOSIS — R2689 Other abnormalities of gait and mobility: Secondary | ICD-10-CM

## 2021-01-31 DIAGNOSIS — M6281 Muscle weakness (generalized): Secondary | ICD-10-CM

## 2021-01-31 DIAGNOSIS — M545 Low back pain, unspecified: Secondary | ICD-10-CM

## 2021-01-31 DIAGNOSIS — G8929 Other chronic pain: Secondary | ICD-10-CM

## 2021-01-31 DIAGNOSIS — M542 Cervicalgia: Secondary | ICD-10-CM

## 2021-01-31 DIAGNOSIS — R293 Abnormal posture: Secondary | ICD-10-CM

## 2021-01-31 NOTE — Therapy (Addendum)
[facility] [address], [place], [place], [address] Phone: [phone]   Fax:  [phone]  Physical Therapy Treatment  Patient Details  Name: Tricia Harrison MRN: [id number] Date of Birth: 25-Feb-1946 Referring Provider (PT): Lujean Amel, MD   Encounter Date: 01/31/2021   PT End of Session - 01/31/21 0936    Visit Number 11    Date for PT Re-Evaluation 02/26/21    Authorization Type [employer] Medicare    Progress Note Due on Visit 10    PT Start Time 0932    PT Stop Time 1010    PT Time Calculation (min) 38 min           Past Medical History:  Diagnosis Date  . Anemia    as a child  . Asthma    as a child  . Chronic back pain    spinal stenosis and buldging disc;scoliosis  . Chronic constipation    occasionally takes something OTC  . Diabetes mellitus without complication ([place])    per pt "Pre" for 20+yrs  . Diverticulosis   . Gallstones    has known about this for 3-84yr  . GERD (gastroesophageal reflux disease)    takes Omeprazole daily  . H/O hiatal hernia   . Hemorrhoids   . History of blood transfusion    no abnormal reaction noted  . History of bronchitis    states its been a long time ago  . History of gout   . HLD (hyperlipidemia)    takes Fenofibrate daily  . HTN (hypertension)    takes Diltiazem and Ramipril daily  . Hypothyroidism    takes Synthroid daily  . Left knee DJD   . Nocturia   . Palpitations    started in 2013 and only occasionally;last time noticed about 2-3wks ago and after drinking caffeine  . PONV (postoperative nausea and vomiting)   . 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    Past Surgical History:  Procedure Laterality Date  . COLONOSCOPY    . CT ANGIOGRAM OF CHEST - PE PROTOCOL  07/2018   Bilateral nonocclusive pulmonary emboli  evidence of right heart strain consistent with "submassive "/intermediate PE.  -->  Code PE called.  .Marland KitchenDILATION AND CURETTAGE OF UTERUS     multiple about 6-7 times  . ESOPHAGOGASTRODUODENOSCOPY    . NM VQ LUNG SCAN ([place])  11/23/2018   Low probability for PE  . RIGHT/LEFT HEART CATH AND CORONARY ANGIOGRAPHY N/A 08/19/2018   Procedure: RIGHT/LEFT HEART CATH AND CORONARY ANGIOGRAPHY;  Surgeon: VJettie Booze MD;  [facility];  Service: Cardiovascular:: Nonobstructive CAD.  Mild pulmonary pretension.  Mean PA pressure 26 mmHg with PA P 48/20 mmHg.  Normal LVEDP.  . THYROID SURGERY Right 2010  . TOTAL HIP ARTHROPLASTY Left 2005  . TOTAL KNEE ARTHROPLASTY Left 02/21/2014   Procedure: TOTAL KNEE ARTHROPLASTY;  Surgeon: RLorn Junes MD;  [place] [facility]  Service: Orthopedics;  Laterality: Left;  . TRANSTHORACIC ECHOCARDIOGRAM  11/2019   Normal EF 60 to 65%.  Moderate LVH-GR one DD.  R WMA.  Mild aortic sclerosis with trivial AI.  Grossly normal pulmonary valve) with no significant dilatation.  Normal RV and RA size.  Normal RV function.  Normal PA pressures.  . TRANSTHORACIC ECHOCARDIOGRAM  07/2018    (  In setting of acute PE) mild LVH.  EF 55 to 60%.  GR 1 DD.  Aortic sclerosis but no stenosis.  Mild regurgitation.  Mild RA dilation.  Moderate LA dilation.  Moderate tricuspid regurgitation with severe primary hypertension estimated PA pressure 71 mmHg.  RV poorly visualized  . TRANSTHORACIC ECHOCARDIOGRAM  11/16/2018   EF 60-65%.  GR 1 DD.  Focal basal hypertrophy.  Moderate aortic regurgitation.  No stenosis.  Mild LA dilation.  Normal RV size and function.  Moderate PI but mild TR.  Peak PA pressure is now estimated 38 mmHg.  Marland Kitchen VAGINAL HYSTERECTOMY  1979    There were no vitals filed for this visit.   Subjective Assessment - 01/31/21 0935    Subjective "My breathing has been getting in the way"    Pertinent History Lt THR 2005, Lt TKR 2015, spinal stenosis,  scoliosis, DM, HLD, HTN, Hx of PE    Limitations Standing;Walking    How long can you stand comfortably? <5 min    How long can you walk comfortably? household distances, very fatiguing    Patient Stated Goals stand up and walk by myself, no water therapy while it's cold    Currently in Pain? No/denies              [facility] PT Assessment - 01/31/21 0001      Ambulation/Gait   Assistive device Rolling walker    Gait Pattern Step-through pattern;Scissoring;Narrow base of support;Poor foot clearance - left;Poor foot clearance - right    Gait Comments 3' walk test; 278 ft   no rest required; VC for erect trunk; increased scissoring gait with fatigue                        [facility] Adult PT Treatment/Exercise - 01/31/21 0001      Lumbar Exercises: Seated   Other Seated Lumbar Exercises blue ball rollouts 15x into flexion; 6x into R flex with rotation   VC for maintaining hip contact with table     Knee/Hip Exercises: Aerobic   Nustep L3x8 min      Knee/Hip Exercises: Seated   Clamshell with TheraBand Yellow   x30                   PT Short Term Goals - 01/31/21 1009      PT SHORT TERM GOAL #2   Title Pt will be able to participate in 3 min walk test with short breaks as needed    Time 4    Period Weeks    Status Achieved; 278 feet in 3 minutes without rest break   Target Date 01/01/21             PT Long Term Goals - 01/29/21 1014      PT LONG TERM GOAL #1   Title Pt will improve LE strength to at least 4/5 bil for improved ease with transfers in/out of car, bed and chair    Status On-going      PT LONG TERM GOAL #2   Title Pt will be able to particpate in 6 min walk test with AD and short breaks as needed to demo improved gait endurance.    Baseline limited to 3' walk    Status On-going      PT LONG TERM GOAL #3   Title Pt will demo reduced fall risk by reducing both TUG time and 5x sit to stand with no AD or  least restrictive AD to </= 24  sec.    Baseline met for 5x sit to stand, ongoing for TUG    Status On-going      PT LONG TERM GOAL #4   Title Pt will tolerate light household standing tasks up to 15 min for improved meal prep and light cleaning.    Baseline 5'    Status On-going      PT LONG TERM GOAL #5   Title Pt will report improved confidence in balance and stability with short distance community ambulation by at least 50%.    Status On-going                 Plan - 01/31/21 0936    Clinical Impression Statement Patient reports decreased confidence in LE endurance this date when discussing 3MWT but is willing to attempt again this date. Continues to have increased BIL UE pain due to increased UE support when ambulating with RW. Improving 3 MWT distance from 231 to 278 feet. Reports no increased pain at end of today's session. Would benefit from continued skilled intervention to address impairments for improved functional activity tolerance and decreased pain.    Personal Factors and Comorbidities Age;Comorbidity 1;Comorbidity 2;Comorbidity 3+    Comorbidities spinal stenosis, Hx of THR/TKR on Lt, Hx of bil PE, cardiovascular deconditioning    Examination-Activity Limitations Locomotion Level;Transfers;Bed Mobility;Bend;Squat;Dressing;Stairs;Stand    Examination-Participation Restrictions Meal Prep;Community Activity;Driving;Laundry;Shop;Cleaning    Rehab Potential Good    PT Frequency 2x / week    PT Duration 12 weeks    PT Treatment/Interventions ADLs/Self Care Home Management;Aquatic Therapy;Electrical Stimulation;Moist Heat;DME Instruction;Functional mobility training;Gait training;Therapeutic activities;Therapeutic exercise;Balance training;Neuromuscular re-education;Manual techniques;Patient/family education;Passive range of motion;Joint Manipulations;Spinal Manipulations;Taping    PT Next Visit Plan continue working on Lt LE strength (hip flexion, ankle DF for gait), gait endurance trials of 3' walk test  each visit, functional strength    PT Home Exercise Plan Access Code: [email or web address]    Consulted and Agree with Plan of Care Patient           Patient will benefit from skilled therapeutic intervention in order to improve the following deficits and impairments:  Abnormal gait,Decreased range of motion,Difficulty walking,Cardiopulmonary status limiting activity,Decreased endurance,Pain,Decreased activity tolerance,Decreased balance,Impaired flexibility,Hypomobility,Improper body mechanics,Postural dysfunction,Decreased strength,Decreased mobility  Visit Diagnosis: Muscle weakness (generalized)  Chronic midline low back pain without sciatica  Abnormal posture  Cervicalgia  Other abnormalities of gait and mobility     Problem List Patient Active Problem List   Diagnosis Date Noted  . Obstructive sleep apnea 03/09/2019  . Aortic regurgitation 12/02/2018  . History of pulmonary embolism 08/20/2018  . DOE (dyspnea on exertion)   . 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  . Bilateral ovarian cysts   . Hx of gallstones   . Hernia, hiatal   . Renal cysts, acquired, bilateral   . GERD (gastroesophageal reflux disease)   . Diverticulosis   . Lumbar spinal stenosis   . Diabetes mellitus without complication ([place])   . Left knee DJD   . Rapid palpitations 12/06/2013  . Morbid obesity ([place]) 12/06/2013  . Fatigue 12/06/2013  . PVC (premature ventricular contraction) 12/06/2013  . Essential hypertension   . Hypothyroidism   . Hyperlipidemia LDL goal <100     Everardo All PT, DPT  01/31/21 10:17 AM Myrene Galas, PTA 01/31/21 11:00 AM  [facility] [address] [place], [place], [address] Phone: [phone]   Fax:  [phone]  Name:  Laresha Bacorn MRN: [id number] Date of Birth: 1946/07/26

## 2021-02-05 ENCOUNTER — Ambulatory Visit: Payer: Medicare HMO | Admitting: Physical Therapy

## 2021-02-05 ENCOUNTER — Encounter: Payer: Self-pay | Admitting: Physical Therapy

## 2021-02-05 ENCOUNTER — Other Ambulatory Visit: Payer: Self-pay

## 2021-02-05 DIAGNOSIS — M545 Low back pain, unspecified: Secondary | ICD-10-CM

## 2021-02-05 DIAGNOSIS — R2689 Other abnormalities of gait and mobility: Secondary | ICD-10-CM

## 2021-02-05 DIAGNOSIS — M6281 Muscle weakness (generalized): Secondary | ICD-10-CM

## 2021-02-05 DIAGNOSIS — R293 Abnormal posture: Secondary | ICD-10-CM

## 2021-02-05 DIAGNOSIS — G8929 Other chronic pain: Secondary | ICD-10-CM

## 2021-02-05 NOTE — Therapy (Signed)
[facility] [address] [place], [place], [address] Phone: [phone]   Fax:  [phone]  Physical Therapy Treatment  Patient Details  Name: Tricia Harrison MRN: [id number] Date of Birth: 01-Mar-1946 Referring Provider (PT): Lujean Amel, MD   Encounter Date: 02/05/2021   PT End of Session - 02/05/21 0924    Visit Number 12    Date for PT Re-Evaluation 02/26/21    Authorization Type Aetna Medicare    Progress Note Due on Visit 20    PT Start Time 0926    PT Stop Time 1015    PT Time Calculation (min) 49 min    Activity Tolerance Patient tolerated treatment well    Behavior During Therapy [facility] for tasks assessed/performed           Past Medical History:  Diagnosis Date  . Anemia    as a child  . Asthma    as a child  . Chronic back pain    spinal stenosis and buldging disc;scoliosis  . Chronic constipation    occasionally takes something OTC  . Diabetes mellitus without complication ([place])    per pt "Pre" for 20+yrs  . Diverticulosis   . Gallstones    has known about this for 3-64yr  . GERD (gastroesophageal reflux disease)    takes Omeprazole daily  . H/O hiatal hernia   . Hemorrhoids   . History of blood transfusion    no abnormal reaction noted  . History of bronchitis    states its been a long time ago  . History of gout   . HLD (hyperlipidemia)    takes Fenofibrate daily  . HTN (hypertension)    takes Diltiazem and Ramipril daily  . Hypothyroidism    takes Synthroid daily  . Left knee DJD   . Nocturia   . Palpitations    started in 2013 and only occasionally;last time noticed about 2-3wks ago and after drinking caffeine  . PONV (postoperative nausea and vomiting)   . 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    Past Surgical History:  Procedure Laterality  Date  . COLONOSCOPY    . CT ANGIOGRAM OF CHEST - PE PROTOCOL  07/2018   Bilateral nonocclusive pulmonary emboli evidence of right heart strain consistent with "submassive "/intermediate PE.  -->  Code PE called.  .Marland KitchenDILATION AND CURETTAGE OF UTERUS     multiple about 6-7 times  . ESOPHAGOGASTRODUODENOSCOPY    . NM VQ LUNG SCAN ([place])  11/23/2018   Low probability for PE  . RIGHT/LEFT HEART CATH AND CORONARY ANGIOGRAPHY N/A 08/19/2018   Procedure: RIGHT/LEFT HEART CATH AND CORONARY ANGIOGRAPHY;  Surgeon: VJettie Booze MD;  [facility];  Service: Cardiovascular:: Nonobstructive CAD.  Mild pulmonary pretension.  Mean PA pressure 26 mmHg with PA P 48/20 mmHg.  Normal LVEDP.  . THYROID SURGERY Right 2010  . TOTAL HIP ARTHROPLASTY Left 2005  . TOTAL KNEE ARTHROPLASTY Left 02/21/2014   Procedure: TOTAL KNEE ARTHROPLASTY;  Surgeon: RLorn Junes MD;  [place] [facility]  Service: Orthopedics;  Laterality: Left;  . TRANSTHORACIC ECHOCARDIOGRAM  11/2019   Normal EF 60 to 65%.  Moderate LVH-GR one DD.  R WMA.  Mild aortic sclerosis with trivial AI.  Grossly normal pulmonary valve) with no significant dilatation.  Normal RV  and RA size.  Normal RV function.  Normal PA pressures.  . TRANSTHORACIC ECHOCARDIOGRAM  07/2018    (In setting of acute PE) mild LVH.  EF 55 to 60%.  GR 1 DD.  Aortic sclerosis but no stenosis.  Mild regurgitation.  Mild RA dilation.  Moderate LA dilation.  Moderate tricuspid regurgitation with severe primary hypertension estimated PA pressure 71 mmHg.  RV poorly visualized  . TRANSTHORACIC ECHOCARDIOGRAM  11/16/2018   EF 60-65%.  GR 1 DD.  Focal basal hypertrophy.  Moderate aortic regurgitation.  No stenosis.  Mild LA dilation.  Normal RV size and function.  Moderate PI but mild TR.  Peak PA pressure is now estimated 38 mmHg.  Marland Kitchen VAGINAL HYSTERECTOMY  1979    There were no vitals filed for this visit.   Subjective Assessment - 02/05/21 0928    Subjective  My pain level is good this morning b/c I took 3 Tylenol.  If we are going to walk I need to do that first b/c the NuStep wears out my legs.  My back feels fine unless I move.  5/10 pain with meds this morning.    Pertinent History Lt THR 2005, Lt TKR 2015, spinal stenosis, scoliosis, DM, HLD, HTN, Hx of PE    Limitations Standing;Walking    How long can you stand comfortably? <5 min    How long can you walk comfortably? household distances, very fatiguing    Patient Stated Goals stand up and walk by myself, no water therapy while it's cold    Currently in Pain? Yes    Pain Score 5     Pain Location Back    Pain Orientation Mid;Left    Pain Descriptors / Indicators Tightness;Sore    Pain Type Chronic pain    Pain Onset More than a month ago    Pain Frequency Intermittent    Aggravating Factors  walking, standing    Pain Relieving Factors meds, sitting              [facility] PT Assessment - 02/05/21 0001      Timed Up and Go Test   TUG Normal TUG    Normal TUG (seconds) 17    TUG Comments 17 sec with RW, 14 sec no walker close supervision, used light UE assist with UEs for sit to stand                         [facility] Adult PT Treatment/Exercise - 02/05/21 0001      Ambulation/Gait   Gait Comments 3' walk test with RW x 264f, trunk fatigue and Lt LE fatigue with some scissoring and foot flat vs heel strike, improved SOB/endurance today      Exercises   Exercises Lumbar;Knee/Hip;Shoulder      Lumbar Exercises: Aerobic   Nustep L4 x 6' PT cued goal 40spm pace      Lumbar Exercises: Seated   Other Seated Lumbar Exercises sit tall into green band around trunk 10x5" holds, PT pulling band as anchor      Knee/Hip Exercises: Standing   Gait Training TUG test: 17 sec with walker x 1, 4x without walker with focus on footwork for turns and upright trunk with gait, close supervision, 14 sec x 4 reps      Knee/Hip Exercises: Seated   Long Arc Quad Strengthening;Both;1  set;10 reps;Weights    Long Arc Quad Weight 3 lbs.    Other Seated Knee/Hip Exercises ankle  DF x 20 reps bil    Marching Both;2 sets;10 reps;Weights    Marching Limitations with UE press into foam roller for TA activation    Marching Weights 3 lbs.      Shoulder Exercises: Seated   Extension Strengthening;Both;15 reps;Theraband    Theraband Level (Shoulder Extension) Level 3 (Green)    Extension Limitations 10x5" hold, then 5 reps    Row Both;Strengthening;Theraband;15 reps    Theraband Level (Shoulder Row) Level 3 (Green)    Row Limitations 10x5" hold, then 5 reps                    PT Short Term Goals - 02/05/21 0958      PT SHORT TERM GOAL #1   Title Pt will be ind with initial HEP    Status Achieved      PT SHORT TERM GOAL #2   Title Pt will be able to participate in 3 min walk test with short breaks as needed    Baseline 3:00 x 298 feet no breaks    Status Achieved      PT SHORT TERM GOAL #3   Title Pt will demo 5x sit to stand using UEs and AD as needed in </= 28 sec    Status Achieved      PT SHORT TERM GOAL #4   Title Pt will reduce TUG time to </= 28 sec    Status Achieved             PT Long Term Goals - 02/05/21 0952      PT LONG TERM GOAL #1   Title Pt will improve LE strength to at least 4/5 bil for improved ease with transfers in/out of car, bed and chair    Status On-going      PT LONG TERM GOAL #2   Title Pt will be able to particpate in 6 min walk test with AD and short breaks as needed to demo improved gait endurance.    Baseline walked just beyond 3' walk today to complete a lap, working toward 4'    Status On-going      PT [place] #3   Title Pt will demo reduced fall risk by reducing both TUG time and 5x sit to stand with no AD or least restrictive AD to </= 24 sec.    Baseline 17 sec TUG, previously met for 5x sit to stand    Status Achieved      PT LONG TERM GOAL #4   Title Pt will tolerate light household standing tasks  up to 15 min for improved meal prep and light cleaning.    Baseline pain limits 1-5' due to no pain med use when at home, takes seated breaks when cooking    Status On-going      PT LONG TERM GOAL #5   Title Pt will report improved confidence in balance and stability with short distance community ambulation by at least 50%.    Baseline Pt realizes she is doing things she didn't try before but can't but a % to it    Status On-going                 Plan - 02/05/21 1006    Clinical Impression Statement Pt met LTG today for TUG test improving her time to 17 sec using RW.  She was also able to perform without RW in 76' with PT giving close supervision, although Pt does not  amublate without AD other than within her home.  She also set a new record distance with 3' walk test to 298' and reported reduced SOB and less use of UEs on walker.  PT did note increased Lt LE scissoring and foot flat placement with increased trunk flexion as she fatigued.  She was able to complete a clinic lap after the 3' timer stopped as she is working towards a 6' walk test goal for improved gait endurance.  She is limited in standing tolerance at home with cooking tasks needing seated breaks due to pain within 1' as she does not use Tylenol for pain control other than when she comes for PT appointments.  PT encouraged her to start working on standing endurance at kitchen counter for 1-2' at a time as tol.  Pt will continue to benefit from skilled PT along POC with focus on improved gait endurance and Lt LE functional strength.    PT Next Visit Plan try for 4' walk test, Lt LE strength ankle DF/hip flexion and abd for gait, seated trunk ext isom with band, seated row/ext green, NuStep level 4 goal 40spm pace    Consulted and Agree with Plan of Care Patient           Patient will benefit from skilled therapeutic intervention in order to improve the following deficits and impairments:     Visit Diagnosis: Muscle  weakness (generalized)  Chronic midline low back pain without sciatica  Abnormal posture  Other abnormalities of gait and mobility     Problem List Patient Active Problem List   Diagnosis Date Noted  . Obstructive sleep apnea 03/09/2019  . Aortic regurgitation 12/02/2018  . History of pulmonary embolism 08/20/2018  . DOE (dyspnea on exertion)   . 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  . Bilateral ovarian cysts   . Hx of gallstones   . Hernia, hiatal   . Renal cysts, acquired, bilateral   . GERD (gastroesophageal reflux disease)   . Diverticulosis   . Lumbar spinal stenosis   . Diabetes mellitus without complication ([place])   . Left knee DJD   . Rapid palpitations 12/06/2013  . Morbid obesity ([place]) 12/06/2013  . Fatigue 12/06/2013  . PVC (premature ventricular contraction) 12/06/2013  . Essential hypertension   . Hypothyroidism   . Hyperlipidemia LDL goal <100     Baruch Merl, PT 02/05/21 10:17 AM   [facility] [address], [place], [place], [address] Phone: [phone]   Fax:  [phone]  Name: Tricia Harrison MRN: [id number] Date of Birth: Mar 12, 1946

## 2021-02-07 ENCOUNTER — Encounter: Payer: Self-pay | Admitting: Physical Therapy

## 2021-02-07 ENCOUNTER — Other Ambulatory Visit: Payer: Self-pay

## 2021-02-07 ENCOUNTER — Ambulatory Visit: Payer: Medicare HMO | Admitting: Physical Therapy

## 2021-02-07 DIAGNOSIS — G8929 Other chronic pain: Secondary | ICD-10-CM

## 2021-02-07 DIAGNOSIS — M6281 Muscle weakness (generalized): Secondary | ICD-10-CM | POA: Diagnosis not present

## 2021-02-07 DIAGNOSIS — R2689 Other abnormalities of gait and mobility: Secondary | ICD-10-CM

## 2021-02-07 DIAGNOSIS — M542 Cervicalgia: Secondary | ICD-10-CM

## 2021-02-07 DIAGNOSIS — R293 Abnormal posture: Secondary | ICD-10-CM

## 2021-02-07 NOTE — Therapy (Signed)
[facility] [address] [place], [place], [address] Phone: [phone]   Fax:  [phone]  Physical Therapy Treatment  Patient Details  Name: Tricia Harrison MRN: [id number] Date of Birth: [birth year] Referring Provider (PT): Lujean Amel, MD   Encounter Date: 02/07/2021   PT End of Session - 02/07/21 0931    Visit Number 13    Date for PT Re-Evaluation 02/26/21    Authorization Type [employer] Medicare    Progress Note Due on Visit 90    PT Start Time 0931    PT Stop Time 1010    PT Time Calculation (min) 39 min    Activity Tolerance --    Behavior During Therapy [facility] for tasks assessed/performed           Past Medical History:  Diagnosis Date  . Anemia    as a child  . Asthma    as a child  . Chronic back pain    spinal stenosis and buldging disc;scoliosis  . Chronic constipation    occasionally takes something OTC  . Diabetes mellitus without complication ([place])    per pt "Pre" for 20+yrs  . Diverticulosis   . Gallstones    has known about this for 3-3yr  . GERD (gastroesophageal reflux disease)    takes Omeprazole daily  . H/O hiatal hernia   . Hemorrhoids   . History of blood transfusion    no abnormal reaction noted  . History of bronchitis    states its been a long time ago  . History of gout   . HLD (hyperlipidemia)    takes Fenofibrate daily  . HTN (hypertension)    takes Diltiazem and Ramipril daily  . Hypothyroidism    takes Synthroid daily  . Left knee DJD   . Nocturia   . Palpitations    started in 2013 and only occasionally;last time noticed about 2-3wks ago and after drinking caffeine  . PONV (postoperative nausea and vomiting)   . 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    Past Surgical History:  Procedure Laterality Date  . COLONOSCOPY    . CT  ANGIOGRAM OF CHEST - PE PROTOCOL  07/2018   Bilateral nonocclusive pulmonary emboli evidence of right heart strain consistent with "submassive "/intermediate PE.  -->  Code PE called.  .Marland KitchenDILATION AND CURETTAGE OF UTERUS     multiple about 6-7 times  . ESOPHAGOGASTRODUODENOSCOPY    . [place])  11/23/2018   Low probability for PE  . RIGHT/LEFT HEART CATH AND CORONARY ANGIOGRAPHY N/A 08/19/2018   Procedure: RIGHT/LEFT HEART CATH AND CORONARY ANGIOGRAPHY;  Surgeon: VJettie Booze MD;  [facility];  Service: Cardiovascular:: Nonobstructive CAD.  Mild pulmonary pretension.  Mean PA pressure 26 mmHg with PA P 48/20 mmHg.  Normal LVEDP.  . THYROID SURGERY Right 2010  . TOTAL HIP ARTHROPLASTY Left 2005  . TOTAL KNEE ARTHROPLASTY Left 02/21/2014   Procedure: TOTAL KNEE ARTHROPLASTY;  Surgeon: RLorn Junes MD;  [facility]  Service: Orthopedics;  Laterality: Left;  . TRANSTHORACIC ECHOCARDIOGRAM  11/2019   Normal EF 60 to 65%.  Moderate LVH-GR one DD.  R WMA.  Mild aortic sclerosis with trivial AI.  Grossly normal pulmonary valve) with no significant dilatation.  Normal RV and RA size.  Normal RV function.  Normal PA pressures.  . TRANSTHORACIC ECHOCARDIOGRAM  07/2018    (In setting of acute PE) mild LVH.  EF 55 to 60%.  GR 1 DD.  Aortic sclerosis but no stenosis.  Mild regurgitation.  Mild RA dilation.  Moderate LA dilation.  Moderate tricuspid regurgitation with severe primary hypertension estimated PA pressure 71 mmHg.  RV poorly visualized  . TRANSTHORACIC ECHOCARDIOGRAM  11/16/2018   EF 60-65%.  GR 1 DD.  Focal basal hypertrophy.  Moderate aortic regurgitation.  No stenosis.  Mild LA dilation.  Normal RV size and function.  Moderate PI but mild TR.  Peak PA pressure is now estimated 38 mmHg.  Marland Kitchen VAGINAL HYSTERECTOMY  1979    There were no vitals filed for this visit.                      [facility] Adult PT Treatment/Exercise - 02/07/21  0001      Ambulation/Gait   Assistive device Rolling walker    Gait Comments 3 min 40 sec before needing to sit: pt able to use > LE than UE, no arm pain      Lumbar Exercises: Aerobic   Nustep L4 x 8 min difficult to maintain > 40 SPM d/t end of session and pt fatigued cardiovascularly.      Knee/Hip Exercises: Seated   Clamshell with TheraBand Red   loop 2x10   Sit to Sand 2 sets;5 reps   hands on chair, VC to stand erect upon standing                   PT Short Term Goals - 02/05/21 0958      PT SHORT TERM GOAL #1   Title Pt will be ind with initial HEP    Status Achieved      PT SHORT TERM GOAL #2   Title Pt will be able to participate in 3 min walk test with short breaks as needed    Baseline 3:00 x 298 feet no breaks    Status Achieved      PT SHORT TERM GOAL #3   Title Pt will demo 5x sit to stand using UEs and AD as needed in </= 28 sec    Status Achieved      PT SHORT TERM GOAL #4   Title Pt will reduce TUG time to </= 28 sec    Status Achieved             PT Long Term Goals - 02/05/21 0952      PT LONG TERM GOAL #1   Title Pt will improve LE strength to at least 4/5 bil for improved ease with transfers in/out of car, bed and chair    Status On-going      PT LONG TERM GOAL #2   Title Pt will be able to particpate in 6 min walk test with AD and short breaks as needed to demo improved gait endurance.    Baseline walked just beyond 3' walk today to complete a lap, working toward 4'    Status On-going      PT [place] #3   Title Pt will demo reduced fall risk by reducing both TUG time and 5x sit to stand with no AD or least restrictive AD to </= 24 sec.    Baseline 17 sec TUG, previously met for 5x sit to stand    Status Achieved  PT LONG TERM GOAL #4   Title Pt will tolerate light household standing tasks up to 15 min for improved meal prep and light cleaning.    Baseline pain limits 1-5' due to no pain med use when at home, takes  seated breaks when cooking    Status On-going      PT LONG TERM GOAL #5   Title Pt will report improved confidence in balance and stability with short distance community ambulation by at least 50%.    Baseline Pt realizes she is doing things she didn't try before but can't but a % to it    Status On-going                 Plan - 02/07/21 6283    Clinical Impression Statement Pt walked 3 min and 40 sec, progressing to LTG of 6 min with RW. Pt will shoot for 4 min next session. Pt felt she was using more LE today than UE. Pt demonstrated improved standing posture with sit to stand exercise today, no increase in back pain. SOB still limits most exercise. PTA encouraged pt to move more at home, taking frequent, short walks.    Personal Factors and Comorbidities Age;Comorbidity 1;Comorbidity 2;Comorbidity 3+    Comorbidities spinal stenosis, Hx of THR/TKR on Lt, Hx of bil PE, cardiovascular deconditioning    Examination-Activity Limitations Locomotion Level;Transfers;Bed Mobility;Bend;Squat;Dressing;Stairs;Stand    Examination-Participation Restrictions Meal Prep;Community Activity;Driving;Laundry;Shop;Cleaning    Stability/Clinical Decision Making Evolving/Moderate complexity    Rehab Potential Good    PT Frequency 2x / week    PT Duration 12 weeks    PT Treatment/Interventions ADLs/Self Care Home Management;Aquatic Therapy;Electrical Stimulation;Moist Heat;DME Instruction;Functional mobility training;Gait training;Therapeutic activities;Therapeutic exercise;Balance training;Neuromuscular re-education;Manual techniques;Patient/family education;Passive range of motion;Joint Manipulations;Spinal Manipulations;Taping    PT Next Visit Plan 4 min walk with RW,    PT Home Exercise Plan Access Code: [email or web address]    Consulted and Agree with Plan of Care Patient           Patient will benefit from skilled therapeutic intervention in order to improve the following deficits and impairments:  Abnormal  gait,Decreased range of motion,Difficulty walking,Cardiopulmonary status limiting activity,Decreased endurance,Pain,Decreased activity tolerance,Decreased balance,Impaired flexibility,Hypomobility,Improper body mechanics,Postural dysfunction,Decreased strength,Decreased mobility  Visit Diagnosis: Muscle weakness (generalized)  Chronic midline low back pain without sciatica  Abnormal posture  Cervicalgia  Other abnormalities of gait and mobility     Problem List Patient Active Problem List   Diagnosis Date Noted  . Obstructive sleep apnea 03/09/2019  . Aortic regurgitation 12/02/2018  . History of pulmonary embolism 08/20/2018  . DOE (dyspnea on exertion)   . 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  . Bilateral ovarian cysts   . Hx of gallstones   . Hernia, hiatal   . Renal cysts, acquired, bilateral   . GERD (gastroesophageal reflux disease)   . Diverticulosis   . Lumbar spinal stenosis   . Diabetes mellitus without complication ([place])   . Left knee DJD   . Rapid palpitations 12/06/2013  . Morbid obesity ([place]) 12/06/2013  . Fatigue 12/06/2013  . PVC (premature ventricular contraction) 12/06/2013  . Essential hypertension   . Hypothyroidism   . Hyperlipidemia LDL goal <100     Maxi Carreras, PTA 02/07/2021, 10:06 AM  [facility] [address] [place], [place], [address] Phone: [phone]   Fax:  [phone]  Name: Tricia Harrison MRN: [id number] Date of Birth: October 30, 1946

## 2021-02-12 ENCOUNTER — Other Ambulatory Visit: Payer: Self-pay

## 2021-02-12 ENCOUNTER — Ambulatory Visit: Payer: Medicare HMO | Admitting: Physical Therapy

## 2021-02-12 ENCOUNTER — Encounter: Payer: Self-pay | Admitting: Physical Therapy

## 2021-02-12 DIAGNOSIS — M6281 Muscle weakness (generalized): Secondary | ICD-10-CM | POA: Diagnosis not present

## 2021-02-12 DIAGNOSIS — R2689 Other abnormalities of gait and mobility: Secondary | ICD-10-CM

## 2021-02-12 DIAGNOSIS — R293 Abnormal posture: Secondary | ICD-10-CM

## 2021-02-12 DIAGNOSIS — M545 Low back pain, unspecified: Secondary | ICD-10-CM

## 2021-02-12 DIAGNOSIS — G8929 Other chronic pain: Secondary | ICD-10-CM

## 2021-02-12 NOTE — Therapy (Signed)
[facility] [address] [place], [place], [address] Phone: [phone]   Fax:  [phone]  Physical Therapy Treatment  Patient Details  Name: Tricia Harrison MRN: [id number] Date of Birth: February 10, 1946 Referring Provider (PT): Lujean Amel, MD   Encounter Date: 02/12/2021   PT End of Session - 02/12/21 0757    Visit Number 14    Date for PT Re-Evaluation 02/26/21    Authorization Type [employer]etna Medicare    Progress Note Due on Visit 20    PT Start Time 0800    PT Stop Time 0843    PT Time Calculation (min) 43 min    Activity Tolerance Patient tolerated treatment well    Behavior During Therapy [facility] for tasks assessed/performed           Past Medical History:  Diagnosis Date  . Anemia    as a child  . Asthma    as a child  . Chronic back pain    spinal stenosis and buldging disc;scoliosis  . Chronic constipation    occasionally takes something OTC  . Diabetes mellitus without complication ([place])    per pt "Pre" for 20+yrs  . Diverticulosis   . Gallstones    has known about this for 3-72yr  . GERD (gastroesophageal reflux disease)    takes Omeprazole daily  . H/O hiatal hernia   . Hemorrhoids   . History of blood transfusion    no abnormal reaction noted  . History of bronchitis    states its been a long time ago  . History of gout   . HLD (hyperlipidemia)    takes Fenofibrate daily  . HTN (hypertension)    takes Diltiazem and Ramipril daily  . Hypothyroidism    takes Synthroid daily  . Left knee DJD   . Nocturia   . Palpitations    started in 2013 and only occasionally;last time noticed about 2-3wks ago and after drinking caffeine  . PONV (postoperative nausea and vomiting)   . 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    Past Surgical History:  Procedure Laterality  Date  . COLONOSCOPY    . CT ANGIOGRAM OF CHEST - PE PROTOCOL  07/2018   Bilateral nonocclusive pulmonary emboli evidence of right heart strain consistent with "submassive "/intermediate PE.  -->  Code PE called.  .Marland KitchenDILATION AND CURETTAGE OF UTERUS     multiple about 6-7 times  . ESOPHAGOGASTRODUODENOSCOPY    . [place] V[place] SCAN ([place])  11/23/2018   Low probability for PE  . RIGHT/LEFT HEART CATH AND CORONARY ANGIOGRAPHY N/A 08/19/2018   Procedure: RIGHT/LEFT HEART CATH AND CORONARY ANGIOGRAPHY;  Surgeon: VJettie Booze MD;  [facility];  Service: Cardiovascular:: Nonobstructive CAD.  Mild pulmonary pretension.  Mean PA pressure 26 mmHg with PA P 48/20 mmHg.  Normal LVEDP.  . THYROID SURGERY Right 2010  . TOTAL HIP ARTHROPLASTY Left 2005  . TOTAL KNEE ARTHROPLASTY Left 02/21/2014   Procedure: TOTAL KNEE ARTHROPLASTY;  Surgeon: RLorn Junes MD;  [facility]  Service: Orthopedics;  Laterality: Left;  . TRANSTHORACIC ECHOCARDIOGRAM  11/2019   Normal EF 60 to 65%.  Moderate LVH-GR one DD.  R WMA.  Mild aortic sclerosis with trivial AI.  Grossly normal pulmonary valve) with no significant dilatation.  Normal RV  and RA size.  Normal RV function.  Normal PA pressures.  . TRANSTHORACIC ECHOCARDIOGRAM  07/2018    (In setting of acute PE) mild LVH.  EF 55 to 60%.  GR 1 DD.  Aortic sclerosis but no stenosis.  Mild regurgitation.  Mild RA dilation.  Moderate LA dilation.  Moderate tricuspid regurgitation with severe primary hypertension estimated PA pressure 71 mmHg.  RV poorly visualized  . TRANSTHORACIC ECHOCARDIOGRAM  11/16/2018   EF 60-65%.  GR 1 DD.  Focal basal hypertrophy.  Moderate aortic regurgitation.  No stenosis.  Mild LA dilation.  Normal RV size and function.  Moderate PI but mild TR.  Peak PA pressure is now estimated 38 mmHg.  Marland Kitchen VAGINAL HYSTERECTOMY  1979    There were no vitals filed for this visit.   Subjective Assessment - 02/12/21 0801    Subjective  Pt arrives for 8am appt without taking daily Tylenol.  I tried standing at counter and it didn't work.  If I am moving I do better.    Pertinent History Lt THR 2005, Lt TKR 2015, spinal stenosis, scoliosis, DM, HLD, HTN, Hx of PE    Limitations Standing;Walking    How long can you stand comfortably? <5 min    How long can you walk comfortably? household distances, very fatiguing    Patient Stated Goals stand up and walk by myself, no water therapy while it's cold    Currently in Pain? Yes    Pain Score 5     Pain Location Back    Pain Orientation Mid;Lower    Pain Descriptors / Indicators Tightness;Sore    Pain Type Chronic pain    Pain Radiating Towards Rt lateral calf    Pain Onset More than a month ago    Pain Frequency Intermittent    Aggravating Factors  static standing, walking > 3-4'    Pain Relieving Factors meds, sitting                             [facility] Adult PT Treatment/Exercise - 02/12/21 0001      Ambulation/Gait   Ambulation/Gait Yes    Assistive device Rolling walker    Gait Comments 4 min 18 sec x 398 feet, final minute fatigue with increasing flexed trunk and UE fatigue reported as biggest limiting factor      Exercises   Exercises Shoulder;Lumbar;Knee/Hip      Lumbar Exercises: Aerobic   Nustep L4 x 5' active recovery end of session PT present to monitor, goal 40SPM but Pt remained in 30s due to end of session fatigue      Knee/Hip Exercises: Standing   Other Standing Knee Exercises standing endurance with square stance weight shifting, VC for quad and glut med activation x 2', LBP and leg weakness limited to 2'      Knee/Hip Exercises: Seated   Clamshell with TheraBand Yellow   20 reps   Other Seated Knee/Hip Exercises toe raises/ankle DF x 15 reps bil      Shoulder Exercises: Seated   Extension Strengthening;Both;15 reps;Theraband    Theraband Level (Shoulder Extension) Level 3 (Green)    Extension Limitations 10x5" hold, then 5 reps     Row Both;Strengthening;Theraband;15 reps    Theraband Level (Shoulder Row) Level 3 (Green)    Row Limitations 10x5" hold, then 5 reps  PT Short Term Goals - 02/05/21 0958      PT SHORT TERM GOAL #1   Title Pt will be ind with initial HEP    Status Achieved      PT SHORT TERM GOAL #2   Title Pt will be able to participate in 3 min walk test with short breaks as needed    Baseline 3:00 x 298 feet no breaks    Status Achieved      PT SHORT TERM GOAL #3   Title Pt will demo 5x sit to stand using UEs and AD as needed in </= 28 sec    Status Achieved      PT SHORT TERM GOAL #4   Title Pt will reduce TUG time to </= 28 sec    Status Achieved             PT Long Term Goals - 02/05/21 0952      PT LONG TERM GOAL #1   Title Pt will improve LE strength to at least 4/5 bil for improved ease with transfers in/out of car, bed and chair    Status On-going      PT LONG TERM GOAL #2   Title Pt will be able to particpate in 6 min walk test with AD and short breaks as needed to demo improved gait endurance.    Baseline walked just beyond 3' walk today to complete a lap, working toward 4'    Status On-going      PT [place] #3   Title Pt will demo reduced fall risk by reducing both TUG time and 5x sit to stand with no AD or least restrictive AD to </= 24 sec.    Baseline 17 sec TUG, previously met for 5x sit to stand    Status Achieved      PT LONG TERM GOAL #4   Title Pt will tolerate light household standing tasks up to 15 min for improved meal prep and light cleaning.    Baseline pain limits 1-5' due to no pain med use when at home, takes seated breaks when cooking    Status On-going      PT LONG TERM GOAL #5   Title Pt will report improved confidence in balance and stability with short distance community ambulation by at least 50%.    Baseline Pt realizes she is doing things she didn't try before but can't but a % to it    Status On-going                  Plan - 02/12/21 6629    Clinical Impression Statement Pt with 8am appointment today and arrived without taking Tylenol.  PT had Pt perform seated postural strengthening to cue trunk and LE muscles needed for gait prior to walk test.  Pt is working toward 6MWT and today was able to perform ambulation with RW x 4 min 18 sec x 398 feet, displaying final minute fatigue with increasing flexed trunk and UE fatigue reported as biggest limiting factor vs back or legs.  She continues to be limited in static standing tolerance secondary to back and LE pain.  PT worked with her on weight shifting at counter in standing with VC to engage quad and glut med to break up static nature of standing.  She was able to tolerate this x 2' with LBP and leg weakness as limiting factors.  Pt reported she was amazed that she wasn't having more pain  by end of session given no meds today and she also noted (and PT observed) that she is no longer limited by SOB as she used to be.  Continue along POC with ongoing assessment.    Comorbidities spinal stenosis, Hx of THR/TKR on Lt, Hx of bil PE, cardiovascular deconditioning           Patient will benefit from skilled therapeutic intervention in order to improve the following deficits and impairments:     Visit Diagnosis: Muscle weakness (generalized)  Chronic midline low back pain without sciatica  Abnormal posture  Other abnormalities of gait and mobility     Problem List Patient Active Problem List   Diagnosis Date Noted  . Obstructive sleep apnea 03/09/2019  . Aortic regurgitation 12/02/2018  . History of pulmonary embolism 08/20/2018  . DOE (dyspnea on exertion)   . 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  . Bilateral ovarian cysts   . Hx of gallstones   . Hernia, hiatal   . Renal cysts, acquired, bilateral   . GERD (gastroesophageal reflux disease)   . Diverticulosis   . Lumbar spinal stenosis   . Diabetes mellitus  without complication ([place])   . Left knee DJD   . Rapid palpitations 12/06/2013  . Morbid obesity ([place]) 12/06/2013  . Fatigue 12/06/2013  . PVC (premature ventricular contraction) 12/06/2013  . Essential hypertension   . Hypothyroidism   . Hyperlipidemia LDL goal <100     Baruch Merl, PT 02/12/21 8:40 AM   [facility] [address] [place], [place], [address] Phone: [phone]   Fax:  [phone]  Name: Tricia Harrison MRN: [id number] Date of Birth: Mar 27, 1946

## 2021-02-16 ENCOUNTER — Encounter: Payer: Self-pay | Admitting: Physical Therapy

## 2021-02-16 ENCOUNTER — Other Ambulatory Visit: Payer: Self-pay

## 2021-02-16 ENCOUNTER — Ambulatory Visit: Payer: Medicare HMO | Admitting: Physical Therapy

## 2021-02-16 DIAGNOSIS — R2689 Other abnormalities of gait and mobility: Secondary | ICD-10-CM

## 2021-02-16 DIAGNOSIS — M6281 Muscle weakness (generalized): Secondary | ICD-10-CM

## 2021-02-16 DIAGNOSIS — M542 Cervicalgia: Secondary | ICD-10-CM

## 2021-02-16 DIAGNOSIS — R293 Abnormal posture: Secondary | ICD-10-CM

## 2021-02-16 DIAGNOSIS — G8929 Other chronic pain: Secondary | ICD-10-CM

## 2021-02-16 DIAGNOSIS — M545 Low back pain, unspecified: Secondary | ICD-10-CM

## 2021-02-16 NOTE — Therapy (Signed)
[facility] [address], [place], [place], [address] Phone: [phone]   Fax:  [phone]  Physical Therapy Treatment  Patient Details  Name: Tricia Harrison MRN: [id number] Date of Birth: [birth year] Referring Provider (PT): Lujean Amel, MD   Encounter Date: 02/16/2021   PT End of Session - 02/16/21 0848    Visit Number 15    Date for PT Re-Evaluation 02/26/21    Authorization Type [employer]    Progress Note Due on Visit 20    PT Start Time 0846    PT Stop Time 0929    PT Time Calculation (min) 43 min    Activity Tolerance Patient limited by pain    Behavior During Therapy [facility] for tasks assessed/performed           Past Medical History:  Diagnosis Date  . Anemia    as a child  . Asthma    as a child  . Chronic back pain    spinal stenosis and buldging disc;scoliosis  . Chronic constipation    occasionally takes something OTC  . Diabetes mellitus without complication ([place])    per pt "Pre" for 20+yrs  . Diverticulosis   . Gallstones    has known about this for 3-26yr  . GERD (gastroesophageal reflux disease)    takes Omeprazole daily  . H/O hiatal hernia   . Hemorrhoids   . History of blood transfusion    no abnormal reaction noted  . History of bronchitis    states its been a long time ago  . History of gout   . HLD (hyperlipidemia)    takes Fenofibrate daily  . HTN (hypertension)    takes Diltiazem and Ramipril daily  . Hypothyroidism    takes Synthroid daily  . Left knee DJD   . Nocturia   . Palpitations    started in 2013 and only occasionally;last time noticed about 2-3wks ago and after drinking caffeine  . PONV (postoperative nausea and vomiting)   . 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    Past Surgical History:  Procedure Laterality Date  .  COLONOSCOPY    . CT ANGIOGRAM OF CHEST - PE PROTOCOL  07/2018   Bilateral nonocclusive pulmonary emboli evidence of right heart strain consistent with "submassive "/intermediate PE.  -->  Code PE called.  .Marland KitchenDILATION AND CURETTAGE OF UTERUS     multiple about 6-7 times  . ESOPHAGOGASTRODUODENOSCOPY    . NM VQ LUNG SCAN ([facility])  11/23/2018   Low probability for PE  . RIGHT/LEFT HEART CATH AND CORONARY ANGIOGRAPHY N/A 08/19/2018   Procedure: RIGHT/LEFT HEART CATH AND CORONARY ANGIOGRAPHY;  Surgeon: VJettie Booze MD;  [facility];  Service: Cardiovascular:: Nonobstructive CAD.  Mild pulmonary pretension.  Mean PA pressure 26 mmHg with PA P 48/20 mmHg.  Normal LVEDP.  . THYROID SURGERY Right 2010  . TOTAL HIP ARTHROPLASTY Left 2005  . TOTAL KNEE ARTHROPLASTY Left 02/21/2014   Procedure: TOTAL KNEE ARTHROPLASTY;  Surgeon: RLorn Junes MD;  [facility] [facility]  Service: Orthopedics;  Laterality: Left;  . TRANSTHORACIC ECHOCARDIOGRAM  11/2019   Normal EF 60 to 65%.  Moderate LVH-GR one DD.  R WMA.  Mild aortic sclerosis with trivial AI.  Grossly normal pulmonary valve) with no significant dilatation.  Normal RV  and RA size.  Normal RV function.  Normal PA pressures.  . TRANSTHORACIC ECHOCARDIOGRAM  07/2018    (In setting of acute PE) mild LVH.  EF 55 to 60%.  GR 1 DD.  Aortic sclerosis but no stenosis.  Mild regurgitation.  Mild RA dilation.  Moderate LA dilation.  Moderate tricuspid regurgitation with severe primary hypertension estimated PA pressure 71 mmHg.  RV poorly visualized  . TRANSTHORACIC ECHOCARDIOGRAM  11/16/2018   EF 60-65%.  GR 1 DD.  Focal basal hypertrophy.  Moderate aortic regurgitation.  No stenosis.  Mild LA dilation.  Normal RV size and function.  Moderate PI but mild TR.  Peak PA pressure is now estimated 38 mmHg.  Marland Kitchen VAGINAL HYSTERECTOMY  1979    There were no vitals filed for this visit.   Subjective Assessment - 02/16/21 0855    Subjective Pt reports  having a bad week: back pain and her breathing. Having Echo on Tuesday.    Pertinent History Lt THR 2005, Lt TKR 2015, spinal stenosis, scoliosis, DM, HLD, HTN, Hx of PE    Currently in Pain? Yes    Pain Score 8     Pain Location Back    Pain Orientation Lower    Pain Descriptors / Indicators Sharp    Multiple Pain Sites No                             [facility] Adult PT Treatment/Exercise - 02/16/21 0001      Ambulation/Gait   Ambulation/Gait Yes    Assistive device Rolling walker    Gait Comments Pt could only walk 2 min 52 sec LTLE started to give out and pt SOB   After rest pt did 2 min 8 sec with RW     Neck Exercises: Seated   Cervical Rotation Both;20 reps      Lumbar Exercises: Aerobic   Nustep L4 x 8 min      Knee/Hip Exercises: Seated   Clamshell with TheraBand Yellow   loop 10x                   PT Short Term Goals - 02/05/21 0958      PT SHORT TERM GOAL #1   Title Pt will be ind with initial HEP    Status Achieved      PT SHORT TERM GOAL #2   Title Pt will be able to participate in 3 min walk test with short breaks as needed    Baseline 3:00 x 298 feet no breaks    Status Achieved      PT SHORT TERM GOAL #3   Title Pt will demo 5x sit to stand using UEs and AD as needed in </= 28 sec    Status Achieved      PT SHORT TERM GOAL #4   Title Pt will reduce TUG time to </= 28 sec    Status Achieved             PT Long Term Goals - 02/05/21 0952      PT LONG TERM GOAL #1   Title Pt will improve LE strength to at least 4/5 bil for improved ease with transfers in/out of car, bed and chair    Status On-going      PT LONG TERM GOAL #2   Title Pt will be able to particpate in 6 min walk test with AD and short breaks as  needed to demo improved gait endurance.    Baseline walked just beyond 3' walk today to complete a lap, working toward 4'    Status On-going      PT [place] #3   Title Pt will demo reduced fall risk by  reducing both TUG time and 5x sit to stand with no AD or least restrictive AD to </= 24 sec.    Baseline 17 sec TUG, previously met for 5x sit to stand    Status Achieved      PT LONG TERM GOAL #4   Title Pt will tolerate light household standing tasks up to 15 min for improved meal prep and light cleaning.    Baseline pain limits 1-5' due to no pain med use when at home, takes seated breaks when cooking    Status On-going      PT LONG TERM GOAL #5   Title Pt will report improved confidence in balance and stability with short distance community ambulation by at least 50%.    Baseline Pt realizes she is doing things she didn't try before but can't but a % to it    Status On-going                 Plan - 02/16/21 0848    Clinical Impression Statement Pt arrives with complaints of high levels of low back pain, this effected her ambulation.Pt's LTLE essentially "gave out" aroun dthe 2 min mark of walking with accompanying increased low back pain. Pt was able to perform 2 bouts of walking, adding up to a total of just over 5 min.    Personal Factors and Comorbidities Age;Comorbidity 1;Comorbidity 2;Comorbidity 3+    Comorbidities spinal stenosis, Hx of THR/TKR on Lt, Hx of bil PE, cardiovascular deconditioning    Examination-Activity Limitations Locomotion Level;Transfers;Bed Mobility;Bend;Squat;Dressing;Stairs;Stand    Examination-Participation Restrictions Meal Prep;Community Activity;Driving;Laundry;Shop;Cleaning    Stability/Clinical Decision Making Evolving/Moderate complexity    Rehab Potential Good    PT Frequency 2x / week    PT Duration 12 weeks    PT Treatment/Interventions ADLs/Self Care Home Management;Aquatic Therapy;Electrical Stimulation;Moist Heat;DME Instruction;Functional mobility training;Gait training;Therapeutic activities;Therapeutic exercise;Balance training;Neuromuscular re-education;Manual techniques;Patient/family education;Passive range of motion;Joint  Manipulations;Spinal Manipulations;Taping    PT Next Visit Plan MMT LE for goals next 1-2 sessions, continue with discussion of DC planning, pt may be interested in [employer] and/or water exercise.    PT Home Exercise Plan Access Code: [email or web address]    Consulted and Agree with Plan of Care Patient           Patient will benefit from skilled therapeutic intervention in order to improve the following deficits and impairments:  Abnormal gait,Decreased range of motion,Difficulty walking,Cardiopulmonary status limiting activity,Decreased endurance,Pain,Decreased activity tolerance,Decreased balance,Impaired flexibility,Hypomobility,Improper body mechanics,Postural dysfunction,Decreased strength,Decreased mobility  Visit Diagnosis: Muscle weakness (generalized)  Chronic midline low back pain without sciatica  Abnormal posture  Other abnormalities of gait and mobility  Cervicalgia     Problem List Patient Active Problem List   Diagnosis Date Noted  . Obstructive sleep apnea 03/09/2019  . Aortic regurgitation 12/02/2018  . History of pulmonary embolism 08/20/2018  . DOE (dyspnea on exertion)   . 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  . Bilateral ovarian cysts   . Hx of gallstones   . Hernia, hiatal   . Renal cysts, acquired, bilateral   . GERD (gastroesophageal reflux disease)   . Diverticulosis   . Lumbar spinal stenosis   . Diabetes mellitus without complication ([place])   .  Left knee DJD   . Rapid palpitations 12/06/2013  . Morbid obesity ([place]) 12/06/2013  . Fatigue 12/06/2013  . PVC (premature ventricular contraction) 12/06/2013  . Essential hypertension   . Hypothyroidism   . Hyperlipidemia LDL goal <100     Keelan Pomerleau, PTA 02/16/2021, 9:30 AM  [facility] [address] [place], [place], [address] Phone: [phone]   Fax:  [phone]  Name: Tricia Harrison MRN:  [id number] Date of Birth: June 15, 1946

## 2021-02-19 ENCOUNTER — Telehealth: Payer: Self-pay | Admitting: Cardiology

## 2021-02-19 NOTE — Telephone Encounter (Signed)
Follow Up:    Pt would like to know if her Monitor results are ready?

## 2021-02-20 ENCOUNTER — Other Ambulatory Visit: Payer: Self-pay

## 2021-02-20 ENCOUNTER — Ambulatory Visit (HOSPITAL_COMMUNITY): Payer: Medicare HMO | Attending: Internal Medicine

## 2021-02-20 DIAGNOSIS — R0609 Other forms of dyspnea: Secondary | ICD-10-CM

## 2021-02-20 DIAGNOSIS — R0600 Dyspnea, unspecified: Secondary | ICD-10-CM | POA: Insufficient documentation

## 2021-02-20 DIAGNOSIS — I493 Ventricular premature depolarization: Secondary | ICD-10-CM | POA: Diagnosis present

## 2021-02-20 DIAGNOSIS — Z86711 Personal history of pulmonary embolism: Secondary | ICD-10-CM | POA: Insufficient documentation

## 2021-02-20 LAB — ECHOCARDIOGRAM COMPLETE
Area-P 1/2: 2.59 cm2
P 1/2 time: 405 msec
S' Lateral: 3.3 cm

## 2021-02-20 NOTE — Telephone Encounter (Signed)
Spoke to patient. Informed patient Dr Ellyn Hack has not reviewed  monitor results, will contact once reviewed. Patient  Had echo done today . Follow up appointment 03/23/21

## 2021-02-21 ENCOUNTER — Other Ambulatory Visit: Payer: Self-pay

## 2021-02-21 ENCOUNTER — Encounter: Payer: Self-pay | Admitting: Physical Therapy

## 2021-02-21 ENCOUNTER — Ambulatory Visit: Payer: Medicare HMO | Admitting: Physical Therapy

## 2021-02-21 DIAGNOSIS — M6281 Muscle weakness (generalized): Secondary | ICD-10-CM

## 2021-02-21 DIAGNOSIS — G8929 Other chronic pain: Secondary | ICD-10-CM

## 2021-02-21 DIAGNOSIS — M545 Low back pain, unspecified: Secondary | ICD-10-CM

## 2021-02-21 DIAGNOSIS — R2689 Other abnormalities of gait and mobility: Secondary | ICD-10-CM

## 2021-02-21 DIAGNOSIS — R293 Abnormal posture: Secondary | ICD-10-CM

## 2021-02-21 DIAGNOSIS — M542 Cervicalgia: Secondary | ICD-10-CM

## 2021-02-21 NOTE — Therapy (Signed)
[facility] [address] [place], [place], [address] Phone: [phone]   Fax:  [phone]  Physical Therapy Treatment  Patient Details  Name: Tricia Harrison MRN: [id number] Date of Birth: 06/14/1946 Referring Provider (PT): Lujean Amel, MD   Encounter Date: 02/21/2021   PT End of Session - 02/21/21 0848    Visit Number 16    Date for PT Re-Evaluation 02/26/21    Authorization Type [employer]na Medicare    Progress Note Due on Visit 20    PT Start Time 0847    PT Stop Time 0927    PT Time Calculation (min) 40 min    Activity Tolerance Patient tolerated treatment well;Other (comment)   Shortness of breath   Behavior During Therapy WFL for tasks assessed/performed           Past Medical History:  Diagnosis Date  . Anemia    as a child  . Asthma    as a child  . Chronic back pain    spinal stenosis and buldging disc;scoliosis  . Chronic constipation    occasionally takes something OTC  . Diabetes mellitus without complication ([place])    per pt "Pre" for 20+yrs  . Diverticulosis   . Gallstones    has known about this for 3-75yr  . GERD (gastroesophageal reflux disease)    takes Omeprazole daily  . H/O hiatal hernia   . Hemorrhoids   . History of blood transfusion    no abnormal reaction noted  . History of bronchitis    states its been a long time ago  . History of gout   . HLD (hyperlipidemia)    takes Fenofibrate daily  . HTN (hypertension)    takes Diltiazem and Ramipril daily  . Hypothyroidism    takes Synthroid daily  . Left knee DJD   . Nocturia   . Palpitations    started in 2013 and only occasionally;last time noticed about 2-3wks ago and after drinking caffeine  . PONV (postoperative nausea and vomiting)   . 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    Past  Surgical History:  Procedure Laterality Date  . COLONOSCOPY    . CT ANGIOGRAM OF CHEST - PE PROTOCOL  07/2018   Bilateral nonocclusive pulmonary emboli evidence of right heart strain consistent with "submassive "/intermediate PE.  -->  Code PE called.  .Marland KitchenDILATION AND CURETTAGE OF UTERUS     multiple about 6-7 times  . ESOPHAGOGASTRODUODENOSCOPY    . NM VQ LUNG SCAN ([facility])  11/23/2018   Low probability for PE  . RIGHT/LEFT HEART CATH AND CORONARY ANGIOGRAPHY N/A 08/19/2018   Procedure: RIGHT/LEFT HEART CATH AND CORONARY ANGIOGRAPHY;  Surgeon: VJettie Booze MD;  [facility];  Service: Cardiovascular:: Nonobstructive CAD.  Mild pulmonary pretension.  Mean PA pressure 26 mmHg with PA P 48/20 mmHg.  Normal LVEDP.  . THYROID SURGERY Right 2010  . TOTAL HIP ARTHROPLASTY Left 2005  . TOTAL KNEE ARTHROPLASTY Left 02/21/2014   Procedure: TOTAL KNEE ARTHROPLASTY;  Surgeon: RLorn Junes MD;  [facility]  Service: Orthopedics;  Laterality: Left;  . TRANSTHORACIC ECHOCARDIOGRAM  11/2019   Normal EF 60 to 65%.  Moderate LVH-GR one DD.  R WMA.  Mild aortic sclerosis with trivial AI.  Grossly normal pulmonary valve) with no  significant dilatation.  Normal RV and RA size.  Normal RV function.  Normal PA pressures.  . TRANSTHORACIC ECHOCARDIOGRAM  07/2018    (In setting of acute PE) mild LVH.  EF 55 to 60%.  GR 1 DD.  Aortic sclerosis but no stenosis.  Mild regurgitation.  Mild RA dilation.  Moderate LA dilation.  Moderate tricuspid regurgitation with severe primary hypertension estimated PA pressure 71 mmHg.  RV poorly visualized  . TRANSTHORACIC ECHOCARDIOGRAM  11/16/2018   EF 60-65%.  GR 1 DD.  Focal basal hypertrophy.  Moderate aortic regurgitation.  No stenosis.  Mild LA dilation.  Normal RV size and function.  Moderate PI but mild TR.  Peak PA pressure is now estimated 38 mmHg.  Marland Kitchen VAGINAL HYSTERECTOMY  1979    There were no vitals filed for this visit.   Subjective  Assessment - 02/21/21 0849    Subjective Pt had Echo done 2 days ago, has not met with MD yet to discuss results. She reports continued shortness of breath with activity.Pt is tired from not sleeping well, pt is nervous about what the Echo shows.    Pertinent History Lt THR 2005, Lt TKR 2015, spinal stenosis, scoliosis, DM, HLD, HTN, Hx of PE    Limitations Standing;Walking    Currently in Pain? Yes    Pain Score 8     Pain Location Back    Pain Orientation Lower    Pain Descriptors / Indicators Sore;Aching    Aggravating Factors  standing and walking    Pain Relieving Factors meds, siting                             [facility] Adult PT Treatment/Exercise - 02/21/21 0001      Ambulation/Gait   Ambulation/Gait Yes    Assistive device Rolling walker    Gait Comments 4: 42 first round; 4 min 14 sec second trip   A few very short stops to catch breath: O2 and HR checked post and were in appropriate levels:     Knee/Hip Exercises: Seated   Clamshell with TheraBand --   green loop 20x, Vc to eyeball LTLE to make sure it moves   Other Seated Knee/Hip Exercises toe raises/ankle DF x 15 reps bil                    PT Short Term Goals - 02/05/21 0958      PT SHORT TERM GOAL #1   Title Pt will be ind with initial HEP    Status Achieved      PT SHORT TERM GOAL #2   Title Pt will be able to participate in 3 min walk test with short breaks as needed    Baseline 3:00 x 298 feet no breaks    Status Achieved      PT SHORT TERM GOAL #3   Title Pt will demo 5x sit to stand using UEs and AD as needed in </= 28 sec    Status Achieved      PT SHORT TERM GOAL #4   Title Pt will reduce TUG time to </= 28 sec    Status Achieved             PT Long Term Goals - 02/05/21 0952      PT LONG TERM GOAL #1   Title Pt will improve LE strength to at least 4/5 bil for improved ease with transfers in/out  of car, bed and chair    Status On-going      PT LONG TERM GOAL #2    Title Pt will be able to particpate in 6 min walk test with AD and short breaks as needed to demo improved gait endurance.    Baseline walked just beyond 3' walk today to complete a lap, working toward 4'    Status On-going      PT [place] #3   Title Pt will demo reduced fall risk by reducing both TUG time and 5x sit to stand with no AD or least restrictive AD to </= 24 sec.    Baseline 17 sec TUG, previously met for 5x sit to stand    Status Achieved      PT LONG TERM GOAL #4   Title Pt will tolerate light household standing tasks up to 15 min for improved meal prep and light cleaning.    Baseline pain limits 1-5' due to no pain med use when at home, takes seated breaks when cooking    Status On-going      PT LONG TERM GOAL #5   Title Pt will report improved confidence in balance and stability with short distance community ambulation by at least 50%.    Baseline Pt realizes she is doing things she didn't try before but can't but a % to it    Status On-going                 Plan - 02/21/21 0848    Clinical Impression Statement Pt ambulated 2 different times toay with her RW. Both bouts were over 4 min and a combination of > 9 min. Leg pain/back pain was not limiting but her breathing was. Pt verbally states she is interested in doing water exercises in hopes of less back pain limiting her when standing. Pt has also changed her walking at home to do 1 lap but 3x day. This is much better onher back she reports. Pt's o2 saturation was steady at 99% throughout session: HR rest wa 65-68BPM, increasing to 95-97 after 4 min walk.    Personal Factors and Comorbidities Age;Comorbidity 1;Comorbidity 2;Comorbidity 3+    Comorbidities spinal stenosis, Hx of THR/TKR on Lt, Hx of bil PE, cardiovascular deconditioning    Examination-Activity Limitations Locomotion Level;Transfers;Bed Mobility;Bend;Squat;Dressing;Stairs;Stand    Examination-Participation Restrictions Meal Prep;Community  Activity;Driving;Laundry;Shop;Cleaning    Stability/Clinical Decision Making Evolving/Moderate complexity    Rehab Potential Good    PT Frequency 2x / week    PT Duration 12 weeks    PT Treatment/Interventions ADLs/Self Care Home Management;Aquatic Therapy;Electrical Stimulation;Moist Heat;DME Instruction;Functional mobility training;Gait training;Therapeutic activities;Therapeutic exercise;Balance training;Neuromuscular re-education;Manual techniques;Patient/family education;Passive range of motion;Joint Manipulations;Spinal Manipulations;Taping    PT Next Visit Plan ERO visit next. Pt is open to doing water exercise. Her sister already goes to the [facility] and said she would do the water class with her.    PT Home Exercise Plan Access Code: [email or web address]    Consulted and Agree with Plan of Care Patient           Patient will benefit from skilled therapeutic intervention in order to improve the following deficits and impairments:  Abnormal gait,Decreased range of motion,Difficulty walking,Cardiopulmonary status limiting activity,Decreased endurance,Pain,Decreased activity tolerance,Decreased balance,Impaired flexibility,Hypomobility,Improper body mechanics,Postural dysfunction,Decreased strength,Decreased mobility  Visit Diagnosis: Muscle weakness (generalized)  Chronic midline low back pain without sciatica  Abnormal posture  Other abnormalities of gait and mobility  Cervicalgia     Problem List Patient Active Problem  List   Diagnosis Date Noted  . Obstructive sleep apnea 03/09/2019  . Aortic regurgitation 12/02/2018  . History of pulmonary embolism 08/20/2018  . DOE (dyspnea on exertion)   . 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  . Bilateral ovarian cysts   . Hx of gallstones   . Hernia, hiatal   . Renal cysts, acquired, bilateral   . GERD (gastroesophageal reflux disease)   . Diverticulosis   . Lumbar spinal stenosis   . Diabetes mellitus without complication  ([place])   . Left knee DJD   . Rapid palpitations 12/06/2013  . Morbid obesity ([place]) 12/06/2013  . Fatigue 12/06/2013  . PVC (premature ventricular contraction) 12/06/2013  . Essential hypertension   . Hypothyroidism   . Hyperlipidemia LDL goal <100     Issabelle Mcraney, PTA 02/21/2021, 9:29 AM  [facility] [address] [place], [place], [address] Phone: [phone]   Fax:  [phone]  Name: Shalon Salado MRN: [id number] Date of Birth: 16-Aug-1946

## 2021-02-22 NOTE — Telephone Encounter (Signed)
The patient has been notified of the result MONITOR AND ECHO and verbalized understanding.  All questions (if any) were answered. Patient states she is still short of breath all the time.   RN informed patient will discuss in more details at your appointment 03/23/21. The  Notes did note address shortness of breath.  Raiford Simmonds, RN 02/22/2021 9:20 AM

## 2021-02-22 NOTE — Telephone Encounter (Signed)
See result see result notes  Glenetta Hew, MD

## 2021-02-23 ENCOUNTER — Encounter: Payer: Self-pay | Admitting: Physical Therapy

## 2021-02-23 ENCOUNTER — Ambulatory Visit: Payer: Medicare HMO | Attending: Family Medicine | Admitting: Physical Therapy

## 2021-02-23 ENCOUNTER — Other Ambulatory Visit: Payer: Self-pay

## 2021-02-23 DIAGNOSIS — M545 Low back pain, unspecified: Secondary | ICD-10-CM | POA: Diagnosis present

## 2021-02-23 DIAGNOSIS — M542 Cervicalgia: Secondary | ICD-10-CM

## 2021-02-23 DIAGNOSIS — M6281 Muscle weakness (generalized): Secondary | ICD-10-CM

## 2021-02-23 DIAGNOSIS — R293 Abnormal posture: Secondary | ICD-10-CM | POA: Diagnosis present

## 2021-02-23 DIAGNOSIS — R2689 Other abnormalities of gait and mobility: Secondary | ICD-10-CM | POA: Diagnosis present

## 2021-02-23 DIAGNOSIS — G8929 Other chronic pain: Secondary | ICD-10-CM

## 2021-02-23 NOTE — Therapy (Signed)
[facility] [address] [place], [place], [address] Phone: [phone]   Fax:  [phone]  Physical Therapy Treatment  Patient Details  Name: Tricia Harrison MRN: [id number] Date of Birth: 12/01/1945 Referring Provider (PT): Lujean Amel, MD   Encounter Date: 02/23/2021   PT End of Session - 02/23/21 0936    Visit Number 17    Date for PT Re-Evaluation 02/26/21    Authorization Type [employer] Medicare    Progress Note Due on Visit 20    PT Start Time 0930    PT Stop Time 1009    PT Time Calculation (min) 39 min    Activity Tolerance Patient tolerated treatment well    Behavior During Therapy [facility] for tasks assessed/performed           Past Medical History:  Diagnosis Date  . Anemia    as a child  . Asthma    as a child  . Chronic back pain    spinal stenosis and buldging disc;scoliosis  . Chronic constipation    occasionally takes something OTC  . Diabetes mellitus without complication ([place])    per pt "Pre" for 20+yrs  . Diverticulosis   . Gallstones    has known about this for 3-61yr  . GERD (gastroesophageal reflux disease)    takes Omeprazole daily  . H/O hiatal hernia   . Hemorrhoids   . History of blood transfusion    no abnormal reaction noted  . History of bronchitis    states its been a long time ago  . History of gout   . HLD (hyperlipidemia)    takes Fenofibrate daily  . HTN (hypertension)    takes Diltiazem and Ramipril daily  . Hypothyroidism    takes Synthroid daily  . Left knee DJD   . Nocturia   . Palpitations    started in 2013 and only occasionally;last time noticed about 2-3wks ago and after drinking caffeine  . PONV (postoperative nausea and vomiting)   . 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    Past Surgical History:  Procedure Laterality  Date  . COLONOSCOPY    . CT ANGIOGRAM OF CHEST - PE PROTOCOL  07/2018   Bilateral nonocclusive pulmonary emboli evidence of right heart strain consistent with "submassive "/intermediate PE.  -->  Code PE called.  .Marland KitchenDILATION AND CURETTAGE OF UTERUS     multiple about 6-7 times  . ESOPHAGOGASTRODUODENOSCOPY    . NM VQ LUNG SCAN ([facility])  11/23/2018   Low probability for PE  . RIGHT/LEFT HEART CATH AND CORONARY ANGIOGRAPHY N/A 08/19/2018   Procedure: RIGHT/LEFT HEART CATH AND CORONARY ANGIOGRAPHY;  Surgeon: VJettie Booze MD;  [facility];  Service: Cardiovascular:: Nonobstructive CAD.  Mild pulmonary pretension.  Mean PA pressure 26 mmHg with PA P 48/20 mmHg.  Normal LVEDP.  . THYROID SURGERY Right 2010  . TOTAL HIP ARTHROPLASTY Left 2005  . TOTAL KNEE ARTHROPLASTY Left 02/21/2014   Procedure: TOTAL KNEE ARTHROPLASTY;  Surgeon: RLorn Junes MD;  [facility]  Service: Orthopedics;  Laterality: Left;  . TRANSTHORACIC ECHOCARDIOGRAM  11/2019   Normal EF 60 to 65%.  Moderate LVH-GR one DD.  R WMA.  Mild aortic sclerosis with trivial AI.  Grossly normal pulmonary valve) with no significant dilatation.  Normal RV  and RA size.  Normal RV function.  Normal PA pressures.  . TRANSTHORACIC ECHOCARDIOGRAM  07/2018    (In setting of acute PE) mild LVH.  EF 55 to 60%.  GR 1 DD.  Aortic sclerosis but no stenosis.  Mild regurgitation.  Mild RA dilation.  Moderate LA dilation.  Moderate tricuspid regurgitation with severe primary hypertension estimated PA pressure 71 mmHg.  RV poorly visualized  . TRANSTHORACIC ECHOCARDIOGRAM  11/16/2018   EF 60-65%.  GR 1 DD.  Focal basal hypertrophy.  Moderate aortic regurgitation.  No stenosis.  Mild LA dilation.  Normal RV size and function.  Moderate PI but mild TR.  Peak PA pressure is now estimated 38 mmHg.  Marland Kitchen VAGINAL HYSTERECTOMY  1979    There were no vitals filed for this visit.   Subjective Assessment - 02/23/21 0938    Subjective  Saw PA yesterday regarding her heart health. She will need to go on medications that will help and MD suggested she walk for exercise. "I feel better today."    Pertinent History Lt THR 2005, Lt TKR 2015, spinal stenosis, scoliosis, DM, HLD, HTN, Hx of PE    Currently in Pain? No/denies   No pain today ( at this moment)                            [facility] Adult PT Treatment/Exercise - 02/23/21 0001      Ambulation/Gait   Ambulation/Gait Yes    Assistive device Rolling walker    Gait Comments 3 min 20 sec with RW: 4 min 12 sec second with RW   Pt performed all ambulation AFTER the Nustep today.     Lumbar Exercises: Aerobic   Nustep L4 x 10 min      Knee/Hip Exercises: Seated   Clamshell with [employer]ra[employer] --   yellow loop 2x15                   PT Short Term Goals - 02/05/21 0958      PT SHORT TERM GOAL #1   Title Pt will be ind with initial HEP    Status Achieved      PT SHORT TERM GOAL #2   Title Pt will be able to participate in 3 min walk test with short breaks as needed    Baseline 3:00 x 298 feet no breaks    Status Achieved      PT SHORT TERM GOAL #3   Title Pt will demo 5x sit to stand using UEs and AD as needed in </= 28 sec    Status Achieved      PT SHORT TERM GOAL #4   Title Pt will reduce TUG time to </= 28 sec    Status Achieved             PT Long Term Goals - 02/05/21 0952      PT LONG TERM GOAL #1   Title Pt will improve LE strength to at least 4/5 bil for improved ease with transfers in/out of car, bed and chair    Status On-going      PT LONG TERM GOAL #2   Title Pt will be able to particpate in 6 min walk test with AD and short breaks as needed to demo improved gait endurance.    Baseline walked just beyond 3' walk today to complete a lap, working toward 4'    Status On-going  PT LONG TERM GOAL #3   Title Pt will demo reduced fall risk by reducing both TUG time and 5x sit to stand with no AD or least restrictive AD  to </= 24 sec.    Baseline 17 sec TUG, previously met for 5x sit to stand    Status Achieved      PT LONG TERM GOAL #4   Title Pt will tolerate light household standing tasks up to 15 min for improved meal prep and light cleaning.    Baseline pain limits 1-5' due to no pain med use when at home, takes seated breaks when cooking    Status On-going      PT LONG TERM GOAL #5   Title Pt will report improved confidence in balance and stability with short distance community ambulation by at least 50%.    Baseline Pt realizes she is doing things she didn't try before but can't but a % to it    Status On-going                 Plan - 02/23/21 4097    Clinical Impression Statement Pt able to perform the Nustep today and THEN ambulate at least 3 min 2 different bouts.    Personal Factors and Comorbidities Age;Comorbidity 1;Comorbidity 2;Comorbidity 3+    Comorbidities spinal stenosis, Hx of THR/TKR on Lt, Hx of bil PE, cardiovascular deconditioning    Examination-Activity Limitations Locomotion Level;Transfers;Bed Mobility;Bend;Squat;Dressing;Stairs;Stand    Examination-Participation Restrictions Meal Prep;Community Activity;Driving;Laundry;Shop;Cleaning    Stability/Clinical Decision Making Evolving/Moderate complexity    Rehab Potential Good    PT Frequency 2x / week    PT Duration 12 weeks    PT Treatment/Interventions ADLs/Self Care Home Management;Aquatic Therapy;Electrical Stimulation;Moist Heat;DME Instruction;Functional mobility training;Gait training;Therapeutic activities;Therapeutic exercise;Balance training;Neuromuscular re-education;Manual techniques;Patient/family education;Passive range of motion;Joint Manipulations;Spinal Manipulations;Taping    PT Next Visit Plan ERO visit next. Pt is open to doing water exercise. Her sister already goes to the [facility] and said she would do the water class with her.    PT Home Exercise Plan Access Code: [email or web address]    Consulted and Agree with Plan  of Care Patient           Patient will benefit from skilled therapeutic intervention in order to improve the following deficits and impairments:  Abnormal gait,Decreased range of motion,Difficulty walking,Cardiopulmonary status limiting activity,Decreased endurance,Pain,Decreased activity tolerance,Decreased balance,Impaired flexibility,Hypomobility,Improper body mechanics,Postural dysfunction,Decreased strength,Decreased mobility  Visit Diagnosis: Muscle weakness (generalized)  Chronic midline low back pain without sciatica  Abnormal posture  Other abnormalities of gait and mobility  Cervicalgia     Problem List Patient Active Problem List   Diagnosis Date Noted  . Obstructive sleep apnea 03/09/2019  . Aortic regurgitation 12/02/2018  . History of pulmonary embolism 08/20/2018  . DOE (dyspnea on exertion)   . 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  . Bilateral ovarian cysts   . Hx of gallstones   . Hernia, hiatal   . Renal cysts, acquired, bilateral   . GERD (gastroesophageal reflux disease)   . Diverticulosis   . Lumbar spinal stenosis   . Diabetes mellitus without complication ([place])   . Left knee DJD   . Rapid palpitations 12/06/2013  . Morbid obesity ([place]) 12/06/2013  . Fatigue 12/06/2013  . PVC (premature ventricular contraction) 12/06/2013  . Essential hypertension   . Hypothyroidism   . Hyperlipidemia LDL goal <100     Litisha Guagliardo, PTA 02/23/2021, 10:06 AM  [facility] [address]  [place], [place], [place], [address] Phone: [phone]   Fax:  [phone]  Name: Tricia Harrison MRN: [id number] Date of Birth: 1946/04/14

## 2021-02-26 ENCOUNTER — Ambulatory Visit: Payer: Medicare HMO | Admitting: Physical Therapy

## 2021-02-26 ENCOUNTER — Other Ambulatory Visit: Payer: Self-pay

## 2021-02-26 ENCOUNTER — Encounter: Payer: Self-pay | Admitting: Physical Therapy

## 2021-02-26 DIAGNOSIS — M6281 Muscle weakness (generalized): Secondary | ICD-10-CM

## 2021-02-26 DIAGNOSIS — R2689 Other abnormalities of gait and mobility: Secondary | ICD-10-CM

## 2021-02-26 DIAGNOSIS — G8929 Other chronic pain: Secondary | ICD-10-CM

## 2021-02-26 DIAGNOSIS — R293 Abnormal posture: Secondary | ICD-10-CM

## 2021-02-26 NOTE — Therapy (Signed)
[facility] [address] [place], [place], [address] Phone: [phone]   Fax:  [phone]  Physical Therapy Treatment  Patient Details  Name: Tricia Harrison MRN: [id number] Date of Birth: 1946/04/05 Referring Provider (PT): Lujean Amel, MD   Encounter Date: 02/26/2021   PT End of Session - 02/26/21 0940    Visit Number 18    Date for PT Re-Evaluation 02/26/21    Authorization Type Aetna Medicare    Progress Note Due on Visit 20    PT Start Time 0930    PT Stop Time 1010    PT Time Calculation (min) 40 min    Activity Tolerance Patient tolerated treatment well    Behavior During Therapy [facility] for tasks assessed/performed           Past Medical History:  Diagnosis Date  . Anemia    as a child  . Asthma    as a child  . Chronic back pain    spinal stenosis and buldging disc;scoliosis  . Chronic constipation    occasionally takes something OTC  . Diabetes mellitus without complication ([place])    per pt "Pre" for 20+yrs  . Diverticulosis   . Gallstones    has known about this for 3-74yrs  . GERD (gastroesophageal reflux disease)    takes Omeprazole daily  . H/O hiatal hernia   . Hemorrhoids   . History of blood transfusion    no abnormal reaction noted  . History of bronchitis    states its been a long time ago  . History of gout   . HLD (hyperlipidemia)    takes Fenofibrate daily  . HTN (hypertension)    takes Diltiazem and Ramipril daily  . Hypothyroidism    takes Synthroid daily  . Left knee DJD   . Nocturia   . Palpitations    started in 2013 and only occasionally;last time noticed about 2-3wks ago and after drinking caffeine  . PONV (postoperative nausea and vomiting)   . 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    Past Surgical History:  Procedure Laterality  Date  . COLONOSCOPY    . CT ANGIOGRAM OF CHEST - PE PROTOCOL  07/2018   Bilateral nonocclusive pulmonary emboli evidence of right heart strain consistent with "submassive "/intermediate PE.  -->  Code PE called.  Marland Kitchen DILATION AND CURETTAGE OF UTERUS     multiple about 6-7 times  . ESOPHAGOGASTRODUODENOSCOPY    . NM VQ LUNG SCAN ([place] [facility])  11/23/2018   Low probability for PE  . RIGHT/LEFT HEART CATH AND CORONARY ANGIOGRAPHY N/A 08/19/2018   Procedure: RIGHT/LEFT HEART CATH AND CORONARY ANGIOGRAPHY;  Surgeon: Jettie Booze, MD;  Location: [place] [facility];  Service: Cardiovascular:: Nonobstructive CAD.  Mild pulmonary pretension.  Mean PA pressure 26 mmHg with PA P 48/20 mmHg.  Normal LVEDP.  . THYROID SURGERY Right 2010  . TOTAL HIP ARTHROPLASTY Left 2005  . TOTAL KNEE ARTHROPLASTY Left 02/21/2014   Procedure: TOTAL KNEE ARTHROPLASTY;  Surgeon: Lorn Junes, MD;  Location: [place];  Service: Orthopedics;  Laterality: Left;  . TRANSTHORACIC ECHOCARDIOGRAM  11/2019   Normal EF 60 to 65%.  Moderate LVH-GR one DD.  R WMA.  Mild aortic sclerosis with trivial AI.  Grossly normal pulmonary valve) with no significant dilatation.  Normal RV  and RA size.  Normal RV function.  Normal PA pressures.  . TRANSTHORACIC ECHOCARDIOGRAM  07/2018    (In setting of acute PE) mild LVH.  EF 55 to 60%.  GR 1 DD.  Aortic sclerosis but no stenosis.  Mild regurgitation.  Mild RA dilation.  Moderate LA dilation.  Moderate tricuspid regurgitation with severe primary hypertension estimated PA pressure 71 mmHg.  RV poorly visualized  . TRANSTHORACIC ECHOCARDIOGRAM  11/16/2018   EF 60-65%.  GR 1 DD.  Focal basal hypertrophy.  Moderate aortic regurgitation.  No stenosis.  Mild LA dilation.  Normal RV size and function.  Moderate PI but mild TR.  Peak PA pressure is now estimated 38 mmHg.  Marland Kitchen VAGINAL HYSTERECTOMY  1979    There were no vitals filed for this visit.   Subjective Assessment - 02/26/21 0936    Subjective I  overdid it doing 20 min on the bike at home.  My legs were fine but my back hurt so much later in the day I couldn't stand up.    Pertinent History Lt THR 2005, Lt TKR 2015, spinal stenosis, scoliosis, DM, HLD, HTN, Hx of PE    How long can you stand comfortably? 2 min    How long can you walk comfortably? household distances, very fatiguing    Patient Stated Goals stand up and walk by myself, no water therapy while it's cold    Currently in Pain? Yes    Pain Score 7     Pain Location Back    Pain Orientation Lower    Pain Type Chronic pain    Pain Onset More than a month ago    Pain Frequency Intermittent    Aggravating Factors  stand, walk    Pain Relieving Factors meds, sitting              [facility] PT Assessment - 02/26/21 0001      Assessment   Medical Diagnosis M41.9 (ICD-10-CM) - Scoliosis of cervical spine, unspecified scoliosis type, R29.898 (ICD-10-CM) - Leg weakness, bilateral    Referring Provider (PT) Koirala, Dibas, MD    Onset Date/Surgical Date --   10-15 years ago for back   Hand Dominance Right    Next MD Visit as needed    Prior Therapy yes for back, knee, did aquatic therapy in past      Transfers   Five time sit to stand comments  18 sec      Ambulation/Gait   Ambulation/Gait Yes    Assistive device Rolling walker    Gait Comments 3 min 34 sec x 260', brief seated break, then amb to finish 7 min      Timed Up and Go Test   TUG Normal TUG    Normal TUG (seconds) 17    TUG Comments 17 sec with RW, 14 sec no walker close supervision, used light UE assist with UEs for sit to stand                         [facility] Adult PT Treatment/Exercise - 02/26/21 0001      Self-Care   Self-Care Other Self-Care Comments    Other Self-Care Comments  HEP review and "use it or lose it" discussion for continuing short distance amb at home upon d/c      Exercises   Exercises Other Exercises    Other Exercises  review of HEP and reiteration of more frequent  short duration amb w/  RW and chair bike to prevent pain with ther ex                    PT Short Term Goals - 02/26/21 0941      PT SHORT TERM GOAL #1   Title Pt will be ind with initial HEP    Status Achieved      PT SHORT TERM GOAL #2   Title Pt will be able to participate in 3 min walk test with short breaks as needed    Status Achieved      PT SHORT TERM GOAL #3   Title Pt will demo 5x sit to stand using UEs and AD as needed in </= 28 sec    Baseline 22 sec 01/01/21    Status Achieved      PT SHORT TERM GOAL #4   Title Pt will reduce TUG time to </= 28 sec    Baseline 21 sec 01/01/21    Status Achieved    Target Date 01/01/21             PT Long Term Goals - 02/26/21 0942      PT LONG TERM GOAL #1   Title Pt will improve LE strength to at least 4/5 bil for improved ease with transfers in/out of car, bed and chair    Status Achieved      PT LONG TERM GOAL #2   Title Pt will be able to particpate in 6 min walk test with AD and short breaks as needed to demo improved gait endurance.    Baseline with single short seated rest    Status Achieved      PT LONG TERM GOAL #3   Title Pt will demo reduced fall risk by reducing both TUG time and 5x sit to stand with no AD or least restrictive AD to </= 24 sec.    Baseline 17 sec TUG, previously met for 5x sit to stand    Status Achieved      PT LONG TERM GOAL #4   Title Pt will tolerate light household standing tasks up to 15 min for improved meal prep and light cleaning.    Baseline limited to 2'    Status Partially Met      PT LONG TERM GOAL #5   Title Pt will report improved confidence in balance and stability with short distance community ambulation by at least 50%.    Baseline -    Status Achieved                 Plan - 02/26/21 0949    Clinical Impression Statement Pt has met all STGs and most LTGs.  She is very pleased with her progress and reports much imporved confidence in balance and  strength/mobility for funtional tasks.  She is able to ambulate with improved posture and imporved cardiovascular endurance with RW for up to 8 min with single seated break.  She has significantly reduced time for 5x sit to stand and TUG test, demo'ing less fall risk and improved functional mobility.  She continues to be limited by back pain for standing within 2 min.  She is able to use chair bike at home and has info to seek aquatic ther ex classes at the [facility].  She has learned the benefit of more frequent short duration household ambulation to work on endurance and strength.  She is ind with HEP and motivated by upcoming travels to keep up her  gains from PT.  She is ready for d/c at this time.    Comorbidities spinal stenosis, Hx of THR/TKR on Lt, Hx of bil PE, cardiovascular deconditioning    Rehab Potential Good    PT Frequency 2x / week    PT Duration 12 weeks    PT Treatment/Interventions ADLs/Self Care Home Management;Aquatic Therapy;Electrical Stimulation;Moist Heat;DME Instruction;Functional mobility training;Gait training;Therapeutic activities;Therapeutic exercise;Balance training;Neuromuscular re-education;Manual techniques;Patient/family education;Passive range of motion;Joint Manipulations;Spinal Manipulations;Taping    PT Next Visit Plan d/c to HEP    PT Home Exercise Plan Access Code: [email or web address]    Consulted and Agree with Plan of Care Patient           Patient will benefit from skilled therapeutic intervention in order to improve the following deficits and impairments:     Visit Diagnosis: Muscle weakness (generalized)  Chronic midline low back pain without sciatica  Abnormal posture  Other abnormalities of gait and mobility     Problem List Patient Active Problem List   Diagnosis Date Noted  . Obstructive sleep apnea 03/09/2019  . Aortic regurgitation 12/02/2018  . History of pulmonary embolism 08/20/2018  . DOE (dyspnea on exertion)   . DJD (degenerative joint  disease) of knee 02/21/2014  . Bilateral ovarian cysts   . Hx of gallstones   . Hernia, hiatal   . Renal cysts, acquired, bilateral   . GERD (gastroesophageal reflux disease)   . Diverticulosis   . Lumbar spinal stenosis   . Diabetes mellitus without complication ([place])   . Left knee DJD   . Rapid palpitations 12/06/2013  . Morbid obesity ([place]) 12/06/2013  . Fatigue 12/06/2013  . PVC (premature ventricular contraction) 12/06/2013  . Essential hypertension   . Hypothyroidism   . Hyperlipidemia LDL goal <100     PHYSICAL THERAPY DISCHARGE SUMMARY  Visits from Start of Care: 18  Current functional level related to goals / functional outcomes: See above   Remaining deficits: See above    Education / Equipment: HEP  Plan: Patient agrees to discharge.  Patient goals were met. Patient is being discharged due to meeting the stated rehab goals.  ?????         Baruch Merl, PT 02/26/21 10:12 AM    [facility] [address], [place], [place], [address] Phone: [phone]   Fax:  [phone]  Name: Akeiba Axelson MRN: [id number] Date of Birth: September 05, 1946

## 2021-03-23 ENCOUNTER — Telehealth: Payer: Self-pay | Admitting: *Deleted

## 2021-03-23 ENCOUNTER — Encounter: Payer: Self-pay | Admitting: Cardiology

## 2021-03-23 ENCOUNTER — Telehealth (INDEPENDENT_AMBULATORY_CARE_PROVIDER_SITE_OTHER): Payer: Medicare HMO | Admitting: Cardiology

## 2021-03-23 VITALS — BP 135/78 | Ht 61.0 in | Wt 170.0 lb

## 2021-03-23 DIAGNOSIS — E785 Hyperlipidemia, unspecified: Secondary | ICD-10-CM

## 2021-03-23 DIAGNOSIS — I10 Essential (primary) hypertension: Secondary | ICD-10-CM

## 2021-03-23 DIAGNOSIS — I351 Nonrheumatic aortic (valve) insufficiency: Secondary | ICD-10-CM | POA: Diagnosis not present

## 2021-03-23 DIAGNOSIS — I493 Ventricular premature depolarization: Secondary | ICD-10-CM | POA: Diagnosis not present

## 2021-03-23 DIAGNOSIS — Z86711 Personal history of pulmonary embolism: Secondary | ICD-10-CM

## 2021-03-23 NOTE — Patient Instructions (Signed)
Medication Instructions:  I think you are okay to take your current medications.  Fluctuations in blood pressure or normal.  As long as you stable without blood pressures being over 140s-150s, we are okay.  *If you need a refill on your cardiac medications before your next appointment, please call your pharmacy*   Lab Work: None    Testing/Procedures: none   Follow-Up: At [facility], you and your health needs are our priority.  As part of our continuing mission to provide you with exceptional heart care, we have created designated Provider Care Teams.  These Care Teams include your primary Cardiologist (physician) and Advanced Practice Providers (APPs -  Physician Assistants and Nurse Practitioners) who all work together to provide you with the care you need, when you need it.    Your next appointment:   6 month(s)  The format for your next appointment:   In Person  Provider:   You may see Glenetta Hew, MD or one of the following Advanced Practice Providers on your designated Care Team:    Rosaria Ferries, PA-C  Jory Sims, DNP, ANP    Other Instructions Keep staying active.  I think that help your blood pressure.  Avoid salt.

## 2021-03-23 NOTE — Progress Notes (Signed)
Virtual Visit via Telephone Note   This visit type was conducted due to national recommendations for restrictions regarding the COVID-19 Pandemic (e.g. social distancing) in an effort to limit this patient's exposure and mitigate transmission in our community.  Due to her co-morbid illnesses, this patient is at least at moderate risk for complications without adequate follow up.  This format is felt to be most appropriate for this patient at this time.  The patient did not have access to video technology/had technical difficulties with video requiring transitioning to audio format only (telephone).  All issues noted in this document were discussed and addressed.  No physical exam could be performed with this format.  Please refer to the patient's chart for her  consent to telehealth for [facility].   Patient has given verbal permission to conduct this visit via virtual appointment and to bill insurance 04/11/2021 1:04 AM     Evaluation Performed:  Follow-up visit  Date:  04/11/2021   ID:  Tricia Harrison, DOB 02/28/1946, MRN [id number]  Patient Location: Home Provider Location: Home Office  PCP:  Lujean Amel, MD  Cardiologist:  Glenetta Hew, MD  Electrophysiologist:  None   Chief Complaint:   Chief Complaint  Patient presents with  . Follow-up    Test results-echo and monitor    ====================================  ASSESSMENT & PLAN:    Problem List Items Addressed This Visit    History of pulmonary embolism (Chronic)    Presumably still on Eliquis.  Plan is to be on Eliquis lifelong.  It is actually this is being discontinued which may be because is held for procedures..      Essential hypertension (Chronic)    Blood pressures have been going up and down.  She says they do fluctuate quite a bit.  Currently borderline pressures.  I asked her to monitor pressures.  Goal is for average < 135/85 mmHg, but it is acceptable to have occasional readings that are elevated.   For now, she is on low-dose ramipril along with carvedilol and diltiazem.  Potential options for titrating up will either be carvedilol or metoprolol depending on pressures.  I asked that she monitor pressures.      Hyperlipidemia LDL goal <100 (Chronic)    Currently on 20 mg of simvastatin along with fenofibrate.  Labs by recent check seem to be pretty well controlled with an LDL of 81.  She will try a statin holiday for fatigue symptoms evaluation and did not notice any change.  Now back on statin.      Morbid obesity ([place]) (Chronic)    Has continued weight loss.  13 more pounds noted in the last month and a half.  I congratulated her efforts.  She has been doing well with physical therapy, and actually recently graduated from it.  She is very happy with her weight loss.  She is also really trying to change her diet.      Aortic regurgitation (Chronic)    Most recent echo now shows mild to moderate aortic insufficiency.  No stenosis. Unless her murmur or symptoms worsen, would probably hold off on rechecking echocardiogram for a couple years.      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  She did not notice to show atrial runs.  Structurally normal heart on echo.  Relatively benign monitor.  Continue combination of diltiazem and carvedilol.  Overall  symptoms seem to be better.           ====================================  History of Present Illness:    Tricia Harrison is a 75 y.o. female with PMH notable for history of submassive PE, bilateral lower extremity edema and hypertension who presents via audio/video conferencing for a telehealth visit today as a 6-wk follow-up after being seen in March for progressive exertional dyspnea and palpitations  Tricia Harrison was last seen January 24, 2020-noted worsening exertional dyspnea.  BP controlled.  Was on appropriate  meds.  Ordered 2D echo, and event monitor.Marland Kitchen  Hospitalizations:  . None   Recent - Interim CV studies:   The following studies were reviewed today: . Echo 02/20/2021: EF 55%.  Normal LV function.  Mild LVH.  GR 1 DD.  Normal PA pressures.  Severe LA dilation.  (Consistent with more significant diastolic dysfunction).  Mild-possibly mild to moderate aortic insufficiency.  Borderline elevated RAP.  Trivial pericardial effusion.  (Stable)  3-day Zio patch event monitor (3/4-01/29/2021): Mostly normal monitor.  Predominantly sinus rhythm (rate range 48-105 bpm, average 67 bpm) 9 short atrial runs (fastest = 13 beats rate 135 bpm, longest ~17 seconds-101 bpm) -> not noted on symptom log.  No sustained arrhythmias (tachycardia or bradycardia), or pauses..  Symptoms are noted with PVCs.   Predominant rhythm is normal sinus rhythm: Minimum rate 48 bpm, maximum 105 bpm with an average of 67 bpm.  Rare PACs with occasional ~1% PVCs. No real couplets or triplets. Very short burst of trigeminy.  9 Atrial Runs: Fastest was 13 beats at a rate of 135 bpm, longest was just under 17 seconds with a rate of 101 beats. -> These were not noted on symptoms  Symptoms noted with sinus rhythm and PVCs.  No abnormal tachycardic or bradycardic rhythms.  No pauses.  Inerval History   Tricia Harrison unfortunate was unable to connect using video conferencing.  We therefore Telephone call.  See is overall doing fairly well.  Very happy with her weight loss.  She has been discharged from physical therapy and trying to keep up her level exercise.  She has also made adjustments to her diet.  Pretty significant weight loss in the last month and a half.  With that, her exertional dyspnea is improving, still present.  She is still quite deconditioned.  She still has her palpitations off and on with exertion, but not overly worrisome.  Her shortness of breath has stabilized with weight loss.  Cardiovascular ROS:  positive for - dyspnea on exertion, orthopnea, palpitations and Orthopnea seems more like related to obesity negative for - chest pain, edema, paroxysmal nocturnal dyspnea, rapid heart rate, shortness of breath or Syncope or near syncope, TIA/amaurosis fugax or claudication.  ROS:  Please see the history of present illness.    Review of Systems  Constitutional: Positive for malaise/fatigue (Energy level does seem to be getting better) and weight loss (Continue to exercise after completion of physical therapy training.  Adjusted diet.).  HENT: Negative for congestion.   Eyes:       Her eye doctor is changing her medications that she is having some blurred vision issues.  Respiratory: Negative for cough and shortness of breath.   Cardiovascular: Positive for orthopnea (Probably more related to obesity.).  Gastrointestinal: Negative for blood in stool, heartburn and melena.  Genitourinary: Negative for hematuria.  Musculoskeletal: Positive for back pain, joint pain and myalgias.  Neurological: Positive for weakness (Generalized weakness, but getting better.). Negative  for dizziness and headaches.  Endo/Heme/Allergies: Does not bruise/bleed easily.  Psychiatric/Behavioral: Negative for depression (Seems a little more stable.).    Past Medical History:  Diagnosis Date  . Anemia    as a child  . Asthma    as a child  . Chronic back pain    spinal stenosis and buldging disc;scoliosis  . Chronic constipation    occasionally takes something OTC  . Diabetes mellitus without complication ([place])    per pt "Pre" for 20+yrs  . Diverticulosis   . Gallstones    has known about this for 3-64yrs  . GERD (gastroesophageal reflux disease)    takes Omeprazole daily  . H/O hiatal hernia   . Hemorrhoids   . History of blood transfusion    no abnormal reaction noted  . History of bronchitis    states its been a long time ago  . History of gout   . HLD (hyperlipidemia)    takes Fenofibrate daily  .  HTN (hypertension)    takes Diltiazem and Ramipril daily  . Hypothyroidism    takes Synthroid daily  . Left knee DJD   . Nocturia   . Palpitations    started in 2013 and only occasionally;last time noticed about 2-3wks ago and after drinking caffeine  . PONV (postoperative nausea and vomiting)   . 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   Past Surgical History:  Procedure Laterality Date  . 3-day EVENT MONITOR  01/2019   Mostly normal monitor.  Predominantly sinus rhythm (rate range 48-105 bpm, average 67 bpm) 9 short atrial runs (fastest = 13 beats rate 135 bpm, longest ~17 seconds-101 bpm) -> not noted on symptom log.  No sustained arrhythmias (tachycardia or bradycardia), or pauses..  Symptoms are noted with PVCs.  . COLONOSCOPY    . CT ANGIOGRAM OF CHEST - PE PROTOCOL  07/2018   Bilateral nonocclusive pulmonary emboli evidence of right heart strain consistent with "submassive "/intermediate PE.  -->  Code PE called.  Marland Kitchen DILATION AND CURETTAGE OF UTERUS     multiple about 6-7 times  . ESOPHAGOGASTRODUODENOSCOPY    . NM VQ LUNG SCAN ([place] [facility])  11/23/2018   Low probability for PE  . RIGHT/LEFT HEART CATH AND CORONARY ANGIOGRAPHY N/A 08/19/2018   Procedure: RIGHT/LEFT HEART CATH AND CORONARY ANGIOGRAPHY;  Surgeon: Jettie Booze, MD;  Location: [facility];  Service: Cardiovascular:: Nonobstructive CAD.  Mild pulmonary pretension.  Mean PA pressure 26 mmHg with PA P 48/20 mmHg.  Normal LVEDP.  . THYROID SURGERY Right 2010  . TOTAL HIP ARTHROPLASTY Left 2005  . TOTAL KNEE ARTHROPLASTY Left 02/21/2014   Procedure: TOTAL KNEE ARTHROPLASTY;  Surgeon: Lorn Junes, MD;  Location: [facility];  Service: Orthopedics;  Laterality: Left;  . TRANSTHORACIC ECHOCARDIOGRAM  07/2018    (In setting of acute PE) mild LVH.  EF 55 to 60%.  GR 1 DD.  Aortic sclerosis but no  stenosis.  Mild regurgitation.  Mild RA dilation.  Moderate LA dilation.  Moderate tricuspid regurgitation with severe primary hypertension estimated PA pressure 71 mmHg.  RV poorly visualized  . TRANSTHORACIC ECHOCARDIOGRAM  10/2018 - 11/2019   a) EF 60-65%.  GR 1 DD.  Focal basal LVH. Mod AI/ no AS. Mild LA dilation.  Normal RV size and fxn.  Mod PI w/ mild TR.  Peak PAP ~ 38 mmHg; b) Normal EF 60 to 65%.  Moderate LVH-GR 1 DD.  R WMA.  Mild AS w/ trivial AI.  Grossly normal PA, RV and RA size.  Normal RV function.  Normal PA pressures.  . TRANSTHORACIC ECHOCARDIOGRAM  01/2021    EF 55%.  Normal LV function.  Mild LVH.  GR 1 DD.  Normal PA pressures.  Severe LA dilation.  (Consistent with more significant diastolic dysfunction).  Mild-possibly mild to moderate aortic insufficiency.  Borderline elevated RAP.  Trivial pericardial effusion.  (Stable)  . VAGINAL HYSTERECTOMY  1979     Current Meds  Medication Sig  . acetaminophen (TYLENOL) 500 MG tablet Take 1,000 mg by mouth every 6 (six) hours as needed for mild pain.  Marland Kitchen albuterol (VENTOLIN HFA) 108 (90 Base) MCG/ACT inhaler Inhale 2 puffs into the lungs every 6 (six) hours as needed.  Marland Kitchen azaTHIOprine (IMURAN) 50 MG tablet Take 100 mg by mouth daily.  . bimatoprost (LUMIGAN) 0.01 % SOLN Place 1 drop into both eyes at bedtime.  . carvedilol (COREG) 6.25 MG tablet TAKE 1 TABLET BY MOUTH TWO  TIMES DAILY WITH A MEAL  . Cholecalciferol (VITAMIN D3) 2000 UNITS TABS Take 2,000 Units by mouth daily.  Marland Kitchen diltiazem (CARDIZEM LA) 360 MG 24 hr tablet Take 360 mg by mouth daily.  . dorzolamide-timolol (COSOPT) 22.3-6.8 MG/ML ophthalmic solution Place 1 drop into both eyes 2 times daily.  . fenofibrate (TRICOR) 145 MG tablet Take 145 mg by mouth daily.  Marland Kitchen HUMIRA PEN 40 MG/0.4ML PNKT SMARTSIG:40 Milligram(s) SUB-Q Every 2 Weeks  . ketorolac (ACULAR) 0.4 % SOLN Apply 1 drop to eye 4 (four) times daily.  Marland Kitchen levothyroxine (SYNTHROID, LEVOTHROID) 50 MCG tablet Take  50 mcg by mouth daily before breakfast.  . omeprazole (PRILOSEC) 20 MG capsule Take 20 mg by mouth daily.  . polyethylene glycol (MIRALAX / GLYCOLAX) 17 g packet Take 17 g by mouth daily as needed for mild constipation.  . ramipril (ALTACE) 2.5 MG capsule Take 2.5 mg by mouth daily.  . rosuvastatin (CRESTOR) 20 MG tablet TAKE 1 TABLET BY MOUTH  DAILY AT 6 PM.  . [DISCONTINUED] ELIQUIS 5 MG TABS tablet TAKE 1 TABLET BY MOUTH  TWICE DAILY     Allergies:   Atorvastatin, Codeine, and Morphine and related   Social History   Tobacco Use  . Smoking status: Former Smoker    Packs/day: 0.50    Years: 20.00    Pack years: 10.00    Types: Cigarettes    Start date: 09/04/1964    Quit date: 11/25/1993    Years since quitting: 27.3  . Smokeless tobacco: Never Used  . Tobacco comment: quit smoking in 1995  Substance Use Topics  . Alcohol use: Yes    Comment: Socially.  . Drug use: No     Family Hx: The patient's family history includes Cirrhosis in her father; Diabetes in her mother and sister; Heart disease in her mother; Hypertension in her mother; Lymphoma in her sister; Pneumonia in her father; Uterine cancer in her mother. There is no history of Breast cancer.   Labs/Other Tests and Data Reviewed:    EKG:  No ECG reviewed.  Recent Labs: No results found for requested labs within last 8760 hours.   Recent Lipid Panel Lab Results  Component Value Date/Time   CHOL 190 10/08/2018 09:29 AM   TRIG 71 10/08/2018 09:29 AM   HDL 84 10/08/2018 09:29 AM   CHOLHDL 2.3  10/08/2018 09:29 AM   CHOLHDL 3.1 08/19/2018 06:38 AM   LDLCALC 92 10/08/2018 09:29 AM    Wt Readings from Last 3 Encounters:  03/23/21 170 lb (77.1 kg)  01/23/21 183 lb 12.8 oz (83.4 kg)  12/09/19 189 lb (85.7 kg)     Objective:    Vital Signs:  BP 135/78   Ht 5\' 1"  (1.549 m)   Wt 170 lb (77.1 kg)   BMI 32.12 kg/m   VITAL SIGNS:  reviewed Well nourished, well developed female in no acute distress. A&O x 3.   normal Mood & Affect Non-labored respirations  ==========================================  COVID-19 Education: The signs and symptoms of COVID-19 were discussed with the patient and how to seek care for testing (follow up with PCP or arrange E-visit).   The importance of social distancing was discussed today.  Time:   Today, I have spent 25 minutes with the patient with telehealth technology discussing the above problems.   An additional 10 minutes spent charting (reviewing prior notes, hospital records, studies, labs etc.) Total 86minutes   Medication Adjustments/Labs and Tests Ordered: Current medicines are reviewed at length with the patient today.  Concerns regarding medicines are outlined above.   Patient Instructions  Medication Instructions:  I think you are okay to take your current medications.  Fluctuations in blood pressure or normal.  As long as you stable without blood pressures being over 140s-150s, we are okay.  *If you need a refill on your cardiac medications before your next appointment, please call your pharmacy*   Lab Work: None    Testing/Procedures: none   Follow-Up: At [facility], you and your health needs are our priority.  As part of our continuing mission to provide you with exceptional heart care, we have created designated Provider Care Teams.  These Care Teams include your primary Cardiologist (physician) and Advanced Practice Providers (APPs -  Physician Assistants and Nurse Practitioners) who all work together to provide you with the care you need, when you need it.    Your next appointment:   6 month(s)  The format for your next appointment:   In Person  Provider:   You may see Glenetta Hew, MD or one of the following Advanced Practice Providers on your designated Care Team:    Rosaria Ferries, PA-C  Jory Sims, DNP, ANP    Other Instructions Keep staying active.  I think that help your blood pressure.  Avoid  salt.    Signed, Glenetta Hew, MD  04/11/2021 1:04 AM    Tricia Harrison

## 2021-03-23 NOTE — Telephone Encounter (Signed)
  Patient Consent for Virtual Visit         Tricia Harrison has provided verbal consent on 03/23/2021 for a virtual visit (video or telephone).   CONSENT FOR VIRTUAL VISIT FOR:  Tricia Harrison  By participating in this virtual visit I agree to the following:  I hereby voluntarily request, consent and authorize [facility] and its employed or contracted physicians, physician assistants, nurse practitioners or other licensed health care professionals (the Practitioner), to provide me with telemedicine health care services (the "Services") as deemed necessary by the treating Practitioner. I acknowledge and consent to receive the Services by the Practitioner via telemedicine. I understand that the telemedicine visit will involve communicating with the Practitioner through live audiovisual communication technology and the disclosure of certain medical information by electronic transmission. I acknowledge that I have been given the opportunity to request an in-person assessment or other available alternative prior to the telemedicine visit and am voluntarily participating in the telemedicine visit.  I understand that I have the right to withhold or withdraw my consent to the use of telemedicine in the course of my care at any time, without affecting my right to future care or treatment, and that the Practitioner or I may terminate the telemedicine visit at any time. I understand that I have the right to inspect all information obtained and/or recorded in the course of the telemedicine visit and may receive copies of available information for a reasonable fee.  I understand that some of the potential risks of receiving the Services via telemedicine include:  Marland Kitchen Delay or interruption in medical evaluation due to technological equipment failure or disruption; . Information transmitted may not be sufficient (e.g. poor resolution of images) to allow for appropriate medical decision making by the  Practitioner; and/or  . In rare instances, security protocols could fail, causing a breach of personal health information.  Furthermore, I acknowledge that it is my responsibility to provide information about my medical history, conditions and care that is complete and accurate to the best of my ability. I acknowledge that Practitioner's advice, recommendations, and/or decision may be based on factors not within their control, such as incomplete or inaccurate data provided by me or distortions of diagnostic images or specimens that may result from electronic transmissions. I understand that the practice of medicine is not an exact science and that Practitioner makes no warranties or guarantees regarding treatment outcomes. I acknowledge that a copy of this consent can be made available to me via my patient portal ([facility]), or I can request a printed copy by calling the office of [facility].    I understand that my insurance will be billed for this visit.   I have read or had this consent read to me. . I understand the contents of this consent, which adequately explains the benefits and risks of the Services being provided via telemedicine.  . I have been provided ample opportunity to ask questions regarding this consent and the Services and have had my questions answered to my satisfaction. . I give my informed consent for the services to be provided through the use of telemedicine in my medical care

## 2021-03-23 NOTE — Telephone Encounter (Signed)
RN spoke to patient. Instruction were given  from today's virtual visit 03/23/21.  AVS SUMMARY has been sent by mychart .   Patient verbalized understanding.

## 2021-03-30 ENCOUNTER — Other Ambulatory Visit: Payer: Self-pay | Admitting: Cardiology

## 2021-03-30 NOTE — Telephone Encounter (Signed)
Prescription refill request for Eliquis received. Indication: PE Last office visit:4/22  Ellyn Hack Scr:1.3 5/22 Age: 75 Weight:77.1 kg  Prescription refilled

## 2021-04-11 ENCOUNTER — Encounter: Payer: Self-pay | Admitting: Cardiology

## 2021-04-11 NOTE — Assessment & Plan Note (Signed)
Presumably still on Eliquis.  Plan is to be on Eliquis lifelong.  It is actually this is being discontinued which may be because is held for procedures.Marland Kitchen

## 2021-04-11 NOTE — Assessment & Plan Note (Signed)
Most recent echo now shows mild to moderate aortic insufficiency.  No stenosis. Unless her murmur or symptoms worsen, would probably hold off on rechecking echocardiogram for a couple years.

## 2021-04-11 NOTE — Assessment & Plan Note (Addendum)
The most notable finding on her event monitor symptom log was that she felt PVCs.  She did not notice to show atrial runs.  Structurally normal heart on echo.  Relatively benign monitor.  Continue combination of diltiazem and carvedilol.  Overall symptoms seem to be better.

## 2021-04-11 NOTE — Assessment & Plan Note (Signed)
Currently on 20 mg of simvastatin along with fenofibrate.  Labs by recent check seem to be pretty well controlled with an LDL of 81.  She will try a statin holiday for fatigue symptoms evaluation and did not notice any change.  Now back on statin.

## 2021-04-11 NOTE — Assessment & Plan Note (Signed)
Has continued weight loss.  13 more pounds noted in the last month and a half.  I congratulated her efforts.  She has been doing well with physical therapy, and actually recently graduated from it.  She is very happy with her weight loss.  She is also really trying to change her diet.

## 2021-04-11 NOTE — Assessment & Plan Note (Signed)
Blood pressures have been going up and down.  She says they do fluctuate quite a bit.  Currently borderline pressures.  I asked her to monitor pressures.  Goal is for average < 135/85 mmHg, but it is acceptable to have occasional readings that are elevated.  For now, she is on low-dose ramipril along with carvedilol and diltiazem.  Potential options for titrating up will either be carvedilol or metoprolol depending on pressures.  I asked that she monitor pressures.

## 2021-05-25 ENCOUNTER — Other Ambulatory Visit: Payer: Self-pay | Admitting: Cardiology

## 2021-08-28 ENCOUNTER — Ambulatory Visit: Payer: Medicare HMO

## 2021-08-30 ENCOUNTER — Other Ambulatory Visit (HOSPITAL_BASED_OUTPATIENT_CLINIC_OR_DEPARTMENT_OTHER): Payer: Self-pay

## 2021-08-30 ENCOUNTER — Ambulatory Visit: Payer: Medicare HMO | Attending: Internal Medicine

## 2021-08-30 DIAGNOSIS — Z23 Encounter for immunization: Secondary | ICD-10-CM

## 2021-08-30 MED ORDER — COVID-19 MRNA VACC (MODERNA) 100 MCG/0.5ML IM SUSP
INTRAMUSCULAR | 0 refills | Status: DC
  Filled 2021-08-30: qty 0.5, 1d supply, fill #0

## 2021-08-30 NOTE — Progress Notes (Signed)
   Covid-19 Vaccination Clinic  Name:  Tricia Harrison    MRN: [id number] DOB: [birth year]  08/30/2021  [name] was observed post Covid-19 immunization for 15 minutes without incident. She was provided with Vaccine Information Sheet and instruction to access the V-Safe system.   [name] was instructed to call 911 with any severe reactions post vaccine: Difficulty breathing  Swelling of face and throat  A fast heartbeat  A bad rash all over body  Dizziness and weakness

## 2021-09-10 ENCOUNTER — Other Ambulatory Visit: Payer: Self-pay | Admitting: Gastroenterology

## 2021-09-10 DIAGNOSIS — R932 Abnormal findings on diagnostic imaging of liver and biliary tract: Secondary | ICD-10-CM

## 2021-09-19 ENCOUNTER — Ambulatory Visit
Admission: RE | Admit: 2021-09-19 | Discharge: 2021-09-19 | Disposition: A | Discharge status: Home / Self Care | Payer: Medicare HMO | Source: Ambulatory Visit | Attending: Gastroenterology | Admitting: Gastroenterology

## 2021-09-19 DIAGNOSIS — R932 Abnormal findings on diagnostic imaging of liver and biliary tract: Secondary | ICD-10-CM

## 2021-09-21 ENCOUNTER — Other Ambulatory Visit: Payer: Self-pay | Admitting: Cardiology

## 2021-09-21 NOTE — Telephone Encounter (Signed)
Prescription refill request for Eliquis received. Last office visit:[place] 03/23/21 Scr:1.64 08/20/21 Age: 67f Weight:83.4kg

## 2021-10-09 ENCOUNTER — Telehealth: Payer: Self-pay | Admitting: Cardiology

## 2021-10-09 NOTE — Telephone Encounter (Signed)
   Name: Tricia Harrison DOB: 1946-09-19  MRN: [id number]  Primary Cardiologist: Glenetta Hew, MD  Chart reviewed as part of pre-operative protocol coverage.   Recommendations from PharmD are to hold Eliquis for 1.5 days prior to the procedure.    I called the patient today.  Her last dose of Eliquis was last night.  She has not taken any today and she knows to hold it.  I did call Dr. Perley Jain office and spoke with his RN.  She is aware that the pt will not have held the Eliquis for the full recommended time for the procedure.  She will d/w Dr. Watt Climes and call the patient, if needed.  The Eliquis should be resumed post op as soon as it is felt to be safe from a bleeding perspective.    Please call with questions. Richardson Dopp, PA-C 10/09/2021, 1:48 PM

## 2021-10-09 NOTE — Telephone Encounter (Signed)
Patient with diagnosis of PE on Eliquis for anticoagulation.    Procedure: Colonoscopy  Date of procedure: 10/10/21  CrCl 27.85  Per office protocol, patient can hold Eliquis for 1.5 days prior to procedure.    Please see if pt took her Eliquis dose last night. Ideally she would hold 1.5 days due to renal function. If pt procedure is in the afternoon tomorrow- most likely ok. If in the AM would need to confirm with MD that he is ok with 1 day hold with poor renal function.

## 2021-10-09 NOTE — Telephone Encounter (Signed)
   [facility] Pre-operative Risk Assessment    Patient Name: Tricia Harrison  DOB: 1946-09-25 MRN: [id number]  HEARTCARE STAFF:  - IMPORTANT!!!!!! Under Visit Info/Reason for Call, type in Other and utilize the format Clearance MM/DD/YY or Clearance TBD. Do not use dashes or single digits. - Please review there is not already an duplicate clearance open for this procedure. - If request is for dental extraction, please clarify the # of teeth to be extracted. - If the patient is currently at the dentist's office, call Pre-Op Callback Staff (MA/nurse) to input urgent request.  - If the patient is not currently in the dentist office, please route to the Pre-Op pool.  Request for surgical clearance:  What type of surgery is being performed? Colonoscopy   When is this surgery scheduled? 10/10/21  What type of clearance is required (medical clearance vs. Pharmacy clearance to hold med vs. Both)? Pharmacy  Are there any medications that need to be held prior to surgery and how long? Eliquis one day prior   Practice name and name of physician performing surgery? [facility]   What is the office phone number? [phone]   7.   What is the office fax number? [phone]  8.   Anesthesia type (None, local, MAC, general) ? Propofol   Tricia Harrison 10/09/2021, 9:12 AM  _________________________________________________________________   (provider comments below)

## 2021-10-09 NOTE — Telephone Encounter (Signed)
Will route to PharmD for rec's re: holding anticoagulation. Richardson Dopp, PA-C    10/09/2021 10:17 AM

## 2021-10-09 NOTE — Telephone Encounter (Signed)
Notes faxed to surgeon. This phone note will be removed from the preop pool. Richardson Dopp, PA-C  10/09/2021 1:52 PM

## 2021-10-12 ENCOUNTER — Ambulatory Visit: Payer: Medicare HMO | Admitting: Cardiology

## 2021-11-07 NOTE — Progress Notes (Signed)
Cardiology Clinic Note   Patient Name: Tricia Harrison Date of Encounter: 11/07/2021  Primary Care Provider:  Lujean Amel, MD Primary Cardiologist:  Glenetta Hew, MD  Patient Profile    Tricia Harrison 75 year old female presents to the clinic today for follow-up evaluation of her essential hypertension PVCs and DOE.  Past Medical History    Past Medical History:  Diagnosis Date   Anemia    as a child   Asthma    as a child   Chronic back pain    spinal stenosis and buldging disc;scoliosis   Chronic constipation    occasionally takes something OTC   Diabetes mellitus without complication ([place])    per pt "Pre" for 20+yrs   Diverticulosis    Gallstones    has known about this for 3-43yrs   GERD (gastroesophageal reflux disease)    takes Omeprazole daily   H/O hiatal hernia    Hemorrhoids    History of blood transfusion    no abnormal reaction noted   History of bronchitis    states its been a long time ago   History of gout    HLD (hyperlipidemia)    takes Fenofibrate daily   HTN (hypertension)    takes Diltiazem and Ramipril daily   Hypothyroidism    takes Synthroid daily   Left knee DJD    Nocturia    Palpitations    started in 2013 and only occasionally;last time noticed about 2-3wks ago and after drinking caffeine   PONV (postoperative nausea and vomiting)    Pulmonary embolism with acute cor pulmonale ([place]) 07/2018   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   Past Surgical History:  Procedure Laterality Date   3-day EVENT MONITOR  01/2019   Mostly normal monitor.  Predominantly sinus rhythm (rate range 48-105 bpm, average 67 bpm) 9 short atrial runs (fastest = 13 beats rate 135 bpm, longest ~17 seconds-101 bpm) -> not noted on symptom log.  No sustained arrhythmias (tachycardia or bradycardia), or pauses..  Symptoms are noted with PVCs.   COLONOSCOPY     CT ANGIOGRAM OF CHEST -  PE PROTOCOL  07/2018   Bilateral nonocclusive pulmonary emboli evidence of right heart strain consistent with "submassive "/intermediate PE.  -->  Code PE called.   DILATION AND CURETTAGE OF UTERUS     multiple about 6-7 times   ESOPHAGOGASTRODUODENOSCOPY     NM VQ LUNG SCAN ([facility] HX)  11/23/2018   Low probability for PE   RIGHT/LEFT HEART CATH AND CORONARY ANGIOGRAPHY N/A 08/19/2018   Procedure: RIGHT/LEFT HEART CATH AND CORONARY ANGIOGRAPHY;  Surgeon: Jettie Booze, MD;  Location: [facility];  Service: Cardiovascular:: Nonobstructive CAD.  Mild pulmonary pretension.  Mean PA pressure 26 mmHg with PA P 48/20 mmHg.  Normal LVEDP.   THYROID SURGERY Right 2010   TOTAL HIP ARTHROPLASTY Left 2005   TOTAL KNEE ARTHROPLASTY Left 02/21/2014   Procedure: TOTAL KNEE ARTHROPLASTY;  Surgeon: Lorn Junes, MD;  Location: [facility];  Service: Orthopedics;  Laterality: Left;   TRANSTHORACIC ECHOCARDIOGRAM  07/2018    (In setting of acute PE) mild LVH.  EF 55 to 60%.  GR 1 DD.  Aortic sclerosis but no stenosis.  Mild regurgitation.  Mild RA dilation.  Moderate LA dilation.  Moderate tricuspid regurgitation with severe primary hypertension estimated PA pressure 71 mmHg.  RV poorly visualized   TRANSTHORACIC  ECHOCARDIOGRAM  10/2018 - 11/2019   a) EF 60-65%.  GR 1 DD.  Focal basal LVH. Mod AI/ no AS. Mild LA dilation.  Normal RV size and fxn.  Mod PI w/ mild TR.  Peak PAP ~ 38 mmHg; b) Normal EF 60 to 65%.  Moderate LVH-GR 1 DD.  R WMA.  Mild AS w/ trivial AI.  Grossly normal PA, RV and RA size.  Normal RV function.  Normal PA pressures.   TRANSTHORACIC ECHOCARDIOGRAM  01/2021    EF 55%.  Normal LV function.  Mild LVH.  GR 1 DD.  Normal PA pressures.  Severe LA dilation.  (Consistent with more significant diastolic dysfunction).  Mild-possibly mild to moderate aortic insufficiency.  Borderline elevated RAP.  Trivial pericardial effusion.  (Stable)   VAGINAL HYSTERECTOMY  1979    Allergies  Allergies   Allergen Reactions   Atorvastatin Other (See Comments)    Muscle pain   Codeine Nausea And Vomiting   Morphine And Related     B/p drops     History of Present Illness    Tricia Harrison has a PMH of submassive PE, bilateral lower extremity edema, and essential hypertension.  Her PMH also includes progressive dyspnea and palpitations.  She was connected with via telephone 03/23/2021.  During that time she reported that she was doing well.  She was happy about her recent weight loss.  She had been discharged from physical therapy and was trying to continue the exercises on her own at home.  She was working on maintaining a more strict diet.  Her dyspnea has been improving.  It was noted that she was still quite deconditioned.  She noticed her palpitations on and off with exertion.  However, these were not overly concerning to her.  It was felt that her dyspnea was related to her body habitus.  She denied chest pain, edema, PND, increased heart rate, presyncope and syncope.  She also denied neurological sequela and lower extremity claudication.  She was asked to continue to monitor her blood pressures.  She presents to the clinic today for follow-up evaluation states she has noticed that over the past few months she has had increased work of breathing with increased physical activity.  She presents with her son.  She reports that she is sedentary due to her left knee and back pain.  She is able to do all of her ADLs but has no formal exercise routine.  We reviewed the importance of increasing physical activity.  She notes occasional swelling in her lower extremities after long periods of sitting or periods of travel.  Her blood pressure is elevated today initially at 152/78 and on recheck 144/74.  I will increase her ramipril to 5 mg daily, order a BMP in 1 week, give a salty 6 diet sheet, give Carbon support stocking sheet, have her maintain blood pressure log, and plan follow-up in 1 to 2  months.  Today she denies chest pain, lower extremity edema, fatigue, palpitations, melena, hematuria, hemoptysis, diaphoresis, weakness, presyncope, syncope, orthopnea, and PND.   Home Medications    Prior to Admission medications   Medication Sig Start Date End Date Taking? Authorizing Provider  acetaminophen (TYLENOL) 500 MG tablet Take 1,000 mg by mouth every 6 (six) hours as needed for mild pain.    [provider]  albuterol (VENTOLIN HFA) 108 (90 Base) MCG/ACT inhaler Inhale 2 puffs into the lungs every 6 (six) hours as needed. 07/02/19   Lauraine Rinne,  NP  apixaban (ELIQUIS) 5 MG TABS tablet TAKE 1 TABLET BY MOUTH  TWICE DAILY 09/21/21   Leonie Man, MD  azaTHIOprine (IMURAN) 50 MG tablet Take 100 mg by mouth daily. 08/08/20   [provider]  bimatoprost (LUMIGAN) 0.01 % SOLN Place 1 drop into both eyes at bedtime. 02/06/21   [provider]  carvedilol (COREG) 6.25 MG tablet TAKE 1 TABLET BY MOUTH TWO  TIMES DAILY WITH A MEAL 04/23/19   Leonie Man, MD  Cholecalciferol (VITAMIN D3) 2000 UNITS TABS Take 2,000 Units by mouth daily.    [provider]  COVID-19 mRNA vaccine, Moderna, 100 MCG/0.5ML injection Inject into the muscle. 08/30/21   Carlyle Basques, MD  diltiazem (CARDIZEM LA) 360 MG 24 hr tablet Take 360 mg by mouth daily.    [provider]  dorzolamide-timolol (COSOPT) 22.3-6.8 MG/ML ophthalmic solution Place 1 drop into both eyes 2 times daily. 11/07/20   [provider]  fenofibrate (TRICOR) 145 MG tablet Take 145 mg by mouth daily.    [provider]  HUMIRA PEN 40 MG/0.4ML PNKT SMARTSIG:40 Milligram(s) SUB-Q Every 2 Weeks 01/22/21   [provider]  ketorolac (ACULAR) 0.4 % SOLN Apply 1 drop to eye 4 (four) times daily. 11/07/20   [provider]  levothyroxine (SYNTHROID, LEVOTHROID) 50 MCG tablet Take 50 mcg by mouth daily before breakfast.    [provider]  omeprazole  (PRILOSEC) 20 MG capsule Take 20 mg by mouth daily.    [provider]  polyethylene glycol (MIRALAX / GLYCOLAX) 17 g packet Take 17 g by mouth daily as needed for mild constipation.    [provider]  ramipril (ALTACE) 2.5 MG capsule Take 2.5 mg by mouth daily. 11/08/20   [provider]  rosuvastatin (CRESTOR) 20 MG tablet TAKE 1 TABLET BY MOUTH  DAILY AT 6 PM. 05/25/21   Leonie Man, MD    Family History    Family History  Problem Relation Age of Onset   Uterine cancer Mother    Heart disease Mother    Hypertension Mother    Diabetes Mother    Cirrhosis Father    Pneumonia Father    Lymphoma Sister        ,non hodgkin    Diabetes Sister    Breast cancer Neg Hx    She indicated that her mother is deceased. She indicated that her father is deceased. She indicated that her sister is alive. She indicated that the status of her neg hx is unknown.  Social History    Social History   Socioeconomic History   Marital status: Single    Spouse name: Not on file   Number of children: Not on file   Years of education: Not on file   Highest education level: Not on file  Occupational History   Occupation: Retired  Tobacco Use   Smoking status: Former    Packs/day: 0.50    Years: 20.00    Pack years: 10.00    Types: Cigarettes    Start date: 09/04/1964    Quit date: 11/25/1993    Years since quitting: 27.9   Smokeless tobacco: Never   Tobacco comments:    quit smoking in 1995  Substance and Sexual Activity   Alcohol use: Yes    Comment: Socially.   Drug use: No   Sexual activity: Not on file  Other Topics Concern   Not on file  Social History Narrative  Pt lives w/ sister and nephew.   Social Determinants of Health   Financial Resource Strain: Not on file  Food Insecurity: Not on file  Transportation Needs: Not on file  Physical Activity: Not on file  Stress: Not on file  Social Connections: Not on file  Intimate Partner Violence: Not  on file     Review of Systems    General:  No chills, fever, night sweats or weight changes.  Cardiovascular:  No chest pain, dyspnea on exertion, edema, orthopnea, palpitations, paroxysmal nocturnal dyspnea. Dermatological: No rash, lesions/masses Respiratory: No cough, dyspnea Urologic: No hematuria, dysuria Abdominal:   No nausea, vomiting, diarrhea, bright red blood per rectum, melena, or hematemesis Neurologic:  No visual changes, wkns, changes in mental status. All other systems reviewed and are otherwise negative except as noted above.  Physical Exam    VS:  There were no vitals taken for this visit. , BMI There is no height or weight on file to calculate BMI. GEN: Well nourished, well developed, in no acute distress. HEENT: normal. Neck: Supple, no JVD, carotid bruits, or masses. Cardiac: RRR, no murmurs, rubs, or gallops. No clubbing, cyanosis, edema.  Radials/DP/PT 2+ and equal bilaterally.  Respiratory:  Respirations regular and unlabored, clear to auscultation bilaterally. GI: Soft, nontender, nondistended, BS + x 4. MS: no deformity or atrophy. Skin: warm and dry, no rash. Neuro:  Strength and sensation are intact. Psych: Normal affect.  Accessory Clinical Findings    Recent Labs: No results found for requested labs within last 8760 hours.   Recent Lipid Panel    Component Value Date/Time   CHOL 190 10/08/2018 0929   TRIG 71 10/08/2018 0929   HDL 84 10/08/2018 0929   CHOLHDL 2.3 10/08/2018 0929   CHOLHDL 3.1 08/19/2018 0638   VLDL 17 08/19/2018 0638   LDLCALC 92 10/08/2018 0929    ECG personally reviewed by me today-none today.  Echocardiogram 02/20/2021  IMPRESSIONS     1. Left ventricular ejection fraction, by estimation, is 55%. The left  ventricle has normal function. The left ventricle has no regional wall  motion abnormalities. There is mild left ventricular hypertrophy. Left  ventricular diastolic parameters are  consistent with Grade I  diastolic dysfunction (impaired relaxation).   2. Right ventricular systolic function is normal. The right ventricular  size is mildly enlarged. There is normal pulmonary artery systolic  pressure.   3. Left atrial size was severely dilated.   4. The mitral valve is grossly normal. Trivial mitral valve  regurgitation. No evidence of mitral stenosis.   5. The aortic valve is grossly normal. There is mild calcification and  thickening of the aortic valve. Aortic valve regurgitation is mild, but  may be slightly underestimated, jet appears eccentric. No aortic stenosis  is present.   6. The inferior vena cava is normal in size with <50% respiratory  variability, suggesting right atrial pressure of 8 mmHg.   7. Trivial pericardial effusion is circumferential.  Assessment & Plan   1.  Essential hypertension-BP today 144/74.  Well-controlled at home. Continue carvedilol, diltiazem,  Increase ramipril 5 mg daily Heart healthy low-sodium diet-salty 6 given Increase physical activity as tolerated-chair exercises given Repeat BMP in 1 week Maintain blood pressure log and bring to next appointment  Hyperlipidemia-LDL 81 11/15/20 Continue fenofibrate, rosuvastatin Heart healthy low-sodium high-fiber diet.   Increase physical activity as tolerated  PVCs-occasionally notices extra heartbeats.  PVCs noted on cardiac event monitor. Avoid triggers caffeine, chocolate, EtOH, dehydration etc. Continue  carvedilol  Aortic regurgitation-has noticed some increased dyspnea.  Dyspnea appears to be related to body habitus and lack of fitness.  Echocardiogram 02/20/2021 showed mild calcification and thickening of the aortic valve with mild aortic valve regurgitation but no stenosis. Repeat echocardiogram when clinically indicated Increase physical activity as tolerated-chair exercises given  Morbid obesity-weight [id number].  Continues to lose weight.  Has been continuing strict diet and increase physical  activity. Continue weight loss  History of pulmonary embolism-reports compliance with apixaban and denies bleeding issues.  Will be on lifelong apixaban.  Previously indicated that her medication may be held prior to procedures. Continue apixaban  Disposition: Follow-up with Dr. Ellyn Hack in 1-2 months.  Jossie Ng. Hasson Gaspard NP-C    11/07/2021, 12:48 PM [address] Office [phone] Fax [phone]  Notice: This dictation was prepared with Dragon dictation along with smaller phrase technology. Any transcriptional errors that result from this process are unintentional and may not be corrected upon review.  I spent 14 minutes examining this patient, reviewing medications, and using patient centered shared decision making involving her cardiac care.  Prior to her visit I spent greater than 20 minutes reviewing her past medical history,  medications, and prior cardiac tests.

## 2021-11-09 ENCOUNTER — Ambulatory Visit: Payer: Medicare HMO | Admitting: General Practice

## 2021-11-09 ENCOUNTER — Other Ambulatory Visit: Payer: Self-pay

## 2021-11-09 ENCOUNTER — Encounter: Payer: Self-pay | Admitting: General Practice

## 2021-11-09 VITALS — BP 144/74 | HR 61 | Ht 61.0 in | Wt 175.2 lb

## 2021-11-09 DIAGNOSIS — I351 Nonrheumatic aortic (valve) insufficiency: Secondary | ICD-10-CM | POA: Diagnosis not present

## 2021-11-09 DIAGNOSIS — E785 Hyperlipidemia, unspecified: Secondary | ICD-10-CM

## 2021-11-09 DIAGNOSIS — Z86711 Personal history of pulmonary embolism: Secondary | ICD-10-CM

## 2021-11-09 DIAGNOSIS — I10 Essential (primary) hypertension: Secondary | ICD-10-CM

## 2021-11-09 DIAGNOSIS — I493 Ventricular premature depolarization: Secondary | ICD-10-CM

## 2021-11-09 DIAGNOSIS — Z79899 Other long term (current) drug therapy: Secondary | ICD-10-CM

## 2021-11-09 MED ORDER — RAMIPRIL 5 MG PO CAPS: 5.0000 mg | ORAL_CAPSULE | Freq: Every day | ORAL | 1 refills | Status: DC

## 2021-11-09 NOTE — Patient Instructions (Addendum)
Medication Instructions:  INCREASE RAMIPRIL 5MG DAILY  *If you need a refill on your cardiac medications before your next appointment, please call your pharmacy*  Lab Work:   Testing/Procedures:  BMET IN 1 WEEK  NONE  Special Instructions PLEASE READ AND FOLLOW SALTY 6-ATTACHED-1,875m daily  PLEASE READ AND FOLLOW CHAIR EXERCISES-ATTACHED  PLEASE CONTINUE TO USE INCENTIVE SPIROMETER  TAKE AND LOG YOUR BLOOD PRESSURE  PLEASE PURCHASE AND WEAR COMPRESSION STOCKINGS DAILY AND TAKE OFF AT BEDTIME. Compression stockings are elastic socks that squeeze the legs. They help to increase blood flow to the legs and to decrease swelling in the legs from fluid retention, and reduce the chance of developing blood clots in the lower legs. Please put on in the AM when dressing and off at night when dressing for bed.  LET THEM KNOW THAT YOU NEED KNEE HIGH'S WITH COMPRESSION OF 15-20 mmhg.  [employer]  [phone]; [place] [id number] ([phone] EMAIL   [email or web address].  PLEASE MAKE SURE TO ELEVATE YOUR FEET & LEGS WHILE SITTING, THIS WILL HELP WITH THE SWELLING ALSO.   Follow-Up: Your next appointment:  1-2 MONTHS month(s) In Person with DGlenetta Hew MD OHopeFNP-C   [facility] you and your health needs are our priority.  As part of our continuing mission to provide you with exceptional heart care, we have created designated Provider Care Teams.  These Care Teams include your primary Cardiologist (physician) and Advanced Practice Providers (APPs -  Physician Assistants and Nurse Practitioners) who all work together to provide you with the care you need, when you need it.            6 SALTY THINGS TO AVOID     1,800MG DAILY     Exercises to do While Sitting Exercises that you do while sitting (chair exercises) can give you many of the same benefits as full exercise. Benefits include strengthening your heart, burning  calories, and keeping muscles and joints healthy. Exercise can also improve your mood and help with depression and anxiety. You may benefit from chair exercises if you are unable to do standing exercises due to: Diabetic foot pain. Obesity. Illness. Arthritis. Recovery from surgery or injury. Breathing problems. Balance problems. Another type of disability. Before starting chair exercises, check with your health care provider or a physical therapist to find out how much exercise you can tolerate and which exercises are safe for you. If your health care provider approves: Start out slowly and build up over time. Aim to work up to about 10-20 minutes for each exercise session. Make exercise part of your daily routine. Drink water when you exercise. Do not wait until you are thirsty. Drink every 10-15 minutes. Stop exercising right away if you have pain, nausea, shortness of breath, or dizziness. If you are exercising in a wheelchair, make sure to lock the wheels. Ask your health care provider whether you can do tai chi or yoga. Many positions in these mind-body exercises can be modified to do while seated. Warm-up Before starting other exercises: Sit up as straight as you can. Have your knees bent at 90 degrees, which is the shape of the capital letter "L." Keep your feet flat on the floor. Sit at the front edge of your chair, if you can. Pull in (tighten) the muscles in your abdomen and stretch your spine and neck as straight as you can. Hold this position for a few minutes. Breathe  in and out evenly. Try to concentrate on your breathing, and relax your mind. Stretching Exercise A: Arm stretch Hold your arms out straight in front of your body. Bend your hands at the wrist with your fingers pointing up, as if signaling someone to stop. Notice the slight tension in your forearms as you hold the position. Keeping your arms out and your hands bent, rotate your hands outward as far as you can  and hold this stretch. Aim to have your thumbs pointing up and your pinkie fingers pointing down. Slowly repeat arm stretches for one minute as tolerated. Exercise B: Leg stretch If you can move your legs, try to "draw" letters on the floor with the toes of your foot. Write your name with one foot. Write your name with the toes of your other foot. Slowly repeat the movements for one minute as tolerated. Exercise C: Reach for the sky Reach your hands as far over your head as you can to stretch your spine. Move your hands and arms as if you are climbing a rope. Slowly repeat the movements for one minute as tolerated. Range of motion exercises Exercise A: Shoulder roll Let your arms hang loosely at your sides. Lift just your shoulders up toward your ears, then let them relax back down. When your shoulders feel loose, rotate your shoulders in backward and forward circles. Do shoulder rolls slowly for one minute as tolerated. Exercise B: March in place As if you are marching, pump your arms and lift your legs up and down. Lift your knees as high as you can. If you are unable to lift your knees, just pump your arms and move your ankles and feet up and down. March in place for one minute as tolerated. Exercise C: Seated jumping jacks Let your arms hang down straight. Keeping your arms straight, lift them up over your head. Aim to point your fingers to the ceiling. While you lift your arms, straighten your legs and slide your heels along the floor to your sides, as wide as you can. As you bring your arms back down to your sides, slide your legs back together. If you are unable to use your legs, just move your arms. Slowly repeat seated jumping jacks for one minute as tolerated. Strengthening exercises Exercise A: Shoulder squeeze Hold your arms straight out from your body to your sides, with your elbows bent and your fists pointed at the ceiling. Keeping your arms in the bent position, move  them forward so your elbows and forearms meet in front of your face. Open your arms back out as wide as you can with your elbows still bent, until you feel your shoulder blades squeezing together. Hold for 5 seconds. Slowly repeat the movements forward and backward for one minute as tolerated. Contact a health care provider if: You have to stop exercising due to any of the following: Pain. Nausea. Shortness of breath. Dizziness. Fatigue. You have significant pain or soreness after exercising. Get help right away if: You have chest pain. You have difficulty breathing. These symptoms may represent a serious problem that is an emergency. Do not wait to see if the symptoms will go away. Get medical help right away. Call your local emergency services (911 in the [place] Do not drive yourself to the hospital. Summary Exercises that you do while sitting (chair exercises) can strengthen your heart, burn calories, and keep muscles and joints healthy. You may benefit from chair exercises if you are unable to do  standing exercises due to diabetic foot pain, obesity, recovery from surgery or injury, or other conditions. Before starting chair exercises, check with your health care provider or a physical therapist to find out how much exercise you can tolerate and which exercises are safe for you. This information is not intended to replace advice given to you by your health care provider. Make sure you discuss any questions you have with your health care provider. Document Revised: 01/07/2021 Document Reviewed: 01/07/2021 Elsevier Patient Education  2022 [employer].

## 2021-11-13 ENCOUNTER — Ambulatory Visit: Payer: Self-pay | Admitting: Surgery

## 2021-11-14 ENCOUNTER — Telehealth: Payer: Self-pay

## 2021-11-14 NOTE — Telephone Encounter (Addendum)
° °  Pre-operative Risk Assessment    Patient Name: Tricia Harrison  DOB: February 23, 1946 MRN: [id number]     Request for Surgical Clearance{   Procedure:   Laparoscopic cholecystectomy  Date of Surgery:  Clearance TBD                                 Surgeon:  Dr Michael Boston Surgeon's Group or Practice Name:  [facility]  Phone number:  [phone] (Contact Person Illene Regulus, CMA) Fax number:  [phone]   Type of Clearance Requested:   - Medical  - Pharmacy:  Hold Apixaban (Eliquis) hold for how long?   Type of Anesthesia:  General   Additional requests/questions:   n/a  Signed, Daryan Buell T   11/14/2021, 4:04 PM

## 2021-11-15 NOTE — Telephone Encounter (Signed)
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    Procedure:  Laparoscopic cholecystectomy Date of procedure: TBD  CrCl 28 mL/min Platelet count 384K  Per office protocol, patient can hold Eliquis for 1 days prior to procedure.

## 2021-11-15 NOTE — Telephone Encounter (Signed)
° °  Primary Cardiologist: Glenetta Hew, MD  Chart reviewed as part of pre-operative protocol coverage. Given past medical history and time since last visit, based on ACC/AHA guidelines, Tricia Harrison would be at acceptable risk for the planned procedure without further cardiovascular testing.   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   Procedure:  Laparoscopic cholecystectomy Date of procedure: TBD CrCl 28 mL/min Platelet count 384K Per office protocol, patient can hold Eliquis for 1 days prior to procedure. Please resume as soon as hemostasis is achieved at the discretion of the surgeon.  I will route this recommendation to the requesting party via Epic fax function and remove from pre-op pool.  Please call with questions.  [name], NP-C 11/15/2021, 7:58 AM

## 2021-12-31 NOTE — Patient Instructions (Signed)
DUE TO COVID-19 ONLY ONE VISITOR IS ALLOWED TO COME WITH YOU AND STAY IN THE WAITING ROOM ONLY DURING PRE OP AND PROCEDURE.   **NO VISITORS ARE ALLOWED IN THE SHORT STAY AREA OR RECOVERY ROOM!!**  IF YOU WILL BE ADMITTED INTO THE HOSPITAL YOU ARE ALLOWED ONLY TWO SUPPORT PEOPLE DURING VISITATION HOURS ONLY (7 AM -8PM)   The support person(s) must pass our screening, gel in and out, and wear a mask at all times, including in the patients room. Patients must also wear a mask when staff or their support person are in the room. Visitors GUEST BADGE MUST BE WORN VISIBLY  One adult visitor may remain with you overnight and MUST be in the room by 8 P.M.  No visitors under the age of 60. Any visitor under the age of 46 must be accompanied by an adult.    COVID SWAB TESTING MUST BE COMPLETED ON:  01/08/22   Site: [facility] [place] Enter: Main Entrance have a seat in the waiting area to the right of main entrance (DO NOT [place]!!!!!) Dial: [phone] to alert staff you have arrived  You are not required to quarantine, however you are required to wear a well-fitted mask when you are out and around people not in your household.  Hand Hygiene often Do NOT share personal items Notify your provider if you are in close contact with someone who has COVID or you develop fever 100.4 or greater, new onset of sneezing, cough, sore throat, shortness of breath or body aches.  [place], [address], must go inside of the hospital, NOT A DRIVE THRU!  (Must self quarantine after testing. Follow instructions on handout.)       Your procedure is scheduled on: 01/10/22   Report to [facility], In Main Entrance    Report to short stay at : 5:15 AM   Call this number if you have problems the morning of surgery [phone]   Do not eat food :After Midnight.   May have liquids until: 4:30 AM     day of surgery  CLEAR LIQUID DIET  Foods Allowed                                                                     Foods Excluded  Water, Black Coffee and tea, regular and decaf                             liquids that you cannot  Plain Jell-O in any flavor  (No red)                                           see through such as: Fruit ices (not with fruit pulp)                                     milk, soups, orange juice  Iced Popsicles (No red)                                    All solid food                                   Apple juices Sports drinks like Gatorade (No red) Lightly seasoned clear broth or consume(fat free) Sugar Sample Menu Breakfast                                Lunch                                     Supper Cranberry juice                    Beef broth                            Chicken broth Jell-O                                     Grape juice                           Apple juice Coffee or tea                        Jell-O                                      Popsicle                                                Coffee or tea                        Coffee or tea   Complete one Gatorade drink the morning of surgery 3 hours prior to scheduled surgery at: 4:30 AM.     The day of surgery:  Drink ONE (1) Pre-Surgery Clear Ensure or G2 at AM the morning of surgery. Drink in one sitting. Do not sip.  This drink was given to you during your hospital  pre-op appointment visit. Nothing else to drink after completing the  Pre-Surgery Clear Ensure or G2.          If you have questions, please contact your surgeons office.  FOLLOW BOWEL PREP AND ANY ADDITIONAL PRE OP INSTRUCTIONS YOU RECEIVED FROM YOUR SURGEON'S OFFICE!!!     Oral Hygiene is also important to reduce your risk of infection.                                    Remember - BRUSH YOUR TEETH THE MORNING OF SURGERY WITH YOUR REGULAR TOOTHPASTE   Do  NOT smoke after Midnight   Take  these medicines the morning of surgery with A SIP OF WATER: carvedilol,diltiazem,synthroid.Use inhalers and eye drops as usual.  DO NOT TAKE ANY ORAL DIABETIC MEDICATIONS DAY OF YOUR SURGERY                              You may not have any metal on your body including hair pins, jewelry, and body piercing             Do not wear make-up, lotions, powders, perfumes/cologne, or deodorant  Do not wear nail polish including gel and S&S, artificial/acrylic nails, or any other type of covering on natural nails including finger and toenails. If you have artificial nails, gel coating, etc. that needs to be removed by a nail salon please have this removed prior to surgery or surgery may need to be canceled/ delayed if the surgeon/ anesthesia feels like they are unable to be safely monitored.   Do not shave  48 hours prior to surgery.    Do not bring valuables to the hospital. [place].   Contacts, dentures or bridgework may not be worn into surgery.   Bring small overnight bag day of surgery.    Patients discharged on the day of surgery will not be allowed to drive home.  Someone needs to stay with you for the first 24 hours after anesthesia.   Special Instructions: Bring a copy of your healthcare power of attorney and living will documents         the day of surgery if you haven't scanned them before.              Please read over the following fact sheets you were given: IF YOU HAVE QUESTIONS ABOUT YOUR PRE-OP INSTRUCTIONS PLEASE CALL [phone]     [facility] - Preparing for Surgery Before surgery, you can play an important role.  Because skin is not sterile, your skin needs to be as free of germs as possible.  You can reduce the number of germs on your skin by washing with CHG (chlorahexidine gluconate) soap before surgery.  CHG is an antiseptic cleaner which kills germs and bonds with the skin to continue killing germs even after  washing. Please DO NOT use if you have an allergy to CHG or antibacterial soaps.  If your skin becomes reddened/irritated stop using the CHG and inform your nurse when you arrive at Short Stay. Do not shave (including legs and underarms) for at least 48 hours prior to the first CHG shower.  You may shave your face/neck. Please follow these instructions carefully:  1.  Shower with CHG Soap the night before surgery and the  morning of Surgery.  2.  If you choose to wash your hair, wash your hair first as usual with your  normal  shampoo.  3.  After you shampoo, rinse your hair and body thoroughly to remove the  shampoo.                           4.  Use CHG as you would any other liquid soap.  You can apply chg directly  to the skin and wash  Gently with a scrungie or clean washcloth.  5.  Apply the CHG Soap to your body ONLY FROM THE NECK DOWN.   Do not use on face/ open                           Wound or open sores. Avoid contact with eyes, ears mouth and genitals (private parts).                       Wash face,  Genitals (private parts) with your normal soap.             6.  Wash thoroughly, paying special attention to the area where your surgery  will be performed.  7.  Thoroughly rinse your body with warm water from the neck down.  8.  DO NOT shower/wash with your normal soap after using and rinsing off  the CHG Soap.                9.  Pat yourself dry with a clean towel.            10.  Wear clean pajamas.            11.  Place clean sheets on your bed the night of your first shower and do not  sleep with pets. Day of Surgery : Do not apply any lotions/deodorants the morning of surgery.  Please wear clean clothes to the hospital/surgery center.  FAILURE TO FOLLOW THESE INSTRUCTIONS MAY RESULT IN THE CANCELLATION OF YOUR SURGERY PATIENT SIGNATURE_________________________________  NURSE  SIGNATURE__________________________________  ________________________________________________________________________

## 2022-01-01 ENCOUNTER — Other Ambulatory Visit: Payer: Self-pay

## 2022-01-01 ENCOUNTER — Encounter (HOSPITAL_COMMUNITY)
Admission: RE | Admit: 2022-01-01 | Discharge: 2022-01-01 | Disposition: A | Discharge status: Home / Self Care | Payer: Medicare HMO | Source: Ambulatory Visit | Attending: Surgery | Admitting: Surgery

## 2022-01-01 ENCOUNTER — Encounter (HOSPITAL_COMMUNITY): Payer: Self-pay

## 2022-01-01 VITALS — BP 157/57 | HR 67 | Temp 98.3°F | Resp 18 | Ht 61.0 in | Wt 176.2 lb

## 2022-01-01 DIAGNOSIS — E119 Type 2 diabetes mellitus without complications: Secondary | ICD-10-CM | POA: Insufficient documentation

## 2022-01-01 DIAGNOSIS — I129 Hypertensive chronic kidney disease with stage 1 through stage 4 chronic kidney disease, or unspecified chronic kidney disease: Secondary | ICD-10-CM | POA: Insufficient documentation

## 2022-01-01 DIAGNOSIS — E1122 Type 2 diabetes mellitus with diabetic chronic kidney disease: Secondary | ICD-10-CM | POA: Insufficient documentation

## 2022-01-01 DIAGNOSIS — K8050 Calculus of bile duct without cholangitis or cholecystitis without obstruction: Secondary | ICD-10-CM | POA: Diagnosis not present

## 2022-01-01 DIAGNOSIS — Z01812 Encounter for preprocedural laboratory examination: Secondary | ICD-10-CM | POA: Diagnosis present

## 2022-01-01 DIAGNOSIS — K219 Gastro-esophageal reflux disease without esophagitis: Secondary | ICD-10-CM | POA: Diagnosis not present

## 2022-01-01 DIAGNOSIS — I10 Essential (primary) hypertension: Secondary | ICD-10-CM | POA: Insufficient documentation

## 2022-01-01 HISTORY — DX: Acute embolism and thrombosis of unspecified deep veins of unspecified lower extremity: I82.409

## 2022-01-01 HISTORY — DX: Chronic kidney disease, unspecified: N18.9

## 2022-01-01 HISTORY — DX: Cardiac murmur, unspecified: R01.1

## 2022-01-01 LAB — CBC
HCT: 33.2 % — ABNORMAL LOW (ref 36.0–46.0)
Hemoglobin: 11 g/dL — ABNORMAL LOW (ref 12.0–15.0)
MCH: 34.2 pg — ABNORMAL HIGH (ref 26.0–34.0)
MCHC: 33.1 g/dL (ref 30.0–36.0)
MCV: 103.1 fL — ABNORMAL HIGH (ref 80.0–100.0)
Platelets: 360 10*3/uL (ref 150–400)
RBC: 3.22 MIL/uL — ABNORMAL LOW (ref 3.87–5.11)
RDW: 14.3 % (ref 11.5–15.5)
WBC: 6.9 10*3/uL (ref 4.0–10.5)
nRBC: 0 % (ref 0.0–0.2)

## 2022-01-01 LAB — BASIC METABOLIC PANEL
Anion gap: 7 (ref 5–15)
BUN: 23 mg/dL (ref 8–23)
CO2: 25 mmol/L (ref 22–32)
Calcium: 8.8 mg/dL — ABNORMAL LOW (ref 8.9–10.3)
Chloride: 109 mmol/L (ref 98–111)
Creatinine, Ser: 1.33 mg/dL — ABNORMAL HIGH (ref 0.44–1.00)
GFR, Estimated: 42 mL/min — ABNORMAL LOW (ref >60)
Glucose, Bld: 95 mg/dL (ref 70–99)
Potassium: 3.8 mmol/L (ref 3.5–5.1)
Sodium: 141 mmol/L (ref 135–145)

## 2022-01-01 LAB — HEMOGLOBIN A1C
Hgb A1c MFr Bld: 5 % (ref 4.8–5.6)
Mean Plasma Glucose: 96.8 mg/dL

## 2022-01-01 LAB — GLUCOSE, CAPILLARY: Glucose-Capillary: 97 mg/dL (ref 70–99)

## 2022-01-01 NOTE — Progress Notes (Signed)
COVID Vaccine Completed: Yes Date COVID Vaccine completed: 08/30/21 x 4 COVID vaccine manufacturer:    Moderna    COVID Test: 01/08/22 @ 8:00 AM Bowel prep reminder: N/A  PCP - Dr. Trena Platt Cardiologist - Dr. Glenetta Hew. Clearance: [name]: PA-C: 11/15/21: EPIC  Chest x-ray -  EKG - 01/23/21 Stress Test -  ECHO - 02/20/21 Cardiac Cath -  Pacemaker/ICD device last checked:  Sleep Study - Yes CPAP - NO  Fasting Blood Sugar - 140's Checks Blood Sugar __1___ times a week.  Blood Thinner Instructions: Eliquis will be hold on: 01/07/22 as per Dr. Johney Maine instructions. Aspirin Instructions: Last Dose:  Anesthesia review: Hx: PE,DIA,HTN,Palpitations,Heart murmur,CKD(III),OSA(NO CPAP),DVT.  Patient denies shortness of breath, fever, cough and chest pain at PAT appointment   Patient verbalized understanding of instructions that were given to them at the PAT appointment. Patient was also instructed that they will need to review over the PAT instructions again at home before surgery.

## 2022-01-02 ENCOUNTER — Telehealth: Payer: Self-pay | Admitting: Cardiology

## 2022-01-02 NOTE — Telephone Encounter (Signed)
Patient called to say she is having surgery Friday and this information should be on mychart

## 2022-01-02 NOTE — Progress Notes (Signed)
Anesthesia Chart Review   Case: [id number] Date/Time: 01/10/22 0715   Procedure: LAPAROSCOPIC CHOLECYSTECTOMY SINGLE SITE WITH INTRAOPERATIVE CHOLANGIOGRAM   Anesthesia type: General   Pre-op diagnosis: SYMPTOMATIC BILIARY COLIC, PROBABLE CHRONIC CHOLECYSTITIS   Location: [facility] ROOM 04 / [facility]   Surgeons: Michael Boston, MD       DISCUSSION:75 y.o. former smoker with h/o PONV, GERD, HTN, DM II, CKD, PE, symptomatic biliary colic scheduled for above procedure 01/10/2022 with Dr. Michael Boston.   Per cardiology preoperative evaluation 11/15/21, "Chart reviewed as part of pre-operative protocol coverage. Given past medical history and time since last visit, based on ACC/AHA guidelines, Tricia Harrison would be at acceptable risk for the planned procedure without further cardiovascular testing.    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   Procedure:  Laparoscopic cholecystectomy Date of procedure: TBD CrCl 28 mL/min Platelet count 384K Per office protocol, patient can hold Eliquis for 1 days prior to procedure. Please resume as soon as hemostasis is achieved at the discretion of the surgeon."  Anticipate pt can proceed with planned procedure barring acute status change.   VS: BP (!) 157/57    Pulse 67    Temp 36.8 C (Oral)    Resp 18    Ht 5\' 1"  (1.549 m)    Wt 79.9 kg    SpO2 100%    BMI 33.30 kg/m   PROVIDERS: Koirala, Dibas, MD is PCP   Primary Cardiologist: Glenetta Hew, MD  LABS: Labs reviewed: Acceptable for surgery. (all labs ordered are listed, but only abnormal results are displayed)  Labs Reviewed  CBC - Abnormal; Notable for the following components:      Result Value   RBC 3.22 (*)    Hemoglobin 11.0 (*)    HCT 33.2 (*)    MCV 103.1 (*)    MCH 34.2 (*)    All other components within normal limits  BASIC METABOLIC PANEL - Abnormal; Notable for the following components:   Creatinine, Ser 1.33 (*)    Calcium 8.8 (*)    GFR, Estimated 42  (*)    All other components within normal limits  HEMOGLOBIN A1C  GLUCOSE, CAPILLARY     IMAGES:   EKG: 01/23/21 Rate 73 bpm  NSR LAFB Nonspecific T wave abnormality   CV: Echo 02/20/2021 1. Left ventricular ejection fraction, by estimation, is 55%. The left  ventricle has normal function. The left ventricle has no regional wall  motion abnormalities. There is mild left ventricular hypertrophy. Left  ventricular diastolic parameters are  consistent with Grade I diastolic dysfunction (impaired relaxation).   2. Right ventricular systolic function is normal. The right ventricular  size is mildly enlarged. There is normal pulmonary artery systolic  pressure.   3. Left atrial size was severely dilated.   4. The mitral valve is grossly normal. Trivial mitral valve  regurgitation. No evidence of mitral stenosis.   5. The aortic valve is grossly normal. There is mild calcification and  thickening of the aortic valve. Aortic valve regurgitation is mild, but  may be slightly underestimated, jet appears eccentric. No aortic stenosis  is present.   6. The inferior vena cava is normal in size with <50% respiratory  variability, suggesting right atrial pressure of 8 mmHg.   7. Trivial pericardial effusion is circumferential.   Cardiac Cath 4/66/5993 LV end diastolic pressure is normal. There is no aortic valve stenosis. Hemodynamic findings consistent with mild pulmonary hypertension. Nonobstructive CAD. CO  6.9 L/min; CI 3.6; PA 48/20, mean PA 26 mmHg; elevated RVSP 50 mm Hg. Ao sat 96%; PA sat 73%   Past Medical History:  Diagnosis Date   Anemia    as a child   Asthma    as a child   Chronic back pain    spinal stenosis and buldging disc;scoliosis   Chronic constipation    occasionally takes something OTC   Chronic kidney disease    Diabetes mellitus without complication ([place])    per pt "Pre" for 20+yrs   Diverticulosis    DVT (deep venous thrombosis) (HCC)    Gallstones     has known about this for 3-53yrs   GERD (gastroesophageal reflux disease)    takes Omeprazole daily   H/O hiatal hernia    Heart murmur    Hemorrhoids    History of blood transfusion    no abnormal reaction noted   History of bronchitis    states its been a long time ago   History of gout    HLD (hyperlipidemia)    takes Fenofibrate daily   HTN (hypertension)    takes Diltiazem and Ramipril daily   Hypothyroidism    takes Synthroid daily   Left knee DJD    Nocturia    Palpitations    started in 2013 and only occasionally;last time noticed about 2-3wks ago and after drinking caffeine   PONV (postoperative nausea and vomiting)    Pulmonary embolism with acute cor pulmonale ([place]) 07/2018   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    Past Surgical History:  Procedure Laterality Date   3-day EVENT MONITOR  01/2019   Mostly normal monitor.  Predominantly sinus rhythm (rate range 48-105 bpm, average 67 bpm) 9 short atrial runs (fastest = 13 beats rate 135 bpm, longest ~17 seconds-101 bpm) -> not noted on symptom log.  No sustained arrhythmias (tachycardia or bradycardia), or pauses..  Symptoms are noted with PVCs.   COLONOSCOPY     CT ANGIOGRAM OF CHEST - PE PROTOCOL  07/2018   Bilateral nonocclusive pulmonary emboli evidence of right heart strain consistent with "submassive "/intermediate PE.  -->  Code PE called.   DILATION AND CURETTAGE OF UTERUS     multiple about 6-7 times   ESOPHAGOGASTRODUODENOSCOPY     NM VQ LUNG SCAN ([facility] HX)  11/23/2018   Low probability for PE   RIGHT/LEFT HEART CATH AND CORONARY ANGIOGRAPHY N/A 08/19/2018   Procedure: RIGHT/LEFT HEART CATH AND CORONARY ANGIOGRAPHY;  Surgeon: Jettie Booze, MD;  Location: [place];  Service: Cardiovascular:: Nonobstructive CAD.  Mild pulmonary pretension.  Mean PA pressure 26 mmHg with PA P 48/20 mmHg.  Normal LVEDP.   THYROID SURGERY Right  2010   TOTAL HIP ARTHROPLASTY Left 2005   TOTAL KNEE ARTHROPLASTY Left 02/21/2014   Procedure: TOTAL KNEE ARTHROPLASTY;  Surgeon: Lorn Junes, MD;  Location: [place];  Service: Orthopedics;  Laterality: Left;   TRANSTHORACIC ECHOCARDIOGRAM  07/2018    (In setting of acute PE) mild LVH.  EF 55 to 60%.  GR 1 DD.  Aortic sclerosis but no stenosis.  Mild regurgitation.  Mild RA dilation.  Moderate LA dilation.  Moderate tricuspid regurgitation with severe primary hypertension estimated PA pressure 71 mmHg.  RV poorly visualized   TRANSTHORACIC ECHOCARDIOGRAM  10/2018 - 11/2019   a) EF 60-65%.  GR 1 DD.  Focal basal LVH.  Mod AI/ no AS. Mild LA dilation.  Normal RV size and fxn.  Mod PI w/ mild TR.  Peak PAP ~ 38 mmHg; b) Normal EF 60 to 65%.  Moderate LVH-GR 1 DD.  R WMA.  Mild AS w/ trivial AI.  Grossly normal PA, RV and RA size.  Normal RV function.  Normal PA pressures.   TRANSTHORACIC ECHOCARDIOGRAM  01/2021    EF 55%.  Normal LV function.  Mild LVH.  GR 1 DD.  Normal PA pressures.  Severe LA dilation.  (Consistent with more significant diastolic dysfunction).  Mild-possibly mild to moderate aortic insufficiency.  Borderline elevated RAP.  Trivial pericardial effusion.  (Stable)   VAGINAL HYSTERECTOMY  1979    MEDICATIONS:  acetaminophen (TYLENOL) 500 MG tablet   albuterol (VENTOLIN HFA) 108 (90 Base) MCG/ACT inhaler   apixaban (ELIQUIS) 5 MG TABS tablet   Ascorbic Acid (VITAMIN C) 1000 MG tablet   azaTHIOprine (IMURAN) 50 MG tablet   bimatoprost (LUMIGAN) 0.01 % SOLN   carvedilol (COREG) 6.25 MG tablet   Cholecalciferol (VITAMIN D3) 2000 UNITS TABS   COVID-19 mRNA vaccine, Moderna, 100 MCG/0.5ML injection   diltiazem (CARDIZEM LA) 360 MG 24 hr tablet   dorzolamide-timolol (COSOPT) 22.3-6.8 MG/ML ophthalmic solution   fenofibrate (TRICOR) 145 MG tablet   HUMIRA PEN 40 MG/0.4ML PNKT   ketorolac (ACULAR) 0.4 % SOLN   levothyroxine (SYNTHROID, LEVOTHROID) 50 MCG tablet   omeprazole  (PRILOSEC) 20 MG capsule   polyethylene glycol (MIRALAX / GLYCOLAX) 17 g packet   ramipril (ALTACE) 5 MG capsule   rosuvastatin (CRESTOR) 20 MG tablet   No current facility-administered medications for this encounter.    Konrad Felix Ward, PA-C [facility]-Surgical Testing [phone]

## 2022-01-02 NOTE — Telephone Encounter (Signed)
Returned call to pt she states that she does not need appt scheduled for this month she is having surgery and is cleared and would like to cancel because she does not think that this appt is needed. Scheduled f/u appt in may. She will be in at that appt.

## 2022-01-07 ENCOUNTER — Ambulatory Visit: Payer: Medicare HMO | Admitting: General Practice

## 2022-01-08 ENCOUNTER — Other Ambulatory Visit: Payer: Self-pay

## 2022-01-08 ENCOUNTER — Encounter (HOSPITAL_COMMUNITY)
Admission: RE | Admit: 2022-01-08 | Discharge: 2022-01-08 | Disposition: A | Discharge status: Home / Self Care | Payer: Medicare HMO | Source: Ambulatory Visit | Attending: Surgery | Admitting: Surgery

## 2022-01-08 DIAGNOSIS — Z01812 Encounter for preprocedural laboratory examination: Secondary | ICD-10-CM | POA: Insufficient documentation

## 2022-01-08 DIAGNOSIS — Z01818 Encounter for other preprocedural examination: Secondary | ICD-10-CM

## 2022-01-08 DIAGNOSIS — Z20822 Contact with and (suspected) exposure to covid-19: Secondary | ICD-10-CM | POA: Insufficient documentation

## 2022-01-08 LAB — SARS CORONAVIRUS 2 (TAT 6-24 HRS): SARS Coronavirus 2: NEGATIVE

## 2022-01-09 NOTE — Anesthesia Preprocedure Evaluation (Addendum)
Anesthesia Evaluation  Patient identified by MRN, date of birth, ID band Patient awake    Reviewed: Allergy & Precautions, H&P , NPO status , Patient's Chart, lab work & pertinent test results  History of Anesthesia Complications (+) PONV and history of anesthetic complications  Airway Mallampati: II  TM Distance: >3 FB Neck ROM: Full    Dental no notable dental hx. (+) Teeth Intact, Dental Advisory Given, Poor Dentition, Missing   Pulmonary asthma , former smoker,    Pulmonary exam normal breath sounds clear to auscultation       Cardiovascular hypertension, Pt. on medications Normal cardiovascular exam Rhythm:Regular Rate:Normal  ECHO 3/22 Echocardiogram results shows normal pump function with an ejection fraction of 55%. No wall motion normalities. Only grade 1 diastolic dysfunction. All this is very reassuring. Right ventricle looks good. The left atrium is severely dilated which would suggest that maybe the diastolic function is little worse -> would also be a set up for atrial fibrillation. There is also evidence of slightly increased right-sided pressures which does not go along with the right ventricular pressure assessment.  Cardiac Cath 6/80/8811 LV end diastolic pressure is normal. There is no aortic valve stenosis. Hemodynamic findings consistent with mild pulmonary hypertension. Nonobstructive CAD. CO 6.9 L/min; CI 3.6; PA 48/20, mean PA 26 mmHg; elevated RVSP 50 mm Hg. Ao sat 96%; PA sat 73%   Neuro/Psych    GI/Hepatic Neg liver ROS, GERD  Medicated and Controlled,  Endo/Other  diabetes, Type 2, Oral Hypoglycemic AgentsHypothyroidism Morbid obesity  Renal/GU      Musculoskeletal  (+) Arthritis , Osteoarthritis,    Abdominal   Peds  Hematology  (+) Blood dyscrasia, anemia ,   Anesthesia Other Findings   Reproductive/Obstetrics                            Anesthesia  Physical  Anesthesia Plan  ASA: 3  Anesthesia Plan: General   Post-op Pain Management:    Induction: Intravenous  PONV Risk Score and Plan: 4 or greater and Ondansetron, Dexamethasone, Midazolam and Treatment may vary due to age or medical condition  Airway Management Planned: Oral ETT  Additional Equipment:   Intra-op Plan:   Post-operative Plan: Extubation in OR  Informed Consent: I have reviewed the patients History and Physical, chart, labs and discussed the procedure including the risks, benefits and alternatives for the proposed anesthesia with the patient or authorized representative who has indicated his/her understanding and acceptance.       Plan Discussed with: CRNA and Anesthesiologist  Anesthesia Plan Comments: (DISCUSSION:76 y.o. former smoker with h/o PONV, GERD, HTN, DM II, CKD, PE, symptomatic biliary colic scheduled for above procedure 01/10/2022 with Dr. Michael Boston.   Per cardiology preoperative evaluation 11/15/21, "Chart reviewed as part of pre-operative protocol coverage. Given past medical history and time since last visit, based on ACC/AHA guidelines,Brittnye Ann Claytonwould be at acceptable risk for the planned procedure without further cardiovascular testing. )      Anesthesia Quick Evaluation

## 2022-01-10 ENCOUNTER — Inpatient Hospital Stay (HOSPITAL_COMMUNITY)
Admission: RE | Admit: 2022-01-10 | Discharge: 2022-01-15 | DRG: 419 | Disposition: A | Discharge status: Home / Self Care | Payer: Medicare HMO | Attending: Surgery | Admitting: Surgery

## 2022-01-10 ENCOUNTER — Ambulatory Visit (HOSPITAL_COMMUNITY): Payer: Medicare HMO

## 2022-01-10 ENCOUNTER — Ambulatory Visit (HOSPITAL_COMMUNITY): Payer: Medicare HMO | Admitting: Anesthesiology

## 2022-01-10 ENCOUNTER — Other Ambulatory Visit: Payer: Self-pay

## 2022-01-10 ENCOUNTER — Ambulatory Visit (HOSPITAL_BASED_OUTPATIENT_CLINIC_OR_DEPARTMENT_OTHER): Payer: Medicare HMO | Admitting: Anesthesiology

## 2022-01-10 ENCOUNTER — Encounter (HOSPITAL_COMMUNITY)
Admission: RE | Disposition: A | Discharge status: Home / Self Care | Payer: Self-pay | Source: Home / Self Care | Attending: Surgery

## 2022-01-10 ENCOUNTER — Encounter (HOSPITAL_COMMUNITY): Payer: Self-pay | Admitting: Surgery

## 2022-01-10 DIAGNOSIS — Z888 Allergy status to other drugs, medicaments and biological substances status: Secondary | ICD-10-CM

## 2022-01-10 DIAGNOSIS — K8010 Calculus of gallbladder with chronic cholecystitis without obstruction: Principal | ICD-10-CM | POA: Diagnosis present

## 2022-01-10 DIAGNOSIS — K660 Peritoneal adhesions (postprocedural) (postinfection): Secondary | ICD-10-CM

## 2022-01-10 DIAGNOSIS — R1310 Dysphagia, unspecified: Secondary | ICD-10-CM | POA: Diagnosis present

## 2022-01-10 DIAGNOSIS — G4733 Obstructive sleep apnea (adult) (pediatric): Secondary | ICD-10-CM | POA: Diagnosis present

## 2022-01-10 DIAGNOSIS — N3941 Urge incontinence: Secondary | ICD-10-CM | POA: Diagnosis present

## 2022-01-10 DIAGNOSIS — E11649 Type 2 diabetes mellitus with hypoglycemia without coma: Secondary | ICD-10-CM | POA: Diagnosis not present

## 2022-01-10 DIAGNOSIS — K1379 Other lesions of oral mucosa: Secondary | ICD-10-CM

## 2022-01-10 DIAGNOSIS — Z90710 Acquired absence of both cervix and uterus: Secondary | ICD-10-CM

## 2022-01-10 DIAGNOSIS — Z6833 Body mass index (BMI) 33.0-33.9, adult: Secondary | ICD-10-CM

## 2022-01-10 DIAGNOSIS — I10 Essential (primary) hypertension: Secondary | ICD-10-CM | POA: Diagnosis not present

## 2022-01-10 DIAGNOSIS — D72829 Elevated white blood cell count, unspecified: Secondary | ICD-10-CM

## 2022-01-10 DIAGNOSIS — Z833 Family history of diabetes mellitus: Secondary | ICD-10-CM

## 2022-01-10 DIAGNOSIS — Z9989 Dependence on other enabling machines and devices: Secondary | ICD-10-CM

## 2022-01-10 DIAGNOSIS — K7581 Nonalcoholic steatohepatitis (NASH): Secondary | ICD-10-CM | POA: Diagnosis not present

## 2022-01-10 DIAGNOSIS — Z993 Dependence on wheelchair: Secondary | ICD-10-CM

## 2022-01-10 DIAGNOSIS — Z20822 Contact with and (suspected) exposure to covid-19: Secondary | ICD-10-CM | POA: Diagnosis present

## 2022-01-10 DIAGNOSIS — Z79899 Other long term (current) drug therapy: Secondary | ICD-10-CM

## 2022-01-10 DIAGNOSIS — I071 Rheumatic tricuspid insufficiency: Secondary | ICD-10-CM | POA: Diagnosis present

## 2022-01-10 DIAGNOSIS — H5789 Other specified disorders of eye and adnexa: Secondary | ICD-10-CM | POA: Diagnosis present

## 2022-01-10 DIAGNOSIS — E65 Localized adiposity: Secondary | ICD-10-CM | POA: Diagnosis present

## 2022-01-10 DIAGNOSIS — N1832 Chronic kidney disease, stage 3b: Secondary | ICD-10-CM | POA: Diagnosis present

## 2022-01-10 DIAGNOSIS — Z96652 Presence of left artificial knee joint: Secondary | ICD-10-CM | POA: Diagnosis present

## 2022-01-10 DIAGNOSIS — K811 Chronic cholecystitis: Secondary | ICD-10-CM | POA: Diagnosis present

## 2022-01-10 DIAGNOSIS — Z419 Encounter for procedure for purposes other than remedying health state, unspecified: Secondary | ICD-10-CM

## 2022-01-10 DIAGNOSIS — K5909 Other constipation: Secondary | ICD-10-CM | POA: Diagnosis present

## 2022-01-10 DIAGNOSIS — Z87891 Personal history of nicotine dependence: Secondary | ICD-10-CM

## 2022-01-10 DIAGNOSIS — Z885 Allergy status to narcotic agent status: Secondary | ICD-10-CM

## 2022-01-10 DIAGNOSIS — Z8249 Family history of ischemic heart disease and other diseases of the circulatory system: Secondary | ICD-10-CM

## 2022-01-10 DIAGNOSIS — E11319 Type 2 diabetes mellitus with unspecified diabetic retinopathy without macular edema: Secondary | ICD-10-CM | POA: Diagnosis present

## 2022-01-10 DIAGNOSIS — I129 Hypertensive chronic kidney disease with stage 1 through stage 4 chronic kidney disease, or unspecified chronic kidney disease: Secondary | ICD-10-CM | POA: Diagnosis present

## 2022-01-10 DIAGNOSIS — G8929 Other chronic pain: Secondary | ICD-10-CM | POA: Diagnosis present

## 2022-01-10 DIAGNOSIS — E119 Type 2 diabetes mellitus without complications: Secondary | ICD-10-CM

## 2022-01-10 DIAGNOSIS — M1990 Unspecified osteoarthritis, unspecified site: Secondary | ICD-10-CM | POA: Diagnosis present

## 2022-01-10 DIAGNOSIS — M419 Scoliosis, unspecified: Secondary | ICD-10-CM | POA: Diagnosis present

## 2022-01-10 DIAGNOSIS — Z86711 Personal history of pulmonary embolism: Secondary | ICD-10-CM

## 2022-01-10 DIAGNOSIS — R011 Cardiac murmur, unspecified: Secondary | ICD-10-CM | POA: Diagnosis present

## 2022-01-10 DIAGNOSIS — K219 Gastro-esophageal reflux disease without esophagitis: Secondary | ICD-10-CM | POA: Diagnosis present

## 2022-01-10 DIAGNOSIS — E785 Hyperlipidemia, unspecified: Secondary | ICD-10-CM | POA: Diagnosis present

## 2022-01-10 DIAGNOSIS — R5383 Other fatigue: Secondary | ICD-10-CM | POA: Diagnosis present

## 2022-01-10 DIAGNOSIS — E669 Obesity, unspecified: Secondary | ICD-10-CM | POA: Diagnosis present

## 2022-01-10 DIAGNOSIS — H409 Unspecified glaucoma: Secondary | ICD-10-CM | POA: Diagnosis present

## 2022-01-10 DIAGNOSIS — G90A Postural orthostatic tachycardia syndrome (POTS): Secondary | ICD-10-CM | POA: Diagnosis present

## 2022-01-10 DIAGNOSIS — E039 Hypothyroidism, unspecified: Secondary | ICD-10-CM | POA: Diagnosis present

## 2022-01-10 DIAGNOSIS — Z96642 Presence of left artificial hip joint: Secondary | ICD-10-CM | POA: Diagnosis present

## 2022-01-10 DIAGNOSIS — E1122 Type 2 diabetes mellitus with diabetic chronic kidney disease: Secondary | ICD-10-CM | POA: Diagnosis present

## 2022-01-10 DIAGNOSIS — Z7901 Long term (current) use of anticoagulants: Secondary | ICD-10-CM

## 2022-01-10 DIAGNOSIS — I951 Orthostatic hypotension: Secondary | ICD-10-CM

## 2022-01-10 DIAGNOSIS — I2510 Atherosclerotic heart disease of native coronary artery without angina pectoris: Secondary | ICD-10-CM | POA: Diagnosis present

## 2022-01-10 HISTORY — PX: CHOLECYSTECTOMY: SHX55

## 2022-01-10 LAB — GLUCOSE, CAPILLARY
Glucose-Capillary: 91 mg/dL (ref 70–99)
Glucose-Capillary: 146 mg/dL — ABNORMAL HIGH (ref 70–99)

## 2022-01-10 SURGERY — LAPAROSCOPIC CHOLECYSTECTOMY WITH INTRAOPERATIVE CHOLANGIOGRAM
Anesthesia: General

## 2022-01-10 MED ORDER — ONDANSETRON HCL 4 MG/2ML IJ SOLN: 4.0000 mg | Freq: Four times a day (QID) | INTRAMUSCULAR | Status: DC | PRN

## 2022-01-10 MED ORDER — RAMIPRIL 5 MG PO CAPS
5.0000 mg | ORAL_CAPSULE | Freq: Every day | ORAL | Status: DC
  Administered 2022-01-10: 5 mg via ORAL
  Filled 2022-01-10: qty 1

## 2022-01-10 MED ORDER — ACETAMINOPHEN 160 MG/5ML PO SOLN: 325.0000 mg | ORAL | Status: DC | PRN

## 2022-01-10 MED ORDER — BISACODYL 10 MG RE SUPP: 10.0000 mg | Freq: Every day | RECTAL | Status: DC | PRN

## 2022-01-10 MED ORDER — GABAPENTIN 300 MG PO CAPS
300.0000 mg | ORAL_CAPSULE | ORAL | Status: AC
  Administered 2022-01-10: 300 mg via ORAL
  Filled 2022-01-10: qty 1

## 2022-01-10 MED ORDER — 0.9 % SODIUM CHLORIDE (POUR BTL) OPTIME
TOPICAL | Status: DC | PRN
  Administered 2022-01-10: 1000 mL

## 2022-01-10 MED ORDER — BUPIVACAINE LIPOSOME 1.3 % IJ SUSP
INTRAMUSCULAR | Status: DC | PRN
  Administered 2022-01-10: 20 mL

## 2022-01-10 MED ORDER — FENTANYL CITRATE PF 50 MCG/ML IJ SOSY
25.0000 ug | PREFILLED_SYRINGE | INTRAMUSCULAR | Status: DC | PRN
  Administered 2022-01-10: 25 ug via INTRAVENOUS

## 2022-01-10 MED ORDER — KETOROLAC TROMETHAMINE 0.5 % OP SOLN
1.0000 [drp] | Freq: Four times a day (QID) | OPHTHALMIC | Status: DC
  Administered 2022-01-10 – 2022-01-15 (×19): 1 [drp] via OPHTHALMIC
  Filled 2022-01-10: qty 5

## 2022-01-10 MED ORDER — ONDANSETRON 4 MG PO TBDP: 4.0000 mg | ORAL_TABLET | Freq: Four times a day (QID) | ORAL | Status: DC | PRN

## 2022-01-10 MED ORDER — MAGNESIUM HYDROXIDE 400 MG/5ML PO SUSP: 30.0000 mL | Freq: Every day | ORAL | Status: DC | PRN

## 2022-01-10 MED ORDER — SUGAMMADEX SODIUM 200 MG/2ML IV SOLN
INTRAVENOUS | Status: DC | PRN
  Administered 2022-01-10: 200 mg via INTRAVENOUS

## 2022-01-10 MED ORDER — OXYCODONE HCL 5 MG PO TABS: 5.0000 mg | ORAL_TABLET | Freq: Once | ORAL | Status: DC | PRN

## 2022-01-10 MED ORDER — PROPOFOL 10 MG/ML IV BOLUS
INTRAVENOUS | Status: DC | PRN
  Administered 2022-01-10: 120 mg via INTRAVENOUS

## 2022-01-10 MED ORDER — ROSUVASTATIN CALCIUM 20 MG PO TABS
20.0000 mg | ORAL_TABLET | Freq: Every day | ORAL | Status: DC
  Administered 2022-01-10 – 2022-01-14 (×5): 20 mg via ORAL
  Filled 2022-01-10 (×5): qty 1

## 2022-01-10 MED ORDER — ONDANSETRON HCL 4 MG/2ML IJ SOLN
INTRAMUSCULAR | Status: DC | PRN
  Administered 2022-01-10: 4 mg via INTRAVENOUS

## 2022-01-10 MED ORDER — ROCURONIUM BROMIDE 10 MG/ML (PF) SYRINGE
PREFILLED_SYRINGE | INTRAVENOUS | Status: AC
  Filled 2022-01-10: qty 10

## 2022-01-10 MED ORDER — LEVOTHYROXINE SODIUM 50 MCG PO TABS
50.0000 ug | ORAL_TABLET | Freq: Every day | ORAL | Status: DC
  Administered 2022-01-11 – 2022-01-15 (×5): 50 ug via ORAL
  Filled 2022-01-10 (×5): qty 1

## 2022-01-10 MED ORDER — ASCORBIC ACID 500 MG PO TABS
1000.0000 mg | ORAL_TABLET | Freq: Every day | ORAL | Status: DC
  Administered 2022-01-10 – 2022-01-15 (×6): 1000 mg via ORAL
  Filled 2022-01-10 (×6): qty 2

## 2022-01-10 MED ORDER — ENOXAPARIN SODIUM 40 MG/0.4ML IJ SOSY
40.0000 mg | PREFILLED_SYRINGE | INTRAMUSCULAR | Status: DC
  Administered 2022-01-11: 40 mg via SUBCUTANEOUS
  Filled 2022-01-10: qty 0.4

## 2022-01-10 MED ORDER — LIDOCAINE HCL (PF) 2 % IJ SOLN
INTRAMUSCULAR | Status: AC
  Filled 2022-01-10: qty 5

## 2022-01-10 MED ORDER — PROPOFOL 10 MG/ML IV BOLUS
INTRAVENOUS | Status: AC
  Filled 2022-01-10: qty 20

## 2022-01-10 MED ORDER — FENTANYL CITRATE (PF) 100 MCG/2ML IJ SOLN
INTRAMUSCULAR | Status: DC | PRN
  Administered 2022-01-10 (×2): 50 ug via INTRAVENOUS

## 2022-01-10 MED ORDER — DILTIAZEM HCL ER COATED BEADS 180 MG PO CP24
360.0000 mg | ORAL_CAPSULE | Freq: Every day | ORAL | Status: DC
Start: 2022-01-11 — End: 2022-01-12
  Administered 2022-01-11: 360 mg via ORAL
  Filled 2022-01-10: qty 2
  Filled 2022-01-10: qty 1
  Filled 2022-01-10: qty 2

## 2022-01-10 MED ORDER — ACETAMINOPHEN 500 MG PO TABS
1000.0000 mg | ORAL_TABLET | Freq: Four times a day (QID) | ORAL | Status: DC
  Administered 2022-01-10 – 2022-01-15 (×18): 1000 mg via ORAL
  Filled 2022-01-10 (×19): qty 2

## 2022-01-10 MED ORDER — GABAPENTIN 300 MG PO CAPS: 300.0000 mg | ORAL_CAPSULE | Freq: Two times a day (BID) | ORAL | Status: DC

## 2022-01-10 MED ORDER — DIPHENHYDRAMINE HCL 12.5 MG/5ML PO ELIX: 12.5000 mg | ORAL_SOLUTION | Freq: Four times a day (QID) | ORAL | Status: DC | PRN

## 2022-01-10 MED ORDER — OXYCODONE HCL 5 MG PO TABS: 5.0000 mg | ORAL_TABLET | ORAL | Status: DC | PRN

## 2022-01-10 MED ORDER — FENTANYL CITRATE PF 50 MCG/ML IJ SOSY
PREFILLED_SYRINGE | INTRAMUSCULAR | Status: AC
  Filled 2022-01-10: qty 2

## 2022-01-10 MED ORDER — DORZOLAMIDE HCL-TIMOLOL MAL 2-0.5 % OP SOLN
1.0000 [drp] | Freq: Two times a day (BID) | OPHTHALMIC | Status: DC
  Administered 2022-01-10 – 2022-01-15 (×11): 1 [drp] via OPHTHALMIC
  Filled 2022-01-10: qty 10

## 2022-01-10 MED ORDER — METHOCARBAMOL 500 MG PO TABS: 500.0000 mg | ORAL_TABLET | Freq: Four times a day (QID) | ORAL | Status: DC | PRN

## 2022-01-10 MED ORDER — BUPIVACAINE-EPINEPHRINE 0.25% -1:200000 IJ SOLN
INTRAMUSCULAR | Status: DC | PRN
  Administered 2022-01-10: 30 mL

## 2022-01-10 MED ORDER — BUPIVACAINE LIPOSOME 1.3 % IJ SUSP
INTRAMUSCULAR | Status: AC
  Filled 2022-01-10: qty 20

## 2022-01-10 MED ORDER — OXYCODONE HCL 5 MG/5ML PO SOLN: 5.0000 mg | Freq: Once | ORAL | Status: DC | PRN

## 2022-01-10 MED ORDER — CHLORHEXIDINE GLUCONATE 0.12 % MT SOLN
15.0000 mL | Freq: Once | OROMUCOSAL | Status: AC
  Administered 2022-01-10: 15 mL via OROMUCOSAL

## 2022-01-10 MED ORDER — DEXAMETHASONE SODIUM PHOSPHATE 10 MG/ML IJ SOLN
INTRAMUSCULAR | Status: AC
  Filled 2022-01-10: qty 1

## 2022-01-10 MED ORDER — MIDAZOLAM HCL 2 MG/2ML IJ SOLN
INTRAMUSCULAR | Status: AC
  Filled 2022-01-10: qty 2

## 2022-01-10 MED ORDER — FENTANYL CITRATE (PF) 100 MCG/2ML IJ SOLN
INTRAMUSCULAR | Status: AC
  Filled 2022-01-10: qty 2

## 2022-01-10 MED ORDER — ACETAMINOPHEN 500 MG PO TABS
1000.0000 mg | ORAL_TABLET | ORAL | Status: AC
  Administered 2022-01-10: 1000 mg via ORAL
  Filled 2022-01-10: qty 2

## 2022-01-10 MED ORDER — ROCURONIUM BROMIDE 10 MG/ML (PF) SYRINGE
PREFILLED_SYRINGE | INTRAVENOUS | Status: DC | PRN
  Administered 2022-01-10: 70 mg via INTRAVENOUS

## 2022-01-10 MED ORDER — PROCHLORPERAZINE EDISYLATE 10 MG/2ML IJ SOLN: 5.0000 mg | Freq: Four times a day (QID) | INTRAMUSCULAR | Status: DC | PRN

## 2022-01-10 MED ORDER — HYDROMORPHONE HCL 1 MG/ML IJ SOLN: 0.5000 mg | INTRAMUSCULAR | Status: DC | PRN

## 2022-01-10 MED ORDER — PHENYLEPHRINE HCL-NACL 20-0.9 MG/250ML-% IV SOLN
INTRAVENOUS | Status: AC
  Filled 2022-01-10: qty 250

## 2022-01-10 MED ORDER — CARVEDILOL 6.25 MG PO TABS
6.2500 mg | ORAL_TABLET | Freq: Two times a day (BID) | ORAL | Status: DC
  Administered 2022-01-10 – 2022-01-13 (×6): 6.25 mg via ORAL
  Filled 2022-01-10 (×5): qty 1

## 2022-01-10 MED ORDER — BUPIVACAINE-EPINEPHRINE (PF) 0.25% -1:200000 IJ SOLN
INTRAMUSCULAR | Status: AC
  Filled 2022-01-10: qty 30

## 2022-01-10 MED ORDER — EPHEDRINE SULFATE-NACL 50-0.9 MG/10ML-% IV SOSY
PREFILLED_SYRINGE | INTRAVENOUS | Status: DC | PRN
  Administered 2022-01-10: 10 mg via INTRAVENOUS

## 2022-01-10 MED ORDER — CHLORHEXIDINE GLUCONATE CLOTH 2 % EX PADS: 6.0000 | MEDICATED_PAD | Freq: Once | CUTANEOUS | Status: DC

## 2022-01-10 MED ORDER — ONDANSETRON HCL 4 MG PO TABS: 4.0000 mg | ORAL_TABLET | Freq: Three times a day (TID) | ORAL | 5 refills | Status: DC | PRN

## 2022-01-10 MED ORDER — SODIUM CHLORIDE 0.9 % IV SOLN: Freq: Three times a day (TID) | INTRAVENOUS | Status: DC | PRN

## 2022-01-10 MED ORDER — ONDANSETRON HCL 4 MG/2ML IJ SOLN
INTRAMUSCULAR | Status: AC
  Filled 2022-01-10: qty 2

## 2022-01-10 MED ORDER — TRAMADOL HCL 50 MG PO TABS: 50.0000 mg | ORAL_TABLET | Freq: Four times a day (QID) | ORAL | 0 refills | Status: DC | PRN

## 2022-01-10 MED ORDER — ORAL CARE MOUTH RINSE: 15.0000 mL | Freq: Once | OROMUCOSAL | Status: AC

## 2022-01-10 MED ORDER — LATANOPROST 0.005 % OP SOLN
1.0000 [drp] | Freq: Every day | OPHTHALMIC | Status: DC
Start: 2022-01-10 — End: 2022-01-15
  Administered 2022-01-10 – 2022-01-14 (×5): 1 [drp] via OPHTHALMIC
  Filled 2022-01-10: qty 2.5

## 2022-01-10 MED ORDER — METOPROLOL TARTRATE 5 MG/5ML IV SOLN: 5.0000 mg | Freq: Four times a day (QID) | INTRAVENOUS | Status: DC | PRN

## 2022-01-10 MED ORDER — BUPIVACAINE LIPOSOME 1.3 % IJ SUSP: 20.0000 mL | Freq: Once | INTRAMUSCULAR | Status: DC

## 2022-01-10 MED ORDER — DROPERIDOL 2.5 MG/ML IJ SOLN
INTRAMUSCULAR | Status: DC | PRN
  Administered 2022-01-10: .625 mg via INTRAVENOUS

## 2022-01-10 MED ORDER — ALBUTEROL SULFATE (2.5 MG/3ML) 0.083% IN NEBU: 3.0000 mL | INHALATION_SOLUTION | Freq: Four times a day (QID) | RESPIRATORY_TRACT | Status: DC | PRN

## 2022-01-10 MED ORDER — LACTATED RINGERS IV SOLN: INTRAVENOUS | Status: DC

## 2022-01-10 MED ORDER — ENSURE PRE-SURGERY PO LIQD: 296.0000 mL | Freq: Once | ORAL | Status: DC

## 2022-01-10 MED ORDER — TRAMADOL HCL 50 MG PO TABS: 50.0000 mg | ORAL_TABLET | Freq: Four times a day (QID) | ORAL | Status: DC | PRN

## 2022-01-10 MED ORDER — RAMIPRIL 2.5 MG PO CAPS
2.5000 mg | ORAL_CAPSULE | Freq: Every day | ORAL | Status: DC
  Filled 2022-01-10 (×2): qty 1

## 2022-01-10 MED ORDER — SODIUM CHLORIDE 0.9 % IV SOLN
2.0000 g | INTRAVENOUS | Status: AC
  Administered 2022-01-10: 2 g via INTRAVENOUS
  Filled 2022-01-10: qty 20

## 2022-01-10 MED ORDER — FENOFIBRATE 160 MG PO TABS
160.0000 mg | ORAL_TABLET | Freq: Every day | ORAL | Status: DC
  Administered 2022-01-10 – 2022-01-15 (×6): 160 mg via ORAL
  Filled 2022-01-10 (×6): qty 1

## 2022-01-10 MED ORDER — VITAMIN D3 25 MCG (1000 UNIT) PO TABS
2000.0000 [IU] | ORAL_TABLET | Freq: Every day | ORAL | Status: DC
  Administered 2022-01-10 – 2022-01-15 (×6): 2000 [IU] via ORAL
  Filled 2022-01-10 (×6): qty 2

## 2022-01-10 MED ORDER — SIMETHICONE 80 MG PO CHEW: 40.0000 mg | CHEWABLE_TABLET | Freq: Four times a day (QID) | ORAL | Status: DC | PRN

## 2022-01-10 MED ORDER — MEPERIDINE HCL 50 MG/ML IJ SOLN: 6.2500 mg | INTRAMUSCULAR | Status: DC | PRN

## 2022-01-10 MED ORDER — IOHEXOL 300 MG/ML  SOLN
INTRAMUSCULAR | Status: DC | PRN
  Administered 2022-01-10: 7 mL

## 2022-01-10 MED ORDER — DROPERIDOL 2.5 MG/ML IJ SOLN
INTRAMUSCULAR | Status: AC
  Filled 2022-01-10: qty 2

## 2022-01-10 MED ORDER — ONDANSETRON HCL 4 MG/2ML IJ SOLN: 4.0000 mg | Freq: Once | INTRAMUSCULAR | Status: DC | PRN

## 2022-01-10 MED ORDER — DIPHENHYDRAMINE HCL 50 MG/ML IJ SOLN: 12.5000 mg | Freq: Four times a day (QID) | INTRAMUSCULAR | Status: DC | PRN

## 2022-01-10 MED ORDER — PROCHLORPERAZINE MALEATE 10 MG PO TABS
10.0000 mg | ORAL_TABLET | Freq: Four times a day (QID) | ORAL | Status: DC | PRN
  Filled 2022-01-10: qty 1

## 2022-01-10 MED ORDER — ACETAMINOPHEN 325 MG PO TABS: 325.0000 mg | ORAL_TABLET | ORAL | Status: DC | PRN

## 2022-01-10 MED ORDER — LIDOCAINE 2% (20 MG/ML) 5 ML SYRINGE
INTRAMUSCULAR | Status: DC | PRN
  Administered 2022-01-10: 60 mg via INTRAVENOUS

## 2022-01-10 MED ORDER — DEXAMETHASONE SODIUM PHOSPHATE 10 MG/ML IJ SOLN
INTRAMUSCULAR | Status: DC | PRN
  Administered 2022-01-10: 5 mg via INTRAVENOUS

## 2022-01-10 SURGICAL SUPPLY — 44 items
APPLIER CLIP 5 13 M/L LIGAMAX5 (MISCELLANEOUS) ×2
BAG COUNTER SPONGE SURGICOUNT (BAG) IMPLANT
CABLE HIGH FREQUENCY MONO STRZ (ELECTRODE) ×2 IMPLANT
CHLORAPREP W/TINT 26 (MISCELLANEOUS) ×2 IMPLANT
CLIP APPLIE 5 13 M/L LIGAMAX5 (MISCELLANEOUS) ×1 IMPLANT
COVER MAYO STAND STRL (DRAPES) ×2 IMPLANT
COVER SURGICAL LIGHT HANDLE (MISCELLANEOUS) ×2 IMPLANT
DRAIN CHANNEL 19F RND (DRAIN) IMPLANT
DRAPE C-ARM 42X120 X-RAY (DRAPES) ×2 IMPLANT
DRAPE WARM FLUID 44X44 (DRAPES) ×2 IMPLANT
DRSG TEGADERM 4X4.75 (GAUZE/BANDAGES/DRESSINGS) ×2 IMPLANT
ELECT REM PT RETURN 15FT ADLT (MISCELLANEOUS) ×2 IMPLANT
ENDOLOOP SUT PDS II  0 18 (SUTURE) ×1
ENDOLOOP SUT PDS II 0 18 (SUTURE) IMPLANT
EVACUATOR SILICONE 100CC (DRAIN) IMPLANT
GAUZE SPONGE 2X2 8PLY STRL LF (GAUZE/BANDAGES/DRESSINGS) ×1 IMPLANT
GLOVE SURG NEOPR MICRO LF SZ8 (GLOVE) ×2 IMPLANT
GLOVE SURG UNDER LTX SZ8 (GLOVE) ×2 IMPLANT
GOWN STRL REUS W/TWL XL LVL3 (GOWN DISPOSABLE) ×4 IMPLANT
IRRIG SUCT STRYKERFLOW 2 WTIP (MISCELLANEOUS) ×2
IRRIGATION SUCT STRKRFLW 2 WTP (MISCELLANEOUS) ×1 IMPLANT
KIT BASIN OR (CUSTOM PROCEDURE TRAY) ×2 IMPLANT
KIT TURNOVER KIT A (KITS) IMPLANT
PAD POSITIONING PINK XL (MISCELLANEOUS) ×2 IMPLANT
PENCIL SMOKE EVACUATOR (MISCELLANEOUS) IMPLANT
POUCH RETRIEVAL ECOSAC 10 (ENDOMECHANICALS) IMPLANT
POUCH RETRIEVAL ECOSAC 10MM (ENDOMECHANICALS) ×1
PROTECTOR NERVE ULNAR (MISCELLANEOUS) IMPLANT
SCISSORS LAP 5X35 DISP (ENDOMECHANICALS) ×2 IMPLANT
SET CHOLANGIOGRAPH MIX (MISCELLANEOUS) ×2 IMPLANT
SET TUBE SMOKE EVAC HIGH FLOW (TUBING) ×2 IMPLANT
SHEARS HARMONIC ACE PLUS 36CM (ENDOMECHANICALS) ×2 IMPLANT
SLEEVE XCEL OPT CAN 5 100 (ENDOMECHANICALS) ×2 IMPLANT
SPIKE FLUID TRANSFER (MISCELLANEOUS) ×2 IMPLANT
SPONGE GAUZE 2X2 STER 10/PKG (GAUZE/BANDAGES/DRESSINGS) ×1
SUT MNCRL AB 4-0 PS2 18 (SUTURE) ×2 IMPLANT
SUT PDS AB 1 CT1 27 (SUTURE) ×5 IMPLANT
SYR 20ML LL LF (SYRINGE) ×2 IMPLANT
TOWEL OR 17X26 10 PK STRL BLUE (TOWEL DISPOSABLE) ×2 IMPLANT
TOWEL OR NON WOVEN STRL DISP B (DISPOSABLE) ×2 IMPLANT
TRAY LAPAROSCOPIC (CUSTOM PROCEDURE TRAY) ×2 IMPLANT
TROCAR BLADELESS OPT 5 100 (ENDOMECHANICALS) ×2 IMPLANT
TROCAR BLADELESS OPT 5 150 (ENDOMECHANICALS) ×2 IMPLANT
TROCAR XCEL NON-BLD 11X100MML (ENDOMECHANICALS) ×1 IMPLANT

## 2022-01-10 NOTE — Interval H&P Note (Signed)
History and Physical Interval Note:  01/10/2022 7:27 AM  Tricia Harrison  has presented today for surgery, with the diagnosis of SYMPTOMATIC BILIARY COLIC, PROBABLE CHRONIC CHOLECYSTITIS.  The various methods of treatment have been discussed with the patient and family. After consideration of risks, benefits and other options for treatment, the patient has consented to  Procedure(s): [place] CHOLANGIOGRAM (N/A) as a surgical intervention.  The patient's history has been reviewed, patient examined, no change in status, stable for surgery.  I have reviewed the patient's chart and labs.  Questions were answered to the patient's satisfaction.    The anatomy & physiology of hepatobiliary & pancreatic function was discussed.  The pathophysiology of gallbladder dysfunction was discussed.  Natural history risks without surgery was discussed.   I feel the risks of no intervention will lead to serious problems that outweigh the operative risks; therefore, I recommended cholecystectomy to remove the pathology.  I explained laparoscopic techniques with possible need for an open approach.  Probable cholangiogram to evaluate the bilary tract was explained as well.    Risks such as bleeding, infection, diarrhea and other bowel changes, abscess, leak, injury to other organs, need for repair of tissues / organs, need for further treatment, stroke, heart attack, death, and other risks were discussed.  I noted a good likelihood this will help address the problem, but there is a chance it may not help.  Possibility that this will not correct all abdominal symptoms was explained.  Goals of post-operative recovery were discussed as well.  We will work to minimize complications.  An educational handout further explaining the pathology and treatment options was given as well.  Questions were answered.  The patient expresses understanding & wishes to proceed with  surgery.   I have re-reviewed the the patient's records, history, medications, and allergies.  I have re-examined the patient.  I again discussed intraoperative plans and goals of post-operative recovery.  The patient agrees to proceed.  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   Bilateral ovarian cysts    Hx of gallstones    Hernia, hiatal    Renal cysts, acquired, bilateral    GERD (gastroesophageal reflux disease)    Diverticulosis    Lumbar spinal stenosis    Diabetes mellitus without complication ([place])    Left knee DJD    Rapid palpitations 12/06/2013   Morbid obesity ([place]) 12/06/2013   Fatigue 12/06/2013   PVC (premature ventricular contraction) 12/06/2013   Essential hypertension    Hypothyroidism    Hyperlipidemia LDL goal <100     Past Medical History:  Diagnosis Date   Anemia    as a child   Asthma    as a child   Chronic back pain    spinal stenosis and buldging disc;scoliosis   Chronic constipation    occasionally takes something OTC   Chronic kidney disease    Diabetes mellitus without complication ([place])    per pt "Pre"  for 20+yrs   Diverticulosis    DVT (deep venous thrombosis) ([facility])    Gallstones    has known about this for 3-13yrs   GERD (gastroesophageal reflux disease)    takes Omeprazole daily   H/O hiatal hernia    Heart murmur    Hemorrhoids    History of blood transfusion    no abnormal  reaction noted   History of bronchitis    states its been a long time ago   History of gout    HLD (hyperlipidemia)    takes Fenofibrate daily   HTN (hypertension)    takes Diltiazem and Ramipril daily   Hypothyroidism    takes Synthroid daily   Left knee DJD    Nocturia    Palpitations    started in 2013 and only occasionally;last time noticed about 2-3wks ago and after drinking caffeine   PONV (postoperative nausea and vomiting)    Pulmonary embolism with acute cor pulmonale ([place]) 07/2018   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    Past Surgical History:  Procedure Laterality Date   3-day EVENT MONITOR  01/2019   Mostly normal monitor.  Predominantly sinus rhythm (rate range 48-105 bpm, average 67 bpm) 9 short atrial runs (fastest = 13 beats rate 135 bpm, longest ~17 seconds-101 bpm) -> not noted on symptom log.  No sustained arrhythmias (tachycardia or bradycardia), or pauses..  Symptoms are noted with PVCs.   COLONOSCOPY     CT ANGIOGRAM OF CHEST - PE PROTOCOL  07/2018   Bilateral nonocclusive pulmonary emboli evidence of right heart strain consistent with "submassive "/intermediate PE.  -->  Code PE called.   DILATION AND CURETTAGE OF UTERUS     multiple about 6-7 times   ESOPHAGOGASTRODUODENOSCOPY     NM VQ LUNG SCAN ([facility] HX)  11/23/2018   Low probability for PE   RIGHT/LEFT HEART CATH AND CORONARY ANGIOGRAPHY N/A 08/19/2018   Procedure: RIGHT/LEFT HEART CATH AND CORONARY ANGIOGRAPHY;  Surgeon: Jettie Booze, MD;  Location: [facility];  Service: Cardiovascular:: Nonobstructive CAD.  Mild pulmonary pretension.  Mean PA pressure 26 mmHg with PA P 48/20 mmHg.  Normal LVEDP.   THYROID SURGERY Right 2010   TOTAL HIP ARTHROPLASTY Left 2005   TOTAL KNEE ARTHROPLASTY Left 02/21/2014   Procedure: TOTAL KNEE ARTHROPLASTY;  Surgeon: Lorn Junes, MD;  Location: [place];  Service: Orthopedics;  Laterality:  Left;   TRANSTHORACIC ECHOCARDIOGRAM  07/2018    (In setting of acute PE) mild LVH.  EF 55 to 60%.  GR 1 DD.  Aortic sclerosis but no stenosis.  Mild regurgitation.  Mild RA dilation.  Moderate LA dilation.  Moderate tricuspid regurgitation with severe primary hypertension estimated PA pressure 71 mmHg.  RV poorly visualized   TRANSTHORACIC ECHOCARDIOGRAM  10/2018 - 11/2019   a) EF 60-65%.  GR 1 DD.  Focal basal LVH. Mod AI/ no AS. Mild LA dilation.  Normal RV size and fxn.  Mod PI w/ mild TR.  Peak PAP ~ 38 mmHg; b) Normal EF 60 to 65%.  Moderate LVH-GR 1 DD.  R WMA.  Mild AS w/ trivial AI.  Grossly normal PA, RV and RA size.  Normal RV function.  Normal PA pressures.   TRANSTHORACIC ECHOCARDIOGRAM  01/2021    EF 55%.  Normal LV function.  Mild LVH.  GR 1 DD.  Normal PA pressures.  Severe LA  dilation.  (Consistent with more significant diastolic dysfunction).  Mild-possibly mild to moderate aortic insufficiency.  Borderline elevated RAP.  Trivial pericardial effusion.  (Stable)   VAGINAL HYSTERECTOMY  1979    Social History   Socioeconomic History   Marital status: Single    Spouse name: Not on file   Number of children: Not on file   Years of education: Not on file   Highest education level: Not on file  Occupational History   Occupation: Retired  Tobacco Use   Smoking status: Former    Packs/day: 0.50    Years: 20.00    Pack years: 10.00    Types: Cigarettes    Start date: 09/04/1964    Quit date: 11/25/1993    Years since quitting: 28.1   Smokeless tobacco: Never   Tobacco comments:    quit smoking in 1995  Vaping Use   Vaping Use: Never used  Substance and Sexual Activity   Alcohol use: Yes    Comment: Socially.   Drug use: No   Sexual activity: Not on file  Other Topics Concern   Not on file  Social History Narrative   Pt lives w/ sister and nephew.   Social Determinants of Health   Financial Resource Strain: Not on file  Food Insecurity: Not on file  Transportation  Needs: Not on file  Physical Activity: Not on file  Stress: Not on file  Social Connections: Not on file  Intimate Partner Violence: Not on file    Family History  Problem Relation Age of Onset   Uterine cancer Mother    Heart disease Mother    Hypertension Mother    Diabetes Mother    Cirrhosis Father    Pneumonia Father    Lymphoma Sister        ,non hodgkin    Diabetes Sister    Breast cancer Neg Hx     Medications Prior to Admission  Medication Sig Dispense Refill Last Dose   acetaminophen (TYLENOL) 500 MG tablet Take 1,000 mg by mouth every 6 (six) hours as needed for mild pain.   Past Week   albuterol (VENTOLIN HFA) 108 (90 Base) MCG/ACT inhaler Inhale 2 puffs into the lungs every 6 (six) hours as needed. 8 g 2 Past Week   apixaban (ELIQUIS) 5 MG TABS tablet TAKE 1 TABLET BY MOUTH  TWICE DAILY 180 tablet 1 01/06/2022   Ascorbic Acid (VITAMIN C) 1000 MG tablet Take 1,000 mg by mouth daily.   01/09/2022   azaTHIOprine (IMURAN) 50 MG tablet Take 50 mg by mouth daily.   01/09/2022   bimatoprost (LUMIGAN) 0.01 % SOLN Place 1 drop into both eyes at bedtime.   01/09/2022   carvedilol (COREG) 6.25 MG tablet TAKE 1 TABLET BY MOUTH TWO  TIMES DAILY WITH A MEAL 180 tablet 3 01/10/2022 at 0430   Cholecalciferol (VITAMIN D3) 2000 UNITS TABS Take 2,000 Units by mouth daily.   01/09/2022   diltiazem (CARDIZEM LA) 360 MG 24 hr tablet Take 360 mg by mouth daily.   01/10/2022 at 0430   dorzolamide-timolol (COSOPT) 22.3-6.8 MG/ML ophthalmic solution Place 1 drop into both eyes 2 times daily.   01/09/2022   fenofibrate (TRICOR) 145 MG tablet Take 145 mg by mouth daily.   01/09/2022   HUMIRA PEN 40 MG/0.4ML PNKT Inject 40 mg into the skin every 14 (fourteen) days.   Past Month   ketorolac (ACULAR) 0.4 % SOLN Place 1 drop into both eyes 4 (four) times daily.  01/09/2022   levothyroxine (SYNTHROID, LEVOTHROID) 50 MCG tablet Take 50 mcg by mouth daily before breakfast.   01/10/2022 at 0430   omeprazole  (PRILOSEC) 20 MG capsule Take 20 mg by mouth daily.   01/09/2022   polyethylene glycol (MIRALAX / GLYCOLAX) 17 g packet Take 17 g by mouth daily as needed for mild constipation.   01/09/2022   ramipril (ALTACE) 5 MG capsule Take 1 capsule (5 mg total) by mouth daily. (Patient taking differently: Take 5 mg by mouth daily. Pt. Is taking 2.5 Daily) 90 capsule 1 Past Week   rosuvastatin (CRESTOR) 20 MG tablet TAKE 1 TABLET BY MOUTH  DAILY AT 6 PM. 90 tablet 3 Past Week   COVID-19 mRNA vaccine, Moderna, 100 MCG/0.5ML injection Inject into the muscle. 0.5 mL 0     Current Facility-Administered Medications  Medication Dose Route Frequency Provider Last Rate Last Admin   bupivacaine liposome (EXPAREL) 1.3 % injection 266 mg  20 mL Infiltration Once Michael Boston, MD       cefTRIAXone (ROCEPHIN) 2 g in sodium chloride 0.9 % 100 mL IVPB  2 g Intravenous On Call to OR Michael Boston, MD       Chlorhexidine Gluconate Cloth 2 % PADS 6 each  6 each Topical Once Michael Boston, MD       droperidol (INAPSINE) 2.5 MG/ML injection            lactated ringers infusion   Intravenous Continuous Lynda Rainwater, MD 10 mL/hr at 01/10/22 [phone] Continued from Pre-op at 01/10/22 8588   phenylephrine (NEOSYNEPHRINE) 20-0.9 MG/250ML-% infusion              Allergies  Allergen Reactions   Atorvastatin Other (See Comments)    Muscle pain   Codeine Nausea And Vomiting   Morphine And Related     B/p drops     BP (!) 161/75    Pulse (!) 56    Temp 98.6 F (37 C) (Oral)    Resp 16    SpO2 99%   Labs: Results for orders placed or performed during the hospital encounter of 01/10/22 (from the past 48 hour(s))  Glucose, capillary     Status: None   Collection Time: 01/10/22  5:41 AM  Result Value Ref Range   Glucose-Capillary 91 70 - 99 mg/dL    Comment: Glucose reference range applies only to samples taken after fasting for at least 8 hours.    Imaging / Studies: No results found.   Adin Hector, M.D.,  F.A.C.S. Gastrointestinal and Minimally Invasive Surgery [facility] [address] [place], [place] [address] [phone] Main / Paging  01/10/2022 7:28 AM    Adin Hector

## 2022-01-10 NOTE — H&P (Signed)
01/10/2022   REFERRING PHYSICIAN: Magod, Glade Stanford, MD  Patient Care Team: Lujean Amel, MD as PCP - General (Family Medicine) Magod, Glade Stanford, MD (Gastroenterology) Rolland Porter, MD (Cardiovascular Disease) Brand Males, MD (Pulmonary Disease)  PROVIDER: Hollace Kinnier, MD  [facility] MRN: [id number] DOB: [birth year] DATE OF ENCOUNTER: 11/13/2021  SUBJECTIVE   Chief Complaint: Cholelithiasis   History of Present Illness: Tricia Harrison is a 76 y.o. female who is seen today  as an office consultation at the request of Dr. Watt Climes  for evaluation of Cholelithiasis .   Pleasant woman with many health issues. Comes in with her son. I had operated on her sister. She is on anticoagulant Eliquis for PEs. Uses a rolling walker occasionally but prefers to be in wheelchair mode. She has severe spine and scoliosis issues. Saw neurosurgery. Dr. Vertell Limber recommended holding off on any operations and try to do physical therapy. She can walk a block or 2 with the help of a rolling walker but prefers to be pushed in a wheelchair when she is on the go. She has rather severe constipation and needs something to get her bowels to move. Followed by Dr. Watt Climes with [facility] gastroenterology. Recently had a colonoscopy. Records of that not available but she does not recall any major issues. She has chronic heartburn for which she takes a proton pump inhibitor.  Her main issue is that she has had episodes of upper abdominal pain occasionally going to her right side. Triggered by eating fried foods or foods with cream sauces. Gets bloated and nauseated. Ultrasound confirmed that she had a large gallstone. Surgical consultation offered. She takes MiraLAX sparingly to move her bowels but feels somewhat bloated. She is on an antispasmodic now. Goes a couple times a week with this regimen.  No new events - ready for surgery    Medical History:  Past Medical History:  Diagnosis Date   Anemia    Anxiety   Arthritis   Asthma, unspecified asthma severity, unspecified whether complicated, unspecified whether persistent   Chronic kidney disease   DVT (deep venous thrombosis) (CMS-HCC)   Glaucoma (increased eye pressure)   Hyperlipidemia   Hypertension   Pulmonary hypertension (CMS-HCC)   Sleep apnea   Thyroid disease   There is no problem list on file for this patient.  Past Surgical History:  Procedure Laterality Date   HYSTERECTOMY   left hip replacement Left   left knee replacement Left   right thyroid removed Right    Allergies  Allergen Reactions   Atorvastatin Other (See Comments)  Muscle pain Muscle pain   Codeine Nausea And Vomiting   Morphine Swelling  B/p drops   Current Outpatient Medications on File Prior to Visit  Medication Sig Dispense Refill   apixaban (ELIQUIS) 5 mg tablet Take 1 tablet (5 mg total) by mouth 2 (two) times daily   azaTHIOprine (IMURAN) 50 mg tablet 1 and1/2 tablet   carvediloL (COREG) 6.25 MG tablet TAKE 1 TABLET BY MOUTH TWICE A DAY WITH A MEAL FOR 30 DAYS   diltiazem (CARDIZEM LA) 360 mg LA tablet TAKE 1 TABLET BY MOUTH ONCE DAILY AT THE SAME TIME DAILY   ramipriL (ALTACE) 5 MG capsule   rosuvastatin (CRESTOR) 20 MG tablet   No current facility-administered medications on file prior to visit.   No family history on file.   Social History   Tobacco Use  Smoking Status Not on file  Smokeless Tobacco Not on file    Social History  Socioeconomic History   Marital status: Single   ############################################################  Review of Systems: A complete review of systems (ROS) was obtained from the patient. I have reviewed this information and discussed as appropriate with the patient. See HPI as well for other pertinent ROS.  Constitutional: No fevers, chills, sweats. Weight stable Eyes: No vision changes, No discharge HENT: No sore throats, nasal drainage Lymph: No neck swelling, No bruising  easily Pulmonary: No cough, productive sputum CV: No orthopnea, PND Patient walks 5 minutes with rolling walker. No exertional chest/neck/shoulder/arm pain.  GI: No personal nor family history of GI/colon cancer, inflammatory bowel disease, irritable bowel syndrome, allergy such as Celiac Sprue, dietary/dairy problems, colitis, ulcers nor gastritis. No recent sick contacts/gastroenteritis. No travel outside the country. No changes in diet.  Renal: No UTIs, No hematuria Genital: No drainage, bleeding, masses Musculoskeletal: No severe joint pain. Good ROM major joints Skin: No sores or lesions Heme/Lymph: No easy bleeding. No swollen lymph nodes  OBJECTIVE   Vitals:  11/13/21 0918  BP: (!) 142/86  Pulse: 93  Temp: 36.3 C (97.3 F)  SpO2: 100%  Weight: 79.3 kg (174 lb 12.8 oz)  Height: 154.9 cm (5\' 1" )  Body mass index is 33.03 kg/m.   BP (!) 161/75    Pulse (!) 56    Temp 98.6 F (37 C) (Oral)    Resp 16    SpO2 99%  01/10/2022   PHYSICAL EXAM:  Constitutional: Not cachectic. Hygeine adequate. Vitals signs as above.  Eyes: Pupils reactive, normal extraocular movements. Sclera nonicteric Neuro: CN II-XII intact. No major focal sensory defects. No major motor deficits. Lymph: No head/neck/groin lymphadenopathy Psych: No severe agitation. No severe anxiety. Judgment & insight Adequate, Oriented x4, HENT: Normocephalic, Mucus membranes moist. No thrush.  Neck: Supple, No tracheal deviation. No obvious thyromegaly Chest: No pain to chest wall compression. Good respiratory excursion. No audible wheezing CV: Pulses intact. Regular rhythm. No major extremity edema Ext: No obvious deformity or contracture. Edema: Present at: BLE. No cyanosis Skin: No major subcutaneous nodules. Warm and dry Musculoskeletal: Severe joint rigidity not present. No obvious clubbing. No digital petechiae.   Abdomen:  Obese with panniculus Hernia: Not present. Diastasis recti: Mild supraumbilical  midline. Soft. Nondistended. Nontender. No hepatomegaly. No splenomegaly  Genital/Pelvic: Inguinal hernia: Not present. Inguinal lymph nodes: without lymphadenopathy.   Rectal: (Deferred)    ###################################################################  Labs, Imaging and Diagnostic Testing:  Located in 'Care Everywhere' section of Epic EMR chart  PRIOR [facility] CLINIC NOTES:  Not applicable  SURGERY NOTES:  Not applicable  PATHOLOGY:  Not applicable  Assessment and Plan:  DIAGNOSES:  Diagnoses and all orders for this visit:  Chronic cholecystitis with calculus  Does mobilize using walker  Chronic constipation  Heartburn  BMI 30.0-30.9,adult  Chronic anticoagulation  History of pulmonary embolism    ASSESSMENT/PLAN  Morbidly obese woman with numerous health issues who has a story rather suspicious for biliary colic with gallstones.  Standard of care is to consider cholecystectomy. Laparoscopic approach. Can they be start single site but suspect she will need multiple ports given her obesity. We will see. Usually this is an outpatient surgery but with her multiple medical problems may need to watch her overnight. The rest of the differential diagnosis seems underwhelming., But I did caution with her numerous other abdominal issues such as heartburn and severe constipation; she will still have some issues with her abdomen. She understands & is interested in proceeding.   The anatomy & physiology of  hepatobiliary & pancreatic function was discussed. The pathophysiology of gallbladder dysfunction was discussed. Natural history risks without surgery was discussed. I feel the risks of no intervention will lead to serious problems that outweigh the operative risks; therefore, I recommended cholecystectomy to remove the pathology. I explained laparoscopic techniques with possible need for an open approach. Probable cholangiogram to evaluate the bilary tract was  explained as well. Possible need for a needle core biopsy if liver changes seen or history of abnormal liver labs  Risks such as bleeding, infection, diarrhea and other bowel changes, abscess, leak, injury to other organs, need for repair of tissues / organs, need for further treatment, stroke, heart attack, death, and other risks were discussed. I noted a good likelihood this will help address the problem, but there is a chance it may not help. Possibility that this will not correct all abdominal symptoms was explained. Goals of post-operative recovery were discussed as well. We will work to minimize complications. An educational handout further explaining the pathology and treatment options was given as well. Questions were answered. The patient expresses understanding & wishes to proceed with surgery.  Given her limited mobility and need for chronic anticoagulation, I recommended getting cardiac clearance. She is followed by Dr. Ellyn Hack in their group and just saw them recently. Most likely it will be just a formal clearance and okay to come off Eliquis 2 days preop. We will see.     Adin Hector, MD, FACS, MASCRS Esophageal, Gastrointestinal & Colorectal Surgery Robotic and Minimally Invasive Surgery  [facility], [place]  [place]. [address] [place], [place] [address] [phone] Fax [phone] Main  CONTACT INFORMATION:  Weekday (9AM-5PM): Call [facility] main office at [phone]  Weeknight (5PM-9AM) or Weekend/Holiday: Check [email or web address] (password " [email or web address]") for General Surgery [facility] coverage  (Please, do not use SecureChat as it is not reliable communication to operating surgeons for immediate patient care)

## 2022-01-10 NOTE — Plan of Care (Signed)
Pt unable to ambulate to bed from stretcher. Vitals stable and pt complains of mild pain.

## 2022-01-10 NOTE — Anesthesia Postprocedure Evaluation (Signed)
Anesthesia Post Note  Patient: Tricia Harrison  Procedure(s) Performed: LAPAROSCOPIC CHOLECYSTECTOMY WITH INTRAOPERATIVE CHOLANGIOGRAM     Anesthesia Post Evaluation No notable events documented.  Last Vitals:  Vitals:   01/10/22 0945 01/10/22 1015  BP: (!) 156/72 (!) 168/76  Pulse: 63 (!) 58  Resp: 14 12  Temp: 36.7 C 36.6 C  SpO2: 96% 98%    Last Pain:  Vitals:   01/10/22 1015  TempSrc:   PainSc: Asleep                 Zyeir Dymek

## 2022-01-10 NOTE — Progress Notes (Signed)
Patient rang call light and asked to go to the bathroom. Patient was given a walker to assist her into the bathroom.. NT was walking with patient. Patient's knees "buckled" and patient was assisted to the ground onto her knees first then to her bottom. Patient sustained no obvious injuries and complains of no pain at this time. Will continue to monitor.

## 2022-01-10 NOTE — Anesthesia Procedure Notes (Signed)
Procedure Name: Intubation Date/Time: 01/10/2022 7:43 AM Performed by: West Pugh, CRNA Pre-anesthesia Checklist: Patient identified, Emergency Drugs available, Suction available, Patient being monitored and Timeout performed Patient Re-evaluated:Patient Re-evaluated prior to induction Oxygen Delivery Method: Circle system utilized Preoxygenation: Pre-oxygenation with 100% oxygen Induction Type: IV induction Ventilation: Mask ventilation without difficulty Laryngoscope Size: Mac and 3 Grade View: Grade I Tube type: Oral Tube size: 7.0 mm Number of attempts: 1 Airway Equipment and Method: Stylet Placement Confirmation: ETT inserted through vocal cords under direct vision, positive ETCO2, CO2 detector and breath sounds checked- equal and bilateral Secured at: 21 cm Tube secured with: Tape Dental Injury: Teeth and Oropharynx as per pre-operative assessment

## 2022-01-10 NOTE — Discharge Instructions (Addendum)
Follow-up with your primary care physician to make sure your adjustments to your blood pressure medications are safe.  Follow-up with your surgeon, Dr. Johney Maine to make sure there are no issues from your gallbladder removal.  ################################################################  LAPAROSCOPIC SURGERY: POST OP INSTRUCTIONS  ######################################################################  EAT Gradually transition to a high fiber diet with a fiber supplement over the next few weeks after discharge.  Start with a pureed / full liquid diet (see below)  WALK Walk an hour a day.  Control your pain to do that.    CONTROL PAIN Control pain so that you can walk, sleep, tolerate sneezing/coughing, go up/down stairs.  HAVE A BOWEL MOVEMENT DAILY Keep your bowels regular to avoid problems.  OK to try a laxative to override constipation.  OK to use an antidairrheal to slow down diarrhea.  Call if not better after 2 tries  CALL IF YOU HAVE PROBLEMS/CONCERNS Call if you are still struggling despite following these instructions. Call if you have concerns not answered by these instructions  ######################################################################    DIET: Follow a light bland diet & liquids the first 24 hours after arrival home, such as soup, liquids, starches, etc.  Be sure to drink plenty of fluids.  Quickly advance to a usual solid diet within a few days.  Avoid fast food or heavy meals as your are more likely to get nauseated or have irregular bowels.  A low-fat, high-fiber diet for the rest of your life is ideal.  Take your usually prescribed home medications unless otherwise directed.  PAIN CONTROL: Pain is best controlled by a usual combination of three different methods TOGETHER: Ice/Heat Over the counter pain medication Prescription pain medication Most patients will experience some swelling and bruising around the incisions.  Ice packs or heating pads  (30-60 minutes up to 6 times a day) will help. Use ice for the first few days to help decrease swelling and bruising, then switch to heat to help relax tight/sore spots and speed recovery.  Some people prefer to use ice alone, heat alone, alternating between ice & heat.  Experiment to what works for you.  Swelling and bruising can take several weeks to resolve.   It is helpful to take an over-the-counter pain medication regularly for the first few weeks.  Choose one of the following that works best for you: Naproxen (Aleve, etc)  Two 220mg  tabs twice a day Ibuprofen (Advil, etc) Three 200mg  tabs four times a day (every meal & bedtime) Acetaminophen (Tylenol, etc) 500-650mg  four times a day (every meal & bedtime) A  prescription for pain medication (such as oxycodone, hydrocodone, tramadol, gabapentin, methocarbamol, etc) should be given to you upon discharge.  Take your pain medication as prescribed.  If you are having problems/concerns with the prescription medicine (does not control pain, nausea, vomiting, rash, itching, etc), please call us [phone] to see if we need to switch you to a different pain medicine that will work better for you and/or control your side effect better. If you need a refill on your pain medication, please give [place] 48 hour notice.  contact your pharmacy.  They will contact our office to request authorization. Prescriptions will not be filled after 5 pm or on week-ends  Avoid getting constipated.   Between the surgery and the pain medications, it is common to experience some constipation.   Increasing fluid intake and taking a fiber supplement (such as Metamucil, Citrucel, FiberCon, MiraLax, etc) 1-2 times a day regularly will usually help prevent this  problem from occurring.   A mild laxative (prune juice, Milk of Magnesia, MiraLax, etc) should be taken according to package directions if there are no bowel movements after 48 hours.   Watch out for diarrhea.   If you have  many loose bowel movements, simplify your diet to bland foods & liquids for a few days.   Stop any stool softeners and decrease your fiber supplement.   Switching to mild anti-diarrheal medications (Kayopectate, Pepto Bismol) can help.   If this worsens or does not improve, please call us.  Wash / shower every day.  You may shower over the dressings as they are waterproof.  Continue to shower over incision(s) after the dressing is off.  REMOVE ALL DRESSINGS: Remove your waterproof bandages (tegaderm clear band-aids, steristrip skin tapes, etc) THREE DAYS AFTER SURGERY.  You may leave the incisions open to air.  You may replace a dressing/Band-Aid to cover the incision for comfort if you wish.   ACTIVITIES as tolerated:   You may resume regular (light) daily activities beginning the next day--such as daily self-care, walking, climbing stairs--gradually increasing activities as tolerated.  If you can walk 30 minutes without difficulty, it is safe to try more intense activity such as jogging, treadmill, bicycling, low-impact aerobics, swimming, etc. Save the most intensive and strenuous activity for last such as sit-ups, heavy lifting, contact sports, etc  Refrain from any heavy lifting or straining until you are off narcotics for pain control.   DO NOT PUSH THROUGH PAIN.  Let pain be your guide: If it hurts to do something, don't do it.  Pain is your body warning you to avoid that activity for another week until the pain goes down. You may drive when you are no longer taking prescription pain medication, you can comfortably wear a seatbelt, and you can safely maneuver your car and apply brakes. You may have sexual intercourse when it is comfortable.  FOLLOW UP in our office Please call [facility] at [phone] to set up an appointment to see your surgeon in the office for a follow-up appointment approximately 2-3 weeks after your surgery. Make sure that you call for this appointment the day you arrive  home to insure a convenient appointment time.  10. IF YOU HAVE DISABILITY OR FAMILY LEAVE FORMS, BRING THEM TO THE OFFICE FOR PROCESSING.  DO NOT GIVE THEM TO YOUR DOCTOR.   WHEN TO CALL us [phone]: Poor pain control Reactions / problems with new medications (rash/itching, nausea, etc)  Fever over 101.5 F (38.5 C) Inability to urinate Nausea and/or vomiting Worsening swelling or bruising Continued bleeding from incision. Increased pain, redness, or drainage from the incision   The clinic staff is available to answer your questions during regular business hours (8:30am-5pm).  Please dont hesitate to call and ask to speak to one of our nurses for clinical concerns.   If you have a medical emergency, go to the nearest emergency room or call 911.  A surgeon from [facility] is always on call at the [facility], [place], [place], [place], [place]  [address] ? MAIN: [phone] ? TOLL FREE: [phone] ?  FAX [phone] [email or web address]  ##############################################################

## 2022-01-10 NOTE — Transfer of Care (Signed)
Immediate Anesthesia Transfer of Care Note  Patient: Tricia Harrison  Procedure(s) Performed: LAPAROSCOPIC CHOLECYSTECTOMY WITH INTRAOPERATIVE CHOLANGIOGRAM  Patient Location: PACU  Anesthesia Type:General  Level of Consciousness: awake, drowsy and patient cooperative  Airway & Oxygen Therapy: Patient Spontanous Breathing and Patient connected to face mask oxygen  Post-op Assessment: Report given to RN and Post -op Vital signs reviewed and stable  Post vital signs: Reviewed and stable  Last Vitals:  Vitals Value Taken Time  BP 153/67 01/10/22 0928  Temp    Pulse 64 01/10/22 0930  Resp 14 01/10/22 0930  SpO2 95 % 01/10/22 0930  Vitals shown include unvalidated device data.  Last Pain:  Vitals:   01/10/22 0520  TempSrc: Oral         Complications: No notable events documented.

## 2022-01-10 NOTE — Op Note (Signed)
01/10/2022  PATIENT:  Tricia Harrison  76 y.o. 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  ASSISTANT: OR Staff   ANESTHESIA:    General with endotracheal intubation Local anesthetic as a field block  EBL:  (See Anesthesia Intraoperative Record) Total I/O In: 1200 [I.V.:1100; IV Piggyback:100] Out: -   Delay start of Pharmacological VTE agent (>24hrs) due to surgical blood loss or risk of bleeding:  no  DRAINS: None   SPECIMEN: Gallbladder  & Liver wedge  DISPOSITION OF SPECIMEN:  PATHOLOGY  COUNTS:  YES  PLAN OF CARE: Admit for overnight observation  PATIENT DISPOSITION:  PACU - hemodynamically stable.  INDICATION: Pleasant woman struggle with intermittent abdominal pains with gallstones.  Suspicious for chronic cholecystitis.  Rest of the differential diagnosis seemed underwhelming.  I offered cholecystectomy.  She is somewhat medically fragile but was cleared for surgery.  The anatomy & physiology of hepatobiliary & pancreatic function was discussed.  The pathophysiology of gallbladder dysfunction was discussed.  Natural history risks without surgery was discussed.   I feel the risks of no intervention will lead to serious problems that outweigh the operative  risks; therefore, I recommended cholecystectomy to remove the pathology.  I explained laparoscopic techniques with possible need for an open approach.  Probable cholangiogram to evaluate the bilary tract was explained as well.    Risks such as bleeding, infection, abscess, leak, injury to other organs, need for further treatment, heart attack, death, and other risks were discussed.  I noted a good likelihood this will help address the problem.  Possibility that this will not correct all abdominal symptoms was explained.  Goals of post-operative recovery were discussed as well.  We will work to minimize complications.  An educational handout further explaining the pathology and treatment options was given as well.  Questions were answered.  The patient expresses understanding & wishes to proceed with surgery.  OR FINDINGS: Moderate adhesions to the gallbladder with some wall thickening consistent with chronic cholecystitis.  Liver with some mild fatty change.  Rather hepatic gallbladder with tongue of liver on and so therefore en bloc wedge liver biopsy done.  Cholangiogram showing rather classic biliary anatomy with no choledocholithiasis leak or stricture.  Moderate periumbilical and infraumbilical omental adhesions limiting capacity to do single site cholecystectomy.  Therefore standard four-port done.  Inspection confirmed adhesions to the anterior abdominal wall were all omental.  Liver: Fatty steatohepatitis  DESCRIPTION:   The patient was identified & brought in the operating room. The patient was positioned supine with arms tucked. SCDs were active during the entire case. The patient underwent general anesthesia without any difficulty.  The abdomen was prepped and draped in a sterile fashion. A Surgical Timeout confirmed our plan.  I made a transverse curvilinear incision through the superior umbilical fold.  I placed a 4mm long port through the supraumbilical fascia using a modified Hassan  cutdown technique with umbilical  stalk fascial countertraction. I began carbon dioxide insufflation.  No change in end tidal CO2 measurement.   Camera inspection revealed no injury.  However there were moderate omental adhesions.  I worked to try and swing around and eventually could see the right upper quadrant but there was significant omental adhesions limiting the view.  I could see the transverse colon coming up with the omental adhesions towards the port.  Therefore I felt it was not safe to continue with single site only.  I placed a 5 mm ports in the right flank x2 under laparoscopic guidance.  I looked through those port to look at the periumbilical port and noticed moderate section of omental adhesions.  I freed those off using focused harmonic dissection until all adhesions were freed off the entire abdominal wall.  I did meticulous inspection to confirm that the adhesions were all omental.  Neither transverse colon nor small bowel were involved.    I turned attention to the right upper quadrant.  The gallbladder fundus was elevated cephalad.  It was covered with omental adhesions and the duodenal bulb on the ventral surface.  I freed adhesions to the ventral surface of the gallbladder off carefully.  I freed the peritoneal coverings between the gallbladder and the liver on the posteriolateral and anteriomedial walls. I alternated between Harmonic & blunt [place] dissection to help get a good critical view of the cystic artery and cystic duct.  did further dissection to free 80%of the gallbladder off the liver bed to get a good critical view of the infundibulum and cystic duct. I dissected out the cystic artery; and, after getting a good 360 view, ligated the anterior & posterior branches of the cystic artery close on the infundibulum using the Harmonic ultrasonic dissection.  I skeletonized the cystic duct.  I placed a clip on the infundibulum. I did a partial cystic duct-otomy and ensured patency.  I placed a 5 [place] cholangiocatheter through a puncture site at the right subcostal ridge of the abdominal wall and directed it into the cystic duct.  We ran a cholangiogram with dilute radio-opaque contrast and continuous fluoroscopy. Contrast flowed from a side branch consistent with cystic duct cannulization. Contrast flowed up the common hepatic duct into the right and left intrahepatic chains out to secondary radicals. Contrast flowed down the common bile duct easily across the normal ampulla into the duodenum.  This was consistent with a normal cholangiogram.  I removed the cholangiocatheter.  I freed the last few adhesions of the gallbladder to the liver bed.  It was rather intrahepatic sign of taking a wedge of liver that was a tongue over the dome in an en bloc fashion to have a more clean resection using the Harmonic scalpel.  Cystic duct was rather dilated so I ligated it with a 0 PDS Endoloop lasting around the entire gallbladder until I came down to the cystic duct/common bile duct junction to get a good circumferential ligation of the dilated cystic duct.  I placed clips on the cystic duct x4.   I completed cystic duct transection. I freed the gallbladder from its remaining attachments to the liver. I ensured hemostasis on the gallbladder fossa of the liver and elsewhere. I inspected the rest of the abdomen & detected no injury nor bleeding elsewhere.  I placed the gallbladder inside and EcoSac.  I did copious irrigation and focal cauterization on the gallbladder fossa of the liver to ensure good hemostasis.  Removed the gallbladder out the supraumbilical fascia.  I closed the fascia transversely using  #1 PDS interrupted stitches. I closed the skin using 4-0 monocryl stitch.  Sterile dressing was applied. The patient was extubated & arrived in the PACU in stable condition..  I had discussed postoperative care with the patient in the holding area. I discussed operative findings, updated the  patient's status, discussed probable steps to recovery, and gave postoperative recommendations to the  patient's son Tricia Harrison.  Recommendations were made.  Questions were answered.  He expressed understanding & appreciation.  Adin Hector, M.D., F.A.C.S. Gastrointestinal and Minimally Invasive Surgery [facility] [address] [place], [place] [address] [phone] Main / Paging  01/10/2022 9:36 AM

## 2022-01-11 ENCOUNTER — Inpatient Hospital Stay (HOSPITAL_COMMUNITY): Payer: Medicare HMO

## 2022-01-11 ENCOUNTER — Encounter (HOSPITAL_COMMUNITY): Payer: Self-pay | Admitting: Surgery

## 2022-01-11 DIAGNOSIS — Z9989 Dependence on other enabling machines and devices: Secondary | ICD-10-CM

## 2022-01-11 DIAGNOSIS — K8010 Calculus of gallbladder with chronic cholecystitis without obstruction: Secondary | ICD-10-CM | POA: Diagnosis present

## 2022-01-11 DIAGNOSIS — E11319 Type 2 diabetes mellitus with unspecified diabetic retinopathy without macular edema: Secondary | ICD-10-CM | POA: Diagnosis present

## 2022-01-11 DIAGNOSIS — K5730 Diverticulosis of large intestine without perforation or abscess without bleeding: Secondary | ICD-10-CM | POA: Insufficient documentation

## 2022-01-11 DIAGNOSIS — Z20822 Contact with and (suspected) exposure to covid-19: Secondary | ICD-10-CM | POA: Diagnosis present

## 2022-01-11 DIAGNOSIS — I951 Orthostatic hypotension: Secondary | ICD-10-CM

## 2022-01-11 DIAGNOSIS — K811 Chronic cholecystitis: Secondary | ICD-10-CM | POA: Diagnosis not present

## 2022-01-11 DIAGNOSIS — E669 Obesity, unspecified: Secondary | ICD-10-CM | POA: Diagnosis present

## 2022-01-11 DIAGNOSIS — D72829 Elevated white blood cell count, unspecified: Secondary | ICD-10-CM | POA: Diagnosis not present

## 2022-01-11 DIAGNOSIS — E65 Localized adiposity: Secondary | ICD-10-CM | POA: Diagnosis present

## 2022-01-11 DIAGNOSIS — K7581 Nonalcoholic steatohepatitis (NASH): Secondary | ICD-10-CM | POA: Diagnosis present

## 2022-01-11 DIAGNOSIS — I129 Hypertensive chronic kidney disease with stage 1 through stage 4 chronic kidney disease, or unspecified chronic kidney disease: Secondary | ICD-10-CM | POA: Diagnosis present

## 2022-01-11 DIAGNOSIS — K219 Gastro-esophageal reflux disease without esophagitis: Secondary | ICD-10-CM | POA: Diagnosis present

## 2022-01-11 DIAGNOSIS — G90A Postural orthostatic tachycardia syndrome (POTS): Secondary | ICD-10-CM | POA: Diagnosis present

## 2022-01-11 DIAGNOSIS — G4733 Obstructive sleep apnea (adult) (pediatric): Secondary | ICD-10-CM | POA: Diagnosis present

## 2022-01-11 DIAGNOSIS — I2510 Atherosclerotic heart disease of native coronary artery without angina pectoris: Secondary | ICD-10-CM | POA: Diagnosis present

## 2022-01-11 DIAGNOSIS — M419 Scoliosis, unspecified: Secondary | ICD-10-CM | POA: Diagnosis present

## 2022-01-11 DIAGNOSIS — E039 Hypothyroidism, unspecified: Secondary | ICD-10-CM | POA: Diagnosis present

## 2022-01-11 DIAGNOSIS — K660 Peritoneal adhesions (postprocedural) (postinfection): Secondary | ICD-10-CM | POA: Diagnosis present

## 2022-01-11 DIAGNOSIS — R1011 Right upper quadrant pain: Secondary | ICD-10-CM | POA: Diagnosis present

## 2022-01-11 DIAGNOSIS — Z6833 Body mass index (BMI) 33.0-33.9, adult: Secondary | ICD-10-CM | POA: Diagnosis not present

## 2022-01-11 DIAGNOSIS — I700 Atherosclerosis of aorta: Secondary | ICD-10-CM | POA: Insufficient documentation

## 2022-01-11 DIAGNOSIS — E1122 Type 2 diabetes mellitus with diabetic chronic kidney disease: Secondary | ICD-10-CM | POA: Diagnosis present

## 2022-01-11 DIAGNOSIS — R1310 Dysphagia, unspecified: Secondary | ICD-10-CM | POA: Diagnosis present

## 2022-01-11 DIAGNOSIS — N3941 Urge incontinence: Secondary | ICD-10-CM | POA: Diagnosis present

## 2022-01-11 DIAGNOSIS — N1832 Chronic kidney disease, stage 3b: Secondary | ICD-10-CM | POA: Diagnosis present

## 2022-01-11 DIAGNOSIS — E785 Hyperlipidemia, unspecified: Secondary | ICD-10-CM | POA: Diagnosis present

## 2022-01-11 DIAGNOSIS — K5909 Other constipation: Secondary | ICD-10-CM | POA: Diagnosis present

## 2022-01-11 DIAGNOSIS — E11649 Type 2 diabetes mellitus with hypoglycemia without coma: Secondary | ICD-10-CM | POA: Diagnosis not present

## 2022-01-11 DIAGNOSIS — I071 Rheumatic tricuspid insufficiency: Secondary | ICD-10-CM | POA: Diagnosis present

## 2022-01-11 LAB — URINALYSIS, COMPLETE (UACMP) WITH MICROSCOPIC
Bilirubin Urine: NEGATIVE
Glucose, UA: NEGATIVE mg/dL
Hgb urine dipstick: NEGATIVE
Ketones, ur: NEGATIVE mg/dL
Nitrite: NEGATIVE
Protein, ur: NEGATIVE mg/dL
Specific Gravity, Urine: 1.015 (ref 1.005–1.030)
pH: 5 (ref 5.0–8.0)

## 2022-01-11 LAB — BASIC METABOLIC PANEL
Anion gap: 6 (ref 5–15)
BUN: 21 mg/dL (ref 8–23)
CO2: 23 mmol/L (ref 22–32)
Calcium: 8.4 mg/dL — ABNORMAL LOW (ref 8.9–10.3)
Chloride: 104 mmol/L (ref 98–111)
Creatinine, Ser: 1.43 mg/dL — ABNORMAL HIGH (ref 0.44–1.00)
GFR, Estimated: 38 mL/min — ABNORMAL LOW (ref >60)
Glucose, Bld: 142 mg/dL — ABNORMAL HIGH (ref 70–99)
Potassium: 3.8 mmol/L (ref 3.5–5.1)
Sodium: 133 mmol/L — ABNORMAL LOW (ref 135–145)

## 2022-01-11 LAB — CBC
HCT: 27.9 % — ABNORMAL LOW (ref 36.0–46.0)
Hemoglobin: 9.6 g/dL — ABNORMAL LOW (ref 12.0–15.0)
MCH: 34.2 pg — ABNORMAL HIGH (ref 26.0–34.0)
MCHC: 34.4 g/dL (ref 30.0–36.0)
MCV: 99.3 fL (ref 80.0–100.0)
Platelets: 285 10*3/uL (ref 150–400)
RBC: 2.81 MIL/uL — ABNORMAL LOW (ref 3.87–5.11)
RDW: 14.2 % (ref 11.5–15.5)
WBC: 15.9 10*3/uL — ABNORMAL HIGH (ref 4.0–10.5)
nRBC: 0 % (ref 0.0–0.2)

## 2022-01-11 LAB — MAGNESIUM: Magnesium: 1.7 mg/dL (ref 1.7–2.4)

## 2022-01-11 LAB — SURGICAL PATHOLOGY

## 2022-01-11 MED ORDER — APIXABAN 5 MG PO TABS
5.0000 mg | ORAL_TABLET | Freq: Two times a day (BID) | ORAL | Status: DC
  Administered 2022-01-11 – 2022-01-15 (×8): 5 mg via ORAL
  Filled 2022-01-11 (×8): qty 1

## 2022-01-11 MED ORDER — SODIUM CHLORIDE 0.9 % IV SOLN: INTRAVENOUS | Status: DC | PRN

## 2022-01-11 MED ORDER — AZATHIOPRINE 50 MG PO TABS
50.0000 mg | ORAL_TABLET | Freq: Every day | ORAL | Status: DC
Start: 2022-01-11 — End: 2022-01-15
  Administered 2022-01-11 – 2022-01-15 (×5): 50 mg via ORAL
  Filled 2022-01-11 (×5): qty 1

## 2022-01-11 MED ORDER — ENOXAPARIN SODIUM 40 MG/0.4ML IJ SOSY
40.0000 mg | PREFILLED_SYRINGE | INTRAMUSCULAR | Status: DC
  Administered 2022-01-12: 40 mg via SUBCUTANEOUS
  Filled 2022-01-11: qty 0.4

## 2022-01-11 NOTE — Evaluation (Signed)
Occupational Therapy Evaluation Patient Details Name: Tricia Harrison MRN: [id number] DOB: 02/24/1946 Today's Date: 01/11/2022   History of Present Illness Patient is a 76 year old woman s/p lap choley.   Clinical Impression   Tricia Harrison is a 76 year old woman who presents with generalized weakness, decreased activity tolerance and impaired balance resulting in a decline in functional independence. On evaluation she required max assist to transfer to side of bed, min guard for standing and short ambulation with RW and predominantly seated position for UB ADLs and mod -max assist for LB ADLs. She has decreased strength in both upper extremities and lower extremities and decreased left shoulder ROM. Patient reports multiple falls in the last 6 months and 2 falls this week including yesterday with nursing. Patient will benefit from skilled OT services while in hospital to improve deficits and learn compensatory strategies as needed in order to return to PLOF.  Recommend short term rehab at discharge.      Recommendations for follow up therapy are one component of a multi-disciplinary discharge planning process, led by the attending physician.  Recommendations may be updated based on patient status, additional functional criteria and insurance authorization.   Follow Up Recommendations  Skilled nursing-short term rehab (<3 hours/day)    Assistance Recommended at Discharge    Patient can return home with the following A little help with walking and/or transfers;A lot of help with bathing/dressing/bathroom;Assistance with cooking/housework    Functional Status Assessment  Patient has had a recent decline in their functional status and demonstrates the ability to make significant improvements in function in a reasonable and predictable amount of time.  Equipment Recommendations  None recommended by OT    Recommendations for Other Services       Precautions / Restrictions  Precautions Precautions: Fall Precaution Comments: reports 2 falls this week Restrictions Weight Bearing Restrictions: No      Mobility Bed Mobility                    Transfers                          Balance Overall balance assessment: Needs assistance Sitting-balance support: No upper extremity supported Sitting balance-Leahy Scale: Good     Standing balance support: Reliant on assistive device for balance                               ADL either performed or assessed with clinical judgement   ADL Overall ADL's : Needs assistance/impaired Eating/Feeding: Independent   Grooming: Set up;Sitting   Upper Body Bathing: Set up;Sitting   Lower Body Bathing: Moderate assistance;Sit to/from stand   Upper Body Dressing : Set up;Sitting   Lower Body Dressing: Moderate assistance;Sit to/from stand   Toilet Transfer: Min guard;BSC/3in1;Rolling walker (2 wheels)   Toileting- Clothing Manipulation and Hygiene: Maximal assistance;Sit to/from stand       Functional mobility during ADLs: Min guard General ADL Comments: Max assist to transfer to side of bed with most assistance needed for LLE. Patient min guard to stand from elevated bed height and min guard to take s teps to recliner. From recliner patinet min guard and able to take steps to the door, approx 5 feet, with RW but reports increasing arm and leg weakness and needing to sit.     Vision Patient Visual Report: No change from baseline  Perception     Praxis      Pertinent Vitals/Pain Pain Assessment Pain Assessment: No/denies pain     Hand Dominance Right   Extremity/Trunk Assessment Upper Extremity Assessment Upper Extremity Assessment: RUE deficits/detail;LUE deficits/detail RUE Deficits / Details: shoulder ROM approx 100 degrees, 4-/5 shoulder, bicep 3+/5, tricep 4-/5, wrist 5/5, grip 4/5 RUE Sensation: WNL RUE Coordination: WNL LUE Deficits / Details: shoulder ROM  approx 75 degrees, 3-/5 shoulder, bicep 4/5, tricep 3+/5, wrist 5/5, grip 4/5 LUE Sensation: WNL LUE Coordination: WNL   Lower Extremity Assessment Lower Extremity Assessment: Defer to PT evaluation   Cervical / Trunk Assessment Cervical / Trunk Assessment: Kyphotic   Communication Communication Communication: No difficulties   Cognition                                             General Comments       Exercises     Shoulder Instructions      Home Living Family/patient expects to be discharged to:: Private residence Living Arrangements: Other relatives (sister) Available Help at Discharge: Family;Available PRN/intermittently Type of Home: Other(Comment) (townhouse)       Home Layout: Two level;Able to live on main level with bedroom/bathroom     Bathroom Shower/Tub: Walk-in shower;Tub/shower unit (sits on edge of  tub)   Bathroom Toilet: Standard     Home Equipment: Shower seat;Grab bars - tub/shower;Rollator (4 wheels);Electric scooter   Additional Comments: her bed is high and has a step stool,      Prior Functioning/Environment Prior Level of Function : Independent/Modified Independent             Mobility Comments: uses rollator, has a scooter for community ADLs Comments: increased time, reports arm weakness, left hand shaking and hx of bilateral shoulder fractures        OT Problem List: Decreased strength;Decreased activity tolerance;Impaired balance (sitting and/or standing);Decreased knowledge of use of DME or AE;Obesity      OT Treatment/Interventions: Self-care/ADL training;Therapeutic exercise;DME and/or AE instruction;Therapeutic activities;Balance training;Patient/family education    OT Goals(Current goals can be found in the care plan section) Acute Rehab OT Goals Patient Stated Goal: to get stronger OT Goal Formulation: With patient Time For Goal Achievement: 01/25/22 Potential to Achieve Goals: Good  OT Frequency:  Min 2X/week    Co-evaluation              AM-PAC OT "6 Clicks" Daily Activity     Outcome Measure Help from another person eating meals?: None Help from another person taking care of personal grooming?: A Little Help from another person toileting, which includes using toliet, bedpan, or urinal?: A Lot Help from another person bathing (including washing, rinsing, drying)?: A Lot Help from another person to put on and taking off regular upper body clothing?: A Little Help from another person to put on and taking off regular lower body clothing?: A Lot 6 Click Score: 16   End of Session Equipment Utilized During Treatment: Gait belt;Rolling walker (2 wheels) Nurse Communication: Mobility status  Activity Tolerance: Patient tolerated treatment well Patient left: in chair;with chair alarm set  OT Visit Diagnosis: Muscle weakness (generalized) (M62.81)                Time: 0175-1025 OT Time Calculation (min): 42 min Charges:  OT General Charges $OT Visit: 1 Visit OT Evaluation $OT Eval Low Complexity:  1 Low OT Treatments $Therapeutic Activity: 8-22 mins  Derl Barrow, OTR/L [place]  Office [phone] Pager: [place] 01/11/2022, 10:19 AM

## 2022-01-11 NOTE — Evaluation (Signed)
Physical Therapy Evaluation Patient Details Name: Tricia Harrison MRN: [id number] DOB: 1946-01-30 Today's Date: 01/11/2022  History of Present Illness  Patient is a 76 year old woman s/p lap chole on 01/10/22.  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 At baseline, pt ambulates in house with rollator and lives with sister. She does report increasing weakness and falls over the past 5 months.  Today, pt requiring min A of 2 for safety with close chair follow.  Nursing reports pt had orthostatic hypotension, pt now with TED hose and BP stable during therapy.  She required cues for transfers and min-mod A.  Recommend SNF at d/c. Pt currently with functional limitations due to the deficits listed below (see PT Problem List). Pt will benefit from skilled PT to increase their independence and safety with mobility to allow discharge to the venue listed below.          Recommendations for follow up therapy are one component of a multi-disciplinary discharge planning process, led by the attending physician.  Recommendations may be updated based on patient status, additional functional criteria and insurance authorization.  Follow Up Recommendations Skilled nursing-short term rehab (<3 hours/day)    Assistance Recommended at Discharge Frequent or constant Supervision/Assistance  Patient can return home with the following  A little help with walking and/or transfers;A little help with bathing/dressing/bathroom;Assistance with cooking/housework;Help with stairs or ramp for entrance    Equipment Recommendations None recommended by PT  Recommendations for Other Services       Functional Status Assessment Patient has had a recent decline in their functional status and demonstrates the ability to make significant improvements in function in a reasonable and predictable amount of  time.     Precautions / Restrictions Precautions Precautions: Fall Precaution Comments: reports 2 falls this week Restrictions Weight Bearing Restrictions: No      Mobility  Bed Mobility               General bed mobility comments: in chair at arrival    Transfers Overall transfer level: Needs assistance Equipment used: Rolling walker (2 wheels) Transfers: Sit to/from Stand Sit to Stand: Min assist           General transfer comment: Min A with mod cues for hand placement; performed x 3 during session    Ambulation/Gait Ambulation/Gait assistance: Min assist, +2 safety/equipment Gait Distance (Feet): 30 Feet (30'x3) Assistive device: Rolling walker (2 wheels) Gait Pattern/deviations: Step-to pattern, Decreased stride length, Trunk flexed Gait velocity: decreased     General Gait Details: Close chair follow (hx of falls, RN reports pt was orthostatic earlier, now with TED hose); 30'x2 with seated rest break; cues for safety, rest breaks with fatigue, and RW use  Stairs            Wheelchair Mobility    Modified Rankin (Stroke Patients Only)       Balance Overall balance assessment: Needs assistance Sitting-balance support: No upper extremity supported Sitting balance-Leahy Scale: Good     Standing balance support: Reliant on assistive device for balance Standing balance-Leahy Scale: Poor                               Pertinent Vitals/Pain Pain Assessment Pain Assessment: No/denies pain    Home Living Family/patient expects to be discharged to:: Private  residence Living Arrangements: Other relatives (sister) Available Help at Discharge: Family;Available PRN/intermittently Type of Home: House Home Access: Stairs to enter   Entrance Stairs-Number of Steps: 1   Home Layout: Two level;Able to live on main level with bedroom/bathroom Home Equipment: Shower seat;Grab bars - tub/shower;Rollator (4 wheels);Electric  scooter Additional Comments: her bed is high and has a step stool,    Prior Function Prior Level of Function : Independent/Modified Independent             Mobility Comments: uses rollator, has a scooter for community; reports increasing weakness over past 5 months ADLs Comments: increased time, reports arm weakness, left hand shaking and hx of bilateral shoulder fractures     Hand Dominance   Dominant Hand: Right    Extremity/Trunk Assessment   Upper Extremity Assessment Upper Extremity Assessment: Defer to OT evaluation RUE Deficits / Details: shoulder ROM approx 100 degrees, 4-/5 shoulder, bicep 3+/5, tricep 4-/5, wrist 5/5, grip 4/5 RUE Sensation: WNL RUE Coordination: WNL LUE Deficits / Details: shoulder ROM approx 75 degrees, 3-/5 shoulder, bicep 4/5, tricep 3+/5, wrist 5/5, grip 4/5 LUE Sensation: WNL LUE Coordination: WNL    Lower Extremity Assessment Lower Extremity Assessment: LLE deficits/detail;RLE deficits/detail RLE Deficits / Details: ROM WFL; MMT 4/5 LLE Deficits / Details: Reports this is her weaker leg; ROM WFL; MMT 4/5    Cervical / Trunk Assessment Cervical / Trunk Assessment: Kyphotic  Communication   Communication: No difficulties  Cognition Arousal/Alertness: Awake/alert Behavior During Therapy: WFL for tasks assessed/performed Overall Cognitive Status: Within Functional Limits for tasks assessed                                          General Comments General comments (skin integrity, edema, etc.): BP stable during PT - pt did have on TED hose    Exercises     Assessment/Plan    PT Assessment Patient needs continued PT services  PT Problem List Decreased strength;Decreased mobility;Decreased safety awareness;Decreased range of motion;Decreased knowledge of precautions;Decreased activity tolerance;Cardiopulmonary status limiting activity;Decreased balance;Decreased knowledge of use of DME       PT Treatment  Interventions DME instruction;Therapeutic activities;Modalities;Gait training;Therapeutic exercise;Patient/family education;Balance training;Functional mobility training    PT Goals (Current goals can be found in the Care Plan section)  Acute Rehab PT Goals Patient Stated Goal: short term rehab PT Goal Formulation: With patient Time For Goal Achievement: 01/25/22 Potential to Achieve Goals: Good    Frequency Min 2X/week     Co-evaluation               AM-PAC PT "6 Clicks" Mobility  Outcome Measure Help needed turning from your back to your side while in a flat bed without using bedrails?: A Little Help needed moving from lying on your back to sitting on the side of a flat bed without using bedrails?: A Little Help needed moving to and from a bed to a chair (including a wheelchair)?: A Lot Help needed standing up from a chair using your arms (e.g., wheelchair or bedside chair)?: A Lot Help needed to walk in hospital room?: A Lot Help needed climbing 3-5 steps with a railing? : Total 6 Click Score: 13    End of Session Equipment Utilized During Treatment: Gait belt Activity Tolerance: Patient tolerated treatment well Patient left: with chair alarm set;in chair;with call bell/phone within reach;with family/visitor present Nurse Communication: Mobility status  PT Visit Diagnosis: Other abnormalities of gait and mobility (R26.89);Muscle weakness (generalized) (M62.81);History of falling (Z91.81)    Time: 9536-9223 PT Time Calculation (min) (ACUTE ONLY): 28 min   Charges:   PT Evaluation $PT Eval Moderate Complexity: 1 Mod PT Treatments $Gait Training: 8-22 mins        Abran Richard, PT Acute Rehab Services Pager [phone] [facility] 01/11/2022, 1:21 PM

## 2022-01-11 NOTE — TOC Initial Note (Signed)
Transition of Care [facility]) - Initial/Assessment Note   Patient Details  Name: Tricia Harrison MRN: [id number] Date of Birth: Jan 04, 1946  Transition of Care [facility]) CM/SW Contact:    Sherie Don, LCSW Phone Number: 01/11/2022, 2:14 PM  Clinical Narrative: PT evaluation recommended SNF and patient is agreeable. Patient aware there are limited SNFs in-network with [facility] and it will take several days to get insurance authorization for rehab.  FL2 done; PASRR received. Initial referral faxed out. TOC awaiting bed offers.  Expected Discharge Plan: Skilled Nursing Facility Barriers to Discharge: Continued Medical Work up, Ship broker, SNF Pending bed offer  Patient Goals and CMS Choice Patient states their goals for this hospitalization and ongoing recovery are:: Go to rehab CMS Medicare.gov Compare Post Acute Care list provided to:: Patient Choice offered to / list presented to : Patient  Expected Discharge Plan and Services Expected Discharge Plan: [place] In-house Referral: Clinical Social Work Post Acute Care Choice: [place] Living arrangements for the past 2 months: [place] Expected Discharge Date: 01/11/22               DME Arranged: N/A DME Agency: NA  Prior Living Arrangements/Services Living arrangements for the past 2 months: Single Family Home Patient language and need for interpreter reviewed:: Yes Do you feel safe going back to the place where you live?: Yes      Need for Family Participation in Patient Care: No (Comment) Care giver support system in place?: Yes (comment) Criminal Activity/Legal Involvement Pertinent to Current Situation/Hospitalization: No - Comment as needed  Activities of Daily Living Home Assistive Devices/Equipment: Gilford Rile (specify type) (front wheel walker rollator and scooter) ADL Screening (condition at time of admission) Patient's cognitive ability adequate to safely complete daily  activities?: Yes Is the patient deaf or have difficulty hearing?: No Does the patient have difficulty seeing, even when wearing glasses/contacts?: No Does the patient have difficulty concentrating, remembering, or making decisions?: No Patient able to express need for assistance with ADLs?: Yes Does the patient have difficulty dressing or bathing?: No Independently performs ADLs?: Yes (appropriate for developmental age) Does the patient have difficulty walking or climbing stairs?: Yes Weakness of Legs: Both Weakness of Arms/Hands: Both  Permission Sought/Granted Permission sought to share information with : Facility Art therapist granted to share information with : Yes, Verbal Permission Granted Permission granted to share info w AGENCY: SNFs  Emotional Assessment Attitude/Demeanor/Rapport: Engaged Affect (typically observed): Accepting Orientation: : Oriented to Self, Oriented to Place, Oriented to  Time, Oriented to Situation Alcohol / Substance Use: Not Applicable Psych Involvement: No (comment)  Admission diagnosis:  Chronic cholecystitis [K81.1] Patient Active Problem List   Diagnosis Date Noted   Uses roller walker 01/11/2022   Atherosclerosis of aorta ([place]) 01/11/2022   Chronic kidney disease, stage 3b ([place]) 01/11/2022   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   Orthostatic hypotension 01/11/2022   Leukocytosis 01/11/2022   Chronic cholecystitis 01/10/2022   Obstructive sleep apnea 03/09/2019   Aortic regurgitation 12/02/2018   History of pulmonary embolism 08/20/2018   DOE (dyspnea on exertion)    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   Bilateral ovarian cysts    Hx of gallstones    Hernia, hiatal    Renal cysts, acquired, bilateral    GERD (gastroesophageal reflux disease)    Diverticulosis    Lumbar spinal stenosis    Diabetes mellitus without complication (HCC)    Left knee DJD    Rapid  palpitations 12/06/2013   Morbid  obesity ([place]) 12/06/2013   Fatigue 12/06/2013   PVC (premature ventricular contraction) 12/06/2013   Essential hypertension    Hypothyroidism    Hyperlipidemia LDL goal <100    PCP:  Lujean Amel, MD Pharmacy:   [id number] - [place], [place] - [place] [address] [place] [address] Phone: [phone] Fax: [phone]  [facility] ([place], [place] [facility] [address] [place] [address] [place] [address] Phone: [phone] Fax: [phone]  [facility] ([facility] ) - [place], [place] [address] Phone: [phone] Fax: [phone]  Readmission Risk Interventions No flowsheet data found.

## 2022-01-11 NOTE — Evaluation (Signed)
Clinical/Bedside Swallow Evaluation Patient Details  Name: Tricia Harrison MRN: [id number] Date of Birth: [birth year]  Today's Date: 01/11/2022 Time: SLP Start Time (ACUTE ONLY): 8768 SLP Stop Time (ACUTE ONLY): 1157 SLP Time Calculation (min) (ACUTE ONLY): 24 min  Past Medical History:  Past Medical History:  Diagnosis Date   Anemia    as a child   Asthma    as a child   Chronic back pain    spinal stenosis and buldging disc;scoliosis   Chronic constipation    occasionally takes something OTC   Chronic kidney disease    Diabetes mellitus without complication ([place])    per pt "Pre" for 20+yrs   Diverticulosis    DVT (deep venous thrombosis) (HCC)    Gallstones    has known about this for 3-37yrs   GERD (gastroesophageal reflux disease)    takes Omeprazole daily   H/O hiatal hernia    Heart murmur    Hemorrhoids    History of blood transfusion    no abnormal reaction noted   History of bronchitis    states its been a long time ago   History of gout    HLD (hyperlipidemia)    takes Fenofibrate daily   HTN (hypertension)    takes Diltiazem and Ramipril daily   Hypothyroidism    takes Synthroid daily   Left knee DJD    Nocturia    Palpitations    started in 2013 and only occasionally;last time noticed about 2-3wks ago and after drinking caffeine   PONV (postoperative nausea and vomiting)    Pulmonary embolism with acute cor pulmonale ([place]) 07/2018   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   Past Surgical History:  Past Surgical History:  Procedure Laterality Date   3-day EVENT MONITOR  01/2019   Mostly normal monitor.  Predominantly sinus rhythm (rate range 48-105 bpm, average 67 bpm) 9 short atrial runs (fastest = 13 beats rate 135 bpm, longest ~17 seconds-101 bpm) -> not noted on symptom log.  No sustained arrhythmias (tachycardia or bradycardia), or pauses..  Symptoms are noted with PVCs.    CHOLECYSTECTOMY N/A 01/10/2022   Procedure: LAPAROSCOPIC CHOLECYSTECTOMY WITH INTRAOPERATIVE CHOLANGIOGRAM;  Surgeon: Michael Boston, MD;  Location: [facility];  Service: General;  Laterality: N/A;   COLONOSCOPY     CT ANGIOGRAM OF CHEST - PE PROTOCOL  07/2018   Bilateral nonocclusive pulmonary emboli evidence of right heart strain consistent with "submassive "/intermediate PE.  -->  Code PE called.   DILATION AND CURETTAGE OF UTERUS     multiple about 6-7 times   ESOPHAGOGASTRODUODENOSCOPY     NM VQ LUNG SCAN ([facility] HX)  11/23/2018   Low probability for PE   RIGHT/LEFT HEART CATH AND CORONARY ANGIOGRAPHY N/A 08/19/2018   Procedure: RIGHT/LEFT HEART CATH AND CORONARY ANGIOGRAPHY;  Surgeon: Jettie Booze, MD;  Location: [place];  Service: Cardiovascular:: Nonobstructive CAD.  Mild pulmonary pretension.  Mean PA pressure 26 mmHg with PA P 48/20 mmHg.  Normal LVEDP.   THYROID SURGERY Right 2010   TOTAL HIP ARTHROPLASTY Left 2005   TOTAL KNEE ARTHROPLASTY Left 02/21/2014   Procedure: TOTAL KNEE ARTHROPLASTY;  Surgeon: Lorn Junes, MD;  Location: [place];  Service: Orthopedics;  Laterality: Left;   TRANSTHORACIC ECHOCARDIOGRAM  07/2018    (In setting of acute PE) mild LVH.  EF 55 to 60%.  GR 1 DD.  Aortic  sclerosis but no stenosis.  Mild regurgitation.  Mild RA dilation.  Moderate LA dilation.  Moderate tricuspid regurgitation with severe primary hypertension estimated PA pressure 71 mmHg.  RV poorly visualized   TRANSTHORACIC ECHOCARDIOGRAM  10/2018 - 11/2019   a) EF 60-65%.  GR 1 DD.  Focal basal LVH. Mod AI/ no AS. Mild LA dilation.  Normal RV size and fxn.  Mod PI w/ mild TR.  Peak PAP ~ 38 mmHg; b) Normal EF 60 to 65%.  Moderate LVH-GR 1 DD.  R WMA.  Mild AS w/ trivial AI.  Grossly normal PA, RV and RA size.  Normal RV function.  Normal PA pressures.   TRANSTHORACIC ECHOCARDIOGRAM  01/2021    EF 55%.  Normal LV function.  Mild LVH.  GR 1 DD.  Normal PA pressures.  Severe LA dilation.   (Consistent with more significant diastolic dysfunction).  Mild-possibly mild to moderate aortic insufficiency.  Borderline elevated RAP.  Trivial pericardial effusion.  (Stable)   VAGINAL HYSTERECTOMY  1979   HPI:  Patient is a 76 year old woman s/p lap chole on 01/10/22. Swallow eval was ordered due to pt report of dysphagia to solids post-op. 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 She has hypernasality of speech and c/o nasal regurgitation, but nasal emission is not observed. She has multiple, wet-sounding swallows per bolus especially wtih thin liquids and purees. She subjectively tells me that she is getting choked frequently, but I do not see overt s/s of aspiration during these trials.   Surgical team made aware of possible neuro/cranial nerve finding, although note that it is also difficult to discern a clear date of onset. She and her sister share that there have been subtle changes to her speech over the last 1-2 months, but that there was an abrupt change today. RN reports that speech is unchanged from previous date. At different times throughout this evaluation, pt told me that changes to her swallowing began this afternoon, this morning, and previous date, but she also noted that she has had trouble managing saliva at home.  Given the unclear history, it is challenging to truly know when these symptoms may have begun or been exacerbated. However, will leave her on current diet (soft/thin liquids) given no overt s/s of aspiration at this time. Encouraged her to pause PO intake if overt coughing begins. Additional swallowing precautions were reviewed with her, but suspect that reinforcement will be needed. Will continue to follow acutely  given signs of dysphagia with consideration for MBS early next week if symptoms persist through the weekend.  SLP Visit Diagnosis: Dysphagia, unspecified (R13.10)    Aspiration Risk  Mild aspiration risk;Moderate aspiration risk    Diet Recommendation Dysphagia 3 (Mech soft);Thin liquid   Liquid Administration via: Cup Medication Administration: Crushed with puree Supervision: Patient able to self feed;Intermittent supervision to cue for compensatory strategies Compensations: Minimize environmental distractions;Slow rate;Small sips/bites;Follow solids with liquid Postural Changes: Seated upright at 90 degrees;Remain upright for at least 30 minutes after po intake    Other  Recommendations Recommended Consults: Other (Comment) (neuro) Oral Care Recommendations: Oral care BID    Recommendations for follow up therapy are one component of a multi-disciplinary discharge planning process, led by the attending  physician.  Recommendations may be updated based on patient status, additional functional criteria and insurance authorization.  Follow up Recommendations Skilled nursing-short term rehab (<3 hours/day)      Assistance Recommended at Discharge Intermittent Supervision/Assistance  Functional Status Assessment Patient has had a recent decline in their functional status and demonstrates the ability to make significant improvements in function in a reasonable and predictable amount of time.  Frequency and Duration min 2x/week  2 weeks       Prognosis Prognosis for Safe Diet Advancement: Fair Barriers to Reach Goals: Cognitive deficits;Other (Comment) (unclear etiology/onset)      Swallow Study   General HPI: Patient is a 76 year old woman s/p lap chole on 01/10/22. Swallow eval was ordered due to pt report of dysphagia to solids post-op. 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 Type of Study: Bedside Swallow  Evaluation Previous Swallow Assessment: none in chart Diet Prior to this Study: Dysphagia 3 (soft);Thin liquids Temperature Spikes Noted: No Respiratory Status: Room air History of Recent Intubation: Yes Length of Intubations (days):  (for procedure only) Date extubated: 01/10/22 Behavior/Cognition: Alert;Cooperative Oral Cavity Assessment: Within Functional Limits Oral Care Completed by SLP: No Oral Cavity - Dentition: Adequate natural dentition Vision: Functional for self-feeding Self-Feeding Abilities: Able to feed self Patient Positioning: Upright in chair Baseline Vocal Quality: Other (comment) (impaired resonance) Volitional Cough: Strong Volitional Swallow: Able to elicit    Oral/Motor/Sensory Function Overall Oral Motor/Sensory Function:  (difficult to visualize velum but with impaired resonance and c/o nasal regurgitation)   Ice Chips Ice chips: Not tested   Thin Liquid Thin Liquid: Impaired Presentation: Cup;Self Fed Oral Phase Functional Implications: Other (comment) (c/o nasal regurgitation) Pharyngeal  Phase Impairments: Multiple swallows    Nectar Thick Nectar Thick Liquid: Not tested   Honey Thick Honey Thick Liquid: Not tested   Puree Puree: Impaired Presentation: Self Fed;Spoon Oral Phase Functional Implications: Other (comment) (c/o nasal regurgitation) Pharyngeal Phase Impairments: Multiple swallows   Solid     Solid: Impaired Presentation: Self Fed Pharyngeal Phase Impairments: Multiple swallows      Osie Bond., M.A. [place] Pager [phone] Office [phone]  01/11/2022,4:00 PM

## 2022-01-11 NOTE — NC FL2 (Signed)
[place] LEVEL OF CARE SCREENING TOOL     IDENTIFICATION  Patient Name: Tricia Harrison Birthdate: [birth year] Sex: female Admission Date (Current Location): 01/10/2022  [facility] and [place] Number:  Herbalist and Address:  [facility],  [place], [place]      Provider Number: [id number]  Attending Physician Name and Address:  Michael Boston, MD  Relative Name and Phone Number:  Leotis Pain (son) Ph: [phone]    Current Level of Care: Hospital Recommended Level of Care: [place] Prior Approval Number:    Date Approved/Denied:   PASRR Number: [id number]  Discharge Plan: SNF    Current Diagnoses: Patient Active Problem List   Diagnosis Date Noted   Uses roller walker 01/11/2022   Atherosclerosis of aorta ([place]) 01/11/2022   Chronic kidney disease, stage 3b ([place]) 01/11/2022   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   Orthostatic hypotension 01/11/2022   Leukocytosis 01/11/2022   Chronic cholecystitis 01/10/2022   Obstructive sleep apnea 03/09/2019   Aortic regurgitation 12/02/2018   History of pulmonary embolism 08/20/2018   DOE (dyspnea on exertion)    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   Bilateral ovarian cysts    Hx of gallstones    Hernia, hiatal    Renal cysts, acquired, bilateral    GERD (gastroesophageal reflux disease)    Diverticulosis    Lumbar spinal stenosis    Diabetes mellitus without complication ([place])    Left knee DJD    Rapid palpitations 12/06/2013   Morbid obesity ([place]) 12/06/2013   Fatigue 12/06/2013   PVC (premature ventricular contraction) 12/06/2013   Essential hypertension    Hypothyroidism    Hyperlipidemia LDL goal <100     Orientation RESPIRATION BLADDER Height & Weight     Self, Time, Situation, Place  Normal Continent Weight: 175 lb 11.3 oz (79.7 kg) Height:  5\' 1"  (154.9 cm)  BEHAVIORAL SYMPTOMS/MOOD NEUROLOGICAL BOWEL  NUTRITION STATUS   (N/A)  (N/A) Continent Diet (Soft diet)  AMBULATORY STATUS COMMUNICATION OF NEEDS Skin   Limited Assist Verbally Normal                       Personal Care Assistance Level of Assistance  Bathing, Feeding, Dressing Bathing Assistance: Limited assistance Feeding assistance: Independent Dressing Assistance: Limited assistance     Functional Limitations Info  Sight, Hearing, Speech Sight Info: Adequate Hearing Info: Adequate Speech Info: Adequate    SPECIAL CARE FACTORS FREQUENCY  PT (By licensed PT), OT (By licensed OT)     PT Frequency: 5x's/week OT Frequency: 5x's/week            Contractures Contractures Info: Not present    Additional Factors Info  Code Status, Allergies Code Status Info: Full Allergies Info: Atorvastatin, Codeine, Morphine And Related           Current Medications (01/11/2022):  This is the current hospital active medication list Current Facility-Administered Medications  Medication Dose Route Frequency Provider Last Rate Last Admin   0.9 %  sodium chloride infusion   Intravenous PRN Marylyn Ishihara, Tyrone A, DO 100 mL/hr at 01/11/22 1134 New Bag at 01/11/22 1134   acetaminophen (TYLENOL) tablet 1,000 mg  1,000 mg Oral Q6H Michael Boston, MD   1,000 mg at 01/11/22 1202   albuterol (PROVENTIL) (2.5 MG/3ML) 0.083% nebulizer solution 3 mL  3 mL Inhalation Q6H PRN Michael Boston, MD       apixaban Arne Cleveland) tablet 5  mg  5 mg Oral BID Michael Boston, MD       ascorbic acid (VITAMIN C) tablet 1,000 mg  1,000 mg Oral Daily Michael Boston, MD   1,000 mg at 01/11/22 1007   azaTHIOprine (IMURAN) tablet 50 mg  50 mg Oral Daily Michael Boston, MD       bisacodyl (DULCOLAX) suppository 10 mg  10 mg Rectal Daily PRN Michael Boston, MD       carvedilol (COREG) tablet 6.25 mg  6.25 mg Oral BID WC Michael Boston, MD   6.25 mg at 01/11/22 5361   Chlorhexidine Gluconate Cloth 2 % PADS 6 each  6 each Topical Once Michael Boston, MD       cholecalciferol  (VITAMIN D) tablet 2,000 Units  2,000 Units Oral Daily Michael Boston, MD   2,000 Units at 01/11/22 1007   diltiazem (CARDIZEM CD) 24 hr capsule 360 mg  360 mg Oral Daily Michael Boston, MD   360 mg at 01/11/22 1007   diphenhydrAMINE (BENADRYL) 12.5 MG/5ML elixir 12.5 mg  12.5 mg Oral Q6H PRN Michael Boston, MD       Or   diphenhydrAMINE (BENADRYL) injection 12.5 mg  12.5 mg Intravenous Q6H PRN Michael Boston, MD       dorzolamide-timolol (COSOPT) 22.3-6.8 MG/ML ophthalmic solution 1 drop  1 drop Both Eyes BID Michael Boston, MD   1 drop at 01/11/22 1010   [START ON 01/12/2022] enoxaparin (LOVENOX) injection 40 mg  40 mg Subcutaneous Q24H Michael Boston, MD       fenofibrate tablet 160 mg  160 mg Oral Daily Michael Boston, MD   160 mg at 01/11/22 1007   HYDROmorphone (DILAUDID) injection 0.5-2 mg  0.5-2 mg Intravenous Q4H PRN Michael Boston, MD       ketorolac (ACULAR) 0.5 % ophthalmic solution 1 drop  1 drop Both Eyes QID Michael Boston, MD   1 drop at 01/11/22 1433   latanoprost (XALATAN) 0.005 % ophthalmic solution 1 drop  1 drop Both Eyes QHS Michael Boston, MD   1 drop at 01/10/22 2047   levothyroxine (SYNTHROID) tablet 50 mcg  50 mcg Oral Q0600 Michael Boston, MD   50 mcg at 01/11/22 0511   magnesium hydroxide (MILK OF MAGNESIA) suspension 30 mL  30 mL Oral Daily PRN Michael Boston, MD       methocarbamol (ROBAXIN) tablet 500 mg  500 mg Oral Q6H PRN Michael Boston, MD       metoprolol tartrate (LOPRESSOR) injection 5 mg  5 mg Intravenous Q6H PRN Michael Boston, MD       ondansetron (ZOFRAN-ODT) disintegrating tablet 4 mg  4 mg Oral Q6H PRN Michael Boston, MD       Or   ondansetron [facility]) injection 4 mg  4 mg Intravenous Q6H PRN Michael Boston, MD       oxyCODONE (Oxy IR/ROXICODONE) immediate release tablet 5-10 mg  5-10 mg Oral Q4H PRN Michael Boston, MD       prochlorperazine (COMPAZINE) tablet 10 mg  10 mg Oral Q6H PRN Michael Boston, MD       Or   prochlorperazine (COMPAZINE) injection 5-10 mg  5-10 mg  Intravenous Q6H PRN Michael Boston, MD       ramipril (ALTACE) capsule 2.5 mg  2.5 mg Oral Daily Michael Boston, MD       rosuvastatin (CRESTOR) tablet 20 mg  20 mg Oral QPC supper Michael Boston, MD   20 mg at 01/10/22 1836   simethicone (MYLICON) chewable  tablet 40 mg  40 mg Oral Q6H PRN Michael Boston, MD       traMADol Veatrice Bourbon) tablet 50-100 mg  50-100 mg Oral Q6H PRN Michael Boston, MD         Discharge Medications: Please see discharge summary for a list of discharge medications.  Relevant Imaging Results:  Relevant Lab Results:   Additional Information SSN: [id number]  Sherie Don, LCSW

## 2022-01-11 NOTE — Progress Notes (Addendum)
Tricia Harrison [id number] [birth year]  CARE TEAM:  PCP: Lujean Amel, MD  Outpatient Care Team: Patient Care Team: Lujean Amel, MD as PCP - General (Family Medicine) Leonie Man, MD as PCP - Cardiology (Cardiology) Brand Males, MD as PCP - Pulmonology (Pulmonary Disease) Clarene Essex, MD as Consulting Physician (Gastroenterology) Erline Levine, MD as Consulting Physician (Neurosurgery) Michael Boston, MD as Consulting Physician (General Surgery)  Inpatient Treatment Team: Treatment Team: Attending Provider: Michael Boston, MD; Charge Nurse: Charlyne Petrin, RN; Physical Therapist: Karlton Lemon, PT; Consulting Physician: Edison Pace Md, MD; Utilization Review: Micah Noel, RN; Social Worker: Evans Lance; Pharmacist: Royetta Asal, [facility]; Rounding Team: Jackelyn Knife, MD   Problem List:   Principal Problem:   Chronic cholecystitis Active Problems:   Essential hypertension   Hypothyroidism   Hyperlipidemia LDL goal <100   Fatigue   GERD (gastroesophageal reflux disease)   Diabetes mellitus without complication ([place])   Obstructive sleep apnea   Uses roller walker   1 Day Post-Op  01/10/2022  POST-OPERATIVE DIAGNOSIS:    Chronic [place] cholecystitis Fatty steatohepatitis   PROCEDURE:   Laparoscopic lysis of adhesions x35 minutes (half of case) Laparoscopic cholecystectomy with intraoperative cholangiogram (CPT code 67893) Wedge liver biopsy   SURGEON:  Adin Hector, MD, FACS.   OR FINDINGS:  Moderate adhesions to the gallbladder with some wall thickening consistent with chronic cholecystitis.  Liver with some mild fatty change.  Rather hepatic gallbladder with tongue of liver on and so therefore en bloc wedge liver biopsy done.  Cholangiogram showing rather classic biliary anatomy with no choledocholithiasis leak or stricture.   Moderate periumbilical and infraumbilical omental adhesions limiting capacity to do single site  cholecystectomy.  Therefore standard four-port done.  Inspection confirmed adhesions to the anterior abdominal wall were all omental.   Liver: Fatty steatohepatitis   Assessment  Gradually covering from cholecystectomy.  Some lightheadedness and question of near syncopal event.  Desired by family and patient to attempt rehab/skilled facility first.  [facility] Stay = 0 days)  Plan:  - She claims some dysphagia with food sticking.  We will ask speech therapy to evaluate to make sure she does not have any significant dysphagia issues.  Transition back to dysphagia 1 for now until evaluation  -Physical Occupational Therapy evaluations.  Preliminary evaluation by Occupational Therapy is that patient would benefit from rehab at a skilled facility.  [facility] hospitalist medicine consultation to make sure there are no other concerns for etiology of her weakness and lightheadedness.  History of hypertension.  Orthostatic blood pressure check noted some mild drop with standing.  We will give 1 L IV fluid.  Consider holding on some of her blood pressure medicines.  Add parameters to hold her ramipril but keep her Coreg.  We will see what medicine feels the safest.  Sounds like her nephrologist, Dr. Moshe Cipro, has recommended ACE inhibitor ramipril and has been tapering down to be safe.  GERD.  PPI  Hypothyroidism.  Levothyroxine.  Sleep apnea.  Supplemental oxygen as needed.  -VTE prophylaxis- SCDs, etc.  Lovenox.  OK to restart eliquis POD#1  -mobilize as tolerated to help recovery.  Limited mobility with at least 1 if not 2 person assist to ambulate.  Disposition:  Disposition:  The patient is from: Home  Anticipate discharge to:  [place] (SNF)  Anticipated Date of Discharge is:  February 20,2023    Barriers to discharge:  Pending Clinical improvement (more likely than not)  Patient  currently is NOT MEDICALLY STABLE for discharge from the hospital from a surgery  standpoint.      45 minutes spent in review, evaluation, examination, counseling, and coordination of care.   I have reviewed this patient's available data, including medical history, events of note, physical examination and test results as part of my evaluation.  A significant portion of that time was spent in counseling.  Care during the described time interval was provided by me.  01/11/2022    Subjective: (Chief complaint)  Patient with some lightheadedness and slumped down when trying to walk yesterday.  Not as lightheaded today but up in chair.  Denies any joint pain.  No abdominal pain.  Tried to take something by mouth but felt food sticking which concerned her.  No shortness of breath or cough.  Claims she usually does not have such issues.  Objective:  Vital signs:  Vitals:   01/11/22 0200 01/11/22 0532 01/11/22 0959 01/11/22 1001  BP: (!) 165/69 (!) 155/70 (!) 149/71 127/70  Pulse: 62 62 68 81  Resp: 16 16 18 18   Temp: 98.4 F (36.9 C) 99 F (37.2 C) 98 F (36.7 C) 98 F (36.7 C)  TempSrc: Oral Oral    SpO2: 99% 99% 100% 100%  Weight:      Height:        Last BM Date : 01/09/22  Intake/Output   Yesterday:  02/16 0701 - 02/17 0700 In: 3079.9 [P.O.:1080; I.V.:1899.9; IV Piggyback:100] Out: 650 [Urine:650] This shift:  No intake/output data recorded.  Bowel function:  Flatus: YES  BM:  No  Drain: (No drain)   Physical Exam:  General: Pt awake/alert in no acute distress Eyes: PERRL, normal EOM.  Sclera clear.  No icterus Neuro: CN II-XII intact w/o focal sensory/motor deficits. Lymph: No head/neck/groin lymphadenopathy Psych:  No delerium/psychosis/paranoia.  Oriented x 4 HENT: Normocephalic, Mucus membranes moist.  No thrush Neck: Supple, No tracheal deviation.  No obvious thyromegaly Chest: No pain to chest wall compression.  Good respiratory excursion.  No audible wheezing CV:  Pulses intact.  Regular rhythm.  No major extremity  edema MS: Normal AROM mjr joints.  No obvious deformity  Abdomen: Soft.  Nondistended.  Nontender.  No evidence of peritonitis.  Dressings clean dry and intact no incarcerated hernias.  Ext:   No deformity.  No mjr edema.  No cyanosis Skin: No petechiae / purpurea.  No major sores.  Warm and dry    Results:   Cultures: Recent Results (from the past 720 hour(s))  SARS CORONAVIRUS 2 (TAT 6-24 HRS) Nasopharyngeal Nasopharyngeal Swab     Status: None   Collection Time: 01/08/22  6:55 AM   Specimen: Nasopharyngeal Swab  Result Value Ref Range Status   SARS Coronavirus 2 NEGATIVE NEGATIVE Final    Comment: (NOTE) SARS-CoV-2 target nucleic acids are NOT DETECTED.  The SARS-CoV-2 RNA is generally detectable in upper and lower respiratory specimens during the acute phase of infection. Negative results do not preclude SARS-CoV-2 infection, do not rule out co-infections with other pathogens, and should not be used as the sole basis for treatment or other patient management decisions. Negative results must be combined with clinical observations, patient history, and epidemiological information. The expected result is Negative.  Fact Sheet for Patients: SugarRoll.be  Fact Sheet for Healthcare Providers: https://www.woods-mathews.com/  This test is not yet approved or cleared by the [place] FDA and  has been authorized for detection and/or diagnosis of SARS-CoV-2 by FDA under an Emergency  Use Authorization (EUA). This EUA will remain  in effect (meaning this test can be used) for the duration of the COVID-19 declaration under Se ction 564(b)(1) of the Act, 21 U.S.C. section 360bbb-3(b)(1), unless the authorization is terminated or revoked sooner.  Performed at [facility], [place] [address], [place], [place] [address]     Labs: Results for orders placed or performed during the hospital encounter of 01/10/22 (from the past 48  hour(s))  Glucose, capillary     Status: None   Collection Time: 01/10/22  5:41 AM  Result Value Ref Range   Glucose-Capillary 91 70 - 99 mg/dL    Comment: Glucose reference range applies only to samples taken after fasting for at least 8 hours.  Glucose, capillary     Status: Abnormal   Collection Time: 01/10/22  9:31 AM  Result Value Ref Range   Glucose-Capillary 146 (H) 70 - 99 mg/dL    Comment: Glucose reference range applies only to samples taken after fasting for at least 8 hours.  Basic metabolic panel     Status: Abnormal   Collection Time: 01/11/22  4:17 AM  Result Value Ref Range   Sodium 133 (L) 135 - 145 mmol/L   Potassium 3.8 3.5 - 5.1 mmol/L   Chloride 104 98 - 111 mmol/L   CO2 23 22 - 32 mmol/L   Glucose, Bld 142 (H) 70 - 99 mg/dL    Comment: Glucose reference range applies only to samples taken after fasting for at least 8 hours.   BUN 21 8 - 23 mg/dL   Creatinine, Ser 1.43 (H) 0.44 - 1.00 mg/dL   Calcium 8.4 (L) 8.9 - 10.3 mg/dL   GFR, Estimated 38 (L) >60 mL/min    Comment: (NOTE) Calculated using the CKD-EPI Creatinine Equation (2021)    Anion gap 6 5 - 15    Comment: Performed at [facility], [place] [address] [place], [place] [address]  Magnesium     Status: None   Collection Time: 01/11/22  4:17 AM  Result Value Ref Range   Magnesium 1.7 1.7 - 2.4 mg/dL    Comment: Performed at [facility], [place] [address], [place], [place] [address]  CBC     Status: Abnormal   Collection Time: 01/11/22  4:17 AM  Result Value Ref Range   WBC 15.9 (H) 4.0 - 10.5 K/uL   RBC 2.81 (L) 3.87 - 5.11 MIL/uL   Hemoglobin 9.6 (L) 12.0 - 15.0 g/dL   HCT 27.9 (L) 36.0 - 46.0 %   MCV 99.3 80.0 - 100.0 fL   MCH 34.2 (H) 26.0 - 34.0 pg   MCHC 34.4 30.0 - 36.0 g/dL   RDW 14.2 11.5 - 15.5 %   Platelets 285 150 - 400 K/uL   nRBC 0.0 0.0 - 0.2 %    Comment: Performed at [facility], [place] [address], [place], [place] [address]     Imaging / Studies: DG Cholangiogram Operative  Result Date: 01/10/2022 CLINICAL DATA:  Intraoperative cholangiogram EXAM: INTRAOPERATIVE CHOLANGIOGRAM TECHNIQUE: Cholangiographic images from the C-arm fluoroscopic device were submitted for interpretation post-operatively. Please see the procedural report for the amount of contrast and the fluoroscopy time utilized. FLUOROSCOPY: Refer to operative report COMPARISON:  None. FINDINGS: A single fluoroscopic loop is submitted for review taken during intraoperative cholangiogram. Image demonstrates surgical instrumentation and surgical clips overlying the gallbladder fossa. Contrast injection opacifies both the intrahepatic and extrahepatic biliary tree. The bile ducts  are normal in caliber without obvious filling defect. There is prompt passage of contrast material through the ampulla into the small bowel. IMPRESSION: Intraoperative cholangiogram as described. Refer to operative report for full details. Electronically Signed   By: Albin Felling M.D.   On: 01/10/2022 09:07    Medications / Allergies: per chart  Antibiotics: Anti-infectives (From admission, onward)    Start     Dose/Rate Route Frequency Ordered Stop   01/10/22 0600  cefTRIAXone (ROCEPHIN) 2 g in sodium chloride 0.9 % 100 mL IVPB        2 g 200 mL/hr over 30 Minutes Intravenous On call to O.R. 01/10/22 3662 01/10/22 9476         Note: Portions of this report may have been transcribed using voice recognition software. Every effort was made to ensure accuracy; however, inadvertent computerized transcription errors may be present.   Any transcriptional errors that result from this process are unintentional.    Adin Hector, MD, FACS, MASCRS Esophageal, Gastrointestinal & Colorectal Surgery Robotic and Minimally Invasive Surgery  [facility], [place]  [place]. [address] [place], [place] [address] [phone] Fax [phone] Main  CONTACT INFORMATION:  Weekday (9AM-5PM): Call [facility] main office at [phone]  Weeknight (5PM-9AM) or Weekend/Holiday: Check [email or web address] (password " [email or web address]") for General Surgery [facility] coverage  (Please, do not use SecureChat as it is not reliable communication to operating surgeons for immediate patient care)      01/11/2022  11:20 AM

## 2022-01-11 NOTE — Consult Note (Signed)
Initial Consultation Note   Patient: Tricia Harrison MOQ:[id number] DOB: October 28, 1946 PCP: Lujean Amel, MD DOA: 01/10/2022 DOS: the patient was seen and examined on 01/11/2022 Primary service: Michael Boston, MD  Referring physician: Dr. Johney Maine Reason for consult: lightheadedness, weakness  Assessment/Plan: Assessment and Plan: No notes have been filed under this hospital service. Service: Hospitalist Orthostatic hypotension     - she is admitted to the surgery surgery now s/p cholecystectomy     - sounds like she has been dealing with this dizziness for some time as there have been several medication adjustments recently between multiple outpt docs revolving around her BP meds and her dizziness     - she is orthostatic positive     - we will give her a little fluids and add TED hose     - check UA to make sure she isn't having symptomatic UTI (c/o increased frequency)  Leukocytosis     - likely reactive to surgery     - will check UA  CKD3b     - she is at baseline, watch nephrotoxins  Remainder per primary team.  TRH will continue to follow the patient.  HPI: Tricia Harrison is a 76 y.o. 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 Presenting to surgery service with abdominal pain. 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 She has successfully completed a lap chole. Today it was noted that the patient was a little lightheaded and weak. TRH was consulted for evaluation. The patient notes that she has had this feeling of lightheadedness and weakness for several weeks. It seems to be worse when she first wakes up and tries to get up. Shortly after transitioning from the bed to standing, she will feel unsteady and lightheaded. As she gets moving around, she tends to feel better. She seems to have most of her problem shortly after getting up to stand. She notes that several of  her outpt docs have been adjusting her BP meds d/t this symptom. She also reports that she's had a recent fall in the setting of these symptoms. She denies any other aggravating or alleviating factors.   Review of Systems: As mentioned in the history of present illness. All other systems reviewed and are negative. Past Medical History:  Diagnosis Date   Anemia    as a child   Asthma    as a child   Chronic back pain    spinal stenosis and buldging disc;scoliosis   Chronic constipation    occasionally takes something OTC   Chronic kidney disease    Diabetes mellitus without complication ([place])    per pt "Pre" for 20+yrs   Diverticulosis    DVT (deep venous thrombosis) ([facility])    Gallstones    has known about this for 3-31yrs   GERD (gastroesophageal reflux disease)    takes Omeprazole daily   H/O hiatal hernia    Heart murmur    Hemorrhoids    History of blood transfusion    no abnormal reaction noted   History of bronchitis    states its been a long time ago   History of gout    HLD (hyperlipidemia)    takes Fenofibrate daily   HTN (hypertension)    takes Diltiazem and Ramipril daily   Hypothyroidism    takes Synthroid daily   Left knee DJD    Nocturia    Palpitations  started in 2013 and only occasionally;last time noticed about 2-3wks ago and after drinking caffeine   PONV (postoperative nausea and vomiting)    Pulmonary embolism with acute cor pulmonale ([place]) 07/2018   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   Past Surgical History:  Procedure Laterality Date   3-day EVENT MONITOR  01/2019   Mostly normal monitor.  Predominantly sinus rhythm (rate range 48-105 bpm, average 67 bpm) 9 short atrial runs (fastest = 13 beats rate 135 bpm, longest ~17 seconds-101 bpm) -> not noted on symptom log.  No sustained arrhythmias (tachycardia or bradycardia), or pauses..  Symptoms are noted with PVCs.   CHOLECYSTECTOMY  N/A 01/10/2022   Procedure: LAPAROSCOPIC CHOLECYSTECTOMY WITH INTRAOPERATIVE CHOLANGIOGRAM;  Surgeon: Michael Boston, MD;  Location: [facility];  Service: General;  Laterality: N/A;   COLONOSCOPY     CT ANGIOGRAM OF CHEST - PE PROTOCOL  07/2018   Bilateral nonocclusive pulmonary emboli evidence of right heart strain consistent with "submassive "/intermediate PE.  -->  Code PE called.   DILATION AND CURETTAGE OF UTERUS     multiple about 6-7 times   ESOPHAGOGASTRODUODENOSCOPY     NM VQ LUNG SCAN ([facility] HX)  11/23/2018   Low probability for PE   RIGHT/LEFT HEART CATH AND CORONARY ANGIOGRAPHY N/A 08/19/2018   Procedure: RIGHT/LEFT HEART CATH AND CORONARY ANGIOGRAPHY;  Surgeon: Jettie Booze, MD;  Location: [place] [facility];  Service: Cardiovascular:: Nonobstructive CAD.  Mild pulmonary pretension.  Mean PA pressure 26 mmHg with PA P 48/20 mmHg.  Normal LVEDP.   THYROID SURGERY Right 2010   TOTAL HIP ARTHROPLASTY Left 2005   TOTAL KNEE ARTHROPLASTY Left 02/21/2014   Procedure: TOTAL KNEE ARTHROPLASTY;  Surgeon: Lorn Junes, MD;  Location: [place];  Service: Orthopedics;  Laterality: Left;   TRANSTHORACIC ECHOCARDIOGRAM  07/2018    (In setting of acute PE) mild LVH.  EF 55 to 60%.  GR 1 DD.  Aortic sclerosis but no stenosis.  Mild regurgitation.  Mild RA dilation.  Moderate LA dilation.  Moderate tricuspid regurgitation with severe primary hypertension estimated PA pressure 71 mmHg.  RV poorly visualized   TRANSTHORACIC ECHOCARDIOGRAM  10/2018 - 11/2019   a) EF 60-65%.  GR 1 DD.  Focal basal LVH. Mod AI/ no AS. Mild LA dilation.  Normal RV size and fxn.  Mod PI w/ mild TR.  Peak PAP ~ 38 mmHg; b) Normal EF 60 to 65%.  Moderate LVH-GR 1 DD.  R WMA.  Mild AS w/ trivial AI.  Grossly normal PA, RV and RA size.  Normal RV function.  Normal PA pressures.   TRANSTHORACIC ECHOCARDIOGRAM  01/2021    EF 55%.  Normal LV function.  Mild LVH.  GR 1 DD.  Normal PA pressures.  Severe LA dilation.  (Consistent with  more significant diastolic dysfunction).  Mild-possibly mild to moderate aortic insufficiency.  Borderline elevated RAP.  Trivial pericardial effusion.  (Stable)   VAGINAL HYSTERECTOMY  1979   Social History:  reports that she quit smoking about 28 years ago. Her smoking use included cigarettes. She started smoking about 57 years ago. She has a 10.00 pack-year smoking history. She has never used smokeless tobacco. She reports current alcohol use. She reports that she does not use drugs.  Allergies  Allergen Reactions   Atorvastatin Other (See Comments)    Muscle pain   Codeine Nausea And Vomiting  Morphine And Related     B/p drops     Family History  Problem Relation Age of Onset   Uterine cancer Mother    Heart disease Mother    Hypertension Mother    Diabetes Mother    Cirrhosis Father    Pneumonia Father    Lymphoma Sister        ,non hodgkin    Diabetes Sister    Breast cancer Neg Hx     Prior to Admission medications   Medication Sig Start Date End Date Taking? Authorizing Provider  acetaminophen (TYLENOL) 500 MG tablet Take 1,000 mg by mouth every 6 (six) hours as needed for mild pain.   Yes [provider]  albuterol (VENTOLIN HFA) 108 (90 Base) MCG/ACT inhaler Inhale 2 puffs into the lungs every 6 (six) hours as needed. 07/02/19  Yes Lauraine Rinne, NP  apixaban (ELIQUIS) 5 MG TABS tablet TAKE 1 TABLET BY MOUTH  TWICE DAILY 09/21/21  Yes Leonie Man, MD  Ascorbic Acid (VITAMIN C) 1000 MG tablet Take 1,000 mg by mouth daily.   Yes [provider]  azaTHIOprine (IMURAN) 50 MG tablet Take 50 mg by mouth daily. 08/08/20  Yes [provider]  bimatoprost (LUMIGAN) 0.01 % SOLN Place 1 drop into both eyes at bedtime. 02/06/21  Yes [provider]  carvedilol (COREG) 6.25 MG tablet TAKE 1 TABLET BY MOUTH TWO  TIMES DAILY WITH A MEAL 04/23/19  Yes Leonie Man, MD  Cholecalciferol (VITAMIN D3) 2000 UNITS TABS Take 2,000 Units by mouth  daily.   Yes [provider]  diltiazem (CARDIZEM LA) 360 MG 24 hr tablet Take 360 mg by mouth daily.   Yes [provider]  dorzolamide-timolol (COSOPT) 22.3-6.8 MG/ML ophthalmic solution Place 1 drop into both eyes 2 times daily. 11/07/20  Yes [provider]  fenofibrate (TRICOR) 145 MG tablet Take 145 mg by mouth daily.   Yes [provider]  HUMIRA PEN 40 MG/0.4ML PNKT Inject 40 mg into the skin every 14 (fourteen) days. 01/22/21  Yes [provider]  ketorolac (ACULAR) 0.5 % ophthalmic solution Place 1 drop into both eyes 4 (four) times daily. Verified correct strength per Ophthalmalogist progress notes   Yes [provider]  levothyroxine (SYNTHROID, LEVOTHROID) 50 MCG tablet Take 50 mcg by mouth daily before breakfast.   Yes [provider]  omeprazole (PRILOSEC) 20 MG capsule Take 20 mg by mouth daily.   Yes [provider]  ondansetron (ZOFRAN) 4 MG tablet Take 1 tablet (4 mg total) by mouth every 8 (eight) hours as needed for nausea. 01/10/22  Yes Michael Boston, MD  polyethylene glycol (MIRALAX / GLYCOLAX) 17 g packet Take 17 g by mouth daily as needed for mild constipation.   Yes [provider]  ramipril (ALTACE) 5 MG capsule Take 1 capsule (5 mg total) by mouth daily. Patient taking differently: Take 5 mg by mouth daily. Pt. Is taking 2.5 Daily 11/09/21  Yes Cleaver, Jossie Ng, NP  rosuvastatin (CRESTOR) 20 MG tablet TAKE 1 TABLET BY MOUTH  DAILY AT 6 PM. 05/25/21  Yes Leonie Man, MD  traMADol (ULTRAM) 50 MG tablet Take 1-2 tablets (50-100 mg total) by mouth every 6 (six) hours as needed for moderate pain or severe pain. 01/10/22  Yes Michael Boston, MD  COVID-19 mRNA vaccine, Moderna, 100 MCG/0.5ML injection Inject into the muscle. 08/30/21   Carlyle Basques, MD    Physical Exam: Vitals:   01/11/22  0200 01/11/22 0532 01/11/22 0959 01/11/22 1001  BP: (!) 165/69 (!) 155/70 (!) 149/71 127/70  Pulse: 62 62  68 81  Resp: 16 16 18 18   Temp: 98.4 F (36.9 C) 99 F (37.2 C) 98 F (36.7 C) 98 F (36.7 C)  TempSrc: Oral Oral    SpO2: 99% 99% 100% 100%  Weight:      Height:       General: 76 y.o. female resting in chair in NAD Eyes: PERRL, normal sclera ENMT: Nares patent w/o discharge, orophaynx clear, dentition normal, ears w/o discharge/lesions/ulcers Neck: Supple, trachea midline Cardiovascular: RRR, +S1, S2, no m/g/r, equal pulses throughout Respiratory: CTABL, no w/r/r, normal WOB GI: BS+, NDNT, no masses noted, no organomegaly noted MSK: No e/c/c Neuro: A&O x 3, no focal deficits Psyc: Appropriate interaction and affect, calm/cooperative  Data Reviewed:   Na+ 133 Glucose 142 Scr 1.43 WBC 15.9 Hgb 9.6    Family Communication: w/ family at bedside Thank you very much for involving [place] in the care of your patient.  Author: Jonnie Finner, DO 01/11/2022 11:44 AM  For on call review [email or web address].

## 2022-01-12 DIAGNOSIS — K811 Chronic cholecystitis: Secondary | ICD-10-CM

## 2022-01-12 LAB — COMPREHENSIVE METABOLIC PANEL
ALT: 43 U/L (ref 0–44)
AST: 94 U/L — ABNORMAL HIGH (ref 15–41)
Albumin: 3.1 g/dL — ABNORMAL LOW (ref 3.5–5.0)
Alkaline Phosphatase: 23 U/L — ABNORMAL LOW (ref 38–126)
Anion gap: 6 (ref 5–15)
BUN: 21 mg/dL (ref 8–23)
CO2: 23 mmol/L (ref 22–32)
Calcium: 8.5 mg/dL — ABNORMAL LOW (ref 8.9–10.3)
Chloride: 110 mmol/L (ref 98–111)
Creatinine, Ser: 1.25 mg/dL — ABNORMAL HIGH (ref 0.44–1.00)
GFR, Estimated: 45 mL/min — ABNORMAL LOW (ref >60)
Glucose, Bld: 80 mg/dL (ref 70–99)
Potassium: 3.6 mmol/L (ref 3.5–5.1)
Sodium: 139 mmol/L (ref 135–145)
Total Bilirubin: 0.3 mg/dL (ref 0.3–1.2)
Total Protein: 5.7 g/dL — ABNORMAL LOW (ref 6.5–8.1)

## 2022-01-12 LAB — MAGNESIUM: Magnesium: 1.9 mg/dL (ref 1.7–2.4)

## 2022-01-12 LAB — POTASSIUM: Potassium: 3.6 mmol/L (ref 3.5–5.1)

## 2022-01-12 LAB — CBC
HCT: 29.6 % — ABNORMAL LOW (ref 36.0–46.0)
Hemoglobin: 10 g/dL — ABNORMAL LOW (ref 12.0–15.0)
MCH: 33.7 pg (ref 26.0–34.0)
MCHC: 33.8 g/dL (ref 30.0–36.0)
MCV: 99.7 fL (ref 80.0–100.0)
Platelets: 307 10*3/uL (ref 150–400)
RBC: 2.97 MIL/uL — ABNORMAL LOW (ref 3.87–5.11)
RDW: 14.4 % (ref 11.5–15.5)
WBC: 12.3 10*3/uL — ABNORMAL HIGH (ref 4.0–10.5)
nRBC: 0 % (ref 0.0–0.2)

## 2022-01-12 MED ORDER — HYDRALAZINE HCL 20 MG/ML IJ SOLN: 5.0000 mg | Freq: Four times a day (QID) | INTRAMUSCULAR | Status: DC | PRN

## 2022-01-12 MED ORDER — SODIUM CHLORIDE 0.9 % IV SOLN: 2.0000 g | INTRAVENOUS | Status: DC

## 2022-01-12 MED ORDER — DILTIAZEM HCL ER COATED BEADS 240 MG PO CP24: 240.0000 mg | ORAL_CAPSULE | Freq: Every day | ORAL | Status: DC

## 2022-01-12 NOTE — Progress Notes (Signed)
Occupational Therapy Treatment Patient Details Name: Tricia Harrison MRN: [id number] DOB: Aug 13, 1946 Today's Date: 01/12/2022   History of present illness Patient is a 76 year old woman s/p lap chole on 01/10/22.  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 Patient was educated multiple times on importance of getting up with nursing staff and moving during the day, and importance of being out of bed. Patient verbalized understanding. Patients sister was present during session. Patient would continue to benefit from skilled OT services at this time while admitted and after d/c to address noted deficits in order to improve overall safety and independence in ADLs.     Recommendations for follow up therapy are one component of a multi-disciplinary discharge planning process, led by the attending physician.  Recommendations may be updated based on patient status, additional functional criteria and insurance authorization.    Follow Up Recommendations  Skilled nursing-short term rehab (<3 hours/day)    Assistance Recommended at Discharge Frequent or constant Supervision/Assistance  Patient can return home with the following  A little help with walking and/or transfers;A lot of help with bathing/dressing/bathroom;Assistance with cooking/housework   Equipment Recommendations  None recommended by OT    Recommendations for Other Services      Precautions / Restrictions Precautions Precautions: Fall Precaution Comments: reports 2 falls this week Restrictions Weight Bearing Restrictions: No       Mobility Bed Mobility Overal bed mobility: Needs Assistance Bed Mobility: Supine to Sit     Supine to sit: HOB elevated, Mod assist     General  bed mobility comments: patient needed physical assistance for getting BLE to edge of bed and scooting to edge of bed with increased time.    Transfers                         Balance Overall balance assessment: Needs assistance Sitting-balance support: No upper extremity supported Sitting balance-Leahy Scale: Good     Standing balance support: Reliant on assistive device for balance, Single extremity supported Standing balance-Leahy Scale: Poor                             ADL either performed or assessed with clinical judgement   ADL Overall ADL's : Needs assistance/impaired                       Lower Body Dressing Details (indicate cue type and reason): attempted to educate on reacher to doff socks when patient was unable to bring foot to lap. patient was brought into long winded conversation on how she typically does things at home. Toilet Transfer: Optician, dispensing (2 wheels) Toilet Transfer Details (indicate cue type and reason): with increased time and cues for proper hand and foot positioning. Toileting- Clothing Manipulation and Hygiene: Minimal assistance;Sit to/from stand Toileting - Clothing Manipulation Details (indicate cue type and reason): patient was noted to stand with flexed trunk posture to complete hygiene tasks with min A to maintain standing balance with RW     Functional mobility during ADLs: Min guard;Rolling walker (2 wheels) General ADL Comments: patient was able to participate in functional mobility from 3 in 1 commode to  over 30 feet into hallway to increase time out of bed and ability to engage in functional activity. patient had family rolling recliner behind patient with patient nervous about recent incident with NT where legs got weak. no weakness or LOB noted on this date.    Extremity/Trunk Assessment              Vision       Perception     Praxis      Cognition Arousal/Alertness:  Awake/alert Behavior During Therapy: WFL for tasks assessed/performed Overall Cognitive Status: Within Functional Limits for tasks assessed                                 General Comments: patient was noted to give long history of ailments with perceived difficulty with insight to deficits.        Exercises      Shoulder Instructions       General Comments      Pertinent Vitals/ Pain       Pain Assessment Pain Assessment: No/denies pain  Home Living                                          Prior Functioning/Environment              Frequency  Min 2X/week        Progress Toward Goals  OT Goals(current goals can now be found in the care plan section)  Progress towards OT goals: Progressing toward goals     Plan Discharge plan remains appropriate    Co-evaluation                 AM-PAC OT "6 Clicks" Daily Activity     Outcome Measure   Help from another person eating meals?: None Help from another person taking care of personal grooming?: A Little Help from another person toileting, which includes using toliet, bedpan, or urinal?: A Lot Help from another person bathing (including washing, rinsing, drying)?: A Lot Help from another person to put on and taking off regular upper body clothing?: A Little Help from another person to put on and taking off regular lower body clothing?: A Lot 6 Click Score: 16    End of Session Equipment Utilized During Treatment: Gait belt;Rolling walker (2 wheels)  OT Visit Diagnosis: Muscle weakness (generalized) (M62.81)   Activity Tolerance Patient tolerated treatment well   Patient Left in chair;with chair alarm set;with family/visitor present   Nurse Communication Mobility status        Time: 2956-2130 OT Time Calculation (min): 41 min  Charges: OT General Charges $OT Visit: 1 Visit OT Treatments $Self Care/Home Management : 23-37 mins $Therapeutic Activity: 8-22  mins  Jackelyn Poling OTR/L, MS Acute Rehabilitation Department Office# [phone] Pager# [phone]   Tricia Harrison 01/12/2022, 4:51 PM

## 2022-01-12 NOTE — Hospital Course (Addendum)
101/61       BP- Lying                     Pulse- Lying                71       Pulse- Lying    BP- Sitting                111/72       BP- Sitting    Pulse- Sitting                78       Pulse- Sitting    BP- Standing at 0 minutes                99/61       BP- Standing at 0 minutes    Pulse- Standing at 0 minutes                79       Pulse- Standing at 0 minutes    BP- Standing at 3 minutes                95/48       BP- Standing at 3 minutes    Pulse- Standing at 3 minutes                67

## 2022-01-12 NOTE — Progress Notes (Addendum)
Consultation Progress Note   Patient: Tricia Harrison [id number]:[id number] DOB: September 19, 1946 DOA: 01/10/2022 DOS: the patient was seen and examined on 01/12/2022 Primary service: Michael Boston, MD  Brief hospital course: 76 year old black female DM TY 2, HTN, HL D, reflux, hypothyroid cardiac cath 2019 pulmonary hypertension nonobstructive CAD Left lower extremity DVT 07/2018 admission CKD 3B, HLD, HTN  Admit to 2.16.23 symptomatic biliary colic-status post lap chole 01/10/2022-felt lightheaded weak and patient was orthostatic  Postoperatively noted slight rise in her BUN/creatinine above baseline of 1.2-21/1.4 WBC is trended down postoperatively to 12.3, hemoglobin is 10.0 CT head 2/17 showed partial empty sella UA trace leukocytes negative nitrite--no urine culture was obtained?  Assessment and Plan:  Lap chole 2/16 Dr. Johney Maine Per attending service pain control etc. Unlikely UTI Received 1 dose Rocephin--no urine culture collected--no dysuria no frequency and has accidents at home pointing to probable urge incontinence Hold medications for this at this time-up to the bedside commode with me this morning and voided 100 cc, PVR 29 on bladder scan We will consider Ditropan in the morning Orthostasis confirmed CT head rules out stroke Has been on blood pressure meds since she had preeclampsia at age 30 She is willing to have me adjust her meds Current meds = Coreg 6.25 twice daily Cardizem down titrated from 360---240  Recheck [facility]VS q shift 2019 pulmonary hypertension based off of cath Med control weight loss etc. DVT 9/19 admission Cautious continuation of Eliquis-May be time to discuss discontinuation of this and only aspirin as an outpatient with her PCP?  This is about 2 years ago Retinopathy Continue Imuran-needs strict blood pressure control to delay progression      TRH will continue to follow the patient.  Subjective: States dizziness happens all the time in the morning when  she first wakes up She is orthostatic usually She tells me she has had several people adjust her blood pressure meds she has had blood pressure since age of 33 She is worried about some mild lower extremity swelling but fluids have been discontinued She does not feel dizzy today was able to get up to bedside commode I have asked that nursing help her toileting hygiene-I do not think a PureWick is a good idea as it will decrease her mobility postprocedure If she has a fever chills or another white count we can obviously resume her Rocephin--- she is not having any symptoms of cystitis so I will consider Ditropan in the morning if she continues to do well  Physical Exam: Vitals:   01/11/22 1242 01/11/22 2131 01/12/22 0525 01/12/22 0813  BP: (!) 152/67 (!) 160/73 (!) 155/71 (!) 162/72  Pulse: (!) 59 (!) 50 65 (!) 59  Resp: 18 16 16 18   Temp: 98.6 F (37 C) 98.1 F (36.7 C) 98 F (36.7 C) 98.5 F (36.9 C)  TempSrc:  Oral Oral Oral  SpO2: 99% 100% 100% 100%  Weight:      Height:       EOMI NCAT no focal deficit thick neck Mallampati 4 slightly stooped and kyphotic ROM intact no focal deficit CTA B no added sound rales rhonchi Sinus rhythm on exam Neurologically intact moving 4 limbs equally although slow-has some lower extremity swelling  Data Reviewed:  leukocytosis improving , BUN/creatinine improving LFTs trending down  Time spent: 65 minutes minutes.  Author: Nita Sells, MD 01/12/2022 10:21 AM  For on call review [email or web address].

## 2022-01-12 NOTE — Progress Notes (Signed)
2 Days Post-Op   Chief Complaint/Subjective: Complains of swallowing issues, difficulty controlling her bladder and frequently urinated on herself. Slurred speech yesterday with negative head CT.  Objective: Vital signs in last 24 hours: Temp:  [98 F (36.7 C)-98.6 F (37 C)] 98.5 F (36.9 C) (02/18 0813) Pulse Rate:  [50-81] 59 (02/18 0813) Resp:  [16-18] 18 (02/18 0813) BP: (127-162)/(67-73) 162/72 (02/18 0813) SpO2:  [99 %-100 %] 100 % (02/18 0813) Last BM Date : 01/09/22 Intake/Output from previous day: 02/17 0701 - 02/18 0700 In: 1571.5 [P.O.:960; I.V.:611.5] Out: 1700 [Urine:1700] Intake/Output this shift: Total I/O In: 120 [P.O.:120] Out: -   PE: Gen: NAD Resp: nonlabored Card: bradycardic Abd: soft, Nt, ND  Lab Results:  Recent Labs    01/11/22 0417 01/12/22 0655  WBC 15.9* 12.3*  HGB 9.6* 10.0*  HCT 27.9* 29.6*  PLT 285 307   BMET Recent Labs    01/11/22 0417 01/12/22 0655 01/12/22 0804  NA 133*  --  139  K 3.8 3.6 3.6  CL 104  --  110  CO2 23  --  23  GLUCOSE 142*  --  80  BUN 21  --  21  CREATININE 1.43*  --  1.25*  CALCIUM 8.4*  --  8.5*   PT/INR No results for input(s): LABPROT, INR in the last 72 hours. CMP     Component Value Date/Time   NA 139 01/12/2022 0804   NA 142 10/08/2018 0929   K 3.6 01/12/2022 0804   CL 110 01/12/2022 0804   CO2 23 01/12/2022 0804   GLUCOSE 80 01/12/2022 0804   BUN 21 01/12/2022 0804   BUN 28 (H) 10/08/2018 0929   CREATININE 1.25 (H) 01/12/2022 0804   CALCIUM 8.5 (L) 01/12/2022 0804   PROT 5.7 (L) 01/12/2022 0804   PROT 5.9 (L) 10/08/2018 0929   ALBUMIN 3.1 (L) 01/12/2022 0804   ALBUMIN 4.0 10/08/2018 0929   AST 94 (H) 01/12/2022 0804   ALT 43 01/12/2022 0804   ALKPHOS 23 (L) 01/12/2022 0804   BILITOT 0.3 01/12/2022 0804   BILITOT 0.4 10/08/2018 0929   GFRNONAA 45 (L) 01/12/2022 0804   GFRAA 48 (L) 10/08/2018 0929   Lipase  No results found for: LIPASE  Studies/Results: CT HEAD WO  CONTRAST (5MM)  Result Date: 01/11/2022 CLINICAL DATA:  Altered mental status.  Cholecystectomy yesterday. EXAM: CT HEAD WITHOUT CONTRAST TECHNIQUE: Contiguous axial images were obtained from the base of the skull through the vertex without intravenous contrast. RADIATION DOSE REDUCTION: This exam was performed according to the departmental dose-optimization program which includes automated exposure control, adjustment of the mA and/or kV according to patient size and/or use of iterative reconstruction technique. COMPARISON:  None. FINDINGS: Brain: Scratch no intracranial hemorrhage, mass effect, or midline shift. No hydrocephalus. The basilar cisterns are patent. Brain volume is normal for age. There is mild periventricular and deep white matter hypodensity. 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 No evidence of territorial infarct or acute ischemia. Enlarged partially empty sella. No extra-axial or intracranial fluid collection. Vascular: Atherosclerosis of skullbase vasculature without hyperdense vessel or abnormal calcification. Skull: No fracture or focal lesion.  Hyperostoses frontalis. Sinuses/Orbits: Paranasal sinuses and mastoid air cells are clear. The visualized orbits are unremarkable. Bilateral cataract resection. Other: None. IMPRESSION: 1. No acute intracranial abnormality. 2. Mild chronic small vessel ischemic disease. 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 3. Enlarged partially empty sella, typically incidental but can be seen in the setting of  idiopathic intracranial hypertension. Electronically Signed   By: Keith Rake M.D.   On: 01/11/2022 20:42    Anti-infectives: Anti-infectives (From admission, onward)    Start     Dose/Rate Route Frequency Ordered Stop   01/10/22 0600  cefTRIAXone (ROCEPHIN) 2 g in sodium chloride 0.9 % 100 mL IVPB        2 g 200 mL/hr over 30 Minutes Intravenous On call to O.R. 01/10/22 0511 01/10/22 0814       Assessment/Plan  s/p  Procedure(s): LAPAROSCOPIC CHOLECYSTECTOMY WITH INTRAOPERATIVE CHOLANGIOGRAM 01/10/2022   Will try to work with bedside commode, may have to do purewick due to difficulty controlling urine. Hopefully this will improve with abx.  Encourage purposeful eating, monitor for dysphagia.  FEN - dysphagia diet VTE - lovenox ID - start ceftriaxone for UTI Disposition - inpatient   LOS: 1 day   I reviewed last 24 h vitals and pain scores, last 48 h intake and output, last 24 h labs and trends, and last 24 h imaging results.  This care required high  level of medical decision making.   [facility] 01/12/2022, 9:53 AM Please see Amion for pager number during day hours 7:00am-4:30pm or 7:00am -11:30am on weekends

## 2022-01-13 LAB — CBC WITH DIFFERENTIAL/PLATELET
Abs Immature Granulocytes: 0.04 10*3/uL (ref 0.00–0.07)
Basophils Absolute: 0 10*3/uL (ref 0.0–0.1)
Basophils Relative: 0 %
Eosinophils Absolute: 0.1 10*3/uL (ref 0.0–0.5)
Eosinophils Relative: 1 %
HCT: 29.3 % — ABNORMAL LOW (ref 36.0–46.0)
Hemoglobin: 9.9 g/dL — ABNORMAL LOW (ref 12.0–15.0)
Immature Granulocytes: 0 %
Lymphocytes Relative: 21 %
Lymphs Abs: 1.9 10*3/uL (ref 0.7–4.0)
MCH: 34.4 pg — ABNORMAL HIGH (ref 26.0–34.0)
MCHC: 33.8 g/dL (ref 30.0–36.0)
MCV: 101.7 fL — ABNORMAL HIGH (ref 80.0–100.0)
Monocytes Absolute: 1 10*3/uL (ref 0.1–1.0)
Monocytes Relative: 12 %
Neutro Abs: 5.9 10*3/uL (ref 1.7–7.7)
Neutrophils Relative %: 66 %
Platelets: 286 10*3/uL (ref 150–400)
RBC: 2.88 MIL/uL — ABNORMAL LOW (ref 3.87–5.11)
RDW: 14.5 % (ref 11.5–15.5)
WBC: 9 10*3/uL (ref 4.0–10.5)
nRBC: 0 % (ref 0.0–0.2)

## 2022-01-13 LAB — COMPREHENSIVE METABOLIC PANEL
ALT: 35 U/L (ref 0–44)
AST: 66 U/L — ABNORMAL HIGH (ref 15–41)
Albumin: 2.8 g/dL — ABNORMAL LOW (ref 3.5–5.0)
Alkaline Phosphatase: 20 U/L — ABNORMAL LOW (ref 38–126)
Anion gap: 5 (ref 5–15)
BUN: 20 mg/dL (ref 8–23)
CO2: 25 mmol/L (ref 22–32)
Calcium: 8.6 mg/dL — ABNORMAL LOW (ref 8.9–10.3)
Chloride: 108 mmol/L (ref 98–111)
Creatinine, Ser: 1.08 mg/dL — ABNORMAL HIGH (ref 0.44–1.00)
GFR, Estimated: 54 mL/min — ABNORMAL LOW (ref >60)
Glucose, Bld: 67 mg/dL — ABNORMAL LOW (ref 70–99)
Potassium: 4 mmol/L (ref 3.5–5.1)
Sodium: 138 mmol/L (ref 135–145)
Total Bilirubin: 0.8 mg/dL (ref 0.3–1.2)
Total Protein: 5.3 g/dL — ABNORMAL LOW (ref 6.5–8.1)

## 2022-01-13 LAB — GLUCOSE, CAPILLARY
Glucose-Capillary: 73 mg/dL (ref 70–99)
Glucose-Capillary: 111 mg/dL — ABNORMAL HIGH (ref 70–99)
Glucose-Capillary: 101 mg/dL — ABNORMAL HIGH (ref 70–99)

## 2022-01-13 MED ORDER — DEXTROSE 5 % IV SOLN: INTRAVENOUS | Status: DC

## 2022-01-13 MED ORDER — MENTHOL 3 MG MT LOZG
1.0000 | LOZENGE | OROMUCOSAL | Status: DC | PRN
  Filled 2022-01-13: qty 9

## 2022-01-13 MED ORDER — OXYBUTYNIN CHLORIDE 5 MG PO TABS
2.5000 mg | ORAL_TABLET | Freq: Two times a day (BID) | ORAL | Status: DC
  Administered 2022-01-13 – 2022-01-15 (×4): 2.5 mg via ORAL
  Filled 2022-01-13 (×4): qty 1

## 2022-01-13 MED ORDER — CARVEDILOL 3.125 MG PO TABS
3.1250 mg | ORAL_TABLET | Freq: Two times a day (BID) | ORAL | Status: DC
  Administered 2022-01-13 – 2022-01-15 (×4): 3.125 mg via ORAL
  Filled 2022-01-13 (×4): qty 1

## 2022-01-13 MED ORDER — DILTIAZEM HCL ER COATED BEADS 180 MG PO CP24
180.0000 mg | ORAL_CAPSULE | Freq: Every day | ORAL | Status: DC
  Administered 2022-01-13 – 2022-01-15 (×3): 180 mg via ORAL
  Filled 2022-01-13 (×3): qty 1

## 2022-01-13 NOTE — Progress Notes (Signed)
We will follow along in a.m. to ensure hypoglycemia resolves, her blood pressure remains reasonably controlled -she needs skilled placement, disposition as per general surgery- if she does not remain orthostatic and can tolerate the TED hose and her hypoglycemia is resolved, I see no impediment to her discharging tomorrow.  Verneita Griffes, MD [facility] 5:47 PM

## 2022-01-13 NOTE — Progress Notes (Signed)
3 Days Post-Op   Chief Complaint/Subjective: Patient concerned about blood pressure swings and swallowing issues. Better sleep with purewick. No abdominal pain  Objective: Vital signs in last 24 hours: Temp:  [98.3 F (36.8 C)-98.9 F (37.2 C)] 98.3 F (36.8 C) (02/19 0826) Pulse Rate:  [50-88] 88 (02/19 0826) Resp:  [16-18] 18 (02/19 0826) BP: (89-175)/(49-77) 89/49 (02/19 0826) SpO2:  [100 %] 100 % (02/19 0826) Last BM Date : 01/12/22 Intake/Output from previous day: 02/18 0701 - 02/19 0700 In: 800 [P.O.:800] Out: 2400 [Urine:2400] Intake/Output this shift: No intake/output data recorded.  PE: Gen: NAd Resp: nonlabored Card: RRR Abd: soft, NT, incisions intact  Lab Results:  Recent Labs    01/12/22 0655 01/13/22 0418  WBC 12.3* 9.0  HGB 10.0* 9.9*  HCT 29.6* 29.3*  PLT 307 286   BMET Recent Labs    01/12/22 0804 01/13/22 0418  NA 139 138  K 3.6 4.0  CL 110 108  CO2 23 25  GLUCOSE 80 67*  BUN 21 20  CREATININE 1.25* 1.08*  CALCIUM 8.5* 8.6*   PT/INR No results for input(s): LABPROT, INR in the last 72 hours. CMP     Component Value Date/Time   NA 138 01/13/2022 0418   NA 142 10/08/2018 0929   K 4.0 01/13/2022 0418   CL 108 01/13/2022 0418   CO2 25 01/13/2022 0418   GLUCOSE 67 (L) 01/13/2022 0418   BUN 20 01/13/2022 0418   BUN 28 (H) 10/08/2018 0929   CREATININE 1.08 (H) 01/13/2022 0418   CALCIUM 8.6 (L) 01/13/2022 0418   PROT 5.3 (L) 01/13/2022 0418   PROT 5.9 (L) 10/08/2018 0929   ALBUMIN 2.8 (L) 01/13/2022 0418   ALBUMIN 4.0 10/08/2018 0929   AST 66 (H) 01/13/2022 0418   ALT 35 01/13/2022 0418   ALKPHOS 20 (L) 01/13/2022 0418   BILITOT 0.8 01/13/2022 0418   BILITOT 0.4 10/08/2018 0929   GFRNONAA 54 (L) 01/13/2022 0418   GFRAA 48 (L) 10/08/2018 0929   Lipase  No results found for: LIPASE  Studies/Results: CT HEAD WO CONTRAST (5MM)  Result Date: 01/11/2022 CLINICAL DATA:  Altered mental status.  Cholecystectomy yesterday. EXAM:  CT HEAD WITHOUT CONTRAST TECHNIQUE: Contiguous axial images were obtained from the base of the skull through the vertex without intravenous contrast. RADIATION DOSE REDUCTION: This exam was performed according to the departmental dose-optimization program which includes automated exposure control, adjustment of the mA and/or kV according to patient size and/or use of iterative reconstruction technique. COMPARISON:  None. FINDINGS: Brain: Scratch no intracranial hemorrhage, mass effect, or midline shift. No hydrocephalus. The basilar cisterns are patent. Brain volume is normal for age. There is mild periventricular and deep white matter hypodensity. 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 No evidence of territorial infarct or acute ischemia. Enlarged partially empty sella. No extra-axial or intracranial fluid collection. Vascular: Atherosclerosis of skullbase vasculature without hyperdense vessel or abnormal calcification. Skull: No fracture or focal lesion.  Hyperostoses frontalis. Sinuses/Orbits: Paranasal sinuses and mastoid air cells are clear. The visualized orbits are unremarkable. Bilateral cataract resection. Other: None. IMPRESSION: 1. No acute intracranial abnormality. 2. Mild chronic small vessel ischemic disease. 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 3. Enlarged partially empty sella, typically incidental but can be seen in the setting of idiopathic intracranial hypertension. Electronically Signed   By: Keith Rake M.D.   On: 01/11/2022 20:42    Anti-infectives: Anti-infectives (From admission, onward)    Start  Dose/Rate Route Frequency Ordered Stop   01/12/22 1045  cefTRIAXone (ROCEPHIN) 2 g in sodium chloride 0.9 % 100 mL IVPB  Status:  Discontinued        2 g 200 mL/hr over 30 Minutes Intravenous Every 24 hours 01/12/22 0955 01/12/22 0956   01/10/22 0600  cefTRIAXone (ROCEPHIN) 2 g in sodium chloride 0.9 % 100 mL IVPB        2 g 200 mL/hr over 30 Minutes  Intravenous On call to O.R. 01/10/22 0511 01/10/22 0814       Assessment/Plan  s/p Procedure(s): LAPAROSCOPIC CHOLECYSTECTOMY WITH INTRAOPERATIVE CHOLANGIOGRAM 01/10/2022   Dysphagia - I witnessed successful swallowing of water and eggs without coughing  Hypotension - when it was low she had no symptoms of LH, discussed goals of maintaining appropriate blood pressure in the post op period given recent LH and neuro concerns with long term goals for appropriate BP control  Urge incontinence - chronic, thankful the hospitalist appropriately diagnosed this and will follow their recommendations  FEN - dys 3 diet VTE - eliquis ID - no issues Disposition - SNF soon   LOS: 2 days   I reviewed last 24 h vitals and pain scores, last 48 h intake and output, last 24 h labs and trends, and last 24 h imaging results.  This care required high  level of medical decision making.   [facility] 01/13/2022, 8:56 AM Please see Amion for pager number during day hours 7:00am-4:30pm or 7:00am -11:30am on weekends

## 2022-01-13 NOTE — Progress Notes (Signed)
Consultation Progress Note   Patient: Tricia Harrison EXH:[id number] DOB: 05/22/1946 DOA: 01/10/2022 DOS: the patient was seen and examined on 01/13/2022 Primary service: Michael Boston, MD  Brief hospital course: 76 year old black female DM TY 2, HTN, HL D, reflux, hypothyroid cardiac cath 2019 pulmonary hypertension nonobstructive CAD Left lower extremity DVT 07/2018 admission CKD 3B, HLD, HTN  Admit to 2.16.23 symptomatic biliary colic-status post lap chole 01/10/2022-felt lightheaded weak and patient was orthostatic  Postoperatively noted slight rise in her BUN/creatinine above baseline of 1.2-21/1.4 WBC is trended down postoperatively to 12.3, hemoglobin is 10.0 CT head 2/17 showed partial empty sella UA trace leukocytes negative nitrite--no urine culture was obtained?  Assessment and Plan:  Lap chole 2/16 Dr. Johney Maine Per attending service pain control etc. Unlikely UTI Received 1 dose Rocephin--no urine culture collected--no dysuria no frequency and has accidents at home Add ditropan 2.5 twice daily very low dose [beers med]-- She will need strategies with regards to voiding safely at home and set up at home after she has skilled nursing stay-I have explained to her the challenges of getting up quickly My suggestion is that she get up very slowly and do not drink fluids late in the evening to prevent nighttime accidents/nocturia Hypoglycemia today Pretty low blood sugars this morning Check 4 times daily ACHS--stop D5 and feed Orthostasis confirmed CT head rules out stroke Has been on blood pressure meds since she had preeclampsia at age 74 Current meds = Coreg 3/125 twice daily Cardizem down titrated from 360---180 Recheck OVS q shift Long discussion patient's son and daughter-in-law as well as patient regarding postural orthostasis in addition to supine hypertension and difficulty treating it Place TED hose, outpatient education, I will CC Dr. Moshe Cipro who has been one of  the physicians managing her blood pressure to ensure that she is aware of some of the issues surrounding blood pressure control as patient seems quite worried about pressures in the 150s 2019 pulmonary hypertension based off of cath Med control weight loss etc. DVT 9/19 admission Cautious continuation of Eliquis-May be time to discuss discontinuation of this and only aspirin as an outpatient with her PCP?  This is about 2 years ago Retinopathy Continue Imuran-needs blood pressure control to delay progression, balancing with risk of falls      TR[facility] will continue to follow the patient.  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-patient seems to understand and teach back was displayed with family members who also were listening in As she is somewhat hoarse today which started after her surgery I feel that this is because of her intubation, I have given her Cepacol lozenges and I think that this will improve  Physical Exam: Vitals:   01/12/22 2040 01/13/22 0652 01/13/22 0826 01/13/22 1348  BP: 137/68 (!) 175/77 (!) 89/49 (!) 158/81  Pulse: 73 63 88 (!) 101  Resp: 16 16 18 18   Temp:  98.8 F (37.1 C) 98.3 F (36.8 C) 98.8 F (37.1 C)  TempSrc:  Oral Oral Oral  SpO2: 100% 100% 100% 100%  Weight:      Height:       EOMI NCAT no focal deficit thick neck Mallampati 4 slightly stooped and kyphotic ROM intact no focal deficit CTA B no added sound rales rhonchi Sinus rhythm on exam Neurologically intact moving 4 limbs equally although slow-has some lower extremity swelling  Data Reviewed:  leukocytosis improved , BUN/creatinine improving LFTs trending down Hypoglycemic earlier today  Time spent: 65 minutes minutes.  Author: Nita Sells, MD 01/13/2022 5:40 PM  For on call review [email or web address].

## 2022-01-14 ENCOUNTER — Inpatient Hospital Stay (HOSPITAL_COMMUNITY): Payer: Medicare HMO

## 2022-01-14 DIAGNOSIS — Z993 Dependence on wheelchair: Secondary | ICD-10-CM

## 2022-01-14 DIAGNOSIS — K1379 Other lesions of oral mucosa: Secondary | ICD-10-CM

## 2022-01-14 LAB — GLUCOSE, CAPILLARY
Glucose-Capillary: 86 mg/dL (ref 70–99)
Glucose-Capillary: 89 mg/dL (ref 70–99)
Glucose-Capillary: 90 mg/dL (ref 70–99)
Glucose-Capillary: 94 mg/dL (ref 70–99)

## 2022-01-14 LAB — CBC WITH DIFFERENTIAL/PLATELET
Abs Immature Granulocytes: 0.02 10*3/uL (ref 0.00–0.07)
Basophils Absolute: 0 10*3/uL (ref 0.0–0.1)
Basophils Relative: 0 %
Eosinophils Absolute: 0.2 10*3/uL (ref 0.0–0.5)
Eosinophils Relative: 2 %
HCT: 29.1 % — ABNORMAL LOW (ref 36.0–46.0)
Hemoglobin: 10 g/dL — ABNORMAL LOW (ref 12.0–15.0)
Immature Granulocytes: 0 %
Lymphocytes Relative: 23 %
Lymphs Abs: 1.7 10*3/uL (ref 0.7–4.0)
MCH: 34.1 pg — ABNORMAL HIGH (ref 26.0–34.0)
MCHC: 34.4 g/dL (ref 30.0–36.0)
MCV: 99.3 fL (ref 80.0–100.0)
Monocytes Absolute: 1 10*3/uL (ref 0.1–1.0)
Monocytes Relative: 13 %
Neutro Abs: 4.6 10*3/uL (ref 1.7–7.7)
Neutrophils Relative %: 62 %
Platelets: 314 10*3/uL (ref 150–400)
RBC: 2.93 MIL/uL — ABNORMAL LOW (ref 3.87–5.11)
RDW: 14.3 % (ref 11.5–15.5)
WBC: 7.4 10*3/uL (ref 4.0–10.5)
nRBC: 0 % (ref 0.0–0.2)

## 2022-01-14 LAB — RENAL FUNCTION PANEL
Albumin: 2.5 g/dL — ABNORMAL LOW (ref 3.5–5.0)
Anion gap: 5 (ref 5–15)
BUN: 19 mg/dL (ref 8–23)
CO2: 27 mmol/L (ref 22–32)
Calcium: 8.6 mg/dL — ABNORMAL LOW (ref 8.9–10.3)
Chloride: 105 mmol/L (ref 98–111)
Creatinine, Ser: 1.31 mg/dL — ABNORMAL HIGH (ref 0.44–1.00)
GFR, Estimated: 42 mL/min — ABNORMAL LOW (ref >60)
Glucose, Bld: 92 mg/dL (ref 70–99)
Phosphorus: 3.1 mg/dL (ref 2.5–4.6)
Potassium: 4 mmol/L (ref 3.5–5.1)
Sodium: 137 mmol/L (ref 135–145)

## 2022-01-14 MED ORDER — CARVEDILOL 3.125 MG PO TABS: 3.1250 mg | ORAL_TABLET | Freq: Two times a day (BID) | ORAL | 5 refills | Status: DC

## 2022-01-14 MED ORDER — CLONIDINE HCL 0.1 MG PO TABS: 0.1000 mg | ORAL_TABLET | Freq: Two times a day (BID) | ORAL | 0 refills | Status: DC | PRN

## 2022-01-14 MED ORDER — DILTIAZEM HCL ER COATED BEADS 180 MG PO CP24: 180.0000 mg | ORAL_CAPSULE | Freq: Every day | ORAL | 5 refills | Status: DC

## 2022-01-14 MED ORDER — RAMIPRIL 2.5 MG PO CAPS: 5.0000 mg | ORAL_CAPSULE | Freq: Every day | ORAL | 5 refills | Status: DC

## 2022-01-14 NOTE — Care Management Important Message (Signed)
Important Message  Patient Details IM Letter given to the Patient Name: Tricia Harrison MRN: [id number] Date of Birth: 1946-11-11   Medicare Important Message Given:  Yes     Kerin Salen 01/14/2022, 3:01 PM

## 2022-01-14 NOTE — Progress Notes (Signed)
Speech Language Pathology Treatment: Dysphagia  Patient Details Name: Tricia Harrison MRN: [id number] DOB: Feb 27, 1946 Today's Date: 01/14/2022 Time: 0100-7121 SLP Time Calculation (min) (ACUTE ONLY): 15 min  Assessment / Plan / Recommendation Clinical Impression  Patient seen by SLP for skilled treatment with focus on dysphagia. Patient reported that her swallowing has been improving; on 2/18 she stated she had some liquids moving up into her nose, on 2/19 she had globus sensation in throat and "threw up trying to get it up" but this morning she had "very little trouble with a little bit of eggs" and no trouble with cream of wheat. SLP observed patient's voice to be mildy hypernasal which was observed on 2/17 by evaluating SLP. No overt s/s aspiration or penetration when patient swallowing a few small sips of water. SLP to proceed with MBS to r/o dysphagia.   HPI HPI: Patient is a 76 year old woman s/p lap chole on 01/10/22. Swallow eval was ordered due to pt report of dysphagia to solids post-op. 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  Recommendations may be updated based on patient status, additional functional criteria and insurance authorization.    Recommendations  Diet recommendations: Dysphagia 3 (mechanical soft);Thin liquid Liquids provided via: Cup;Straw Medication Administration: Crushed with puree Supervision: Patient able to self feed Compensations: Minimize environmental distractions;Slow rate;Small sips/bites;Follow solids with liquid Postural Changes and/or Swallow Maneuvers: Upright 30-60 min after meal;Seated upright 90 degrees                Oral Care Recommendations: Oral care  BID Follow Up Recommendations: Skilled nursing-short term rehab (<3 hours/day) Assistance recommended at discharge: Intermittent Supervision/Assistance SLP Visit Diagnosis: Dysphagia, unspecified (R13.10) Plan: Continue with current plan of care;MBS          Sonia Baller, MA, CCC-SLP Speech Therapy

## 2022-01-14 NOTE — Progress Notes (Signed)
Modified Barium Swallow Progress Note  Patient Details  Name: Tricia Harrison MRN: [id number] Date of Birth: Jul 25, 1946  Today's Date: 01/14/2022  Modified Barium Swallow completed.  Full report located under Chart Review in the Imaging Section.  Brief recommendations include the following:  Clinical Impression  Patient presents with a primary pharyngeal phase dysphagia as per this MBS. No penetration or aspiration events were observed with tested consistencies of: thin liquid, puree solid, regular solid, barium tablet. During first sip of thin liquid barium, trace amount of barium started transiting into the nasopharynx but exited without intervention. With thin liquid barium, patient had mild-moderate amount of pharyngeal residuals in vallecular and pyriform sinuses but this cleared to trace pyriform and trace to min vallecular sinus residuals after subsequent dry swallows performed independently by patient (7 in total). With regular solids, patient exhibited delayed swallow initiation to level of vallecular sinus and during swallow, some of the barium transited from vallecular sinus back into oral cavity. Patient was able to clear oral and pharyngeal barium residuals when given plain water to drink. When taking barium tablet with plain water, patient did exhibit difficulty of bolus cohesion at oral phase but pharyngeally, tablet transited without difficulty. Barium residuals did appear to become pooled above the UES, suggesting decreased UES opening but with eventual full transit of barium. No retrograde movement of any barium was observed during this study.SLP is recommending to continue with Dys 3 solids, thin liquids diet and for patient to continue compensating for dysphagia by drinking sips of liquids (preferably water) after bites of solid foods and to complete dry swallows as needed after bites and sips to fully clear pharynx. SLP is also recommending f/u outpatient SLP services for  dysphagia management. If patient's symptoms do not improve, may need GI consult.   Swallow Evaluation Recommendations   Recommended Consults: Consider GI evaluation;Consider esophageal assessment   SLP Diet Recommendations: Dysphagia 3 (Mech soft) solids;Thin liquid   Liquid Administration via: Cup;Straw   Medication Administration: Whole meds with liquid   Supervision: Patient able to self feed   Compensations: Minimize environmental distractions;Slow rate;Small sips/bites;Follow solids with liquid   Postural Changes: Seated upright at 90 degrees;Remain semi-upright after after feeds/meals (Comment) (30 minutes after meals)   Oral Care Recommendations: Oral care BID      Sonia Baller, MA, CCC-SLP Speech Therapy

## 2022-01-14 NOTE — Progress Notes (Signed)
T[place] [id number] 1946-02-02  CARE TEAM:  PCP: Lujean Amel, MD  Outpatient Care Team: Patient Care Team: Lujean Amel, MD as PCP - General (Family Medicine) Leonie Man, MD as PCP - Cardiology (Cardiology) Brand Males, MD as PCP - Pulmonology (Pulmonary Disease) Clarene Essex, MD as Consulting Physician (Gastroenterology) Erline Levine, MD as Consulting Physician (Neurosurgery) Michael Boston, MD as Consulting Physician (General Surgery) Corliss Parish, MD as Consulting Physician (Nephrology)  Inpatient Treatment Team: Treatment Team: Attending Provider: Michael Boston, MD; Consulting Physician: Nolon Nations, MD; Consulting Physician: Fatima Blank, MD; Consulting Physician: Nita Sells, MD; Student Nurse: Roger Shelter, Student-RN; Registered Nurse: Kandee Keen, RN; Charge Nurse: Aura Dials, RN; Technician: Karna Christmas, NT; Pharmacist: Dimple Nanas, [facility]; Occupational Therapist: Lavon Paganini, OT; Speech Language Pathologist: Sonia Baller, CCC-SLP; Utilization Review: Micah Noel, RN   Problem List:   Principal Problem:   Chronic cholecystitis Active Problems:   Essential hypertension   Hypothyroidism   Hyperlipidemia LDL goal <100   Fatigue   GERD (gastroesophageal reflux disease)   Diabetes mellitus without complication ([place])   Obstructive sleep apnea   Uses roller walker   Chronic kidney disease, stage 3b ([place])   Orthostasis   Leukocytosis   Uses wheelchair   4 Days Post-Op  01/10/2022  POST-OPERATIVE DIAGNOSIS:    Chronic [place] cholecystitis Fatty steatohepatitis   PROCEDURE:   Laparoscopic lysis of adhesions x35 minutes (half of case) Laparoscopic cholecystectomy with intraoperative cholangiogram (CPT code 67124) Wedge liver biopsy   SURGEON:  Adin Hector, MD, FACS.   OR FINDINGS:  Moderate adhesions to the gallbladder with some wall thickening consistent with chronic cholecystitis.   Liver with some mild fatty change.  Rather hepatic gallbladder with tongue of liver on and so therefore en bloc wedge liver biopsy done.  Cholangiogram showing rather classic biliary anatomy with no choledocholithiasis leak or stricture.   Moderate periumbilical and infraumbilical omental adhesions limiting capacity to do single site cholecystectomy.  Therefore standard four-port done.  Inspection confirmed adhesions to the anterior abdominal wall were all omental.   Liver: Fatty steatohepatitis   Assessment  Gradually recovering from cholecystectomy.  Desired by family and patient to transition to rehab/skilled facility first - therapies agree.  [facility] Stay = 3 days)  Plan:  Dysphagia with food sticking at first .  Speech therapy to evaluation - signs of velopharyngeal dysfunction eImproved w oropharynx hydration/lozenges - stable on Dys3 diet  -Physical & Occupational Therapy evaluations.  Agree that patient would benefit from rehab at a skilled facility.  [facility] hospitalist medicine consultation to make sure there are no other concerns for etiology of her weakness and lightheadedness - help appreciated.  History of hypertension - BP more elevated.  Sounds like her nephrologist, Dr. Moshe Cipro, has recommended ACE inhibitor ramipril - adjusting to be safe.  GERD.  PPI  Hypothyroidism.  Levothyroxine.  Sleep apnea.  Supplemental oxygen as needed.  -VTE prophylaxis- SCDs, etc.  Lovenox.  Hgb stable on eliquis POD#1.  Agree w TRH that maybe Aspirin only may be more appropriate at this time?  -mobilize as tolerated to help recovery.  Limited mobility with at least 1 if not 2 person assist to ambulate - walking better.  PT/OT recs  Discussion made with the patient at length.  Also discussed with Dr. Verlon Au with the medicine hospital service he has been helping guide medicine adjustments.  He offered to look for final recommendations since he has been trying to  reach out with  primary care and nephrology to make sure that we have a medication plan that is safer for her.  Disposition:  Disposition:  The patient is from: Home  Anticipate discharge to:  [facility] (SNF)  Anticipated Date of Discharge is:  February 20,2023   Barriers to discharge:  Pending Clinical improvement (more likely than not)  Patient currently is NOT MEDICALLY STABLE for discharge from the hospital from a surgery standpoint.      45 minutes spent in review, evaluation, examination, counseling, and coordination of care.   I have reviewed this patient's available data, including medical history, events of note, physical examination and test results as part of my evaluation.  A significant portion of that time was spent in counseling.  Care during the described time interval was provided by me.  01/14/2022    Subjective: (Chief complaint)  Patient feeling much better overall.  The food sticking dysphagia seems much less now with the help of lozenges and dysphagia 3 diet.  No coughing or difficulty.  Appetite better.  Happily surprised she has not had much pain or nausea with the gallbladder surgery.  Glad that is over with.  Had some low blood sugar events but things have been adjusted.  Notes her blood pressure is getting a little higher today.  Hoping to try rehab at a skilled facility in the short-term.  Objective:  Vital signs:  Vitals:   01/13/22 0826 01/13/22 1348 01/13/22 2143 01/14/22 0552  BP: (!) 89/49 (!) 158/81 (!) 159/97 (!) 159/80  Pulse: 88 (!) 101 65 61  Resp: 18 18 18 18   Temp: 98.3 F (36.8 C) 98.8 F (37.1 C) 98.5 F (36.9 C) 98.5 F (36.9 C)  TempSrc: Oral Oral Oral Oral  SpO2: 100% 100% 99% 98%  Weight:      Height:        Last BM Date : 01/13/22  Intake/Output   Yesterday:  02/19 0701 - 02/20 0700 In: 580.7 [P.O.:580; I.V.:0.7] Out: 2100 [Urine:2100] This shift:  No intake/output data recorded.  Bowel  function:  Flatus: YES  BM:  No  Drain: (No drain)   Physical Exam:  General: Pt awake/alert in no acute distress.  Bright and alert sitting up in bed.  Chatty. Eyes: PERRL, normal EOM.  Sclera clear.  No icterus Neuro: CN II-XII intact w/o focal sensory/motor deficits. Lymph: No head/neck/groin lymphadenopathy Psych:  No delerium/psychosis/paranoia.  Oriented x 4 HENT: Normocephalic, Mucus membranes moist.  No thrush Neck: Supple, No tracheal deviation.  No obvious thyromegaly Chest: No pain to chest wall compression.  Good respiratory excursion.  No audible wheezing CV:  Pulses intact.  Regular rhythm.  No major extremity edema MS: Normal AROM mjr joints.  No obvious deformity  Abdomen: Soft.  Nondistended.  Nontender.  No evidence of peritonitis.  I removed all the dressings.  Incisions clean dry and intact no incarcerated hernias.  Ext:   No deformity.  No mjr edema.  No cyanosis Skin: No petechiae / purpurea.  No major sores.  Warm and dry    Results:   Cultures: Recent Results (from the past 720 hour(s))  SARS CORONAVIRUS 2 (TAT 6-24 HRS) Nasopharyngeal Nasopharyngeal Swab     Status: None   Collection Time: 01/08/22  6:55 AM   Specimen: Nasopharyngeal Swab  Result Value Ref Range Status   SARS Coronavirus 2 NEGATIVE NEGATIVE Final    Comment: (NOTE) SARS-CoV-2 target nucleic acids are NOT DETECTED.  The SARS-CoV-2 RNA  is generally detectable in upper and lower respiratory specimens during the acute phase of infection. Negative results do not preclude SARS-CoV-2 infection, do not rule out co-infections with other pathogens, and should not be used as the sole basis for treatment or other patient management decisions. Negative results must be combined with clinical observations, patient history, and epidemiological information. The expected result is Negative.  Fact Sheet for Patients: SugarRoll.be  Fact Sheet for Healthcare  Providers: https://www.woods-mathews.com/  This test is not yet approved or cleared by the [place] FDA and  has been authorized for detection and/or diagnosis of SARS-CoV-2 by FDA under an Emergency Use Authorization (EUA). This EUA will remain  in effect (meaning this test can be used) for the duration of the COVID-19 declaration under Se ction 564(b)(1) of the Act, 21 U.S.C. section 360bbb-3(b)(1), unless the authorization is terminated or revoked sooner.  Performed at [facility], [place] [address], [place], [place] [address]     Labs: Results for orders placed or performed during the hospital encounter of 01/10/22 (from the past 48 hour(s))  Comprehensive metabolic panel     Status: Abnormal   Collection Time: 01/13/22  4:18 AM  Result Value Ref Range   Sodium 138 135 - 145 mmol/L   Potassium 4.0 3.5 - 5.1 mmol/L   Chloride 108 98 - 111 mmol/L   CO2 25 22 - 32 mmol/L   Glucose, Bld 67 (L) 70 - 99 mg/dL    Comment: Glucose reference range applies only to samples taken after fasting for at least 8 hours.   BUN 20 8 - 23 mg/dL   Creatinine, Ser 1.08 (H) 0.44 - 1.00 mg/dL   Calcium 8.6 (L) 8.9 - 10.3 mg/dL   Total Protein 5.3 (L) 6.5 - 8.1 g/dL   Albumin 2.8 (L) 3.5 - 5.0 g/dL   AST 66 (H) 15 - 41 U/L   ALT 35 0 - 44 U/L   Alkaline Phosphatase 20 (L) 38 - 126 U/L   Total Bilirubin 0.8 0.3 - 1.2 mg/dL   GFR, Estimated 54 (L) >60 mL/min    Comment: (NOTE) Calculated using the CKD-EPI Creatinine Equation (2021)    Anion gap 5 5 - 15    Comment: Performed at [facility], [place] [address], [place], [place] [address]  CBC with Differential/Platelet     Status: Abnormal   Collection Time: 01/13/22  4:18 AM  Result Value Ref Range   WBC 9.0 4.0 - 10.5 K/uL   RBC 2.88 (L) 3.87 - 5.11 MIL/uL   Hemoglobin 9.9 (L) 12.0 - 15.0 g/dL   HCT 29.3 (L) 36.0 - 46.0 %   MCV 101.7 (H) 80.0 - 100.0 fL   MCH 34.4 (H) 26.0 - 34.0 pg   MCHC 33.8 30.0 -  36.0 g/dL   RDW 14.5 11.5 - 15.5 %   Platelets 286 150 - 400 K/uL   nRBC 0.0 0.0 - 0.2 %   Neutrophils Relative % 66 %   Neutro Abs 5.9 1.7 - 7.7 K/uL   Lymphocytes Relative 21 %   Lymphs Abs 1.9 0.7 - 4.0 K/uL   Monocytes Relative 12 %   Monocytes Absolute 1.0 0.1 - 1.0 K/uL   Eosinophils Relative 1 %   Eosinophils Absolute 0.1 0.0 - 0.5 K/uL   Basophils Relative 0 %   Basophils Absolute 0.0 0.0 - 0.1 K/uL   Immature Granulocytes 0 %   Abs Immature Granulocytes 0.04 0.00 - 0.07 K/uL  Comment: Performed at [facility], [place] [address] [place], [place] [address]  Glucose, capillary     Status: None   Collection Time: 01/13/22  7:58 AM  Result Value Ref Range   Glucose-Capillary 73 70 - 99 mg/dL    Comment: Glucose reference range applies only to samples taken after fasting for at least 8 hours.  Glucose, capillary     Status: Abnormal   Collection Time: 01/13/22  3:36 PM  Result Value Ref Range   Glucose-Capillary 111 (H) 70 - 99 mg/dL    Comment: Glucose reference range applies only to samples taken after fasting for at least 8 hours.  Glucose, capillary     Status: Abnormal   Collection Time: 01/13/22 11:22 PM  Result Value Ref Range   Glucose-Capillary 101 (H) 70 - 99 mg/dL    Comment: Glucose reference range applies only to samples taken after fasting for at least 8 hours.  Renal function panel     Status: Abnormal   Collection Time: 01/14/22  4:09 AM  Result Value Ref Range   Sodium 137 135 - 145 mmol/L   Potassium 4.0 3.5 - 5.1 mmol/L   Chloride 105 98 - 111 mmol/L   CO2 27 22 - 32 mmol/L   Glucose, Bld 92 70 - 99 mg/dL    Comment: Glucose reference range applies only to samples taken after fasting for at least 8 hours.   BUN 19 8 - 23 mg/dL   Creatinine, Ser 1.31 (H) 0.44 - 1.00 mg/dL   Calcium 8.6 (L) 8.9 - 10.3 mg/dL   Phosphorus 3.1 2.5 - 4.6 mg/dL   Albumin 2.5 (L) 3.5 - 5.0 g/dL   GFR, Estimated 42 (L) >60 mL/min    Comment:  (NOTE) Calculated using the CKD-EPI Creatinine Equation (2021)    Anion gap 5 5 - 15    Comment: Performed at [facility], [place] [address] [place], [place] [address]  CBC with Differential/Platelet     Status: Abnormal   Collection Time: 01/14/22  4:09 AM  Result Value Ref Range   WBC 7.4 4.0 - 10.5 K/uL   RBC 2.93 (L) 3.87 - 5.11 MIL/uL   Hemoglobin 10.0 (L) 12.0 - 15.0 g/dL   HCT 29.1 (L) 36.0 - 46.0 %   MCV 99.3 80.0 - 100.0 fL   MCH 34.1 (H) 26.0 - 34.0 pg   MCHC 34.4 30.0 - 36.0 g/dL   RDW 14.3 11.5 - 15.5 %   Platelets 314 150 - 400 K/uL   nRBC 0.0 0.0 - 0.2 %   Neutrophils Relative % 62 %   Neutro Abs 4.6 1.7 - 7.7 K/uL   Lymphocytes Relative 23 %   Lymphs Abs 1.7 0.7 - 4.0 K/uL   Monocytes Relative 13 %   Monocytes Absolute 1.0 0.1 - 1.0 K/uL   Eosinophils Relative 2 %   Eosinophils Absolute 0.2 0.0 - 0.5 K/uL   Basophils Relative 0 %   Basophils Absolute 0.0 0.0 - 0.1 K/uL   Immature Granulocytes 0 %   Abs Immature Granulocytes 0.02 0.00 - 0.07 K/uL    Comment: Performed at [facility], [place] [address], [place], [place] [address]  Glucose, capillary     Status: None   Collection Time: 01/14/22  7:20 AM  Result Value Ref Range   Glucose-Capillary 86 70 - 99 mg/dL    Comment: Glucose reference range applies only to samples taken after fasting for at least 8  hours.    Imaging / Studies: No results found.  Medications / Allergies: per chart  Antibiotics: Anti-infectives (From admission, onward)    Start     Dose/Rate Route Frequency Ordered Stop   01/12/22 1045  cefTRIAXone (ROCEPHIN) 2 g in sodium chloride 0.9 % 100 mL IVPB  Status:  Discontinued        2 g 200 mL/hr over 30 Minutes Intravenous Every 24 hours 01/12/22 0955 01/12/22 0956   01/10/22 0600  cefTRIAXone (ROCEPHIN) 2 g in sodium chloride 0.9 % 100 mL IVPB        2 g 200 mL/hr over 30 Minutes Intravenous On call to O.R. 01/10/22 9692 01/10/22 4932          Note: Portions of this report may have been transcribed using voice recognition software. Every effort was made to ensure accuracy; however, inadvertent computerized transcription errors may be present.   Any transcriptional errors that result from this process are unintentional.    Adin Hector, MD, FACS, MASCRS Esophageal, Gastrointestinal & Colorectal Surgery Robotic and Minimally Invasive Surgery  [facility], [place]  [place] [place],  [address] [phone] Fax [phone] Main  CONTACT INFORMATION:  Weekday (9AM-5PM): Call [facility] main office at [phone]  Weeknight (5PM-9AM) or Weekend/Holiday: Check [email or web address] (password " [email or web address]") for General Surgery [facility] coverage  (Please, do not use SecureChat as it is not reliable communication to operating surgeons for immediate patient care)      01/14/2022  8:14 AM

## 2022-01-14 NOTE — TOC Progression Note (Signed)
Transition of Care [facility]) - Progression Note    Patient Details  Name: Charika Mikelson MRN: [id number] Date of Birth: February 17, 1946  [facility]) CM/SW Contact  Joaquin Courts, RN Phone Number: 01/14/2022, 9:40 AM  Clinical Narrative:    CM spoke with patient and daughter in law [place].  Provided patient and [place] with bed offers and discussed need to made a choice as well as obtain insurance auth.  [place] top review bed offers, plan to follow-up at lunch time for final choice.    Expected Discharge Plan: [place] Barriers to Discharge: Continued Medical Work up, Ship broker, SNF Pending bed offer  Expected Discharge Plan and Services Expected Discharge Plan: [place] In-house Referral: Clinical Social Work   Post Acute Care Choice: [place] Living arrangements for the past 2 months: Single Family Home Expected Discharge Date: 01/11/22               DME Arranged: N/A DME Agency: NA                   Social Determinants of Health (SDOH) Interventions    Readmission Risk Interventions No flowsheet data found.

## 2022-01-14 NOTE — Progress Notes (Signed)
Consultation Progress Note   Patient: Tricia [place] PYP:[id number] DOB: 05/19/1946 DOA: 01/10/2022 DOS: the patient was seen and examined on 01/14/2022 Primary service: Michael Boston, MD  Brief hospital course: 76 year old black female DM TY 2, HTN, HL D, reflux, hypothyroid cardiac cath 2019 pulmonary hypertension nonobstructive CAD Left lower extremity DVT 07/2018 admission CKD 3B, HLD, HTN  Admit to 2.16.23 symptomatic biliary colic-status post lap chole 01/10/2022-felt lightheaded weak and patient was orthostatic  Postoperatively noted slight rise in her BUN/creatinine above baseline of 1.2-21/1.4 WBC is trended down postoperatively to 12.3, hemoglobin is 10.0 CT head 2/17 showed partial empty sella UA trace leukocytes negative nitrite--no urine culture was obtained?  Assessment and Plan:  Lap chole 2/16 Dr. Johney [place] Per attending service pain control etc. Unlikely UTI Received 1 dose Rocephin--no urine culture collected--no dysuria no frequency and has accidents at home Add ditropan 2.5 twice daily very low dose [beers med]-- have explained to her the challenges of getting up quickly Discussed arousing very slowly and do not drink fluids late in the evening to prevent nighttime accidents/nocturia Dysphagia Getting MBS today per SLP--has globus sensation [improved some with Cepachol] Hypoglycemia 2/2 poor po Resolved 2/20 Orthostasis confirmed CT head rules out stroke Has been on blood pressure meds since she had preeclampsia at age 21 Current meds = Coreg 3/125 twice daily Cardizem down titrated from 360---180 Recheck OVS q shift Place clinical compression grade hose, outpatient education Dr. Moshe Cipro her kidney doc] aware--will coordinate OP med adjustment from now on For SITTING BP >180, will add Clonidine bid prn 2019 pulmonary hypertension based off of cath Med control weight loss etc. DVT 9/19 admission Cautious continuation of Eliquis-May be time to discuss  discontinuation of this and only aspirin as an outpatient with her PCP?  This is about 2 years ago Retinopathy Continue Imuran-needs blood pressure control to delay progression, balancing with risk of falls      TR[facility] will continue to follow the patient. Full d/w family members present today who understand POC and were given ample discussion opportunities  Subjective:   Eating better-no low cbg No cp Still worries about BP No cp + flatus, no vomit No fever  Physical Exam: Vitals:   01/13/22 1348 01/13/22 2143 01/14/22 0552 01/14/22 0824  BP: (!) 158/81 (!) 159/97 (!) 159/80 122/74  Pulse: (!) 101 65 61 76  Resp: 18 18 18 18   Temp: 98.8 F (37.1 C) 98.5 F (36.9 C) 98.5 F (36.9 C) 98.8 F (37.1 C)  TempSrc: Oral Oral Oral Oral  SpO2: 100% 99% 98% 99%  Weight:      Height:       EOMI NCAT  thick neck Mallampati 4 slightly stooped and kyphotic ROM intact no focal deficit CTA B no added sound rales rhonchi NSR Neurologically intact moving 4 limbs equally although slow-swelling better  Data Reviewed:  leukocytosis  gone , BUN/creatinine better   Hypoglycemia resolved  Time spent: 25 min  Author: Nita Sells, MD 01/14/2022 10:55 AM  For on call review [email or web address].

## 2022-01-14 NOTE — Progress Notes (Signed)
Physical Therapy Treatment Patient Details Name: Tricia Harrison MRN: [id number] DOB: Dec 19, 1945 Today's Date: 01/14/2022   History of Present Illness 76 year old black female DM TY 2, HTN, HL D, reflux, hypothyroid cardiac cath 2019 pulmonary hypertension nonobstructive CAD  Left lower extremity DVT 07/2018 admission  CKD 3B, HLD, HTN     Admit to 2.16.23 symptomatic biliary colic-status post lap chole 01/10/2022-felt lightheaded weak and patient was orthostatic     Postoperatively noted slight rise in her BUN/creatinine above baseline of 1.2-21/1.4  WBC is trended down postoperatively to 12.3, hemoglobin is 10.0  CT head 2/17 showed partial empty sella  UA trace leukocytes negative nitrite--no urine culture was obtained?    PT Comments    Pt sitting EOB with Student Nurse who was in middle of taking Orthostatic vitals.  Assisted pt to bathroom as her 3 min vital.  General transfer comment: Min A with mod cues for hand placement as pt immdediately fears of falling.  Also assisted with a toilet transfer.  Pt present with poor forward flex posture and required assist with peri care due to balance deficits.  Pt also c/o weakness/fatigue.  "I got to sit down". HIGH FALL RISK.  General Gait Details: Min Assist + 2 Scientist, forensic) assisted amb pt to and from bathroom only due to MAX c/o weakness/fatigue and fear of falling.  Pt present with limited activity tolerance.  Pt reports her mobility has greatly declined past year.  "I just can't do what I use to".  "I have to stop ans sit alot".  "That's why I use the Rollator.  It has a seat".  Unable to attempt amb in hallway after bathroom trip due to MAX c/o fatigue and BP decreased to 95/48 with HR 67.  "My head feels heavy".General bed mobility comments: Max Assist to support B LE up onto bed and + 2 to scoot to [facility] as pt was unable/inable to self position.  "I'm crooked' Pt lives with sister who has been assisting her.  Pt will need ST Rehab at SNF prior to  safely retuning.    Recommendations for follow up therapy are one component of a multi-disciplinary discharge planning process, led by the attending physician.  Recommendations may be updated based on patient status, additional functional criteria and insurance authorization.  Follow Up Recommendations  Skilled nursing-short term rehab (<3 hours/day)     Assistance Recommended at Discharge Frequent or constant Supervision/Assistance  Patient can return home with the following A little help with walking and/or transfers;A little help with bathing/dressing/bathroom;Assistance with cooking/housework;Help with stairs or ramp for entrance   Equipment Recommendations  None recommended by PT    Recommendations for Other Services       Precautions / Restrictions Precautions Precautions: Fall     Mobility  Bed Mobility Overal bed mobility: Needs Assistance Bed Mobility: Sit to Supine       Sit to supine: Max assist   General bed mobility comments: Max Assist to support B LE up onto bed and + 2 to scoot to [facility] as pt was unable/inable to self position.  "I'm crooked'    Transfers Overall transfer level: Needs assistance Equipment used: Rolling walker (2 wheels) Transfers: Sit to/from Stand Sit to Stand: Min assist           General transfer comment: Min A with mod cues for hand placement as pt immdediately fears of falling.  Also assisted with a toilet transfer.  Pt present with poor forward flex posture and  required assist with peri care due to balance deficits.  Pt also c/o weakness/fatigue.  "I got to sit down". HIGH FALL RISK.    Ambulation/Gait Ambulation/Gait assistance: Min assist, +2 safety/equipment Gait Distance (Feet): 18 Feet Assistive device: Rolling walker (2 wheels) Gait Pattern/deviations: Step-to pattern, Decreased stride length, Trunk flexed Gait velocity: decreased     General Gait Details: [place] + 2 Scientist, forensic) assisted amb pt to and from bathroom  only due to MAX c/o weakness/fatigue and fear of falling.  Pt present with limited activity tolerance.  Pt reports her mobility has greatly declined past year.  "I just can't do what I use to".  "I have to stop ans sit alot".  "That's why I use the Rollator.  It has a seat".  Unable to attempt amb in hallway after bathroom trip due to MAX c/o fatigue and BP decreased to 95/48 with HR 67.  "My head feels heavy".   Stairs             Wheelchair Mobility    Modified Rankin (Stroke Patients Only)       Balance                                            Cognition Arousal/Alertness: Awake/alert Behavior During Therapy: WFL for tasks assessed/performed Overall Cognitive Status: Within Functional Limits for tasks assessed                                 General Comments: AxO x 2 with slight impaired cognitive awareness of current medical situation.  Pt repeats "I feel weak".  "I don't want to fall".  "My head feels heavy".  "there's somthing in my throat".        Exercises      General Comments        Pertinent Vitals/Pain Pain Assessment Pain Assessment: Faces Faces Pain Scale: Hurts little more Pain Location: back Pain Descriptors / Indicators: Aching Pain Intervention(s): Monitored during session    Home Living                          Prior Function            PT Goals (current goals can now be found in the care plan section) Progress towards PT goals: Progressing toward goals    Frequency    Min 2X/week      PT Plan Current plan remains appropriate    Co-evaluation              AM-PAC PT "6 Clicks" Mobility   Outcome Measure  Help needed turning from your back to your side while in a flat bed without using bedrails?: A Little Help needed moving from lying on your back to sitting on the side of a flat bed without using bedrails?: A Little Help needed moving to and from a bed to a chair (including a  wheelchair)?: A Little Help needed standing up from a chair using your arms (e.g., wheelchair or bedside chair)?: A Lot Help needed to walk in hospital room?: A Lot Help needed climbing 3-5 steps with a railing? : Total 6 Click Score: 14    End of Session Equipment Utilized During Treatment: Gait belt Activity Tolerance: Patient tolerated treatment well Patient left:  in bed;with call bell/phone within reach;with bed alarm set;with family/visitor present;with nursing/sitter in room Nurse Communication: Mobility status PT Visit Diagnosis: Other abnormalities of gait and mobility (R26.89);Muscle weakness (generalized) (M62.81);History of falling (Z91.81)     Time: 6742-5525 PT Time Calculation (min) (ACUTE ONLY): 17 min  Charges:  $Gait Training: 8-22 mins                     Rica Koyanagi  PTA Acute  Rehabilitation Services Pager      [phone] Office      [phone]

## 2022-01-14 NOTE — TOC Progression Note (Addendum)
Transition of Care [facility]) - Progression Note    Patient Details  Name: Tricia Harrison MRN: [id number] Date of Birth: Dec 08, 1945  [facility] CM/SW Contact  Joaquin Courts, RN Phone Number: 01/14/2022, 1:34 PM  Clinical Narrative:   CM followed up with patient and daughter in law for choice.  Daughter in law shares that after reviewing medicare ratings and visiting one of the facilities that made a bed offer, they feel this will not be a good fit for patient.  Would prefer for patient to return home with [facility] therapy services and an aide.  Choice offered and HHPT/OT/aide set up with [facility], referral given to rep [facility].    MD please place orders for HHPT/OT/aide.    Expected Discharge Plan: [place] Barriers to Discharge: Continued Medical Work up, Ship broker, SNF Pending bed offer  Expected Discharge Plan and Services Expected Discharge Plan: [place] In-house Referral: Clinical Social Work   Post Acute Care Choice: [place] Living arrangements for the past 2 months: [place] Expected Discharge Date: 01/11/22               DME Arranged: N/A DME Agency: NA         HH Agency: [facility] Date HH Agency Contacted: 01/14/22 Time [place]: [id number] Representative spoke with at [place]: [place] (SDOH) Interventions    Readmission Risk Interventions No flowsheet data found.

## 2022-01-15 ENCOUNTER — Encounter: Payer: Self-pay | Admitting: Family Medicine

## 2022-01-15 LAB — GLUCOSE, CAPILLARY: Glucose-Capillary: 70 mg/dL (ref 70–99)

## 2022-01-15 NOTE — Progress Notes (Signed)
Patient and family members were given discharge instructions, and all questions were answered. Patient was stable for discharge and was taken to the main exit by wheelchair.

## 2022-01-15 NOTE — Progress Notes (Unsigned)
{  Select_TRH_Note:[id number]} 

## 2022-01-15 NOTE — TOC Transition Note (Signed)
Transition of Care [facility]) - CM/SW Discharge Note  Patient Details  Name: Tricia Harrison MRN: [id number] Date of Birth: Nov 04, 1946  [facility] CM/SW Contact:  Sherie Don, LCSW Phone Number: 01/15/2022, 10:45 AM  Clinical Narrative: HH orders have been placed. Patient medically stable for discharge. TOC signing off.  Final next level of care: [place] Barriers to Discharge: Barriers Resolved  Patient Goals and CMS Choice Patient states their goals for this hospitalization and ongoing recovery are:: Go home with home health CMS Medicare.gov Compare Post Acute Care list provided to:: Patient Choice offered to / list presented to : Patient  Discharge Plan and Services In-house Referral: Clinical Social Work Post Acute Care Choice: [place]          DME Arranged: N/A DME Agency: NA HH Arranged: PT, OT, Nurse's Aide [place] Agency: [place] Date [place]: 01/14/22 Time [place]: [id number] Representative spoke with at [place]: [place]  Readmission Risk Interventions No flowsheet data found.

## 2022-01-15 NOTE — Discharge Summary (Signed)
Physician Discharge Summary    Patient ID: Tricia Harrison MRN: [id number] DOB/AGE: [birth year]  76 y.o.  Patient Care Team: Lujean Amel, MD as PCP - General (Family Medicine) Leonie Man, MD as PCP - Cardiology (Cardiology) Brand Males, MD as PCP - Pulmonology (Pulmonary Disease) Clarene Essex, MD as Consulting Physician (Gastroenterology) Erline Levine, MD as Consulting Physician (Neurosurgery) Michael Boston, MD as Consulting Physician (General Surgery) Corliss Parish, MD as Consulting Physician (Nephrology)  Admit date: 01/10/2022  Discharge date: 01/15/2022  Hospital Stay = 4 days    Discharge Diagnoses:  Principal Problem:   Chronic cholecystitis Active Problems:   Essential hypertension   Hypothyroidism   Hyperlipidemia LDL goal <100   Fatigue   GERD (gastroesophageal reflux disease)   Diabetes mellitus without complication (HCC)   Obstructive sleep apnea   Uses roller walker   Chronic kidney disease, stage 3b ([place])   Orthostasis   Leukocytosis   Uses wheelchair   Velopharyngeal dysfunction with mild swallowing difficulty   5 Days Post-Op  01/10/2022  POST-OPERATIVE DIAGNOSIS:    Chronic Calculus cholecystitis Fatty steatohepatitis   PROCEDURE:   Laparoscopic lysis of adhesions x35 minutes (half of case) Laparoscopic cholecystectomy with intraoperative cholangiogram (CPT code 41324) Wedge liver biopsy   SURGEON:  Adin Hector, MD, FACS.   OR FINDINGS:  Moderate adhesions to the gallbladder with some wall thickening consistent with chronic cholecystitis.  Liver with some mild fatty change.  Rather hepatic gallbladder with tongue of liver on and so therefore en bloc wedge liver biopsy done.  Cholangiogram showing rather classic biliary anatomy with no choledocholithiasis leak or stricture.   Moderate periumbilical and infraumbilical omental adhesions limiting capacity to do single site cholecystectomy.  Therefore standard  four-port done.  Inspection confirmed adhesions to the anterior abdominal wall were all omental.   Liver: Fatty steatohepatitis  Consults: Physical Therapy, Occupational Therapy, Speech Therapy, Case Management / Social Work, and Internal Medicine [facility])  Hospital Course:   Patient with multiple medical problems found to have biliary colic suspicious for chronic calculus cholecystitis.  Cleared for surgery and underwent cholecystectomy on the day of admission.  Postoperatively, the patient brought up numerous chronic complaints.  She felt she had worsening difficulty swallowing.  Some lightheadedness with walking.  Had an episode where she buckled somewhat when standing.  Found to be orthostatic.  Internal medicine consultation was made.  Some of her blood pressure medications were gradually reduced.  She was rehydrated.  Her orthostasis seem to be minimal and blood pressure improved.  Had a mild episode of hypoglycemia that resolved.  Patient and family concerned about transitioning home right away and wondered if skilled facility was appropriate.  She is evaluated by physical and Occupational Therapy per the family request.  Family concerned about her transitioning home independently and wondered about a skilled nursing facility.  Options were discussed and they were not satisfied with SNF / Rehab options available, so therefore wished to have home health set up for physical and Occupational Therapy.    Of note, patient noted some difficulty with food sticking and swallowing.  Speech therapy evaluation made which showed some Velopharyngeal dysphagia by modified barium swallow.  That seem to stabilize with a dysphagia 3 modified diet as long with lozenges and hydration.  No strong evidence of aspiration.  Patient denied really any abdominal pain and had return of bowel function rather well.  No more episodes of biliary colic which was reassuring.  By the time of discharge, the  patient was  walking with therapy/nursing assistance and walker in the hallways, eating dysphagia 3 food,  having flatus and bowel movements.  Much less orthostasis with reductions in Coreg and Cardizem and ramipril.  Clonidine for breakthrough hypertension per medicine service.  Pain was well-controlled on an oral medications.  Based on meeting discharge criteria and continuing to recover, I felt it was safe for the patient to be discharged from the hospital to further recover with close followup. Postoperative recommendations were discussed in detail.  They are written as well.  Discharged Condition: fair - but stable for her  Discharge Exam: Blood pressure (!) 145/70, pulse 64, temperature 98.8 F (37.1 C), temperature source Oral, resp. rate 18, height 5\' 1"  (1.549 m), weight 79.7 kg, SpO2 99 %.  General: Pt awake/alert/oriented x4 in No acute distress.  Bright and alert.  Mentally sharp.  Asking appropriate questions.  Sitting up in bed. Eyes: PERRL, normal EOM.  Sclera clear.  No icterus Neuro: CN II-XII intact w/o focal sensory/motor deficits. Lymph: No head/neck/groin lymphadenopathy Psych:  No delerium/psychosis/paranoia HENT: Normocephalic, Mucus membranes moist.  No thrush Neck: Supple, No tracheal deviation Chest: No chest wall pain w good excursion CV:  Pulses intact.  Regular rhythm MS: Normal AROM mjr joints.  No obvious deformity Abdomen: Soft.  Nondistended.  Nontender.  No evidence of peritonitis.  No incarcerated hernias. Ext:  SCDs BLE.  No mjr edema.  No cyanosis Skin: No petechiae / purpura   Disposition:    Follow-up Information     Michael Boston, MD Follow up in 3 week(s).   Specialties: General Surgery, Colon and Rectal Surgery Why: To follow up after your operation Contact information: [place] [address] [phone]         Koirala, Dibas, MD Follow up in 1 month(s).   Specialty: Family Medicine Why: To follow up after your hospital  stay Contact information: [address] [place] [address] [phone]         Brand Males, MD .   Specialty: Pulmonary Disease Contact information: [place] [address] [phone]         [facility] [place] Follow up.   Specialty: Home Health Services Why: agency will provide home health physical therapy, occupational therapy, and aide, Contact information: [place] [address] [phone]                 Discharge disposition: 01-Home or Self Care       Discharge Instructions     Call MD for:   Complete by: As directed    FEVER > 101.5 F  (temperatures < 101.5 F are not significant)   Call MD for:  extreme fatigue   Complete by: As directed    Call MD for:  persistant dizziness or light-headedness   Complete by: As directed    Call MD for:  persistant nausea and vomiting   Complete by: As directed    Call MD for:  redness, tenderness, or signs of infection (pain, swelling, redness, odor or green/yellow discharge around incision site)   Complete by: As directed    Call MD for:  severe uncontrolled pain   Complete by: As directed    Diet - low sodium heart healthy   Complete by: As directed    Start with a bland diet such as soups, liquids, starchy foods, low fat foods, etc. the first few  days at home. Gradually advance to a solid, low-fat, high fiber diet by the end of the first week at home.   Add a fiber supplement to your diet (Metamucil, etc) If you feel full, bloated, or constipated, stay on a full liquid or pureed/blenderized diet for a few days until you feel better and are no longer constipated.   Discharge instructions   Complete by: As directed    See Discharge Instructions If you are not getting better after two weeks or are noticing you are getting worse, contact our office [phone] for further advice.  We may need to adjust your medications,  re-evaluate you in the office, send you to the emergency room, or see what other things we can do to help. The clinic staff is available to answer your questions during regular business hours (8:30am-5pm).  Please don't hesitate to call and ask to speak to one of our nurses for clinical concerns.    A surgeon from [facility] is always on call at the hospitals 24 hours/day If you have a medical emergency, go to the nearest emergency room or call 911.   Discharge wound care:   Complete by: As directed    It is good for closed incisions and even open wounds to be washed every day.  Shower every day.  Short baths are fine.  Wash the incisions and wounds clean with soap & water.    You may leave closed incisions open to air if it is dry.   You may cover the incision with clean gauze & replace it after your daily shower for comfort.   Driving Restrictions   Complete by: As directed    You may drive when: - you are no longer taking narcotic prescription pain medication - you can comfortably wear a seatbelt - you can safely make sudden turns/stops without pain.   Increase activity slowly   Complete by: As directed    Start light daily activities --- self-care, walking, climbing stairs- beginning the day after surgery.  Gradually increase activities as tolerated.  Control your pain to be active.  Stop when you are tired.  Ideally, walk several times a day, eventually an hour a day.   Most people are back to most day-to-day activities in a few weeks.  It takes 4-6 weeks to get back to unrestricted, intense activity. If you can walk 30 minutes without difficulty, it is safe to try more intense activity such as jogging, treadmill, bicycling, low-impact aerobics, swimming, etc. Save the most intensive and strenuous activity for last (Usually 4-8 weeks after surgery) such as sit-ups, heavy lifting, contact sports, etc.  Refrain from any intense heavy lifting or straining until you are off  narcotics for pain control.  You will have off days, but things should improve week-by-week. DO NOT PUSH THROUGH PAIN.  Let pain be your guide: If it hurts to do something, don't do it.   Lifting restrictions   Complete by: As directed    If you can walk 30 minutes without difficulty, it is safe to try more intense activity such as jogging, treadmill, bicycling, low-impact aerobics, swimming, etc. Save the most intensive and strenuous activity for last (Usually 4-8 weeks after surgery) such as sit-ups, heavy lifting, contact sports, etc.   Refrain from any intense heavy lifting or straining until you are off narcotics for pain control.  You will have off days, but things should improve week-by-week. DO NOT PUSH THROUGH PAIN.  Let pain be  your guide: If it hurts to do something, don't do it.  Pain is your body warning you to avoid that activity for another week until the pain goes down.   May shower / Bathe   Complete by: As directed    May walk up steps   Complete by: As directed    Sexual Activity Restrictions   Complete by: As directed    You may have sexual intercourse when it is comfortable. If it hurts to do something, stop.       Allergies as of 01/15/2022       Reactions   Atorvastatin Other (See Comments)   Muscle pain   Codeine Nausea And Vomiting   Morphine And Related    B/p drops         Medication List     STOP taking these medications    diltiazem 360 MG 24 hr tablet Commonly known as: CARDIZEM LA Replaced by: diltiazem 180 MG 24 hr capsule       TAKE these medications    acetaminophen 500 MG tablet Commonly known as: TYLENOL Take 1,000 mg by mouth every 6 (six) hours as needed for mild pain.   albuterol 108 (90 Base) MCG/ACT inhaler Commonly known as: VENTOLIN HFA Inhale 2 puffs into the lungs every 6 (six) hours as needed.   azaTHIOprine 50 MG tablet Commonly known as: IMURAN Take 50 mg by mouth daily.   carvedilol 3.125 MG tablet Commonly  known as: COREG Take 1 tablet (3.125 mg total) by mouth 2 (two) times daily with a meal. What changed:  medication strength how much to take   cloNIDine 0.1 MG tablet Commonly known as: Catapres Take 1 tablet (0.1 mg total) by mouth 2 (two) times daily as needed (for blood pressure above 180 when she is SEATEd--not lying down).   diltiazem 180 MG 24 hr capsule Commonly known as: CARDIZEM CD Take 1 capsule (180 mg total) by mouth daily. Replaces: diltiazem 360 MG 24 hr tablet   dorzolamide-timolol 22.3-6.8 MG/ML ophthalmic solution Commonly known as: COSOPT Place 1 drop into both eyes 2 times daily.   Eliquis 5 MG Tabs tablet Generic drug: apixaban TAKE 1 TABLET BY MOUTH  TWICE DAILY   fenofibrate 145 MG tablet Commonly known as: TRICOR Take 145 mg by mouth daily.   Humira Pen 40 MG/0.4ML Pnkt Generic drug: Adalimumab Inject 40 mg into the skin every 14 (fourteen) days.   ketorolac 0.5 % ophthalmic solution Commonly known as: ACULAR Place 1 drop into both eyes 4 (four) times daily. Verified correct strength per Ophthalmalogist progress notes   levothyroxine 50 MCG tablet Commonly known as: SYNTHROID Take 50 mcg by mouth daily before breakfast.   Lumigan 0.01 % Soln Generic drug: bimatoprost Place 1 drop into both eyes at bedtime.   Moderna COVID-19 Vaccine 100 MCG/0.5ML injection Generic drug: COVID-19 mRNA vaccine (Moderna) Inject into the muscle.   omeprazole 20 MG capsule Commonly known as: PRILOSEC Take 20 mg by mouth daily.   ondansetron 4 MG tablet Commonly known as: ZOFRAN Take 1 tablet (4 mg total) by mouth every 8 (eight) hours as needed for nausea.   polyethylene glycol 17 g packet Commonly known as: MIRALAX / GLYCOLAX Take 17 g by mouth daily as needed for mild constipation.   ramipril 2.5 MG capsule Commonly known as: ALTACE Take 2 capsules (5 mg total) by mouth daily. Pt. Is taking 2.5 Daily What changed:  medication strength additional  instructions   rosuvastatin 20  MG tablet Commonly known as: CRESTOR TAKE 1 TABLET BY MOUTH  DAILY AT 6 PM.   traMADol 50 MG tablet Commonly known as: ULTRAM Take 1-2 tablets (50-100 mg total) by mouth every 6 (six) hours as needed for moderate pain or severe pain.   vitamin C 1000 MG tablet Take 1,000 mg by mouth daily.   Vitamin D3 50 MCG (2000 UT) Tabs Take 2,000 Units by mouth daily.               Discharge Care Instructions  (From admission, onward)           Start     Ordered   01/15/22 0000  Discharge wound care:       Comments: It is good for closed incisions and even open wounds to be washed every day.  Shower every day.  Short baths are fine.  Wash the incisions and wounds clean with soap & water.    You may leave closed incisions open to air if it is dry.   You may cover the incision with clean gauze & replace it after your daily shower for comfort.   01/15/22 0749            Significant Diagnostic Studies:  Results for orders placed or performed during the hospital encounter of 01/10/22 (from the past 72 hour(s))  Comprehensive metabolic panel     Status: Abnormal   Collection Time: 01/12/22  8:04 AM  Result Value Ref Range   Sodium 139 135 - 145 mmol/L   Potassium 3.6 3.5 - 5.1 mmol/L   Chloride 110 98 - 111 mmol/L   CO2 23 22 - 32 mmol/L   Glucose, Bld 80 70 - 99 mg/dL    Comment: Glucose reference range applies only to samples taken after fasting for at least 8 hours.   BUN 21 8 - 23 mg/dL   Creatinine, Ser 1.25 (H) 0.44 - 1.00 mg/dL   Calcium 8.5 (L) 8.9 - 10.3 mg/dL   Total Protein 5.7 (L) 6.5 - 8.1 g/dL   Albumin 3.1 (L) 3.5 - 5.0 g/dL   AST 94 (H) 15 - 41 U/L   ALT 43 0 - 44 U/L   Alkaline Phosphatase 23 (L) 38 - 126 U/L   Total Bilirubin 0.3 0.3 - 1.2 mg/dL   GFR, Estimated 45 (L) >60 mL/min    Comment: (NOTE) Calculated using the CKD-EPI Creatinine Equation (2021)    Anion gap 6 5 - 15    Comment: Performed at [facility], [place] [address] [place], [place] [address]  Comprehensive metabolic panel     Status: Abnormal   Collection Time: 01/13/22  4:18 AM  Result Value Ref Range   Sodium 138 135 - 145 mmol/L   Potassium 4.0 3.5 - 5.1 mmol/L   Chloride 108 98 - 111 mmol/L   CO2 25 22 - 32 mmol/L   Glucose, Bld 67 (L) 70 - 99 mg/dL    Comment: Glucose reference range applies only to samples taken after fasting for at least 8 hours.   BUN 20 8 - 23 mg/dL   Creatinine, Ser 1.08 (H) 0.44 - 1.00 mg/dL   Calcium 8.6 (L) 8.9 - 10.3 mg/dL   Total Protein 5.3 (L) 6.5 - 8.1 g/dL   Albumin 2.8 (L) 3.5 - 5.0 g/dL   AST 66 (H) 15 - 41 U/L   ALT 35 0 - 44 U/L   Alkaline Phosphatase 20 (L) 38 - 126 U/L  Total Bilirubin 0.8 0.3 - 1.2 mg/dL   GFR, Estimated 54 (L) >60 mL/min    Comment: (NOTE) Calculated using the CKD-EPI Creatinine Equation (2021)    Anion gap 5 5 - 15    Comment: Performed at [facility], [place] [address], [place], [place] [address]  CBC with Differential/Platelet     Status: Abnormal   Collection Time: 01/13/22  4:18 AM  Result Value Ref Range   WBC 9.0 4.0 - 10.5 K/uL   RBC 2.88 (L) 3.87 - 5.11 MIL/uL   Hemoglobin 9.9 (L) 12.0 - 15.0 g/dL   HCT 29.3 (L) 36.0 - 46.0 %   MCV 101.7 (H) 80.0 - 100.0 fL   MCH 34.4 (H) 26.0 - 34.0 pg   MCHC 33.8 30.0 - 36.0 g/dL   RDW 14.5 11.5 - 15.5 %   Platelets 286 150 - 400 K/uL   nRBC 0.0 0.0 - 0.2 %   Neutrophils Relative % 66 %   Neutro Abs 5.9 1.7 - 7.7 K/uL   Lymphocytes Relative 21 %   Lymphs Abs 1.9 0.7 - 4.0 K/uL   Monocytes Relative 12 %   Monocytes Absolute 1.0 0.1 - 1.0 K/uL   Eosinophils Relative 1 %   Eosinophils Absolute 0.1 0.0 - 0.5 K/uL   Basophils Relative 0 %   Basophils Absolute 0.0 0.0 - 0.1 K/uL   Immature Granulocytes 0 %   Abs Immature Granulocytes 0.04 0.00 - 0.07 K/uL    Comment: Performed at [facility], [place] [address], [place], [place] [address]  Glucose, capillary      Status: None   Collection Time: 01/13/22  7:58 AM  Result Value Ref Range   Glucose-Capillary 73 70 - 99 mg/dL    Comment: Glucose reference range applies only to samples taken after fasting for at least 8 hours.  Glucose, capillary     Status: Abnormal   Collection Time: 01/13/22  3:36 PM  Result Value Ref Range   Glucose-Capillary 111 (H) 70 - 99 mg/dL    Comment: Glucose reference range applies only to samples taken after fasting for at least 8 hours.  Glucose, capillary     Status: Abnormal   Collection Time: 01/13/22 11:22 PM  Result Value Ref Range   Glucose-Capillary 101 (H) 70 - 99 mg/dL    Comment: Glucose reference range applies only to samples taken after fasting for at least 8 hours.  Renal function panel     Status: Abnormal   Collection Time: 01/14/22  4:09 AM  Result Value Ref Range   Sodium 137 135 - 145 mmol/L   Potassium 4.0 3.5 - 5.1 mmol/L   Chloride 105 98 - 111 mmol/L   CO2 27 22 - 32 mmol/L   Glucose, Bld 92 70 - 99 mg/dL    Comment: Glucose reference range applies only to samples taken after fasting for at least 8 hours.   BUN 19 8 - 23 mg/dL   Creatinine, Ser 1.31 (H) 0.44 - 1.00 mg/dL   Calcium 8.6 (L) 8.9 - 10.3 mg/dL   Phosphorus 3.1 2.5 - 4.6 mg/dL   Albumin 2.5 (L) 3.5 - 5.0 g/dL   GFR, Estimated 42 (L) >60 mL/min    Comment: (NOTE) Calculated using the CKD-EPI Creatinine Equation (2021)    Anion gap 5 5 - 15    Comment: Performed at [facility], [place] [address], [place], [place] [address]  CBC with Differential/Platelet  Status: Abnormal   Collection Time: 01/14/22  4:09 AM  Result Value Ref Range   WBC 7.4 4.0 - 10.5 K/uL   RBC 2.93 (L) 3.87 - 5.11 MIL/uL   Hemoglobin 10.0 (L) 12.0 - 15.0 g/dL   HCT 29.1 (L) 36.0 - 46.0 %   MCV 99.3 80.0 - 100.0 fL   MCH 34.1 (H) 26.0 - 34.0 pg   MCHC 34.4 30.0 - 36.0 g/dL   RDW 14.3 11.5 - 15.5 %   Platelets 314 150 - 400 K/uL   nRBC 0.0 0.0 - 0.2 %   Neutrophils Relative % 62 %    Neutro Abs 4.6 1.7 - 7.7 K/uL   Lymphocytes Relative 23 %   Lymphs Abs 1.7 0.7 - 4.0 K/uL   Monocytes Relative 13 %   Monocytes Absolute 1.0 0.1 - 1.0 K/uL   Eosinophils Relative 2 %   Eosinophils Absolute 0.2 0.0 - 0.5 K/uL   Basophils Relative 0 %   Basophils Absolute 0.0 0.0 - 0.1 K/uL   Immature Granulocytes 0 %   Abs Immature Granulocytes 0.02 0.00 - 0.07 K/uL    Comment: Performed at [facility], [place] [address], [place], [place] [address]  Glucose, capillary     Status: None   Collection Time: 01/14/22  7:20 AM  Result Value Ref Range   Glucose-Capillary 86 70 - 99 mg/dL    Comment: Glucose reference range applies only to samples taken after fasting for at least 8 hours.  Glucose, capillary     Status: None   Collection Time: 01/14/22 11:48 AM  Result Value Ref Range   Glucose-Capillary 89 70 - 99 mg/dL    Comment: Glucose reference range applies only to samples taken after fasting for at least 8 hours.  Glucose, capillary     Status: None   Collection Time: 01/14/22  4:52 PM  Result Value Ref Range   Glucose-Capillary 90 70 - 99 mg/dL    Comment: Glucose reference range applies only to samples taken after fasting for at least 8 hours.  Glucose, capillary     Status: None   Collection Time: 01/14/22 10:32 PM  Result Value Ref Range   Glucose-Capillary 94 70 - 99 mg/dL    Comment: Glucose reference range applies only to samples taken after fasting for at least 8 hours.  Glucose, capillary     Status: None   Collection Time: 01/15/22  7:47 AM  Result Value Ref Range   Glucose-Capillary 70 70 - 99 mg/dL    Comment: Glucose reference range applies only to samples taken after fasting for at least 8 hours.    DG Cholangiogram Operative  Result Date: 01/10/2022 CLINICAL DATA:  Intraoperative cholangiogram EXAM: INTRAOPERATIVE CHOLANGIOGRAM TECHNIQUE: Cholangiographic images from the C-arm fluoroscopic device were submitted for interpretation  post-operatively. Please see the procedural report for the amount of contrast and the fluoroscopy time utilized. FLUOROSCOPY: Refer to operative report COMPARISON:  None. FINDINGS: A single fluoroscopic loop is submitted for review taken during intraoperative cholangiogram. Image demonstrates surgical instrumentation and surgical clips overlying the gallbladder fossa. Contrast injection opacifies both the intrahepatic and extrahepatic biliary tree. The bile ducts are normal in caliber without obvious filling defect. There is prompt passage of contrast material through the ampulla into the small bowel. IMPRESSION: Intraoperative cholangiogram as described. Refer to operative report for full details. Electronically Signed   By: Albin Felling M.D.   On: 01/10/2022 09:07   CT HEAD WO CONTRAST (5MM)  Result Date: 01/11/2022 CLINICAL DATA:  Altered mental status.  Cholecystectomy yesterday. EXAM: CT HEAD WITHOUT CONTRAST TECHNIQUE: Contiguous axial images were obtained from the base of the skull through the vertex without intravenous contrast. RADIATION DOSE REDUCTION: This exam was performed according to the departmental dose-optimization program which includes automated exposure control, adjustment of the mA and/or kV according to patient size and/or use of iterative reconstruction technique. COMPARISON:  None. FINDINGS: Brain: Scratch no intracranial hemorrhage, mass effect, or midline shift. No hydrocephalus. The basilar cisterns are patent. Brain volume is normal for age. There is mild periventricular and deep white matter hypodensity. 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 No evidence of territorial infarct or acute ischemia. Enlarged partially empty sella. No extra-axial or intracranial fluid collection. Vascular: Atherosclerosis of skullbase vasculature without hyperdense vessel or abnormal calcification. Skull: No fracture or focal lesion.  Hyperostoses frontalis. Sinuses/Orbits: Paranasal sinuses  and mastoid air cells are clear. The visualized orbits are unremarkable. Bilateral cataract resection. Other: None. IMPRESSION: 1. No acute intracranial abnormality. 2. Mild chronic small vessel ischemic disease. 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 3. Enlarged partially empty sella, typically incidental but can be seen in the setting of idiopathic intracranial hypertension. Electronically Signed   By: Keith Rake M.D.   On: 01/11/2022 20:42    Past Medical History:  Diagnosis Date   Anemia    as a child   Asthma    as a child   Chronic back pain    spinal stenosis and buldging disc;scoliosis   Chronic constipation    occasionally takes something OTC   Chronic kidney disease    Diabetes mellitus without complication ([place])    per pt "Pre" for 20+yrs   Diverticulosis    DVT (deep venous thrombosis) (HCC)    Gallstones    has known about this for 3-22yrs   GERD (gastroesophageal reflux disease)    takes Omeprazole daily   H/O hiatal hernia    Heart murmur    Hemorrhoids    History of blood transfusion    no abnormal reaction noted   History of bronchitis    states its been a long time ago   History of gout    HLD (hyperlipidemia)    takes Fenofibrate daily   HTN (hypertension)    takes Diltiazem and Ramipril daily   Hypothyroidism    takes Synthroid daily   Left knee DJD    Nocturia    Palpitations    started in 2013 and only occasionally;last time noticed about 2-3wks ago and after drinking caffeine   PONV (postoperative nausea and vomiting)    Pulmonary embolism with acute cor pulmonale ([place]) 07/2018   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    Past Surgical History:  Procedure Laterality Date   3-day EVENT MONITOR  01/2019   Mostly normal monitor.  Predominantly sinus rhythm (rate range 48-105 bpm, average 67 bpm) 9 short atrial runs (fastest = 13 beats rate 135 bpm, longest ~17  seconds-101 bpm) -> not noted on symptom log.  No sustained arrhythmias (tachycardia or bradycardia), or pauses..  Symptoms are noted with PVCs.   CHOLECYSTECTOMY N/A 01/10/2022   Procedure: LAPAROSCOPIC CHOLECYSTECTOMY WITH INTRAOPERATIVE CHOLANGIOGRAM;  Surgeon: Michael Boston, MD;  Location: [facility];  Service: General;  Laterality: N/A;   COLONOSCOPY     CT ANGIOGRAM OF CHEST - PE PROTOCOL  07/2018   Bilateral  nonocclusive pulmonary emboli evidence of right heart strain consistent with "submassive "/intermediate PE.  -->  Code PE called.   DILATION AND CURETTAGE OF UTERUS     multiple about 6-7 times   ESOPHAGOGASTRODUODENOSCOPY     NM VQ LUNG SCAN ([facility] HX)  11/23/2018   Low probability for PE   RIGHT/LEFT HEART CATH AND CORONARY ANGIOGRAPHY N/A 08/19/2018   Procedure: RIGHT/LEFT HEART CATH AND CORONARY ANGIOGRAPHY;  Surgeon: Jettie Booze, MD;  Location: [facility];  Service: Cardiovascular:: Nonobstructive CAD.  Mild pulmonary pretension.  Mean PA pressure 26 mmHg with PA P 48/20 mmHg.  Normal LVEDP.   THYROID SURGERY Right 2010   TOTAL HIP ARTHROPLASTY Left 2005   TOTAL KNEE ARTHROPLASTY Left 02/21/2014   Procedure: TOTAL KNEE ARTHROPLASTY;  Surgeon: Lorn Junes, MD;  Location: [place];  Service: Orthopedics;  Laterality: Left;   TRANSTHORACIC ECHOCARDIOGRAM  07/2018    (In setting of acute PE) mild LVH.  EF 55 to 60%.  GR 1 DD.  Aortic sclerosis but no stenosis.  Mild regurgitation.  Mild RA dilation.  Moderate LA dilation.  Moderate tricuspid regurgitation with severe primary hypertension estimated PA pressure 71 mmHg.  RV poorly visualized   TRANSTHORACIC ECHOCARDIOGRAM  10/2018 - 11/2019   a) EF 60-65%.  GR 1 DD.  Focal basal LVH. Mod AI/ no AS. Mild LA dilation.  Normal RV size and fxn.  Mod PI w/ mild TR.  Peak PAP ~ 38 mmHg; b) Normal EF 60 to 65%.  Moderate LVH-GR 1 DD.  R WMA.  Mild AS w/ trivial AI.  Grossly normal PA, RV and RA size.  Normal RV function.  Normal PA  pressures.   TRANSTHORACIC ECHOCARDIOGRAM  01/2021    EF 55%.  Normal LV function.  Mild LVH.  GR 1 DD.  Normal PA pressures.  Severe LA dilation.  (Consistent with more significant diastolic dysfunction).  Mild-possibly mild to moderate aortic insufficiency.  Borderline elevated RAP.  Trivial pericardial effusion.  (Stable)   VAGINAL HYSTERECTOMY  1979    Social History   Socioeconomic History   Marital status: Single    Spouse name: Not on file   Number of children: Not on file   Years of education: Not on file   Highest education level: Not on file  Occupational History   Occupation: Retired  Tobacco Use   Smoking status: Former    Packs/day: 0.50    Years: 20.00    Pack years: 10.00    Types: Cigarettes    Start date: 09/04/1964    Quit date: 11/25/1993    Years since quitting: 28.1   Smokeless tobacco: Never   Tobacco comments:    quit smoking in 1995  Vaping Use   Vaping Use: Never used  Substance and Sexual Activity   Alcohol use: Yes    Comment: Socially.   Drug use: No   Sexual activity: Not on file  Other Topics Concern   Not on file  Social History Narrative   Pt lives w/ sister and nephew.   Social Determinants of Health   Financial Resource Strain: Not on file  Food Insecurity: Not on file  Transportation Needs: Not on file  Physical Activity: Not on file  Stress: Not on file  Social Connections: Not on file  Intimate Partner Violence: Not on file    Family History  Problem Relation Age of Onset   Uterine cancer Mother    Heart disease Mother  Hypertension Mother    Diabetes Mother    Cirrhosis Father    Pneumonia Father    Lymphoma Sister        ,non hodgkin    Diabetes Sister    Breast cancer Neg Hx     Current Facility-Administered Medications  Medication Dose Route Frequency Provider Last Rate Last Admin   acetaminophen (TYLENOL) tablet 1,000 mg  1,000 mg Oral Q6H Michael Boston, MD   1,000 mg at 01/15/22 0541   albuterol (PROVENTIL)  (2.5 MG/3ML) 0.083% nebulizer solution 3 mL  3 mL Inhalation Q6H PRN Michael Boston, MD       apixaban Arne Cleveland) tablet 5 mg  5 mg Oral BID Michael Boston, MD   5 mg at 01/14/22 2024   ascorbic acid (VITAMIN C) tablet 1,000 mg  1,000 mg Oral Daily Michael Boston, MD   1,000 mg at 01/14/22 1105   azaTHIOprine (IMURAN) tablet 50 mg  50 mg Oral Daily Michael Boston, MD   50 mg at 01/14/22 1127   bisacodyl (DULCOLAX) suppository 10 mg  10 mg Rectal Daily PRN Michael Boston, MD       carvedilol (COREG) tablet 3.125 mg  3.125 mg Oral BID WC Nita Sells, MD   3.125 mg at 01/14/22 1819   Chlorhexidine Gluconate Cloth 2 % PADS 6 each  6 each Topical Once Michael Boston, MD       cholecalciferol (VITAMIN D) tablet 2,000 Units  2,000 Units Oral Daily Michael Boston, MD   2,000 Units at 01/14/22 1111   diltiazem (CARDIZEM CD) 24 hr capsule 180 mg  180 mg Oral Daily Nita Sells, MD   180 mg at 01/14/22 1115   diphenhydrAMINE (BENADRYL) 12.5 MG/5ML elixir 12.5 mg  12.5 mg Oral Q6H PRN Michael Boston, MD       Or   diphenhydrAMINE (BENADRYL) injection 12.5 mg  12.5 mg Intravenous Q6H PRN Michael Boston, MD       dorzolamide-timolol (COSOPT) 22.3-6.8 MG/ML ophthalmic solution 1 drop  1 drop Both Eyes BID Michael Boston, MD   1 drop at 01/14/22 2025   fenofibrate tablet 160 mg  160 mg Oral Daily Michael Boston, MD   160 mg at 01/14/22 1111   hydrALAZINE (APRESOLINE) injection 5 mg  5 mg Intravenous Q6H PRN Nita Sells, MD       HYDROmorphone (DILAUDID) injection 0.5-2 mg  0.5-2 mg Intravenous Q4H PRN Michael Boston, MD       ketorolac (ACULAR) 0.5 % ophthalmic solution 1 drop  1 drop Both Eyes QID Michael Boston, MD   1 drop at 01/14/22 2024   latanoprost (XALATAN) 0.005 % ophthalmic solution 1 drop  1 drop Both Eyes QHS Michael Boston, MD   1 drop at 01/14/22 2025   levothyroxine (SYNTHROID) tablet 50 mcg  50 mcg Oral Q0600 Michael Boston, MD   50 mcg at 01/15/22 0541   magnesium hydroxide (MILK OF  MAGNESIA) suspension 30 mL  30 mL Oral Daily PRN Michael Boston, MD       menthol-cetylpyridinium (CEPACOL) lozenge 3 mg  1 lozenge Oral PRN Nita Sells, MD       methocarbamol (ROBAXIN) tablet 500 mg  500 mg Oral Q6H PRN Michael Boston, MD       ondansetron (ZOFRAN-ODT) disintegrating tablet 4 mg  4 mg Oral Q6H PRN Michael Boston, MD       Or   ondansetron (ZOFRAN) injection 4 mg  4 mg Intravenous Q6H PRN Michael Boston, MD  oxybutynin (DITROPAN) tablet 2.5 mg  2.5 mg Oral BID Nita Sells, MD   2.5 mg at 01/14/22 2024   oxyCODONE (Oxy IR/ROXICODONE) immediate release tablet 5-10 mg  5-10 mg Oral Q4H PRN Michael Boston, MD       prochlorperazine (COMPAZINE) tablet 10 mg  10 mg Oral Q6H PRN Michael Boston, MD       Or   prochlorperazine (COMPAZINE) injection 5-10 mg  5-10 mg Intravenous Q6H PRN Michael Boston, MD       rosuvastatin (CRESTOR) tablet 20 mg  20 mg Oral QPC supper Michael Boston, MD   20 mg at 01/14/22 1820   simethicone (MYLICON) chewable tablet 40 mg  40 mg Oral Q6H PRN Michael Boston, MD       traMADol Veatrice Bourbon) tablet 50-100 mg  50-100 mg Oral Q6H PRN Michael Boston, MD         Allergies  Allergen Reactions   Atorvastatin Other (See Comments)    Muscle pain   Codeine Nausea And Vomiting   Morphine And Related     B/p drops     Signed: Morton Peters, MD, FACS, MASCRS Esophageal, Gastrointestinal & Colorectal Surgery Robotic and Minimally Invasive Surgery  [facility], [place]  [address], [place], [place] [address] [phone] Fax [phone] Main  CONTACT INFORMATION:  Weekday (9AM-5PM): Call [facility] main office at [phone]  Weeknight (5PM-9AM) or Weekend/Holiday: Check [email or web address] (password " [email or web address]") for General Surgery [facility] coverage  (Please, do not use SecureChat as it is not reliable communication to operating surgeons for immediate patient care)       01/15/2022, 7:49 AM

## 2022-01-15 NOTE — Progress Notes (Signed)
Discharge summary done by general surgery  I have reviewed the [facility] and agree with the changes we implemented this hospitalization Patient is stable from my perspective for close follow-up with Dr. Moshe Cipro of renal who is aware of the patient and will follow up in the outpatient setting with her   No charge thank you for opportunity to participate in this patient's care,   Verneita Griffes, MD [facility] 7:57 AM

## 2022-01-15 NOTE — Progress Notes (Signed)
Occupational Therapy Treatment Patient Details Name: Tricia Harrison MRN: [id number] DOB: 08/15/1946 Today's Date: 01/15/2022   History of present illness 76 year old female DM TY 2, HTN, HL D, reflux, hypothyroid cardiac cath 2019 pulmonary hypertension nonobstructive CAD  Left lower extremity DVT 07/2018 admission  CKD 3B, HLD, HTN     Admit to 2.16.23 symptomatic biliary colic-status post lap chole 01/10/2022-felt lightheaded weak and patient was orthostatic     Postoperatively noted slight rise in her BUN/creatinine above baseline of 1.2-21/1.4  WBC is trended down postoperatively to 12.3, hemoglobin is 10.0  CT head 2/17 showed partial empty sella  UA trace leukocytes negative nitrite--no urine culture was obtained?   OT comments  Patient overall min G assist for ambulation to/from bathroom with min cues for safety getting off toilet. Patient min G in standing for peri care and completed g/h and upper body bathing seated at sink due to limited standing tolerance. Patient reports family declined SNF and plan to bring her home therefore updated D/C recommendations to [facility].   Recommendations for follow up therapy are one component of a multi-disciplinary discharge planning process, led by the attending physician.  Recommendations may be updated based on patient status, additional functional criteria and insurance authorization.    Follow Up Recommendations  Home health OT (family declinging SNF)    Assistance Recommended at Discharge Frequent or constant Supervision/Assistance  Patient can return home with the following  A little help with walking and/or transfers;A lot of help with bathing/dressing/bathroom;Assistance with cooking/housework   Equipment Recommendations  None recommended by OT       Precautions / Restrictions Precautions Precautions: Fall Precaution Comments: reports 2 falls this week Restrictions Weight Bearing Restrictions: No       Mobility Bed Mobility Overal  bed mobility: Needs Assistance Bed Mobility: Supine to Sit     Supine to sit: Min assist, HOB elevated     General bed mobility comments: Assist to upright trunk to sitting       Balance Overall balance assessment: History of Falls, Needs assistance Sitting-balance support: Feet supported Sitting balance-Leahy Scale: Good     Standing balance support: Reliant on assistive device for balance Standing balance-Leahy Scale: Poor                             ADL either performed or assessed with clinical judgement   ADL Overall ADL's : Needs assistance/impaired     Grooming: Oral care;Wash/dry face;Wash/dry hands;Set up;Sitting Grooming Details (indicate cue type and reason): Patient unable to tolerate extended standing therefore sat at sink to perfom g/h Upper Body Bathing: Set up;Sitting Upper Body Bathing Details (indicate cue type and reason): sat on BSC at sink to perform UB bathing             Toilet Transfer: Min guard;Ambulation;Regular Toilet;Rolling walker (2 wheels) Toilet Transfer Details (indicate cue type and reason): Min cues for hand placement Toileting- Clothing Manipulation and Hygiene: Min guard;Sit to/from stand Toileting - Clothing Manipulation Details (indicate cue type and reason): Patient able to perform pericare after voiding     Functional mobility during ADLs: Min guard;Cueing for safety;Rolling walker (2 wheels)        Cognition Arousal/Alertness: Awake/alert Behavior During Therapy: WFL for tasks assessed/performed Overall Cognitive Status: Within Functional Limits for tasks assessed  Pertinent Vitals/ Pain       Pain Assessment Pain Assessment: Faces Faces Pain Scale: Hurts little more Pain Location: back Pain Descriptors / Indicators: Aching Pain Intervention(s): Monitored during session         Frequency  Min 2X/week        Progress  Toward Goals  OT Goals(current goals can now be found in the care plan section)  Progress towards OT goals: Progressing toward goals  Acute Rehab OT Goals Patient Stated Goal: Home with [facility] and family assist OT Goal Formulation: With patient Time For Goal Achievement: 01/25/22 Potential to Achieve Goals: Good ADL Goals Pt Will Perform Lower Body Dressing: with supervision;sit to/from stand Pt Will Transfer to Toilet: with supervision;ambulating;regular height toilet;grab bars Pt Will Perform Toileting - Clothing Manipulation and hygiene: with supervision;sit to/from stand Additional ADL Goal #1: Patient will perform 10 min functional activity or exercise activity as evidence of improving activity tolerance  Plan Discharge plan needs to be updated       AM-PAC OT "6 Clicks" Daily Activity     Outcome Measure   Help from another person eating meals?: None Help from another person taking care of personal grooming?: A Little Help from another person toileting, which includes using toliet, bedpan, or urinal?: A Little Help from another person bathing (including washing, rinsing, drying)?: A Lot Help from another person to put on and taking off regular upper body clothing?: A Little Help from another person to put on and taking off regular lower body clothing?: A Lot 6 Click Score: 17    End of Session Equipment Utilized During Treatment: Rolling walker (2 wheels)  OT Visit Diagnosis: Muscle weakness (generalized) (M62.81)   Activity Tolerance Patient tolerated treatment well   Patient Left in chair;with call bell/phone within reach;with chair alarm set   Nurse Communication Mobility status        Time: 0272-5366 OT Time Calculation (min): 21 min  Charges: OT General Charges $OT Visit: 1 Visit OT Treatments $Self Care/Home Management : 8-22 mins  Delbert Phenix OT OT pager: [phone]   Rosemary Holms 01/15/2022, 10:52 AM

## 2022-02-04 DIAGNOSIS — K5909 Other constipation: Secondary | ICD-10-CM | POA: Diagnosis present

## 2022-02-25 ENCOUNTER — Other Ambulatory Visit: Payer: Self-pay | Admitting: Gastroenterology

## 2022-02-25 DIAGNOSIS — R1310 Dysphagia, unspecified: Secondary | ICD-10-CM

## 2022-02-27 ENCOUNTER — Other Ambulatory Visit: Payer: Self-pay | Admitting: Gastroenterology

## 2022-02-27 ENCOUNTER — Ambulatory Visit
Admission: RE | Admit: 2022-02-27 | Discharge: 2022-02-27 | Disposition: A | Discharge status: Home / Self Care | Payer: Medicare HMO | Source: Ambulatory Visit | Attending: Gastroenterology | Admitting: Gastroenterology

## 2022-02-27 DIAGNOSIS — R1310 Dysphagia, unspecified: Secondary | ICD-10-CM

## 2022-03-21 ENCOUNTER — Other Ambulatory Visit: Payer: Self-pay | Admitting: Family Medicine

## 2022-03-21 DIAGNOSIS — Z1231 Encounter for screening mammogram for malignant neoplasm of breast: Secondary | ICD-10-CM

## 2022-04-02 ENCOUNTER — Ambulatory Visit: Payer: Medicare HMO

## 2022-04-15 NOTE — Progress Notes (Signed)
Primary Care Provider: Lujean Amel, Harrison Cardiologist: Tricia Harrison Electrophysiologist: None Tricia Harrison (Gastroenterology) Tricia Harrison (Pulmonary Disease) Tricia Harrison [facility] General Surgery)  Clinic Note: Chief Complaint  Patient presents with   Follow-up    Postop-slow progressive recovery.  Very deconditioned.  Walking with a walker.   Hypertension    Labile, up and down.  Was low perioperatively therefore medications were reduced.  Now has been running high of late.   ===================================  ASSESSMENT/PLAN   Problem List Items Addressed This Visit       Cardiology Problems   Essential hypertension - Primary (Chronic)    Blood pressure is high today, but she says has been up and down.  At this point, with her having dizziness and generalized weakness, would prefer to allow for permissive hypertension.  She does have clonidine which can be used for systolic pressures greater than 180 mmHg.  Otherwise we will hold off on further aggressive management for now.    Continue low-dose of diltiazem and carvedilol.       Relevant Orders   EKG 12-Lead (Completed)   Hyperlipidemia LDL goal <100 (Chronic)    Most recent labs from January-preoperatively showed LDL of 80.  I imagine she should be due for labs to be checked by her PCP sometime over the summertime.  She did not notice any improvement in her fatigue and myalgias with statin holiday.  Now is back on rosuvastatin.  Probably need to allow her to heal more fully from her surgery.  She is on fenofibrate and rosuvastatin.         Orthostasis    She had orthostatic hypotension in the hospital postoperatively.  Probably was dehydrated.  Medications were reduced diltiazem and carvedilol.  When blood pressures became higher, they added PRN clonidine.  She has not used any.  Recommended continued hydration, and wearing support stockings when possible.   Also increase foot elevation.       PVC (premature ventricular contraction) (Chronic)    Normal echocardiogram and stress evaluation.  Benign findings.  Not really much of a problem for her right now.  Heart rate is 64 bpm on current dose of carvedilol and diltiazem.  We will hold off on titrating medications any further.  Would like to allow for some mild permissive hypertension.       Relevant Orders   EKG 12-Lead (Completed)   Atherosclerosis of aorta (HCC) (Chronic)    Monitoring blood pressure and lipids.       Relevant Orders   EKG 12-Lead (Completed)     Other   History of pulmonary embolism (Chronic)    Back on Eliquis.  No major bleeding issues.  Defer management of anticoagulation to PCP.       Rapid palpitations (Chronic)    Stable on combination of carvedilol and diltiazem.  Seems to be doing okay despite reduced doses.       Relevant Orders   EKG 12-Lead (Completed)   Morbid obesity ([place]) (Chronic)    She lost weight postoperatively.  Hopefully, she will be able to keep the weight off.       DOE (dyspnea on exertion)   Relevant Orders   EKG 12-Lead (Completed)   Obstructive sleep apnea (Chronic)    ===================================  HPI:    Tricia Harrison is a 76 y.o. female with a PMH notable for Submassive PE (pulmonary hypertension), Bilateral Edema, Essential Hypertension, Palpitations/PVCs, Progressive DOE who  presents today for 64-monthfollow-up (postop) at the request of Tricia Harrison.   I last saw Tricia Harrison March 2022 and then virtual follow-up in April to discuss results of her 3-day (for increased palpitations) monitor and echocardiogram.  BP was well controlled.  Noted still having worsening dyspnea.  Poor stamina and dyspnea with heart rate going up.  Noted dyspnea with PT.  Did not necessarily notice any improvement with statin holiday.  Not able to tolerate CPAP.  Tricia Scholtenwas last seen on Nov 09, 2021 by  Tricia Harrison noting increased work of breathing with increased physical activity.  Has become sedentary because of her left knee and back pain.  Able to do ADLs but no formal exercise.  This still noted palpitations with exertion noted occasional lower extremity edema-dependent.  BP elevated-ramipril dose increased to 5 mg.  Discussed support stockings.  He continues to lose weight  Recent Hospitalizations:  01/10/2022: Laparoscopic Cholecystectomy -> moderate adhesions to the gallbladder with some wall thickening consistent with chronic cholecystitis.  Some fatty changes of the liver.  Classic biliary anatomy with no choledocholithiasis, leak or stricture.  Moderate periumbilical and infraumbilical omental adhesions limiting capacity to do single site cholecystectomy. Multiple complaints postoperatively: Worsening difficulty swallowing.  Lightheadedness with walking.  Her legs "buckled when standing" -found to be orthostatic.  BP meds reduced.  Rehydrated, and orthostasis improved.  Coreg, Cardizem and ramipril dose was decreased. Family was not satisfied with SNF rehab options available and therefore chose for home health PT OT. Difficulty with food sticking/swallowing.  Speech therapy evaluation showed Tricia Harrison with modified barium swallow.  Discharged on Harrison 3 diet. [facility] => she told them that she was having difficulty swallowing with slurred speech.  Still having difficulty swallowing. Flexible laryngoscopy revealed patent anterior nasal cavity with minimal crusting.  No discharge or infection.  Normal base of tongue and supraglottis.  Normal vocal cord mobility without vocal cord nodule, mass polyp or tumor.  Normal hypopharynx without mass pooling of secretions or aspirations. = Recommended speech path versus GI evaluation.  Seen by Tricia Harrison March 13: Remained in a wheelchair.  She was disappointed about her slow recovery.   Still having issues with constipation.  Also noted swallowing difficulty.  Starting to use boost shakes to supplement diet as her appetite normalized. => [facility]  Reviewed  CV studies:    The following studies were reviewed today: (if available, images/films reviewed: From Epic Chart or Care Everywhere) No recent studies:  Interval History:   PLenya Sternereturns here today for follow-up very weak and debilitated following her surgery.  She had a long protracted recovery and is weaker than she had been before.  Now having to walk with a walker mostly because her back is limiting her.  She is doing 1 day a week rehab now.  Slow progression postoperatively.  She still complains about long hospitalization. She is quite deconditioned and therefore does have exertional dyspnea but no real PND or orthopnea.  She has ankle edema at the end of the day, mostly because she is not moving much.    She has noted that her blood pressures have been up and down quite a bit over the last several months.  Denied any headaches or dizziness, wooziness associated with hypertension.  Blood pressure medications have been adjusted in the hospital with her carvedilol dose and diltiazem both reduced.  She  has PRN clonidine for high blood pressures but has not required any.  CV Review of Symptoms (Summary) Cardiovascular ROS: positive for - dyspnea on exertion and rare occasional palpitations.  Nothing prolonged.  Exercise intolerance and deconditioning.  Dizziness but not related to palpitations.  Labile blood pressures without associated headaches or blurred vision. negative for - chest pain, irregular heartbeat, orthopnea, paroxysmal nocturnal dyspnea, rapid heart rate, shortness of breath, or syncope/near syncope or TIA/amaurosis fugax, claudication   REVIEWED OF SYSTEMS   Review of Systems  Constitutional:  Positive for malaise/fatigue (Still has low energy level.  Slowly progressing  postoperatively.).  HENT:  Positive for congestion.   Respiratory:  Positive for shortness of breath (Deconditioned). Negative for cough and wheezing.   Cardiovascular:  Positive for leg swelling (She notes bilateral ankle swelling.).       Labile blood pressures, go up and down.  Gastrointestinal:  Positive for constipation. Negative for blood in stool and melena.       Significant dyspepsia/Harrison postoperatively.  For a while she was unable to tolerate solid foods.  Genitourinary:  Negative for dysuria, frequency and hematuria (Microscopic but not visible).  Musculoskeletal:  Positive for back pain (Significantly limits walking.). Negative for falls (Careful to use walker.) and myalgias.  Neurological:  Positive for dizziness and weakness (Generalized, mostly legs).  Psychiatric/Behavioral:  Positive for memory loss. Negative for depression (At a minimum dysthymia). The patient is nervous/anxious and has insomnia.    I have reviewed and (if needed) personally updated the patient's problem list, medications, allergies, past medical and surgical history, social and family history.   PAST MEDICAL HISTORY   Past Medical History:  Diagnosis Date   Anemia    as a child   Asthma    as a child   Chronic back pain    spinal stenosis and buldging disc;scoliosis   Chronic constipation    occasionally takes something OTC   Chronic kidney disease    Diabetes mellitus without complication ([place])    per pt "Pre" for 20+yrs   Diverticulosis    DVT (deep venous thrombosis) (HCC)    Gallstones    has known about this for 3-78yr   GERD (gastroesophageal reflux disease)    takes Omeprazole daily   H/O hiatal hernia    Heart murmur    Hemorrhoids    History of blood transfusion    no abnormal reaction noted   History of bronchitis    states its been a long time ago   History of gout    HLD (hyperlipidemia)    takes Fenofibrate daily   HTN (hypertension)    takes Diltiazem and Ramipril  daily   Hypothyroidism    takes Synthroid daily   Left knee DJD    Nocturia    Palpitations    started in 2013 and only occasionally;last time noticed about 2-3wks ago and after drinking caffeine   PONV (postoperative nausea and vomiting)    Pulmonary embolism with acute cor pulmonale ([facility] 07[place]2018   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    PAST SURGICAL HISTORY   Past Surgical History:  Procedure Laterality Date   3-day EVENT MONITOR  01/2019   Mostly normal monitor.  Predominantly sinus rhythm (rate range 48-105 bpm, average 67 bpm) 9 short atrial runs (fastest = 13 beats rate 135 bpm, longest ~17 seconds-101 bpm) -> not noted on symptom log.  No  sustained arrhythmias (tachycardia or bradycardia), or pauses..  Symptoms are noted with PVCs.   CHOLECYSTECTOMY N/A 01/10/2022   Procedure: LAPAROSCOPIC CHOLECYSTECTOMY WITH INTRAOPERATIVE CHOLANGIOGRAM;  Surgeon: Michael Boston, Harrison;  Location: [facility];  Service: General;  Laterality: N/A;   COLONOSCOPY     CT ANGIOGRAM OF CHEST - PE PROTOCOL  07/2018   Bilateral nonocclusive pulmonary emboli evidence of right heart strain consistent with "submassive "/intermediate PE.  -->  Code PE called.   DILATION AND CURETTAGE OF UTERUS     multiple about 6-7 times   ESOPHAGOGASTRODUODENOSCOPY     NM VQ LUNG SCAN ([facility] HX)  11/23/2018   Low probability for PE   RIGHT/LEFT HEART CATH AND CORONARY ANGIOGRAPHY N/A 08/19/2018   Procedure: RIGHT/LEFT HEART CATH AND CORONARY ANGIOGRAPHY;  Surgeon: Jettie Booze, Harrison;  Location: [place] [facility];  Service: Cardiovascular:: Nonobstructive CAD.  Mild pulmonary pretension.  Mean PA pressure 26 mmHg with PA P 48/20 mmHg.  Normal LVEDP.   THYROID SURGERY Right 2010   TOTAL HIP ARTHROPLASTY Left 2005   TOTAL KNEE ARTHROPLASTY Left 02/21/2014   Procedure: TOTAL KNEE ARTHROPLASTY;  Surgeon: Lorn Junes, Harrison;  Location: [place];  Service:  Orthopedics;  Laterality: Left;   TRANSTHORACIC ECHOCARDIOGRAM  07/2018    (In setting of acute PE) mild LVH.  EF 55 to 60%.  GR 1 DD.  Aortic sclerosis but no stenosis.  Mild regurgitation.  Mild RA dilation.  Moderate LA dilation.  Moderate tricuspid regurgitation with severe primary hypertension estimated PA pressure 71 mmHg.  RV poorly visualized   TRANSTHORACIC ECHOCARDIOGRAM  10/2018 - 11/2019   a) EF 60-65%.  GR 1 DD.  Focal basal LVH. Mod AI/ no AS. Mild LA dilation.  Normal RV size and fxn.  Mod PI w/ mild TR.  Peak PAP ~ 38 mmHg; b) Normal EF 60 to 65%.  Moderate LVH-GR 1 DD.  R WMA.  Mild AS w/ trivial AI.  Grossly normal PA, RV and RA size.  Normal RV function.  Normal PA pressures.   TRANSTHORACIC ECHOCARDIOGRAM  01/2021    EF 55%.  Normal LV function.  Mild LVH.  GR 1 DD.  Normal PA pressures.  Severe LA dilation.  (Consistent with more significant diastolic dysfunction).  Mild-possibly mild to moderate aortic insufficiency.  Borderline elevated RAP.  Trivial pericardial effusion.  (Stable)   VAGINAL HYSTERECTOMY  1979   Zio patch March 2022: Predominant rhythm is normal sinus rhythm: Minimum rate 48 bpm, maximum 105 bpm with an average of 67 bpm. Rare PACs with occasional ~1% PVCs. No real couplets or triplets. Very short burst of trigeminy. 9 Atrial Runs: Fastest was 13 beats at a rate of 135 bpm, longest was just under 17 seconds with a rate of 101 beats. -> These were not noted on symptoms Symptoms noted with sinus rhythm and PVCs. No abnormal tachycardic or bradycardic rhythms. No pauses.  Immunization History  Administered Date(s) Administered   Influenza, High Dose Seasonal PF 08/20/2018   Moderna Covid-19 Vaccine Bivalent Booster 58yr & up 08/30/2021    MEDICATIONS/ALLERGIES   Current Meds  Medication Sig   acetaminophen (TYLENOL) 500 MG tablet Take 1,000 mg by mouth every 6 (six) hours as needed for mild pain.   albuterol (VENTOLIN HFA) 108 (90 Base) MCG/ACT inhaler  Inhale 2 puffs into the lungs every 6 (six) hours as needed.   azaTHIOprine (IMURAN) 50 MG tablet Take 50 mg by mouth daily.   bimatoprost (LUMIGAN) 0.01 %  SOLN Place 1 drop into both eyes at bedtime.   carvedilol (COREG) 3.125 MG tablet Take 1 tablet (3.125 mg total) by mouth 2 (two) times daily with a meal.   cloNIDine (CATAPRES) 0.1 MG tablet Take 1 tablet (0.1 mg total) by mouth 2 (two) times daily as needed (for blood pressure above 180 when she is SEATEd--not lying down).   COVID-19 mRNA vaccine, Moderna, 100 MCG/0.5ML injection Inject into the muscle.   diltiazem (CARDIZEM CD) 180 MG 24 hr capsule Take 1 capsule (180 mg total) by mouth daily.   dorzolamide-timolol (COSOPT) 22.3-6.8 MG/ML ophthalmic solution Place 1 drop into both eyes 2 times daily.   fenofibrate (TRICOR) 145 MG tablet Take 145 mg by mouth daily.   HUMIRA PEN 40 MG/0.4ML PNKT Inject 40 mg into the skin every 14 (fourteen) days.   ketorolac (ACULAR) 0.5 % ophthalmic solution Place 1 drop into both eyes 4 (four) times daily. Verified correct strength per Ophthalmalogist progress notes   levothyroxine (SYNTHROID, LEVOTHROID) 50 MCG tablet Take 50 mcg by mouth daily before breakfast.   omeprazole (PRILOSEC) 20 MG capsule Take 20 mg by mouth daily.   ondansetron (ZOFRAN) 4 MG tablet Take 1 tablet (4 mg total) by mouth every 8 (eight) hours as needed for nausea.   polyethylene glycol (MIRALAX / GLYCOLAX) 17 g packet Take 17 g by mouth daily as needed for mild constipation.   rosuvastatin (CRESTOR) 20 MG tablet TAKE 1 TABLET BY MOUTH  DAILY AT 6 PM.   traMADol (ULTRAM) 50 MG tablet Take 1-2 tablets (50-100 mg total) by mouth every 6 (six) hours as needed for moderate pain or severe pain.   [DISCONTINUED] apixaban (ELIQUIS) 5 MG TABS tablet TAKE 1 TABLET BY MOUTH  TWICE DAILY    Allergies  Allergen Reactions   Atorvastatin Other (See Comments)    Muscle pain   Codeine Nausea And Vomiting   Morphine And Related     B/p drops      SOCIAL HISTORY/FAMILY HISTORY   Reviewed in Epic:  Pertinent findings:  Social History   Tobacco Use   Smoking status: Former    Packs/day: 0.50    Years: 20.00    Pack years: 10.00    Types: Cigarettes    Start date: 09/04/1964    Quit date: 11/25/1993    Years since quitting: 28.4   Smokeless tobacco: Never   Tobacco comments:    quit smoking in 1995  Vaping Use   Vaping Use: Never used  Substance Use Topics   Alcohol use: Yes    Comment: Socially.   Drug use: No   Social History   Social History Narrative   Pt lives w/ sister and nephew.    OBJCTIVE -PE, EKG, labs   Wt Readings from Last 3 Encounters:  04/16/22 164 lb 12.8 oz (74.8 kg)  01/10/22 175 lb 11.3 oz (79.7 kg)  01/01/22 176 lb 4 oz (79.9 kg)    Physical Exam: BP (!) 164/84   Pulse 64   Ht '5\' 1"'$  (1.549 m)   Wt 164 lb 12.8 oz (74.8 kg)   SpO2 100%   BMI 31.14 kg/m  Physical Exam Vitals reviewed.  Constitutional:      Appearance: She is obese. She is ill-appearing (Chronically ill-appearing.  Sitting in a wheelchair.  Profoundly weak.).     Comments: Notable weight loss, unintentional during illness.  HENT:     Head: Normocephalic and atraumatic.  Neck:     Vascular: No carotid  bruit.  Cardiovascular:     Rate and Rhythm: Normal rate and regular rhythm. No extrasystoles are present.    Pulses: Decreased pulses (Diminished, palpable pedal pulses).     Heart sounds: S1 normal and S2 normal. Heart sounds are distant. Murmur (1/6 SEM at RUSB-neck) heard.    No friction rub. No gallop.  Pulmonary:     Effort: Pulmonary effort is normal. No respiratory distress.     Breath sounds: Normal breath sounds. No wheezing, rhonchi or rales.  Chest:     Chest wall: No tenderness.  Musculoskeletal:     Cervical back: Normal range of motion and neck supple.  Neurological:     General: No focal deficit present.     Mental Status: She is alert and oriented to person, place, and time.     Motor:  Weakness (Generalized weakness.) present.     Gait: Gait (Is in a wheelchair.  Has to walk with a walker) normal.  Psychiatric:        Behavior: Behavior normal.        Thought Content: Thought content normal.        Judgment: Judgment normal.     Comments: Somewhat down, somber but tries to be positive    Adult ECG Report  Rate: 64 ;  Rhythm: normal sinus rhythm and left axis deviation.  LVH with repolarization changes. ; Otherwise normal axis, intervals and durations.  Narrative Interpretation: Stable  Recent Labs:  04/04/2022 - Microscopic UA: 11-30 WBC.  0-2 RBC.  0-10 epithelial cells.  No casts. => Urine cultures negative 11/28/2021: TC 157, TG 106, HDL 58, LDL 80.  A1c 5.0. 03/25/2022: Hgb 10.8,Cr 1.48, K+ 4.3.   Lab Results  Component Value Date   CHOL 190 10/08/2018   HDL 84 10/08/2018   LDLCALC 92 10/08/2018   TRIG 71 10/08/2018   CHOLHDL 2.3 10/08/2018   Lab Results  Component Value Date   CREATININE 1.31 (H) 01/14/2022   BUN 19 01/14/2022   NA 137 01/14/2022   K 4.0 01/14/2022   CL 105 01/14/2022   CO2 27 01/14/2022      Latest Ref Rng & Units 01/14/2022    4:09 AM 01/13/2022    4:18 AM 01/12/2022    6:55 AM  CBC  WBC 4.0 - 10.5 K/uL 7.4   9.0   12.3    Hemoglobin 12.0 - 15.0 g/dL 10.0   9.9   10.0    Hematocrit 36.0 - 46.0 % 29.1   29.3   29.6    Platelets 150 - 400 K/uL 314   286   307      Lab Results  Component Value Date   HGBA1C 5.0 01/01/2022   Lab Results  Component Value Date   TSH 1.932 08/18/2018    ==================================================  COVID-19 Education: The signs and symptoms of COVID-19 were discussed with the patient and how to seek care for testing (follow up with PCP or arrange E-visit).    I spent a total of 38 minutes with the patient spent in direct patient consultation.  Additional time spent with chart review  / charting (studies, outside notes, etc): 28 min Total Time: 66 min  Current medicines are reviewed at  length with the patient today.  (+/- concerns) none  This visit occurred during the SARS-CoV-2 public health emergency.  Safety protocols were in place, including screening questions prior to the visit, additional usage of staff PPE, and extensive cleaning of exam room while  observing appropriate contact time as indicated for disinfecting solutions.  Notice: This dictation was prepared with Dragon dictation along with smart phrase technology. Any transcriptional errors that result from this process are unintentional and may not be corrected upon review.  Studies Ordered:   Orders Placed This Encounter  Procedures   EKG 12-Lead   No orders of the defined types were placed in this encounter.   Patient Instructions / Medication Changes & Studies & Tests Ordered   Patient Instructions  Medication Instructions:  No changes  *If you need a refill on your cardiac medications before your next appointment, please call your pharmacy*   Lab Work: Not needed    Testing/Procedures:  Not needed  Follow-Up: At [facility], you and your health needs are our priority.  As part of our continuing mission to provide you with exceptional heart care, we have created designated Provider Care Teams.  These Care Teams include your primary Cardiologist (physician) and Advanced Practice Providers (APPs -  Physician Assistants and Nurse Practitioners) who all work together to provide you with the care you need, when you need it.     Your next appointment:   8 to 9 month(s) Jan or Feb 2024   The format for your next appointment:   In Person  Provider:   Glenetta Hew, Harrison    Other Instructions   Keep legs elevated  and wear support  stockings     Tricia Hew, M.D., M.S. Interventional Cardiologist   Pager # [phone] Phone # [phone] [address] [place], [place] [address]   Thank you for choosing Heartcare at [facility]!!

## 2022-04-16 ENCOUNTER — Encounter: Payer: Self-pay | Admitting: Cardiology

## 2022-04-16 ENCOUNTER — Ambulatory Visit (INDEPENDENT_AMBULATORY_CARE_PROVIDER_SITE_OTHER): Payer: Medicare HMO | Admitting: Cardiology

## 2022-04-16 VITALS — BP 164/84 | HR 64 | Ht 61.0 in | Wt 164.8 lb

## 2022-04-16 DIAGNOSIS — I493 Ventricular premature depolarization: Secondary | ICD-10-CM | POA: Diagnosis not present

## 2022-04-16 DIAGNOSIS — G4733 Obstructive sleep apnea (adult) (pediatric): Secondary | ICD-10-CM

## 2022-04-16 DIAGNOSIS — E785 Hyperlipidemia, unspecified: Secondary | ICD-10-CM

## 2022-04-16 DIAGNOSIS — I10 Essential (primary) hypertension: Secondary | ICD-10-CM | POA: Diagnosis not present

## 2022-04-16 DIAGNOSIS — Z86711 Personal history of pulmonary embolism: Secondary | ICD-10-CM

## 2022-04-16 DIAGNOSIS — R002 Palpitations: Secondary | ICD-10-CM | POA: Diagnosis not present

## 2022-04-16 DIAGNOSIS — R0609 Other forms of dyspnea: Secondary | ICD-10-CM

## 2022-04-16 DIAGNOSIS — I700 Atherosclerosis of aorta: Secondary | ICD-10-CM | POA: Diagnosis not present

## 2022-04-16 DIAGNOSIS — I951 Orthostatic hypotension: Secondary | ICD-10-CM

## 2022-04-16 NOTE — Patient Instructions (Addendum)
Medication Instructions:  No changes  *If you need a refill on your cardiac medications before your next appointment, please call your pharmacy*   Lab Work: Not needed    Testing/Procedures:  Not needed  Follow-Up: At [facility], you and your health needs are our priority.  As part of our continuing mission to provide you with exceptional heart care, we have created designated Provider Care Teams.  These Care Teams include your primary Cardiologist (physician) and Advanced Practice Providers (APPs -  Physician Assistants and Nurse Practitioners) who all work together to provide you with the care you need, when you need it.     Your next appointment:   8 to 9 month(s) Jan or Feb 2024   The format for your next appointment:   In Person  Provider:   Glenetta Hew, MD    Other Instructions   Keep legs elevated  and wear support  stockings

## 2022-04-19 ENCOUNTER — Other Ambulatory Visit: Payer: Self-pay | Admitting: Cardiology

## 2022-04-19 NOTE — Telephone Encounter (Signed)
Prescription refill request for Eliquis received. Indication:PE Last office visit:5/23 Scr:1.0 Age: 76 Weight:74.8 kg  Prescription refilled

## 2022-04-22 NOTE — Progress Notes (Incomplete)
Primary Care Provider: Lujean Amel, MD Cardiologist: Glenetta Hew, MD Electrophysiologist: None Magod, Glade Stanford, MD (Gastroenterology) Brand Males, MD (Pulmonary Disease) Hollace Kinnier, MD [facility] Surgery General Surgery)  Clinic Note: No chief complaint on file.  ===================================  ASSESSMENT/PLAN   Problem List Items Addressed This Visit       Cardiology Problems   Essential hypertension (Chronic)   Relevant Orders   EKG 12-Lead (Completed)   Hyperlipidemia LDL goal <100 - Primary (Chronic)   PVC (premature ventricular contraction) (Chronic)   Relevant Orders   EKG 12-Lead (Completed)   Atherosclerosis of aorta (HCC) (Chronic)   Relevant Orders   EKG 12-Lead (Completed)     Other   History of pulmonary embolism (Chronic)   Rapid palpitations (Chronic)   Relevant Orders   EKG 12-Lead (Completed)   Morbid obesity ([place]) (Chronic)   DOE (dyspnea on exertion)   Relevant Orders   EKG 12-Lead (Completed)   Obstructive sleep apnea (Chronic)    ===================================  HPI:    Tricia Harrison is a 76 y.o. female with a PMH notable for Submassive PE (pulmonary hypertension), Bilateral Edema, Essential Hypertension, Palpitations/PVCs, Progressive DOE who presents today for 24-monthfollow-up (postop) at the request of Koirala, Dibas, MD.   I last saw PRylynin March 2022 and then virtual follow-up in April to discuss results of her 3-day (for increased palpitations) monitor and echocardiogram.  BP was well controlled.  Noted still having worsening dyspnea.  Poor stamina and dyspnea with heart rate going up.  Noted dyspnea with PT.  Did not necessarily notice any improvement with statin holiday.  Not able to tolerate CPAP.  PMazie Fenclwas last seen on Nov 09, 2021 by [name] NP noting increased work of breathing with increased physical activity.  Has become sedentary because of her left  knee and back pain.  Able to do ADLs but no formal exercise.  This still noted palpitations with exertion noted occasional lower extremity edema-dependent.  BP elevated-ramipril dose increased to 5 mg.  Discussed support stockings.  He continues to lose weight  Recent Hospitalizations:  01/10/2022: Laparoscopic Cholecystectomy -> moderate adhesions to the gallbladder with some wall thickening consistent with chronic cholecystitis.  Some fatty changes of the liver.  Classic biliary anatomy with no choledocholithiasis, leak or stricture.  Moderate periumbilical and infraumbilical omental adhesions limiting capacity to do single site cholecystectomy. Multiple complaints postoperatively: Worsening difficulty swallowing.  Lightheadedness with walking.  Her legs "buckled when standing" -found to be orthostatic.  BP meds reduced.  Rehydrated, and orthostasis improved.  Coreg, Cardizem and ramipril dose was decreased. Family was not satisfied with SNF rehab options available and therefore chose for home health PT OT. Difficulty with food sticking/swallowing.  Speech therapy evaluation showed [name] pharyngeal dysphagia with modified barium swallow.  Discharged on dysphagia 3 diet. [facility] => she told them that she was having difficulty swallowing with slurred speech.  Still having difficulty swallowing. Flexible laryngoscopy revealed patent anterior nasal cavity with minimal crusting.  No discharge or infection.  Normal base of tongue and supraglottis.  Normal vocal cord mobility without vocal cord nodule, mass polyp or tumor.  Normal hypopharynx without mass pooling of secretions or aspirations. = Recommended speech path versus GI evaluation.  Seen by Dr. GJohney Maineon March 13: Remained in a wheelchair.  She was disappointed about her slow recovery.  Still having issues with constipation.  Also noted swallowing difficulty.  Starting to use boost shakes to supplement diet as her  appetite normalized. => Referred to [facility] GI  Reviewed  CV studies:    The following studies were reviewed today: (if available, images/films reviewed: From Epic Chart or Care Everywhere) No recent studies:  Interval History:   Tricia Harrison returns here today for follow-up very weak and debilitated following her surgery.  She had a long protracted recovery and is weaker than she had been before.  Now having to walk with a walker mostly because her back is limiting her.  She is doing 1 day a week rehab now.  Slow progression postoperatively.  She still complains about long hospitalization. She is quite deconditioned and therefore does have exertional dyspnea but no real PND or orthopnea.  She has ankle edema at the end of the day, mostly because she is not moving much.    She has noted that her blood pressures have been up and down quite a bit over the last several months.  Denied any headaches or dizziness, wooziness associated with hypertension.  Blood pressure medications have been adjusted in the hospital with her carvedilol dose and diltiazem both reduced.  She has PRN clonidine for high blood pressures but has not required any.  CV Review of Symptoms (Summary) Cardiovascular ROS: positive for - dyspnea on exertion and rare occasional palpitations.  Nothing prolonged.  Exercise intolerance and deconditioning.  Dizziness but not related to palpitations.  Labile blood pressures without associated headaches or blurred vision. negative for - chest pain, irregular heartbeat, orthopnea, paroxysmal nocturnal dyspnea, rapid heart rate, shortness of breath, or syncope/near syncope or TIA/amaurosis fugax, claudication   REVIEWED OF SYSTEMS   Review of Systems  Constitutional:  Positive for malaise/fatigue (Still has low energy level.  Slowly progressing postoperatively.).  HENT:  Positive for congestion.   Respiratory:  Positive for shortness of breath (Deconditioned). Negative for cough  and wheezing.   Cardiovascular:  Positive for leg swelling (She notes bilateral ankle swelling.).       Labile blood pressures, go up and down.  Gastrointestinal:  Positive for constipation. Negative for blood in stool and melena.       Significant dyspepsia/dysphagia postoperatively.  For a while she was unable to tolerate solid foods.  Genitourinary:  Negative for dysuria, frequency and hematuria (Microscopic but not visible).  Musculoskeletal:  Positive for back pain (Significantly limits walking.). Negative for falls (Careful to use walker.) and myalgias.  Neurological:  Positive for dizziness and weakness (Generalized, mostly legs).  Psychiatric/Behavioral:  Positive for memory loss. Negative for depression (At a minimum dysthymia). The patient is nervous/anxious and has insomnia.    I have reviewed and (if needed) personally updated the patient's problem list, medications, allergies, past medical and surgical history, social and family history.   PAST MEDICAL HISTORY   Past Medical History:  Diagnosis Date  . Anemia    as a child  . Asthma    as a child  . Chronic back pain    spinal stenosis and buldging disc;scoliosis  . Chronic constipation    occasionally takes something OTC  . Chronic kidney disease   . Diabetes mellitus without complication ([place])    per pt "Pre" for 20+yrs  . Diverticulosis   . DVT (deep venous thrombosis) ([place])   . Gallstones    has known about this for 3-52yr  . GERD (gastroesophageal reflux disease)    takes Omeprazole daily  . H/O hiatal hernia   . Heart murmur   .  Hemorrhoids   . History of blood transfusion    no abnormal reaction noted  . History of bronchitis    states its been a long time ago  . History of gout   . HLD (hyperlipidemia)    takes Fenofibrate daily  . HTN (hypertension)    takes Diltiazem and Ramipril daily  . Hypothyroidism    takes Synthroid daily  . Left knee DJD   . Nocturia   . Palpitations    started in 2013  and only occasionally;last time noticed about 2-3wks ago and after drinking caffeine  . PONV (postoperative nausea and vomiting)   . 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    PAST SURGICAL HISTORY   Past Surgical History:  Procedure Laterality Date  . 3-day EVENT MONITOR  01/2019   Mostly normal monitor.  Predominantly sinus rhythm (rate range 48-105 bpm, average 67 bpm) 9 short atrial runs (fastest = 13 beats rate 135 bpm, longest ~17 seconds-101 bpm) -> not noted on symptom log.  No sustained arrhythmias (tachycardia or bradycardia), or pauses..  Symptoms are noted with PVCs.  . CHOLECYSTECTOMY N/A 01/10/2022   Procedure: LAPAROSCOPIC CHOLECYSTECTOMY WITH INTRAOPERATIVE CHOLANGIOGRAM;  Surgeon: Michael Boston, MD;  Location: [facility];  Service: General;  Laterality: N/A;  . COLONOSCOPY    . CT ANGIOGRAM OF CHEST - PE PROTOCOL  07/2018   Bilateral nonocclusive pulmonary emboli evidence of right heart strain consistent with "submassive "/intermediate PE.  -->  Code PE called.  Marland Kitchen DILATION AND CURETTAGE OF UTERUS     multiple about 6-7 times  . ESOPHAGOGASTRODUODENOSCOPY    . NM VQ LUNG SCAN ([place] HX)  11/23/2018   Low probability for PE  . RIGHT/LEFT HEART CATH AND CORONARY ANGIOGRAPHY N/A 08/19/2018   Procedure: RIGHT/LEFT HEART CATH AND CORONARY ANGIOGRAPHY;  Surgeon: Jettie Booze, MD;  Location: [facility];  Service: Cardiovascular:: Nonobstructive CAD.  Mild pulmonary pretension.  Mean PA pressure 26 mmHg with PA P 48/20 mmHg.  Normal LVEDP.  . THYROID SURGERY Right 2010  . TOTAL HIP ARTHROPLASTY Left 2005  . TOTAL KNEE ARTHROPLASTY Left 02/21/2014   Procedure: TOTAL KNEE ARTHROPLASTY;  Surgeon: Lorn Junes, MD;  Location: [place];  Service: Orthopedics;  Laterality: Left;  . TRANSTHORACIC ECHOCARDIOGRAM  07/2018    (In setting of acute PE) mild LVH.   EF 55 to 60%.  GR 1 DD.  Aortic sclerosis but no stenosis.  Mild regurgitation.  Mild RA dilation.  Moderate LA dilation.  Moderate tricuspid regurgitation with severe primary hypertension estimated PA pressure 71 mmHg.  RV poorly visualized  . TRANSTHORACIC ECHOCARDIOGRAM  10/2018 - 11/2019   a) EF 60-65%.  GR 1 DD.  Focal basal LVH. Mod AI/ no AS. Mild LA dilation.  Normal RV size and fxn.  Mod PI w/ mild TR.  Peak PAP ~ 38 mmHg; b) Normal EF 60 to 65%.  Moderate LVH-GR 1 DD.  R WMA.  Mild AS w/ trivial AI.  Grossly normal PA, RV and RA size.  Normal RV function.  Normal PA pressures.  . TRANSTHORACIC ECHOCARDIOGRAM  01/2021    EF 55%.  Normal LV function.  Mild LVH.  GR 1 DD.  Normal PA pressures.  Severe LA dilation.  (Consistent with more significant diastolic dysfunction).  Mild-possibly mild to moderate aortic insufficiency.  Borderline elevated  RAP.  Trivial pericardial effusion.  (Stable)  . VAGINAL HYSTERECTOMY  1979   Zio patch March 2022: Predominant rhythm is normal sinus rhythm: Minimum rate 48 bpm, maximum 105 bpm with an average of 67 bpm. Rare PACs with occasional ~1% PVCs. No real couplets or triplets. Very short burst of trigeminy. 9 Atrial Runs: Fastest was 13 beats at a rate of 135 bpm, longest was just under 17 seconds with a rate of 101 beats. -> These were not noted on symptoms Symptoms noted with sinus rhythm and PVCs. No abnormal tachycardic or bradycardic rhythms. No pauses.  Immunization History  Administered Date(s) Administered  . Influenza, High Dose Seasonal PF 08/20/2018  . Moderna Covid-19 Vaccine Bivalent Booster 58yr & up 08/30/2021    MEDICATIONS/ALLERGIES   Current Meds  Medication Sig  . acetaminophen (TYLENOL) 500 MG tablet Take 1,000 mg by mouth every 6 (six) hours as needed for mild pain.  .Marland Kitchenalbuterol (VENTOLIN HFA) 108 (90 Base) MCG/ACT inhaler Inhale 2 puffs into the lungs every 6 (six) hours as needed.  .Marland KitchenazaTHIOprine (IMURAN) 50 MG tablet Take  50 mg by mouth daily.  . bimatoprost (LUMIGAN) 0.01 % SOLN Place 1 drop into both eyes at bedtime.  . carvedilol (COREG) 3.125 MG tablet Take 1 tablet (3.125 mg total) by mouth 2 (two) times daily with a meal.  . cloNIDine (CATAPRES) 0.1 MG tablet Take 1 tablet (0.1 mg total) by mouth 2 (two) times daily as needed (for blood pressure above 180 when she is SEATEd--not lying down).  . COVID-19 mRNA vaccine, Moderna, 100 MCG/0.5ML injection Inject into the muscle.  . diltiazem (CARDIZEM CD) 180 MG 24 hr capsule Take 1 capsule (180 mg total) by mouth daily.  . dorzolamide-timolol (COSOPT) 22.3-6.8 MG/ML ophthalmic solution Place 1 drop into both eyes 2 times daily.  . fenofibrate (TRICOR) 145 MG tablet Take 145 mg by mouth daily.  .Marland KitchenHUMIRA PEN 40 MG/0.4ML PNKT Inject 40 mg into the skin every 14 (fourteen) days.  .Marland Kitchenketorolac (ACULAR) 0.5 % ophthalmic solution Place 1 drop into both eyes 4 (four) times daily. Verified correct strength per Ophthalmalogist progress notes  . levothyroxine (SYNTHROID, LEVOTHROID) 50 MCG tablet Take 50 mcg by mouth daily before breakfast.  . omeprazole (PRILOSEC) 20 MG capsule Take 20 mg by mouth daily.  . ondansetron (ZOFRAN) 4 MG tablet Take 1 tablet (4 mg total) by mouth every 8 (eight) hours as needed for nausea.  . polyethylene glycol (MIRALAX / GLYCOLAX) 17 g packet Take 17 g by mouth daily as needed for mild constipation.  . rosuvastatin (CRESTOR) 20 MG tablet TAKE 1 TABLET BY MOUTH  DAILY AT 6 PM.  . traMADol (ULTRAM) 50 MG tablet Take 1-2 tablets (50-100 mg total) by mouth every 6 (six) hours as needed for moderate pain or severe pain.  . [DISCONTINUED] apixaban (ELIQUIS) 5 MG TABS tablet TAKE 1 TABLET BY MOUTH  TWICE DAILY    Allergies  Allergen Reactions  . Atorvastatin Other (See Comments)    Muscle pain  . Codeine Nausea And Vomiting  . Morphine And Related     B/p drops     SOCIAL HISTORY/FAMILY HISTORY   Reviewed in Epic:  Pertinent findings:   Social History   Tobacco Use  . Smoking status: Former    Packs/day: 0.50    Years: 20.00    Pack years: 10.00    Types: Cigarettes    Start date: 09/04/1964    Quit date: 11/25/1993  Years since quitting: 28.4  . Smokeless tobacco: Never  . Tobacco comments:    quit smoking in 1995  Vaping Use  . Vaping Use: Never used  Substance Use Topics  . Alcohol use: Yes    Comment: Socially.  . Drug use: No   Social History   Social History Narrative   Pt lives w/ sister and nephew.    OBJCTIVE -PE, EKG, labs   Wt Readings from Last 3 Encounters:  04/16/22 164 lb 12.8 oz (74.8 kg)  01/10/22 175 lb 11.3 oz (79.7 kg)  01/01/22 176 lb 4 oz (79.9 kg)    Physical Exam: BP (!) 164/84   Pulse 64   Ht '5\' 1"'$  (1.549 m)   Wt 164 lb 12.8 oz (74.8 kg)   SpO2 100%   BMI 31.14 kg/m  Physical Exam Vitals reviewed.  Constitutional:      Appearance: She is obese. She is ill-appearing (Chronically ill-appearing.  Sitting in a wheelchair.  Profoundly weak.).     Comments: Notable weight loss, unintentional during illness.  HENT:     Head: Normocephalic and atraumatic.  Neck:     Vascular: No carotid bruit.  Cardiovascular:     Rate and Rhythm: Normal rate and regular rhythm. No extrasystoles are present.    Pulses: Decreased pulses (Diminished, palpable pedal pulses).     Heart sounds: S1 normal and S2 normal. Heart sounds are distant. Murmur (1/6 SEM at RUSB-neck) heard.    No friction rub. No gallop.  Pulmonary:     Effort: Pulmonary effort is normal. No respiratory distress.     Breath sounds: Normal breath sounds. No wheezing, rhonchi or rales.  Chest:     Chest wall: No tenderness.  Musculoskeletal:     Cervical back: Normal range of motion and neck supple.  Neurological:     General: No focal deficit present.     Mental Status: She is alert and oriented to person, place, and time.     Motor: Weakness (Generalized weakness.) present.     Gait: Gait (Is in a wheelchair.   Has to walk with a walker) normal.  Psychiatric:        Behavior: Behavior normal.        Thought Content: Thought content normal.        Judgment: Judgment normal.     Comments: Somewhat down, somber but tries to be positive    Adult ECG Report  Rate: 64 ;  Rhythm: normal sinus rhythm and left axis deviation.  LVH with repolarization changes. ; Otherwise normal axis, intervals and durations.  Narrative Interpretation: Stable  Recent Labs:  04/04/2022 - Microscopic UA: 11-30 WBC.  0-2 RBC.  0-10 epithelial cells.  No casts. => Urine cultures negative 11/28/2021: TC 157, TG 106, HDL 58, LDL 80.  A1c 5.0. 03/25/2022: Hgb 10.8,Cr 1.48, K+ 4.3.   Lab Results  Component Value Date   CHOL 190 10/08/2018   HDL 84 10/08/2018   LDLCALC 92 10/08/2018   TRIG 71 10/08/2018   CHOLHDL 2.3 10/08/2018   Lab Results  Component Value Date   CREATININE 1.31 (H) 01/14/2022   BUN 19 01/14/2022   NA 137 01/14/2022   K 4.0 01/14/2022   CL 105 01/14/2022   CO2 27 01/14/2022      Latest Ref Rng & Units 01/14/2022    4:09 AM 01/13/2022    4:18 AM 01/12/2022    6:55 AM  CBC  WBC 4.0 - 10.5 K/uL 7.4  9.0   12.3    Hemoglobin 12.0 - 15.0 g/dL 10.0   9.9   10.0    Hematocrit 36.0 - 46.0 % 29.1   29.3   29.6    Platelets 150 - 400 K/uL 314   286   307      Lab Results  Component Value Date   HGBA1C 5.0 01/01/2022   Lab Results  Component Value Date   TSH 1.932 08/18/2018    ==================================================  COVID-19 Education: The signs and symptoms of COVID-19 were discussed with the patient and how to seek care for testing (follow up with PCP or arrange E-visit).    I spent a total of 38 minutes with the patient spent in direct patient consultation.  Additional time spent with chart review  / charting (studies, outside notes, etc): 28 min Total Time: 66 min  Current medicines are reviewed at length with the patient today.  (+/- concerns) none  This visit occurred during  the SARS-CoV-2 public health emergency.  Safety protocols were in place, including screening questions prior to the visit, additional usage of staff PPE, and extensive cleaning of exam room while observing appropriate contact time as indicated for disinfecting solutions.  Notice: This dictation was prepared with Dragon dictation along with smart phrase technology. Any transcriptional errors that result from this process are unintentional and may not be corrected upon review.  Studies Ordered:   Orders Placed This Encounter  Procedures  . EKG 12-Lead   No orders of the defined types were placed in this encounter.   Patient Instructions / Medication Changes & Studies & Tests Ordered   Patient Instructions  Medication Instructions:  No changes  *If you need a refill on your cardiac medications before your next appointment, please call your pharmacy*   Lab Work: Not needed    Testing/Procedures:  Not needed  Follow-Up: At [facility], you and your health needs are our priority.  As part of our continuing mission to provide you with exceptional heart care, we have created designated Provider Care Teams.  These Care Teams include your primary Cardiologist (physician) and Advanced Practice Providers (APPs -  Physician Assistants and Nurse Practitioners) who all work together to provide you with the care you need, when you need it.     Your next appointment:   8 to 9 month(s) Jan or Feb 2024   The format for your next appointment:   In Person  Provider:   Glenetta Hew, MD    Other Instructions   Keep legs elevated  and wear support  stockings     Glenetta Hew, M.D., M.S. Interventional Cardiologist   Pager # [phone] Phone # [phone] [address] [place],  [address]   Thank you for choosing Heartcare at [facility]!!

## 2022-04-23 ENCOUNTER — Encounter: Payer: Self-pay | Admitting: Cardiology

## 2022-04-23 NOTE — Assessment & Plan Note (Signed)
Blood pressure is high today, but she says has been up and down.  At this point, with her having dizziness and generalized weakness, would prefer to allow for permissive hypertension.  She does have clonidine which can be used for systolic pressures greater than 180 mmHg.  Otherwise we will hold off on further aggressive management for now.    Continue low-dose of diltiazem and carvedilol.

## 2022-04-23 NOTE — Assessment & Plan Note (Signed)
Monitoring blood pressure and lipids.

## 2022-04-23 NOTE — Assessment & Plan Note (Signed)
Most recent labs from [place] showed LDL of 80.  I imagine she should be due for labs to be checked by her PCP sometime over the summertime.  She did not notice any improvement in her fatigue and myalgias with statin holiday.  Now is back on rosuvastatin.  Probably need to allow her to heal more fully from her surgery.  She is on fenofibrate and rosuvastatin.

## 2022-04-23 NOTE — Assessment & Plan Note (Addendum)
Normal echocardiogram and stress evaluation.  Benign findings.  Not really much of a problem for her right now.  Heart rate is 64 bpm on current dose of carvedilol and diltiazem.  We will hold off on titrating medications any further.  Would like to allow for some mild permissive hypertension.

## 2022-04-23 NOTE — Assessment & Plan Note (Signed)
She had orthostatic hypotension in the hospital postoperatively.  Probably was dehydrated.  Medications were reduced diltiazem and carvedilol.  When blood pressures became higher, they added PRN clonidine.  She has not used any.  Recommended continued hydration, and wearing support stockings when possible.  Also increase foot elevation.

## 2022-04-23 NOTE — Assessment & Plan Note (Signed)
Back on Eliquis.  No major bleeding issues.  Defer management of anticoagulation to PCP.

## 2022-04-23 NOTE — Assessment & Plan Note (Signed)
Stable on combination of carvedilol and diltiazem.  Seems to be doing okay despite reduced doses.

## 2022-04-23 NOTE — Assessment & Plan Note (Signed)
She lost weight postoperatively.  Hopefully, she will be able to keep the weight off.

## 2022-04-25 ENCOUNTER — Ambulatory Visit
Admission: RE | Admit: 2022-04-25 | Discharge: 2022-04-25 | Disposition: A | Discharge status: Home / Self Care | Payer: Medicare HMO | Source: Ambulatory Visit | Attending: Family Medicine | Admitting: Family Medicine

## 2022-04-25 DIAGNOSIS — Z1231 Encounter for screening mammogram for malignant neoplasm of breast: Secondary | ICD-10-CM

## 2022-05-30 ENCOUNTER — Telehealth: Payer: Self-pay

## 2022-05-30 NOTE — Telephone Encounter (Signed)
   Patient Name: Tricia Harrison  DOB: [birth year] MRN: [id number]  Primary Cardiologist: Glenetta Hew, MD  Chart reviewed as part of pre-operative protocol coverage.   Patient is on Eliquis for history of PE.  Eliquis is managed by patient's PCP, not cardiology. Therefore, recommendations on holding Eliquis prior to surgery should come from managing provider.  I will route this recommendation to requesting party and remove this request from the  pre-op pool.  Please call with any questions.  Lenna Sciara, NP 05/30/2022, 4:39 PM

## 2022-05-30 NOTE — Telephone Encounter (Signed)
   Pre-operative Risk Assessment    Patient Name: Tricia Harrison  DOB: [birth year] MRN: [id number]      Request for Surgical Clearance    Procedure:   Revision Left Total Knee Arthroplasty  Date of Surgery:  Clearance TBD                                 Surgeon:  Charlies Constable, MD Surgeon's Group or Practice Name:  [facility] Phone number:  [phone] ext [phone] Fax number:  [phone]   Type of Clearance Requested:   - Pharmacy:  Hold Apixaban (Eliquis)     Type of Anesthesia:  Spinal   Additional requests/questions:    Signed, Wonda Horner   05/30/2022, 3:19 PM

## 2022-07-06 ENCOUNTER — Other Ambulatory Visit: Payer: Self-pay | Admitting: Cardiology

## 2022-09-24 ENCOUNTER — Telehealth: Payer: Self-pay | Admitting: Neurology

## 2022-09-24 ENCOUNTER — Ambulatory Visit: Payer: Medicare HMO | Admitting: Neurology

## 2022-09-24 ENCOUNTER — Encounter: Payer: Self-pay | Admitting: Neurology

## 2022-09-24 VITALS — BP 171/80 | HR 66 | Ht 61.0 in | Wt 159.2 lb

## 2022-09-24 DIAGNOSIS — R269 Unspecified abnormalities of gait and mobility: Secondary | ICD-10-CM

## 2022-09-24 DIAGNOSIS — R251 Tremor, unspecified: Secondary | ICD-10-CM

## 2022-09-24 DIAGNOSIS — Z9181 History of falling: Secondary | ICD-10-CM

## 2022-09-24 NOTE — Patient Instructions (Signed)
You have an intermittent tremor, no obvious tremor on exam today.  I do not see any obvious/telltale signs of parkinson's disease or what we call parkinsonism.  For your tremor, I would not recommend any new medications at this time, for fear of side effects or interactions with your other medications.   Please remember, that any kind of tremor may be exacerbated by anxiety, anger, nervousness, excitement, dehydration, sleep deprivation, thyroid dysfunction, by caffeine, and low blood sugar values or blood sugar fluctuations. Some medications can exacerbate tremors, this includes certain asthma or COPD medications and certain antidepressants.  We will do a brain scan, called MRI and call you with the test results. We will have to schedule you for this on a separate date. This test requires authorization from your insurance, and we will take care of the insurance process.

## 2022-09-24 NOTE — Progress Notes (Signed)
Subjective:    Patient ID: Tricia Harrison is a 76 y.o. female.  HPI    Star Age, MD, PhD [facility] [address] [place], [place] [address]  Dear Dr. Dorthy Cooler,   I saw your patient, Tricia Harrison, upon your kind request in my neurologic clinic today for initial consultation of her tremors.  The patient is accompanied by her daughter in law today.  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  Symptoms have been ongoing for 6 to 10 months but worse since her gallbladder surgery and February 2023.  She also reports that she has had difficulty walking for months, she feels weaker on the left side versus right, of note, she had hip replacement on the left several years ago and knee replacement on the left about 7 years ago.  She has had multiple rounds of physical therapy over the years.  She cannot have surgery for her lumbar spinal stenosis, she also has bone-on-bone in the right knee.  She has fallen several times over the past several years.  She lives with her sister.  She has 1 son, no family history of tremors of Parkinson's disease.  She hydrates well with water, she has stopped drinking any caffeine about 6 months ago, drinks 1 cup of decaf coffee in the mornings, no alcohol.  It is worse when she is nervous or stressed.  She sleeps fairly, has  significant nocturia about 3 times a night and does not use her CPAP.  Of note, she is on several medications including high risk medications, such as Imuran, Humira.  She is also on Eliquis.  She is currently on a beta-blocker as well, carvedilol 3.125 mg twice daily.  I reviewed your office note from 08/05/2022.  She had CT without contrast on 01/11/2022 for indication of altered mental status and cholecystectomy yesterday, I reviewed the results: IMPRESSION: 1. No acute intracranial abnormality. 2. Mild chronic small vessel ischemic disease. 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 3. Enlarged partially empty sella, typically incidental but can be seen in the setting of idiopathic intracranial hypertension.   She had laparoscopic cholecystectomy on 01/10/2022, hospitalization was complicated by orthostatic hypotension, and dysphagia. She had blood work through your office on 06/18/2022 and I reviewed the results: TSH normal at 1.92, CMP showed BUN elevated at 32, creatinine elevated at 1.38, alk phos 25, AST 18, ALT 9, CBC with differential showed hemoglobin below normal at 10.8, hematocrit below normal at 31.5, MCV elevated at 99.1, MCH elevated at 34, platelets normal at 383.   Her Past Medical History Is Significant For: Past Medical History:  Diagnosis Date   Anemia    as a child   Asthma    as a child   Chronic back pain    spinal stenosis and buldging disc;scoliosis   Chronic constipation    occasionally takes something OTC   Chronic kidney disease    Diabetes mellitus without complication ([place])  per pt "Pre" for 20+yrs   Diverticulosis    DVT (deep venous thrombosis) ([facility])    Gallstones    has known about this for 3-76yr   GERD (gastroesophageal reflux disease)    takes Omeprazole daily   H/O hiatal hernia    Heart murmur    Hemorrhoids    History of blood transfusion    no abnormal reaction noted   History of bronchitis    states its been a long time ago    History of gout    HLD (hyperlipidemia)    takes Fenofibrate daily   HTN (hypertension)    takes Diltiazem and Ramipril daily   Hypothyroidism    takes Synthroid daily   Left knee DJD    Nocturia    Palpitations    started in 2013 and only occasionally;last time noticed about 2-3wks ago and after drinking caffeine   PONV (postoperative nausea and vomiting)    [place]ulmonary [place]ale [place] [place]   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    Her Past Surgical History Is Significant For: Past Surgical History:  Procedure Laterality Date   3-day EVENT MONITOR  01/2019   Mostly normal monitor.  Predominantly sinus rhythm (rate range 48-105 bpm, average 67 bpm) 9 short atrial runs (fastest = 13 beats rate 135 bpm, longest ~17 seconds-101 bpm) -> not noted on symptom log.  No sustained arrhythmias (tachycardia or bradycardia), or pauses..  Symptoms are noted with PVCs.   CHOLECYSTECTOMY N/A 01/10/2022   Procedure: LAPAROSCOPIC CHOLECYSTECTOMY WITH INTRAOPERATIVE CHOLANGIOGRAM;  Surgeon: GMichael [place] MD;  Location: [facility];  Service: General;  Laterality: N/A;   COLONOSCOPY     CT ANGIOGRAM OF CHEST - PE PROTOCOL  07/2018   Bilateral nonocclusive pulmonary emboli evidence of right heart strain consistent with "submassive "/intermediate PE.  -->  Code PE called.   DILATION AND CURETTAGE OF UTERUS     multiple about 6-7 times   ESOPHAGOGASTRODUODENOSCOPY     NM VQ LUNG SCAN ([facility] HX)  11/23/2018   Low probability for PE   RIGHT/LEFT HEART CATH AND CORONARY ANGIOGRAPHY N/A 08/19/2018   Procedure: RIGHT/LEFT HEART CATH AND CORONARY ANGIOGRAPHY;  Surgeon: VJettie Booze MD;  [facility];  Service: Cardiovascular:: Nonobstructive CAD.  Mild pulmonary pretension.  Mean PA pressure 26 mmHg with PA P 48/20 mmHg.  Normal LVEDP.   THYROID SURGERY Right 2010   TOTAL HIP ARTHROPLASTY Left 2005    TOTAL KNEE ARTHROPLASTY Left 02/21/2014   Procedure: TOTAL KNEE ARTHROPLASTY;  Surgeon: RLorn Junes MD;  [facility]  Service: Orthopedics;  Laterality: Left;   TRANSTHORACIC ECHOCARDIOGRAM  07/2018    (In setting of acute PE) mild LVH.  EF 55 to 60%.  GR 1 DD.  Aortic sclerosis but no stenosis.  Mild regurgitation.  Mild RA dilation.  Moderate LA dilation.  Moderate tricuspid regurgitation with severe primary hypertension estimated PA pressure 71 mmHg.  RV poorly visualized   TRANSTHORACIC ECHOCARDIOGRAM  10/2018 - 11/2019   a) EF 60-65%.  GR 1 DD.  Focal basal LVH. Mod AI/ no AS. Mild LA dilation.  Normal RV size and fxn.  Mod PI w/ mild TR.  Peak PAP ~ 38 mmHg; b) Normal EF 60 to 65%.  Moderate LVH-GR 1 DD.  R WMA.  Mild AS w/ trivial AI.  Grossly normal PA, RV and RA  size.  Normal RV function.  Normal PA pressures.   TRANSTHORACIC ECHOCARDIOGRAM  01/2021    EF 55%.  Normal LV function.  Mild LVH.  GR 1 DD.  Normal PA pressures.  Severe LA dilation.  (Consistent with more significant diastolic dysfunction).  Mild-possibly mild to moderate aortic insufficiency.  Borderline elevated RAP.  Trivial pericardial effusion.  (Stable)   VAGINAL HYSTERECTOMY  1979    Her Family History Is Significant For: Family History  Problem Relation Age of Onset   Uterine cancer Mother    Heart disease Mother    Hypertension Mother    Diabetes Mother    Cirrhosis Father    Pneumonia Father    Lymphoma Sister        ,non hodgkin    Diabetes Sister    Breast cancer Neg Hx     Her Social History Is Significant For: Social History   Socioeconomic History   Marital status: Single    Spouse name: Not on file   Number of children: Not on file   Years of education: Not on file   Highest education level: Not on file  Occupational History   Occupation: Retired  Tobacco Use   Smoking status: Former    Packs/day: 0.50    Years: 20.00    Total pack years: 10.00    Types: Cigarettes    Start date:  09/04/1964    Quit date: 11/25/1993    Years since quitting: 28.8   Smokeless tobacco: Never   Tobacco comments:    quit smoking in 1995  Vaping Use   Vaping Use: Never used  Substance and Sexual Activity   Alcohol use: Yes    Comment: Socially.   Drug use: No   Sexual activity: Not on file  Other Topics Concern   Not on file  Social History Narrative   Pt lives w/ sister.   Social Determinants of Health   Financial Resource Strain: Not on file  Food Insecurity: Not on file  Transportation Needs: Not on file  Physical Activity: Not on file  Stress: Not on file  Social Connections: Not on file    Her Allergies Are:  Allergies  Allergen Reactions   Atorvastatin Other (See Comments)    Muscle pain   Codeine Nausea And Vomiting   Morphine And Related     B/p drops   :   Her Current Medications Are:  Outpatient Encounter Medications as of 09/24/2022  Medication Sig   acetaminophen (TYLENOL) 500 MG tablet Take 1,000 mg by mouth every 6 (six) hours as needed for mild pain.   albuterol (VENTOLIN HFA) 108 (90 Base) MCG/ACT inhaler Inhale 2 puffs into the lungs every 6 (six) hours as needed.   azaTHIOprine (IMURAN) 50 MG tablet Take 50 mg by mouth daily.   bimatoprost (LUMIGAN) 0.01 % SOLN Place 1 drop into both eyes at bedtime.   carvedilol (COREG) 3.125 MG tablet Take 1 tablet (3.125 mg total) by mouth 2 (two) times daily with a meal.   cloNIDine (CATAPRES) 0.1 MG tablet Take 1 tablet (0.1 mg total) by mouth 2 (two) times daily as needed (for blood pressure above 180 when she is SEATEd--not lying down).   COVID-19 mRNA vaccine, Moderna, 100 MCG/0.5ML injection Inject into the muscle.   diltiazem (CARDIZEM CD) 180 MG 24 hr capsule Take 1 capsule (180 mg total) by mouth daily.   dorzolamide-timolol (COSOPT) 22.3-6.8 MG/ML ophthalmic solution Place 1 drop into both eyes 2 times daily.  ELIQUIS 5 MG TABS tablet TAKE 1 TABLET BY MOUTH TWICE  DAILY   fenofibrate (TRICOR) 145 MG  tablet Take 145 mg by mouth daily.   HUMIRA PEN 40 MG/0.4ML PNKT Inject 40 mg into the skin every 14 (fourteen) days.   ketorolac (ACULAR) 0.5 % ophthalmic solution Place 1 drop into both eyes 4 (four) times daily. Verified correct strength per Ophthalmalogist progress notes   levothyroxine (SYNTHROID, LEVOTHROID) 50 MCG tablet Take 50 mcg by mouth daily before breakfast.   omeprazole (PRILOSEC) 20 MG capsule Take 20 mg by mouth daily.   ondansetron (ZOFRAN) 4 MG tablet Take 1 tablet (4 mg total) by mouth every 8 (eight) hours as needed for nausea.   polyethylene glycol (MIRALAX / GLYCOLAX) 17 g packet Take 17 g by mouth daily as needed for mild constipation.   rosuvastatin (CRESTOR) 20 MG tablet TAKE 1 TABLET BY MOUTH  DAILY AT 6 PM.   traMADol (ULTRAM) 50 MG tablet Take 1-2 tablets (50-100 mg total) by mouth every 6 (six) hours as needed for moderate pain or severe pain.   No facility-administered encounter medications on file as of 09/24/2022.  :   Review of Systems:  Out of a complete 14 point review of systems, all are reviewed and negative with the exception of these symptoms as listed below:   Review of Systems  Neurological:        L sided weakness after gall bladder surgery. In wheelchair, noted L sided weakness.  Worsening hand tremors.     Objective:  Neurological Exam  Physical Exam Physical Examination:   Vitals:   09/24/22 0816  BP: (!) 171/80  Pulse: 66    General Examination: The patient is a very pleasant 76 y.o. female in no acute distress. She appears well-developed and well-nourished and well groomed.   HEENT: Normocephalic, atraumatic, pupils are equal, round and reactive to light, extraocular tracking is mildly impaired, mild facial puffiness noted, no nuchal rigidity, speech is clear without dysarthria or voice tremor, no lip, neck or jaw tremor.  Hearing is grossly intact, no carotid bruits on auscultation.  Tongue protrudes centrally and palate elevates  symmetrically.  Mild mouth dryness.  Chest: Clear to auscultation without wheezing, rhonchi or crackles noted.  Heart: S1+S2+0, regular and normal without murmurs, rubs or gallops noted.   Abdomen: Soft, non-tender and non-distended.  Extremities: There is puffiness around both ankles.    Skin: Warm and dry without trophic changes noted.   Musculoskeletal: exam reveals scoliosis, significant forward curvature also of the mid and upper back, she walks very limited, did not bring her walker, can transfer from her wheelchair to a chair without assistance but has significant limp, significant decreased range of motion in both knees and both hips, left worse than right.   Neurologically:  Mental status: The patient is awake, alert and oriented in all 4 spheres. Her immediate and remote memory, attention, language skills and fund of knowledge are appropriate. There is no evidence of aphasia, agnosia, apraxia or anomia. Speech is clear with normal prosody and enunciation. Thought process is linear. Mood is normal and affect is normal.  Cranial nerves II - XII are as described above under HEENT exam.  Motor exam: thin bulk and global strength about 4 out of 5, slightly weaker on the left, no obvious resting tremor, no significant postural or action tremor.   No telltale cogwheeling, mild gegenhalten in both upper extremities.   Fine motor skills and coordination:.  With finger taps  and hand movements and foot taps.  No lateralization noted.    On 09/24/2022: On Archimedes spiral drawing she has trembling with the left hand, mild insecurity but no trembling with the right hand, handwriting with the right hand is legible, not micrographic, very slightly tremulous.  Cerebellar testing: No dysmetria or intention tremor. There is no truncal or gait ataxia.   Reflexes: 1+ in the upper extremities and absent in the lower extremities.    Sensory exam: intact to light touch in the upper and lower  extremities.   Gait, station and balance: She stands with significant difficulty and requires mild assistance but did not bring her walker.  She is in a wheelchair.  She transfers with mild assistance to chair and back.  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  She has multiple other complicating and confounding factors including orthopedic issues with limitation in range of motion, significant back pain, posture changes, gait disturbance, weakness and fine motor dyscontrol.  History and examination are not in keeping with essential tremor, she does not have any obvious and convincing signs of parkinsonism.  She has no obvious tremor on examination today.  She is largely reassured but also advised that there are multiple contributing factors that could affect her gait, her balance, her mobility in general and her fine motor control.  Unfortunately, her history is complicated, she is already on a beta-blocker that is usually used for symptomatic treatment for tremors, she is also on Eliquis which would not allow [place] to put her on primidone but I would not  recommend primidone for her to begin with given her balance issues and her falls.  She has impaired kidney function.  She is advised to continue to hydrate well, avoid caffeine as she is, avoid alcohol as she is.  She is advised to use her CPAP at all times as better sleep may affect her tremors positively.  We will go ahead and proceed with a brain MRI without contrast to rule out any obvious structural cause of her symptoms, a CT scan of her head earlier this year did not show any acute or focal findings.  At this juncture, she is advised to follow-up with you regularly and her other specialists, we will call her with her MRI results and follow-up in this clinic as needed.  I answered all her questions today and the patient and her daughter-in-law were in agreement.  This was an extended visit of over 1 hour, due to extended chart review.   Thank you very much for allowing me to participate in the care of this nice patient. If I can be of any further assistance to you please do not hesitate to call me at [phone].  Sincerely,   Star Age, MD, PhD

## 2022-09-24 NOTE — Telephone Encounter (Signed)
[employer] medicare sent to [facility] they obtain auth [id number] 

## 2022-10-05 ENCOUNTER — Encounter: Payer: Self-pay | Admitting: Neurology

## 2022-10-09 ENCOUNTER — Ambulatory Visit
Admission: RE | Admit: 2022-10-09 | Discharge: 2022-10-09 | Disposition: A | Discharge status: Home / Self Care | Payer: Medicare HMO | Source: Ambulatory Visit | Attending: Neurology | Admitting: Neurology

## 2022-10-09 DIAGNOSIS — R251 Tremor, unspecified: Secondary | ICD-10-CM | POA: Diagnosis not present

## 2022-10-09 DIAGNOSIS — Z9181 History of falling: Secondary | ICD-10-CM | POA: Diagnosis not present

## 2022-10-09 DIAGNOSIS — R269 Unspecified abnormalities of gait and mobility: Secondary | ICD-10-CM

## 2022-10-14 ENCOUNTER — Telehealth: Payer: Self-pay | Admitting: Neurology

## 2022-10-14 NOTE — Telephone Encounter (Signed)
Pt is requesting a call back for MRI results.

## 2022-10-14 NOTE — Telephone Encounter (Signed)
I called pt and relayed the MRI results as per Dr. Guadelupe Sabin result note.  I relayed that SVID is part of aging, but also keeping hypertenstion, high cholesterol, diabetes, all under control. Eat heart healthy diet, exercise.  She verbalized understanding.  She is to f/u with her family doctor.

## 2022-12-03 ENCOUNTER — Encounter: Payer: Self-pay | Admitting: Cardiology

## 2022-12-04 ENCOUNTER — Other Ambulatory Visit: Payer: Self-pay

## 2022-12-04 ENCOUNTER — Ambulatory Visit: Payer: Medicare HMO | Attending: Family Medicine | Admitting: Physical Therapy

## 2022-12-04 ENCOUNTER — Encounter: Payer: Self-pay | Admitting: Physical Therapy

## 2022-12-04 DIAGNOSIS — M5459 Other low back pain: Secondary | ICD-10-CM | POA: Diagnosis not present

## 2022-12-04 DIAGNOSIS — R293 Abnormal posture: Secondary | ICD-10-CM | POA: Diagnosis present

## 2022-12-04 DIAGNOSIS — M6281 Muscle weakness (generalized): Secondary | ICD-10-CM | POA: Diagnosis not present

## 2022-12-04 DIAGNOSIS — M25512 Pain in left shoulder: Secondary | ICD-10-CM | POA: Diagnosis not present

## 2022-12-04 DIAGNOSIS — M1711 Unilateral primary osteoarthritis, right knee: Secondary | ICD-10-CM | POA: Diagnosis not present

## 2022-12-04 DIAGNOSIS — G8929 Other chronic pain: Secondary | ICD-10-CM | POA: Diagnosis not present

## 2022-12-04 DIAGNOSIS — Z96652 Presence of left artificial knee joint: Secondary | ICD-10-CM | POA: Diagnosis not present

## 2022-12-04 DIAGNOSIS — R600 Localized edema: Secondary | ICD-10-CM | POA: Diagnosis not present

## 2022-12-04 DIAGNOSIS — R2689 Other abnormalities of gait and mobility: Secondary | ICD-10-CM | POA: Diagnosis not present

## 2022-12-04 NOTE — Therapy (Signed)
OUTPATIENT PHYSICAL THERAPY LOWER EXTREMITY EVALUATION   Patient Name: Tricia Harrison MRN: [id number] DOB:1946/02/17, 77 y.o., female Today's Date: 12/04/2022  END OF SESSION:  PT End of Session - 12/04/22 1407     Visit Number 1    Number of Visits 24    Date for PT Re-Evaluation 03/05/23    Authorization Type [employer] MEDICARE HMO/PPO    Progress Note Due on Visit 10    PT Start Time 1153    PT Stop Time 1330    PT Time Calculation (min) 97 min    Equipment Utilized During Treatment Gait belt    Activity Tolerance Patient tolerated treatment well;Patient limited by fatigue;Patient limited by pain    Behavior During Therapy WFL for tasks assessed/performed             Past Medical History:  Diagnosis Date   Anemia    as a child   Asthma    as a child   Chronic back pain    spinal stenosis and buldging disc;scoliosis   Chronic constipation    occasionally takes something OTC   Chronic kidney disease    Diabetes mellitus without complication ([place])    per pt "Pre" for 20+yrs   Diverticulosis    DVT (deep venous thrombosis) ([facility])    Gallstones    has known about this for 3-47yr   GERD (gastroesophageal reflux disease)    takes Omeprazole daily   H/O hiatal hernia    Heart murmur    Hemorrhoids    History of blood transfusion    no abnormal reaction noted   History of bronchitis    states its been a long time ago   History of gout    HLD (hyperlipidemia)    takes Fenofibrate daily   HTN (hypertension)    takes Diltiazem and Ramipril daily   Hypothyroidism    takes Synthroid daily   Left knee DJD    Nocturia    Palpitations    started in 2013 and only occasionally;last time noticed about 2-3wks ago and after drinking caffeine   PONV (postoperative nausea and vomiting)    [place]   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   Past Surgical  History:  Procedure Laterality Date   3-day EVENT MONITOR  01/2019   Mostly normal monitor.  Predominantly sinus rhythm (rate range 48-105 bpm, average 67 bpm) 9 short atrial runs (fastest = 13 beats rate 135 bpm, longest ~17 seconds-101 bpm) -> not noted on symptom log.  No sustained arrhythmias (tachycardia or bradycardia), or pauses..  Symptoms are noted with PVCs.   CHOLECYSTECTOMY N/A 01/10/2022   Procedure: LAPAROSCOPIC CHOLECYSTECTOMY WITH INTRAOPERATIVE CHOLANGIOGRAM;  Surgeon: GMichael Boston MD;  Location: [facility];  Service: General;  Laterality: N/A;   COLONOSCOPY     CT ANGIOGRAM OF CHEST - PE PROTOCOL  07/2018   Bilateral nonocclusive pulmonary emboli evidence of right heart strain consistent with "submassive "/intermediate PE.  -->  Code PE called.   DILATION AND CURETTAGE OF UTERUS     multiple about 6-7 times   ESOPHAGOGASTRODUODENOSCOPY     NM VQ LUNG SCAN ([facility] HX)  11/23/2018   Low probability for PE   RIGHT/LEFT HEART CATH AND CORONARY ANGIOGRAPHY N/A 08/19/2018   Procedure: RIGHT/LEFT HEART CATH AND CORONARY ANGIOGRAPHY;  Surgeon: VJettie Booze MD;  [facility];  Service: Cardiovascular:: Nonobstructive CAD.  Mild pulmonary pretension.  Mean PA pressure 26 mmHg with PA P 48/20 mmHg.  Normal LVEDP.   THYROID SURGERY Right 2010   TOTAL HIP ARTHROPLASTY Left 2005   TOTAL KNEE ARTHROPLASTY Left 02/21/2014   Procedure: TOTAL KNEE ARTHROPLASTY;  Surgeon: Lorn Junes, MD;  Location: [facility];  Service: Orthopedics;  Laterality: Left;   TRANSTHORACIC ECHOCARDIOGRAM  07/2018    (In setting of acute PE) mild LVH.  EF 55 to 60%.  GR 1 DD.  Aortic sclerosis but no stenosis.  Mild regurgitation.  Mild RA dilation.  Moderate LA dilation.  Moderate tricuspid regurgitation with severe primary hypertension estimated PA pressure 71 mmHg.  RV poorly visualized   TRANSTHORACIC ECHOCARDIOGRAM  10/2018 - 11/2019   a) EF 60-65%.  GR 1 DD.  Focal basal LVH. Mod AI/ no AS. Mild  LA dilation.  Normal RV size and fxn.  Mod PI w/ mild TR.  Peak PAP ~ 38 mmHg; b) Normal EF 60 to 65%.  Moderate LVH-GR 1 DD.  R WMA.  Mild AS w/ trivial AI.  Grossly normal PA, RV and RA size.  Normal RV function.  Normal PA pressures.   TRANSTHORACIC ECHOCARDIOGRAM  01/2021    EF 55%.  Normal LV function.  Mild LVH.  GR 1 DD.  Normal PA pressures.  Severe LA dilation.  (Consistent with more significant diastolic dysfunction).  Mild-possibly mild to moderate aortic insufficiency.  Borderline elevated RAP.  Trivial pericardial effusion.  (Stable)   VAGINAL HYSTERECTOMY  1979   Patient Active Problem List   Diagnosis Date Noted   Uses wheelchair 01/14/2022   Velopharyngeal dysfunction with mild swallowing difficulty 01/14/2022   Uses roller walker 01/11/2022   Atherosclerosis of aorta ([place]) 01/11/2022   Chronic kidney disease, stage 3b ([place]) 01/11/2022   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   Orthostasis 01/11/2022   Leukocytosis 01/11/2022   Chronic cholecystitis 01/10/2022   Obstructive sleep apnea 03/09/2019   Aortic regurgitation 12/02/2018   History of pulmonary embolism 08/20/2018   DOE (dyspnea on exertion)    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   Bilateral ovarian cysts    Hx of gallstones    Hernia, hiatal    Renal cysts, acquired, bilateral    GERD (gastroesophageal reflux disease)    Diverticulosis    Lumbar spinal stenosis    Diabetes mellitus without complication ([place])    Left knee DJD    Rapid palpitations 12/06/2013   Morbid obesity ([place]) 12/06/2013   Fatigue 12/06/2013   PVC (premature ventricular contraction) 12/06/2013   Essential hypertension    Hypothyroidism    Hyperlipidemia LDL goal <100      REFERRING PROVIDER: Koirala, Dibas, MD  REFERRING DIAG: L knee TKA/ R knee OA  THERAPY DIAG:  Muscle weakness (generalized)  Other abnormalities of gait and mobility  Abnormal posture  Other low back pain  Chronic left shoulder  pain  Localized edema  Rationale for Evaluation and Treatment: Rehabilitation  ONSET DATE: Ongoing for multiple years.   SUBJECTIVE:   SUBJECTIVE STATEMENT: Pt presents to PT with L and R chronic knee pain. She has a hx of a L TKA and and R OA. Pt reports crepitus in her R knee with movement. She has a long complex medical hx, resulting in L sided weakness but denies a stroke per her CT scan. Pt has fallen 3x in past 24 hours due to pain and weakness in her bilat LE's. Her  daughter in law is present with her during her eval, and states that she has been having ongoing weakness and balance issues since 2022/2023. Pt has received HHPT, and was walking actively in her house prior to falls which resulted in fear and a more sedentary lifestyle.   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  PAIN:  Are you having pain? Yes: NPRS scale: 8 L knee/10 R knee/10 Pain location: R and L knee.  Pain description: Swelling, stabbing pain Aggravating factors: Standing, turning R knee.  Relieving factors: Rest, [place], heat.  PRECAUTIONS: Fall, Monitor stroke symptoms.   WEIGHT BEARING RESTRICTIONS:  W/C bound due to pain and weakness.   FALLS:  Has patient fallen in last 6 months? Yes. Number of falls 3 in 24 hours. 6+ pt is unable to recall all.   LIVING ENVIRONMENT: Lives with:  Lives with her sister, but son lives a few houses down.  Lives in: House/apartment Stairs: Yes: Internal: 12 steps; on right going up and External: 1 steps; bilateral but cannot reach both Has following equipment at home: Single point cane, Quad cane large base, Walker - 2 wheeled, Walker - 4 wheeled, Shower bench, bed side commode, and Pt has a scooter.   OCCUPATION: Retired  PLOF: Independent  PATIENT GOALS: Pt would like to be able to walk without falling again.   NEXT MD VISIT:   OBJECTIVE:   DIAGNOSTIC FINDINGS: MRI brain (without) demonstrating: - Mild  chronic small vessel ischemic disease. - No acute findings.  PATIENT SURVEYS:  FOTO Not taken today due to time restraint.   COGNITION: Overall cognitive status: Within functional limits for tasks assessed     SENSATION: Pt reports feeling her blood moving and her skin crawling upon waking.   EDEMA:  Circumferential: L:20", R:19.5"; R calf R: 16", L:17".    POSTURE:  Abnormal posture noted throughout body.   PALPATION: Pt reports pain in her L calf with palpation.   LOWER EXTREMITY ROM:  Active ROM Right eval Left eval  Hip flexion 50%  25%   Knee flexion 110 100  Knee extension -30 P:-10 -32/ P:-25  Ankle dorsiflexion 50%  50%  Ankle plantarflexion WFL WFL   (Blank rows = not tested)  Pt was seated in a Rolator walker.   LOWER EXTREMITY MMT:  MMT Right eval Left eval  Hip flexion 4- 3  Knee flexion 4- 3+  Knee extension 4- 3+   (Blank rows = not tested)  Pt demonstrates decreased functional ROM of her L UE due to weakness and pain. She has a tendency to move in a synergetic pattern.   FUNCTIONAL TESTS:  5 times sit to stand: 42.57 sec   GAIT: Distance walked: Pt walks with multiple deficits with her gait. L LE foot drag, stooped posture, decreased knee extension, slow shuffled gait, ect.  Assistive device utilized: Environmental consultant - 2 wheeled Level of assistance: CGA and with WC following behind due to reports of knee's giving out and falling.  Comments: Pt reports fatigue.    TODAY'S TREATMENT:  DATE: Extensive education and evaluation performed.     PATIENT EDUCATION:  Education details: Educated pt on anatomy and physiology of current symptoms, diagnosis, prognosis,  and POC. Person educated: Patient and DIL Education method: Explanation and Demonstration Education comprehension: verbalized understanding, returned demonstration, and  needs further education  HOME EXERCISE PROGRAM: None provided today.    ASSESSMENT:  CLINICAL IMPRESSION: Patient referred to PT for reported pain in her bilat knees. Pt has a long complex medical hx with reports of having a L TKA, and R knee OA. Pt presents to PT with DIL, in Rolator being utilized as a WC for ambulation. Pt Reports and demonstrates weakness in her entire L side> R. She reports getting a gallbladder surgery feb 2023 that resulted in increased weakness in her L side, dysphasia, and increased falls. Pt recently received a CT scan that shows mild chronic small vessel ischemic disease, but no hx of stroke. Pt unable to stand straight due to limitations in ROM, weakness and balance. Pt with increased difficulty walking requirig CGA and WC close by. She reports falling 3x in past 24 hours. Patient will benefit from skilled PT to address below impairments, limitations and improve overall function.  OBJECTIVE IMPAIRMENTS: decreased activity tolerance, difficulty walking, decreased balance, decreased endurance, decreased mobility, decreased ROM, decreased strength, impaired flexibility, impaired UE/LE use, postural dysfunction, and pain.  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  are also affecting patient's functional outcome.  REHAB POTENTIAL: Fair due to complex medical hx.   CLINICAL DECISION MAKING: Unstable/unpredictable  EVALUATION COMPLEXITY: Low    GOALS: Short term PT Goals Target date: 12/18/2022 Pt will be I and compliant with HEP. Baseline:  Goal status: New Pt will decrease pain by 25% overall Baseline: Goal status: New  Long term PT goals Target date: 02/26/2023 Pt will improve ROM to [facility] to improve functional mobility in LE and UE.  Baseline: Goal status: New Pt will improve L hip/knee  strength to at least 4/5 MMT to improve functional strength Baseline: Goal status: New Pt will improve FOTO to at least ... (PLEASE UPDATE) % functional to show improved function Baseline: Goal status: New Pt will reduce pain by overall 50% overall with usual activity Baseline: Goal status: New Pt will improve her STS by 2.3 seconds for Minimal clinically important difference.          Baseline:                Goal status: New  Pt will be able to ambulate community distances at least 1000 ft WNL gait pattern without complaints Baseline: Goal status: New        7. Pt will improve TUG by 3.4 seconds or Minimal clinically important difference.         Baseline:         Goal status: New   PLAN: PT FREQUENCY: 2 times per week   PT DURATION:  12 weeks  PLANNED INTERVENTIONS (unless contraindicated): aquatic PT, Canalith repositioning, cryotherapy, Electrical stimulation, Iontophoresis with 4 mg/ml dexamethasome, Moist heat, traction, Ultrasound, gait training, Therapeutic exercise, balance training, neuromuscular re-education, patient/family education, prosthetic training, manual techniques, passive ROM, dry needling, taping, vasopnuematic device, vestibular, spinal manipulations, joint manipulations  PLAN FOR NEXT SESSION: Provide HEP. Work on strength of UE/LE, balance, pain modulation and edema.    Lynden Ang, PT 12/04/2022, 2:08 PM

## 2022-12-09 ENCOUNTER — Ambulatory Visit: Payer: Medicare HMO | Admitting: Physical Therapy

## 2022-12-09 DIAGNOSIS — G8929 Other chronic pain: Secondary | ICD-10-CM | POA: Diagnosis not present

## 2022-12-09 DIAGNOSIS — M5459 Other low back pain: Secondary | ICD-10-CM

## 2022-12-09 DIAGNOSIS — R293 Abnormal posture: Secondary | ICD-10-CM

## 2022-12-09 DIAGNOSIS — M6281 Muscle weakness (generalized): Secondary | ICD-10-CM

## 2022-12-09 DIAGNOSIS — R2689 Other abnormalities of gait and mobility: Secondary | ICD-10-CM

## 2022-12-09 NOTE — Therapy (Signed)
OUTPATIENT PHYSICAL THERAPY LOWER EXTREMITY EVALUATION   Patient Name: Tricia Harrison MRN: [id number] DOB:[birth year], 77 y.o., female Today's Date: 12/09/2022  END OF SESSION:  PT End of Session - 12/09/22 1130     Visit Number 2    Number of Visits 24    Date for PT Re-Evaluation 03/05/23    Authorization Type [employer] MEDICARE HMO/PPO    Progress Note Due on Visit 10    PT Start Time 1105    PT Stop Time 1143    PT Time Calculation (min) 38 min    Equipment Utilized During Treatment Gait belt    Activity Tolerance Patient tolerated treatment well;Patient limited by pain    Behavior During Therapy WFL for tasks assessed/performed              Past Medical History:  Diagnosis Date   Anemia    as a child   Asthma    as a child   Chronic back pain    spinal stenosis and buldging disc;scoliosis   Chronic constipation    occasionally takes something OTC   Chronic kidney disease    Diabetes mellitus without complication ([place])    per pt "Pre" for 20+yrs   Diverticulosis    DVT (deep venous thrombosis) ([place])    Gallstones    has known about this for 3-62yr   GERD (gastroesophageal reflux disease)    takes Omeprazole daily   H/O hiatal hernia    Heart murmur    Hemorrhoids    History of blood transfusion    no abnormal reaction noted   History of bronchitis    states its been a long time ago   History of gout    HLD (hyperlipidemia)    takes Fenofibrate daily   HTN (hypertension)    takes Diltiazem and Ramipril daily   Hypothyroidism    takes Synthroid daily   Left knee DJD    Nocturia    Palpitations    started in 2013 and only occasionally;last time noticed about 2-3wks ago and after drinking caffeine   PONV (postoperative nausea and vomiting)    [place]ulmonary [place]   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   Past Surgical History:  Procedure  Laterality Date   3-day EVENT MONITOR  01/2019   Mostly normal monitor.  Predominantly sinus rhythm (rate range 48-105 bpm, average 67 bpm) 9 short atrial runs (fastest = 13 beats rate 135 bpm, longest ~17 seconds-101 bpm) -> not noted on symptom log.  No sustained arrhythmias (tachycardia or bradycardia), or pauses..  Symptoms are noted with PVCs.   CHOLECYSTECTOMY N/A 01/10/2022   Procedure: LAPAROSCOPIC CHOLECYSTECTOMY WITH INTRAOPERATIVE CHOLANGIOGRAM;  Surgeon: GMichael Boston MD;  Location: [facility];  Service: General;  Laterality: N/A;   COLONOSCOPY     CT ANGIOGRAM OF CHEST - PE PROTOCOL  07/2018   Bilateral nonocclusive pulmonary emboli evidence of right heart strain consistent with "submassive "/intermediate PE.  -->  Code PE called.   DILATION AND CURETTAGE OF UTERUS     multiple about 6-7 times   ESOPHAGOGASTRODUODENOSCOPY     NM VQ LUNG SCAN ([facility] HX)  11/23/2018   Low probability for PE   RIGHT/LEFT HEART CATH AND CORONARY ANGIOGRAPHY N/A 08/19/2018   Procedure: RIGHT/LEFT HEART CATH AND CORONARY ANGIOGRAPHY;  Surgeon: VJettie Booze MD;  [facility];  Service:  Cardiovascular:: Nonobstructive CAD.  Mild pulmonary pretension.  Mean PA pressure 26 mmHg with PA P 48/20 mmHg.  Normal LVEDP.   THYROID SURGERY Right 2010   TOTAL HIP ARTHROPLASTY Left 2005   TOTAL KNEE ARTHROPLASTY Left 02/21/2014   Procedure: TOTAL KNEE ARTHROPLASTY;  Surgeon: Lorn Junes, MD;  Location: [facility];  Service: Orthopedics;  Laterality: Left;   TRANSTHORACIC ECHOCARDIOGRAM  07/2018    (In setting of acute PE) mild LVH.  EF 55 to 60%.  GR 1 DD.  Aortic sclerosis but no stenosis.  Mild regurgitation.  Mild RA dilation.  Moderate LA dilation.  Moderate tricuspid regurgitation with severe primary hypertension estimated PA pressure 71 mmHg.  RV poorly visualized   TRANSTHORACIC ECHOCARDIOGRAM  10/2018 - 11/2019   a) EF 60-65%.  GR 1 DD.  Focal basal LVH. Mod AI/ no AS. Mild LA dilation.  Normal  RV size and fxn.  Mod PI w/ mild TR.  Peak PAP ~ 38 mmHg; b) Normal EF 60 to 65%.  Moderate LVH-GR 1 DD.  R WMA.  Mild AS w/ trivial AI.  Grossly normal PA, RV and RA size.  Normal RV function.  Normal PA pressures.   TRANSTHORACIC ECHOCARDIOGRAM  01/2021    EF 55%.  Normal LV function.  Mild LVH.  GR 1 DD.  Normal PA pressures.  Severe LA dilation.  (Consistent with more significant diastolic dysfunction).  Mild-possibly mild to moderate aortic insufficiency.  Borderline elevated RAP.  Trivial pericardial effusion.  (Stable)   VAGINAL HYSTERECTOMY  1979   Patient Active Problem List   Diagnosis Date Noted   Uses wheelchair 01/14/2022   Velopharyngeal dysfunction with mild swallowing difficulty 01/14/2022   Uses roller walker 01/11/2022   Atherosclerosis of aorta ([place]) 01/11/2022   Chronic kidney disease, stage 3b ([place]) 01/11/2022   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   Orthostasis 01/11/2022   Leukocytosis 01/11/2022   Chronic cholecystitis 01/10/2022   Obstructive sleep apnea 03/09/2019   Aortic regurgitation 12/02/2018   History of pulmonary embolism 08/20/2018   DOE (dyspnea on exertion)    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   Bilateral ovarian cysts    Hx of gallstones    Hernia, hiatal    Renal cysts, acquired, bilateral    GERD (gastroesophageal reflux disease)    Diverticulosis    Lumbar spinal stenosis    Diabetes mellitus without complication ([place])    Left knee DJD    Rapid palpitations 12/06/2013   Morbid obesity ([place]) 12/06/2013   Fatigue 12/06/2013   PVC (premature ventricular contraction) 12/06/2013   Essential hypertension    Hypothyroidism    Hyperlipidemia LDL goal <100      REFERRING PROVIDER: Koirala, Dibas, MD  REFERRING DIAG: L knee TKA/ R knee OA  THERAPY DIAG:  Muscle weakness (generalized)  Other abnormalities of gait and mobility  Abnormal posture  Other low back pain  Rationale for Evaluation and Treatment:  Rehabilitation  ONSET DATE: Ongoing for multiple years.   SUBJECTIVE:   SUBJECTIVE STATEMENT: Pt states that her urinary incontinence has gotten progressively worse. She reports having to go multiple times in an hour. She states that she got a new adjustable bed.   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  PAIN:  Are you having pain? Yes: NPRS scale: 8 L knee/10 R knee/10 Pain location: R and L knee.  Pain description: Swelling, stabbing pain Aggravating factors: Standing, turning R  knee.  Relieving factors: Rest, [place], heat.  PRECAUTIONS: Fall, Monitor stroke symptoms.   WEIGHT BEARING RESTRICTIONS:  W/C bound due to pain and weakness.   FALLS:  Has patient fallen in last 6 months? Yes. Number of falls 3 in 24 hours. 6+ pt is unable to recall all.   LIVING ENVIRONMENT: Lives with:  Lives with her sister, but son lives a few houses down.  Lives in: House/apartment Stairs: Yes: Internal: 12 steps; on right going up and External: 1 steps; bilateral but cannot reach both Has following equipment at home: Single point cane, Quad cane large base, Walker - 2 wheeled, Walker - 4 wheeled, Shower bench, bed side commode, and Pt has a scooter.   OCCUPATION: Retired  PLOF: Independent  PATIENT GOALS: Pt would like to be able to walk without falling again.   NEXT MD VISIT:   OBJECTIVE:   DIAGNOSTIC FINDINGS: MRI brain (without) demonstrating: - Mild chronic small vessel ischemic disease. - No acute findings.  PATIENT SURVEYS:  FOTO Not taken today due to time restraint.   COGNITION: Overall cognitive status: Within functional limits for tasks assessed   SENSATION: Pt reports feeling her blood moving and her skin crawling upon waking.   EDEMA:  Circumferential: L:20", R:19.5"; R calf R: 16", L:17".    POSTURE:  Abnormal posture noted throughout body.   PALPATION: Pt reports pain in her L calf with palpation.    FOTO: 18.42%, 36% predicted   LOWER EXTREMITY ROM:  Active ROM Right eval Left eval  Hip flexion 50%  25%   Knee flexion 110 100  Knee extension -30 P:-10 -32/ P:-25  Ankle dorsiflexion 50%  50%  Ankle plantarflexion WFL WFL   (Blank rows = not tested)  Pt was seated in a Rolator walker.   LOWER EXTREMITY MMT:  MMT Right eval Left eval  Hip flexion 4- 3  Knee flexion 4- 3+  Knee extension 4- 3+   (Blank rows = not tested)  Pt demonstrates decreased functional ROM of her L UE due to weakness and pain. She has a tendency to move in a synergetic pattern.   FUNCTIONAL TESTS:  5 times sit to stand: 42.57 sec   GAIT: Distance walked: Pt walks with multiple deficits with her gait. L LE foot drag, stooped posture, decreased knee extension, slow shuffled gait, ect.  Assistive device utilized: Environmental consultant - 2 wheeled Level of assistance: CGA and with WC following behind due to reports of knee's giving out and falling.  Comments: Pt reports fatigue.    TODAY'S TREATMENT:   12/09/2022: Provided HEP/ updated FOTO.  Nustep lvl4, 6 min Seated ball squeezes 5 sec hold, 10 x Sit to stand x5 from various surfaces with CGA and FWW.  LAQ x15, bilat, VC for full available ROM.  DATE: Extensive education and evaluation performed.     PATIENT EDUCATION:  Education details: Educated pt on anatomy and physiology of current symptoms, diagnosis, prognosis,  and POC. Person educated: Patient and DIL Education method: Customer service manager Education comprehension: verbalized understanding, returned demonstration, and needs further education  HOME EXERCISE PROGRAM: Access Code: [email or web address] URL: [email or web address] Date: 12/09/2022 Prepared by: Rudi Heap  Exercises - Seated Long Arc Quad  - 1 x daily - 7 x weekly - 2 sets - 10 reps - Seated  March  - 1 x daily - 7 x weekly - 2 sets - 10 reps - Seated Hip Adduction Squeeze with Ball  - 1 x daily - 7 x weekly - 2 sets - 10 reps - 5 hold   ASSESSMENT:  CLINICAL IMPRESSION: Patient presents to her first follow up appt with DIL present. She states that she had an increase in urinary frequency the past few days, and has reached out to her MD. She was denied aide help at home, but is following up with an independent company. Pt tolerated all prescribed exercises well today with no increase in pain, but reported muscle fatigue in her L LE with marches. Pt will continue to benefit from skilled PT to address continued deficits.   OBJECTIVE IMPAIRMENTS: decreased activity tolerance, difficulty walking, decreased balance, decreased endurance, decreased mobility, decreased ROM, decreased strength, impaired flexibility, impaired UE/LE use, postural dysfunction, and pain.  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  are also affecting patient's functional outcome.  REHAB POTENTIAL: Fair due to complex medical hx.   CLINICAL DECISION MAKING: Unstable/unpredictable  EVALUATION COMPLEXITY: Low   GOALS: Short term PT Goals Target date: 12/23/2022 Pt will be I and compliant with HEP. Baseline:  Goal status: New Pt will decrease pain by 25% overall Baseline: Goal status: New  Long term PT goals Target date: 03/03/2023 Pt will improve ROM to [facility] to improve functional mobility in LE and UE.  Baseline: Goal status: New Pt will improve L hip/knee strength to at least 4/5 MMT to improve functional strength Baseline: Goal status: New Pt will improve FOTO to at least 36% functional to show improved function Baseline: Goal status: New Pt will reduce pain by overall 50% overall with usual activity Baseline: Goal status: New Pt  will improve her STS by 2.3 seconds for Minimal clinically important difference.          Baseline:                Goal status: New  Pt will be able to ambulate community distances at least 1000 ft WNL gait pattern without complaints Baseline: Goal status: New        7. Pt will improve TUG by 3.4 seconds or Minimal clinically important difference.         Baseline:         Goal status: New   PLAN: PT FREQUENCY: 2 times per week   PT DURATION:  12 weeks  PLANNED INTERVENTIONS (unless contraindicated): aquatic PT, Canalith repositioning, cryotherapy, Electrical stimulation, Iontophoresis with 4 mg/ml dexamethasome, Moist heat, traction, Ultrasound, gait training, Therapeutic exercise, balance training, neuromuscular re-education, patient/family education, prosthetic training, manual techniques, passive ROM, dry needling, taping, vasopnuematic device, vestibular, spinal manipulations, joint manipulations  PLAN FOR NEXT SESSION:  Work on strength of UE/LE, balance, pain modulation and edema.  Lynden Ang, PT 12/09/2022, 12:02 PM

## 2022-12-11 ENCOUNTER — Ambulatory Visit: Payer: Medicare HMO

## 2022-12-11 DIAGNOSIS — G8929 Other chronic pain: Secondary | ICD-10-CM | POA: Diagnosis not present

## 2022-12-11 DIAGNOSIS — R293 Abnormal posture: Secondary | ICD-10-CM

## 2022-12-11 DIAGNOSIS — R252 Cramp and spasm: Secondary | ICD-10-CM

## 2022-12-11 DIAGNOSIS — R262 Difficulty in walking, not elsewhere classified: Secondary | ICD-10-CM

## 2022-12-11 DIAGNOSIS — M6281 Muscle weakness (generalized): Secondary | ICD-10-CM

## 2022-12-11 NOTE — Therapy (Signed)
OUTPATIENT PHYSICAL THERAPY LOWER EXTREMITY EVALUATION   Patient Name: Tricia Harrison MRN: [id number] DOB:August 15, 1946, 77 y.o., female Today's Date: 12/11/2022  END OF SESSION:  PT End of Session - 12/11/22 2049     Visit Number 3    Number of Visits 24    Date for PT Re-Evaluation 03/05/23    Authorization Type [employer] MEDICARE HMO/PPO    Progress Note Due on Visit 10    PT Start Time 1445    PT Stop Time 1538    PT Time Calculation (min) 53 min    Equipment Utilized During Treatment Gait belt    Activity Tolerance Patient tolerated treatment well;Patient limited by pain    Behavior During Therapy WFL for tasks assessed/performed              Past Medical History:  Diagnosis Date   Anemia    as a child   Asthma    as a child   Chronic back pain    spinal stenosis and buldging disc;scoliosis   Chronic constipation    occasionally takes something OTC   Chronic kidney disease    Diabetes mellitus without complication ([place])    per pt "Pre" for 20+yrs   Diverticulosis    DVT (deep venous thrombosis) ([facility])    Gallstones    has known about this for 3-28yr   GERD (gastroesophageal reflux disease)    takes Omeprazole daily   H/O hiatal hernia    Heart murmur    Hemorrhoids    History of blood transfusion    no abnormal reaction noted   History of bronchitis    states its been a long time ago   History of gout    HLD (hyperlipidemia)    takes Fenofibrate daily   HTN (hypertension)    takes Diltiazem and Ramipril daily   Hypothyroidism    takes Synthroid daily   Left knee DJD    Nocturia    Palpitations    started in 2013 and only occasionally;last time noticed about 2-3wks ago and after drinking caffeine   PONV (postoperative nausea and vomiting)    [place]ulmonary [place] with [place] [place]   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   Past Surgical History:  Procedure  Laterality Date   3-day EVENT MONITOR  01/2019   Mostly normal monitor.  Predominantly sinus rhythm (rate range 48-105 bpm, average 67 bpm) 9 short atrial runs (fastest = 13 beats rate 135 bpm, longest ~17 seconds-101 bpm) -> not noted on symptom log.  No sustained arrhythmias (tachycardia or bradycardia), or pauses..  Symptoms are noted with PVCs.   CHOLECYSTECTOMY N/A 01/10/2022   Procedure: LAPAROSCOPIC CHOLECYSTECTOMY WITH INTRAOPERATIVE CHOLANGIOGRAM;  Surgeon: GMichael Boston MD;  Location: [facility];  Service: General;  Laterality: N/A;   COLONOSCOPY     CT ANGIOGRAM OF CHEST - PE PROTOCOL  07/2018   Bilateral nonocclusive pulmonary emboli evidence of right heart strain consistent with "submassive "/intermediate PE.  -->  Code PE called.   DILATION AND CURETTAGE OF UTERUS     multiple about 6-7 times   ESOPHAGOGASTRODUODENOSCOPY     NM VQ LUNG SCAN ([facility] HX)  11/23/2018   Low probability for PE   RIGHT/LEFT HEART CATH AND CORONARY ANGIOGRAPHY N/A 08/19/2018   Procedure: RIGHT/LEFT HEART CATH AND CORONARY ANGIOGRAPHY;  Surgeon: VJettie Booze MD;  [facility];  Service:  Cardiovascular:: Nonobstructive CAD.  Mild pulmonary pretension.  Mean PA pressure 26 mmHg with PA P 48/20 mmHg.  Normal LVEDP.   THYROID SURGERY Right 2010   TOTAL HIP ARTHROPLASTY Left 2005   TOTAL KNEE ARTHROPLASTY Left 02/21/2014   Procedure: TOTAL KNEE ARTHROPLASTY;  Surgeon: Lorn Junes, MD;  Location: [facility];  Service: Orthopedics;  Laterality: Left;   TRANSTHORACIC ECHOCARDIOGRAM  07/2018    (In setting of acute PE) mild LVH.  EF 55 to 60%.  GR 1 DD.  Aortic sclerosis but no stenosis.  Mild regurgitation.  Mild RA dilation.  Moderate LA dilation.  Moderate tricuspid regurgitation with severe primary hypertension estimated PA pressure 71 mmHg.  RV poorly visualized   TRANSTHORACIC ECHOCARDIOGRAM  10/2018 - 11/2019   a) EF 60-65%.  GR 1 DD.  Focal basal LVH. Mod AI/ no AS. Mild LA dilation.  Normal  RV size and fxn.  Mod PI w/ mild TR.  Peak PAP ~ 38 mmHg; b) Normal EF 60 to 65%.  Moderate LVH-GR 1 DD.  R WMA.  Mild AS w/ trivial AI.  Grossly normal PA, RV and RA size.  Normal RV function.  Normal PA pressures.   TRANSTHORACIC ECHOCARDIOGRAM  01/2021    EF 55%.  Normal LV function.  Mild LVH.  GR 1 DD.  Normal PA pressures.  Severe LA dilation.  (Consistent with more significant diastolic dysfunction).  Mild-possibly mild to moderate aortic insufficiency.  Borderline elevated RAP.  Trivial pericardial effusion.  (Stable)   VAGINAL HYSTERECTOMY  1979   Patient Active Problem List   Diagnosis Date Noted   Uses wheelchair 01/14/2022   Velopharyngeal dysfunction with mild swallowing difficulty 01/14/2022   Uses roller walker 01/11/2022   Atherosclerosis of aorta ([place]) 01/11/2022   Chronic kidney disease, stage 3b ([place]) 01/11/2022   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   Orthostasis 01/11/2022   Leukocytosis 01/11/2022   Chronic cholecystitis 01/10/2022   Obstructive sleep apnea 03/09/2019   Aortic regurgitation 12/02/2018   History of pulmonary embolism 08/20/2018   DOE (dyspnea on exertion)    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   Bilateral ovarian cysts    Hx of gallstones    Hernia, hiatal    Renal cysts, acquired, bilateral    GERD (gastroesophageal reflux disease)    Diverticulosis    Lumbar spinal stenosis    Diabetes mellitus without complication ([place])    Left knee DJD    Rapid palpitations 12/06/2013   Morbid obesity ([place]) 12/06/2013   Fatigue 12/06/2013   PVC (premature ventricular contraction) 12/06/2013   Essential hypertension    Hypothyroidism    Hyperlipidemia LDL goal <100      REFERRING PROVIDER: Koirala, Dibas, MD  REFERRING DIAG: L knee TKA/ R knee OA  THERAPY DIAG:  Muscle weakness (generalized)  Abnormal posture  Difficulty in walking, not elsewhere classified  Cramp and spasm  Rationale for Evaluation and Treatment:  Rehabilitation  ONSET DATE: Ongoing for multiple years.   SUBJECTIVE:   SUBJECTIVE STATEMENT: Pt states that she is continuing to have difficulty making it to the bathroom during the night.  Dtr states she refuses to put her 3 in 1 beside her bed.  Patients sister is living with her and helps her during the day but sleeps upstairs at night and patient is currently walking to and from bathroom by herself during the night.  She has fallen 3 times in the past 30 days.    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  PAIN:  Are you having pain? Yes: NPRS scale: 8 L knee/10 R knee/10 Pain location: R and L knee.  Pain description: Swelling, stabbing pain Aggravating factors: Standing, turning R knee.  Relieving factors: Rest, [place], heat.  PRECAUTIONS: Fall, Monitor stroke symptoms.   WEIGHT BEARING RESTRICTIONS:  W/C bound due to pain and weakness.   FALLS:  Has patient fallen in last 6 months? Yes. Number of falls 3 in 24 hours. 6+ pt is unable to recall all.   LIVING ENVIRONMENT: Lives with:  Lives with her sister, but son lives a few houses down.  Lives in: House/apartment Stairs: Yes: Internal: 12 steps; on right going up and External: 1 steps; bilateral but cannot reach both Has following equipment at home: Single point cane, Quad cane large base, Walker - 2 wheeled, Walker - 4 wheeled, Shower bench, bed side commode, and Pt has a scooter.   OCCUPATION: Retired  PLOF: Independent  PATIENT GOALS: Pt would like to be able to walk without falling again.   NEXT MD VISIT:   OBJECTIVE:   DIAGNOSTIC FINDINGS: MRI brain (without) demonstrating: - Mild chronic small vessel ischemic disease. - No acute findings.  PATIENT SURVEYS:  FOTO Not taken today due to time restraint.   COGNITION: Overall cognitive status: Within functional limits for tasks assessed   SENSATION: Pt reports feeling her blood moving and her skin  crawling upon waking.   EDEMA:  Circumferential: L:20", R:19.5"; R calf R: 16", L:17".    POSTURE:  Abnormal posture noted throughout body.   PALPATION: Pt reports pain in her L calf with palpation.   FOTO: 18.42%, 36% predicted   LOWER EXTREMITY ROM:  Active ROM Right eval Left eval  Hip flexion 50%  25%   Knee flexion 110 100  Knee extension -30 P:-10 -32/ P:-25  Ankle dorsiflexion 50%  50%  Ankle plantarflexion WFL WFL   (Blank rows = not tested)  Pt was seated in a Rolator walker.   LOWER EXTREMITY MMT:  MMT Right eval Left eval  Hip flexion 4- 3  Knee flexion 4- 3+  Knee extension 4- 3+   (Blank rows = not tested)  Pt demonstrates decreased functional ROM of her L UE due to weakness and pain. She has a tendency to move in a synergetic pattern.   FUNCTIONAL TESTS:  5 times sit to stand: 42.57 sec   GAIT: Distance walked: Pt walks with multiple deficits with her gait. L LE foot drag, stooped posture, decreased knee extension, slow shuffled gait, ect.  Assistive device utilized: Environmental consultant - 2 wheeled Level of assistance: CGA and with WC following behind due to reports of knee's giving out and falling.  Comments: Pt reports fatigue.    TODAY'S TREATMENT:  12/11/2022: Nustep level 1, x 6 min Gait training with rw x 40 feet  Instructed in proper approach to chair/sitting surface and then turning to safely sit by insuring both legs are touching the chair and then reaching back to lower herself safely.   Lengthy discussion ( approx 20 minutes) with patient and dtr regarding fall prevention in the home:  highly encouraged patient to use adult undergarment and to place 3 in 1 bedside at night, clear, well lit pathways and need for, at minimum, stand by assist during any ambulation Instructed patient in propping legs to assist with extension stretch Sit to stand x  5 emphasizing terminal knee extension and  upright posture Attempted up and over hurdle x 10 each seated  but patient could only do 3 reps on right and could not do left at all.  We switched to up and over 5 lb hand weight.  She was able to do 10 on each LE.   Worked on sit to stand using hands to push up, then transferring hands to walker safely and working on upright posture.    12/09/2022: Provided HEP/ updated FOTO.  Nustep lvl4, 6 min Seated ball squeezes 5 sec hold, 10 x Sit to stand x5 from various surfaces with CGA and FWW.  LAQ x15, bilat, VC for full available ROM.                                                                                                                            DATE: Extensive education and evaluation performed.     PATIENT EDUCATION:  Education details: Educated pt on anatomy and physiology of current symptoms, diagnosis, prognosis,  and POC. Person educated: Patient and DIL Education method: Customer service manager Education comprehension: verbalized understanding, returned demonstration, and needs further education  HOME EXERCISE PROGRAM: Access Code: [email or web address] URL: [email or web address] Date: 12/09/2022 Prepared by: Rudi Heap  Exercises - Seated Long Arc Quad  - 1 x daily - 7 x weekly - 2 sets - 10 reps - Seated March  - 1 x daily - 7 x weekly - 2 sets - 10 reps - Seated Hip Adduction Squeeze with Ball  - 1 x daily - 7 x weekly - 2 sets - 10 reps - 5 hold   ASSESSMENT:  CLINICAL IMPRESSION: Patient has multiple orthopedic issues impeding her progress.  Of most concern is her not having continuous assistance including through the night. She was able to complete all tasks today but fatigues very easily.  Pt will continue to benefit from skilled PT to address continued deficits.   OBJECTIVE IMPAIRMENTS: decreased activity tolerance, difficulty walking, decreased balance, decreased endurance, decreased mobility, decreased ROM, decreased strength, impaired flexibility, impaired UE/LE use, postural dysfunction, and pain.  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  are also affecting patient's functional outcome.  REHAB POTENTIAL: Fair due to complex medical hx.   CLINICAL DECISION MAKING: Unstable/unpredictable  EVALUATION COMPLEXITY: Low   GOALS: Short term PT Goals Target date: 12/25/2022 Pt will be I and compliant with HEP. Baseline:  Goal status: New Pt will decrease pain by 25% overall Baseline: Goal status: New  Long term PT goals Target date: 03/05/2023 Pt will improve ROM to [facility] to improve functional mobility in LE and UE.  Baseline: Goal status: New Pt will improve L hip/knee strength to at least 4/5 MMT to improve functional strength Baseline: Goal status: New Pt will improve FOTO to at least 36% functional to show improved function Baseline: Goal status:  New Pt will reduce pain by overall 50% overall with usual activity Baseline: Goal status: New Pt will improve her STS by 2.3 seconds for Minimal clinically important difference.          Baseline:                Goal status: New  Pt will be able to ambulate community distances at least 1000 ft WNL gait pattern without complaints Baseline: Goal status: New        7. Pt will improve TUG by 3.4 seconds or Minimal clinically important difference.         Baseline:         Goal status: New   PLAN: PT FREQUENCY: 2 times per week   PT DURATION:  12 weeks  PLANNED INTERVENTIONS (unless contraindicated): aquatic PT, Canalith repositioning, cryotherapy, Electrical stimulation, Iontophoresis with 4 mg/ml dexamethasome, Moist heat, traction, Ultrasound, gait training, Therapeutic exercise, balance training, neuromuscular re-education, patient/family education, prosthetic training, manual techniques, passive ROM, dry needling, taping, vasopnuematic device, vestibular, spinal  manipulations, joint manipulations  PLAN FOR NEXT SESSION:  Work on strength of UE/LE, balance, pain modulation and edema.    Anderson Malta B. Lequan Dobratz, PT 12/11/22 9:11 PM

## 2022-12-18 ENCOUNTER — Ambulatory Visit: Payer: Medicare HMO

## 2022-12-18 DIAGNOSIS — G8929 Other chronic pain: Secondary | ICD-10-CM | POA: Diagnosis not present

## 2022-12-18 DIAGNOSIS — R262 Difficulty in walking, not elsewhere classified: Secondary | ICD-10-CM

## 2022-12-18 DIAGNOSIS — R252 Cramp and spasm: Secondary | ICD-10-CM

## 2022-12-18 DIAGNOSIS — M6281 Muscle weakness (generalized): Secondary | ICD-10-CM

## 2022-12-18 NOTE — Therapy (Signed)
OUTPATIENT PHYSICAL THERAPY LOWER EXTREMITY TREATMENT NOTE   Patient Name: Tricia Harrison MRN: [id number] DOB:03/11/1946, 77 y.o., female Today's Date: 12/18/2022  END OF SESSION:  PT End of Session - 12/18/22 2304     Visit Number 4    Number of Visits 24    Date for PT Re-Evaluation 03/05/23    Authorization Type [employer] MEDICARE HMO/PPO    Progress Note Due on Visit 10    PT Start Time 1230    PT Stop Time 1310    PT Time Calculation (min) 40 min    Activity Tolerance Patient tolerated treatment well;Patient limited by pain    Behavior During Therapy WFL for tasks assessed/performed              Past Medical History:  Diagnosis Date   Anemia    as a child   Asthma    as a child   Chronic back pain    spinal stenosis and buldging disc;scoliosis   Chronic constipation    occasionally takes something OTC   Chronic kidney disease    Diabetes mellitus without complication ([place])    per pt "Pre" for 20+yrs   Diverticulosis    DVT (deep venous thrombosis) ([facility])    Gallstones    has known about this for 3-27yr   GERD (gastroesophageal reflux disease)    takes Omeprazole daily   H/O hiatal hernia    Heart murmur    Hemorrhoids    History of blood transfusion    no abnormal reaction noted   History of bronchitis    states its been a long time ago   History of gout    HLD (hyperlipidemia)    takes Fenofibrate daily   HTN (hypertension)    takes Diltiazem and Ramipril daily   Hypothyroidism    takes Synthroid daily   Left knee DJD    Nocturia    Palpitations    started in 2013 and only occasionally;last time noticed about 2-3wks ago and after drinking caffeine   PONV (postoperative nausea and vomiting)    Pulmonary embolism with acute cor pulmonale ([facility] 07/2018   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   Past Surgical History:  Procedure Laterality Date   3-day EVENT MONITOR  01/2019    Mostly normal monitor.  Predominantly sinus rhythm (rate range 48-105 bpm, average 67 bpm) 9 short atrial runs (fastest = 13 beats rate 135 bpm, longest ~17 seconds-101 bpm) -> not noted on symptom log.  No sustained arrhythmias (tachycardia or bradycardia), or pauses..  Symptoms are noted with PVCs.   CHOLECYSTECTOMY N/A 01/10/2022   Procedure: LAPAROSCOPIC CHOLECYSTECTOMY WITH INTRAOPERATIVE CHOLANGIOGRAM;  Surgeon: GMichael Boston MD;  Location: [facility];  Service: General;  Laterality: N/A;   COLONOSCOPY     CT ANGIOGRAM OF CHEST - PE PROTOCOL  07/2018   Bilateral nonocclusive pulmonary emboli evidence of right heart strain consistent with "submassive "/intermediate PE.  -->  Code PE called.   DILATION AND CURETTAGE OF UTERUS     multiple about 6-7 times   ESOPHAGOGASTRODUODENOSCOPY     NM VQ LUNG SCAN ([facility] HX)  11/23/2018   Low probability for PE   RIGHT/LEFT HEART CATH AND CORONARY ANGIOGRAPHY N/A 08/19/2018   Procedure: RIGHT/LEFT HEART CATH AND CORONARY ANGIOGRAPHY;  Surgeon: VJettie Booze MD;  [facility];  Service: Cardiovascular:: Nonobstructive CAD.  Mild pulmonary pretension.  Mean PA pressure 26 mmHg with PA P 48/20 mmHg.  Normal LVEDP.   THYROID SURGERY Right 2010   TOTAL HIP ARTHROPLASTY Left 2005   TOTAL KNEE ARTHROPLASTY Left 02/21/2014   Procedure: TOTAL KNEE ARTHROPLASTY;  Surgeon: Lorn Junes, MD;  Location: [place];  Service: Orthopedics;  Laterality: Left;   TRANSTHORACIC ECHOCARDIOGRAM  07/2018    (In setting of acute PE) mild LVH.  EF 55 to 60%.  GR 1 DD.  Aortic sclerosis but no stenosis.  Mild regurgitation.  Mild RA dilation.  Moderate LA dilation.  Moderate tricuspid regurgitation with severe primary hypertension estimated PA pressure 71 mmHg.  RV poorly visualized   TRANSTHORACIC ECHOCARDIOGRAM  10/2018 - 11/2019   a) EF 60-65%.  GR 1 DD.  Focal basal LVH. Mod AI/ no AS. Mild LA dilation.  Normal RV size and fxn.  Mod PI w/ mild TR.  Peak PAP ~  38 mmHg; b) Normal EF 60 to 65%.  Moderate LVH-GR 1 DD.  R WMA.  Mild AS w/ trivial AI.  Grossly normal PA, RV and RA size.  Normal RV function.  Normal PA pressures.   TRANSTHORACIC ECHOCARDIOGRAM  01/2021    EF 55%.  Normal LV function.  Mild LVH.  GR 1 DD.  Normal PA pressures.  Severe LA dilation.  (Consistent with more significant diastolic dysfunction).  Mild-possibly mild to moderate aortic insufficiency.  Borderline elevated RAP.  Trivial pericardial effusion.  (Stable)   VAGINAL HYSTERECTOMY  1979   Patient Active Problem List   Diagnosis Date Noted   Uses wheelchair 01/14/2022   Velopharyngeal dysfunction with mild swallowing difficulty 01/14/2022   Uses roller walker 01/11/2022   Atherosclerosis of aorta ([place]) 01/11/2022   Chronic kidney disease, stage 3b ([place]) 01/11/2022   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   Orthostasis 01/11/2022   Leukocytosis 01/11/2022   Chronic cholecystitis 01/10/2022   Obstructive sleep apnea 03/09/2019   Aortic regurgitation 12/02/2018   History of pulmonary embolism 08/20/2018   DOE (dyspnea on exertion)    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   Bilateral ovarian cysts    Hx of gallstones    Hernia, hiatal    Renal cysts, acquired, bilateral    GERD (gastroesophageal reflux disease)    Diverticulosis    Lumbar spinal stenosis    Diabetes mellitus without complication ([place])    Left knee DJD    Rapid palpitations 12/06/2013   Morbid obesity ([place]) 12/06/2013   Fatigue 12/06/2013   PVC (premature ventricular contraction) 12/06/2013   Essential hypertension    Hypothyroidism    Hyperlipidemia LDL goal <100      REFERRING PROVIDER: Koirala, Dibas, MD  REFERRING DIAG: L knee TKA/ R knee OA  THERAPY DIAG:  Muscle weakness (generalized)  Difficulty in walking, not elsewhere classified  Cramp and spasm  Rationale for Evaluation and Treatment: Rehabilitation  ONSET DATE: Ongoing for multiple years.   SUBJECTIVE:    SUBJECTIVE STATEMENT: Pt states her legs are hurting today.  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  PAIN:  Are you having pain? Yes: NPRS scale: 8 L knee/10 R knee/10 Pain location: R and L knee.  Pain description: Swelling, stabbing pain Aggravating factors: Standing, turning R knee.  Relieving factors: Rest, [place], heat.  PRECAUTIONS: Fall, Monitor stroke symptoms.   WEIGHT BEARING RESTRICTIONS:  W/C bound due to  pain and weakness.   FALLS:  Has patient fallen in last 6 months? Yes. Number of falls 3 in 24 hours. 6+ pt is unable to recall all.   LIVING ENVIRONMENT: Lives with:  Lives with her sister, but son lives a few houses down.  Lives in: House/apartment Stairs: Yes: Internal: 12 steps; on right going up and External: 1 steps; bilateral but cannot reach both Has following equipment at home: Single point cane, Quad cane large base, Walker - 2 wheeled, Walker - 4 wheeled, Shower bench, bed side commode, and Pt has a scooter.   OCCUPATION: Retired  PLOF: Independent  PATIENT GOALS: Pt would like to be able to walk without falling again.   NEXT MD VISIT:   OBJECTIVE:   DIAGNOSTIC FINDINGS: MRI brain (without) demonstrating: - Mild chronic small vessel ischemic disease. - No acute findings.  PATIENT SURVEYS:  FOTO Not taken today due to time restraint.   COGNITION: Overall cognitive status: Within functional limits for tasks assessed   SENSATION: Pt reports feeling her blood moving and her skin crawling upon waking.   EDEMA:  Circumferential: L:20", R:19.5"; R calf R: 16", L:17".    POSTURE:  Abnormal posture noted throughout body.   PALPATION: Pt reports pain in her L calf with palpation.   FOTO: 18.42%, 36% predicted   LOWER EXTREMITY ROM:  Active ROM Right eval Left eval  Hip flexion 50%  25%   Knee flexion 110 100  Knee extension -30  P:-10 -32/ P:-25  Ankle dorsiflexion 50%  50%  Ankle plantarflexion WFL WFL   (Blank rows = not tested)  Pt was seated in a Rolator walker.   LOWER EXTREMITY MMT:  MMT Right eval Left eval  Hip flexion 4- 3  Knee flexion 4- 3+  Knee extension 4- 3+   (Blank rows = not tested)  Pt demonstrates decreased functional ROM of her L UE due to weakness and pain. She has a tendency to move in a synergetic pattern.   FUNCTIONAL TESTS:  5 times sit to stand: 42.57 sec   GAIT: Distance walked: Pt walks with multiple deficits with her gait. L LE foot drag, stooped posture, decreased knee extension, slow shuffled gait, ect.  Assistive device utilized: Environmental consultant - 2 wheeled Level of assistance: CGA and with WC following behind due to reports of knee's giving out and falling.  Comments: Pt reports fatigue.    TODAY'S TREATMENT:  12/18/2022: Nustep level 1, x 6 min Gait training with rw x 45 feet  Instructed in proper approach to chair/sitting surface and then turning to safely sit by insuring both legs are touching the chair and then reaching back to lower herself safely.   Reminded patient that [facility] should be by her bed at night Sit to stand x  5 emphasizing terminal knee extension and upright posture Seated toe and heel raises x 20 Seated LAQ 2 x 10 Seated march x 20 Seated hip ER 2 x 10 each Seated clam x 20  12/11/2022: Nustep level 1, x 6 min Gait training with rw x 40 feet  Instructed in proper approach to chair/sitting surface and then turning to safely sit by insuring both legs are touching the chair and then reaching back to lower herself safely.   Lengthy discussion ( approx 20 minutes) with patient and dtr regarding fall prevention in the home:  highly encouraged patient to use adult undergarment and to place 3 in 1 bedside at night, clear, well lit pathways and need  for, at minimum, stand by assist during any ambulation Instructed patient in propping legs to assist with  extension stretch Sit to stand x  5 emphasizing terminal knee extension and upright posture Attempted up and over hurdle x 10 each seated but patient could only do 3 reps on right and could not do left at all.  We switched to up and over 5 lb hand weight.  She was able to do 10 on each LE.   Worked on sit to stand using hands to push up, then transferring hands to walker safely and working on upright posture.    12/09/2022: Provided HEP/ updated FOTO.  Nustep lvl4, 6 min Seated ball squeezes 5 sec hold, 10 x Sit to stand x5 from various surfaces with CGA and FWW.  LAQ x15, bilat, VC for full available ROM.                                                                                                                            DATE: Extensive education and evaluation performed.     PATIENT EDUCATION:  Education details: Educated pt on anatomy and physiology of current symptoms, diagnosis, prognosis,  and POC. Person educated: Patient and DIL Education method: Customer service manager Education comprehension: verbalized understanding, returned demonstration, and needs further education  HOME EXERCISE PROGRAM: Access Code: [email or web address] URL: [email or web address] Date: 12/09/2022 Prepared by: Rudi Heap  Exercises - Seated Long Arc Quad  - 1 x daily - 7 x weekly - 2 sets - 10 reps - Seated March  - 1 x daily - 7 x weekly - 2 sets - 10 reps - Seated Hip Adduction Squeeze with Ball  - 1 x daily - 7 x weekly - 2 sets - 10 reps - 5 hold   ASSESSMENT:  CLINICAL IMPRESSION: Tricia Harrison was able to walk approx 5 feet further than last visit.  She continues to refuse to wear adult undergarment or place her [facility] beside her bed.  We emphasized need to make these changes to reduce fall risk.    Pt will continue to benefit from skilled PT to address continued deficits.   OBJECTIVE IMPAIRMENTS: decreased activity tolerance, difficulty walking, decreased balance, decreased endurance,  decreased mobility, decreased ROM, decreased strength, impaired flexibility, impaired UE/LE use, postural dysfunction, and pain.  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  are also affecting patient's functional outcome.  REHAB POTENTIAL: Fair due to complex medical hx.   CLINICAL DECISION MAKING: Unstable/unpredictable  EVALUATION COMPLEXITY: Low   GOALS: Short term PT Goals Target date: 01/01/2023 Pt will be I and compliant with HEP. Baseline:  Goal status: New Pt will decrease pain by 25% overall Baseline: Goal status: New  Long term PT goals Target date: 03/12/2023 Pt will improve ROM to [facility] to improve functional mobility in LE and UE.  Baseline:  Goal status: New Pt will improve L hip/knee strength to at least 4/5 MMT to improve functional strength Baseline: Goal status: New Pt will improve FOTO to at least 36% functional to show improved function Baseline: Goal status: New Pt will reduce pain by overall 50% overall with usual activity Baseline: Goal status: New Pt will improve her STS by 2.3 seconds for Minimal clinically important difference.          Baseline:                Goal status: New  Pt will be able to ambulate community distances at least 1000 ft WNL gait pattern without complaints Baseline: Goal status: New        7. Pt will improve TUG by 3.4 seconds or Minimal clinically important difference.         Baseline:         Goal status: New   PLAN: PT FREQUENCY: 2 times per week   PT DURATION:  12 weeks  PLANNED INTERVENTIONS (unless contraindicated): aquatic PT, Canalith repositioning, cryotherapy, Electrical stimulation, Iontophoresis with 4 mg/ml dexamethasome, Moist heat, traction, Ultrasound, gait training, Therapeutic exercise, balance training, neuromuscular re-education,  patient/family education, prosthetic training, manual techniques, passive ROM, dry needling, taping, vasopnuematic device, vestibular, spinal manipulations, joint manipulations  PLAN FOR NEXT SESSION:  Work on strength of UE/LE, balance, pain modulation and edema.    Anderson Malta B. Mahrosh Donnell, PT 12/18/22 11:20 PM  [facility] [address], [place], [place] [address] Phone # [phone] Fax [phone]

## 2022-12-25 ENCOUNTER — Ambulatory Visit: Payer: Medicare HMO

## 2022-12-25 DIAGNOSIS — G8929 Other chronic pain: Secondary | ICD-10-CM | POA: Diagnosis not present

## 2022-12-25 DIAGNOSIS — M6281 Muscle weakness (generalized): Secondary | ICD-10-CM

## 2022-12-25 DIAGNOSIS — R293 Abnormal posture: Secondary | ICD-10-CM

## 2022-12-25 DIAGNOSIS — R252 Cramp and spasm: Secondary | ICD-10-CM

## 2022-12-25 DIAGNOSIS — R262 Difficulty in walking, not elsewhere classified: Secondary | ICD-10-CM

## 2022-12-25 NOTE — Therapy (Signed)
OUTPATIENT PHYSICAL THERAPY LOWER EXTREMITY TREATMENT NOTE   Patient Name: Tricia Harrison MRN: [id number] DOB:11-Dec-1945, 77 y.o., female Today's Date: 12/25/2022  END OF SESSION:  PT End of Session - 12/25/22 1235     Visit Number 5    Number of Visits 24    Date for PT Re-Evaluation 03/05/23    Authorization Type [employer] MEDICARE HMO/PPO    Progress Note Due on Visit 10    PT Start Time 1230    PT Stop Time 1315    PT Time Calculation (min) 45 min    Activity Tolerance Patient limited by pain    Behavior During Therapy WFL for tasks assessed/performed              Past Medical History:  Diagnosis Date   Anemia    as a child   Asthma    as a child   Chronic back pain    spinal stenosis and buldging disc;scoliosis   Chronic constipation    occasionally takes something OTC   Chronic kidney disease    Diabetes mellitus without complication ([place])    per pt "Pre" for 20+yrs   Diverticulosis    DVT (deep venous thrombosis) ([facility])    Gallstones    has known about this for 3-46yr   GERD (gastroesophageal reflux disease)    takes Omeprazole daily   H/O hiatal hernia    Heart murmur    Hemorrhoids    History of blood transfusion    no abnormal reaction noted   History of bronchitis    states its been a long time ago   History of gout    HLD (hyperlipidemia)    takes Fenofibrate daily   HTN (hypertension)    takes Diltiazem and Ramipril daily   Hypothyroidism    takes Synthroid daily   Left knee DJD    Nocturia    Palpitations    started in 2013 and only occasionally;last time noticed about 2-3wks ago and after drinking caffeine   PONV (postoperative nausea and vomiting)    Pulmonary embol[place]ale [place]   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   Past Surgical History:  Procedure Laterality Date   3-day EVENT MONITOR  01/2019   Mostly normal monitor.   Predominantly sinus rhythm (rate range 48-105 bpm, average 67 bpm) 9 short atrial runs (fastest = 13 beats rate 135 bpm, longest ~17 seconds-101 bpm) -> not noted on symptom log.  No sustained arrhythmias (tachycardia or bradycardia), or pauses..  Symptoms are noted with PVCs.   CHOLECYSTECTOMY N/A 01/10/2022   Procedure: LAPAROSCOPIC CHOLECYSTECTOMY WITH INTRAOPERATIVE CHOLANGIOGRAM;  Surgeon: GMichael Boston MD;  Location: [facility];  Service: General;  Laterality: N/A;   COLONOSCOPY     CT ANGIOGRAM OF CHEST - PE PROTOCOL  07/2018   Bilateral nonocclusive pulmonary emboli evidence of right heart strain consistent with "submassive "/intermediate PE.  -->  Code PE called.   DILATION AND CURETTAGE OF UTERUS     multiple about 6-7 times   ESOPHAGOGASTRODUODENOSCOPY     NM VQ LUNG SCAN ([facility] HX)  11/23/2018   Low probability for PE   RIGHT/LEFT HEART CATH AND CORONARY ANGIOGRAPHY N/A 08/19/2018   Procedure: RIGHT/LEFT HEART CATH AND CORONARY ANGIOGRAPHY;  Surgeon: VJettie Booze MD;  [facility];  Service: Cardiovascular:: Nonobstructive CAD.  Mild pulmonary pretension.  Mean PA pressure  26 mmHg with PA P 48/20 mmHg.  Normal LVEDP.   THYROID SURGERY Right 2010   TOTAL HIP ARTHROPLASTY Left 2005   TOTAL KNEE ARTHROPLASTY Left 02/21/2014   Procedure: TOTAL KNEE ARTHROPLASTY;  Surgeon: Lorn Junes, MD;  Location: [place];  Service: Orthopedics;  Laterality: Left;   TRANSTHORACIC ECHOCARDIOGRAM  07/2018    (In setting of acute PE) mild LVH.  EF 55 to 60%.  GR 1 DD.  Aortic sclerosis but no stenosis.  Mild regurgitation.  Mild RA dilation.  Moderate LA dilation.  Moderate tricuspid regurgitation with severe primary hypertension estimated PA pressure 71 mmHg.  RV poorly visualized   TRANSTHORACIC ECHOCARDIOGRAM  10/2018 - 11/2019   a) EF 60-65%.  GR 1 DD.  Focal basal LVH. Mod AI/ no AS. Mild LA dilation.  Normal RV size and fxn.  Mod PI w/ mild TR.  Peak PAP ~ 38 mmHg; b) Normal EF 60  to 65%.  Moderate LVH-GR 1 DD.  R WMA.  Mild AS w/ trivial AI.  Grossly normal PA, RV and RA size.  Normal RV function.  Normal PA pressures.   TRANSTHORACIC ECHOCARDIOGRAM  01/2021    EF 55%.  Normal LV function.  Mild LVH.  GR 1 DD.  Normal PA pressures.  Severe LA dilation.  (Consistent with more significant diastolic dysfunction).  Mild-possibly mild to moderate aortic insufficiency.  Borderline elevated RAP.  Trivial pericardial effusion.  (Stable)   VAGINAL HYSTERECTOMY  1979   Patient Active Problem List   Diagnosis Date Noted   Uses wheelchair 01/14/2022   Velopharyngeal dysfunction with mild swallowing difficulty 01/14/2022   Uses roller walker 01/11/2022   Atherosclerosis of aorta ([place]) 01/11/2022   Chronic kidney disease, stage 3b ([place]) 01/11/2022   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   Orthostasis 01/11/2022   Leukocytosis 01/11/2022   Chronic cholecystitis 01/10/2022   Obstructive sleep apnea 03/09/2019   Aortic regurgitation 12/02/2018   History of pulmonary embolism 08/20/2018   DOE (dyspnea on exertion)    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   Bilateral ovarian cysts    Hx of gallstones    Hernia, hiatal    Renal cysts, acquired, bilateral    GERD (gastroesophageal reflux disease)    Diverticulosis    Lumbar spinal stenosis    Diabetes mellitus without complication ([place])    Left knee DJD    Rapid palpitations 12/06/2013   Morbid obesity ([place]) 12/06/2013   Fatigue 12/06/2013   PVC (premature ventricular contraction) 12/06/2013   Essential hypertension    Hypothyroidism    Hyperlipidemia LDL goal <100      REFERRING PROVIDER: Koirala, Dibas, MD  REFERRING DIAG: L knee TKA/ R knee OA  THERAPY DIAG:  Muscle weakness (generalized)  Difficulty in walking, not elsewhere classified  Cramp and spasm  Abnormal posture  Rationale for Evaluation and Treatment: Rehabilitation  ONSET DATE: Ongoing for multiple years.   SUBJECTIVE:    SUBJECTIVE STATEMENT: Son arrives with patient today.  Patient states she is still not using the 3 in 1 bedside and that her son and dtr in law are upset with her.    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  PAIN:  Are you having pain? Yes: NPRS scale: 8 L knee/10 R knee/10 Pain location: R and L knee.  Pain description: Swelling, stabbing pain Aggravating factors: Standing, turning R knee.  Relieving factors: Rest, [place], heat.  PRECAUTIONS: Fall,  Monitor stroke symptoms.   WEIGHT BEARING RESTRICTIONS:  W/C bound due to pain and weakness.   FALLS:  Has patient fallen in last 6 months? Yes. Number of falls 3 in 24 hours. 6+ pt is unable to recall all.   LIVING ENVIRONMENT: Lives with:  Lives with her sister, but son lives a few houses down.  Lives in: House/apartment Stairs: Yes: Internal: 12 steps; on right going up and External: 1 steps; bilateral but cannot reach both Has following equipment at home: Single point cane, Quad cane large base, Walker - 2 wheeled, Walker - 4 wheeled, Shower bench, bed side commode, and Pt has a scooter.   OCCUPATION: Retired  PLOF: Independent  PATIENT GOALS: Pt would like to be able to walk without falling again.   NEXT MD VISIT:   OBJECTIVE:   DIAGNOSTIC FINDINGS: MRI brain (without) demonstrating: - Mild chronic small vessel ischemic disease. - No acute findings.  PATIENT SURVEYS:  FOTO Not taken today due to time restraint.   COGNITION: Overall cognitive status: Within functional limits for tasks assessed   SENSATION: Pt reports feeling her blood moving and her skin crawling upon waking.   EDEMA:  Circumferential: L:20", R:19.5"; R calf R: 16", L:17".    POSTURE:  Abnormal posture noted throughout body.   PALPATION: Pt reports pain in her L calf with palpation.   FOTO: 18.42%, 36% predicted   LOWER EXTREMITY ROM:  Active ROM Right eval Left eval  Hip  flexion 50%  25%   Knee flexion 110 100  Knee extension -30 P:-10 -32/ P:-25  Ankle dorsiflexion 50%  50%  Ankle plantarflexion WFL WFL   (Blank rows = not tested)  Pt was seated in a Rolator walker.   LOWER EXTREMITY MMT:  MMT Right eval Left eval  Hip flexion 4- 3  Knee flexion 4- 3+  Knee extension 4- 3+   (Blank rows = not tested)  Pt demonstrates decreased functional ROM of her L UE due to weakness and pain. She has a tendency to move in a synergetic pattern.   FUNCTIONAL TESTS:  5 times sit to stand: 42.57 sec   GAIT: Distance walked: Pt walks with multiple deficits with her gait. L LE foot drag, stooped posture, decreased knee extension, slow shuffled gait, ect.  Assistive device utilized: Environmental consultant - 2 wheeled Level of assistance: CGA and with WC following behind due to reports of knee's giving out and falling.  Comments: Pt reports fatigue.    TODAY'S TREATMENT:  12/25/2022: Nustep level 4 x 11 min (lengthy discussion re: home safety issue and fall prevention with regard to placing her 3 in 1 bedside at night) Gait training with rw x 50 feet , rested x 2 min, then another 30 feet emphasizing upright posture and knee extension, head up/chest up Patient states she is able to get up from the floor independently if she falls and that she "just can't stand the thought of the toilet by her bed".  We proceeded to have patient demonstrate from mat table with chair and foot stool on mat table to see if she was, in fact, able to get up on her own.  Patient was not able to even roll onto her stomach.      Reminded patient that [facility] should be by her bed at night Reviewed and updated HEP with patient and her son  12/18/2022: Nustep level 1, x 6 min Gait training with rw x 45 feet  Instructed in proper approach to  chair/sitting surface and then turning to safely sit by insuring both legs are touching the chair and then reaching back to lower herself safely.   Reminded patient that  [facility] should be by her bed at night Sit to stand x  5 emphasizing terminal knee extension and upright posture Seated toe and heel raises x 20 Seated LAQ 2 x 10 Seated march x 20 Seated hip ER 2 x 10 each Seated clam x 20  12/11/2022: Nustep level 1, x 6 min Gait training with rw x 40 feet  Instructed in proper approach to chair/sitting surface and then turning to safely sit by insuring both legs are touching the chair and then reaching back to lower herself safely.   Lengthy discussion ( approx 20 minutes) with patient and dtr regarding fall prevention in the home:  highly encouraged patient to use adult undergarment and to place 3 in 1 bedside at night, clear, well lit pathways and need for, at minimum, stand by assist during any ambulation Instructed patient in propping legs to assist with extension stretch Sit to stand x  5 emphasizing terminal knee extension and upright posture Attempted up and over hurdle x 10 each seated but patient could only do 3 reps on right and could not do left at all.  We switched to up and over 5 lb hand weight.  She was able to do 10 on each LE.   Worked on sit to stand using hands to push up, then transferring hands to walker safely and working on upright posture.                                                                                                                               PATIENT EDUCATION:  Education details: Educated pt on fall prevention and importance of setting up safe environment at home including using 3 in 1 bedside at night. Reviewed HEP with patient and son. Person educated: Patient and son Education method: Explanation and Demonstration Education comprehension: verbalized understanding, returned demonstration, and needs further education  HOME EXERCISE PROGRAM: Access Code: [email or web address] URL: [email or web address] Date: 12/25/2022 Prepared by: Candyce Churn  Exercises - Seated Long Arc Quad  - 1 x daily - 7 x  weekly - 2 sets - 10 reps - Seated March  - 1 x daily - 7 x weekly - 2 sets - 10 reps - Seated Hip Adduction Squeeze with Ball  - 1 x daily - 7 x weekly - 2 sets - 10 reps - 5 hold - Seated Hip Abduction with Resistance  - 1 x daily - 7 x weekly - 3 sets - 10 reps - Sit to Stand with Counter Support  - 1 x daily - 7 x weekly - 2 sets - 5 reps - Seated Knee Extension Stretch with Chair  - 1 x daily - 7 x weekly - 3 sets - 10 reps   ASSESSMENT:  CLINICAL IMPRESSION: [place] was able to  walk approx 5 feet further than last visit and then another 30 feet.  She is instructed to focus on upright posture but replies constantly with "I can't or my knees aren't straight" despite the instructions being "as tall as you can and look up" or "straighten your knees straight as they will go when taking a step".  These instructions are geared toward decreasing the fatigue in her Ue's that she complains about.  She is quite obstinate with safety instructions and her perceived disability is inaccurate.  She had mentioned that she has been able to get up from the floor by herself and she was no where near being able to demonstrate this today.  She has started wearing the adult undergarment which will cut down on her rushing to the bathroom.    Pt will continue to benefit from skilled PT to address continued deficits.   OBJECTIVE IMPAIRMENTS: decreased activity tolerance, difficulty walking, decreased balance, decreased endurance, decreased mobility, decreased ROM, decreased strength, impaired flexibility, impaired UE/LE use, postural dysfunction, and pain.  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  are also affecting patient's functional outcome.  REHAB POTENTIAL: Fair due to complex medical hx.   CLINICAL DECISION MAKING:  Unstable/unpredictable  EVALUATION COMPLEXITY: Low   GOALS: Short term PT Goals Target date: 01/08/2023 Pt will be I and compliant with HEP. Baseline:  Goal status: New Pt will decrease pain by 25% overall Baseline: Goal status: New  Long term PT goals Target date: 03/19/2023 Pt will improve ROM to [facility] to improve functional mobility in LE and UE.  Baseline: Goal status: New Pt will improve L hip/knee strength to at least 4/5 MMT to improve functional strength Baseline: Goal status: New Pt will improve FOTO to at least 36% functional to show improved function Baseline: Goal status: New Pt will reduce pain by overall 50% overall with usual activity Baseline: Goal status: New Pt will improve her STS by 2.3 seconds for Minimal clinically important difference.          Baseline:                Goal status: New  Pt will be able to ambulate community distances at least 1000 ft WNL gait pattern without complaints Baseline: Goal status: New        7. Pt will improve TUG by 3.4 seconds or Minimal clinically important difference.         Baseline:         Goal status: New   PLAN: PT FREQUENCY: 2 times per week   PT DURATION:  12 weeks  PLANNED INTERVENTIONS (unless contraindicated): aquatic PT, Canalith repositioning, cryotherapy, Electrical stimulation, Iontophoresis with 4 mg/ml dexamethasome, Moist heat, traction, Ultrasound, gait training, Therapeutic exercise, balance training, neuromuscular re-education, patient/family education, prosthetic training, manual techniques, passive ROM, dry needling, taping, vasopnuematic device, vestibular, spinal manipulations, joint manipulations  PLAN FOR NEXT SESSION:  Work on strength of UE/LE, balance, pain modulation and edema, fall prevention and home safety.    Anderson Malta B. Jossalyn Forgione, PT 12/25/22 8:44 PM  [facility] [address], [place], [place] [address] Phone # [phone] Fax [phone]

## 2022-12-27 ENCOUNTER — Ambulatory Visit: Payer: Medicare HMO

## 2023-01-01 ENCOUNTER — Ambulatory Visit: Payer: Medicare HMO | Attending: Family Medicine

## 2023-01-01 DIAGNOSIS — M6281 Muscle weakness (generalized): Secondary | ICD-10-CM | POA: Insufficient documentation

## 2023-01-01 DIAGNOSIS — R293 Abnormal posture: Secondary | ICD-10-CM | POA: Insufficient documentation

## 2023-01-01 DIAGNOSIS — R252 Cramp and spasm: Secondary | ICD-10-CM | POA: Insufficient documentation

## 2023-01-01 DIAGNOSIS — R262 Difficulty in walking, not elsewhere classified: Secondary | ICD-10-CM | POA: Diagnosis present

## 2023-01-01 DIAGNOSIS — M5459 Other low back pain: Secondary | ICD-10-CM | POA: Diagnosis present

## 2023-01-01 NOTE — Therapy (Signed)
OUTPATIENT PHYSICAL THERAPY LOWER EXTREMITY TREATMENT NOTE   Patient Name: Tricia Harrison MRN: [id number] DOB:09/17/1946, 77 y.o., female Today's Date: 01/01/2023  END OF SESSION:  PT End of Session - 01/01/23 1234     Visit Number 6    Number of Visits 24    Date for PT Re-Evaluation 03/05/23    Authorization Type [employer] MEDICARE HMO/PPO    Progress Note Due on Visit 10    PT Start Time 1230    PT Stop Time 1315    PT Time Calculation (min) 45 min    Activity Tolerance Patient limited by pain    Behavior During Therapy WFL for tasks assessed/performed              Past Medical History:  Diagnosis Date   Anemia    as a child   Asthma    as a child   Chronic back pain    spinal stenosis and buldging disc;scoliosis   Chronic constipation    occasionally takes something OTC   Chronic kidney disease    Diabetes mellitus without complication ([place])    per pt "Pre" for 20+yrs   Diverticulosis    DVT (deep venous thrombosis) ([facility])    Gallstones    has known about this for 3-11yr   GERD (gastroesophageal reflux disease)    takes Omeprazole daily   H/O hiatal hernia    Heart murmur    Hemorrhoids    History of blood transfusion    no abnormal reaction noted   History of bronchitis    states its been a long time ago   History of gout    HLD (hyperlipidemia)    takes Fenofibrate daily   HTN (hypertension)    takes Diltiazem and Ramipril daily   Hypothyroidism    takes Synthroid daily   Left knee DJD    Nocturia    Palpitations    started in 2013 and only occasionally;last time noticed about 2-3wks ago and after drinking caffeine   PONV (postoperative nausea and vomiting)    Pulmonary [place]ale [place] 07[place]   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   Past Surgical History:  Procedure Laterality Date   3-day EVENT MONITOR  01/2019   Mostly normal monitor.   Predominantly sinus rhythm (rate range 48-105 bpm, average 67 bpm) 9 short atrial runs (fastest = 13 beats rate 135 bpm, longest ~17 seconds-101 bpm) -> not noted on symptom log.  No sustained arrhythmias (tachycardia or bradycardia), or pauses..  Symptoms are noted with PVCs.   CHOLECYSTECTOMY N/A 01/10/2022   Procedure: LAPAROSCOPIC CHOLECYSTECTOMY WITH INTRAOPERATIVE CHOLANGIOGRAM;  Surgeon: GMichael Boston MD;  Location: [facility];  Service: General;  Laterality: N/A;   COLONOSCOPY     CT ANGIOGRAM OF CHEST - PE PROTOCOL  07/2018   Bilateral nonocclusive pulmonary emboli evidence of right heart strain consistent with "submassive "/intermediate PE.  -->  Code PE called.   DILATION AND CURETTAGE OF UTERUS     multiple about 6-7 times   ESOPHAGOGASTRODUODENOSCOPY     NM VQ LUNG SCAN ([facility] HX)  11/23/2018   Low probability for PE   RIGHT/LEFT HEART CATH AND CORONARY ANGIOGRAPHY N/A 08/19/2018   Procedure: RIGHT/LEFT HEART CATH AND CORONARY ANGIOGRAPHY;  Surgeon: VJettie Booze MD;  Location: [facility];  Service: Cardiovascular:: Nonobstructive CAD.  Mild pulmonary pretension.  Mean PA pressure  26 mmHg with PA P 48/20 mmHg.  Normal LVEDP.   THYROID SURGERY Right 2010   TOTAL HIP ARTHROPLASTY Left 2005   TOTAL KNEE ARTHROPLASTY Left 02/21/2014   Procedure: TOTAL KNEE ARTHROPLASTY;  Surgeon: Lorn Junes, MD;  Location: [facility];  Service: Orthopedics;  Laterality: Left;   TRANSTHORACIC ECHOCARDIOGRAM  07/2018    (In setting of acute PE) mild LVH.  EF 55 to 60%.  GR 1 DD.  Aortic sclerosis but no stenosis.  Mild regurgitation.  Mild RA dilation.  Moderate LA dilation.  Moderate tricuspid regurgitation with severe primary hypertension estimated PA pressure 71 mmHg.  RV poorly visualized   TRANSTHORACIC ECHOCARDIOGRAM  10/2018 - 11/2019   a) EF 60-65%.  GR 1 DD.  Focal basal LVH. Mod AI/ no AS. Mild LA dilation.  Normal RV size and fxn.  Mod PI w/ mild TR.  Peak PAP ~ 38 mmHg; b) Normal EF 60  to 65%.  Moderate LVH-GR 1 DD.  R WMA.  Mild AS w/ trivial AI.  Grossly normal PA, RV and RA size.  Normal RV function.  Normal PA pressures.   TRANSTHORACIC ECHOCARDIOGRAM  01/2021    EF 55%.  Normal LV function.  Mild LVH.  GR 1 DD.  Normal PA pressures.  Severe LA dilation.  (Consistent with more significant diastolic dysfunction).  Mild-possibly mild to moderate aortic insufficiency.  Borderline elevated RAP.  Trivial pericardial effusion.  (Stable)   VAGINAL HYSTERECTOMY  1979   Patient Active Problem List   Diagnosis Date Noted   Uses wheelchair 01/14/2022   Velopharyngeal dysfunction with mild swallowing difficulty 01/14/2022   Uses roller walker 01/11/2022   Atherosclerosis of aorta ([place]) 01/11/2022   Chronic kidney disease, stage 3b ([place]) 01/11/2022   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   Orthostasis 01/11/2022   Leukocytosis 01/11/2022   Chronic cholecystitis 01/10/2022   Obstructive sleep apnea 03/09/2019   Aortic regurgitation 12/02/2018   History of pulmonary embolism 08/20/2018   DOE (dyspnea on exertion)    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   Bilateral ovarian cysts    Hx of gallstones    Hernia, hiatal    Renal cysts, acquired, bilateral    GERD (gastroesophageal reflux disease)    Diverticulosis    Lumbar spinal stenosis    Diabetes mellitus without complication ([place])    Left knee DJD    Rapid palpitations 12/06/2013   Morbid obesity ([place]) 12/06/2013   Fatigue 12/06/2013   PVC (premature ventricular contraction) 12/06/2013   Essential hypertension    Hypothyroidism    Hyperlipidemia LDL goal <100      REFERRING PROVIDER: Koirala, Dibas, MD  REFERRING DIAG: L knee TKA/ R knee OA  THERAPY DIAG:  Muscle weakness (generalized)  Difficulty in walking, not elsewhere classified  Cramp and spasm  Rationale for Evaluation and Treatment: Rehabilitation  ONSET DATE: Ongoing for multiple years.   SUBJECTIVE:   SUBJECTIVE  STATEMENT: Son arrives with patient today.  Patient states she is still not using the 3 in 1 bedside and that her son and dtr in law are upset with her.    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  PAIN:  Are you having pain? Yes: NPRS scale: 8 L knee/10 R knee/10 Pain location: R and L knee.  Pain description: Swelling, stabbing pain Aggravating factors: Standing, turning R knee.  Relieving factors: Rest, [place], heat.  PRECAUTIONS: Fall, Monitor stroke symptoms.  WEIGHT BEARING RESTRICTIONS:  W/C bound due to pain and weakness.   FALLS:  Has patient fallen in last 6 months? Yes. Number of falls 3 in 24 hours. 6+ pt is unable to recall all.   LIVING ENVIRONMENT: Lives with:  Lives with her sister, but son lives a few houses down.  Lives in: House/apartment Stairs: Yes: Internal: 12 steps; on right going up and External: 1 steps; bilateral but cannot reach both Has following equipment at home: Single point cane, Quad cane large base, Walker - 2 wheeled, Walker - 4 wheeled, Shower bench, bed side commode, and Pt has a scooter.   OCCUPATION: Retired  PLOF: Independent  PATIENT GOALS: Pt would like to be able to walk without falling again.   NEXT MD VISIT:   OBJECTIVE:   DIAGNOSTIC FINDINGS: MRI brain (without) demonstrating: - Mild chronic small vessel ischemic disease. - No acute findings.  PATIENT SURVEYS:  FOTO Not taken today due to time restraint.   COGNITION: Overall cognitive status: Within functional limits for tasks assessed   SENSATION: Pt reports feeling her blood moving and her skin crawling upon waking.   EDEMA:  Circumferential: L:20", R:19.5"; R calf R: 16", L:17".    POSTURE:  Abnormal posture noted throughout body.   PALPATION: Pt reports pain in her L calf with palpation.   FOTO: 18.42%, 36% predicted   LOWER EXTREMITY ROM:  Active ROM Right eval Left eval  Hip flexion 50%  25%    Knee flexion 110 100  Knee extension -30 P:-10 -32/ P:-25  Ankle dorsiflexion 50%  50%  Ankle plantarflexion WFL WFL   (Blank rows = not tested)  Pt was seated in a Rolator walker.   LOWER EXTREMITY MMT:  MMT Right eval Left eval  Hip flexion 4- 3  Knee flexion 4- 3+  Knee extension 4- 3+   (Blank rows = not tested)  Pt demonstrates decreased functional ROM of her L UE due to weakness and pain. She has a tendency to move in a synergetic pattern.   FUNCTIONAL TESTS:  5 times sit to stand: 42.57 sec   GAIT: Distance walked: Pt walks with multiple deficits with her gait. L LE foot drag, stooped posture, decreased knee extension, slow shuffled gait, ect.  Assistive device utilized: Environmental consultant - 2 wheeled Level of assistance: CGA and with WC following behind due to reports of knee's giving out and falling.  Comments: Pt reports fatigue.    TODAY'S TREATMENT:  01/01/23: Nustep x 8 min level 3  Seated leg extension 0# 2 x 10 each LE Gait training with RW 2 x 60 ft (heavy vc's for upright posture, knee extension and heel strike)  Needed several rest breaks to relieve arm pain.   Lengthy discussion with patient regarding when PT gives vc's, we are encouraging more appropriate technique to allow increased ease in her walking and that if patient constantly repeats, "I can't do that or that's just how I am" it will appear that we are not working toward a goal of improving to be able to reach the goal of safe, independent walking.  Encouraged patient to change her mindset to "I will try" vs responding negatively.    12/25/2022: Nustep level 4 x 11 min (lengthy discussion re: home safety issue and fall prevention with regard to placing her 3 in 1 bedside at night) Gait training with rw x 50 feet , rested x 2 min, then another 30 feet emphasizing upright posture and knee extension, head  up/chest up Patient states she is able to get up from the floor independently if she falls and that she "just  can't stand the thought of the toilet by her bed".  We proceeded to have patient demonstrate from mat table with chair and foot stool on mat table to see if she was, in fact, able to get up on her own.  Patient was not able to even roll onto her stomach.      Reminded patient that [facility] should be by her bed at night Reviewed and updated HEP with patient and her son  12/18/2022: Nustep level 1, x 6 min Gait training with rw x 45 feet  Instructed in proper approach to chair/sitting surface and then turning to safely sit by insuring both legs are touching the chair and then reaching back to lower herself safely.   Reminded patient that [facility] should be by her bed at night Sit to stand x  5 emphasizing terminal knee extension and upright posture Seated toe and heel raises x 20 Seated LAQ 2 x 10 Seated march x 20 Seated hip ER 2 x 10 each Seated clam x 20                                                                                                                          PATIENT EDUCATION:  Education details: Educated pt on fall prevention and importance of setting up safe environment at home including using 3 in 1 bedside at night. Reviewed HEP with patient and son. Person educated: Patient and son Education method: Explanation and Demonstration Education comprehension: verbalized understanding, returned demonstration, and needs further education  HOME EXERCISE PROGRAM: Access Code: [email or web address] URL: [email or web address] Date: 12/25/2022 Prepared by: Candyce Churn  Exercises - Seated Long Arc Quad  - 1 x daily - 7 x weekly - 2 sets - 10 reps - Seated March  - 1 x daily - 7 x weekly - 2 sets - 10 reps - Seated Hip Adduction Squeeze with Ball  - 1 x daily - 7 x weekly - 2 sets - 10 reps - 5 hold - Seated Hip Abduction with Resistance  - 1 x daily - 7 x weekly - 3 sets - 10 reps - Sit to Stand with Counter Support  - 1 x daily - 7 x weekly - 2 sets - 5 reps - Seated Knee  Extension Stretch with Chair  - 1 x daily - 7 x weekly - 3 sets - 10 reps   ASSESSMENT:  CLINICAL IMPRESSION: Tyson was able to walk approx 60 feet today when we took several standing rest breaks.  She is refusing to put the 3 in 1 beside her bed at night and will likely fall again due to doing this independently at night.  Family has someone staying with her at night but the patient does not call for help when she is going to/from bathroom.   Pt will continue to benefit from skilled PT  to address continued deficits.   OBJECTIVE IMPAIRMENTS: decreased activity tolerance, difficulty walking, decreased balance, decreased endurance, decreased mobility, decreased ROM, decreased strength, impaired flexibility, impaired UE/LE use, postural dysfunction, and pain.  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  are also affecting patient's functional outcome.  REHAB POTENTIAL: Fair due to complex medical hx.   CLINICAL DECISION MAKING: Unstable/unpredictable  EVALUATION COMPLEXITY: Low   GOALS: Short term PT Goals Target date: 01/15/2023 Pt will be I and compliant with HEP. Baseline:  Goal status: New Pt will decrease pain by 25% overall Baseline: Goal status: New  Long term PT goals Target date: 03/26/2023 Pt will improve ROM to [facility] to improve functional mobility in LE and UE.  Baseline: Goal status: New Pt will improve L hip/knee strength to at least 4/5 MMT to improve functional strength Baseline: Goal status: New Pt will improve FOTO to at least 36% functional to show improved function Baseline: Goal status: New Pt will reduce pain by overall 50% overall with usual activity Baseline: Goal status: New Pt will improve her STS by 2.3 seconds for Minimal clinically important difference.          Baseline:                 Goal status: New  Pt will be able to ambulate community distances at least 1000 ft WNL gait pattern without complaints Baseline: Goal status: New        7. Pt will improve TUG by 3.4 seconds or Minimal clinically important difference.         Baseline:         Goal status: New   PLAN: PT FREQUENCY: 2 times per week   PT DURATION:  12 weeks  PLANNED INTERVENTIONS (unless contraindicated): aquatic PT, Canalith repositioning, cryotherapy, Electrical stimulation, Iontophoresis with 4 mg/ml dexamethasome, Moist heat, traction, Ultrasound, gait training, Therapeutic exercise, balance training, neuromuscular re-education, patient/family education, prosthetic training, manual techniques, passive ROM, dry needling, taping, vasopnuematic device, vestibular, spinal manipulations, joint manipulations  PLAN FOR NEXT SESSION:  Work on strength of UE/LE, balance, pain modulation and edema, fall prevention and home safety.    Anderson Malta B. Raven Harmes, PT 01/01/23 1:32 PM  [facility] [address] [place], [place] [address] Phone # [phone] Fax [phone]

## 2023-01-03 ENCOUNTER — Ambulatory Visit: Payer: Medicare HMO

## 2023-01-03 DIAGNOSIS — M6281 Muscle weakness (generalized): Secondary | ICD-10-CM | POA: Diagnosis not present

## 2023-01-03 DIAGNOSIS — M5459 Other low back pain: Secondary | ICD-10-CM

## 2023-01-03 DIAGNOSIS — R293 Abnormal posture: Secondary | ICD-10-CM

## 2023-01-03 DIAGNOSIS — R262 Difficulty in walking, not elsewhere classified: Secondary | ICD-10-CM

## 2023-01-03 DIAGNOSIS — R252 Cramp and spasm: Secondary | ICD-10-CM

## 2023-01-03 NOTE — Therapy (Signed)
OUTPATIENT PHYSICAL THERAPY LOWER EXTREMITY TREATMENT NOTE   Patient Name: Tricia Harrison MRN: [id number] DOB:[birth year], 77 y.o., female Today's Date: 01/03/2023  END OF SESSION:  PT End of Session - 01/03/23 0945     Visit Number 7    Number of Visits 24    Date for PT Re-Evaluation 03/05/23    Authorization Type [employer] MEDICARE HMO/PPO    Progress Note Due on Visit 10    PT Start Time 0930    PT Stop Time 1015    PT Time Calculation (min) 45 min    Activity Tolerance Patient limited by pain    Behavior During Therapy WFL for tasks assessed/performed              Past Medical History:  Diagnosis Date   Anemia    as a child   Asthma    as a child   Chronic back pain    spinal stenosis and buldging disc;scoliosis   Chronic constipation    occasionally takes something OTC   Chronic kidney disease    Diabetes mellitus without complication ([place])    per pt "Pre" for 20+yrs   Diverticulosis    DVT (deep venous thrombosis) (HCC)    Gallstones    has known about this for 3-38yr   GERD (gastroesophageal reflux disease)    takes Omeprazole daily   H/O hiatal hernia    Heart murmur    Hemorrhoids    History of blood transfusion    no abnormal reaction noted   History of bronchitis    states its been a long time ago   History of gout    HLD (hyperlipidemia)    takes Fenofibrate daily   HTN (hypertension)    takes Diltiazem and Ramipril daily   Hypothyroidism    takes Synthroid daily   Left knee DJD    Nocturia    Palpitations    started in 2013 and only occasionally;last time noticed about 2-3wks ago and after drinking caffeine   PONV (postoperative nausea and vomiting)    [place]ulmonary [place]   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   Past Surgical History:  Procedure Laterality Date   3-day EVENT MONITOR  01/2019   Mostly normal monitor.   Predominantly sinus rhythm (rate range 48-105 bpm, average 67 bpm) 9 short atrial runs (fastest = 13 beats rate 135 bpm, longest ~17 seconds-101 bpm) -> not noted on symptom log.  No sustained arrhythmias (tachycardia or bradycardia), or pauses..  Symptoms are noted with PVCs.   CHOLECYSTECTOMY N/A 01/10/2022   Procedure: LAPAROSCOPIC CHOLECYSTECTOMY WITH INTRAOPERATIVE CHOLANGIOGRAM;  Surgeon: GMichael Boston MD;  Location: [facility];  Service: General;  Laterality: N/A;   COLONOSCOPY     CT ANGIOGRAM OF CHEST - PE PROTOCOL  07/2018   Bilateral nonocclusive pulmonary emboli evidence of right heart strain consistent with "submassive "/intermediate PE.  -->  Code PE called.   DILATION AND CURETTAGE OF UTERUS     multiple about 6-7 times   ESOPHAGOGASTRODUODENOSCOPY     NM VQ LUNG SCAN ([facility] HX)  11/23/2018   Low probability for PE   RIGHT/LEFT HEART CATH AND CORONARY ANGIOGRAPHY N/A 08/19/2018   Procedure: RIGHT/LEFT HEART CATH AND CORONARY ANGIOGRAPHY;  Surgeon: VJettie Booze MD;  [facility];  Service: Cardiovascular:: Nonobstructive CAD.  Mild pulmonary pretension.  Mean PA pressure  26 mmHg with PA P 48/20 mmHg.  Normal LVEDP.   THYROID SURGERY Right 2010   TOTAL HIP ARTHROPLASTY Left 2005   TOTAL KNEE ARTHROPLASTY Left 02/21/2014   Procedure: TOTAL KNEE ARTHROPLASTY;  Surgeon: Lorn Junes, MD;  Location: [place];  Service: Orthopedics;  Laterality: Left;   TRANSTHORACIC ECHOCARDIOGRAM  07/2018    (In setting of acute PE) mild LVH.  EF 55 to 60%.  GR 1 DD.  Aortic sclerosis but no stenosis.  Mild regurgitation.  Mild RA dilation.  Moderate LA dilation.  Moderate tricuspid regurgitation with severe primary hypertension estimated PA pressure 71 mmHg.  RV poorly visualized   TRANSTHORACIC ECHOCARDIOGRAM  10/2018 - 11/2019   a) EF 60-65%.  GR 1 DD.  Focal basal LVH. Mod AI/ no AS. Mild LA dilation.  Normal RV size and fxn.  Mod PI w/ mild TR.  Peak PAP ~ 38 mmHg; b) Normal EF 60  to 65%.  Moderate LVH-GR 1 DD.  R WMA.  Mild AS w/ trivial AI.  Grossly normal PA, RV and RA size.  Normal RV function.  Normal PA pressures.   TRANSTHORACIC ECHOCARDIOGRAM  01/2021    EF 55%.  Normal LV function.  Mild LVH.  GR 1 DD.  Normal PA pressures.  Severe LA dilation.  (Consistent with more significant diastolic dysfunction).  Mild-possibly mild to moderate aortic insufficiency.  Borderline elevated RAP.  Trivial pericardial effusion.  (Stable)   VAGINAL HYSTERECTOMY  1979   Patient Active Problem List   Diagnosis Date Noted   Uses wheelchair 01/14/2022   Velopharyngeal dysfunction with mild swallowing difficulty 01/14/2022   Uses roller walker 01/11/2022   Atherosclerosis of aorta ([place]) 01/11/2022   Chronic kidney disease, stage 3b ([place]) 01/11/2022   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   Orthostasis 01/11/2022   Leukocytosis 01/11/2022   Chronic cholecystitis 01/10/2022   Obstructive sleep apnea 03/09/2019   Aortic regurgitation 12/02/2018   History of pulmonary embolism 08/20/2018   DOE (dyspnea on exertion)    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   Bilateral ovarian cysts    Hx of gallstones    Hernia, hiatal    Renal cysts, acquired, bilateral    GERD (gastroesophageal reflux disease)    Diverticulosis    Lumbar spinal stenosis    Diabetes mellitus without complication ([place])    Left knee DJD    Rapid palpitations 12/06/2013   Morbid obesity ([place]) 12/06/2013   Fatigue 12/06/2013   PVC (premature ventricular contraction) 12/06/2013   Essential hypertension    Hypothyroidism    Hyperlipidemia LDL goal <100      REFERRING PROVIDER: Koirala, Dibas, MD  REFERRING DIAG: L knee TKA/ R knee OA  THERAPY DIAG:  Muscle weakness (generalized)  Difficulty in walking, not elsewhere classified  Cramp and spasm  Abnormal posture  Other low back pain  Rationale for Evaluation and Treatment: Rehabilitation  ONSET DATE: Ongoing for multiple years.    SUBJECTIVE:   SUBJECTIVE STATEMENT: Sister arrives with patient today.  Patient denies any falls in the last week.      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  PAIN:  Are you having pain? Yes: NPRS scale: 8 L knee/10 R knee/10 Pain location: R and L knee.  Pain description: Swelling, stabbing pain Aggravating factors: Standing, turning R knee.  Relieving factors: Rest, [place], heat.  PRECAUTIONS: Fall, Monitor stroke symptoms.   WEIGHT BEARING RESTRICTIONS:  W/C bound due to pain and weakness.   FALLS:  Has patient fallen in last 6 months? Yes. Number of falls 3 in 24 hours. 6+ pt is unable to recall all.   LIVING ENVIRONMENT: Lives with:  Lives with her sister, but son lives a few houses down.  Lives in: House/apartment Stairs: Yes: Internal: 12 steps; on right going up and External: 1 steps; bilateral but cannot reach both Has following equipment at home: Single point cane, Quad cane large base, Walker - 2 wheeled, Walker - 4 wheeled, Shower bench, bed side commode, and Pt has a scooter.   OCCUPATION: Retired  PLOF: Independent  PATIENT GOALS: Pt would like to be able to walk without falling again.   NEXT MD VISIT:   OBJECTIVE:   DIAGNOSTIC FINDINGS: MRI brain (without) demonstrating: - Mild chronic small vessel ischemic disease. - No acute findings.  PATIENT SURVEYS:  FOTO Not taken today due to time restraint.   COGNITION: Overall cognitive status: Within functional limits for tasks assessed   SENSATION: Pt reports feeling her blood moving and her skin crawling upon waking.   EDEMA:  Circumferential: L:20", R:19.5"; R calf R: 16", L:17".    POSTURE:  Abnormal posture noted throughout body.   PALPATION: Pt reports pain in her L calf with palpation.   FOTO: 18.42%, 36% predicted   LOWER EXTREMITY ROM:  Active ROM Right eval Left eval  Hip flexion 50%  25%   Knee flexion 110 100  Knee  extension -30 P:-10 -32/ P:-25  Ankle dorsiflexion 50%  50%  Ankle plantarflexion WFL WFL   (Blank rows = not tested)  Pt was seated in a Rolator walker.   LOWER EXTREMITY MMT:  MMT Right eval Left eval  Hip flexion 4- 3  Knee flexion 4- 3+  Knee extension 4- 3+   (Blank rows = not tested)  Pt demonstrates decreased functional ROM of her L UE due to weakness and pain. She has a tendency to move in a synergetic pattern.   FUNCTIONAL TESTS:  5 times sit to stand: 42.57 sec   GAIT: Distance walked: Pt walks with multiple deficits with her gait. L LE foot drag, stooped posture, decreased knee extension, slow shuffled gait, ect.  Assistive device utilized: Environmental consultant - 2 wheeled Level of assistance: CGA and with WC following behind due to reports of knee's giving out and falling.  Comments: Pt reports fatigue.    TODAY'S TREATMENT:  01/03/23: Nustep x 10 min level 3  Seated toe and heel raises x 20 Seated up and over hurdle 0# x 10 each LE (could not do hurdle on left, placed 2 stacked 2lb ankle weight and had patient to up and over these) Seated leg extension 3# 2 x 10 each LE Seated march with 3# x 20 Seated hip ER 2 x 10 with 3# Seated with foot on footstool, quad sets x 20 each LE Sit to stand x 5 with resistance loop behind knees to encourage terminal knee extension (walker placed in front of patient) Standing TKE 2 x 10 with purple resistance loop Gait training with rw x 30 feet : emphasis on elongating posture and knee extension along with heel strike.    01/01/23: Nustep x 8 min level 3  Seated leg extension 0# 2 x 10 each LE Gait training with RW 2 x 60 ft (heavy vc's for upright posture, knee extension and heel strike)  Needed several rest breaks to relieve arm pain.   Lengthy discussion  with patient regarding when PT gives vc's, we are encouraging more appropriate technique to allow increased ease in her walking and that if patient constantly repeats, "I can't do that or  that's just how I am" it will appear that we are not working toward a goal of improving to be able to reach the goal of safe, independent walking.  Encouraged patient to change her mindset to "I will try" vs responding negatively.    12/25/2022: Nustep level 4 x 11 min (lengthy discussion re: home safety issue and fall prevention with regard to placing her 3 in 1 bedside at night) Gait training with rw x 50 feet , rested x 2 min, then another 30 feet emphasizing upright posture and knee extension, head up/chest up Patient states she is able to get up from the floor independently if she falls and that she "just can't stand the thought of the toilet by her bed".  We proceeded to have patient demonstrate from mat table with chair and foot stool on mat table to see if she was, in fact, able to get up on her own.  Patient was not able to even roll onto her stomach.      Reminded patient that [facility] should be by her bed at night Reviewed and updated HEP with patient and her son                                                                    PATIENT EDUCATION:  Education details: Educated pt on fall prevention and importance of setting up safe environment at home including using 3 in 1 bedside at night. Reviewed HEP with patient and son. Person educated: Patient and son Education method: Explanation and Demonstration Education comprehension: verbalized understanding, returned demonstration, and needs further education  HOME EXERCISE PROGRAM: Access Code: [email or web address] URL: [email or web address] Date: 12/25/2022 Prepared by: Candyce Churn  Exercises - Seated Long Arc Quad  - 1 x daily - 7 x weekly - 2 sets - 10 reps - Seated March  - 1 x daily - 7 x weekly - 2 sets - 10 reps - Seated Hip Adduction Squeeze with Ball  - 1 x daily - 7 x weekly - 2 sets - 10 reps - 5 hold - Seated Hip Abduction with Resistance  - 1 x daily - 7 x weekly - 3 sets - 10 reps - Sit to Stand with Counter  Support  - 1 x daily - 7 x weekly - 2 sets - 5 reps - Seated Knee Extension Stretch with Chair  - 1 x daily - 7 x weekly - 3 sets - 10 reps   ASSESSMENT:  CLINICAL IMPRESSION: Rickiyah demonstrated improved posture and stamina today with all tasks.  She was able to demonstrate quad contraction at increased extension range.  She was more upright during ambulation and performed sit to stand with increased ease.     Pt will continue to benefit from skilled PT to address continued deficits.   OBJECTIVE IMPAIRMENTS: decreased activity tolerance, difficulty walking, decreased balance, decreased endurance, decreased mobility, decreased ROM, decreased strength, impaired flexibility, impaired UE/LE use, postural dysfunction, and pain.  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  are also affecting patient's functional outcome.  REHAB POTENTIAL: Fair due to complex medical hx.   CLINICAL DECISION MAKING: Unstable/unpredictable  EVALUATION COMPLEXITY: Low   GOALS: Short term PT Goals Target date: 01/17/2023 Pt will be I and compliant with HEP. Baseline:  Goal status: New Pt will decrease pain by 25% overall Baseline: Goal status: New  Long term PT goals Target date: 03/28/2023 Pt will improve ROM to [facility] to improve functional mobility in LE and UE.  Baseline: Goal status: New Pt will improve L hip/knee strength to at least 4/5 MMT to improve functional strength Baseline: Goal status: New Pt will improve FOTO to at least 36% functional to show improved function Baseline: Goal status: New Pt will reduce pain by overall 50% overall with usual activity Baseline: Goal status: New Pt will improve her STS by 2.3 seconds for Minimal clinically important difference.          Baseline:                Goal status: New  Pt will be  able to ambulate community distances at least 1000 ft WNL gait pattern without complaints Baseline: Goal status: New        7. Pt will improve TUG by 3.4 seconds or Minimal clinically important difference.         Baseline:         Goal status: New   PLAN: PT FREQUENCY: 2 times per week   PT DURATION:  12 weeks  PLANNED INTERVENTIONS (unless contraindicated): aquatic PT, Canalith repositioning, cryotherapy, Electrical stimulation, Iontophoresis with 4 mg/ml dexamethasome, Moist heat, traction, Ultrasound, gait training, Therapeutic exercise, balance training, neuromuscular re-education, patient/family education, prosthetic training, manual techniques, passive ROM, dry needling, taping, vasopnuematic device, vestibular, spinal manipulations, joint manipulations  PLAN FOR NEXT SESSION:  Work on strength of UE/LE, balance, pain modulation and edema, fall prevention and home safety.    Anderson Malta B. Prentiss Polio, PT 01/03/23 11:13 AM  [facility] [address], [place], [place] [address] Phone # [phone] Fax [phone]

## 2023-01-08 ENCOUNTER — Ambulatory Visit: Payer: Medicare HMO

## 2023-01-08 DIAGNOSIS — M5459 Other low back pain: Secondary | ICD-10-CM

## 2023-01-08 DIAGNOSIS — R293 Abnormal posture: Secondary | ICD-10-CM

## 2023-01-08 DIAGNOSIS — M6281 Muscle weakness (generalized): Secondary | ICD-10-CM | POA: Diagnosis not present

## 2023-01-08 DIAGNOSIS — R262 Difficulty in walking, not elsewhere classified: Secondary | ICD-10-CM

## 2023-01-08 DIAGNOSIS — R252 Cramp and spasm: Secondary | ICD-10-CM

## 2023-01-08 NOTE — Therapy (Signed)
OUTPATIENT PHYSICAL THERAPY LOWER EXTREMITY TREATMENT NOTE   Patient Name: Tricia Harrison MRN: [id number] DOB:16-Oct-1946, 77 y.o., female Today's Date: 01/08/2023  END OF SESSION:  PT End of Session - 01/08/23 1612     Visit Number 8    Number of Visits 24    Date for PT Re-Evaluation 03/05/23    Authorization Type [employer] MEDICARE HMO/PPO    Progress Note Due on Visit 10    PT Start Time 0930    PT Stop Time 1015    PT Time Calculation (min) 45 min    Activity Tolerance Patient limited by pain    Behavior During Therapy WFL for tasks assessed/performed              Past Medical History:  Diagnosis Date   Anemia    as a child   Asthma    as a child   Chronic back pain    spinal stenosis and buldging disc;scoliosis   Chronic constipation    occasionally takes something OTC   Chronic kidney disease    Diabetes mellitus without complication ([place])    per pt "Pre" for 20+yrs   Diverticulosis    DVT (deep venous thrombosis) ([facility])    Gallstones    has known about this for 3-24yr   GERD (gastroesophageal reflux disease)    takes Omeprazole daily   H/O hiatal hernia    Heart murmur    Hemorrhoids    History of blood transfusion    no abnormal reaction noted   History of bronchitis    states its been a long time ago   History of gout    HLD (hyperlipidemia)    takes Fenofibrate daily   HTN (hypertension)    takes Diltiazem and Ramipril daily   Hypothyroidism    takes Synthroid daily   Left knee DJD    Nocturia    Palpitations    started in 2013 and only occasionally;last time noticed about 2-3wks ago and after drinking caffeine   PONV (postoperative nausea and vomiting)    Pulmonary embolism with acute cor pulmonale ([place] 07/2018   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   Past Surgical History:  Procedure Laterality Date   3-day EVENT MONITOR  01/2019   Mostly normal monitor.   Predominantly sinus rhythm (rate range 48-105 bpm, average 67 bpm) 9 short atrial runs (fastest = 13 beats rate 135 bpm, longest ~17 seconds-101 bpm) -> not noted on symptom log.  No sustained arrhythmias (tachycardia or bradycardia), or pauses..  Symptoms are noted with PVCs.   CHOLECYSTECTOMY N/A 01/10/2022   Procedure: LAPAROSCOPIC CHOLECYSTECTOMY WITH INTRAOPERATIVE CHOLANGIOGRAM;  Surgeon: GMichael Boston MD;  Location: [facility];  Service: General;  Laterality: N/A;   COLONOSCOPY     CT ANGIOGRAM OF CHEST - PE PROTOCOL  07/2018   Bilateral nonocclusive pulmonary emboli evidence of right heart strain consistent with "submassive "/intermediate PE.  -->  Code PE called.   DILATION AND CURETTAGE OF UTERUS     multiple about 6-7 times   ESOPHAGOGASTRODUODENOSCOPY     NM VQ LUNG SCAN ([facility] HX)  11/23/2018   Low probability for PE   RIGHT/LEFT HEART CATH AND CORONARY ANGIOGRAPHY N/A 08/19/2018   Procedure: RIGHT/LEFT HEART CATH AND CORONARY ANGIOGRAPHY;  Surgeon: VJettie Booze MD;  [facility];  Service: Cardiovascular:: Nonobstructive CAD.  Mild pulmonary pretension.  Mean PA pressure  26 mmHg with PA P 48/20 mmHg.  Normal LVEDP.   THYROID SURGERY Right 2010   TOTAL HIP ARTHROPLASTY Left 2005   TOTAL KNEE ARTHROPLASTY Left 02/21/2014   Procedure: TOTAL KNEE ARTHROPLASTY;  Surgeon: Lorn Junes, MD;  Location: [facility];  Service: Orthopedics;  Laterality: Left;   TRANSTHORACIC ECHOCARDIOGRAM  07/2018    (In setting of acute PE) mild LVH.  EF 55 to 60%.  GR 1 DD.  Aortic sclerosis but no stenosis.  Mild regurgitation.  Mild RA dilation.  Moderate LA dilation.  Moderate tricuspid regurgitation with severe primary hypertension estimated PA pressure 71 mmHg.  RV poorly visualized   TRANSTHORACIC ECHOCARDIOGRAM  10/2018 - 11/2019   a) EF 60-65%.  GR 1 DD.  Focal basal LVH. Mod AI/ no AS. Mild LA dilation.  Normal RV size and fxn.  Mod PI w/ mild TR.  Peak PAP ~ 38 mmHg; b) Normal EF 60  to 65%.  Moderate LVH-GR 1 DD.  R WMA.  Mild AS w/ trivial AI.  Grossly normal PA, RV and RA size.  Normal RV function.  Normal PA pressures.   TRANSTHORACIC ECHOCARDIOGRAM  01/2021    EF 55%.  Normal LV function.  Mild LVH.  GR 1 DD.  Normal PA pressures.  Severe LA dilation.  (Consistent with more significant diastolic dysfunction).  Mild-possibly mild to moderate aortic insufficiency.  Borderline elevated RAP.  Trivial pericardial effusion.  (Stable)   VAGINAL HYSTERECTOMY  1979   Patient Active Problem List   Diagnosis Date Noted   Uses wheelchair 01/14/2022   Velopharyngeal dysfunction with mild swallowing difficulty 01/14/2022   Uses roller walker 01/11/2022   Atherosclerosis of aorta ([place]) 01/11/2022   Chronic kidney disease, stage 3b ([place]) 01/11/2022   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   Orthostasis 01/11/2022   Leukocytosis 01/11/2022   Chronic cholecystitis 01/10/2022   Obstructive sleep apnea 03/09/2019   Aortic regurgitation 12/02/2018   History of pulmonary embolism 08/20/2018   DOE (dyspnea on exertion)    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   Bilateral ovarian cysts    Hx of gallstones    Hernia, hiatal    Renal cysts, acquired, bilateral    GERD (gastroesophageal reflux disease)    Diverticulosis    Lumbar spinal stenosis    Diabetes mellitus without complication ([place])    Left knee DJD    Rapid palpitations 12/06/2013   Morbid obesity ([place]) 12/06/2013   Fatigue 12/06/2013   PVC (premature ventricular contraction) 12/06/2013   Essential hypertension    Hypothyroidism    Hyperlipidemia LDL goal <100      REFERRING PROVIDER: Koirala, Dibas, MD  REFERRING DIAG: L knee TKA/ R knee OA  THERAPY DIAG:  Muscle weakness (generalized)  Difficulty in walking, not elsewhere classified  Cramp and spasm  Abnormal posture  Other low back pain  Rationale for Evaluation and Treatment: Rehabilitation  ONSET DATE: Ongoing for multiple years.    SUBJECTIVE:   SUBJECTIVE STATEMENT: Patient arrives with son.  No new complaints.     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  PAIN:  Are you having pain? Yes: NPRS scale: 8 L knee/10 R knee/10 Pain location: R and L knee.  Pain description: Swelling, stabbing pain Aggravating factors: Standing, turning R knee.  Relieving factors: Rest, [place], heat.  PRECAUTIONS: Fall, Monitor stroke symptoms.   WEIGHT BEARING RESTRICTIONS:  W/C bound due to pain and weakness.  FALLS:  Has patient fallen in last 6 months? Yes. Number of falls 3 in 24 hours. 6+ pt is unable to recall all.   LIVING ENVIRONMENT: Lives with:  Lives with her sister, but son lives a few houses down.  Lives in: House/apartment Stairs: Yes: Internal: 12 steps; on right going up and External: 1 steps; bilateral but cannot reach both Has following equipment at home: Single point cane, Quad cane large base, Walker - 2 wheeled, Walker - 4 wheeled, Shower bench, bed side commode, and Pt has a scooter.   OCCUPATION: Retired  PLOF: Independent  PATIENT GOALS: Pt would like to be able to walk without falling again.   NEXT MD VISIT:   OBJECTIVE:   DIAGNOSTIC FINDINGS: MRI brain (without) demonstrating: - Mild chronic small vessel ischemic disease. - No acute findings.  PATIENT SURVEYS:  FOTO Not taken today due to time restraint.   COGNITION: Overall cognitive status: Within functional limits for tasks assessed   SENSATION: Pt reports feeling her blood moving and her skin crawling upon waking.   EDEMA:  Circumferential: L:20", R:19.5"; R calf R: 16", L:17".    POSTURE:  Abnormal posture noted throughout body.   PALPATION: Pt reports pain in her L calf with palpation.   FOTO: 18.42%, 36% predicted   LOWER EXTREMITY ROM:  Active ROM Right eval Left eval  Hip flexion 50%  25%   Knee flexion 110 100  Knee extension -30 P:-10 -32/ P:-25   Ankle dorsiflexion 50%  50%  Ankle plantarflexion WFL WFL   (Blank rows = not tested)  Pt was seated in a Rolator walker.   LOWER EXTREMITY MMT:  MMT Right eval Left eval  Hip flexion 4- 3  Knee flexion 4- 3+  Knee extension 4- 3+   (Blank rows = not tested)  Pt demonstrates decreased functional ROM of her L UE due to weakness and pain. She has a tendency to move in a synergetic pattern.   FUNCTIONAL TESTS:  5 times sit to stand: 42.57 sec   GAIT: Distance walked: Pt walks with multiple deficits with her gait. L LE foot drag, stooped posture, decreased knee extension, slow shuffled gait, ect.  Assistive device utilized: Environmental consultant - 2 wheeled Level of assistance: CGA and with WC following behind due to reports of knee's giving out and falling.  Comments: Pt reports fatigue.    TODAY'S TREATMENT:  01/08/23: Nustep x 10 min level 3  Seated toe and heel raises x 20 LAQ x 20 with 2 lbs both Seated hip ER x 20 with 2 lbs both Supine quad set x 20 Supine TKE with green noodle x 20 both Supine TKE with white foam roller x 20 with 2# both Supine SAQ with white foam roller x 20 with 2# both SLR without assist x 5 (patient struggled but completed very short range on all ) rested and did 5 more independently, then x 10 assisted by PT  01/03/23: Nustep x 10 min level 3  Seated toe and heel raises x 20 Seated up and over hurdle 0# x 10 each LE (could not do hurdle on left, placed 2 stacked 2lb ankle weight and had patient to up and over these) Seated leg extension 3# 2 x 10 each LE Seated march with 3# x 20 Seated hip ER 2 x 10 with 3# Seated with foot on footstool, quad sets x 20 each LE Sit to stand x 5 with resistance loop behind knees to encourage terminal knee extension (  walker placed in front of patient) Standing TKE 2 x 10 with purple resistance loop Gait training with rw x 30 feet : emphasis on elongating posture and knee extension along with heel strike.    01/01/23: Nustep  x 8 min level 3  Seated leg extension 0# 2 x 10 each LE Gait training with RW 2 x 60 ft (heavy vc's for upright posture, knee extension and heel strike)  Needed several rest breaks to relieve arm pain.   Lengthy discussion with patient regarding when PT gives vc's, we are encouraging more appropriate technique to allow increased ease in her walking and that if patient constantly repeats, "I can't do that or that's just how I am" it will appear that we are not working toward a goal of improving to be able to reach the goal of safe, independent walking.  Encouraged patient to change her mindset to "I will try" vs responding negatively.     PATIENT EDUCATION:  Education details: Educated pt on fall prevention and importance of setting up safe environment at home including using 3 in 1 bedside at night. Reviewed HEP with patient and son. Person educated: Patient and son Education method: Explanation and Demonstration Education comprehension: verbalized understanding, returned demonstration, and needs further education  HOME EXERCISE PROGRAM: Access Code: [email or web address] URL: [email or web address] Date: 12/25/2022 Prepared by: Candyce Churn  Exercises - Seated Long Arc Quad  - 1 x daily - 7 x weekly - 2 sets - 10 reps - Seated March  - 1 x daily - 7 x weekly - 2 sets - 10 reps - Seated Hip Adduction Squeeze with Ball  - 1 x daily - 7 x weekly - 2 sets - 10 reps - 5 hold - Seated Hip Abduction with Resistance  - 1 x daily - 7 x weekly - 3 sets - 10 reps - Sit to Stand with Counter Support  - 1 x daily - 7 x weekly - 2 sets - 5 reps - Seated Knee Extension Stretch with Chair  - 1 x daily - 7 x weekly - 3 sets - 10 reps   ASSESSMENT:  CLINICAL IMPRESSION: Nicolina is working more on exercises vs talking.  She is making great effort at walking with increased knee extension and upright posture.  She has significant weakness in entire left LE and has difficulty bringing left LE through on  swing through often dragging the left toe.  Heavy vc's given each visit to stay as tall as possible to avoid this.  Overall, she is making more effort with her exercises and has been doing well her last 2 visits.    OBJECTIVE IMPAIRMENTS: decreased activity tolerance, difficulty walking, decreased balance, decreased endurance, decreased mobility, decreased ROM, decreased strength, impaired flexibility, impaired UE/LE use, postural dysfunction, and pain.  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  are also affecting patient's functional outcome.  REHAB POTENTIAL: Fair due to complex medical hx.   CLINICAL DECISION MAKING: Unstable/unpredictable  EVALUATION COMPLEXITY: Low   GOALS: Short term PT Goals Target date: 01/22/2023 Pt will be I and compliant with HEP. Baseline:  Goal status: New Pt will decrease pain by 25% overall Baseline: Goal status: New  Long term PT goals Target date: 04/02/2023 Pt will improve ROM to [facility] to improve functional mobility in LE and UE.  Baseline: Goal status: New Pt will improve  L hip/knee strength to at least 4/5 MMT to improve functional strength Baseline: Goal status: New Pt will improve FOTO to at least 36% functional to show improved function Baseline: Goal status: New Pt will reduce pain by overall 50% overall with usual activity Baseline: Goal status: New Pt will improve her STS by 2.3 seconds for Minimal clinically important difference.          Baseline:                Goal status: New  Pt will be able to ambulate community distances at least 1000 ft WNL gait pattern without complaints Baseline: Goal status: New        7. Pt will improve TUG by 3.4 seconds or Minimal clinically important difference.         Baseline:         Goal status: New   PLAN: PT FREQUENCY: 2  times per week   PT DURATION:  12 weeks  PLANNED INTERVENTIONS (unless contraindicated): aquatic PT, Canalith repositioning, cryotherapy, Electrical stimulation, Iontophoresis with 4 mg/ml dexamethasome, Moist heat, traction, Ultrasound, gait training, Therapeutic exercise, balance training, neuromuscular re-education, patient/family education, prosthetic training, manual techniques, passive ROM, dry needling, taping, vasopnuematic device, vestibular, spinal manipulations, joint manipulations  PLAN FOR NEXT SESSION:  Work on strength of UE/LE, balance, pain modulation and edema, fall prevention and home safety.    Anderson Malta B. Shawn Dannenberg, PT 01/08/23 5:01 PM  [facility] [address] [place], [place] [address] Phone # [phone] Fax [phone]

## 2023-01-10 ENCOUNTER — Ambulatory Visit: Payer: Medicare HMO

## 2023-01-10 DIAGNOSIS — R293 Abnormal posture: Secondary | ICD-10-CM

## 2023-01-10 DIAGNOSIS — M6281 Muscle weakness (generalized): Secondary | ICD-10-CM

## 2023-01-10 DIAGNOSIS — R262 Difficulty in walking, not elsewhere classified: Secondary | ICD-10-CM

## 2023-01-10 DIAGNOSIS — R252 Cramp and spasm: Secondary | ICD-10-CM

## 2023-01-10 NOTE — Therapy (Signed)
OUTPATIENT PHYSICAL THERAPY LOWER EXTREMITY TREATMENT NOTE   Patient Name: Tricia Harrison MRN: [id number] DOB:02/24/1946, 77 y.o., female Today's Date: 01/10/2023  END OF SESSION:  PT End of Session - 01/10/23 1025     Visit Number 9    Number of Visits 24    Date for PT Re-Evaluation 03/05/23    Authorization Type [employer] MEDICARE HMO/PPO    Progress Note Due on Visit 10    PT Start Time 1015    PT Stop Time 1100    PT Time Calculation (min) 45 min    Activity Tolerance Patient limited by pain    Behavior During Therapy WFL for tasks assessed/performed              Past Medical History:  Diagnosis Date   Anemia    as a child   Asthma    as a child   Chronic back pain    spinal stenosis and buldging disc;scoliosis   Chronic constipation    occasionally takes something OTC   Chronic kidney disease    Diabetes mellitus without complication ([place])    per pt "Pre" for 20+yrs   Diverticulosis    DVT (deep venous thrombosis) ([facility])    Gallstones    has known about this for 3-61yr   GERD (gastroesophageal reflux disease)    takes Omeprazole daily   H/O hiatal hernia    Heart murmur    Hemorrhoids    History of blood transfusion    no abnormal reaction noted   History of bronchitis    states its been a long time ago   History of gout    HLD (hyperlipidemia)    takes Fenofibrate daily   HTN (hypertension)    takes Diltiazem and Ramipril daily   Hypothyroidism    takes Synthroid daily   Left knee DJD    Nocturia    Palpitations    started in 2013 and only occasionally;last time noticed about 2-3wks ago and after drinking caffeine   PONV (postoperative nausea and vomiting)    Pulmonary embolism with acute cor pulmonale ([facility] 07/2018   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   Past Surgical History:  Procedure Laterality Date   3-day EVENT MONITOR  01/2019   Mostly normal monitor.   Predominantly sinus rhythm (rate range 48-105 bpm, average 67 bpm) 9 short atrial runs (fastest = 13 beats rate 135 bpm, longest ~17 seconds-101 bpm) -> not noted on symptom log.  No sustained arrhythmias (tachycardia or bradycardia), or pauses..  Symptoms are noted with PVCs.   CHOLECYSTECTOMY N/A 01/10/2022   Procedure: LAPAROSCOPIC CHOLECYSTECTOMY WITH INTRAOPERATIVE CHOLANGIOGRAM;  Surgeon: GMichael Boston MD;  Location: [facility];  Service: General;  Laterality: N/A;   COLONOSCOPY     CT ANGIOGRAM OF CHEST - PE PROTOCOL  07/2018   Bilateral nonocclusive pulmonary emboli evidence of right heart strain consistent with "submassive "/intermediate PE.  -->  Code PE called.   DILATION AND CURETTAGE OF UTERUS     multiple about 6-7 times   ESOPHAGOGASTRODUODENOSCOPY     NM VQ LUNG SCAN ([facility] HX)  11/23/2018   Low probability for PE   RIGHT/LEFT HEART CATH AND CORONARY ANGIOGRAPHY N/A 08/19/2018   Procedure: RIGHT/LEFT HEART CATH AND CORONARY ANGIOGRAPHY;  Surgeon: VJettie Booze MD;  [facility];  Service: Cardiovascular:: Nonobstructive CAD.  Mild pulmonary pretension.  Mean PA pressure  26 mmHg with PA P 48/20 mmHg.  Normal LVEDP.   THYROID SURGERY Right 2010   TOTAL HIP ARTHROPLASTY Left 2005   TOTAL KNEE ARTHROPLASTY Left 02/21/2014   Procedure: TOTAL KNEE ARTHROPLASTY;  Surgeon: Lorn Junes, MD;  Location: [facility];  Service: Orthopedics;  Laterality: Left;   TRANSTHORACIC ECHOCARDIOGRAM  07/2018    (In setting of acute PE) mild LVH.  EF 55 to 60%.  GR 1 DD.  Aortic sclerosis but no stenosis.  Mild regurgitation.  Mild RA dilation.  Moderate LA dilation.  Moderate tricuspid regurgitation with severe primary hypertension estimated PA pressure 71 mmHg.  RV poorly visualized   TRANSTHORACIC ECHOCARDIOGRAM  10/2018 - 11/2019   a) EF 60-65%.  GR 1 DD.  Focal basal LVH. Mod AI/ no AS. Mild LA dilation.  Normal RV size and fxn.  Mod PI w/ mild TR.  Peak PAP ~ 38 mmHg; b) Normal EF 60  to 65%.  Moderate LVH-GR 1 DD.  R WMA.  Mild AS w/ trivial AI.  Grossly normal PA, RV and RA size.  Normal RV function.  Normal PA pressures.   TRANSTHORACIC ECHOCARDIOGRAM  01/2021    EF 55%.  Normal LV function.  Mild LVH.  GR 1 DD.  Normal PA pressures.  Severe LA dilation.  (Consistent with more significant diastolic dysfunction).  Mild-possibly mild to moderate aortic insufficiency.  Borderline elevated RAP.  Trivial pericardial effusion.  (Stable)   VAGINAL HYSTERECTOMY  1979   Patient Active Problem List   Diagnosis Date Noted   Uses wheelchair 01/14/2022   Velopharyngeal dysfunction with mild swallowing difficulty 01/14/2022   Uses roller walker 01/11/2022   Atherosclerosis of aorta ([place]) 01/11/2022   Chronic kidney disease, stage 3b ([place]) 01/11/2022   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   Orthostasis 01/11/2022   Leukocytosis 01/11/2022   Chronic cholecystitis 01/10/2022   Obstructive sleep apnea 03/09/2019   Aortic regurgitation 12/02/2018   History of pulmonary embolism 08/20/2018   DOE (dyspnea on exertion)    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   Bilateral ovarian cysts    Hx of gallstones    Hernia, hiatal    Renal cysts, acquired, bilateral    GERD (gastroesophageal reflux disease)    Diverticulosis    Lumbar spinal stenosis    Diabetes mellitus without complication ([place])    Left knee DJD    Rapid palpitations 12/06/2013   Morbid obesity ([place]) 12/06/2013   Fatigue 12/06/2013   PVC (premature ventricular contraction) 12/06/2013   Essential hypertension    Hypothyroidism    Hyperlipidemia LDL goal <100      REFERRING PROVIDER: Koirala, Dibas, MD  REFERRING DIAG: L knee TKA/ R knee OA  THERAPY DIAG:  Muscle weakness (generalized)  Difficulty in walking, not elsewhere classified  Cramp and spasm  Abnormal posture  Rationale for Evaluation and Treatment: Rehabilitation  ONSET DATE: Ongoing for multiple years.   SUBJECTIVE:    SUBJECTIVE STATEMENT: Patient arrives with son.  No new complaints.     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  PAIN:  Are you having pain? Yes: NPRS scale: 8 L knee/10 R knee/10 Pain location: R and L knee.  Pain description: Swelling, stabbing pain Aggravating factors: Standing, turning R knee.  Relieving factors: Rest, [place], heat.  PRECAUTIONS: Fall, Monitor stroke symptoms.   WEIGHT BEARING RESTRICTIONS:  W/C bound due to pain and weakness.   FALLS:  Has  patient fallen in last 6 months? Yes. Number of falls 3 in 24 hours. 6+ pt is unable to recall all.   LIVING ENVIRONMENT: Lives with:  Lives with her sister, but son lives a few houses down.  Lives in: House/apartment Stairs: Yes: Internal: 12 steps; on right going up and External: 1 steps; bilateral but cannot reach both Has following equipment at home: Single point cane, Quad cane large base, Walker - 2 wheeled, Walker - 4 wheeled, Shower bench, bed side commode, and Pt has a scooter.   OCCUPATION: Retired  PLOF: Independent  PATIENT GOALS: Pt would like to be able to walk without falling again.   NEXT MD VISIT:   OBJECTIVE:   DIAGNOSTIC FINDINGS: MRI brain (without) demonstrating: - Mild chronic small vessel ischemic disease. - No acute findings.  PATIENT SURVEYS:  FOTO Not taken today due to time restraint.   COGNITION: Overall cognitive status: Within functional limits for tasks assessed   SENSATION: Pt reports feeling her blood moving and her skin crawling upon waking.   EDEMA:  Circumferential: L:20", R:19.5"; R calf R: 16", L:17".    POSTURE:  Abnormal posture noted throughout body.   PALPATION: Pt reports pain in her L calf with palpation.   FOTO: 18.42%, 36% predicted   LOWER EXTREMITY ROM:  Active ROM Right eval Left eval  Hip flexion 50%  25%   Knee flexion 110 100  Knee extension -30 P:-10 -32/ P:-25  Ankle dorsiflexion  50%  50%  Ankle plantarflexion WFL WFL   (Blank rows = not tested)  Pt was seated in a Rolator walker.   LOWER EXTREMITY MMT:  MMT Right eval Left eval  Hip flexion 4- 3  Knee flexion 4- 3+  Knee extension 4- 3+   (Blank rows = not tested)  Pt demonstrates decreased functional ROM of her L UE due to weakness and pain. She has a tendency to move in a synergetic pattern.   FUNCTIONAL TESTS:  5 times sit to stand: 42.57 sec   GAIT: Distance walked: Pt walks with multiple deficits with her gait. L LE foot drag, stooped posture, decreased knee extension, slow shuffled gait, ect.  Assistive device utilized: Environmental consultant - 2 wheeled Level of assistance: CGA and with WC following behind due to reports of knee's giving out and falling.  Comments: Pt reports fatigue.    TODAY'S TREATMENT:  01/10/23: Nustep x 10 min level 3  Gait training with 2 wheel walker 2 x 80 ft : verbal cues for upright posture and knee extension Sit to stand 2 x 5 In // bars: alternating tap ups on footstool x 10 (patient unable to lift left LE high enough for foot stool, switched to 4" step In //bars: alternating tap ups on 4" step x 20 In // bars: Step up on right x 10 then left x 10 In // bars: hip abduction and extension 2 x 10 each on each LE  01/08/23: Nustep x 10 min level 3  Seated toe and heel raises x 20 LAQ x 20 with 2 lbs both Seated hip ER x 20 with 2 lbs both Supine quad set x 20 Supine TKE with green noodle x 20 both Supine TKE with white foam roller x 20 with 2# both Supine SAQ with white foam roller x 20 with 2# both SLR without assist x 5 (patient struggled but completed very short range on all ) rested and did 5 more independently, then x 10 assisted by PT  01/03/23:  Nustep x 10 min level 3  Seated toe and heel raises x 20 Seated up and over hurdle 0# x 10 each LE (could not do hurdle on left, placed 2 stacked 2lb ankle weight and had patient to up and over these) Seated leg extension 3# 2 x  10 each LE Seated march with 3# x 20 Seated hip ER 2 x 10 with 3# Seated with foot on footstool, quad sets x 20 each LE Sit to stand x 5 with resistance loop behind knees to encourage terminal knee extension (walker placed in front of patient) Standing TKE 2 x 10 with purple resistance loop Gait training with rw x 30 feet : emphasis on elongating posture and knee extension along with heel strike.      PATIENT EDUCATION:  Education details: Educated pt on fall prevention and importance of setting up safe environment at home including using 3 in 1 bedside at night. Reviewed HEP with patient and son. Person educated: Patient and son Education method: Explanation and Demonstration Education comprehension: verbalized understanding, returned demonstration, and needs further education  HOME EXERCISE PROGRAM: Access Code: [email or web address] URL: [email or web address] Date: 12/25/2022 Prepared by: Candyce Churn  Exercises - Seated Long Arc Quad  - 1 x daily - 7 x weekly - 2 sets - 10 reps - Seated March  - 1 x daily - 7 x weekly - 2 sets - 10 reps - Seated Hip Adduction Squeeze with Ball  - 1 x daily - 7 x weekly - 2 sets - 10 reps - 5 hold - Seated Hip Abduction with Resistance  - 1 x daily - 7 x weekly - 3 sets - 10 reps - Sit to Stand with Counter Support  - 1 x daily - 7 x weekly - 2 sets - 5 reps - Seated Knee Extension Stretch with Chair  - 1 x daily - 7 x weekly - 3 sets - 10 reps   ASSESSMENT:  CLINICAL IMPRESSION: Chesleigh is beginning to make excellent progress with ambulation.  She was able to ambulate 80 feet without standing rest break.  Posture is much improved when walking allowing improved foot clearance.  Her step length and gait speed are also improving.  She appears to be more motivated and less fearful.  She should continue to improve.  She would benefit from continued skilled PT for LE and UE strengthening, gait training, postural strengthening and mobility  training.    OBJECTIVE IMPAIRMENTS: decreased activity tolerance, difficulty walking, decreased balance, decreased endurance, decreased mobility, decreased ROM, decreased strength, impaired flexibility, impaired UE/LE use, postural dysfunction, and pain.  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  are also affecting patient's functional outcome.  REHAB POTENTIAL: Fair due to complex medical hx.   CLINICAL DECISION MAKING: Unstable/unpredictable  EVALUATION COMPLEXITY: Low   GOALS: Short term PT Goals Target date: 01/24/2023 Pt will be I and compliant with HEP. Baseline:  Goal status: New Pt will decrease pain by 25% overall Baseline: Goal status: New  Long term PT goals Target date: 04/04/2023 Pt will improve ROM to [facility] to improve functional mobility in LE and UE.  Baseline: Goal status: New Pt will improve L hip/knee strength to at least 4/5 MMT to improve functional strength Baseline: Goal status: New Pt will improve FOTO to at least 36% functional to show improved function Baseline: Goal status: New Pt  will reduce pain by overall 50% overall with usual activity Baseline: Goal status: New Pt will improve her STS by 2.3 seconds for Minimal clinically important difference.          Baseline:                Goal status: New  Pt will be able to ambulate community distances at least 1000 ft WNL gait pattern without complaints Baseline: Goal status: New        7. Pt will improve TUG by 3.4 seconds or Minimal clinically important difference.         Baseline:         Goal status: New   PLAN: PT FREQUENCY: 2 times per week   PT DURATION:  12 weeks  PLANNED INTERVENTIONS (unless contraindicated): aquatic PT, Canalith repositioning, cryotherapy, Electrical stimulation, Iontophoresis with 4 mg/ml  dexamethasome, Moist heat, traction, Ultrasound, gait training, Therapeutic exercise, balance training, neuromuscular re-education, patient/family education, prosthetic training, manual techniques, passive ROM, dry needling, taping, vasopnuematic device, vestibular, spinal manipulations, joint manipulations  PLAN FOR NEXT SESSION:  Work on strength of UE/LE, balance, pain modulation and edema, fall prevention and home safety.    Anderson Malta B. Kainoah Bartosiewicz, PT 01/10/23 5:27 PM  [facility] [address] [place] [address] [place], [place] [address] Phone # [phone] Fax [phone]

## 2023-01-15 ENCOUNTER — Ambulatory Visit: Payer: Medicare HMO

## 2023-01-16 NOTE — Therapy (Signed)
OUTPATIENT PHYSICAL THERAPY LOWER EXTREMITY TREATMENT NOTE   Patient Name: Tricia Harrison MRN: [id number] DOB:[birth year], 77 y.o., female Today's Date: 01/17/2023  END OF SESSION:  PT End of Session - 01/17/23 1108     Visit Number 10    Number of Visits 24    Date for PT Re-Evaluation 03/05/23    Authorization Type [employer] MEDICARE HMO/PPO    Progress Note Due on Visit 10    PT Start Time 1107    PT Stop Time 1145    PT Time Calculation (min) 38 min    Equipment Utilized During Treatment Gait belt    Activity Tolerance Patient tolerated treatment well    Behavior During Therapy WFL for tasks assessed/performed               Past Medical History:  Diagnosis Date   Anemia    as a child   Asthma    as a child   Chronic back pain    spinal stenosis and buldging disc;scoliosis   Chronic constipation    occasionally takes something OTC   Chronic kidney disease    Diabetes mellitus without complication ([place])    per pt "Pre" for 20+yrs   Diverticulosis    DVT (deep venous thrombosis) ([facility])    Gallstones    has known about this for 3-34yr   GERD (gastroesophageal reflux disease)    takes Omeprazole daily   H/O hiatal hernia    Heart murmur    Hemorrhoids    History of blood transfusion    no abnormal reaction noted   History of bronchitis    states its been a long time ago   History of gout    HLD (hyperlipidemia)    takes Fenofibrate daily   HTN (hypertension)    takes Diltiazem and Ramipril daily   Hypothyroidism    takes Synthroid daily   Left knee DJD    Nocturia    Palpitations    started in 2013 and only occasionally;last time noticed about 2-3wks ago and after drinking caffeine   PONV (postoperative nausea and vomiting)    Pulmonary embolism with acute cor pulmonale ([facility] 07/2018   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   Past Surgical History:  Procedure Laterality Date    3-day EVENT MONITOR  01/2019   Mostly normal monitor.  Predominantly sinus rhythm (rate range 48-105 bpm, average 67 bpm) 9 short atrial runs (fastest = 13 beats rate 135 bpm, longest ~17 seconds-101 bpm) -> not noted on symptom log.  No sustained arrhythmias (tachycardia or bradycardia), or pauses..  Symptoms are noted with PVCs.   CHOLECYSTECTOMY N/A 01/10/2022   Procedure: LAPAROSCOPIC CHOLECYSTECTOMY WITH INTRAOPERATIVE CHOLANGIOGRAM;  Surgeon: GMichael Boston MD;  Location: [facility];  Service: General;  Laterality: N/A;   COLONOSCOPY     CT ANGIOGRAM OF CHEST - PE PROTOCOL  07/2018   Bilateral nonocclusive pulmonary emboli evidence of right heart strain consistent with "submassive "/intermediate PE.  -->  Code PE called.   DILATION AND CURETTAGE OF UTERUS     multiple about 6-7 times   ESOPHAGOGASTRODUODENOSCOPY     NM VQ LUNG SCAN ([facility] HX)  11/23/2018   Low probability for PE   RIGHT/LEFT HEART CATH AND CORONARY ANGIOGRAPHY N/A 08/19/2018   Procedure: RIGHT/LEFT HEART CATH AND CORONARY ANGIOGRAPHY;  Surgeon: VJettie Booze MD;  [facility];  Service: Cardiovascular::  Nonobstructive CAD.  Mild pulmonary pretension.  Mean PA pressure 26 mmHg with PA P 48/20 mmHg.  Normal LVEDP.   THYROID SURGERY Right 2010   TOTAL HIP ARTHROPLASTY Left 2005   TOTAL KNEE ARTHROPLASTY Left 02/21/2014   Procedure: TOTAL KNEE ARTHROPLASTY;  Surgeon: Lorn Junes, MD;  Location: [facility];  Service: Orthopedics;  Laterality: Left;   TRANSTHORACIC ECHOCARDIOGRAM  07/2018    (In setting of acute PE) mild LVH.  EF 55 to 60%.  GR 1 DD.  Aortic sclerosis but no stenosis.  Mild regurgitation.  Mild RA dilation.  Moderate LA dilation.  Moderate tricuspid regurgitation with severe primary hypertension estimated PA pressure 71 mmHg.  RV poorly visualized   TRANSTHORACIC ECHOCARDIOGRAM  10/2018 - 11/2019   a) EF 60-65%.  GR 1 DD.  Focal basal LVH. Mod AI/ no AS. Mild LA dilation.  Normal RV size and fxn.   Mod PI w/ mild TR.  Peak PAP ~ 38 mmHg; b) Normal EF 60 to 65%.  Moderate LVH-GR 1 DD.  R WMA.  Mild AS w/ trivial AI.  Grossly normal PA, RV and RA size.  Normal RV function.  Normal PA pressures.   TRANSTHORACIC ECHOCARDIOGRAM  01/2021    EF 55%.  Normal LV function.  Mild LVH.  GR 1 DD.  Normal PA pressures.  Severe LA dilation.  (Consistent with more significant diastolic dysfunction).  Mild-possibly mild to moderate aortic insufficiency.  Borderline elevated RAP.  Trivial pericardial effusion.  (Stable)   VAGINAL HYSTERECTOMY  1979   Patient Active Problem List   Diagnosis Date Noted   Uses wheelchair 01/14/2022   Velopharyngeal dysfunction with mild swallowing difficulty 01/14/2022   Uses roller walker 01/11/2022   Atherosclerosis of aorta ([place]) 01/11/2022   Chronic kidney disease, stage 3b ([place]) 01/11/2022   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   Orthostasis 01/11/2022   Leukocytosis 01/11/2022   Chronic cholecystitis 01/10/2022   Obstructive sleep apnea 03/09/2019   Aortic regurgitation 12/02/2018   History of pulmonary embolism 08/20/2018   DOE (dyspnea on exertion)    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   Bilateral ovarian cysts    Hx of gallstones    Hernia, hiatal    Renal cysts, acquired, bilateral    GERD (gastroesophageal reflux disease)    Diverticulosis    Lumbar spinal stenosis    Diabetes mellitus without complication ([place])    Left knee DJD    Rapid palpitations 12/06/2013   Morbid obesity ([place]) 12/06/2013   Fatigue 12/06/2013   PVC (premature ventricular contraction) 12/06/2013   Essential hypertension    Hypothyroidism    Hyperlipidemia LDL goal <100      REFERRING PROVIDER: Koirala, Dibas, MD  REFERRING DIAG: L knee TKA/ R knee OA  THERAPY DIAG:  Muscle weakness (generalized)  Difficulty in walking, not elsewhere classified  Cramp and spasm  Abnormal posture  Rationale for Evaluation and Treatment: Rehabilitation  ONSET  DATE: Ongoing for multiple years.   SUBJECTIVE:   SUBJECTIVE STATEMENT: Pt states that she has made 60% improvements since starting PT. She is noting improvements with strength and ambulation and a reduction in pain. She reports pain being between 3-4/ 10 recently.   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  PAIN:  Are you having pain? Yes: NPRS scale: 8 L knee/10 R knee/10 Pain location: R and L knee.  Pain description: Swelling, stabbing pain Aggravating factors: Standing, turning  R knee.  Relieving factors: Rest, [place], heat.  PRECAUTIONS: Fall, Monitor stroke symptoms.   WEIGHT BEARING RESTRICTIONS:  W/C bound due to pain and weakness.   FALLS:  Has patient fallen in last 6 months? Yes. Number of falls 3 in 24 hours. 6+ pt is unable to recall all.   LIVING ENVIRONMENT: Lives with:  Lives with her sister, but son lives a few houses down.  Lives in: House/apartment Stairs: Yes: Internal: 12 steps; on right going up and External: 1 steps; bilateral but cannot reach both Has following equipment at home: Single point cane, Quad cane large base, Walker - 2 wheeled, Walker - 4 wheeled, Shower bench, bed side commode, and Pt has a scooter.   OCCUPATION: Retired  PLOF: Independent  PATIENT GOALS: Pt would like to be able to walk without falling again.   NEXT MD VISIT:   OBJECTIVE:   DIAGNOSTIC FINDINGS: MRI brain (without) demonstrating: - Mild chronic small vessel ischemic disease. - No acute findings.  PATIENT SURVEYS:  FOTO Not taken today due to time restraint.   COGNITION: Overall cognitive status: Within functional limits for tasks assessed   SENSATION: Pt reports feeling her blood moving and her skin crawling upon waking.   EDEMA:  Circumferential: L:20", R:19.5"; R calf R: 16", L:17".    POSTURE:  Abnormal posture noted throughout body.   PALPATION: Pt reports pain in her L calf with palpation.    FOTO: 18.42%, 36% predicted   LOWER EXTREMITY ROM:  Active ROM Right eval Left eval 01/17/2023 R/L  Hip flexion 50%  25%  70%/50%  Knee flexion 110 100 105/118  Knee extension -30 P:-10 -32/ P:-25 -28/-29  Ankle dorsiflexion 50%  50% 75%/75%   Ankle plantarflexion [facility] WFL WFL   (Blank rows = not tested)  Pt was seated in a Rolator walker.   LOWER EXTREMITY MMT:  MMT Right eval Left eval 01/17/2023 R/L  Hip flexion 4- 3 4/4-  Knee flexion 4- 3+ 4+/4+  Knee extension 4- 3+ 4+/4   (Blank rows = not tested)  Pt demonstrates decreased functional ROM of her L UE due to weakness and pain. She has a tendency to move in a synergetic pattern.   FUNCTIONAL TESTS:  5 times sit to stand: 42.57 sec   GAIT: Distance walked: Pt walks with multiple deficits with her gait. L LE foot drag, stooped posture, decreased knee extension, slow shuffled gait, ect.  Assistive device utilized: Environmental consultant - 2 wheeled Level of assistance: CGA and with WC following behind due to reports of knee's giving out and falling.  Comments: Pt reports fatigue.    TODAY'S TREATMENT:  01/17/23: Nustep x 10 min level 4  Re-assessment of TUG, STS, strength and ROM Reviewed goals while on NuStep.  Seated marching LAQ x10  STS x5  Transfers to and from [facility] and low mat 01/10/23: Nustep x 10 min level 3  Gait training with 2 wheel walker 2 x 80 ft : verbal cues for upright posture and knee extension Sit to stand 2 x 5 In // bars: alternating tap ups on footstool x 10 (patient unable to lift left LE high enough for foot stool, switched to 4" step In //bars: alternating tap ups on 4" step x 20 In // bars: Step up on right x 10 then left x 10 In // bars: hip abduction and extension 2 x 10 each on each LE  01/08/23: Nustep x 10 min level 3  Seated toe and heel raises  x 20 LAQ x 20 with 2 lbs both Seated hip ER x 20 with 2 lbs both Supine quad set x 20 Supine TKE with green noodle x 20 both Supine TKE with  white foam roller x 20 with 2# both Supine SAQ with white foam roller x 20 with 2# both SLR without assist x 5 (patient struggled but completed very short range on all ) rested and did 5 more independently, then x 10 assisted by PT  01/03/23: Nustep x 10 min level 3  Seated toe and heel raises x 20 Seated up and over hurdle 0# x 10 each LE (could not do hurdle on left, placed 2 stacked 2lb ankle weight and had patient to up and over these) Seated leg extension 3# 2 x 10 each LE Seated march with 3# x 20 Seated hip ER 2 x 10 with 3# Seated with foot on footstool, quad sets x 20 each LE Sit to stand x 5 with resistance loop behind knees to encourage terminal knee extension (walker placed in front of patient) Standing TKE 2 x 10 with purple resistance loop Gait training with rw x 30 feet : emphasis on elongating posture and knee extension along with heel strike.      PATIENT EDUCATION:  Education details: Educated pt on fall prevention and importance of setting up safe environment at home including using 3 in 1 bedside at night. Reviewed HEP with patient and son. Person educated: Patient and son Education method: Explanation and Demonstration Education comprehension: verbalized understanding, returned demonstration, and needs further education  HOME EXERCISE PROGRAM: Access Code: [email or web address] URL: [email or web address] Date: 12/25/2022 Prepared by: Candyce Churn  Exercises - Seated Long Arc Quad  - 1 x daily - 7 x weekly - 2 sets - 10 reps - Seated March  - 1 x daily - 7 x weekly - 2 sets - 10 reps - Seated Hip Adduction Squeeze with Ball  - 1 x daily - 7 x weekly - 2 sets - 10 reps - 5 hold - Seated Hip Abduction with Resistance  - 1 x daily - 7 x weekly - 3 sets - 10 reps - Sit to Stand with Counter Support  - 1 x daily - 7 x weekly - 2 sets - 5 reps - Seated Knee Extension Stretch with Chair  - 1 x daily - 7 x weekly - 3 sets - 10 reps   ASSESSMENT:  CLINICAL  IMPRESSION: Tricia Harrison has progressed very well with therapy.  Improved impairments include: Bilat LE strength, balance, ambulation with a FWW, ROM and functional movements.  Functional improvements include: Sit to stands, transfers <> WC. Progressions needed include: Continued strength and ROM in bilat LE's, balance, stairs and stance time to be able to complete functional activities and cleaning. Barriers to progress include: Other medical issues, age, past experiences. As noted by goals and objective measures, pt has made a lot of improvements in her walking speed, sit to stands, strength and ROM. She is now able to independently stand up and walk a few feet without assistance. She also demonstrates improved cardiovascular endurance. She has room for improvements with PT. Pt has secondary vertigo that is limiting transfers from supine to sitting and bending forward. Please see GOALS section for progress on short term and long term goals established at evaluation.  I recommend continued PT to continue with current progress and continued functional deficits.   OBJECTIVE IMPAIRMENTS: decreased activity tolerance, difficulty walking, decreased balance, decreased endurance, decreased  mobility, decreased ROM, decreased strength, impaired flexibility, impaired UE/LE use, postural dysfunction, and pain.  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  are also affecting patient's functional outcome.  REHAB POTENTIAL: Fair due to complex medical hx.   CLINICAL DECISION MAKING: Unstable/unpredictable  EVALUATION COMPLEXITY: Low   GOALS: Short term PT Goals Target date: 01/31/2023 Pt will be I and compliant with HEP. Baseline:  Goal status: New Pt will decrease pain by 25% overall Baseline: Goal status: New  Long term PT goals  Target date: 04/11/2023 Pt will improve ROM to [facility] to improve functional mobility in LE and UE.  Baseline: Goal status: New Pt will improve L hip/knee strength to at least 4/5 MMT to improve functional strength Baseline: Goal status: New Pt will improve FOTO to at least 36% functional to show improved function Baseline: Goal status: NOT MET 01/17/2023, 18.4246% Pt will reduce pain by overall 50% overall with usual activity Baseline: Goal status: MET 01/17/2023 Pt will improve her STS by 2.3 seconds for Minimal clinically important difference.          Baseline:  42.57 sec               Goal status: MET 01/17/2023, 29.60 seconds Pt will be able to ambulate community distances at least 1000 ft WNL gait pattern without complaints Baseline: Goal status: New        7. Pt will improve TUG by 3.4 seconds or Minimal clinically important difference.         Baseline:         Goal status: 27.06 seconds  8. Pt will be able to walk room to room with a SPC, without complaints         Baseline:        Goal status: NEW 9. Pt would like to improve her balance to be able to lift one leg off the floor for >5 seconds.                    Baseline:                     Goal status: NEW  10. Pt will report a reduction in vertigo symptoms                   Baseline:        Goal status: NEW  PLAN: PT FREQUENCY: 2 times per week   PT DURATION:  8 weeks  PLANNED INTERVENTIONS (unless contraindicated): aquatic PT, Canalith repositioning, cryotherapy, Electrical stimulation, Iontophoresis with 4 mg/ml dexamethasome, Moist heat, traction, Ultrasound, gait training, Therapeutic exercise, balance training, neuromuscular re-education, patient/family education, prosthetic training, manual techniques, passive ROM, dry needling, taping, vasopnuematic device, vestibular, spinal manipulations, joint manipulations  PLAN FOR NEXT SESSION:  Work on strength of UE/LE, balance, pain modulation and edema, fall prevention  and home safety.    Rudi Heap PT, DPT 01/17/23  11:55 AM

## 2023-01-17 ENCOUNTER — Ambulatory Visit: Payer: Medicare HMO | Admitting: Physical Therapy

## 2023-01-17 ENCOUNTER — Encounter: Payer: Self-pay | Admitting: Physical Therapy

## 2023-01-17 DIAGNOSIS — R252 Cramp and spasm: Secondary | ICD-10-CM

## 2023-01-17 DIAGNOSIS — R262 Difficulty in walking, not elsewhere classified: Secondary | ICD-10-CM

## 2023-01-17 DIAGNOSIS — M6281 Muscle weakness (generalized): Secondary | ICD-10-CM

## 2023-01-17 DIAGNOSIS — R293 Abnormal posture: Secondary | ICD-10-CM

## 2023-01-22 ENCOUNTER — Ambulatory Visit: Payer: Medicare HMO

## 2023-01-22 DIAGNOSIS — M5459 Other low back pain: Secondary | ICD-10-CM

## 2023-01-22 DIAGNOSIS — R262 Difficulty in walking, not elsewhere classified: Secondary | ICD-10-CM

## 2023-01-22 DIAGNOSIS — R252 Cramp and spasm: Secondary | ICD-10-CM

## 2023-01-22 DIAGNOSIS — M6281 Muscle weakness (generalized): Secondary | ICD-10-CM | POA: Diagnosis not present

## 2023-01-22 DIAGNOSIS — R293 Abnormal posture: Secondary | ICD-10-CM

## 2023-01-22 NOTE — Therapy (Signed)
OUTPATIENT PHYSICAL THERAPY LOWER EXTREMITY TREATMENT NOTE   Patient Name: Tricia Harrison MRN: [id number] DOB:July 01, 1946, 77 y.o., female Today's Date: 01/22/2023  END OF SESSION:  PT End of Session - 01/22/23 1113     Visit Number 11    Number of Visits 26    Date for PT Re-Evaluation 03/07/23    Authorization Type [employer] MEDICARE HMO/PPO    Progress Note Due on Visit 17    PT Start Time 1105    PT Stop Time 1145    PT Time Calculation (min) 40 min    Activity Tolerance Patient tolerated treatment well    Behavior During Therapy WFL for tasks assessed/performed               Past Medical History:  Diagnosis Date   Anemia    as a child   Asthma    as a child   Chronic back pain    spinal stenosis and buldging disc;scoliosis   Chronic constipation    occasionally takes something OTC   Chronic kidney disease    Diabetes mellitus without complication ([place])    per pt "Pre" for 20+yrs   Diverticulosis    DVT (deep venous thrombosis) ([facility])    Gallstones    has known about this for 3-36yr   GERD (gastroesophageal reflux disease)    takes Omeprazole daily   H/O hiatal hernia    Heart murmur    Hemorrhoids    History of blood transfusion    no abnormal reaction noted   History of bronchitis    states its been a long time ago   History of gout    HLD (hyperlipidemia)    takes Fenofibrate daily   HTN (hypertension)    takes Diltiazem and Ramipril daily   Hypothyroidism    takes Synthroid daily   Left knee DJD    Nocturia    Palpitations    started in 2013 and only occasionally;last time noticed about 2-3wks ago and after drinking caffeine   PONV (postoperative nausea and vomiting)    Pulmonary embolism with acute cor pulmonale ([facility] 07/2018   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   Past Surgical History:  Procedure Laterality Date   3-day EVENT MONITOR  01/2019   Mostly normal monitor.   Predominantly sinus rhythm (rate range 48-105 bpm, average 67 bpm) 9 short atrial runs (fastest = 13 beats rate 135 bpm, longest ~17 seconds-101 bpm) -> not noted on symptom log.  No sustained arrhythmias (tachycardia or bradycardia), or pauses..  Symptoms are noted with PVCs.   CHOLECYSTECTOMY N/A 01/10/2022   Procedure: LAPAROSCOPIC CHOLECYSTECTOMY WITH INTRAOPERATIVE CHOLANGIOGRAM;  Surgeon: GMichael Boston MD;  Location: [facility];  Service: General;  Laterality: N/A;   COLONOSCOPY     CT ANGIOGRAM OF CHEST - PE PROTOCOL  07/2018   Bilateral nonocclusive pulmonary emboli evidence of right heart strain consistent with "submassive "/intermediate PE.  -->  Code PE called.   DILATION AND CURETTAGE OF UTERUS     multiple about 6-7 times   ESOPHAGOGASTRODUODENOSCOPY     NM VQ LUNG SCAN ([facility] HX)  11/23/2018   Low probability for PE   RIGHT/LEFT HEART CATH AND CORONARY ANGIOGRAPHY N/A 08/19/2018   Procedure: RIGHT/LEFT HEART CATH AND CORONARY ANGIOGRAPHY;  Surgeon: VJettie Booze MD;  [facility];  Service: Cardiovascular:: Nonobstructive CAD.  Mild pulmonary pretension.  Mean PA  pressure 26 mmHg with PA P 48/20 mmHg.  Normal LVEDP.   THYROID SURGERY Right 2010   TOTAL HIP ARTHROPLASTY Left 2005   TOTAL KNEE ARTHROPLASTY Left 02/21/2014   Procedure: TOTAL KNEE ARTHROPLASTY;  Surgeon: Lorn Junes, MD;  Location: [facility];  Service: Orthopedics;  Laterality: Left;   TRANSTHORACIC ECHOCARDIOGRAM  07/2018    (In setting of acute PE) mild LVH.  EF 55 to 60%.  GR 1 DD.  Aortic sclerosis but no stenosis.  Mild regurgitation.  Mild RA dilation.  Moderate LA dilation.  Moderate tricuspid regurgitation with severe primary hypertension estimated PA pressure 71 mmHg.  RV poorly visualized   TRANSTHORACIC ECHOCARDIOGRAM  10/2018 - 11/2019   a) EF 60-65%.  GR 1 DD.  Focal basal LVH. Mod AI/ no AS. Mild LA dilation.  Normal RV size and fxn.  Mod PI w/ mild TR.  Peak PAP ~ 38 mmHg; b) Normal EF 60  to 65%.  Moderate LVH-GR 1 DD.  R WMA.  Mild AS w/ trivial AI.  Grossly normal PA, RV and RA size.  Normal RV function.  Normal PA pressures.   TRANSTHORACIC ECHOCARDIOGRAM  01/2021    EF 55%.  Normal LV function.  Mild LVH.  GR 1 DD.  Normal PA pressures.  Severe LA dilation.  (Consistent with more significant diastolic dysfunction).  Mild-possibly mild to moderate aortic insufficiency.  Borderline elevated RAP.  Trivial pericardial effusion.  (Stable)   VAGINAL HYSTERECTOMY  1979   Patient Active Problem List   Diagnosis Date Noted   Uses wheelchair 01/14/2022   Velopharyngeal dysfunction with mild swallowing difficulty 01/14/2022   Uses roller walker 01/11/2022   Atherosclerosis of aorta ([place]) 01/11/2022   Chronic kidney disease, stage 3b ([place]) 01/11/2022   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   Orthostasis 01/11/2022   Leukocytosis 01/11/2022   Chronic cholecystitis 01/10/2022   Obstructive sleep apnea 03/09/2019   Aortic regurgitation 12/02/2018   History of pulmonary embolism 08/20/2018   DOE (dyspnea on exertion)    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   Bilateral ovarian cysts    Hx of gallstones    Hernia, hiatal    Renal cysts, acquired, bilateral    GERD (gastroesophageal reflux disease)    Diverticulosis    Lumbar spinal stenosis    Diabetes mellitus without complication ([place])    Left knee DJD    Rapid palpitations 12/06/2013   Morbid obesity ([place]) 12/06/2013   Fatigue 12/06/2013   PVC (premature ventricular contraction) 12/06/2013   Essential hypertension    Hypothyroidism    Hyperlipidemia LDL goal <100      REFERRING PROVIDER: Koirala, Dibas, MD  REFERRING DIAG: L knee TKA/ R knee OA  THERAPY DIAG:  Muscle weakness (generalized)  Difficulty in walking, not elsewhere classified  Cramp and spasm  Abnormal posture  Other low back pain  Rationale for Evaluation and Treatment: Rehabilitation  ONSET DATE: Ongoing for multiple years.    SUBJECTIVE:   SUBJECTIVE STATEMENT: Pt states that she was going into a bathroom stall at a restaurant and as she was coming out the door hit her in the right shoulder which has caused her shoulder to hurt. Otherwise,  she is doing well.    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  PAIN:  Are you having pain? Yes: NPRS scale: 8 L knee/10 R knee/10 Pain location: R and L knee.  Pain description: Swelling, stabbing  pain Aggravating factors: Standing, turning R knee.  Relieving factors: Rest, [place], heat.  PRECAUTIONS: Fall, Monitor stroke symptoms.   WEIGHT BEARING RESTRICTIONS:  W/C bound due to pain and weakness.   FALLS:  Has patient fallen in last 6 months? Yes. Number of falls 3 in 24 hours. 6+ pt is unable to recall all.   LIVING ENVIRONMENT: Lives with:  Lives with her sister, but son lives a few houses down.  Lives in: House/apartment Stairs: Yes: Internal: 12 steps; on right going up and External: 1 steps; bilateral but cannot reach both Has following equipment at home: Single point cane, Quad cane large base, Walker - 2 wheeled, Walker - 4 wheeled, Shower bench, bed side commode, and Pt has a scooter.   OCCUPATION: Retired  PLOF: Independent  PATIENT GOALS: Pt would like to be able to walk without falling again.   NEXT MD VISIT:   OBJECTIVE:   DIAGNOSTIC FINDINGS: MRI brain (without) demonstrating: - Mild chronic small vessel ischemic disease. - No acute findings.  PATIENT SURVEYS:  FOTO Not taken today due to time restraint.   COGNITION: Overall cognitive status: Within functional limits for tasks assessed   SENSATION: Pt reports feeling her blood moving and her skin crawling upon waking.   EDEMA:  Circumferential: L:20", R:19.5"; R calf R: 16", L:17".    POSTURE:  Abnormal posture noted throughout body.   PALPATION: Pt reports pain in her L calf with palpation.   FOTO: 18.42%, 36%  predicted   LOWER EXTREMITY ROM:  Active ROM Right eval Left eval 01/17/2023 R/L  Hip flexion 50%  25%  70%/50%  Knee flexion 110 100 105/118  Knee extension -30 P:-10 -32/ P:-25 -28/-29  Ankle dorsiflexion 50%  50% 75%/75%   Ankle plantarflexion [facility] WFL WFL   (Blank rows = not tested)  Pt was seated in a Rolator walker.   LOWER EXTREMITY MMT:  MMT Right eval Left eval 01/17/2023 R/L  Hip flexion 4- 3 4/4-  Knee flexion 4- 3+ 4+/4+  Knee extension 4- 3+ 4+/4   (Blank rows = not tested)  Pt demonstrates decreased functional ROM of her L UE due to weakness and pain. She has a tendency to move in a synergetic pattern.   FUNCTIONAL TESTS:  5 times sit to stand: 42.57 sec   GAIT: Distance walked: Pt walks with multiple deficits with her gait. L LE foot drag, stooped posture, decreased knee extension, slow shuffled gait, ect.  Assistive device utilized: Environmental consultant - 2 wheeled Level of assistance: CGA and with WC following behind due to reports of knee's giving out and falling.  Comments: Pt reports fatigue.    TODAY'S TREATMENT:  01/22/23: Nustep x 10 min level 4 (PT present to discuss status and progress toward goals) Gait training with rw 85 feet x 2 (second distance was 100 feet) Seated marching x 20 with 3 lbs LAQ 2 x10 with 3 lbs Standing TKE x 10 each standing holding on to walker wit green tband  01/17/23: Nustep x 10 min level 4  Re-assessment of TUG, STS, strength and ROM Reviewed goals while on NuStep.  Seated marching LAQ x10  STS x5  Transfers to and from [facility] and low mat 01/10/23: Nustep x 10 min level 3  Gait training with 2 wheel walker 2 x 80 ft : verbal cues for upright posture and knee extension Sit to stand 2 x 5 In // bars: alternating tap ups on footstool x 10 (patient unable to lift left  LE high enough for foot stool, switched to 4" step In //bars: alternating tap ups on 4" step x 20 In // bars: Step up on right x 10 then left x 10 In // bars: hip  abduction and extension 2 x 10 each on each LE    PATIENT EDUCATION:  Education details: Educated pt on fall prevention and importance of setting up safe environment at home including using 3 in 1 bedside at night. Reviewed HEP with patient and son. Person educated: Patient and son Education method: Explanation and Demonstration Education comprehension: verbalized understanding, returned demonstration, and needs further education  HOME EXERCISE PROGRAM: Access Code: [email or web address] URL: [email or web address] Date: 12/25/2022 Prepared by: Candyce Churn  Exercises - Seated Long Arc Quad  - 1 x daily - 7 x weekly - 2 sets - 10 reps - Seated March  - 1 x daily - 7 x weekly - 2 sets - 10 reps - Seated Hip Adduction Squeeze with Ball  - 1 x daily - 7 x weekly - 2 sets - 10 reps - 5 hold - Seated Hip Abduction with Resistance  - 1 x daily - 7 x weekly - 3 sets - 10 reps - Sit to Stand with Counter Support  - 1 x daily - 7 x weekly - 2 sets - 5 reps - Seated Knee Extension Stretch with Chair  - 1 x daily - 7 x weekly - 3 sets - 10 reps   ASSESSMENT:  CLINICAL IMPRESSION: Mrs. Saffold is progressing with distance walked but continues to have poor left LE foot clearance on swing through.  She demonstrates more consistency with keeping her head up during ambulation and gait speed is much improved.  She has been more comfortable leaving home and transferring in/out of car.  She would benefit from continued skilled PT for LE strengthening and functional training.    OBJECTIVE IMPAIRMENTS: decreased activity tolerance, difficulty walking, decreased balance, decreased endurance, decreased mobility, decreased ROM, decreased strength, impaired flexibility, impaired UE/LE use, postural dysfunction, and pain.  ACTIVITY LIMITATIONS: bending, lifting, carry, locomotion, cleaning, community activity, driving, and or occupation  PERSONAL FACTORS: DM, HTN, hx of DVT in lungs and leg, retinal  disease that causes blindness.  are also affecting patient's functional outcome.  REHAB POTENTIAL: Fair due to complex medical hx.   CLINICAL DECISION MAKING: Unstable/unpredictable  EVALUATION COMPLEXITY: Low   GOALS: Short term PT Goals Target date: 02/05/2023 Pt will be I and compliant with HEP. Baseline:  Goal status: New Pt will decrease pain by 25% overall Baseline: Goal status: New  Long term PT goals Target date: 04/16/2023 Pt will improve ROM to [facility] to improve functional mobility in LE and UE.  Baseline: Goal status: New Pt will improve L hip/knee strength to at least 4/5 MMT to improve functional strength Baseline: Goal status: New Pt will improve FOTO to at least 36% functional to show improved function Baseline: Goal status: NOT MET 01/17/2023, 18.4246% Pt will reduce pain by overall 50% overall with usual activity Baseline: Goal status: MET 01/17/2023 Pt will improve her STS by 2.3 seconds for Minimal clinically important difference.          Baseline:  42.57 sec               Goal status: MET 01/17/2023, 29.60 seconds Pt will be able to ambulate community distances at least 1000 ft WNL gait pattern without complaints Baseline: Goal status: New        7. Pt will improve  TUG by 3.4 seconds or Minimal clinically important difference.         Baseline:         Goal status: 27.06 seconds  8. Pt will be able to walk room to room with a SPC, without complaints         Baseline:        Goal status: NEW 9. Pt would like to improve her balance to be able to lift one leg off the floor for >5 seconds.                    Baseline:                     Goal status: NEW  10. Pt will report a reduction in vertigo symptoms                   Baseline:        Goal status: NEW  PLAN: PT FREQUENCY: 2 times per week   PT DURATION:  8 weeks  PLANNED INTERVENTIONS (unless contraindicated): aquatic PT, Canalith repositioning, cryotherapy, Electrical stimulation, Iontophoresis  with 4 mg/ml dexamethasome, Moist heat, traction, Ultrasound, gait training, Therapeutic exercise, balance training, neuromuscular re-education, patient/family education, prosthetic training, manual techniques, passive ROM, dry needling, taping, vasopnuematic device, vestibular, spinal manipulations, joint manipulations  PLAN FOR NEXT SESSION:  Work on strength of UE/LE, balance, pain modulation and edema, fall prevention and home safety.    Anderson Malta B. Jermia Rigsby, PT 01/22/23 11:59 AM  [facility] [address], [place], [place] [address] Phone # [phone] Fax [phone]

## 2023-01-24 ENCOUNTER — Ambulatory Visit: Payer: Medicare HMO | Admitting: Physical Therapy

## 2023-02-05 ENCOUNTER — Ambulatory Visit: Payer: Medicare HMO

## 2023-02-05 ENCOUNTER — Ambulatory Visit: Payer: Medicare HMO | Attending: Orthopedic Surgery

## 2023-02-05 DIAGNOSIS — R600 Localized edema: Secondary | ICD-10-CM | POA: Insufficient documentation

## 2023-02-05 DIAGNOSIS — M25511 Pain in right shoulder: Secondary | ICD-10-CM | POA: Diagnosis present

## 2023-02-05 DIAGNOSIS — G8929 Other chronic pain: Secondary | ICD-10-CM | POA: Insufficient documentation

## 2023-02-05 DIAGNOSIS — R252 Cramp and spasm: Secondary | ICD-10-CM | POA: Insufficient documentation

## 2023-02-05 DIAGNOSIS — R262 Difficulty in walking, not elsewhere classified: Secondary | ICD-10-CM | POA: Insufficient documentation

## 2023-02-05 DIAGNOSIS — R293 Abnormal posture: Secondary | ICD-10-CM | POA: Diagnosis present

## 2023-02-05 DIAGNOSIS — M6281 Muscle weakness (generalized): Secondary | ICD-10-CM | POA: Insufficient documentation

## 2023-02-05 DIAGNOSIS — M5459 Other low back pain: Secondary | ICD-10-CM | POA: Diagnosis present

## 2023-02-05 DIAGNOSIS — M25512 Pain in left shoulder: Secondary | ICD-10-CM | POA: Insufficient documentation

## 2023-02-05 DIAGNOSIS — R2689 Other abnormalities of gait and mobility: Secondary | ICD-10-CM | POA: Insufficient documentation

## 2023-02-05 NOTE — Therapy (Signed)
OUTPATIENT PHYSICAL THERAPY LOWER EXTREMITY TREATMENT NOTE   Patient Name: Tricia Harrison MRN: [id number] DOB:1946-01-01, 77 y.o., female Today's Date: 01/22/2023  END OF SESSION:  PT End of Session - 01/22/23 1113     Visit Number 11    Number of Visits 26    Date for PT Re-Evaluation 03/07/23    Authorization Type [employer] MEDICARE HMO/PPO    Progress Note Due on Visit 65    PT Start Time 1105    PT Stop Time 1145    PT Time Calculation (min) 40 min    Activity Tolerance Patient tolerated treatment well    Behavior During Therapy WFL for tasks assessed/performed               Past Medical History:  Diagnosis Date   Anemia    as a child   Asthma    as a child   Chronic back pain    spinal stenosis and buldging disc;scoliosis   Chronic constipation    occasionally takes something OTC   Chronic kidney disease    Diabetes mellitus without complication ([place])    per pt "Pre" for 20+yrs   Diverticulosis    DVT (deep venous thrombosis) (HCC)    Gallstones    has known about this for 3-83yr   GERD (gastroesophageal reflux disease)    takes Omeprazole daily   H/O hiatal hernia    Heart murmur    Hemorrhoids    History of blood transfusion    no abnormal reaction noted   History of bronchitis    states its been a long time ago   History of gout    HLD (hyperlipidemia)    takes Fenofibrate daily   HTN (hypertension)    takes Diltiazem and Ramipril daily   Hypothyroidism    takes Synthroid daily   Left knee DJD    Nocturia    Palpitations    started in 2013 and only occasionally;last time noticed about 2-3wks ago and after drinking caffeine   PONV (postoperative nausea and vomiting)    Pulmonary embol[place] with [place]ale [place] 07[place]   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   Past Surgical History:  Procedure Laterality Date   3-day EVENT MONITOR  01/2019   Mostly normal monitor.   Predominantly sinus rhythm (rate range 48-105 bpm, average 67 bpm) 9 short atrial runs (fastest = 13 beats rate 135 bpm, longest ~17 seconds-101 bpm) -> not noted on symptom log.  No sustained arrhythmias (tachycardia or bradycardia), or pauses..  Symptoms are noted with PVCs.   CHOLECYSTECTOMY N/A 01/10/2022   Procedure: LAPAROSCOPIC CHOLECYSTECTOMY WITH INTRAOPERATIVE CHOLANGIOGRAM;  Surgeon: GMichael Boston MD;  Location: [facility];  Service: General;  Laterality: N/A;   COLONOSCOPY     CT ANGIOGRAM OF CHEST - PE PROTOCOL  07/2018   Bilateral nonocclusive pulmonary emboli evidence of right heart strain consistent with "submassive "/intermediate PE.  -->  Code PE called.   DILATION AND CURETTAGE OF UTERUS     multiple about 6-7 times   ESOPHAGOGASTRODUODENOSCOPY     NM VQ LUNG SCAN ([facility] HX)  11/23/2018   Low probability for PE   RIGHT/LEFT HEART CATH AND CORONARY ANGIOGRAPHY N/A 08/19/2018   Procedure: RIGHT/LEFT HEART CATH AND CORONARY ANGIOGRAPHY;  Surgeon: VJettie Booze MD;  [facility];  Service: Cardiovascular:: Nonobstructive CAD.  Mild pulmonary pretension.  Mean PA  pressure 26 mmHg with PA P 48/20 mmHg.  Normal LVEDP.   THYROID SURGERY Right 2010   TOTAL HIP ARTHROPLASTY Left 2005   TOTAL KNEE ARTHROPLASTY Left 02/21/2014   Procedure: TOTAL KNEE ARTHROPLASTY;  Surgeon: Lorn Junes, MD;  Location: [facility];  Service: Orthopedics;  Laterality: Left;   TRANSTHORACIC ECHOCARDIOGRAM  07/2018    (In setting of acute PE) mild LVH.  EF 55 to 60%.  GR 1 DD.  Aortic sclerosis but no stenosis.  Mild regurgitation.  Mild RA dilation.  Moderate LA dilation.  Moderate tricuspid regurgitation with severe primary hypertension estimated PA pressure 71 mmHg.  RV poorly visualized   TRANSTHORACIC ECHOCARDIOGRAM  10/2018 - 11/2019   a) EF 60-65%.  GR 1 DD.  Focal basal LVH. Mod AI/ no AS. Mild LA dilation.  Normal RV size and fxn.  Mod PI w/ mild TR.  Peak PAP ~ 38 mmHg; b) Normal EF 60  to 65%.  Moderate LVH-GR 1 DD.  R WMA.  Mild AS w/ trivial AI.  Grossly normal PA, RV and RA size.  Normal RV function.  Normal PA pressures.   TRANSTHORACIC ECHOCARDIOGRAM  01/2021    EF 55%.  Normal LV function.  Mild LVH.  GR 1 DD.  Normal PA pressures.  Severe LA dilation.  (Consistent with more significant diastolic dysfunction).  Mild-possibly mild to moderate aortic insufficiency.  Borderline elevated RAP.  Trivial pericardial effusion.  (Stable)   VAGINAL HYSTERECTOMY  1979   Patient Active Problem List   Diagnosis Date Noted   Uses wheelchair 01/14/2022   Velopharyngeal dysfunction with mild swallowing difficulty 01/14/2022   Uses roller walker 01/11/2022   Atherosclerosis of aorta ([place]) 01/11/2022   Chronic kidney disease, stage 3b ([place]) 01/11/2022   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   Orthostasis 01/11/2022   Leukocytosis 01/11/2022   Chronic cholecystitis 01/10/2022   Obstructive sleep apnea 03/09/2019   Aortic regurgitation 12/02/2018   History of pulmonary embolism 08/20/2018   DOE (dyspnea on exertion)    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   Bilateral ovarian cysts    Hx of gallstones    Hernia, hiatal    Renal cysts, acquired, bilateral    GERD (gastroesophageal reflux disease)    Diverticulosis    Lumbar spinal stenosis    Diabetes mellitus without complication ([place])    Left knee DJD    Rapid palpitations 12/06/2013   Morbid obesity ([place]) 12/06/2013   Fatigue 12/06/2013   PVC (premature ventricular contraction) 12/06/2013   Essential hypertension    Hypothyroidism    Hyperlipidemia LDL goal <100      REFERRING PROVIDER: Koirala, Dibas, MD  REFERRING DIAG: L knee TKA/ R knee OA  THERAPY DIAG:  Muscle weakness (generalized)  Difficulty in walking, not elsewhere classified  Cramp and spasm  Abnormal posture  Other low back pain  Rationale for Evaluation and Treatment: Rehabilitation  ONSET DATE: Ongoing for multiple years.    SUBJECTIVE:   SUBJECTIVE STATEMENT: Patient arrives accompanied by son.  Son and patient explain that patients right shoulder pain has worsened and she has seen primary MD for this.  He has referred her to ortho surgeon for probably shoulder replacement if she is a candidate.  She has not been able to ambulate much due to the right shoulder pain which has caused her legs to get weak and tight again.   She has appt immediately after her appt today and needs to leave her PT appt  a little early.     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  PAIN:  Are you having pain? Yes: NPRS scale: 8 L knee/10 R knee/10 Pain location: R and L knee.  Pain description: Swelling, stabbing pain Aggravating factors: Standing, turning R knee.  Relieving factors: Rest, [place], heat.  PRECAUTIONS: Fall, Monitor stroke symptoms.   WEIGHT BEARING RESTRICTIONS:  W/C bound due to pain and weakness.   FALLS:  Has patient fallen in last 6 months? Yes. Number of falls 3 in 24 hours. 6+ pt is unable to recall all.   LIVING ENVIRONMENT: Lives with:  Lives with her sister, but son lives a few houses down.  Lives in: House/apartment Stairs: Yes: Internal: 12 steps; on right going up and External: 1 steps; bilateral but cannot reach both Has following equipment at home: Single point cane, Quad cane large base, Walker - 2 wheeled, Walker - 4 wheeled, Shower bench, bed side commode, and Pt has a scooter.   OCCUPATION: Retired  PLOF: Independent  PATIENT GOALS: Pt would like to be able to walk without falling again.   NEXT MD VISIT:   OBJECTIVE:   DIAGNOSTIC FINDINGS: MRI brain (without) demonstrating: - Mild chronic small vessel ischemic disease. - No acute findings.  PATIENT SURVEYS:  FOTO Not taken today due to time restraint.   COGNITION: Overall cognitive status: Within functional limits for tasks assessed   SENSATION: Pt reports feeling her  blood moving and her skin crawling upon waking.   EDEMA:  Circumferential: L:20", R:19.5"; R calf R: 16", L:17".    POSTURE:  Abnormal posture noted throughout body.   PALPATION: Pt reports pain in her L calf with palpation.   FOTO: 18.42%, 36% predicted   LOWER EXTREMITY ROM:  Active ROM Right eval Left eval 01/17/2023 R/L  Hip flexion 50%  25%  70%/50%  Knee flexion 110 100 105/118  Knee extension -30 P:-10 -32/ P:-25 -28/-29  Ankle dorsiflexion 50%  50% 75%/75%   Ankle plantarflexion [facility] WFL WFL   (Blank rows = not tested)  Pt was seated in a Rolator walker.   LOWER EXTREMITY MMT:  MMT Right eval Left eval 01/17/2023 R/L  Hip flexion 4- 3 4/4-  Knee flexion 4- 3+ 4+/4+  Knee extension 4- 3+ 4+/4   (Blank rows = not tested)  Pt demonstrates decreased functional ROM of her L UE due to weakness and pain. She has a tendency to move in a synergetic pattern.   FUNCTIONAL TESTS:  5 times sit to stand: 42.57 sec   GAIT: Distance walked: Pt walks with multiple deficits with her gait. L LE foot drag, stooped posture, decreased knee extension, slow shuffled gait, ect.  Assistive device utilized: Environmental consultant - 2 wheeled Level of assistance: CGA and with WC following behind due to reports of knee's giving out and falling.  Comments: Pt reports fatigue.    TODAY'S TREATMENT:  02/05/23: Discussed all of the multiple orthopedic issues patient is faced with currently with son,  discussed her vertigo still present. Pulleys seated in wheelchair x 5 min flexion only Seated toe and heel raises x 20 both 0# Seated LAQ x 20 each both 0# March x 20 both 0# Patient had to leave early for MD appt  01/22/23: Nustep x 10 min level 4 (PT present to discuss status and progress toward goals) Gait training with rw 85 feet x 2 (second distance was 100 feet) Seated marching x  20 with 3 lbs LAQ 2 x10 with 3 lbs Standing TKE x 10 each standing holding on to walker wit green  tband  01/17/23: Nustep x 10 min level 4  Re-assessment of TUG, STS, strength and ROM Reviewed goals while on NuStep.  Seated marching LAQ x10  STS x5  Transfers to and from [facility] and low mat   PATIENT EDUCATION:  Education details: Educated pt on fall prevention and importance of setting up safe environment at home including using 3 in 1 bedside at night. Reviewed HEP with patient and son. Person educated: Patient and son Education method: Explanation and Demonstration Education comprehension: verbalized understanding, returned demonstration, and needs further education  HOME EXERCISE PROGRAM: Access Code: [email or web address] URL: [email or web address] Date: 12/25/2022 Prepared by: Candyce Churn  Exercises - Seated Long Arc Quad  - 1 x daily - 7 x weekly - 2 sets - 10 reps - Seated March  - 1 x daily - 7 x weekly - 2 sets - 10 reps - Seated Hip Adduction Squeeze with Ball  - 1 x daily - 7 x weekly - 2 sets - 10 reps - 5 hold - Seated Hip Abduction with Resistance  - 1 x daily - 7 x weekly - 3 sets - 10 reps - Sit to Stand with Counter Support  - 1 x daily - 7 x weekly - 2 sets - 5 reps - Seated Knee Extension Stretch with Chair  - 1 x daily - 7 x weekly - 3 sets - 10 reps   ASSESSMENT:  CLINICAL IMPRESSION: Mrs. Kountz has encountered a setback.  She is having severe right shoulder pain and has not been able to walk as much.  She is also having difficulty pushing up to stand and with bed mobility.  We discussed need to keep mobility in the shoulder and suggested pulleys to avoid adhesions while she waits to see ortho MD.  She is also still having symptoms of vertigo.  We suggested to son that we back down to 1 time per week until issues with shoulder are addressed or ortho MD decides on shoulder sx vs PT.    She would benefit from continued skilled PT for LE strengthening and functional training.    OBJECTIVE IMPAIRMENTS: decreased activity tolerance, difficulty walking,  decreased balance, decreased endurance, decreased mobility, decreased ROM, decreased strength, impaired flexibility, impaired UE/LE use, postural dysfunction, and pain.  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  are also affecting patient's functional outcome.  REHAB POTENTIAL: Fair due to complex medical hx.   CLINICAL DECISION MAKING: Unstable/unpredictable  EVALUATION COMPLEXITY: Low   GOALS: Short term PT Goals Target date: 01/31/23 Pt will be I and compliant with HEP. Baseline:  Goal status: New Pt will decrease pain by 25% overall Baseline: Goal status: New  Long term PT goals Target date: 04/01/23 Pt will improve ROM to [facility] to improve functional mobility in LE and UE.  Baseline: Goal status: New Pt will improve L hip/knee strength to at least 4/5 MMT to improve functional strength Baseline: Goal status: New Pt will improve FOTO to at least 36% functional to show improved function Baseline: Goal status: NOT MET 01/17/2023, 18.4246% Pt will reduce pain by overall 50% overall with usual activity Baseline: Goal status: MET 01/17/2023 Pt will improve her STS by 2.3 seconds for Minimal clinically important difference.  Baseline:  42.57 sec               Goal status: MET 01/17/2023, 29.60 seconds Pt will be able to ambulate community distances at least 1000 ft WNL gait pattern without complaints Baseline: Goal status: New        7. Pt will improve TUG by 3.4 seconds or Minimal clinically important difference.         Baseline:         Goal status: 27.06 seconds  8. Pt will be able to walk room to room with a SPC, without complaints         Baseline:        Goal status: NEW 9. Pt would like to improve her balance to be able to lift one leg off the floor for >5 seconds.                    Baseline:                      Goal status: NEW  10. Pt will report a reduction in vertigo symptoms                   Baseline:        Goal status: NEW  PLAN: PT FREQUENCY: 2 times per week   PT DURATION:  8 weeks  PLANNED INTERVENTIONS (unless contraindicated): aquatic PT, Canalith repositioning, cryotherapy, Electrical stimulation, Iontophoresis with 4 mg/ml dexamethasome, Moist heat, traction, Ultrasound, gait training, Therapeutic exercise, balance training, neuromuscular re-education, patient/family education, prosthetic training, manual techniques, passive ROM, dry needling, taping, vasopnuematic device, vestibular, spinal manipulations, joint manipulations  PLAN FOR NEXT SESSION:  Work on strength of UE/LE, balance, pain modulation and edema, fall prevention and home safety.    Anderson Malta B. Jermya Dowding, PT 02/05/23 2:02 PM  [facility] [address] [place], [place] [address] Phone # [phone] Fax [phone]

## 2023-02-07 ENCOUNTER — Telehealth: Payer: Self-pay | Admitting: *Deleted

## 2023-02-07 NOTE — Telephone Encounter (Signed)
   Patient Name: Tricia Harrison  DOB: 1946-01-10 MRN: [id number]  Primary Cardiologist: Glenetta Hew, MD  Chart reviewed as part of pre-operative protocol coverage.   Patient is on Eliquis for history of PE.  Eliquis is managed by patient's PCP, not cardiology. Therefore, recommendations on holding Eliquis prior to surgery should come from managing provider.   I will route this recommendation to the requesting party via Epic fax function and remove from pre-op pool.  Please call with questions.  Lenna Sciara, NP 02/07/2023, 2:33 PM

## 2023-02-07 NOTE — Telephone Encounter (Signed)
   Pre-operative Risk Assessment    Patient Name: Tricia Harrison  DOB: 09-25-1946 MRN: [id number]      Request for Surgical Clearance    Procedure:   RIGHT REVERSE TOTAL SHOULDER REPLACEMENT  Date of Surgery:  Clearance TBD                                 Surgeon:  Marchia Bond, MD Surgeon's Group or Practice Name:  [facility] Phone number:  [phone] Fax number:  [phone]   Type of Clearance Requested:   - Pharmacy:  Hold Apixaban (Eliquis) NOT INDICATED HOW LONG   Type of Anesthesia:  General    Additional requests/questions:    Astrid Divine   02/07/2023, 2:26 PM

## 2023-02-10 ENCOUNTER — Encounter: Payer: Medicare HMO | Admitting: Physical Therapy

## 2023-02-12 ENCOUNTER — Telehealth: Payer: Self-pay

## 2023-02-12 ENCOUNTER — Ambulatory Visit: Payer: Medicare HMO

## 2023-02-12 DIAGNOSIS — M6281 Muscle weakness (generalized): Secondary | ICD-10-CM | POA: Diagnosis not present

## 2023-02-12 DIAGNOSIS — R262 Difficulty in walking, not elsewhere classified: Secondary | ICD-10-CM

## 2023-02-12 DIAGNOSIS — M25511 Pain in right shoulder: Secondary | ICD-10-CM

## 2023-02-12 DIAGNOSIS — G8929 Other chronic pain: Secondary | ICD-10-CM

## 2023-02-12 DIAGNOSIS — R252 Cramp and spasm: Secondary | ICD-10-CM

## 2023-02-12 DIAGNOSIS — R293 Abnormal posture: Secondary | ICD-10-CM

## 2023-02-12 NOTE — Therapy (Signed)
OUTPATIENT PHYSICAL THERAPY LOWER EXTREMITY TREATMENT NOTE   Patient Name: Tricia Harrison MRN: [id number] DOB:May 06, 1946, 77 y.o., female Today's Date: 02/12/2023  END OF SESSION:  PT End of Session - 02/12/23 1424     Visit Number 13    Number of Visits 26    Date for PT Re-Evaluation 03/07/23    Authorization Type [employer] MEDICARE HMO/PPO    Progress Note Due on Visit 20    PT Start Time 0930    PT Stop Time 1015    PT Time Calculation (min) 45 min    Activity Tolerance Patient limited by pain    Behavior During Therapy WFL for tasks assessed/performed               Past Medical History:  Diagnosis Date   Anemia    as a child   Asthma    as a child   Chronic back pain    spinal stenosis and buldging disc;scoliosis   Chronic constipation    occasionally takes something OTC   Chronic kidney disease    Diabetes mellitus without complication ([place])    per pt "Pre" for 20+yrs   Diverticulosis    DVT (deep venous thrombosis) ([facility])    Gallstones    has known about this for 3-43yrs   GERD (gastroesophageal reflux disease)    takes Omeprazole daily   H/O hiatal hernia    Heart murmur    Hemorrhoids    History of blood transfusion    no abnormal reaction noted   History of bronchitis    states its been a long time ago   History of gout    HLD (hyperlipidemia)    takes Fenofibrate daily   HTN (hypertension)    takes Diltiazem and Ramipril daily   Hypothyroidism    takes Synthroid daily   Left knee DJD    Nocturia    Palpitations    started in 2013 and only occasionally;last time noticed about 2-3wks ago and after drinking caffeine   PONV (postoperative nausea and vomiting)    Pulmonary embolism with acute cor pulmonale ([place]) 07/2018   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   Past Surgical History:  Procedure Laterality Date   3-day EVENT MONITOR  01/2019   Mostly normal monitor.   Predominantly sinus rhythm (rate range 48-105 bpm, average 67 bpm) 9 short atrial runs (fastest = 13 beats rate 135 bpm, longest ~17 seconds-101 bpm) -> not noted on symptom log.  No sustained arrhythmias (tachycardia or bradycardia), or pauses..  Symptoms are noted with PVCs.   CHOLECYSTECTOMY N/A 01/10/2022   Procedure: LAPAROSCOPIC CHOLECYSTECTOMY WITH INTRAOPERATIVE CHOLANGIOGRAM;  Surgeon: Michael Boston, MD;  Location: [facility];  Service: General;  Laterality: N/A;   COLONOSCOPY     CT ANGIOGRAM OF CHEST - PE PROTOCOL  07/2018   Bilateral nonocclusive pulmonary emboli evidence of right heart strain consistent with "submassive "/intermediate PE.  -->  Code PE called.   DILATION AND CURETTAGE OF UTERUS     multiple about 6-7 times   ESOPHAGOGASTRODUODENOSCOPY     NM VQ LUNG SCAN ([facility])  11/23/2018   Low probability for PE   RIGHT/LEFT HEART CATH AND CORONARY ANGIOGRAPHY N/A 08/19/2018   Procedure: RIGHT/LEFT HEART CATH AND CORONARY ANGIOGRAPHY;  Surgeon: Jettie Booze, MD;  Location: [facility];  Service: Cardiovascular:: Nonobstructive CAD.  Mild pulmonary pretension.  Mean PA  pressure 26 mmHg with PA P 48/20 mmHg.  Normal LVEDP.   THYROID SURGERY Right 2010   TOTAL HIP ARTHROPLASTY Left 2005   TOTAL KNEE ARTHROPLASTY Left 02/21/2014   Procedure: TOTAL KNEE ARTHROPLASTY;  Surgeon: Lorn Junes, MD;  Location: [facility];  Service: Orthopedics;  Laterality: Left;   TRANSTHORACIC ECHOCARDIOGRAM  07/2018    (In setting of acute PE) mild LVH.  EF 55 to 60%.  GR 1 DD.  Aortic sclerosis but no stenosis.  Mild regurgitation.  Mild RA dilation.  Moderate LA dilation.  Moderate tricuspid regurgitation with severe primary hypertension estimated PA pressure 71 mmHg.  RV poorly visualized   TRANSTHORACIC ECHOCARDIOGRAM  10/2018 - 11/2019   a) EF 60-65%.  GR 1 DD.  Focal basal LVH. Mod AI/ no AS. Mild LA dilation.  Normal RV size and fxn.  Mod PI w/ mild TR.  Peak PAP ~ 38 mmHg; b) Normal EF 60  to 65%.  Moderate LVH-GR 1 DD.  R WMA.  Mild AS w/ trivial AI.  Grossly normal PA, RV and RA size.  Normal RV function.  Normal PA pressures.   TRANSTHORACIC ECHOCARDIOGRAM  01/2021    EF 55%.  Normal LV function.  Mild LVH.  GR 1 DD.  Normal PA pressures.  Severe LA dilation.  (Consistent with more significant diastolic dysfunction).  Mild-possibly mild to moderate aortic insufficiency.  Borderline elevated RAP.  Trivial pericardial effusion.  (Stable)   VAGINAL HYSTERECTOMY  1979   Patient Active Problem List   Diagnosis Date Noted   Uses wheelchair 01/14/2022   Velopharyngeal dysfunction with mild swallowing difficulty 01/14/2022   Uses roller walker 01/11/2022   Atherosclerosis of aorta ([place]) 01/11/2022   Chronic kidney disease, stage 3b ([place]) 01/11/2022   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   Orthostasis 01/11/2022   Leukocytosis 01/11/2022   Chronic cholecystitis 01/10/2022   Obstructive sleep apnea 03/09/2019   Aortic regurgitation 12/02/2018   History of pulmonary embolism 08/20/2018   DOE (dyspnea on exertion)    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   Bilateral ovarian cysts    Hx of gallstones    Hernia, hiatal    Renal cysts, acquired, bilateral    GERD (gastroesophageal reflux disease)    Diverticulosis    Lumbar spinal stenosis    Diabetes mellitus without complication ([place])    Left knee DJD    Rapid palpitations 12/06/2013   Morbid obesity ([place]) 12/06/2013   Fatigue 12/06/2013   PVC (premature ventricular contraction) 12/06/2013   Essential hypertension    Hypothyroidism    Hyperlipidemia LDL goal <100      REFERRING PROVIDER: Koirala, Dibas, MD  REFERRING DIAG: L knee TKA/ R knee OA  THERAPY DIAG:  Acute pain of right shoulder  Muscle weakness (generalized)  Difficulty in walking, not elsewhere classified  Cramp and spasm  Chronic left shoulder pain  Abnormal posture  Rationale for Evaluation and Treatment:  Rehabilitation  ONSET DATE: Ongoing for multiple years.   SUBJECTIVE:   SUBJECTIVE STATEMENT: Patient arrives accompanied by sister.  Per patient, she saw MD and he wants to move forward with the right shoulder replacement but insurance is dictating that she do 6 weeks of PT before surgery or that the PT determines that the therapy has reached max potential.  She states that the cortisone shot really did calm it down to where it is tolerable but she still really isn't able to use her right arm for much at all.  She also explains that she had an episode where she walked down the hall to the bathroom, stood for a little bit while her sister helped her dress and then she told her sister that she could finish up and get herself dressed.  She was standing and her right knee buckled.  She did not go all the way down and had to call for her sister to help her keep from falling.    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  PAIN:  Are you having pain? Yes: NPRS scale: 8 L knee/10 R knee/10 Pain location: R and L knee.  Pain description: Swelling, stabbing pain Aggravating factors: Standing, turning R knee.  Relieving factors: Rest, [place], heat.  PRECAUTIONS: Fall, Monitor stroke symptoms.   WEIGHT BEARING RESTRICTIONS:  W/C bound due to pain and weakness.   FALLS:  Has patient fallen in last 6 months? Yes. Number of falls 3 in 24 hours. 6+ pt is unable to recall all.   LIVING ENVIRONMENT: Lives with:  Lives with her sister, but son lives a few houses down.  Lives in: House/apartment Stairs: Yes: Internal: 12 steps; on right going up and External: 1 steps; bilateral but cannot reach both Has following equipment at home: Single point cane, Quad cane large base, Walker - 2 wheeled, Walker - 4 wheeled, Shower bench, bed side commode, and Pt has a scooter.   OCCUPATION: Retired  PLOF: Independent  PATIENT GOALS: Pt would like to be able to  walk without falling again.   NEXT MD VISIT:   OBJECTIVE:   DIAGNOSTIC FINDINGS: MRI brain (without) demonstrating: - Mild chronic small vessel ischemic disease. - No acute findings.  PATIENT SURVEYS:  FOTO Not taken today due to time restraint.   COGNITION: Overall cognitive status: Within functional limits for tasks assessed   SENSATION: Pt reports feeling her blood moving and her skin crawling upon waking.   EDEMA:  Circumferential: L:20", R:19.5"; R calf R: 16", L:17".    POSTURE:  Abnormal posture noted throughout body.   PALPATION: Pt reports pain in her L calf with palpation.   FOTO: 18.42%, 36% predicted   LOWER EXTREMITY ROM:  Active ROM Right eval Left eval 01/17/2023 R/L  Hip flexion 50%  25%  70%/50%  Knee flexion 110 100 105/118  Knee extension -30 P:-10 -32/ P:-25 -28/-29  Ankle dorsiflexion 50%  50% 75%/75%   Ankle plantarflexion [facility] WFL WFL   (Blank rows = not tested)  Pt was seated in a Rolator walker.   LOWER EXTREMITY MMT:  MMT Right eval Left eval 01/17/2023 R/L  Hip flexion 4- 3 4/4-  Knee flexion 4- 3+ 4+/4+  Knee extension 4- 3+ 4+/4   (Blank rows = not tested)  Pt demonstrates decreased functional ROM of her L UE due to weakness and pain. She has a tendency to move in a synergetic pattern.   FUNCTIONAL TESTS:  5 times sit to stand: 42.57 sec   GAIT: Distance walked: Pt walks with multiple deficits with her gait. L LE foot drag, stooped posture, decreased knee extension, slow shuffled gait, ect.  Assistive device utilized: Environmental consultant - 2 wheeled Level of assistance: CGA and with WC following behind due to reports of knee's giving out and falling.  Comments: Pt reports fatigue.    TODAY'S TREATMENT:  02/12/23: Discussed MD visit with patient and sister, patient states MD wants to do surgery and does not want her to do  PT but insurance dictates that she do 6 weeks of PT first.   Pulleys for AAROM: flex, scaption x 2 min each Call  placed to son and dtr in law to clarify information from MD f/u Discussed with patient the concern over the recovery time for her shoulder interfering with her ability to ambulate and if she would be able to safely ambulate once she had acceptable right shoulder strength following surgery.  Educated patient on precautions and restrictions typical for post TSA.  Suggested patient be extremely diligent with her propping for extension and ALL LE exercises provided in HEP to gain as much strength as possible prior to surgery if she and family decide this is the route they will take.  Warned patient about prolonged standing and how her end stage OA in right knee if aggrevated and painful, can cause instability moments like she experienced yesterday and could result in falling.    02/05/23: Discussed all of the multiple orthopedic issues patient is faced with currently with son,  discussed her vertigo still present. Pulleys seated in wheelchair x 5 min flexion only Seated toe and heel raises x 20 both 0# Seated LAQ x 20 each both 0# March x 20 both 0# Patient had to leave early for MD appt  01/22/23: Nustep x 10 min level 4 (PT present to discuss status and progress toward goals) Gait training with rw 85 feet x 2 (second distance was 100 feet) Seated marching x 20 with 3 lbs LAQ 2 x10 with 3 lbs Standing TKE x 10 each standing holding on to walker wit green tband   PATIENT EDUCATION:  Education details: Educated pt on fall prevention and importance of setting up safe environment at home including using 3 in 1 bedside at night. Reviewed HEP with patient and son. Person educated: Patient and son Education method: Explanation and Demonstration Education comprehension: verbalized understanding, returned demonstration, and needs further education  HOME EXERCISE PROGRAM: Access Code: [email or web address] URL: [email or web address] Date: 12/25/2022 Prepared by: Candyce Churn  Exercises -  Seated Long Arc Quad  - 1 x daily - 7 x weekly - 2 sets - 10 reps - Seated March  - 1 x daily - 7 x weekly - 2 sets - 10 reps - Seated Hip Adduction Squeeze with Ball  - 1 x daily - 7 x weekly - 2 sets - 10 reps - 5 hold - Seated Hip Abduction with Resistance  - 1 x daily - 7 x weekly - 3 sets - 10 reps - Sit to Stand with Counter Support  - 1 x daily - 7 x weekly - 2 sets - 5 reps - Seated Knee Extension Stretch with Chair  - 1 x daily - 7 x weekly - 3 sets - 10 reps   ASSESSMENT:  CLINICAL IMPRESSION: Mrs. Dempewolf saw MD about shoulder.  It is suggested that she move forward with surgery.  However, insurance will not approve surgery unless patient attempts at least 6 weeks of PT.  We did thoroughly explain to patient and sister about concerns over patient losing ability to ambulate while she recovers from shoulder surgery.  Patient will have to be very diligent about LE HEP and functional exercises to prepare for surgery and down time while healing from shoulder surgery.     She would benefit from continued skilled PT for LE strengthening and functional training along with shoulder PROM and AAROM and strengthening.     OBJECTIVE IMPAIRMENTS: decreased activity tolerance, difficulty walking, decreased  balance, decreased endurance, decreased mobility, decreased ROM, decreased strength, impaired flexibility, impaired UE/LE use, postural dysfunction, and pain.  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  are also affecting patient's functional outcome.  REHAB POTENTIAL: Fair due to complex medical hx.   CLINICAL DECISION MAKING: Unstable/unpredictable  EVALUATION COMPLEXITY: Low   GOALS: Short term PT Goals Target date: 01/31/23 Pt will be I and compliant with HEP. Baseline:  Goal status: New Pt will decrease pain by 25%  overall Baseline: Goal status: New  Long term PT goals Target date: 04/01/23 Pt will improve ROM to [facility] to improve functional mobility in LE and UE.  Baseline: Goal status: New Pt will improve L hip/knee strength to at least 4/5 MMT to improve functional strength Baseline: Goal status: New Pt will improve FOTO to at least 36% functional to show improved function Baseline: Goal status: NOT MET 01/17/2023, 18.4246% Pt will reduce pain by overall 50% overall with usual activity Baseline: Goal status: MET 01/17/2023 Pt will improve her STS by 2.3 seconds for Minimal clinically important difference.          Baseline:  42.57 sec               Goal status: MET 01/17/2023, 29.60 seconds Pt will be able to ambulate community distances at least 1000 ft WNL gait pattern without complaints Baseline: Goal status: New        7. Pt will improve TUG by 3.4 seconds or Minimal clinically important difference.         Baseline:         Goal status: 27.06 seconds  8. Pt will be able to walk room to room with a SPC, without complaints         Baseline:        Goal status: NEW 9. Pt would like to improve her balance to be able to lift one leg off the floor for >5 seconds.                    Baseline:                     Goal status: NEW  10. Pt will report a reduction in vertigo symptoms                   Baseline:        Goal status: NEW  PLAN: PT FREQUENCY: 2 times per week   PT DURATION:  8 weeks  PLANNED INTERVENTIONS (unless contraindicated): aquatic PT, Canalith repositioning, cryotherapy, Electrical stimulation, Iontophoresis with 4 mg/ml dexamethasome, Moist heat, traction, Ultrasound, gait training, Therapeutic exercise, balance training, neuromuscular re-education, patient/family education, prosthetic training, manual techniques, passive ROM, dry needling, taping, vasopnuematic device, vestibular, spinal manipulations, joint manipulations  PLAN FOR NEXT SESSION:  Work on strength of  UE/LE, balance, pain modulation and edema, fall prevention and home safety.    Anderson Malta B. Dorsie Sethi, PT 02/12/23 9:53 PM  [facility] [address], [place], [place] [address] Phone # [phone] Fax [phone]

## 2023-02-12 NOTE — Telephone Encounter (Signed)
Call placed to Dr. Luanna Cole office to discuss patient status .  Message left with assistant.  Explained that patient was not likely to regain functional use of her right shoulder with PT due to pain with simple ROM.  She is also losing ability to ambulate with walker due to pain in right shoulder.  Explained that we will continue with attempts at PT until surgery is approved by insurance for optimal ROM and strength prior to surgery.

## 2023-02-13 ENCOUNTER — Telehealth: Payer: Self-pay | Admitting: *Deleted

## 2023-02-13 NOTE — Telephone Encounter (Signed)
Received clearance request from Raliegh Ip for patient to have the following:  Right Reverse Total Shoulder Replacement under general anesthesia.   Dr Rexene Alberts completed form indicating low risk from neurology standpoint. She hasn't seen the patient except for once in 08/2022 and the 09/2022 MRI showed no acute findings.  Form and office note faxed back to [employer]. Received a receipt of confirmation.

## 2023-02-13 NOTE — Therapy (Signed)
OUTPATIENT PHYSICAL THERAPY LOWER EXTREMITY TREATMENT NOTE    Patient Name: Tricia Harrison MRN: [id number] DOB:[birth year], 77 y.o., female Today's Date: 02/17/2023  END OF SESSION:  PT End of Session - 02/17/23 1148     Visit Number 14    Number of Visits 26    Date for PT Re-Evaluation 03/07/23    Authorization Type [employer] MEDICARE HMO/PPO    Progress Note Due on Visit 20    PT Start Time 1100    PT Stop Time 1142    PT Time Calculation (min) 42 min    Activity Tolerance Patient limited by pain    Behavior During Therapy WFL for tasks assessed/performed                Past Medical History:  Diagnosis Date   Anemia    as a child   Asthma    as a child   Chronic back pain    spinal stenosis and buldging disc;scoliosis   Chronic constipation    occasionally takes something OTC   Chronic kidney disease    Diabetes mellitus without complication ([place])    per pt "Pre" for 20+yrs   Diverticulosis    DVT (deep venous thrombosis) ([facility])    Gallstones    has known about this for 3-79yrs   GERD (gastroesophageal reflux disease)    takes Omeprazole daily   H/O hiatal hernia    Heart murmur    Hemorrhoids    History of blood transfusion    no abnormal reaction noted   History of bronchitis    states its been a long time ago   History of gout    HLD (hyperlipidemia)    takes Fenofibrate daily   HTN (hypertension)    takes Diltiazem and Ramipril daily   Hypothyroidism    takes Synthroid daily   Left knee DJD    Nocturia    Palpitations    started in 2013 and only occasionally;last time noticed about 2-3wks ago and after drinking caffeine   PONV (postoperative nausea and vomiting)    Pulmonary embolism with acute cor pulmonale ([place]) 07/2018   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   Past Surgical History:  Procedure Laterality Date   3-day EVENT MONITOR  01/2019   Mostly normal monitor.   Predominantly sinus rhythm (rate range 48-105 bpm, average 67 bpm) 9 short atrial runs (fastest = 13 beats rate 135 bpm, longest ~17 seconds-101 bpm) -> not noted on symptom log.  No sustained arrhythmias (tachycardia or bradycardia), or pauses..  Symptoms are noted with PVCs.   CHOLECYSTECTOMY N/A 01/10/2022   Procedure: LAPAROSCOPIC CHOLECYSTECTOMY WITH INTRAOPERATIVE CHOLANGIOGRAM;  Surgeon: Michael Boston, MD;  Location: [facility];  Service: General;  Laterality: N/A;   COLONOSCOPY     CT ANGIOGRAM OF CHEST - PE PROTOCOL  07/2018   Bilateral nonocclusive pulmonary emboli evidence of right heart strain consistent with "submassive "/intermediate PE.  -->  Code PE called.   DILATION AND CURETTAGE OF UTERUS     multiple about 6-7 times   ESOPHAGOGASTRODUODENOSCOPY     NM VQ LUNG SCAN ([facility])  11/23/2018   Low probability for PE   RIGHT/LEFT HEART CATH AND CORONARY ANGIOGRAPHY N/A 08/19/2018   Procedure: RIGHT/LEFT HEART CATH AND CORONARY ANGIOGRAPHY;  Surgeon: Jettie Booze, MD;  Location: [facility];  Service: Cardiovascular:: Nonobstructive CAD.  Mild pulmonary pretension.  Mean PA pressure 26 mmHg with PA P 48/20 mmHg.  Normal LVEDP.   THYROID SURGERY Right 2010   TOTAL HIP ARTHROPLASTY Left 2005   TOTAL KNEE ARTHROPLASTY Left 02/21/2014   Procedure: TOTAL KNEE ARTHROPLASTY;  Surgeon: Lorn Junes, MD;  Location: [facility];  Service: Orthopedics;  Laterality: Left;   TRANSTHORACIC ECHOCARDIOGRAM  07/2018    (In setting of acute PE) mild LVH.  EF 55 to 60%.  GR 1 DD.  Aortic sclerosis but no stenosis.  Mild regurgitation.  Mild RA dilation.  Moderate LA dilation.  Moderate tricuspid regurgitation with severe primary hypertension estimated PA pressure 71 mmHg.  RV poorly visualized   TRANSTHORACIC ECHOCARDIOGRAM  10/2018 - 11/2019   a) EF 60-65%.  GR 1 DD.  Focal basal LVH. Mod AI/ no AS. Mild LA dilation.  Normal RV size and fxn.  Mod PI w/ mild TR.  Peak PAP ~ 38 mmHg; b) Normal EF 60  to 65%.  Moderate LVH-GR 1 DD.  R WMA.  Mild AS w/ trivial AI.  Grossly normal PA, RV and RA size.  Normal RV function.  Normal PA pressures.   TRANSTHORACIC ECHOCARDIOGRAM  01/2021    EF 55%.  Normal LV function.  Mild LVH.  GR 1 DD.  Normal PA pressures.  Severe LA dilation.  (Consistent with more significant diastolic dysfunction).  Mild-possibly mild to moderate aortic insufficiency.  Borderline elevated RAP.  Trivial pericardial effusion.  (Stable)   VAGINAL HYSTERECTOMY  1979   Patient Active Problem List   Diagnosis Date Noted   Uses wheelchair 01/14/2022   Velopharyngeal dysfunction with mild swallowing difficulty 01/14/2022   Uses roller walker 01/11/2022   Atherosclerosis of aorta ([place]) 01/11/2022   Chronic kidney disease, stage 3b ([place]) 01/11/2022   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   Orthostasis 01/11/2022   Leukocytosis 01/11/2022   Chronic cholecystitis 01/10/2022   Obstructive sleep apnea 03/09/2019   Aortic regurgitation 12/02/2018   History of pulmonary embolism 08/20/2018   DOE (dyspnea on exertion)    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   Bilateral ovarian cysts    Hx of gallstones    Hernia, hiatal    Renal cysts, acquired, bilateral    GERD (gastroesophageal reflux disease)    Diverticulosis    Lumbar spinal stenosis    Diabetes mellitus without complication ([place])    Left knee DJD    Rapid palpitations 12/06/2013   Morbid obesity ([place]) 12/06/2013   Fatigue 12/06/2013   PVC (premature ventricular contraction) 12/06/2013   Essential hypertension    Hypothyroidism    Hyperlipidemia LDL goal <100      REFERRING PROVIDER: Koirala, Dibas, MD  REFERRING DIAG: L knee TKA/ R knee OA  THERAPY DIAG:  No diagnosis found.  Rationale for Evaluation and Treatment: Rehabilitation  ONSET DATE: Ongoing for multiple years.   SUBJECTIVE:   SUBJECTIVE STATEMENT: Patient states that she is active with her HEP and is terrified of regression.  She has fallen 3x in the last 30 hours. Her granddaughter is present with her today.   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  PAIN:  Are you having pain? Yes: NPRS scale: 8 L knee/10 R knee/10 Pain location: R and L knee.  Pain description: Swelling, stabbing pain Aggravating factors: Standing, turning R knee.  Relieving factors: Rest, [place], heat.  PRECAUTIONS: Fall, Monitor stroke symptoms.   WEIGHT BEARING RESTRICTIONS:  W/C bound due  to pain and weakness.   FALLS:  Has patient fallen in last 6 months? Yes. Number of falls 3 in 24 hours. 6+ pt is unable to recall all.   LIVING ENVIRONMENT: Lives with:  Lives with her sister, but son lives a few houses down.  Lives in: House/apartment Stairs: Yes: Internal: 12 steps; on right going up and External: 1 steps; bilateral but cannot reach both Has following equipment at home: Single point cane, Quad cane large base, Walker - 2 wheeled, Walker - 4 wheeled, Shower bench, bed side commode, and Pt has a scooter.   OCCUPATION: Retired  PLOF: Independent  PATIENT GOALS: Pt would like to be able to walk without falling again.   NEXT MD VISIT:   OBJECTIVE:   DIAGNOSTIC FINDINGS: MRI brain (without) demonstrating: - Mild chronic small vessel ischemic disease. - No acute findings.  PATIENT SURVEYS:  FOTO Not taken today due to time restraint.   COGNITION: Overall cognitive status: Within functional limits for tasks assessed   SENSATION: Pt reports feeling her blood moving and her skin crawling upon waking.   EDEMA:  Circumferential: L:20", R:19.5"; R calf R: 16", L:17".    POSTURE:  Abnormal posture noted throughout body.   PALPATION: Pt reports pain in her L calf with palpation.   FOTO: 18.42%, 36% predicted   LOWER EXTREMITY ROM:  Active ROM Right eval Left eval 01/17/2023 R/L  Hip flexion 50%  25%  70%/50%  Knee flexion 110 100 105/118  Knee extension  -30 P:-10 -32/ P:-25 -28/-29  Ankle dorsiflexion 50%  50% 75%/75%   Ankle plantarflexion [facility] WFL WFL   (Blank rows = not tested)  Pt was seated in a Rolator walker.   LOWER EXTREMITY MMT:  MMT Right eval Left eval 01/17/2023 R/L  Hip flexion 4- 3 4/4-  Knee flexion 4- 3+ 4+/4+  Knee extension 4- 3+ 4+/4   (Blank rows = not tested)  Pt demonstrates decreased functional ROM of her L UE due to weakness and pain. She has a tendency to move in a synergetic pattern.   FUNCTIONAL TESTS:  5 times sit to stand: 42.57 sec   GAIT: Distance walked: Pt walks with multiple deficits with her gait. L LE foot drag, stooped posture, decreased knee extension, slow shuffled gait, ect.  Assistive device utilized: Environmental consultant - 2 wheeled Level of assistance: CGA and with WC following behind due to reports of knee's giving out and falling.  Comments: Pt reports fatigue.    TODAY'S TREATMENT:  02/17/2023:  Nustep 15 minutes while discussing surgery benefits and risks with secondary weakness in LE's.  Pulleys for 3 min to tolerance  Seated LAQ  Seated hip adduction Discussed trying aquatic therapy prior to surgery to increase strength and mobility.  02/12/23: Discussed MD visit with patient and sister, patient states MD wants to do surgery and does not want her to do PT but insurance dictates that she do 6 weeks of PT first.   Pulleys for AAROM: flex, scaption x 2 min each Call placed to son and dtr in law to clarify information from MD f/u Discussed with patient the concern over the recovery time for her shoulder interfering with her ability to ambulate and if she would be able to safely ambulate once she had acceptable right shoulder strength following surgery.  Educated patient on precautions and restrictions typical for post TSA.  Suggested patient be extremely diligent with her propping for extension and ALL LE exercises provided in HEP to gain  as much strength as possible prior to surgery if she and  family decide this is the route they will take.  Warned patient about prolonged standing and how her end stage OA in right knee if aggrevated and painful, can cause instability moments like she experienced yesterday and could result in falling.    02/05/23: Discussed all of the multiple orthopedic issues patient is faced with currently with son,  discussed her vertigo still present. Pulleys seated in wheelchair x 5 min flexion only Seated toe and heel raises x 20 both 0# Seated LAQ x 20 each both 0# March x 20 both 0# Patient had to leave early for MD appt  01/22/23: Nustep x 10 min level 4 (PT present to discuss status and progress toward goals) Gait training with rw 85 feet x 2 (second distance was 100 feet) Seated marching x 20 with 3 lbs LAQ 2 x10 with 3 lbs Standing TKE x 10 each standing holding on to walker wit green tband   PATIENT EDUCATION:  Education details: Educated pt on fall prevention and importance of setting up safe environment at home including using 3 in 1 bedside at night. Reviewed HEP with patient and son. Person educated: Patient and son Education method: Explanation and Demonstration Education comprehension: verbalized understanding, returned demonstration, and needs further education  HOME EXERCISE PROGRAM: Access Code: [email or web address] URL: [email or web address] Date: 12/25/2022 Prepared by: Candyce Churn  Exercises - Seated Long Arc Quad  - 1 x daily - 7 x weekly - 2 sets - 10 reps - Seated March  - 1 x daily - 7 x weekly - 2 sets - 10 reps - Seated Hip Adduction Squeeze with Ball  - 1 x daily - 7 x weekly - 2 sets - 10 reps - 5 hold - Seated Hip Abduction with Resistance  - 1 x daily - 7 x weekly - 3 sets - 10 reps - Sit to Stand with Counter Support  - 1 x daily - 7 x weekly - 2 sets - 5 reps - Seated Knee Extension Stretch with Chair  - 1 x daily - 7 x weekly - 3 sets - 10 reps   ASSESSMENT:  CLINICAL IMPRESSION: Mrs. Brye presents to  PT with continued concerns about her R shoulder and LE strength and balance. She reports falling 3x in the past 30 hours. Continued discussion about getting shoulder surgery due to medical necessity while continuing with LE strengthening and mobility to reduce regression and fall risk. Pt is highly motivated to recover and continue with both UE and LE strengthening as tolerated. She is active with her current HEP and has a lot of family support. She also recently got an aide to help out at home as needed. Pt tolerated therex today with assistance needed with transfers. Pt will continue to benefit from skilled PT to address continued deficits.   OBJECTIVE IMPAIRMENTS: decreased activity tolerance, difficulty walking, decreased balance, decreased endurance, decreased mobility, decreased ROM, decreased strength, impaired flexibility, impaired UE/LE use, postural dysfunction, and pain.  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  are also affecting patient's functional outcome.  REHAB POTENTIAL: Fair due to complex medical hx.   CLINICAL DECISION MAKING: Unstable/unpredictable  EVALUATION COMPLEXITY: Low   GOALS: Short term PT Goals Target date: 01/31/23 Pt will be I and compliant with HEP. Baseline:  Goal status: New Pt  will decrease pain by 25% overall Baseline: Goal status: New  Long term PT goals Target date: 04/01/23 Pt will improve ROM to [facility] to improve functional mobility in LE and UE.  Baseline: Goal status: New Pt will improve L hip/knee strength to at least 4/5 MMT to improve functional strength Baseline: Goal status: New Pt will improve FOTO to at least 36% functional to show improved function Baseline: Goal status: NOT MET 01/17/2023, 18.4246% Pt will reduce pain by overall 50% overall with usual  activity Baseline: Goal status: MET 01/17/2023 Pt will improve her STS by 2.3 seconds for Minimal clinically important difference.          Baseline:  42.57 sec               Goal status: MET 01/17/2023, 29.60 seconds Pt will be able to ambulate community distances at least 1000 ft WNL gait pattern without complaints Baseline: Goal status: New        7. Pt will improve TUG by 3.4 seconds or Minimal clinically important difference.         Baseline:         Goal status: 27.06 seconds  8. Pt will be able to walk room to room with a SPC, without complaints         Baseline:        Goal status: NEW 9. Pt would like to improve her balance to be able to lift one leg off the floor for >5 seconds.                    Baseline:                     Goal status: NEW  10. Pt will report a reduction in vertigo symptoms                   Baseline:        Goal status: NEW  PLAN: PT FREQUENCY: 2 times per week   PT DURATION:  8 weeks  PLANNED INTERVENTIONS (unless contraindicated): aquatic PT, Canalith repositioning, cryotherapy, Electrical stimulation, Iontophoresis with 4 mg/ml dexamethasome, Moist heat, traction, Ultrasound, gait training, Therapeutic exercise, balance training, neuromuscular re-education, patient/family education, prosthetic training, manual techniques, passive ROM, dry needling, taping, vasopnuematic device, vestibular, spinal manipulations, joint manipulations  PLAN FOR NEXT SESSION:  Work on strength of UE/LE, balance, pain modulation and edema, fall prevention and home safety.   Rudi Heap PT, DPT 02/17/23  11:49 AM

## 2023-02-17 ENCOUNTER — Ambulatory Visit: Payer: Medicare HMO | Admitting: Physical Therapy

## 2023-02-17 ENCOUNTER — Encounter: Payer: Self-pay | Admitting: Physical Therapy

## 2023-02-17 DIAGNOSIS — R262 Difficulty in walking, not elsewhere classified: Secondary | ICD-10-CM

## 2023-02-17 DIAGNOSIS — R2689 Other abnormalities of gait and mobility: Secondary | ICD-10-CM

## 2023-02-17 DIAGNOSIS — G8929 Other chronic pain: Secondary | ICD-10-CM

## 2023-02-17 DIAGNOSIS — M6281 Muscle weakness (generalized): Secondary | ICD-10-CM

## 2023-02-17 DIAGNOSIS — R252 Cramp and spasm: Secondary | ICD-10-CM

## 2023-02-17 DIAGNOSIS — M5459 Other low back pain: Secondary | ICD-10-CM

## 2023-02-17 DIAGNOSIS — M25511 Pain in right shoulder: Secondary | ICD-10-CM

## 2023-02-17 DIAGNOSIS — R293 Abnormal posture: Secondary | ICD-10-CM

## 2023-02-17 DIAGNOSIS — R600 Localized edema: Secondary | ICD-10-CM

## 2023-02-25 ENCOUNTER — Ambulatory Visit: Payer: Medicare HMO | Attending: Family Medicine

## 2023-02-25 DIAGNOSIS — M25511 Pain in right shoulder: Secondary | ICD-10-CM | POA: Diagnosis present

## 2023-02-25 DIAGNOSIS — M25512 Pain in left shoulder: Secondary | ICD-10-CM | POA: Diagnosis present

## 2023-02-25 DIAGNOSIS — R293 Abnormal posture: Secondary | ICD-10-CM

## 2023-02-25 DIAGNOSIS — R252 Cramp and spasm: Secondary | ICD-10-CM | POA: Insufficient documentation

## 2023-02-25 DIAGNOSIS — M6281 Muscle weakness (generalized): Secondary | ICD-10-CM | POA: Diagnosis present

## 2023-02-25 DIAGNOSIS — G8929 Other chronic pain: Secondary | ICD-10-CM | POA: Insufficient documentation

## 2023-02-25 DIAGNOSIS — R262 Difficulty in walking, not elsewhere classified: Secondary | ICD-10-CM | POA: Diagnosis present

## 2023-02-25 NOTE — Therapy (Addendum)
OUTPATIENT PHYSICAL THERAPY LOWER EXTREMITY TREATMENT NOTE PHYSICAL THERAPY DISCHARGE SUMMARY  Visits from Start of Care: 15  Current functional level related to goals / functional outcomes: See below   Remaining deficits: See below   Education / Equipment: See below   Patient agrees to discharge. Patient goals were not met. Patient is being discharged due to a change in medical status.     Patient Name: Tricia Harrison MRN: [id number] DOB:[birth year], 77 y.o., female Today's Date: 02/25/2023  END OF SESSION:  PT End of Session - 02/25/23 0959     Visit Number 15    Number of Visits 26    Date for PT Re-Evaluation 03/07/23    Authorization Type [employer] MEDICARE HMO/PPO    PT Start Time 0930    PT Stop Time 1008    PT Time Calculation (min) 38 min    Activity Tolerance Patient limited by pain    Behavior During Therapy WFL for tasks assessed/performed                Past Medical History:  Diagnosis Date   Anemia    as a child   Asthma    as a child   Chronic back pain    spinal stenosis and buldging disc;scoliosis   Chronic constipation    occasionally takes something OTC   Chronic kidney disease    Diabetes mellitus without complication    per pt "Pre" for 20+yrs   Diverticulosis    DVT (deep venous thrombosis)    Gallstones    has known about this for 3-34yrs   GERD (gastroesophageal reflux disease)    takes Omeprazole daily   H/O hiatal hernia    Heart murmur    Hemorrhoids    History of blood transfusion    no abnormal reaction noted   History of bronchitis    states its been a long time ago   History of gout    HLD (hyperlipidemia)    takes Fenofibrate daily   HTN (hypertension)    takes Diltiazem and Ramipril daily   Hypothyroidism    takes Synthroid daily   Left knee DJD    Nocturia    Palpitations    started in 2013 and only occasionally;last time noticed about 2-3wks ago and after drinking caffeine   PONV (postoperative nausea  and vomiting)    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   Past Surgical History:  Procedure Laterality Date   3-day EVENT MONITOR  01/2019   Mostly normal monitor.  Predominantly sinus rhythm (rate range 48-105 bpm, average 67 bpm) 9 short atrial runs (fastest = 13 beats rate 135 bpm, longest ~17 seconds-101 bpm) -> not noted on symptom log.  No sustained arrhythmias (tachycardia or bradycardia), or pauses..  Symptoms are noted with PVCs.   CHOLECYSTECTOMY N/A 01/10/2022   Procedure: LAPAROSCOPIC CHOLECYSTECTOMY WITH INTRAOPERATIVE CHOLANGIOGRAM;  Surgeon: Michael Boston, MD;  Location: [facility];  Service: General;  Laterality: N/A;   COLONOSCOPY     CT ANGIOGRAM OF CHEST - PE PROTOCOL  07/2018   Bilateral nonocclusive pulmonary emboli evidence of right heart strain consistent with "submassive "/intermediate PE.  -->  Code PE called.   DILATION AND CURETTAGE OF UTERUS     multiple about 6-7 times   ESOPHAGOGASTRODUODENOSCOPY     NM VQ LUNG SCAN ([facility] HX)  11/23/2018  Low probability for PE   RIGHT/LEFT HEART CATH AND CORONARY ANGIOGRAPHY N/A 08/19/2018   Procedure: RIGHT/LEFT HEART CATH AND CORONARY ANGIOGRAPHY;  Surgeon: Jettie Booze, MD;  Location: [facility];  Service: Cardiovascular:: Nonobstructive CAD.  Mild pulmonary pretension.  Mean PA pressure 26 mmHg with PA P 48/20 mmHg.  Normal LVEDP.   THYROID SURGERY Right 2010   TOTAL HIP ARTHROPLASTY Left 2005   TOTAL KNEE ARTHROPLASTY Left 02/21/2014   Procedure: TOTAL KNEE ARTHROPLASTY;  Surgeon: Lorn Junes, MD;  Location: [facility];  Service: Orthopedics;  Laterality: Left;   TRANSTHORACIC ECHOCARDIOGRAM  07/2018    (In setting of acute PE) mild LVH.  EF 55 to 60%.  GR 1 DD.  Aortic sclerosis but no stenosis.  Mild regurgitation.  Mild RA dilation.  Moderate LA dilation.  Moderate tricuspid regurgitation with  severe primary hypertension estimated PA pressure 71 mmHg.  RV poorly visualized   TRANSTHORACIC ECHOCARDIOGRAM  10/2018 - 11/2019   a) EF 60-65%.  GR 1 DD.  Focal basal LVH. Mod AI/ no AS. Mild LA dilation.  Normal RV size and fxn.  Mod PI w/ mild TR.  Peak PAP ~ 38 mmHg; b) Normal EF 60 to 65%.  Moderate LVH-GR 1 DD.  R WMA.  Mild AS w/ trivial AI.  Grossly normal PA, RV and RA size.  Normal RV function.  Normal PA pressures.   TRANSTHORACIC ECHOCARDIOGRAM  01/2021    EF 55%.  Normal LV function.  Mild LVH.  GR 1 DD.  Normal PA pressures.  Severe LA dilation.  (Consistent with more significant diastolic dysfunction).  Mild-possibly mild to moderate aortic insufficiency.  Borderline elevated RAP.  Trivial pericardial effusion.  (Stable)   VAGINAL HYSTERECTOMY  1979   Patient Active Problem List   Diagnosis Date Noted   Uses wheelchair 01/14/2022   Velopharyngeal dysfunction with mild swallowing difficulty 01/14/2022   Uses roller walker 01/11/2022   Atherosclerosis of aorta 01/11/2022   Chronic kidney disease, stage 3b 01/11/2022   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   Orthostasis 01/11/2022   Leukocytosis 01/11/2022   Chronic cholecystitis 01/10/2022   Obstructive sleep apnea 03/09/2019   Aortic regurgitation 12/02/2018   History of pulmonary embolism 08/20/2018   DOE (dyspnea on exertion)    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   Bilateral ovarian cysts    Hx of gallstones    Hernia, hiatal    Renal cysts, acquired, bilateral    GERD (gastroesophageal reflux disease)    Diverticulosis    Lumbar spinal stenosis    Diabetes mellitus without complication ([place])    Left knee DJD    Rapid palpitations 12/06/2013   Morbid obesity ([place]) 12/06/2013   Fatigue 12/06/2013   PVC (premature ventricular contraction) 12/06/2013   Essential hypertension    Hypothyroidism    Hyperlipidemia LDL goal <100      REFERRING PROVIDER: Koirala, Dibas, MD  REFERRING DIAG: L  knee TKA/ R knee OA  THERAPY DIAG:  Acute pain of right shoulder  Muscle weakness (generalized)  Cramp and spasm  Difficulty in walking, not elsewhere classified  Abnormal posture  Chronic left shoulder pain  Rationale for Evaluation and Treatment: Rehabilitation  ONSET DATE: Ongoing for multiple years.   SUBJECTIVE:   SUBJECTIVE STATEMENT: Patient states that she is continuing to have shoulder pain and is having increasing difficulty using her walker because of her shoulder pain.  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  PAIN:  Are you having pain? Yes: NPRS scale: 8 L knee/10 R knee/10 Pain location: R and L knee.  Pain description: Swelling, stabbing pain Aggravating factors: Standing, turning R knee.  Relieving factors: Rest, [place], heat.  PRECAUTIONS: Fall, Monitor stroke symptoms.   WEIGHT BEARING RESTRICTIONS:  W/C bound due to pain and weakness.   FALLS:  Has patient fallen in last 6 months? Yes. Number of falls 3 in 24 hours. 6+ pt is unable to recall all.   LIVING ENVIRONMENT: Lives with:  Lives with her sister, but son lives a few houses down.  Lives in: House/apartment Stairs: Yes: Internal: 12 steps; on right going up and External: 1 steps; bilateral but cannot reach both Has following equipment at home: Single point cane, Quad cane large base, Walker - 2 wheeled, Walker - 4 wheeled, Shower bench, bed side commode, and Pt has a scooter.   OCCUPATION: Retired  PLOF: Independent  PATIENT GOALS: Pt would like to be able to walk without falling again.   NEXT MD VISIT:   OBJECTIVE:   DIAGNOSTIC FINDINGS: MRI brain (without) demonstrating: - Mild chronic small vessel ischemic disease. - No acute findings.  PATIENT SURVEYS:  FOTO Not taken today due to time restraint.   COGNITION: Overall cognitive status: Within functional limits for tasks assessed   SENSATION: Pt reports feeling her  blood moving and her skin crawling upon waking.   EDEMA:  Circumferential: L:20", R:19.5"; R calf R: 16", L:17".    POSTURE:  Abnormal posture noted throughout body.   PALPATION: Pt reports pain in her L calf with palpation.   FOTO: 18.42%, 36% predicted   LOWER EXTREMITY ROM:  Active ROM Right eval Left eval 01/17/2023 R/L  Hip flexion 50%  25%  70%/50%  Knee flexion 110 100 105/118  Knee extension -30 P:-10 -32/ P:-25 -28/-29  Ankle dorsiflexion 50%  50% 75%/75%   Ankle plantarflexion [facility] WFL WFL   (Blank rows = not tested)  Pt was seated in a Rolator walker.   LOWER EXTREMITY MMT:  MMT Right eval Left eval 01/17/2023 R/L  Hip flexion 4- 3 4/4-  Knee flexion 4- 3+ 4+/4+  Knee extension 4- 3+ 4+/4   (Blank rows = not tested)  Pt demonstrates decreased functional ROM of her L UE due to weakness and pain. She has a tendency to move in a synergetic pattern.   FUNCTIONAL TESTS:  5 times sit to stand: 42.57 sec   GAIT: Distance walked: Pt walks with multiple deficits with her gait. L LE foot drag, stooped posture, decreased knee extension, slow shuffled gait, ect.  Assistive device utilized: Environmental consultant - 2 wheeled Level of assistance: CGA and with WC following behind due to reports of knee's giving out and falling.  Comments: Pt reports fatigue.    TODAY'S TREATMENT:  02/25/2023:  Nustep initiated: patient having pain in right shoulder Dtr in law was on phone with Dr. Luanna Cole office  PT spoke with surgery scheduler and discussed need to move forward with surgery as patient was not able to do any effective therapy due to the right shoulder pain.  Discussed DC plan with patient and dtr in law:  we are going to discontinue therapy due to right shoulder pain as patient is unable to do any effective therapy for her LE's due to this and she is losing function.    02/17/2023:  Nustep 15 minutes while discussing surgery benefits and risks with secondary weakness  in LE's.   Pulleys for 3 min to tolerance  Seated LAQ  Seated hip adduction Discussed trying aquatic therapy prior to surgery to increase strength and mobility.  02/12/23: Discussed MD visit with patient and sister, patient states MD wants to do surgery and does not want her to do PT but insurance dictates that she do 6 weeks of PT first.   Pulleys for AAROM: flex, scaption x 2 min each Call placed to son and dtr in law to clarify information from MD f/u Discussed with patient the concern over the recovery time for her shoulder interfering with her ability to ambulate and if she would be able to safely ambulate once she had acceptable right shoulder strength following surgery.  Educated patient on precautions and restrictions typical for post TSA.  Suggested patient be extremely diligent with her propping for extension and ALL LE exercises provided in HEP to gain as much strength as possible prior to surgery if she and family decide this is the route they will take.  Warned patient about prolonged standing and how her end stage OA in right knee if aggrevated and painful, can cause instability moments like she experienced yesterday and could result in falling.    02/05/23: Discussed all of the multiple orthopedic issues patient is faced with currently with son,  discussed her vertigo still present. Pulleys seated in wheelchair x 5 min flexion only Seated toe and heel raises x 20 both 0# Seated LAQ x 20 each both 0# March x 20 both 0# Patient had to leave early for MD appt  01/22/23: Nustep x 10 min level 4 (PT present to discuss status and progress toward goals) Gait training with rw 85 feet x 2 (second distance was 100 feet) Seated marching x 20 with 3 lbs LAQ 2 x10 with 3 lbs Standing TKE x 10 each standing holding on to walker wit green tband   PATIENT EDUCATION:  Education details: Educated pt on fall prevention and importance of setting up safe environment at home including using 3 in 1 bedside  at night. Reviewed HEP with patient and son. Person educated: Patient and son Education method: Explanation and Demonstration Education comprehension: verbalized understanding, returned demonstration, and needs further education  HOME EXERCISE PROGRAM: Access Code: [email or web address] URL: [email or web address] Date: 12/25/2022 Prepared by: Candyce Churn  Exercises - Seated Long Arc Quad  - 1 x daily - 7 x weekly - 2 sets - 10 reps - Seated March  - 1 x daily - 7 x weekly - 2 sets - 10 reps - Seated Hip Adduction Squeeze with Ball  - 1 x daily - 7 x weekly - 2 sets - 10 reps - 5 hold - Seated Hip Abduction with Resistance  - 1 x daily - 7 x weekly - 3 sets - 10 reps - Sit to Stand with Counter Support  - 1 x daily - 7 x weekly - 2 sets - 5 reps - Seated Knee Extension Stretch with Chair  - 1 x daily - 7 x weekly - 3 sets - 10 reps   ASSESSMENT:  CLINICAL IMPRESSION: Mrs. Kostyk is losing function.  She is having increasing difficulty with mobility due to the right shoulder pain.  We are unable to do any effective physical therapy at this time due to her right shoulder pain.  We are going to discontinue PT at this time and refer back to ortho MD to discuss moving forward with right shoulder surgery.    OBJECTIVE IMPAIRMENTS:  decreased activity tolerance, difficulty walking, decreased balance, decreased endurance, decreased mobility, decreased ROM, decreased strength, impaired flexibility, impaired UE/LE use, postural dysfunction, and pain.  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  are also affecting patient's functional outcome.  REHAB POTENTIAL: Fair due to complex medical hx.   CLINICAL DECISION MAKING: Unstable/unpredictable  EVALUATION COMPLEXITY: Low   GOALS: Short term PT Goals Target date:  01/31/23 Pt will be I and compliant with HEP. Baseline:  Goal status: New Pt will decrease pain by 25% overall Baseline: Goal status: New  Long term PT goals Target date: 04/01/23 Pt will improve ROM to [facility] to improve functional mobility in LE and UE.  Baseline: Goal status: New Pt will improve L hip/knee strength to at least 4/5 MMT to improve functional strength Baseline: Goal status: New Pt will improve FOTO to at least 36% functional to show improved function Baseline: Goal status: NOT MET 01/17/2023, 18.4246% Pt will reduce pain by overall 50% overall with usual activity Baseline: Goal status: MET 01/17/2023 Pt will improve her STS by 2.3 seconds for Minimal clinically important difference.          Baseline:  42.57 sec               Goal status: MET 01/17/2023, 29.60 seconds Pt will be able to ambulate community distances at least 1000 ft WNL gait pattern without complaints Baseline: Goal status: New        7. Pt will improve TUG by 3.4 seconds or Minimal clinically important difference.         Baseline:         Goal status: 27.06 seconds  8. Pt will be able to walk room to room with a SPC, without complaints         Baseline:        Goal status: NEW 9. Pt would like to improve her balance to be able to lift one leg off the floor for >5 seconds.                    Baseline:                     Goal status: NEW  10. Pt will report a reduction in vertigo symptoms                   Baseline:        Goal status: NEW  PLAN: PT FREQUENCY: 2 times per week   PT DURATION:  8 weeks  PLANNED INTERVENTIONS (unless contraindicated): aquatic PT, Canalith repositioning, cryotherapy, Electrical stimulation, Iontophoresis with 4 mg/ml dexamethasome, Moist heat, traction, Ultrasound, gait training, Therapeutic exercise, balance training, neuromuscular re-education, patient/family education, prosthetic training, manual techniques, passive ROM, dry needling, taping, vasopnuematic device,  vestibular, spinal manipulations, joint manipulations  PLAN FOR NEXT SESSION: DC PT due to lack of progress due to right shoulder pain. Patient to have f/u with ortho MD to discuss moving forward with right shoulder surgery.     Anderson Malta B. Dallon Dacosta, PT 02/25/23 10:09 AM [facility] [address], [place], [place] [address] Phone # [phone] Fax [phone]

## 2023-03-03 ENCOUNTER — Ambulatory Visit: Payer: Medicare HMO | Admitting: Physical Therapy

## 2023-03-05 ENCOUNTER — Ambulatory Visit: Payer: Medicare HMO

## 2023-03-12 NOTE — Progress Notes (Unsigned)
Cardiology Clinic Note   Patient Name: Tricia Harrison Date of Encounter: 03/12/2023  Primary Care Provider:  Darrow Bussing, MD Primary Cardiologist:  Bryan Lemma, MD  Patient Profile    Tricia Harrison 77 year old female presents to the clinic today for follow-up evaluation of her essential hypertension PVCs and DOE.  Past Medical History    Past Medical History:  Diagnosis Date   Anemia    as a child   Asthma    as a child   Chronic back pain    spinal stenosis and buldging disc;scoliosis   Chronic constipation    occasionally takes something OTC   Chronic kidney disease    Diabetes mellitus without complication    per pt "Pre" for 20+yrs   Diverticulosis    DVT (deep venous thrombosis)    Gallstones    has known about this for 3-28yrs   GERD (gastroesophageal reflux disease)    takes Omeprazole daily   H/O hiatal hernia    Heart murmur    Hemorrhoids    History of blood transfusion    no abnormal reaction noted   History of bronchitis    states its been a long time ago   History of gout    HLD (hyperlipidemia)    takes Fenofibrate daily   HTN (hypertension)    takes Diltiazem and Ramipril daily   Hypothyroidism    takes Synthroid daily   Left knee DJD    Nocturia    Palpitations    started in 2013 and only occasionally;last time noticed about 2-3wks ago and after drinking caffeine   PONV (postoperative nausea and vomiting)    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   Past Surgical History:  Procedure Laterality Date   3-day EVENT MONITOR  01/2019   Mostly normal monitor.  Predominantly sinus rhythm (rate range 48-105 bpm, average 67 bpm) 9 short atrial runs (fastest = 13 beats rate 135 bpm, longest ~17 seconds-101 bpm) -> not noted on symptom log.  No sustained arrhythmias (tachycardia or bradycardia), or pauses..  Symptoms  are noted with PVCs.   CHOLECYSTECTOMY N/A 01/10/2022   Procedure: LAPAROSCOPIC CHOLECYSTECTOMY WITH INTRAOPERATIVE CHOLANGIOGRAM;  Surgeon: Karie Soda, MD;  Location: [facility];  Service: General;  Laterality: N/A;   COLONOSCOPY     CT ANGIOGRAM OF CHEST - PE PROTOCOL  07/2018   Bilateral nonocclusive pulmonary emboli evidence of right heart strain consistent with "submassive "/intermediate PE.  -->  Code PE called.   DILATION AND CURETTAGE OF UTERUS     multiple about 6-7 times   ESOPHAGOGASTRODUODENOSCOPY     NM VQ LUNG SCAN ([facility] HX)  11/23/2018   Low probability for PE   RIGHT/LEFT HEART CATH AND CORONARY ANGIOGRAPHY N/A 08/19/2018   Procedure: RIGHT/LEFT HEART CATH AND CORONARY ANGIOGRAPHY;  Surgeon: Corky Crafts, MD;  Location: [facility] INVASIVE CV LAB;  Service: Cardiovascular:: Nonobstructive CAD.  Mild pulmonary pretension.  Mean PA pressure 26 mmHg with PA P 48/20 mmHg.  Normal LVEDP.   THYROID SURGERY Right 2010   TOTAL HIP ARTHROPLASTY Left 2005   TOTAL KNEE ARTHROPLASTY Left 02/21/2014   Procedure: TOTAL KNEE ARTHROPLASTY;  Surgeon: Nilda Simmer, MD;  Location: [facility];  Service: Orthopedics;  Laterality: Left;   TRANSTHORACIC ECHOCARDIOGRAM  07/2018    (In setting of acute PE) mild LVH.  EF  55 to 60%.  GR 1 DD.  Aortic sclerosis but no stenosis.  Mild regurgitation.  Mild RA dilation.  Moderate LA dilation.  Moderate tricuspid regurgitation with severe primary hypertension estimated PA pressure 71 mmHg.  RV poorly visualized   TRANSTHORACIC ECHOCARDIOGRAM  10/2018 - 11/2019   a) EF 60-65%.  GR 1 DD.  Focal basal LVH. Mod AI/ no AS. Mild LA dilation.  Normal RV size and fxn.  Mod PI w/ mild TR.  Peak PAP ~ 38 mmHg; b) Normal EF 60 to 65%.  Moderate LVH-GR 1 DD.  R WMA.  Mild AS w/ trivial AI.  Grossly normal PA, RV and RA size.  Normal RV function.  Normal PA pressures.   TRANSTHORACIC ECHOCARDIOGRAM  01/2021    EF 55%.  Normal LV function.  Mild LVH.  GR 1 DD.  Normal PA  pressures.  Severe LA dilation.  (Consistent with more significant diastolic dysfunction).  Mild-possibly mild to moderate aortic insufficiency.  Borderline elevated RAP.  Trivial pericardial effusion.  (Stable)   VAGINAL HYSTERECTOMY  1979    Allergies  Allergies  Allergen Reactions   Atorvastatin Other (See Comments)    Muscle pain   Codeine Nausea And Vomiting   Morphine And Related     B/p drops     History of Present Illness    Tricia Harrison has a PMH of submassive PE, bilateral lower extremity edema, and essential hypertension.  Her PMH also includes progressive dyspnea and palpitations.  She was connected with via telephone 03/23/2021.  During that time she reported that she was doing well.  She was happy about her recent weight loss.  She had been discharged from physical therapy and was trying to continue the exercises on her own at home.  She was working on maintaining a more strict diet.  Her dyspnea has been improving.  It was noted that she was still quite deconditioned.  She noticed her palpitations on and off with exertion.  However, these were not overly concerning to her.  It was felt that her dyspnea was related to her body habitus.  She denied chest pain, edema, PND, increased heart rate, presyncope and syncope.  She also denied neurological sequela and lower extremity claudication.  She was asked to continue to monitor her blood pressures.  She presented to the clinic 11/09/21 for follow-up evaluation stated she had noticed that over the past few months she had increased work of breathing with increased physical activity.  She presented with her son.  She reported that she was sedentary due to her left knee and back pain.  She was able to do all of her ADLs but had no formal exercise routine.  We reviewed the importance of increasing physical activity.  She noted occasional swelling in her lower extremities after long periods of sitting or periods of travel.  Her blood  pressure was elevated initially at 152/78 and on recheck 144/74.  I increased her ramipril to 5 mg daily, ordered a BMP in 1 week, and planned follow-up in 1 to 2 months.  She was seen in follow-up by Dr. Herbie Baltimore on 04/16/2022.  She was noted to be very deconditioned.  She was walking with a walker.  Her blood pressure was noted to be high.  She reported that it had been labile at home.  She was continued on low-dose diltiazem and carvedilol.  She was continued on clonidine to be used for systolic pressures greater than 180 mmHg.  Recommended  hydration, lower extremity support stockings and elevating feet for orthostasis.  She presents to the clinic today for follow-up evaluation and states***.  Today she denies chest pain, lower extremity edema, fatigue, palpitations, melena, hematuria, hemoptysis, diaphoresis, weakness, presyncope, syncope, orthopnea, and PND.    Essential hypertension-BP today 144/74***.  Well-controlled at home. Continue carvedilol, diltiazem, ramipril Heart healthy low-sodium diet-salty 6 reviewed Increase physical activity as tolerated-chair exercises given Maintain blood pressure log   Hyperlipidemia-LDL 81 11/15/20*** Continue fenofibrate, rosuvastatin Heart healthy low-sodium high-fiber diet.   Increase physical activity as tolerated Repeat fasting lipids and LFTs  Orthostasis-noted to have orthostatic hypotension in the hospital postoperatively.  Was felt to be due to dehydration.  Denies further episodes. Maintain p.o. hydration, lower extremity support stockings, change positions slowly Continue to monitor  PVCs-continues with occasionally  extra heartbeats.  PVCs noted on prior cardiac event monitor. Avoid triggers caffeine, chocolate, EtOH, dehydration etc. Continue carvedilol  Aortic regurgitation-stable physical activity and no changes with respiration.  Dyspnea appears to be related to body habitus and lack of fitness.  Echocardiogram 02/20/2021 showed  mild calcification and thickening of the aortic valve with mild aortic valve regurgitation but no stenosis. Plan for repeat echocardiogram when clinically indicated Increase physical activity as tolerated-chair exercises given  Morbid obesity-weight [id number]***.  Weight management discussed.  Has been continuing strict diet and increase physical activity. Continue weight loss  History of pulmonary embolism- compliant with apixaban and denies bleeding issues.  Will be on lifelong apixaban.  Previously indicated that her medication may be held prior to procedures. Continue apixaban  Disposition: Follow-up with Dr. Herbie Baltimore in 9-12 months.  Home Medications    Prior to Admission medications   Medication Sig Start Date End Date Taking? Authorizing Provider  acetaminophen (TYLENOL) 500 MG tablet Take 1,000 mg by mouth every 6 (six) hours as needed for mild pain.    [provider]  albuterol (VENTOLIN HFA) 108 (90 Base) MCG/ACT inhaler Inhale 2 puffs into the lungs every 6 (six) hours as needed. 07/02/19   Coral Ceo, NP  apixaban (ELIQUIS) 5 MG TABS tablet TAKE 1 TABLET BY MOUTH  TWICE DAILY 09/21/21   Marykay Lex, MD  azaTHIOprine (IMURAN) 50 MG tablet Take 100 mg by mouth daily. 08/08/20   [provider]  bimatoprost (LUMIGAN) 0.01 % SOLN Place 1 drop into both eyes at bedtime. 02/06/21   [provider]  carvedilol (COREG) 6.25 MG tablet TAKE 1 TABLET BY MOUTH TWO  TIMES DAILY WITH A MEAL 04/23/19   Marykay Lex, MD  Cholecalciferol (VITAMIN D3) 2000 UNITS TABS Take 2,000 Units by mouth daily.    [provider]  COVID-19 mRNA vaccine, Moderna, 100 MCG/0.5ML injection Inject into the muscle. 08/30/21   Judyann Munson, MD  diltiazem (CARDIZEM LA) 360 MG 24 hr tablet Take 360 mg by mouth daily.    [provider]  dorzolamide-timolol (COSOPT) 22.3-6.8 MG/ML ophthalmic solution Place 1 drop into both eyes 2 times daily. 11/07/20   [provider]  fenofibrate (TRICOR) 145 MG tablet Take 145 mg by mouth daily.    [provider]  HUMIRA PEN 40 MG/0.4ML PNKT SMARTSIG:40 Milligram(s) SUB-Q Every 2 Weeks 01/22/21   [provider]  ketorolac (ACULAR) 0.4 % SOLN Apply 1 drop to eye 4 (four) times daily. 11/07/20   [provider]  levothyroxine (SYNTHROID, LEVOTHROID) 50 MCG tablet Take 50 mcg by mouth daily before breakfast.    [provider]  omeprazole (  PRILOSEC) 20 MG capsule Take 20 mg by mouth daily.    [provider]  polyethylene glycol (MIRALAX / GLYCOLAX) 17 g packet Take 17 g by mouth daily as needed for mild constipation.    [provider]  ramipril (ALTACE) 2.5 MG capsule Take 2.5 mg by mouth daily. 11/08/20   [provider]  rosuvastatin (CRESTOR) 20 MG tablet TAKE 1 TABLET BY MOUTH  DAILY AT 6 PM. 05/25/21   Marykay Lex, MD    Family History    Family History  Problem Relation Age of Onset   Uterine cancer Mother    Heart disease Mother    Hypertension Mother    Diabetes Mother    Cirrhosis Father    Pneumonia Father    Lymphoma Sister        ,non hodgkin    Diabetes Sister    Breast cancer Neg Hx    She indicated that her mother is deceased. She indicated that her father is deceased. She indicated that her sister is alive. She indicated that the status of her neg hx is unknown.  Social History    Social History   Socioeconomic History   Marital status: Single    Spouse name: Not on file   Number of children: Not on file   Years of education: Not on file   Highest education level: Not on file  Occupational History   Occupation: Retired  Tobacco Use   Smoking status: Former    Packs/day: 0.50    Years: 20.00    Additional pack years: 0.00    Total pack years: 10.00    Types: Cigarettes    Start date: 09/04/1964    Quit date: 11/25/1993    Years since quitting: 29.3   Smokeless tobacco: Never   Tobacco comments:     quit smoking in 1995  Vaping Use   Vaping Use: Never used  Substance and Sexual Activity   Alcohol use: Yes    Comment: Socially.   Drug use: No   Sexual activity: Not on file  Other Topics Concern   Not on file  Social History Narrative   Pt lives w/ sister.   Social Determinants of Health   Financial Resource Strain: Not on file  Food Insecurity: Not on file  Transportation Needs: Not on file  Physical Activity: Not on file  Stress: Not on file  Social Connections: Not on file  Intimate Partner Violence: Not on file     Review of Systems    General:  No chills, fever, night sweats or weight changes.  Cardiovascular:  No chest pain, dyspnea on exertion, edema, orthopnea, palpitations, paroxysmal nocturnal dyspnea. Dermatological: No rash, lesions/masses Respiratory: No cough, dyspnea Urologic: No hematuria, dysuria Abdominal:   No nausea, vomiting, diarrhea, bright red blood per rectum, melena, or hematemesis Neurologic:  No visual changes, wkns, changes in mental status. All other systems reviewed and are otherwise negative except as noted above.  Physical Exam    VS:  There were no vitals taken for this visit. , BMI There is no height or weight on file to calculate BMI. GEN: Well nourished, well developed, in no acute distress. HEENT: normal. Neck: Supple, no JVD, carotid bruits, or masses. Cardiac: RRR, no murmurs, rubs, or gallops. No clubbing, cyanosis, edema.  Radials/DP/PT 2+ and equal bilaterally.  Respiratory:  Respirations regular and unlabored, clear to auscultation bilaterally. GI: Soft, nontender, nondistended, BS + x 4. MS: no deformity or atrophy. Skin: warm  and dry, no rash. Neuro:  Strength and sensation are intact. Psych: Normal affect.  Accessory Clinical Findings    Recent Labs: No results found for requested labs within last 365 days.   Recent Lipid Panel    Component Value Date/Time   CHOL 190 10/08/2018 0929   TRIG 71 10/08/2018 0929    HDL 84 10/08/2018 0929   CHOLHDL 2.3 10/08/2018 0929   CHOLHDL 3.1 08/19/2018 0638   VLDL 17 08/19/2018 0638   LDLCALC 92 10/08/2018 0929    ECG personally reviewed by me today-none today.  Echocardiogram 02/20/2021  IMPRESSIONS     1. Left ventricular ejection fraction, by estimation, is 55%. The left  ventricle has normal function. The left ventricle has no regional wall  motion abnormalities. There is mild left ventricular hypertrophy. Left  ventricular diastolic parameters are  consistent with Grade I diastolic dysfunction (impaired relaxation).   2. Right ventricular systolic function is normal. The right ventricular  size is mildly enlarged. There is normal pulmonary artery systolic  pressure.   3. Left atrial size was severely dilated.   4. The mitral valve is grossly normal. Trivial mitral valve  regurgitation. No evidence of mitral stenosis.   5. The aortic valve is grossly normal. There is mild calcification and  thickening of the aortic valve. Aortic valve regurgitation is mild, but  may be slightly underestimated, jet appears eccentric. No aortic stenosis  is present.   6. The inferior vena cava is normal in size with <50% respiratory  variability, suggesting right atrial pressure of 8 mmHg.   7. Trivial pericardial effusion is circumferential.  Assessment & Plan   1.***  Thomasene Ripple. Garland Smouse NP-C    03/12/2023, 7:00 AM [facility] [address] Office [phone] Fax [phone]  Notice: This dictation was prepared with Dragon dictation along with smaller phrase technology. Any transcriptional errors that result from this process are unintentional and may not be corrected upon review.  I spent 14***minutes examining this patient, reviewing medications, and using patient centered shared decision making involving her cardiac care.  Prior to her visit I spent greater than 20 minutes reviewing her past medical history,   medications, and prior cardiac tests.

## 2023-03-13 ENCOUNTER — Encounter: Payer: Self-pay | Admitting: General Practice

## 2023-03-13 ENCOUNTER — Ambulatory Visit: Payer: Medicare HMO | Attending: General Practice | Admitting: General Practice

## 2023-03-13 VITALS — BP 119/68 | HR 69 | Ht 61.0 in | Wt 162.8 lb

## 2023-03-13 DIAGNOSIS — Z86711 Personal history of pulmonary embolism: Secondary | ICD-10-CM

## 2023-03-13 DIAGNOSIS — I951 Orthostatic hypotension: Secondary | ICD-10-CM | POA: Diagnosis not present

## 2023-03-13 DIAGNOSIS — I493 Ventricular premature depolarization: Secondary | ICD-10-CM

## 2023-03-13 DIAGNOSIS — I10 Essential (primary) hypertension: Secondary | ICD-10-CM | POA: Diagnosis not present

## 2023-03-13 DIAGNOSIS — I351 Nonrheumatic aortic (valve) insufficiency: Secondary | ICD-10-CM

## 2023-03-13 DIAGNOSIS — E785 Hyperlipidemia, unspecified: Secondary | ICD-10-CM | POA: Diagnosis not present

## 2023-03-13 MED ORDER — VALSARTAN 80 MG PO TABS
80.0000 mg | ORAL_TABLET | Freq: Every day | ORAL | Status: DC
Start: 2023-03-13 — End: 2023-07-16

## 2023-03-13 NOTE — Patient Instructions (Addendum)
Medication Instructions:  The current medical regimen is effective;  continue present plan and medications as directed. Please refer to the Current Medication list given to you today.  *If you need a refill on your cardiac medications before your next appointment, please call your pharmacy*   Lab Work: FASTING LIPID AND LFT, THE DAY YOU HAVE YOUR ECHO DONE If you have labs (blood work) drawn today and your tests are completely normal, you will receive your results only by: MyChart Message (if you have MyChart) OR A paper copy in the mail If you have any lab test that is abnormal or we need to change your treatment, we will call you to review the results.  Testing/Procedures: Echocardiogram - Your physician has requested that you have an echocardiogram. Echocardiography is a painless test that uses sound waves to create images of your heart. It provides your doctor with information about the size and shape of your heart and how well your heart's chambers and valves are working. This procedure takes approximately one hour. There are no restrictions for this procedure.   Other Instructions TAKE AND LOG YOUR BLOOD PRESSURE AND WEIGHT  Follow-Up: At [facility], you and your health needs are our priority.  As part of our continuing mission to provide you with exceptional heart care, we have created designated Provider Care Teams.  These Care Teams include your primary Cardiologist (physician) and Advanced Practice Providers (APPs -  Physician Assistants and Nurse Practitioners) who all work together to provide you with the care you need, when you need it.  We recommend signing up for the patient portal called "MyChart".  Sign up information is provided on this After Visit Summary.  MyChart is used to connect with patients for Virtual Visits (Telemedicine).  Patients are able to view lab/test results, encounter notes, upcoming appointments, etc.  Non-urgent messages can be sent to your  provider as well.   To learn more about what you can do with MyChart, go to [email or web address].    Your next appointment:   9-12 month(s)  Provider:   Bryan Lemma, MD

## 2023-03-27 ENCOUNTER — Other Ambulatory Visit: Payer: Self-pay | Admitting: Family Medicine

## 2023-03-27 DIAGNOSIS — Z1231 Encounter for screening mammogram for malignant neoplasm of breast: Secondary | ICD-10-CM

## 2023-04-01 ENCOUNTER — Other Ambulatory Visit: Payer: Self-pay | Admitting: Cardiology

## 2023-04-01 NOTE — Telephone Encounter (Signed)
Prescription refill request for Eliquis received. Indication: PE Last office visit: 03/13/23  Sherlean Foot NP Scr: 1.31 on 01/14/22 Age: 77 Weight: 73.8kg  Based on above findings Eliquis 5mg  twice daily is the appropriate dose.  Refill approved.

## 2023-04-14 ENCOUNTER — Ambulatory Visit: Payer: Medicare HMO

## 2023-04-14 ENCOUNTER — Ambulatory Visit (HOSPITAL_COMMUNITY): Payer: Medicare HMO | Attending: General Practice

## 2023-04-14 DIAGNOSIS — I351 Nonrheumatic aortic (valve) insufficiency: Secondary | ICD-10-CM

## 2023-04-14 LAB — ECHOCARDIOGRAM COMPLETE
Area-P 1/2: 3.24 cm2
P 1/2 time: 508 msec
S' Lateral: 3.1 cm

## 2023-04-15 ENCOUNTER — Other Ambulatory Visit (HOSPITAL_COMMUNITY): Payer: Medicare HMO

## 2023-04-15 LAB — LIPID PANEL
Chol/HDL Ratio: 2.6 ratio (ref 0.0–4.4)
Cholesterol, Total: 143 mg/dL (ref 100–199)
HDL: 54 mg/dL (ref >39)
LDL Chol Calc (NIH): 71 mg/dL (ref 0–99)
Triglycerides: 94 mg/dL (ref 0–149)
VLDL Cholesterol Cal: 18 mg/dL (ref 5–40)

## 2023-04-15 LAB — HEPATIC FUNCTION PANEL
ALT: 11 IU/L (ref 0–32)
AST: 16 IU/L (ref 0–40)
Albumin: 3.7 g/dL — ABNORMAL LOW (ref 3.8–4.8)
Alkaline Phosphatase: 35 IU/L — ABNORMAL LOW (ref 44–121)
Bilirubin Total: 0.4 mg/dL (ref 0.0–1.2)
Bilirubin, Direct: 0.15 mg/dL (ref 0.00–0.40)
Total Protein: 5.9 g/dL — ABNORMAL LOW (ref 6.0–8.5)

## 2023-04-30 ENCOUNTER — Ambulatory Visit: Payer: Medicare HMO

## 2023-06-03 NOTE — Progress Notes (Addendum)
Anesthesia Review:  PCP: Dibas Koirala  Clearance on chart 03/09/23  Cardiologist :  Vallery Sa, NP LOV 03/13/23  Kidney- DR Lynda Rainwater- clearance on chart dated 02/19/23  Neuro- DR Frances Furbish- LOV 08/2022- clearance in epic dated 02/13/23 and on chart.    LVMM and requested clearances from DR Dion Saucier office.   Chest x-ray : EKG : 03/13/23  Echo : 04/14/23  Stress test: Cardiac Cath :  2019  Monitor- 2022  Activity level: cannot do a flight of stairs without difficutly.  Walks very little at home per pt  Sleep Study/ CPAP  has cpap but does not use  Fasting Blood Sugar :      / Checks Blood Sugar -- times a day:   Blood Thinner/ Instructions /Last Dose: ASA / Instructions/ Last Dose :    Eliquis- stop 2 days prior to surgery per pt       DM- type ? - pt denies being a Diabetic at time of preop appt .  PT states that aides at home check glucose but is unsure of how often. On no meds.   Hgba1c- 06/04/23-  5.5    PT has family member with her at time of preop appt .  PT is in wheelchair.  Alert and oriented.  PT has aides at home 5 days per week 4 hours per day.  PT states after surgery that she will have 2 aides per day.  Agency- [employer].  PT unable to bath and dress herself.  Pt also has incontinence.    CBC done 06/04/23 with hgb of 9.9 routed to DR Landau.   Shanda Bumps [facility] aware.

## 2023-06-03 NOTE — Patient Instructions (Signed)
SURGICAL WAITING ROOM VISITATION  Patients having surgery or a procedure may have no more than 2 support people in the waiting area - these visitors may rotate.    Children under the age of 12 must have an adult with them who is not the patient.  Due to an increase in RSV and influenza rates and associated hospitalizations, children ages 75 and under may not visit patients in [facility] hospitals.  If the patient needs to stay at the hospital during part of their recovery, the visitor guidelines for inpatient rooms apply. Pre-op nurse will coordinate an appropriate time for 1 support person to accompany patient in pre-op.  This support person may not rotate.    Please refer to the [facility] website for the visitor guidelines for Inpatients (after your surgery is over and you are in a regular room).       Your procedure is scheduled on:  06/17/23    Report to [facility] Main Entrance    Report to admitting at  0515 AM   Call this number if you have problems the morning of surgery [phone]   Do not eat food :After Midnight.   After Midnight you may have the following liquids until _0430_____ AM DAY OF SURGERY   Water Non-Citrus Juices (without pulp, NO RED-Apple, White grape, White cranberry) Black Coffee (NO MILK/CREAM OR CREAMERS, sugar ok)  Clear Tea (NO MILK/CREAM OR CREAMERS, sugar ok) regular and decaf                             Plain Jell-O (NO RED)                                           Fruit ices (not with fruit pulp, NO RED)                                     Popsicles (NO RED)                                                               Sports drinks like Gatorade (NO RED)                    The day of surgery:  Drink ONE (1) Pre-Surgery Clear Ensure or G2 at  0430AM  ( have completed by ) the morning of surgery. Drink in one sitting. Do not sip.  This drink was given to you during your hospital  pre-op appointment visit. Nothing else to drink  after completing the  Pre-Surgery Clear Ensure or G2.          If you have questions, please contact your surgeon's office.      Oral Hygiene is also important to reduce your risk of infection.                                    Remember - BRUSH YOUR TEETH THE MORNING OF SURGERY WITH YOUR REGULAR  TOOTHPASTE  DENTURES WILL BE REMOVED PRIOR TO SURGERY PLEASE DO NOT APPLY "Poly grip" OR ADHESIVES!!!   Do NOT smoke after Midnight   Take these medicines the morning of surgery with A SIP OF WATER:  inhalers as usual and bring, coreg, clonidien if needed, cardizem, synthroid, omeprazole     DO NOT TAKE ANY ORAL DIABETIC MEDICATIONS DAY OF YOUR SURGERY  Bring CPAP mask and tubing day of surgery.                              You may not have any metal on your body including hair pins, jewelry, and body piercing             Do not wear make-up, lotions, powders, perfumes/cologne, or deodorant  Do not wear nail polish including gel and S&S, artificial/acrylic nails, or any other type of covering on natural nails including finger and toenails. If you have artificial nails, gel coating, etc. that needs to be removed by a nail salon please have this removed prior to surgery or surgery may need to be canceled/ delayed if the surgeon/ anesthesia feels like they are unable to be safely monitored.   Do not shave  48 hours prior to surgery.               Men may shave face and neck.   Do not bring valuables to the hospital. [facility] IS NOT             RESPONSIBLE   FOR VALUABLES.   Contacts, glasses, dentures or bridgework may not be worn into surgery.   Bring small overnight bag day of surgery.   DO NOT BRING YOUR HOME MEDICATIONS TO THE HOSPITAL. PHARMACY WILL DISPENSE MEDICATIONS LISTED ON YOUR MEDICATION LIST TO YOU DURING YOUR ADMISSION IN THE HOSPITAL!    Patients discharged on the day of surgery will not be allowed to drive home.  Someone NEEDS to stay with you for the first 24  hours after anesthesia.   Special Instructions: Bring a copy of your healthcare power of attorney and living will documents the day of surgery if you haven't scanned them before.              Please read over the following fact sheets you were given: IF YOU HAVE QUESTIONS ABOUT YOUR PRE-OP INSTRUCTIONS PLEASE CALL [phone]   If you received a COVID test during your pre-op visit  it is requested that you wear a mask when out in public, stay away from anyone that may not be feeling well and notify your surgeon if you develop symptoms. If you test positive for Covid or have been in contact with anyone that has tested positive in the last 10 days please notify you surgeon.      Pre-operative 5 CHG Bath Instructions   You can play a key role in reducing the risk of infection after surgery. Your skin needs to be as free of germs as possible. You can reduce the number of germs on your skin by washing with CHG (chlorhexidine gluconate) soap before surgery. CHG is an antiseptic soap that kills germs and continues to kill germs even after washing.   DO NOT use if you have an allergy to chlorhexidine/CHG or antibacterial soaps. If your skin becomes reddened or irritated, stop using the CHG and notify one of our RNs at [phone].   Please shower with the CHG soap starting 4  days before surgery using the following schedule:     Please keep in mind the following:  DO NOT shave, including legs and underarms, starting the day of your first shower.   You may shave your face at any point before/day of surgery.  Place clean sheets on your bed the day you start using CHG soap. Use a clean washcloth (not used since being washed) for each shower. DO NOT sleep with pets once you start using the CHG.   CHG Shower Instructions:  If you choose to wash your hair and private area, wash first with your normal shampoo/soap.  After you use shampoo/soap, rinse your hair and body thoroughly to remove  shampoo/soap residue.  Turn the water OFF and apply about 3 tablespoons (45 ml) of CHG soap to a CLEAN washcloth.  Apply CHG soap ONLY FROM YOUR NECK DOWN TO YOUR TOES (washing for 3-5 minutes)  DO NOT use CHG soap on face, private areas, open wounds, or sores.  Pay special attention to the area where your surgery is being performed.  If you are having back surgery, having someone wash your back for you may be helpful. Wait 2 minutes after CHG soap is applied, then you may rinse off the CHG soap.  Pat dry with a clean towel  Put on clean clothes/pajamas   If you choose to wear lotion, please use ONLY the CHG-compatible lotions on the back of this paper.     Additional instructions for the day of surgery: DO NOT APPLY any lotions, deodorants, cologne, or perfumes.   Put on clean/comfortable clothes.  Brush your teeth.  Ask your nurse before applying any prescription medications to the skin.      CHG Compatible Lotions   Aveeno Moisturizing lotion  Cetaphil Moisturizing Cream  Cetaphil Moisturizing Lotion  Clairol Herbal Essence Moisturizing Lotion, Dry Skin  Clairol Herbal Essence Moisturizing Lotion, Extra Dry Skin  Clairol Herbal Essence Moisturizing Lotion, Normal Skin  Curel Age Defying Therapeutic Moisturizing Lotion with Alpha Hydroxy  Curel Extreme Care Body Lotion  Curel Soothing Hands Moisturizing Hand Lotion  Curel Therapeutic Moisturizing Cream, Fragrance-Free  Curel Therapeutic Moisturizing Lotion, Fragrance-Free  Curel Therapeutic Moisturizing Lotion, Original Formula  Eucerin Daily Replenishing Lotion  Eucerin Dry Skin Therapy Plus Alpha Hydroxy Crme  Eucerin Dry Skin Therapy Plus Alpha Hydroxy Lotion  Eucerin Original Crme  Eucerin Original Lotion  Eucerin Plus Crme Eucerin Plus Lotion  Eucerin TriLipid Replenishing Lotion  Keri Anti-Bacterial Hand Lotion  Keri Deep Conditioning Original Lotion Dry Skin Formula Softly Scented  Keri Deep Conditioning  Original Lotion, Fragrance Free Sensitive Skin Formula  Keri Lotion Fast Absorbing Fragrance Free Sensitive Skin Formula  Keri Lotion Fast Absorbing Softly Scented Dry Skin Formula  Keri Original Lotion  Keri Skin Renewal Lotion Keri Silky Smooth Lotion  Keri Silky Smooth Sensitive Skin Lotion  Nivea Body Creamy Conditioning Oil  Nivea Body Extra Enriched Lotion  Nivea Body Original Lotion  Nivea Body Sheer Moisturizing Lotion Nivea Crme  Nivea Skin Firming Lotion  NutraDerm 30 Skin Lotion  NutraDerm Skin Lotion  NutraDerm Therapeutic Skin Cream  NutraDerm Therapeutic Skin Lotion  ProShield Protective Hand Cream  Provon moisturizing lotion   [place]- Preparing for Total Shoulder Arthroplasty    Before surgery, you can play an important role. Because skin is not sterile, your skin needs to be as free of germs as possible. You can reduce the number of germs on your skin by using the following products. Benzoyl Peroxide Gel  Reduces the number of germs present on the skin Applied twice a day to shoulder area starting two days before surgery    ==================================================================  Please follow these instructions carefully:  BENZOYL PEROXIDE 5% GEL  Please do not use if you have an allergy to benzoyl peroxide.   If your skin becomes reddened/irritated stop using the benzoyl peroxide.  Starting two days before surgery, apply as follows: Apply benzoyl peroxide in the morning and at night. Apply after taking a shower. If you are not taking a shower clean entire shoulder front, back, and side along with the armpit with a clean wet washcloth.  Place a quarter-sized dollop on your shoulder and rub in thoroughly, making sure to cover the front, back, and side of your shoulder, along with the armpit.   2 days before ____ AM   ____ PM              1 day before ____ AM   ____ PM                         Do this twice a day for two days.  (Last application  is the night before surgery, AFTER using the CHG soap as described below).  Do NOT apply benzoyl peroxide gel on the day of surgery.

## 2023-06-04 ENCOUNTER — Encounter (HOSPITAL_COMMUNITY): Payer: Self-pay

## 2023-06-04 ENCOUNTER — Encounter (HOSPITAL_COMMUNITY)
Admission: RE | Admit: 2023-06-04 | Discharge: 2023-06-04 | Disposition: A | Discharge status: Home / Self Care | Payer: Medicare HMO | Source: Ambulatory Visit | Attending: Orthopedic Surgery | Admitting: Orthopedic Surgery

## 2023-06-04 ENCOUNTER — Other Ambulatory Visit: Payer: Self-pay

## 2023-06-04 VITALS — BP 147/77 | HR 68 | Temp 98.6°F | Resp 16

## 2023-06-04 DIAGNOSIS — M19011 Primary osteoarthritis, right shoulder: Secondary | ICD-10-CM | POA: Diagnosis not present

## 2023-06-04 DIAGNOSIS — Z01818 Encounter for other preprocedural examination: Secondary | ICD-10-CM | POA: Insufficient documentation

## 2023-06-04 HISTORY — DX: Unspecified urinary incontinence: R32

## 2023-06-04 HISTORY — DX: Unspecified glaucoma: H40.9

## 2023-06-04 HISTORY — DX: Peripheral vascular disease, unspecified: I73.9

## 2023-06-04 HISTORY — DX: Dyspnea, unspecified: R06.00

## 2023-06-04 HISTORY — DX: Sleep apnea, unspecified: G47.30

## 2023-06-04 HISTORY — DX: Angina pectoris, unspecified: I20.9

## 2023-06-04 HISTORY — DX: Unspecified macular degeneration: H35.30

## 2023-06-04 LAB — CBC
HCT: 31.3 % — ABNORMAL LOW (ref 36.0–46.0)
Hemoglobin: 9.9 g/dL — ABNORMAL LOW (ref 12.0–15.0)
MCH: 31.3 pg (ref 26.0–34.0)
MCHC: 31.6 g/dL (ref 30.0–36.0)
MCV: 99.1 fL (ref 80.0–100.0)
Platelets: 308 10*3/uL (ref 150–400)
RBC: 3.16 MIL/uL — ABNORMAL LOW (ref 3.87–5.11)
RDW: 14.1 % (ref 11.5–15.5)
WBC: 7.5 10*3/uL (ref 4.0–10.5)
nRBC: 0 % (ref 0.0–0.2)

## 2023-06-04 LAB — BASIC METABOLIC PANEL
Anion gap: 8 (ref 5–15)
BUN: 32 mg/dL — ABNORMAL HIGH (ref 8–23)
CO2: 24 mmol/L (ref 22–32)
Calcium: 8.7 mg/dL — ABNORMAL LOW (ref 8.9–10.3)
Chloride: 107 mmol/L (ref 98–111)
Creatinine, Ser: 1.49 mg/dL — ABNORMAL HIGH (ref 0.44–1.00)
GFR, Estimated: 36 mL/min — ABNORMAL LOW (ref >60)
Glucose, Bld: 117 mg/dL — ABNORMAL HIGH (ref 70–99)
Potassium: 4 mmol/L (ref 3.5–5.1)
Sodium: 139 mmol/L (ref 135–145)

## 2023-06-04 LAB — GLUCOSE, CAPILLARY: Glucose-Capillary: 114 mg/dL — ABNORMAL HIGH (ref 70–99)

## 2023-06-04 LAB — HEMOGLOBIN A1C
Hgb A1c MFr Bld: 5.5 % (ref 4.8–5.6)
Mean Plasma Glucose: 111 mg/dL

## 2023-06-04 LAB — SURGICAL PCR SCREEN
MRSA, PCR: NEGATIVE
Staphylococcus aureus: NEGATIVE

## 2023-06-10 NOTE — Progress Notes (Addendum)
Case: [id number] Date/Time: 06/17/23 0715   Procedure: REVERSE SHOULDER ARTHROPLASTY (Right: Shoulder)   Anesthesia type: General   Pre-op diagnosis: djd right shoulder   Location: [facility] ROOM 08 / [facility]   Surgeons: Teryl Lucy, MD       DISCUSSION: Tricia Harrison is a 77 yo female who presents to PAT prior to surgery above. PMH significant for former smoking, HTN, HLD, hx of PE on chronic anticoagulation, heart murmur (AI), OSA (does not use CPAP), PVCs, DOE, GERD, hiatal hernia, hypothyroidism, diabetes, chronic back pain, arthritis.  Prior anesthesia complications include PONV.  Patient follows with Cardiology for HTN, PVCs, and DOE. Last seen in clinic on 03/13/23. She reported ongoing DOE and was noted to be deconditioned. An echo was recommended due to upcoming shoulder surgery and showed normal EF with mild-moderate aortic valve insufficiency. No further testing recommended. Blood pressure noted to be well controlled. Advised to f/u in 9-12 months.  Patient follows with [facility] for her CKD. Kidney function at baseline on PAT screening labs. Clearance in chart that patient is moderate risk and medically optimized by Dr. Kathrene Bongo on 02/19/23 (paper copy in chart). There are instructions to hold valsartan 48 hours before and after surgery.  Patient previously followed with Neurology for hx of tremors. Last seen in clinic on 09/24/2022. MRI of brain was obtained which was normal. Does not need ongoing neuro follow up. Neuro clearance received on 02/07/23 that she is low risk from a neurologic standpoint (paper copy in chart).  Patient follows with PCP for other chronic medical issues. Last seen on 05/28/23. Clearance received that she is optimized medically and moderate risk on 03/09/23 (paper copy in chart)  Eliquis will be held 48 hours prior to surgery   VS: BP (!) 147/77   Pulse 68   Temp 37 C   Resp 16   SpO2 100%   PROVIDERS: PCP: Dibas Koirala  Cardiologist :   [facility]- DR Lynda Rainwater [facility]- DR Frances Furbish   LABS:  Hgb 9.9. Pt with chronic anemia which is at her baseline. Surgeon's office notified. (all labs ordered are listed, but only abnormal results are displayed)  Labs Reviewed  BASIC METABOLIC PANEL - Abnormal; Notable for the following components:      Result Value   Glucose, Bld 117 (*)    BUN 32 (*)    Creatinine, Ser 1.49 (*)    Calcium 8.7 (*)    GFR, Estimated 36 (*)    All other components within normal limits  CBC - Abnormal; Notable for the following components:   RBC 3.16 (*)    Hemoglobin 9.9 (*)    HCT 31.3 (*)    All other components within normal limits  GLUCOSE, CAPILLARY - Abnormal; Notable for the following components:   Glucose-Capillary 114 (*)    All other components within normal limits  SURGICAL PCR SCREEN  HEMOGLOBIN A1C     IMAGES:   EKG 03/13/23:  normal sinus rhythm left anterior fascicular block LVH 69 bpm-no acute changes    CV:  Echo 04/14/23:  IMPRESSIONS     1. Left ventricular ejection fraction, by estimation, is 60 to 65%. The  left ventricle has normal function. The left ventricle has no regional  wall motion abnormalities. Left ventricular diastolic parameters are  consistent with Grade I diastolic  dysfunction (impaired relaxation).   2. Right ventricular systolic function is normal. The right ventricular  size is normal. There is normal pulmonary artery systolic pressure.  3. Left atrial size was mildly dilated.   4. The mitral valve is normal in structure. Trivial mitral valve  regurgitation. No evidence of mitral stenosis.   5. The aortic valve is tricuspid. There is mild calcification of the  aortic valve. There is mild thickening of the aortic valve. Aortic valve  regurgitation is mild to moderate. No aortic stenosis is present.   6. The inferior vena cava is normal in size with greater than 50%  respiratory variability, suggesting right atrial pressure of 3  mmHg.   Holter monitor 02/02/21:  Monitor results Narrative & Impression    Predominant rhythm is normal sinus rhythm: Minimum rate 48 bpm, maximum 105 bpm with an average of 67 bpm.  Rare PACs with occasional ~1% PVCs. No real couplets or triplets. Very short burst of trigeminy.  9 Atrial Runs: Fastest was 13 beats at a rate of 135 bpm, longest was just under 17 seconds with a rate of 101 beats. -> These were not noted on symptoms  Symptoms noted with sinus rhythm and PVCs.  No abnormal tachycardic or bradycardic rhythms.  No pauses.   R/L heart cath 08/19/2018:  LV end diastolic pressure is normal. There is no aortic valve stenosis. Hemodynamic findings consistent with mild pulmonary hypertension. Nonobstructive CAD. CO 6.9 L/min; CI 3.6; PA 48/20, mean PA 26 mmHg; elevated RVSP 50 mm Hg. Ao sat 96%; PA sat 73%  Past Medical History:  Diagnosis Date   Anemia    as a child   Anginal pain (HCC)    Asthma    as a child   Bladder incontinence    Chronic back pain    spinal stenosis and buldging disc;scoliosis   Chronic constipation    occasionally takes something OTC   Chronic kidney disease    Diabetes mellitus without complication (HCC)    per pt "Pre" for 20+yrs   Diverticulosis    DVT (deep venous thrombosis) (HCC)    Dyspnea    occasional   Gallstones    has known about this for 3-56yrs   GERD (gastroesophageal reflux disease)    takes Omeprazole daily   Glaucoma    H/O hiatal hernia    Heart murmur    Hemorrhoids    History of blood transfusion    no abnormal reaction noted   History of bronchitis    states its been a long time ago   History of gout    HLD (hyperlipidemia)    takes Fenofibrate daily   HTN (hypertension)    takes Diltiazem and Ramipril daily   Hypothyroidism    takes Synthroid daily   Left knee DJD    Macular degeneration    Nocturia    Palpitations    started in 2013 and only occasionally;last time noticed about 2-3wks ago and  after drinking caffeine   Peripheral vascular disease (HCC)    PONV (postoperative nausea and vomiting)    problems swallowing after anethesia- 2/2023and slurred speech also   Pulmonary embolism with acute cor pulmonale (HCC) 07/2018   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   Sleep apnea    does not uses cpap    Past Surgical History:  Procedure Laterality Date   3-day EVENT MONITOR  01/2019   Mostly normal monitor.  Predominantly sinus rhythm (rate range 48-105 bpm, average 67 bpm) 9 short atrial runs (fastest = 13 beats rate 135 bpm, longest ~  17 seconds-101 bpm) -> not noted on symptom log.  No sustained arrhythmias (tachycardia or bradycardia), or pauses..  Symptoms are noted with PVCs.   bilateral cataract surgery      CHOLECYSTECTOMY N/A 01/10/2022   Procedure: LAPAROSCOPIC CHOLECYSTECTOMY WITH INTRAOPERATIVE CHOLANGIOGRAM;  Surgeon: Karie Soda, MD;  Location: [facility];  Service: General;  Laterality: N/A;   COLONOSCOPY     CT ANGIOGRAM OF CHEST - PE PROTOCOL  07/2018   Bilateral nonocclusive pulmonary emboli evidence of right heart strain consistent with "submassive "/intermediate PE.  -->  Code PE called.   DILATION AND CURETTAGE OF UTERUS     multiple about 6-7 times   ESOPHAGOGASTRODUODENOSCOPY     NM VQ LUNG SCAN ([facility] HX)  11/23/2018   Low probability for PE   RIGHT/LEFT HEART CATH AND CORONARY ANGIOGRAPHY N/A 08/19/2018   Procedure: RIGHT/LEFT HEART CATH AND CORONARY ANGIOGRAPHY;  Surgeon: Corky Crafts, MD;  Location: [facility] INVASIVE CV LAB;  Service: Cardiovascular:: Nonobstructive CAD.  Mild pulmonary pretension.  Mean PA pressure 26 mmHg with PA P 48/20 mmHg.  Normal LVEDP.   THYROID SURGERY Right 2010   TOTAL HIP ARTHROPLASTY Left 2005   TOTAL KNEE ARTHROPLASTY Left 02/21/2014   Procedure: TOTAL KNEE ARTHROPLASTY;  Surgeon: Nilda Simmer, MD;  Location: [facility];  Service: Orthopedics;  Laterality:  Left;   TRANSTHORACIC ECHOCARDIOGRAM  07/2018    (In setting of acute PE) mild LVH.  EF 55 to 60%.  GR 1 DD.  Aortic sclerosis but no stenosis.  Mild regurgitation.  Mild RA dilation.  Moderate LA dilation.  Moderate tricuspid regurgitation with severe primary hypertension estimated PA pressure 71 mmHg.  RV poorly visualized   TRANSTHORACIC ECHOCARDIOGRAM  10/2018 - 11/2019   a) EF 60-65%.  GR 1 DD.  Focal basal LVH. Mod AI/ no AS. Mild LA dilation.  Normal RV size and fxn.  Mod PI w/ mild TR.  Peak PAP ~ 38 mmHg; b) Normal EF 60 to 65%.  Moderate LVH-GR 1 DD.  R WMA.  Mild AS w/ trivial AI.  Grossly normal PA, RV and RA size.  Normal RV function.  Normal PA pressures.   TRANSTHORACIC ECHOCARDIOGRAM  01/2021    EF 55%.  Normal LV function.  Mild LVH.  GR 1 DD.  Normal PA pressures.  Severe LA dilation.  (Consistent with more significant diastolic dysfunction).  Mild-possibly mild to moderate aortic insufficiency.  Borderline elevated RAP.  Trivial pericardial effusion.  (Stable)   VAGINAL HYSTERECTOMY  1979    MEDICATIONS:  acetaminophen (TYLENOL) 650 MG CR tablet   albuterol (VENTOLIN HFA) 108 (90 Base) MCG/ACT inhaler   apixaban (ELIQUIS) 5 MG TABS tablet   bimatoprost (LUMIGAN) 0.01 % SOLN   carvedilol (COREG) 3.125 MG tablet   carvedilol (COREG) 6.25 MG tablet   Cholecalciferol (VITAMIN D3 PO)   cloNIDine (CATAPRES) 0.1 MG tablet   COVID-19 mRNA vaccine, Moderna, 100 MCG/0.5ML injection   diclofenac Sodium (VOLTAREN) 1 % GEL   diltiazem (CARDIZEM CD) 180 MG 24 hr capsule   diltiazem (CARDIZEM LA) 180 MG 24 hr tablet   fenofibrate (TRICOR) 145 MG tablet   HUMIRA PEN 40 MG/0.4ML PNKT   ketorolac (ACULAR) 0.5 % ophthalmic solution   levothyroxine (SYNTHROID, LEVOTHROID) 50 MCG tablet   lidocaine (LIDODERM) 5 %   omeprazole (PRILOSEC) 20 MG capsule   ondansetron (ZOFRAN) 4 MG tablet   Plecanatide (TRULANCE) 3 MG TABS   polyethylene glycol (MIRALAX / GLYCOLAX) 17 g packet  rosuvastatin  (CRESTOR) 20 MG tablet   traMADol (ULTRAM) 50 MG tablet   valsartan (DIOVAN) 80 MG tablet   No current facility-administered medications for this encounter.   Marcille Blanco [facility] Short Stay/Anesthesiology [facility] Phone [phone] 06/12/2023 8:42 AM

## 2023-06-12 NOTE — Anesthesia Preprocedure Evaluation (Signed)
Anesthesia Evaluation  Patient identified by MRN, date of birth, ID band Patient awake    Reviewed: Allergy & Precautions, NPO status , Patient's Chart, lab work & pertinent test results  History of Anesthesia Complications (+) PONV and history of anesthetic complications  Airway Mallampati: II  TM Distance: >3 FB Neck ROM: Limited  Mouth opening: Limited Mouth Opening  Dental  (+) Dental Advisory Given   Pulmonary sleep apnea , COPD,  COPD inhaler, former smoker   breath sounds clear to auscultation       Cardiovascular hypertension, Pt. on medications (-) angina + CAD (non-obstructive), + Peripheral Vascular Disease and + DVT  + Valvular Problems/Murmurs AI  Rhythm:Regular Rate:Normal  03/2023 ECHO: EF 60-65% 1. The LV has normal function, no regional wall motion abnormalities. Grade I diastolic dysfunction (impaired relaxation).  2. RVF is normal. The right ventricular size is normal. There is normal pulmonary artery systolic pressure.  3. Left atrial size was mildly dilated.  4. The mitral valve is normal in structure. Trivial MR. No evidence of mitral stenosis.  5. The aortic valve is tricuspid. There is mild calcification of the aortic valve. There is mild thickening of the aortic valve. Aortic valve regurgitation is mild to moderate. No aortic stenosis is present.    Neuro/Psych    GI/Hepatic Neg liver ROS, hiatal hernia,GERD  Medicated and Controlled,,  Endo/Other  diabetes (glu 82)Hypothyroidism    Renal/GU Renal InsufficiencyRenal disease     Musculoskeletal  (+) Arthritis ,    Abdominal   Peds  Hematology  (+) Blood dyscrasia (Hb 9.9), anemia eliquis   Anesthesia Other Findings   Reproductive/Obstetrics                             Anesthesia Physical Anesthesia Plan  ASA: 3  Anesthesia Plan: General   Post-op Pain Management: Tylenol PO (pre-op)* and Regional block*    Induction: Intravenous  PONV Risk Score and Plan: 4 or greater and Ondansetron, Dexamethasone and Treatment may vary due to age or medical condition  Airway Management Planned: Oral ETT and Video Laryngoscope Planned  Additional Equipment: None  Intra-op Plan:   Post-operative Plan: Extubation in OR  Informed Consent: I have reviewed the patients History and Physical, chart, labs and discussed the procedure including the risks, benefits and alternatives for the proposed anesthesia with the patient or authorized representative who has indicated his/her understanding and acceptance.     Dental advisory given  Plan Discussed with: CRNA and Surgeon  Anesthesia Plan Comments: (See PAT note from 7/10 by K Gekas PA-C Plan routine monitors, GETA with interscalene block for post op analgesia )        Anesthesia Quick Evaluation

## 2023-06-15 NOTE — H&P (Signed)
SHOULDER ARTHROPLASTY ADMISSION H&P  Patient ID: Tricia Harrison MRN: [id number] DOB/AGE: [birth year] 77 y.o.  Chief Complaint: right shoulder pain.  Planned Procedure Date: 06/16/23 Medical Clearance by Dr. Docia Chuck Cardiac Clearance by Bernadene Person, NP Additional clearance by Dr. Peggye Ley, MD  HPI: Tricia Harrison is a 77 y.o. female who presents for evaluation of djd right shoulder. The patient has a history of pain and functional disability in the right shoulder due to trauma and arthritis and has failed non-surgical conservative treatments for greater than 12 weeks to include NSAID's and/or analgesics and activity modification.  Onset of symptoms was gradual, starting 3 years ago with rapidlly worsening course since that time. The patient noted no past surgery on the right shoulder.  Patient currently rates pain at 7 out of 10 with activity. Patient has night pain, worsening of pain with activity and weight bearing, pain that interferes with activities of daily living, and pain with passive range of motion.  Patient has evidence of joint space narrowing by imaging studies.  There is no active infection.  Past Medical History:  Diagnosis Date   Anemia    as a child   Anginal pain (HCC)    Asthma    as a child   Bladder incontinence    Chronic back pain    spinal stenosis and buldging disc;scoliosis   Chronic constipation    occasionally takes something OTC   Chronic kidney disease    Diabetes mellitus without complication (HCC)    per pt "Pre" for 20+yrs   Diverticulosis    DVT (deep venous thrombosis) (HCC)    Dyspnea    occasional   Gallstones    has known about this for 3-37yrs   GERD (gastroesophageal reflux disease)    takes Omeprazole daily   Glaucoma    H/O hiatal hernia    Heart murmur    Hemorrhoids    History of blood transfusion    no abnormal reaction noted   History of bronchitis    states its been a long time ago   History of gout    HLD  (hyperlipidemia)    takes Fenofibrate daily   HTN (hypertension)    takes Diltiazem and Ramipril daily   Hypothyroidism    takes Synthroid daily   Left knee DJD    Macular degeneration    Nocturia    Palpitations    started in 2013 and only occasionally;last time noticed about 2-3wks ago and after drinking caffeine   Peripheral vascular disease (HCC)    PONV (postoperative nausea and vomiting)    problems swallowing after anethesia- 2/2023and slurred speech also   Pulmonary embolism with acute cor pulmonale (HCC) 07/2018   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   Sleep apnea    does not uses cpap   Past Surgical History:  Procedure Laterality Date   3-day EVENT MONITOR  01/2019   Mostly normal monitor.  Predominantly sinus rhythm (rate range 48-105 bpm, average 67 bpm) 9 short atrial runs (fastest = 13 beats rate 135 bpm, longest ~17 seconds-101 bpm) -> not noted on symptom log.  No sustained arrhythmias (tachycardia or bradycardia), or pauses..  Symptoms are noted with PVCs.   bilateral cataract surgery      CHOLECYSTECTOMY N/A 01/10/2022   Procedure: LAPAROSCOPIC CHOLECYSTECTOMY WITH INTRAOPERATIVE CHOLANGIOGRAM;  Surgeon: Karie Soda, MD;  Location: [facility];  Service: General;  Laterality: N/A;  COLONOSCOPY     CT ANGIOGRAM OF CHEST - PE PROTOCOL  07/2018   Bilateral nonocclusive pulmonary emboli evidence of right heart strain consistent with "submassive "/intermediate PE.  -->  Code PE called.   DILATION AND CURETTAGE OF UTERUS     multiple about 6-7 times   ESOPHAGOGASTRODUODENOSCOPY     NM VQ LUNG SCAN ([facility] HX)  11/23/2018   Low probability for PE   RIGHT/LEFT HEART CATH AND CORONARY ANGIOGRAPHY N/A 08/19/2018   Procedure: RIGHT/LEFT HEART CATH AND CORONARY ANGIOGRAPHY;  Surgeon: Corky Crafts, MD;  Location: [facility] INVASIVE CV LAB;  Service: Cardiovascular:: Nonobstructive CAD.  Mild pulmonary pretension.   Mean PA pressure 26 mmHg with PA P 48/20 mmHg.  Normal LVEDP.   THYROID SURGERY Right 2010   TOTAL HIP ARTHROPLASTY Left 2005   TOTAL KNEE ARTHROPLASTY Left 02/21/2014   Procedure: TOTAL KNEE ARTHROPLASTY;  Surgeon: Nilda Simmer, MD;  Location: [facility] OR;  Service: Orthopedics;  Laterality: Left;   TRANSTHORACIC ECHOCARDIOGRAM  07/2018    (In setting of acute PE) mild LVH.  EF 55 to 60%.  GR 1 DD.  Aortic sclerosis but no stenosis.  Mild regurgitation.  Mild RA dilation.  Moderate LA dilation.  Moderate tricuspid regurgitation with severe primary hypertension estimated PA pressure 71 mmHg.  RV poorly visualized   TRANSTHORACIC ECHOCARDIOGRAM  10/2018 - 11/2019   a) EF 60-65%.  GR 1 DD.  Focal basal LVH. Mod AI/ no AS. Mild LA dilation.  Normal RV size and fxn.  Mod PI w/ mild TR.  Peak PAP ~ 38 mmHg; b) Normal EF 60 to 65%.  Moderate LVH-GR 1 DD.  R WMA.  Mild AS w/ trivial AI.  Grossly normal PA, RV and RA size.  Normal RV function.  Normal PA pressures.   TRANSTHORACIC ECHOCARDIOGRAM  01/2021    EF 55%.  Normal LV function.  Mild LVH.  GR 1 DD.  Normal PA pressures.  Severe LA dilation.  (Consistent with more significant diastolic dysfunction).  Mild-possibly mild to moderate aortic insufficiency.  Borderline elevated RAP.  Trivial pericardial effusion.  (Stable)   VAGINAL HYSTERECTOMY  1979   Allergies  Allergen Reactions   Atorvastatin Other (See Comments)    Muscle pain   Codeine Nausea And Vomiting   Morphine And Codeine     B/p drops    Prior to Admission medications   Medication Sig Start Date End Date Taking? Authorizing Provider  acetaminophen (TYLENOL) 650 MG CR tablet Take 650 mg by mouth every 8 (eight) hours as needed for pain.   Yes [provider]  albuterol (VENTOLIN HFA) 108 (90 Base) MCG/ACT inhaler Inhale 2 puffs into the lungs every 6 (six) hours as needed. 07/02/19  Yes Coral Ceo, NP  apixaban (ELIQUIS) 5 MG TABS tablet TAKE 1 TABLET BY MOUTH TWICE  DAILY  04/01/23  Yes Marykay Lex, MD  bimatoprost (LUMIGAN) 0.01 % SOLN Place 1 drop into both eyes at bedtime. 02/06/21  Yes [provider]  carvedilol (COREG) 3.125 MG tablet Take 1 tablet (3.125 mg total) by mouth 2 (two) times daily with a meal. Patient taking differently: Take 3.125 mg by mouth daily with supper. 01/14/22  Yes Karie Soda, MD  carvedilol (COREG) 6.25 MG tablet Take 6.25 mg by mouth in the morning.   Yes [provider]  Cholecalciferol (VITAMIN D3 PO) Take 1 tablet by mouth daily.   Yes [provider]  cloNIDine (CATAPRES) 0.1 MG tablet  Take 1 tablet (0.1 mg total) by mouth 2 (two) times daily as needed (for blood pressure above 180 when she is SEATEd--not lying down). 01/14/22 05/30/23 Yes Rhetta Mura, MD  diclofenac Sodium (VOLTAREN) 1 % GEL Apply 2 g topically daily as needed (pain).   Yes [provider]  diltiazem (CARDIZEM LA) 180 MG 24 hr tablet Take 180 mg by mouth daily.   Yes [provider]  fenofibrate (TRICOR) 145 MG tablet Take 145 mg by mouth daily.   Yes [provider]  HUMIRA PEN 40 MG/0.4ML PNKT Inject 40 mg into the skin every 14 (fourteen) days. 01/22/21  Yes [provider]  ketorolac (ACULAR) 0.5 % ophthalmic solution Place 1 drop into both eyes in the morning, at noon, and at bedtime. Verified correct strength per Ophthalmalogist progress notes   Yes [provider]  levothyroxine (SYNTHROID, LEVOTHROID) 50 MCG tablet Take 50 mcg by mouth daily before breakfast.   Yes [provider]  lidocaine (LIDODERM) 5 % Place 1 patch onto the skin daily as needed (pain). 04/14/23  Yes [provider]  omeprazole (PRILOSEC) 20 MG capsule Take 20 mg by mouth daily.   Yes [provider]  Plecanatide (TRULANCE) 3 MG TABS Take 3 mg by mouth daily as needed (constipation).   Yes [provider]  polyethylene glycol (MIRALAX / GLYCOLAX) 17 g packet Take 17 g by  mouth daily as needed for mild constipation.   Yes [provider]  rosuvastatin (CRESTOR) 20 MG tablet TAKE 1 TABLET BY MOUTH  DAILY AT 6 PM. 07/08/22  Yes Marykay Lex, MD  valsartan (DIOVAN) 80 MG tablet Take 1 tablet (80 mg total) by mouth daily. Patient taking differently: Take 80 mg by mouth daily as needed (blood pressure above 180). 03/13/23  Yes Cleaver, Thomasene Ripple, NP  COVID-19 mRNA vaccine, Moderna, 100 MCG/0.5ML injection Inject into the muscle. Patient not taking: Reported on 05/30/2023 08/30/21   Judyann Munson, MD  diltiazem (CARDIZEM CD) 180 MG 24 hr capsule Take 1 capsule (180 mg total) by mouth daily. Patient not taking: Reported on 05/30/2023 01/15/22   Karie Soda, MD  ondansetron (ZOFRAN) 4 MG tablet Take 1 tablet (4 mg total) by mouth every 8 (eight) hours as needed for nausea. Patient not taking: Reported on 12/04/2022 01/10/22   Karie Soda, MD  traMADol (ULTRAM) 50 MG tablet Take 1-2 tablets (50-100 mg total) by mouth every 6 (six) hours as needed for moderate pain or severe pain. Patient not taking: Reported on 05/30/2023 01/10/22   Karie Soda, MD   Social History   Socioeconomic History   Marital status: Single    Spouse name: Not on file   Number of children: Not on file   Years of education: Not on file   Highest education level: Not on file  Occupational History   Occupation: Retired  Tobacco Use   Smoking status: Former    Current packs/day: 0.00    Average packs/day: 0.5 packs/day for 29.2 years (14.6 ttl pk-yrs)    Types: Cigarettes    Start date: 09/04/1964    Quit date: 11/25/1993    Years since quitting: 29.5   Smokeless tobacco: Never   Tobacco comments:    quit smoking in 1995  Vaping Use   Vaping status: Never Used  Substance and Sexual Activity   Alcohol use: Yes    Comment: Socially.   Drug use: No   Sexual activity: Not on file  Other Topics Concern  Not on file  Social History Narrative   Pt lives w/ sister.   Social  Determinants of Health   Financial Resource Strain: Not on file  Food Insecurity: Not on file  Transportation Needs: Not on file  Physical Activity: Not on file  Stress: Not on file  Social Connections: Not on file   Family History  Problem Relation Age of Onset   Uterine cancer Mother    Heart disease Mother    Hypertension Mother    Diabetes Mother    Cirrhosis Father    Pneumonia Father    Lymphoma Sister        ,non hodgkin    Diabetes Sister    Breast cancer Neg Hx     ROS: Currently denies lightheadedness, dizziness, Fever, chills, CP, SOB. Personal history of DVT, PE, and CVA. No Hx of MI.  No loose teeth or dentures All other systems have been reviewed and were otherwise currently negative with the exception of those mentioned in the HPI and as above.  BMI: Estimated body mass index is 30.76 kg/m as calculated from the following:   Height as of 03/13/23: 5\' 1"  (1.549 m).   Weight as of 03/13/23: 73.8 kg.  Lab Results  Component Value Date   ALBUMIN 3.7 (L) 04/14/2023   Diabetes:   Patient has a diagnosis of diabetes,  Lab Results  Component Value Date   HGBA1C 5.5 06/04/2023   Smoking Status:       Objective: Vitals: Ht: 5\' 1"  Wt: 156.4 lbs Temp: 98.7 BP: 168/80 Pulse: 74 O2 97% on room air.   Physical Exam: General: Alert, NAD.   HEENT: EOMI, Good Neck Extension  Pulm: No increased work of breathing.  Clear B/L A/P w/o crackle or wheeze.  CV: RRR, No m/g/r appreciated  GI: soft, NT, ND Neuro: Neuro without gross focal deficit.  Sensation intact distally Skin: No lesions in the area of chief complaint MSK/Surgical Site: right shoulder pain with range of motion.  FF 0-60, profound cuff weakness. Int rotation to lateral right thigh. NVI distally.  Imaging Review MRI of right shoulder taken 3/6 showed full thickness rotator cuff tear, torn biceps and severe glenohumeral osteoarthritis  Assessment: Right shoulder rotator cuff  arthropathy    Plan: Plan for Procedure(s): REVERSE SHOULDER ARTHROPLASTY  The patient history, physical exam, clinical judgement of the provider and imaging are consistent with end stage degenerative joint disease and reverse total joint arthroplasty is deemed medically necessary. The treatment options including medical management, injection therapy, and arthroplasty were discussed at length. The risks and benefits of Procedure(s): REVERSE SHOULDER ARTHROPLASTY were presented and reviewed.  The risks of nonoperative treatment, versus surgical intervention including but not limited to continued pain, aseptic loosening, stiffness, dislocation/subluxation, infection, bleeding, nerve injury, blood clots, cardiopulmonary complications, morbidity, mortality, among others were discussed. The patient verbalizes understanding and wishes to proceed with the plan.  Patient is being admitted for surgery, OT, pain control, prophylactic antibiotics, VTE prophylaxis, progressive ambulation, ADL's and discharge planning.   The patient does not meet the criteria for TXA which will be used perioperatively.   The patient is planning to be discharged home care of family.   Armida Sans, PA-C 06/15/2023 1:45 PM

## 2023-06-16 DIAGNOSIS — D649 Anemia, unspecified: Secondary | ICD-10-CM | POA: Diagnosis present

## 2023-06-17 ENCOUNTER — Other Ambulatory Visit: Payer: Self-pay

## 2023-06-17 ENCOUNTER — Ambulatory Visit (HOSPITAL_COMMUNITY): Payer: Medicare HMO

## 2023-06-17 ENCOUNTER — Observation Stay (HOSPITAL_COMMUNITY)
Admission: RE | Admit: 2023-06-17 | Discharge: 2023-06-20 | Disposition: A | Discharge status: Skilled Nursing Facility | Payer: Medicare HMO | Attending: Orthopedic Surgery | Admitting: Orthopedic Surgery

## 2023-06-17 ENCOUNTER — Encounter (HOSPITAL_COMMUNITY): Payer: Self-pay | Admitting: Orthopedic Surgery

## 2023-06-17 ENCOUNTER — Ambulatory Visit (HOSPITAL_COMMUNITY): Payer: Medicare HMO | Admitting: Physician Assistant

## 2023-06-17 ENCOUNTER — Encounter (HOSPITAL_COMMUNITY)
Admission: RE | Disposition: A | Discharge status: Skilled Nursing Facility | Payer: Self-pay | Source: Home / Self Care | Attending: Orthopedic Surgery

## 2023-06-17 ENCOUNTER — Ambulatory Visit (HOSPITAL_BASED_OUTPATIENT_CLINIC_OR_DEPARTMENT_OTHER): Payer: Medicare HMO | Admitting: Certified Registered Nurse Anesthetist

## 2023-06-17 DIAGNOSIS — J45909 Unspecified asthma, uncomplicated: Secondary | ICD-10-CM | POA: Insufficient documentation

## 2023-06-17 DIAGNOSIS — N1832 Chronic kidney disease, stage 3b: Secondary | ICD-10-CM | POA: Diagnosis not present

## 2023-06-17 DIAGNOSIS — N189 Chronic kidney disease, unspecified: Secondary | ICD-10-CM | POA: Insufficient documentation

## 2023-06-17 DIAGNOSIS — Z96611 Presence of right artificial shoulder joint: Secondary | ICD-10-CM

## 2023-06-17 DIAGNOSIS — I129 Hypertensive chronic kidney disease with stage 1 through stage 4 chronic kidney disease, or unspecified chronic kidney disease: Secondary | ICD-10-CM

## 2023-06-17 DIAGNOSIS — M75101 Unspecified rotator cuff tear or rupture of right shoulder, not specified as traumatic: Secondary | ICD-10-CM | POA: Insufficient documentation

## 2023-06-17 DIAGNOSIS — Z96652 Presence of left artificial knee joint: Secondary | ICD-10-CM | POA: Insufficient documentation

## 2023-06-17 DIAGNOSIS — Z87891 Personal history of nicotine dependence: Secondary | ICD-10-CM | POA: Diagnosis not present

## 2023-06-17 DIAGNOSIS — Z7901 Long term (current) use of anticoagulants: Secondary | ICD-10-CM | POA: Insufficient documentation

## 2023-06-17 DIAGNOSIS — E039 Hypothyroidism, unspecified: Secondary | ICD-10-CM | POA: Insufficient documentation

## 2023-06-17 DIAGNOSIS — E1122 Type 2 diabetes mellitus with diabetic chronic kidney disease: Secondary | ICD-10-CM | POA: Diagnosis not present

## 2023-06-17 DIAGNOSIS — M12811 Other specific arthropathies, not elsewhere classified, right shoulder: Secondary | ICD-10-CM | POA: Diagnosis present

## 2023-06-17 DIAGNOSIS — Z96642 Presence of left artificial hip joint: Secondary | ICD-10-CM | POA: Insufficient documentation

## 2023-06-17 DIAGNOSIS — Z01818 Encounter for other preprocedural examination: Secondary | ICD-10-CM

## 2023-06-17 DIAGNOSIS — Z86718 Personal history of other venous thrombosis and embolism: Secondary | ICD-10-CM | POA: Insufficient documentation

## 2023-06-17 DIAGNOSIS — M19011 Primary osteoarthritis, right shoulder: Secondary | ICD-10-CM | POA: Diagnosis not present

## 2023-06-17 DIAGNOSIS — Z79899 Other long term (current) drug therapy: Secondary | ICD-10-CM | POA: Insufficient documentation

## 2023-06-17 DIAGNOSIS — D649 Anemia, unspecified: Secondary | ICD-10-CM | POA: Diagnosis present

## 2023-06-17 HISTORY — PX: REVERSE SHOULDER ARTHROPLASTY: SHX5054

## 2023-06-17 LAB — TYPE AND SCREEN
ABO/RH(D): A POS
Antibody Screen: NEGATIVE

## 2023-06-17 LAB — GLUCOSE, CAPILLARY: Glucose-Capillary: 82 mg/dL (ref 70–99)

## 2023-06-17 SURGERY — ARTHROPLASTY, SHOULDER, TOTAL, REVERSE
Anesthesia: General | Site: Shoulder | Laterality: Right

## 2023-06-17 MED ORDER — ONDANSETRON HCL 4 MG PO TABS: 4.0000 mg | ORAL_TABLET | Freq: Four times a day (QID) | ORAL | Status: DC | PRN

## 2023-06-17 MED ORDER — ACETAMINOPHEN 500 MG PO TABS
500.0000 mg | ORAL_TABLET | Freq: Four times a day (QID) | ORAL | Status: AC
  Administered 2023-06-18: 500 mg via ORAL
  Filled 2023-06-17: qty 1

## 2023-06-17 MED ORDER — FENTANYL CITRATE (PF) 100 MCG/2ML IJ SOLN
INTRAMUSCULAR | Status: AC
  Filled 2023-06-17: qty 2

## 2023-06-17 MED ORDER — POLYETHYLENE GLYCOL 3350 17 G PO PACK: 17.0000 g | PACK | Freq: Every day | ORAL | Status: DC | PRN

## 2023-06-17 MED ORDER — PHENYLEPHRINE HCL-NACL 20-0.9 MG/250ML-% IV SOLN
INTRAVENOUS | Status: DC | PRN
  Administered 2023-06-17: 30 ug/min via INTRAVENOUS

## 2023-06-17 MED ORDER — TRAMADOL HCL 50 MG PO TABS
50.0000 mg | ORAL_TABLET | ORAL | Status: DC | PRN
  Administered 2023-06-18 – 2023-06-20 (×5): 50 mg via ORAL
  Filled 2023-06-17 (×5): qty 1

## 2023-06-17 MED ORDER — LACTATED RINGERS IV SOLN: INTRAVENOUS | Status: DC

## 2023-06-17 MED ORDER — POTASSIUM CHLORIDE IN NACL 20-0.9 MEQ/L-% IV SOLN
INTRAVENOUS | Status: DC
  Filled 2023-06-17 (×4): qty 1000

## 2023-06-17 MED ORDER — CEFAZOLIN SODIUM-DEXTROSE 1-4 GM/50ML-% IV SOLN
1.0000 g | Freq: Four times a day (QID) | INTRAVENOUS | Status: AC
  Administered 2023-06-17 (×2): 1 g via INTRAVENOUS
  Filled 2023-06-17 (×3): qty 50

## 2023-06-17 MED ORDER — ROSUVASTATIN CALCIUM 20 MG PO TABS
20.0000 mg | ORAL_TABLET | Freq: Every day | ORAL | Status: DC
  Administered 2023-06-17 – 2023-06-19 (×3): 20 mg via ORAL
  Filled 2023-06-17 (×3): qty 1

## 2023-06-17 MED ORDER — LEVOTHYROXINE SODIUM 50 MCG PO TABS
50.0000 ug | ORAL_TABLET | Freq: Every day | ORAL | Status: DC
  Administered 2023-06-18 – 2023-06-20 (×3): 50 ug via ORAL
  Filled 2023-06-17 (×3): qty 1

## 2023-06-17 MED ORDER — FENTANYL CITRATE (PF) 100 MCG/2ML IJ SOLN
INTRAMUSCULAR | Status: DC | PRN
  Administered 2023-06-17 (×2): 50 ug via INTRAVENOUS

## 2023-06-17 MED ORDER — MAGNESIUM CITRATE PO SOLN: 1.0000 | Freq: Once | ORAL | Status: DC | PRN

## 2023-06-17 MED ORDER — ROCURONIUM BROMIDE 10 MG/ML (PF) SYRINGE
PREFILLED_SYRINGE | INTRAVENOUS | Status: DC | PRN
  Administered 2023-06-17: 60 mg via INTRAVENOUS
  Administered 2023-06-17: 20 mg via INTRAVENOUS

## 2023-06-17 MED ORDER — KETOROLAC TROMETHAMINE 0.5 % OP SOLN
1.0000 [drp] | Freq: Three times a day (TID) | OPHTHALMIC | Status: DC
  Administered 2023-06-18 – 2023-06-20 (×2): 1 [drp] via OPHTHALMIC
  Filled 2023-06-17: qty 5

## 2023-06-17 MED ORDER — BUPIVACAINE HCL (PF) 0.5 % IJ SOLN: INTRAMUSCULAR | Status: DC | PRN

## 2023-06-17 MED ORDER — STERILE WATER FOR IRRIGATION IR SOLN
Status: DC | PRN
  Administered 2023-06-17: 2000 mL

## 2023-06-17 MED ORDER — BISACODYL 10 MG RE SUPP: 10.0000 mg | Freq: Every day | RECTAL | Status: DC | PRN

## 2023-06-17 MED ORDER — CHLORHEXIDINE GLUCONATE 0.12 % MT SOLN
15.0000 mL | Freq: Once | OROMUCOSAL | Status: AC
  Administered 2023-06-17: 15 mL via OROMUCOSAL

## 2023-06-17 MED ORDER — DICLOFENAC SODIUM 1 % EX GEL: 2.0000 g | Freq: Every day | CUTANEOUS | Status: DC | PRN

## 2023-06-17 MED ORDER — FENOFIBRATE 54 MG PO TABS
54.0000 mg | ORAL_TABLET | Freq: Every day | ORAL | Status: DC
  Administered 2023-06-18 – 2023-06-20 (×3): 54 mg via ORAL
  Filled 2023-06-17 (×3): qty 1

## 2023-06-17 MED ORDER — SUGAMMADEX SODIUM 200 MG/2ML IV SOLN
INTRAVENOUS | Status: DC | PRN
  Administered 2023-06-17: 200 mg via INTRAVENOUS

## 2023-06-17 MED ORDER — FENTANYL CITRATE PF 50 MCG/ML IJ SOSY: 25.0000 ug | PREFILLED_SYRINGE | INTRAMUSCULAR | Status: DC | PRN

## 2023-06-17 MED ORDER — MENTHOL 3 MG MT LOZG: 1.0000 | LOZENGE | OROMUCOSAL | Status: DC | PRN

## 2023-06-17 MED ORDER — ACETAMINOPHEN 325 MG PO TABS
325.0000 mg | ORAL_TABLET | Freq: Four times a day (QID) | ORAL | Status: DC | PRN
  Administered 2023-06-18 – 2023-06-19 (×4): 650 mg via ORAL
  Filled 2023-06-17 (×4): qty 2

## 2023-06-17 MED ORDER — LIDOCAINE HCL (PF) 2 % IJ SOLN
INTRAMUSCULAR | Status: AC
  Filled 2023-06-17: qty 5

## 2023-06-17 MED ORDER — HYDROCODONE-ACETAMINOPHEN 5-325 MG PO TABS
1.0000 | ORAL_TABLET | ORAL | Status: DC | PRN
  Administered 2023-06-17 (×3): 2 via ORAL
  Filled 2023-06-17 (×3): qty 2

## 2023-06-17 MED ORDER — ORAL CARE MOUTH RINSE: 15.0000 mL | Freq: Once | OROMUCOSAL | Status: AC

## 2023-06-17 MED ORDER — LATANOPROST 0.005 % OP SOLN
1.0000 [drp] | Freq: Every day | OPHTHALMIC | Status: DC
  Filled 2023-06-17: qty 2.5

## 2023-06-17 MED ORDER — PANTOPRAZOLE SODIUM 40 MG PO TBEC
40.0000 mg | DELAYED_RELEASE_TABLET | Freq: Every day | ORAL | Status: DC
  Administered 2023-06-17 – 2023-06-20 (×4): 40 mg via ORAL
  Filled 2023-06-17 (×4): qty 1

## 2023-06-17 MED ORDER — DILTIAZEM HCL ER COATED BEADS 180 MG PO CP24
180.0000 mg | ORAL_CAPSULE | Freq: Every day | ORAL | Status: DC
  Administered 2023-06-18 – 2023-06-20 (×3): 180 mg via ORAL
  Filled 2023-06-17 (×3): qty 1

## 2023-06-17 MED ORDER — DEXAMETHASONE SODIUM PHOSPHATE 10 MG/ML IJ SOLN
INTRAMUSCULAR | Status: AC
  Filled 2023-06-17: qty 1

## 2023-06-17 MED ORDER — ONDANSETRON HCL 4 MG/2ML IJ SOLN: 4.0000 mg | Freq: Four times a day (QID) | INTRAMUSCULAR | Status: DC | PRN

## 2023-06-17 MED ORDER — METOCLOPRAMIDE HCL 5 MG/ML IJ SOLN: 5.0000 mg | Freq: Three times a day (TID) | INTRAMUSCULAR | Status: DC | PRN

## 2023-06-17 MED ORDER — BUPIVACAINE HCL 0.25 % IJ SOLN
INTRAMUSCULAR | Status: AC
  Filled 2023-06-17: qty 1

## 2023-06-17 MED ORDER — DEXAMETHASONE SODIUM PHOSPHATE 4 MG/ML IJ SOLN
INTRAMUSCULAR | Status: DC | PRN
  Administered 2023-06-17: 5 mg via INTRAVENOUS

## 2023-06-17 MED ORDER — LIDOCAINE 2% (20 MG/ML) 5 ML SYRINGE
INTRAMUSCULAR | Status: DC | PRN
  Administered 2023-06-17: 20 mg via INTRAVENOUS

## 2023-06-17 MED ORDER — ONDANSETRON HCL 4 MG/2ML IJ SOLN
INTRAMUSCULAR | Status: DC | PRN
  Administered 2023-06-17: 4 mg via INTRAVENOUS

## 2023-06-17 MED ORDER — BUPIVACAINE LIPOSOME 1.3 % IJ SUSP
INTRAMUSCULAR | Status: DC | PRN
  Administered 2023-06-17: 10 mL via PERINEURAL

## 2023-06-17 MED ORDER — ACETAMINOPHEN 500 MG PO TABS
1000.0000 mg | ORAL_TABLET | Freq: Once | ORAL | Status: AC
  Administered 2023-06-17: 1000 mg via ORAL
  Filled 2023-06-17: qty 2

## 2023-06-17 MED ORDER — PHENOL 1.4 % MT LIQD
1.0000 | OROMUCOSAL | Status: DC | PRN
  Administered 2023-06-17 – 2023-06-19 (×2): 1 via OROMUCOSAL
  Filled 2023-06-17: qty 177

## 2023-06-17 MED ORDER — ACETAMINOPHEN 500 MG PO TABS: 1000.0000 mg | ORAL_TABLET | Freq: Once | ORAL | Status: DC

## 2023-06-17 MED ORDER — ALBUTEROL SULFATE (2.5 MG/3ML) 0.083% IN NEBU: 3.0000 mL | INHALATION_SOLUTION | Freq: Four times a day (QID) | RESPIRATORY_TRACT | Status: DC | PRN

## 2023-06-17 MED ORDER — DOCUSATE SODIUM 100 MG PO CAPS
100.0000 mg | ORAL_CAPSULE | Freq: Two times a day (BID) | ORAL | Status: DC
  Administered 2023-06-17 – 2023-06-20 (×6): 100 mg via ORAL
  Filled 2023-06-17 (×6): qty 1

## 2023-06-17 MED ORDER — 0.9 % SODIUM CHLORIDE (POUR BTL) OPTIME
TOPICAL | Status: DC | PRN
  Administered 2023-06-17: 1000 mL

## 2023-06-17 MED ORDER — BUPIVACAINE HCL (PF) 0.5 % IJ SOLN
INTRAMUSCULAR | Status: DC | PRN
  Administered 2023-06-17: 10 mL via PERINEURAL

## 2023-06-17 MED ORDER — ROCURONIUM BROMIDE 10 MG/ML (PF) SYRINGE
PREFILLED_SYRINGE | INTRAVENOUS | Status: AC
  Filled 2023-06-17: qty 10

## 2023-06-17 MED ORDER — METOCLOPRAMIDE HCL 5 MG PO TABS: 5.0000 mg | ORAL_TABLET | Freq: Three times a day (TID) | ORAL | Status: DC | PRN

## 2023-06-17 MED ORDER — APIXABAN 5 MG PO TABS
5.0000 mg | ORAL_TABLET | Freq: Two times a day (BID) | ORAL | Status: DC
  Administered 2023-06-19 – 2023-06-20 (×3): 5 mg via ORAL
  Filled 2023-06-17 (×3): qty 1

## 2023-06-17 MED ORDER — CARVEDILOL 3.125 MG PO TABS
3.1250 mg | ORAL_TABLET | Freq: Every day | ORAL | Status: DC
  Administered 2023-06-17 – 2023-06-19 (×3): 3.125 mg via ORAL
  Filled 2023-06-17 (×3): qty 1

## 2023-06-17 MED ORDER — CEFAZOLIN SODIUM-DEXTROSE 2-4 GM/100ML-% IV SOLN
2.0000 g | INTRAVENOUS | Status: AC
  Administered 2023-06-17: 2 g via INTRAVENOUS
  Filled 2023-06-17: qty 100

## 2023-06-17 MED ORDER — ONDANSETRON HCL 4 MG/2ML IJ SOLN
INTRAMUSCULAR | Status: AC
  Filled 2023-06-17: qty 2

## 2023-06-17 MED ORDER — PROPOFOL 10 MG/ML IV BOLUS
INTRAVENOUS | Status: AC
  Filled 2023-06-17: qty 20

## 2023-06-17 MED ORDER — CARVEDILOL 6.25 MG PO TABS
6.2500 mg | ORAL_TABLET | Freq: Every morning | ORAL | Status: DC
  Administered 2023-06-18 – 2023-06-20 (×3): 6.25 mg via ORAL
  Filled 2023-06-17 (×3): qty 1

## 2023-06-17 MED ORDER — POVIDONE-IODINE 10 % EX SWAB
2.0000 | Freq: Once | CUTANEOUS | Status: AC
  Administered 2023-06-17: 2 via TOPICAL

## 2023-06-17 MED ORDER — ALUM & MAG HYDROXIDE-SIMETH 200-200-20 MG/5ML PO SUSP: 30.0000 mL | ORAL | Status: DC | PRN

## 2023-06-17 MED ORDER — PROPOFOL 10 MG/ML IV BOLUS
INTRAVENOUS | Status: DC | PRN
  Administered 2023-06-17: 100 mg via INTRAVENOUS

## 2023-06-17 MED ORDER — APIXABAN 2.5 MG PO TABS
2.5000 mg | ORAL_TABLET | Freq: Two times a day (BID) | ORAL | Status: AC
  Administered 2023-06-18 (×2): 2.5 mg via ORAL
  Filled 2023-06-17 (×2): qty 1

## 2023-06-17 MED ORDER — DIPHENHYDRAMINE HCL 12.5 MG/5ML PO ELIX
12.5000 mg | ORAL_SOLUTION | ORAL | Status: DC | PRN
  Administered 2023-06-18: 12.5 mg via ORAL
  Filled 2023-06-17: qty 5

## 2023-06-17 SURGICAL SUPPLY — 64 items
AID PSTN UNV HD RSTRNT DISP (MISCELLANEOUS) ×1
BAG COUNTER SPONGE SURGICOUNT (BAG) IMPLANT
BAG SPEC THK2 15X12 ZIP CLS (MISCELLANEOUS) ×1
BAG SPNG CNTER NS LX DISP (BAG)
BAG ZIPLOCK 12X15 (MISCELLANEOUS) ×1 IMPLANT
BASEPLATE GLENOSPHERE 25 (Plate) IMPLANT
BEARING HUMERAL SHLDER 36M STD (Shoulder) IMPLANT
BIT DRILL TWIST 2.7 (BIT) IMPLANT
BLADE SAW SGTL 73X25 THK (BLADE) ×1 IMPLANT
BRNG HUM STD 36 RVRS SHLDR (Shoulder) ×1 IMPLANT
CEMENT BONE DEPUY (Cement) IMPLANT
CLSR STERI-STRIP ANTIMIC 1/2X4 (GAUZE/BANDAGES/DRESSINGS) ×1 IMPLANT
COOLER ICEMAN CLASSIC (MISCELLANEOUS) IMPLANT
COVER BACK TABLE 60X90IN (DRAPES) ×1 IMPLANT
COVER SURGICAL LIGHT HANDLE (MISCELLANEOUS) ×1 IMPLANT
DRAPE ORTHO SPLIT 77X108 STRL (DRAPES) ×2
DRAPE SHEET LG 3/4 BI-LAMINATE (DRAPES) ×2 IMPLANT
DRAPE SURG 17X11 SM STRL (DRAPES) ×1 IMPLANT
DRAPE SURG ORHT 6 SPLT 77X108 (DRAPES) ×2 IMPLANT
DRAPE U-SHAPE 47X51 STRL (DRAPES) ×1 IMPLANT
DRSG MEPILEX POST OP 4X8 (GAUZE/BANDAGES/DRESSINGS) ×1 IMPLANT
DURAPREP 26ML APPLICATOR (WOUND CARE) ×2 IMPLANT
ELECT REM PT RETURN 15FT ADLT (MISCELLANEOUS) ×1 IMPLANT
FACESHIELD WRAPAROUND (MASK) ×1 IMPLANT
FACESHIELD WRAPAROUND OR TEAM (MASK) ×1 IMPLANT
GLENOID SPHERE STD STRL 36MM (Orthopedic Implant) IMPLANT
GLOVE BIO SURGEON STRL SZ 6.5 (GLOVE) ×1 IMPLANT
GLOVE BIOGEL PI IND STRL 7.0 (GLOVE) ×1 IMPLANT
GLOVE BIOGEL PI IND STRL 8 (GLOVE) ×1 IMPLANT
GLOVE ORTHO TXT STRL SZ7.5 (GLOVE) ×1 IMPLANT
GOWN STRL SURGICAL XL XLNG (GOWN DISPOSABLE) ×2 IMPLANT
HOOD PEEL AWAY T7 (MISCELLANEOUS) ×2 IMPLANT
INST ROD SCREW-IN VERSION (INSTRUMENTS) ×1
INSTRUMENT ROD SCREW-IN VERSIN (INSTRUMENTS) IMPLANT
KIT BASIN OR (CUSTOM PROCEDURE TRAY) ×1 IMPLANT
KIT TURNOVER KIT A (KITS) IMPLANT
PACK SHOULDER (CUSTOM PROCEDURE TRAY) ×1 IMPLANT
PIN STEINMANN THREADED TIP (PIN) IMPLANT
PIN THREADED REVERSE (PIN) IMPLANT
RESTRAINT HEAD UNIVERSAL NS (MISCELLANEOUS) ×1 IMPLANT
SCREW BONE STRL 6.5MMX25MM (Screw) IMPLANT
SCREW LOCKING 4.75MMX15MM (Screw) IMPLANT
SCREW LOCKING NS 4.75MMX20MM (Screw) IMPLANT
SCREW LOCKING STRL 4.75X25X3.5 (Screw) IMPLANT
SHOULDER HUMERAL BEAR 36M STD (Shoulder) ×1 IMPLANT
SLING ARM FOAM STRAP LRG (SOFTGOODS) IMPLANT
SLING ARM FOAM STRAP MED (SOFTGOODS) IMPLANT
SLING ARM IMMOBILIZER LRG (SOFTGOODS) ×1 IMPLANT
SMARTMIX MINI TOWER (MISCELLANEOUS)
SPIKE FLUID TRANSFER (MISCELLANEOUS) IMPLANT
SPONGE T-LAP 4X18 ~~LOC~~+RFID (SPONGE) IMPLANT
STEM HUMERAL STRL 11MMX55MM (Stem) IMPLANT
STEM HUMERAL STRL 12MMX14MM (Stem) IMPLANT
SUCTION TUBE FRAZIER 12FR DISP (SUCTIONS) ×1 IMPLANT
SUPPORT WRAP ARM LG (MISCELLANEOUS) ×1 IMPLANT
SUT MAXBRAID #2 CVD NDL (SUTURE) IMPLANT
SUT MAXBRAID #5 CCS-NDL 2PK (SUTURE) IMPLANT
SUT MNCRL AB 3-0 PS2 18 (SUTURE) IMPLANT
SUT VIC AB 1 CT1 36 (SUTURE) ×1 IMPLANT
SUT VIC AB 2-0 CT1 27 (SUTURE) ×1
SUT VIC AB 2-0 CT1 TAPERPNT 27 (SUTURE) ×1 IMPLANT
TOWEL OR 17X26 10 PK STRL BLUE (TOWEL DISPOSABLE) ×1 IMPLANT
TOWER SMARTMIX MINI (MISCELLANEOUS) IMPLANT
TRAY HUM MINI SHOULDER +3 40 (Joint) IMPLANT

## 2023-06-17 NOTE — Interval H&P Note (Signed)
History and Physical Interval Note:  06/17/2023 7:15 AM  Tricia Harrison  has presented today for surgery, with the diagnosis of djd right shoulder.  The various methods of treatment have been discussed with the patient and family. After consideration of risks, benefits and other options for treatment, the patient has consented to  Procedure(s): REVERSE SHOULDER ARTHROPLASTY (Right) as a surgical intervention.  The patient's history has been reviewed, patient examined, no change in status, stable for surgery.  I have reviewed the patient's chart and labs.  Questions were answered to the patient's satisfaction.     Eulas Post

## 2023-06-17 NOTE — Discharge Instructions (Signed)
POST-OPERATIVE INSTRUCTIONS - TOTAL SHOULDER REPLACEMENT    WOUND CARE You may leave the operative dressing in place until your follow-up appointment. KEEP THE INCISIONS CLEAN AND DRY. There may be a small amount of fluid/bleeding leaking at the surgical site. This is normal after surgery.  If it fills with liquid or blood please call us immediately to change it for you. Use the provided ice machine or Ice packs as often as possible for the first 3-4 days, then as needed for pain relief.   Keep a layer of cloth or a shirt between your skin and the cooling unit to prevent frost bite as it can get very cold.  SHOWERING: - You may shower on Post-Op Day #3.  - The dressing is water resistant but not water proof please keep it covered when showering - You may remove the sling for showering - Gently pat the area dry.  - Do not soak the shoulder in water.  - KEEP THE INCISIONS CLEAN AND DRY.  EXERCISES Wear the sling at all times (ok to weight bearing through right arm when transferring to chairs or bed or using walker) You may remove the sling for showering, but keep the arm across the chest or in a secondary sling.    Accidental/Purposeful External Rotation and shoulder flexion (reaching behind you) is to be avoided at all costs for the first month. It is ok to come out of your sling if your are sitting and have assistance for eating.   Do not lift anything heavier than 1 pound until we discuss it further in clinic. It is normal for your fingers/hand to become more swollen after surgery and discolored from bruising.   This will resolve over the first few weeks usually after surgery. Please continue to ambulate and do not stay sitting or lying for too long.  Perform foot and wrist pumps to assist in circulation.  REGIONAL ANESTHESIA (NERVE BLOCKS) The anesthesia team may have performed a nerve block for you this is a great tool used to minimize pain.   The block may start wearing off  overnight (between 8-24 hours postop) When the block wears off, your pain may go from nearly zero to the pain you would have had postop without the block. This is an abrupt transition but nothing dangerous is happening.   This can be a challenging period but utilize your as needed pain medications to try and manage this period. We suggest you use the pain medication the first night prior to going to bed, to ease this transition.  You may take an extra dose of narcotic when this happens if needed  FOLLOW-UP If you develop a Fever (>101.5), Redness or Drainage from the surgical incision site, please call our office to arrange for an evaluation. Please call the office to schedule a follow-up appointment for a wound check, 2 weeks post-operatively.  IF YOU HAVE ANY QUESTIONS, PLEASE FEEL FREE TO CALL OUR OFFICE.  HELPFUL INFORMATION  Your arm will be in a sling following surgery. You will be in this sling for the next 6 weeks.   You may be more comfortable sleeping in a semi-seated position the first few nights following surgery.  Keep a pillow propped under the elbow and forearm for comfort.  If you have a recliner type of chair it might be beneficial.  If not that is fine too, but it would be helpful to sleep propped up with pillows behind your operated shoulder as well under your elbow  and forearm.  This will reduce pulling on the suture lines.  When dressing, put your operative arm in the sleeve first.  When getting undressed, take your operative arm out last.  Loose fitting, button-down shirts are recommended.  In most states it is against the law to drive while your arm is in a sling. And certainly against the law to drive while taking narcotics.  You may return to work/school in the next couple of days when you feel up to it. Desk work and typing in the sling is fine.  We suggest you use the pain medication the first night prior to going to bed, in order to ease any pain when the  anesthesia wears off. You should avoid taking pain medications on an empty stomach as it will make you nauseous.  You should wean off your narcotic medicines as soon as you are able.     Most patients will be off or using minimal narcotics before their first postop appointment.   Do not drink alcoholic beverages or take illicit drugs when taking pain medications.  Pain medication may make you constipated.  Below are a few solutions to try in this order: Decrease the amount of pain medication if you aren't having pain. Drink lots of decaffeinated fluids. Drink prune juice and/or each dried prunes  If the first 3 don't work start with additional solutions Take Colace - an over-the-counter stool softener Take Senokot - an over-the-counter laxative Take Miralax - a stronger over-the-counter laxative   Dental Antibiotics:  In most cases prophylactic antibiotics for Dental procdeures after total joint surgery are not necessary.  Exceptions are as follows:  1. History of prior total joint infection  2. Severely immunocompromised (Organ Transplant, cancer chemotherapy, Rheumatoid biologic meds such as Humira)  3. Poorly controlled diabetes (A1C &gt; 8.0, blood glucose over 200)  If you have one of these conditions, contact your surgeon for an antibiotic prescription, prior to your dental procedure.

## 2023-06-17 NOTE — Anesthesia Procedure Notes (Signed)
Anesthesia Regional Block: Interscalene brachial plexus block   Pre-Anesthetic Checklist: , timeout performed,  Correct Patient, Correct Site, Correct Laterality,  Correct Procedure, Correct Position, site marked,  Risks and benefits discussed,  Surgical consent,  Pre-op evaluation,  At surgeon's request and post-op pain management  Laterality: Right and Upper  Prep: chloraprep       Needles:  Injection technique: Single-shot  Needle Type: Echogenic Needle     Needle Length: 9cm  Needle Gauge: 21     Additional Needles:   Procedures:,,,, ultrasound used (permanent image in chart),,    Narrative:  Start time: 06/17/2023 7:14 AM End time: 06/17/2023 7:21 AM Injection made incrementally with aspirations every 5 mL.  Performed by: Personally  Anesthesiologist: Jairo Ben, MD  Additional Notes: Pt identified in Holding room.  Monitors applied. Working IV access confirmed. Timeout, Sterile prep.  #21ga ECHOgenic Arrow block needle to interscalene brachial plexus with US guidance.  10cc 0.5% Bupivacaine, Exparel injected incrementally after negative test dose.  Patient asymptomatic, VSS, no heme aspirated, tolerated well.   Sandford Craze, MD

## 2023-06-17 NOTE — Anesthesia Procedure Notes (Addendum)
Procedure Name: Intubation Date/Time: 06/17/2023 7:49 AM  Performed by: Vanessa [place], CRNAPre-anesthesia Checklist: Emergency Drugs available, Suction available, Patient identified and Patient being monitored Patient Re-evaluated:Patient Re-evaluated prior to induction Oxygen Delivery Method: Circle system utilized Preoxygenation: Pre-oxygenation with 100% oxygen Induction Type: IV induction Ventilation: Mask ventilation without difficulty Laryngoscope Size: Glidescope and 3 Grade View: Grade I Tube type: Oral Tube size: 7.0 mm Number of attempts: 1 Airway Equipment and Method: Video-laryngoscopy Placement Confirmation: ETT inserted through vocal cords under direct vision, positive ETCO2 and breath sounds checked- equal and bilateral Secured at: 21 cm Tube secured with: Tape Dental Injury: Teeth and Oropharynx as per pre-operative assessment  Comments: Glidescope used due to very stiff neck preop and pt report of sore throat and difficulty swallowing after last intubation

## 2023-06-17 NOTE — Transfer of Care (Signed)
Immediate Anesthesia Transfer of Care Note  Patient: Sheilla Maris  Procedure(s) Performed: RIGHT REVERSE SHOULDER ARTHROPLASTY (Right: Shoulder)  Patient Location: PACU  Anesthesia Type:GA combined with regional for post-op pain  Level of Consciousness: awake and patient cooperative  Airway & Oxygen Therapy: Patient Spontanous Breathing and Patient connected to face mask  Post-op Assessment: Report given to RN and Post -op Vital signs reviewed and stable  Post vital signs: Reviewed and stable   Last Vitals:  Vitals Value Taken Time  BP 166/75 06/17/23 1011  Temp    Pulse 54 06/17/23 1013  Resp 18 06/17/23 1013  SpO2 100 % 06/17/23 1013  Vitals shown include unfiled device data.  Last Pain:  Vitals:   06/17/23 4098  TempSrc:   PainSc: 0-No pain         Complications: No notable events documented.

## 2023-06-17 NOTE — Anesthesia Postprocedure Evaluation (Signed)
Anesthesia Post Note  Patient: Tricia Harrison  Procedure(s) Performed: RIGHT REVERSE SHOULDER ARTHROPLASTY (Right: Shoulder)     Patient location during evaluation: PACU Anesthesia Type: General Level of consciousness: awake and alert, patient cooperative and oriented Pain management: pain level controlled Vital Signs Assessment: post-procedure vital signs reviewed and stable Respiratory status: spontaneous breathing, nonlabored ventilation, respiratory function stable and patient connected to nasal cannula oxygen Cardiovascular status: blood pressure returned to baseline and stable Postop Assessment: no apparent nausea or vomiting Anesthetic complications: no   No notable events documented.  Last Vitals:  Vitals:   06/17/23 1100 06/17/23 1147  BP: (!) 163/79 (!) 157/75  Pulse: (!) 56 (!) 54  Resp: 16 20  Temp: 36.4 C 36.7 C  SpO2: 94% 94%                    Makyi Ledo,E. Mylik Pro

## 2023-06-17 NOTE — Plan of Care (Signed)
Problem: Education: Goal: Knowledge of General Education information will improve Description: Including pain rating scale, medication(s)/side effects and non-pharmacologic comfort measures Outcome: Progressing   Problem: Clinical Measurements: Goal: Ability to maintain clinical measurements within normal limits will improve Outcome: Progressing   Problem: Health Behavior/Discharge Planning: Goal: Ability to manage health-related needs will improve Outcome: Progressing   Problem: Activity: Goal: Risk for activity intolerance will decrease Outcome: Progressing   Problem: Pain Managment: Goal: General experience of comfort will improve Outcome: Progressing   Haydee Salter, RN 06/17/23 3:45 PM

## 2023-06-17 NOTE — Op Note (Signed)
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  ANESTHESIA:   General with interscalene block using Exparel  ESTIMATED BLOOD LOSS: 150 mL  UNIQUE ASPECTS OF THE CASE: The shoulder was fairly tight, my initial head cut was more of an anatomic cut than the reverse cut.  I instrumented the glenoid, but things were a little bit tight on the extraction of the instruments going back to the humerus.  The 12 was fairly tight, so I countersunk and 11, and then used a calcar planer to remove more bone.  I then thought I had a good fill, the 11 was a little bit rotationally unstable, so I placed a 12 prosthesis, but ultimately that did not sit down as nicely as the broach, and it was too tight.  Therefore I opened a second stem, and placed an 11 which did have good rotational stability with the grit blast proximal coating, and the soft tissue tension felt appropriate at this point.  I wondered if the subscapularis repair was a little bit tight, but it came down to bone fairly nicely, and looked very similar to the preoperative appearance.  PREOPERATIVE INDICATIONS:  [name] is a  77 y.o. female with a diagnosis of djd right shoulder who failed conservative measures and elected for surgical management.    The risks benefits and alternatives were discussed with the patient preoperatively including but not limited to the risks of infection, bleeding, nerve injury, cardiopulmonary complications, the need for revision surgery, dislocation, brachial plexus palsy, incomplete relief of pain, among  others, and the patient was willing to proceed.  OPERATIVE IMPLANTS:   Implant Name Type Inv. Item Serial No. Manufacturer Lot No. LRB No. Used Action  BASEPLATE GLENOSPHERE 25 - [id number] Plate BASEPLATE GLENOSPHERE 25  [employer](ORTH,TRAU,BIO,SG) [id number] Right 1 Implanted  SCREW BONE STRL 6.4QVZ56LO - [id number] Screw SCREW BONE STRL 6.5MMX25MM  [employer](ORTH,TRAU,BIO,SG) [id number] Right 1 Implanted  SCREW LOCKING NS 4.75MMX20MM - [id number] Screw SCREW LOCKING NS 4.75MMX20MM  [employer](ORTH,TRAU,BIO,SG) [id number] Right 1 Implanted  SCREW LOCKING STRL 2.54Y70W2.5 - [id number] Screw SCREW LOCKING STRL 6.16W73X1.0  [employer](ORTH,TRAU,BIO,SG) [id number] Right 1 Implanted  GLENOID SPHERE STD STRL - [id number] Orthopedic Implant GLENOID SPHERE STD STRL  [employer](ORTH,TRAU,BIO,SG) [id number] Right 1 Implanted  SCREW LOCKING 4.75MMX15MM - [id number] Screw SCREW LOCKING 4.75MMX15MM  [employer](ORTH,TRAU,BIO,SG) [id number] Right 2 Implanted  TRAY HUM MINI SHOULDER +3 40 - [id number] Joint TRAY HUM MINI SHOULDER +3 40  [employer](ORTH,TRAU,BIO,SG) [id number] Right 1 Implanted  BRNG HUM STD 36 RVRS SHLDR - [id number] Shoulder BRNG HUM STD 36 RVRS SHLDR  [employer](ORTH,TRAU,BIO,SG) [id number] Right 1 Implanted  STEM HUMERAL STRL 11MMX55MM - [id number] Stem STEM HUMERAL STRL 11MMX55MM  [employer](ORTH,TRAU,BIO,SG) [id number] Right 1 Implanted    OPERATIVE FINDINGS: Massive rotator cuff tear with retraction of supraspinatus and infraspinatus as well as degenerative changes in the humeral head and to a lesser degree the glenoid.  OPERATIVE PROCEDURE: The patient was brought to the operating room and placed in the supine position. General anesthesia was administered. IV antibiotics were given.  Time out was performed. The upper extremity was prepped and draped  in usual sterile fashion. The patient was in a beachchair position. Deltopectoral approach was carried out. The biceps was  tenodesed to the pectoralis tendon with #2 Maxbraid. The subscapularis was released off of the bone.   I then performed circumferential releases of the humerus, and then dislocated the head, and then reamed with the reamer to the above named size.  I then applied the jig, and cut the humeral head in 30 of retroversion, and then turned my attention to the glenoid.  Deep retractors were placed, and I resected the labrum, and then placed a guidepin into the center position on the glenoid, with slight inferior inclination. I then reamed over the guidepin, and this created a small metaphyseal cancellus blush inferiorly, removing just the cartilage to the subchondral bone superiorly. The base plate was selected and impacted place, and then I secured it centrally with a nonlocking screw, and I had excellent purchase both inferiorly and superiorly. I placed a short locking screws on anterior and posterior aspects.  I then turned my attention to the glenosphere, and impacted this into place, placing slight inferior offset (set on B).   The glenosphere was completely seated, and had engagement of the [facility] taper. I then turned my attention back to the humerus.  I sequentially broached, and then trialed, and was found to restore soft tissue tension, and it had 2 finger tightness. Therefore the above named components were selected. The shoulder felt stable throughout functional motion.  I tried initially to use a 12 stem, but it was too tight and too tall, so I went back and opened a second stem, size 11, which sat down nicely, and restored soft tissue tension and felt stable in the humerus.  Before I placed the real prosthesis I had also placed a total of 1 #2 and 2 #5 Maxbraid through the humerus for later subscapularis repair.  I then impacted the real prosthesis into place, as well as the real humeral tray, and reduced the shoulder. The shoulder had excellent motion, and was stable, and I irrigated the  wounds copiously.    I then used the Maxbraid suture to repair the subscapularis. This came down to bone.  I then irrigated the shoulder copiously once more, repaired the deltopectoral interval with Vicryl followed by subcutaneous Vicryl with Steri-Strips and sterile gauze for the skin. The patient was awakened and returned back in stable and satisfactory condition. There were no complications and She tolerated the procedure well.

## 2023-06-18 ENCOUNTER — Encounter (HOSPITAL_COMMUNITY): Payer: Self-pay | Admitting: Orthopedic Surgery

## 2023-06-18 DIAGNOSIS — M12811 Other specific arthropathies, not elsewhere classified, right shoulder: Secondary | ICD-10-CM | POA: Diagnosis not present

## 2023-06-18 LAB — BASIC METABOLIC PANEL
Anion gap: 8 (ref 5–15)
BUN: 30 mg/dL — ABNORMAL HIGH (ref 8–23)
CO2: 21 mmol/L — ABNORMAL LOW (ref 22–32)
Calcium: 8.3 mg/dL — ABNORMAL LOW (ref 8.9–10.3)
Chloride: 108 mmol/L (ref 98–111)
Creatinine, Ser: 1.53 mg/dL — ABNORMAL HIGH (ref 0.44–1.00)
GFR, Estimated: 35 mL/min — ABNORMAL LOW (ref >60)
Glucose, Bld: 139 mg/dL — ABNORMAL HIGH (ref 70–99)
Potassium: 4 mmol/L (ref 3.5–5.1)
Sodium: 137 mmol/L (ref 135–145)

## 2023-06-18 LAB — CBC
HCT: 26.7 % — ABNORMAL LOW (ref 36.0–46.0)
Hemoglobin: 8.4 g/dL — ABNORMAL LOW (ref 12.0–15.0)
MCH: 31.3 pg (ref 26.0–34.0)
MCHC: 31.5 g/dL (ref 30.0–36.0)
MCV: 99.6 fL (ref 80.0–100.0)
Platelets: 234 10*3/uL (ref 150–400)
RBC: 2.68 MIL/uL — ABNORMAL LOW (ref 3.87–5.11)
RDW: 13.9 % (ref 11.5–15.5)
WBC: 11.1 10*3/uL — ABNORMAL HIGH (ref 4.0–10.5)
nRBC: 0 % (ref 0.0–0.2)

## 2023-06-18 MED ORDER — HYDROCODONE-ACETAMINOPHEN 5-325 MG PO TABS
1.0000 | ORAL_TABLET | ORAL | Status: DC | PRN
  Filled 2023-06-18: qty 1

## 2023-06-18 NOTE — NC FL2 (Signed)
[place] MEDICAID FL2 LEVEL OF CARE FORM     IDENTIFICATION  Patient Name: Tricia Harrison Birthdate: Nov 23, 1946 Sex: female Admission Date (Current Location): 06/17/2023  [facility] and [place] Number:  Producer, television/film/video and Address:  [facility],  [address] [place], [place] [address]      Provider Number: [id number]  Attending Physician Name and Address:  Teryl , MD  Relative Name and Phone Number:  daughter-in-law, Dorthey Sawyer @ [phone]    Current Level of Care: Hospital Recommended Level of Care: Skilled Nursing Facility Prior Approval Number:    Date Approved/Denied:   PASRR Number: [id number]  Discharge Plan: SNF    Current Diagnoses: Patient Active Problem List   Diagnosis Date Noted   S/P reverse total shoulder arthroplasty, 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   Anemia 06/16/2023   Uses wheelchair 01/14/2022   Velopharyngeal dysfunction with mild swallowing difficulty 01/14/2022   Uses roller walker 01/11/2022   Atherosclerosis of aorta (HCC) 01/11/2022   Chronic kidney disease, stage 3b (HCC) 01/11/2022   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   Orthostasis 01/11/2022   Leukocytosis 01/11/2022   Chronic cholecystitis 01/10/2022   Obstructive sleep apnea 03/09/2019   Aortic regurgitation 12/02/2018   History of pulmonary embolism 08/20/2018   DOE (dyspnea on exertion)    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   Bilateral ovarian cysts    Hx of gallstones    Hernia, hiatal    Renal cysts, acquired, bilateral    GERD (gastroesophageal reflux disease)    Diverticulosis    Lumbar spinal stenosis    Diabetes mellitus without complication (HCC)    Left knee DJD    Rapid palpitations 12/06/2013   Morbid obesity (HCC) 12/06/2013   Fatigue 12/06/2013   PVC (premature ventricular contraction) 12/06/2013   Essential hypertension    Hypothyroidism    Hyperlipidemia LDL goal <100     Orientation RESPIRATION BLADDER Height &  Weight     Self, Time, Situation, Place  Normal Incontinent, Indwelling catheter Weight: 155 lb (70.3 kg) Height:  5\' 1"  (154.9 cm)  BEHAVIORAL SYMPTOMS/MOOD NEUROLOGICAL BOWEL NUTRITION STATUS      Continent Diet (regular)  AMBULATORY STATUS COMMUNICATION OF NEEDS Skin   Extensive Assist Verbally Other (Comment) (surgical incision only)                       Personal Care Assistance Level of Assistance  Bathing, Dressing, Feeding Bathing Assistance: Limited assistance Feeding assistance: Limited assistance Dressing Assistance: Limited assistance     Functional Limitations Info  Sight, Hearing, Speech Sight Info: Adequate Hearing Info: Adequate Speech Info: Adequate    SPECIAL CARE FACTORS FREQUENCY  PT (By licensed PT), OT (By licensed OT)     PT Frequency: 5x/wk OT Frequency: 5x/wk            Contractures Contractures Info: Not present    Additional Factors Info  Code Status, Allergies Code Status Info: Full Allergies Info: Atorvastatin, Codeine, Morphine And Codeine           Current Medications (06/18/2023):  This is the current hospital active medication list Current Facility-Administered Medications  Medication Dose Route Frequency Provider Last Rate Last Admin   0.9 % NaCl with KCl 20 mEq/ L  infusion   Intravenous Continuous Armida Sans, PA-C 75 mL/hr at 06/18/23 0208 New Bag at 06/18/23 0208   acetaminophen (TYLENOL) tablet 325-650 mg  325-650 mg Oral Q6H PRN Armida Sans, PA-C  albuterol (PROVENTIL) (2.5 MG/3ML) 0.083% nebulizer solution 3 mL  3 mL Inhalation Q6H PRN Janine Ores K, PA-C       alum & mag hydroxide-simeth (MAALOX/MYLANTA) 200-200-20 MG/5ML suspension 30 mL  30 mL Oral Q4H PRN Janine Ores K, PA-C       apixaban Everlene Balls) tablet 2.5 mg  2.5 mg Oral BID Janine Ores K, PA-C   2.5 mg at 06/18/23 0955   [START ON 06/19/2023] apixaban (ELIQUIS) tablet 5 mg  5 mg Oral BID Janine Ores K, PA-C       bisacodyl (DULCOLAX)  suppository 10 mg  10 mg Rectal Daily PRN Manson Passey, Blaine K, PA-C       carvedilol (COREG) tablet 3.125 mg  3.125 mg Oral Q supper Brown, Blaine K, PA-C   3.125 mg at 06/17/23 1842   carvedilol (COREG) tablet 6.25 mg  6.25 mg Oral q AM Janine Ores K, PA-C   6.25 mg at 06/18/23 1610   diclofenac Sodium (VOLTAREN) 1 % topical gel 2 g  2 g Topical Daily PRN Janine Ores K, PA-C       diltiazem (CARDIZEM CD) 24 hr capsule 180 mg  180 mg Oral Daily Armida Sans, PA-C   180 mg at 06/18/23 0954   diphenhydrAMINE (BENADRYL) 12.5 MG/5ML elixir 12.5-25 mg  12.5-25 mg Oral Q4H PRN Janine Ores K, PA-C   12.5 mg at 06/18/23 0209   docusate sodium (COLACE) capsule 100 mg  100 mg Oral BID Armida Sans, PA-C   100 mg at 06/18/23 0957   fenofibrate tablet 54 mg  54 mg Oral Daily Armida Sans, PA-C   54 mg at 06/18/23 0956   ketorolac (ACULAR) 0.5 % ophthalmic solution 1 drop  1 drop Both Eyes TID Janine Ores K, PA-C       latanoprost (XALATAN) 0.005 % ophthalmic solution 1 drop  1 drop Both Eyes QHS Brown, Blaine K, PA-C       levothyroxine (SYNTHROID) tablet 50 mcg  50 mcg Oral QAC breakfast Armida Sans, PA-C   50 mcg at 06/18/23 9604   magnesium citrate solution 1 Bottle  1 Bottle Oral Once PRN Janine Ores K, PA-C       menthol-cetylpyridinium (CEPACOL) lozenge 3 mg  1 lozenge Oral PRN Janine Ores K, PA-C       Or   phenol (CHLORASEPTIC) mouth spray 1 spray  1 spray Mouth/Throat PRN Janine Ores K, PA-C   1 spray at 06/17/23 1152   metoCLOPramide (REGLAN) tablet 5-10 mg  5-10 mg Oral Q8H PRN Janine Ores K, PA-C       Or   metoCLOPramide (REGLAN) injection 5-10 mg  5-10 mg Intravenous Q8H PRN Manson Passey, Blaine K, PA-C       ondansetron [facility] tablet 4 mg  4 mg Oral Q6H PRN Janine Ores K, PA-C       Or   ondansetron [facility]) injection 4 mg  4 mg Intravenous Q6H PRN Manson Passey, Blaine K, PA-C       pantoprazole (PROTONIX) EC tablet 40 mg  40 mg Oral Daily Janine Ores K, PA-C   40 mg at  06/18/23 0954   polyethylene glycol (MIRALAX / GLYCOLAX) packet 17 g  17 g Oral Daily PRN Janine Ores K, PA-C       rosuvastatin (CRESTOR) tablet 20 mg  20 mg Oral Daily Janine Ores K, PA-C   20 mg at 06/17/23 1840   traMADol (ULTRAM) tablet 50 mg  50 mg  Oral Q4H PRN Armida Sans, PA-C   50 mg at 06/18/23 4098     Discharge Medications: Please see discharge summary for a list of discharge medications.  Relevant Imaging Results:  Relevant Lab Results:   Additional Information SSN: [id number]  Amada Jupiter, LCSW

## 2023-06-18 NOTE — TOC Initial Note (Signed)
Transition of Care [facility]) - Initial/Assessment Note    Patient Details  Name: Tricia Harrison MRN: [id number] Date of Birth: [birth year]  [facility] CM/SW Contact:    Amada Jupiter, LCSW Phone Number: 06/18/2023, 1:46 PM  Clinical Narrative:                  Met with pt, son and daughter-in-law today to review dc planning needs.  All are concerned about pt's current assistance needs as they note they cannot provide 24/7 assistance.  Per PT evaluation, PT has recommended SNF rehab and pt/ family are fully agreed with this plan.  Have alerted MD/PA and TOC orders placed to assist with SNF.  Will begin bed search process.       Expected Discharge Plan: Skilled Nursing Facility Barriers to Discharge: Continued Medical Work up, English as a second language teacher, SNF Pending bed offer   Patient Goals and CMS Choice Patient states their goals for this hospitalization and ongoing recovery are:: return home after SNF rehab stay          Expected Discharge Plan and Services In-house Referral: Clinical Social Work   Post Acute Care Choice: Skilled Nursing Facility Living arrangements for the past 2 months: Single Family Home                 DME Arranged: N/A DME Agency: NA                  Prior Living Arrangements/Services Living arrangements for the past 2 months: Single Family Home   Patient language and need for interpreter reviewed:: Yes Do you feel safe going back to the place where you live?: Yes      Need for Family Participation in Patient Care: Yes (Comment) Care giver support system in place?: No (comment)   Criminal Activity/Legal Involvement Pertinent to Current Situation/Hospitalization: No - Comment as needed  Activities of Daily Living Home Assistive Devices/Equipment: CBG Meter, Walker (specify type), Cane (specify quad or straight), Blood pressure cuff, Hospital bed, Shower chair with back (rolling walker with seat) ADL Screening (condition at  time of admission) Patient's cognitive ability adequate to safely complete daily activities?: Yes Is the patient deaf or have difficulty hearing?: No Does the patient have difficulty seeing, even when wearing glasses/contacts?: Yes Does the patient have difficulty concentrating, remembering, or making decisions?: Yes Patient able to express need for assistance with ADLs?: Yes Does the patient have difficulty dressing or bathing?: Yes Independently performs ADLs?: No Communication: Independent Dressing (OT): Needs assistance Is this a change from baseline?: Pre-admission baseline Grooming: Independent Feeding: Independent Bathing: Needs assistance Is this a change from baseline?: Pre-admission baseline Toileting: Independent with device (comment) (walker) In/Out Bed: Independent with device (comment) (walker) Walks in Home: Independent with device (comment) (walker) Does the patient have difficulty walking or climbing stairs?: Yes Weakness of Legs: Both Weakness of Arms/Hands: Both  Permission Sought/Granted Permission sought to share information with : Family Supports, Magazine features editor Permission granted to share information with : Yes, Verbal Permission Granted  Share Information with NAME: daughter-in-law, Arcelia Jew @ [phone]           Emotional Assessment Appearance:: Appears stated age Attitude/Demeanor/Rapport: Gracious, Engaged Affect (typically observed): Accepting Orientation: : Oriented to Self, Oriented to Place, Oriented to  Time, Oriented to Situation Alcohol / Substance Use: Not Applicable Psych Involvement: No (comment)  Admission diagnosis:  S/P reverse total shoulder arthroplasty, right [Z96.611] Patient Active Problem List   Diagnosis Date Noted  S/P reverse total shoulder arthroplasty, 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   Anemia 06/16/2023   Uses wheelchair 01/14/2022   Velopharyngeal dysfunction with mild swallowing difficulty 01/14/2022   Uses  roller walker 01/11/2022   Atherosclerosis of aorta (HCC) 01/11/2022   Chronic kidney disease, stage 3b (HCC) 01/11/2022   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   Orthostasis 01/11/2022   Leukocytosis 01/11/2022   Chronic cholecystitis 01/10/2022   Obstructive sleep apnea 03/09/2019   Aortic regurgitation 12/02/2018   History of pulmonary embolism 08/20/2018   DOE (dyspnea on exertion)    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   Bilateral ovarian cysts    Hx of gallstones    Hernia, hiatal    Renal cysts, acquired, bilateral    GERD (gastroesophageal reflux disease)    Diverticulosis    Lumbar spinal stenosis    Diabetes mellitus without complication (HCC)    Left knee DJD    Rapid palpitations 12/06/2013   Morbid obesity (HCC) 12/06/2013   Fatigue 12/06/2013   PVC (premature ventricular contraction) 12/06/2013   Essential hypertension    Hypothyroidism    Hyperlipidemia LDL goal <100    PCP:  Darrow Bussing, MD Pharmacy:   [id number] - [place], [place] - [address] [place] [address] Phone: [phone] Fax: [phone]  [facility]) - [place], [place] - [address] [facility] [address] [place] [address] [place] [address] Phone: [phone] Fax: [phone]  [facility] - [place], [place] - [address] [place] [address] Phone: [phone] Fax: [phone]     Social Determinants of Health (SDOH) Social History: SDOH Screenings   Food Insecurity: No Food Insecurity (06/17/2023)  Housing: Low Risk  (06/17/2023)  Transportation Needs: No Transportation Needs (06/17/2023)  Utilities: Not At Risk (06/17/2023)  Depression (PHQ2-9): Low Risk  (11/09/2018)  Tobacco Use: Medium Risk (06/17/2023)   SDOH Interventions:     Readmission Risk Interventions     No data to display

## 2023-06-18 NOTE — Progress Notes (Signed)
The patient stated that her pain was 9/10 and wanted pain medication. I advised her that Tramadol was the only pain medication listed in the [facility]. Patient and family member requested Vicodin. I messaged the MD who reordered Vicodin, per patient/family request. PA stated that the vicodin would be reordered, but patient's family had concerns last night that the vicodin caused the patient to be confused. I went to give the medication, family declined again. The granddaughter stated that the patient's son would be back here in about 45 mins to make the decision about the pain medication to give to his mom.Unable to return medicine due to bar code being unreadable....wasted the medication instead w/a second verifier.

## 2023-06-18 NOTE — Progress Notes (Signed)
The patient was slightly confused overnight. The patient's son, Charolotte Eke, called and wanted to discuss her confusion. I explained that sometimes narcotics can confuse the patient, and also I explained that patients can sometimes become confused from staying in the hospital. I told him that we could not give Vicodin to the patient and he agreed. I also informed him that Ultram was ordered but the patient refused, saying it had made her nauseated in the past. The patient's son wishes to speak with a provider today.

## 2023-06-18 NOTE — Progress Notes (Signed)
Physical Therapy Treatment Patient Details Name: Tricia Harrison MRN: [id number] DOB: 1946-07-12 Today's Date: 06/18/2023   History of Present Illness 78 yo female S/P R reverse TSA on 06/17/23.  PMH: DVT,CAD,CKD, HTN, LTKA, DM, PE    PT Comments  The patient was assisted in transfer to  bed using STEDY lift equipment. Patient and family amenable to  looking into  short term rehab. Continue PT.     Assistance Recommended at Discharge Frequent or constant Supervision/Assistance  If plan is discharge home, recommend the following:  Can travel by private vehicle    Two people to help with walking and/or transfers;A lot of help with bathing/dressing/bathroom;Assistance with cooking/housework;Assist for transportation;Help with stairs or ramp for entrance   No  Equipment Recommendations  None recommended by PT    Recommendations for Other Services       Precautions / Restrictions Precautions Precautions: Fall Precaution Comments: no, flexion/ABD right shoulder, may place hand on rollator Restrictions Weight Bearing Restrictions: Yes RUE Weight Bearing: Non weight bearing     Mobility  Bed Mobility Overal bed mobility: Needs Assistance Bed Mobility: Sit to Supine    Sit to supine: Total assist, +2 for safety/equipment, +2 for physical assistance   General bed mobility comments: assist with legs and trunk    Transfers Overall transfer level: Needs assistance Equipment used: Rollator (4 wheels) Transfers: Sit to/from Stand, Bed to chair/wheelchair/BSC Sit to Stand: +2 physical assistance, +2 safety/equipment, From elevated surface, Mod assist           General transfer comment: mod support to rise from recliner in STEDY, stood again in stedy at the bed. Transfer via Lift Equipment: Stedy  Ambulation/Gait                   Stairs             Wheelchair Mobility     Tilt Bed    Modified Rankin (Stroke Patients Only)       Balance  Overall balance assessment: Needs assistance, History of Falls Sitting-balance support: Single extremity supported, Feet supported Sitting balance-Leahy Scale: Poor Sitting balance - Comments: listing to right cues for NWB on RUE   Standing balance support: Bilateral upper extremity supported, During functional activity Standing balance-Leahy Scale: Poor                              Cognition Arousal/Alertness: Awake/alert Behavior During Therapy: WFL for tasks assessed/performed Overall Cognitive Status: Within Functional Limits for tasks assessed                                 General Comments: patient not acknowledging deficits now with out RUE functional use        Exercises      General Comments        Pertinent Vitals/Pain Pain Assessment Pain Assessment: 0-10 Pain Score: 6  Pain Location: right shoulder Pain Descriptors / Indicators: Grimacing, Guarding Pain Intervention(s): Limited activity within patient's tolerance, Patient requesting pain meds-RN notified, Ice applied    Home Living Family/patient expects to be discharged to:: Private residence Living Arrangements: Other relatives;Alone Available Help at Discharge: Family;Friend(s);Home health Type of Home: House Home Access: Stairs to enter   Entrance Stairs-Number of Steps: 1   Home Layout: Two level;Able to live on main level with bedroom/bathroom Home Equipment: Rollator (4 wheels);Shower seat;Grab bars -  tub/shower Additional Comments: sister lives upstairs and cannot assit. has caregivers 8:30-900PM, alone at night.    Prior Function            PT Goals (current goals can now be found in the care plan section) Acute Rehab PT Goals Patient Stated Goal: to return home PT Goal Formulation: With patient/family Time For Goal Achievement: 07/02/23 Potential to Achieve Goals: Fair Progress towards PT goals: Progressing toward goals    Frequency    Min 1X/week       PT Plan      Co-evaluation PT/OT/SLP Co-Evaluation/Treatment: Yes Reason for Co-Treatment: Complexity of the patient's impairments (multi-system involvement);For patient/therapist safety;To address functional/ADL transfers PT goals addressed during session: Mobility/safety with mobility;Strengthening/ROM OT goals addressed during session: ADL's and self-care;Strengthening/ROM      AM-PAC PT "6 Clicks" Mobility   Outcome Measure  Help needed turning from your back to your side while in a flat bed without using bedrails?: Total Help needed moving from lying on your back to sitting on the side of a flat bed without using bedrails?: Total Help needed moving to and from a bed to a chair (including a wheelchair)?: Total Help needed standing up from a chair using your arms (e.g., wheelchair or bedside chair)?: Total Help needed to walk in hospital room?: Total Help needed climbing 3-5 steps with a railing? : Total 6 Click Score: 6    End of Session Equipment Utilized During Treatment: Gait belt Activity Tolerance: Patient tolerated treatment well Patient left: in bed;with call bell/phone within reach;with bed alarm set;with family/visitor present Nurse Communication: Mobility status;Need for lift equipment PT Visit Diagnosis: Unsteadiness on feet (R26.81);Difficulty in walking, not elsewhere classified (R26.2);Repeated falls (R29.6)     Time: 3244-0102 PT Time Calculation (min) (ACUTE ONLY): 24 min  Charges:    $Therapeutic Activity: 23-37 mins PT General Charges $$ ACUTE PT VISIT: 1 Visit                     Blanchard Kelch PT Acute Rehabilitation Services Office [phone] Weekend pager-[phone]    Rada Hay 06/18/2023, 1:10 PM

## 2023-06-18 NOTE — Evaluation (Signed)
Physical Therapy Evaluation Patient Details Name: Tricia Harrison MRN: [id number] DOB: 1946/02/16 Today's Date: 06/18/2023  History of Present Illness  77 yo female S/P R reverse TSA on 06/17/23.  PMH: DVT,CAD,CKD, HTN, LTKA, DM, PE  Clinical Impression  Pt admitted with above diagnosis.  Pt currently with functional limitations due to the deficits listed below (see PT Problem List). Pt will benefit from acute skilled PT to increase their independence and safety with mobility to allow discharge.     The patient reports ambulates with rollator(in room) while leaning forward. Patient is alone at night. Family requesting info on [facility], deferred to MSW.  Patient currently limited in RUE use/no WB , therefore limiting  patient's mobility.  Patient required +2 max assistance for bed  mobility and to stand, unable to take a step so lift equipment used for safe transfer to recliner.  Patient will benefit from continued inpatient follow up therapy, <3 hours/day. Family present and aware of trnanfer/mobility hardship at this time.       Assistance Recommended at Discharge Frequent or constant Supervision/Assistance  If plan is discharge home, recommend the following:  Can travel by private vehicle  Two people to help with walking and/or transfers;A lot of help with bathing/dressing/bathroom;Assistance with cooking/housework;Assist for transportation;Help with stairs or ramp for entrance   No    Equipment Recommendations None recommended by PT  Recommendations for Other Services       Functional Status Assessment Patient has had a recent decline in their functional status and demonstrates the ability to make significant improvements in function in a reasonable and predictable amount of time.     Precautions / Restrictions Precautions Precautions: Fall Precaution Comments: no, flexion/ABD right shoulder, may place hand on rollator Restrictions RUE Weight Bearing: Non weight bearing       Mobility  Bed Mobility Overal bed mobility: Needs Assistance Bed Mobility: Supine to Sit     Supine to sit: Max assist, +2 for physical assistance, +2 for safety/equipment, HOB elevated     General bed mobility comments: assist with legs and trunk    Transfers Overall transfer level: Needs assistance Equipment used: Rollator (4 wheels) Transfers: Sit to/from Stand, Bed to chair/wheelchair/BSC Sit to Stand: Max assist, +2 physical assistance, +2 safety/equipment, From elevated surface           General transfer comment: patient stood a 2 with max support and bed pad to stand, unable to safely step using rollator, did place right UE onto gip. Corene Cornea used  for transfer to recliner, patient able to stand in device x 2 Transfer via Lift Equipment: Stedy  Ambulation/Gait                  Stairs            Wheelchair Mobility     Tilt Bed    Modified Rankin (Stroke Patients Only)       Balance Overall balance assessment: Needs assistance, History of Falls Sitting-balance support: Single extremity supported, Feet supported Sitting balance-Leahy Scale: Poor Sitting balance - Comments: listing to right cues for NWB on RUE   Standing balance support: Bilateral upper extremity supported, During functional activity Standing balance-Leahy Scale: Poor                               Pertinent Vitals/Pain Pain Assessment Pain Assessment: 0-10 Pain Score: 6  Pain Location: right shoulder Pain Descriptors / Indicators: Grimacing,  Guarding Pain Intervention(s): Limited activity within patient's tolerance, Monitored during session, Repositioned    Home Living Family/patient expects to be discharged to:: Private residence Living Arrangements: Other relatives;Alone Available Help at Discharge: Family;Friend(s);Home health Type of Home: House Home Access: Stairs to enter   Entrance Stairs-Number of Steps: 1   Home Layout: Two level;Able  to live on main level with bedroom/bathroom Home Equipment: Rollator (4 wheels);Shower seat;Grab bars - tub/shower Additional Comments: sister lives upstairs and cannot assit. has caregivers 8:30-900PM, alone at night.    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    Cervical / Trunk Assessment Cervical / Trunk Assessment: Kyphotic;Other exceptions Cervical / Trunk Exceptions: scoliotic  Communication   Communication: No difficulties  Cognition Arousal/Alertness: Awake/alert Behavior During Therapy: WFL for tasks assessed/performed Overall Cognitive Status: Difficult to assess                                 General Comments: patient not acknowledging deficits now with out RUE functional use        General Comments      Exercises     Assessment/Plan    PT Assessment Patient needs continued PT services  PT Problem List Decreased strength;Decreased activity tolerance;Decreased mobility;Decreased safety awareness;Decreased range of motion;Decreased balance;Decreased knowledge of precautions;Pain       PT Treatment Interventions DME instruction;Therapeutic activities;Cognitive remediation;Gait training;Functional mobility training;Therapeutic exercise;Patient/family education    PT Goals (Current goals can be found in the Care Plan section)  Acute Rehab PT Goals Patient Stated Goal: to return home PT Goal Formulation: With patient/family Time For Goal Achievement: 07/02/23 Potential to Achieve Goals: Fair    Frequency Min 1X/week     Co-evaluation PT/OT/SLP Co-Evaluation/Treatment: Yes Reason for  Co-Treatment: Complexity of the patient's impairments (multi-system involvement);For patient/therapist safety;To address functional/ADL transfers PT goals addressed during session: Mobility/safety with mobility;Strengthening/ROM OT goals addressed during session: ADL's and self-care;Strengthening/ROM       AM-PAC PT "6 Clicks" Mobility  Outcome Measure Help needed turning from your back to your side while in a flat bed without using bedrails?: Total Help needed moving from lying on your back to sitting on the side of a flat bed without using bedrails?: Total Help needed moving to and from a bed to a chair (including a wheelchair)?: Total Help needed standing up from a chair using your arms (e.g., wheelchair or bedside chair)?: Total Help needed to walk in hospital room?: Total Help needed climbing 3-5 steps with a railing? : Total 6 Click Score: 6    End of Session Equipment Utilized During Treatment: Gait belt Activity Tolerance: Patient tolerated treatment well Patient left: in chair;with call bell/phone within reach;with nursing/sitter in room;with chair alarm set Nurse Communication: Mobility status;Need for lift equipment PT Visit Diagnosis: Unsteadiness on feet (R26.81);Difficulty in walking, not elsewhere classified (R26.2);Repeated falls (R29.6)    Time: 7829-5621 PT Time Calculation (min) (ACUTE ONLY): 52 min   Charges:   PT Evaluation $PT Eval Moderate Complexity: 1 Mod   PT General Charges $$ ACUTE PT VISIT:  1 Visit         Blanchard Kelch PT Acute Rehabilitation Services Office [phone] Weekend pager-[phone]   Rada Hay 06/18/2023, 1:01 PM

## 2023-06-18 NOTE — Care Management Obs Status (Signed)
MEDICARE OBSERVATION STATUS NOTIFICATION   Patient Details  Name: Tricia Harrison MRN: [id number] Date of Birth: 18-Sep-1946   Medicare Observation Status Notification Given:  Yes    Amada Jupiter, LCSW 06/18/2023, 11:04 AM

## 2023-06-18 NOTE — Evaluation (Addendum)
Occupational Therapy Evaluation Patient Details Name: Tricia Harrison MRN: [id number] DOB: 21-Dec-1945 Today's Date: 06/18/2023   History of Present Illness Patient is a 77 year old female S/P R reverse TSA on 06/17/23.  PMH: DVT,CAD,CKD, HTN, LTKA, DM, PE   Clinical Impression   Patient is a 77 year old female who was admitted for above. Patient was living at home with caregiver support from 8-12pm daily with sister living in house as well with PRN support from family. Patient currently is +2 for bed mobility and transfers with patient unable to lift BLE to advance to recliner or commode. Patient originally had recommendation for NWB and no ROM of R shoulder per orders with Dr.Landau on phone with family during second approach to clarify that patient can use her RUE on rollator for transfer and mobility. Patient with strong lean to R side sitting EOB, decreased standing balance, decreased functional activity tolerance, poor postural positioning at baseline with increased falls at home prior to admission. Patient would continue to benefit from skilled OT services at this time while admitted and after d/c to address noted deficits in order to improve overall safety and independence in ADLs. Patient will benefit from continued inpatient follow up therapy, <3 hours/day      Recommendations for follow up therapy are one component of a multi-disciplinary discharge planning process, led by the attending physician.  Recommendations may be updated based on patient status, additional functional criteria and insurance authorization.   Assistance Recommended at Discharge Frequent or constant Supervision/Assistance  Patient can return home with the following Two people to help with walking and/or transfers;Assistance with cooking/housework;Direct supervision/assist for medications management;Assist for transportation;Help with stairs or ramp for entrance;Direct supervision/assist for financial management;Two  people to help with bathing/dressing/bathroom    Functional Status Assessment  Patient has had a recent decline in their functional status and/or demonstrates limited ability to make significant improvements in function in a reasonable and predictable amount of time  Equipment Recommendations  None recommended by OT       Precautions / Restrictions Precautions Precautions: Fall;Shoulder Type of Shoulder Precautions: no ROM shoulder, OK for hand wrist and elbow, per phone call on 7/24 with Dr.Landau patient is ok to use RUE on rollator for transfers and functional mobility. Shoulder Interventions: Shoulder sling/immobilizer;At all times;Off for dressing/bathing/exercises Precaution Booklet Issued: Yes (comment) Precaution Comments: no, flexion/ABD right shoulder, may place hand on rollator Restrictions Weight Bearing Restrictions: Yes RUE Weight Bearing: Non weight bearing      Mobility Bed Mobility Overal bed mobility: Needs Assistance Bed Mobility: Sit to Supine     Supine to sit: Max assist, +2 for physical assistance, +2 for safety/equipment, HOB elevated     General bed mobility comments: assist with legs and trunk        Balance Overall balance assessment: Needs assistance, History of Falls Sitting-balance support: Single extremity supported, Feet supported Sitting balance-Leahy Scale: Poor Sitting balance - Comments: listing to right cues for NWB on RUE Postural control: Right lateral lean Standing balance support: Bilateral upper extremity supported, During functional activity Standing balance-Leahy Scale: Zero       ADL either performed or assessed with clinical judgement   ADL Overall ADL's : Needs assistance/impaired Eating/Feeding: Minimal assistance;Sitting   Grooming: Maximal assistance;Sitting   Upper Body Bathing: Sitting;Total assistance Upper Body Bathing Details (indicate cue type and reason): patient unable to maintain sitting upright posture on  EOB with lateral lean to R side with physical A min to mod A  to maintain balance in midline. patient had no awareness of leaning to R. family in room reporting that patient usually leans to the L side. Lower Body Bathing: Total assistance;Bed level   Upper Body Dressing : Sitting;Total assistance Upper Body Dressing Details (indicate cue type and reason): patient and family were educated on proper sling positioning and need for pillow placement to suport RUE. Lower Body Dressing: Bed level;Total assistance   Toilet Transfer: +2 for physical assistance;+2 for safety/equipment Toilet Transfer Details (indicate cue type and reason): patient was +2 to stand at EOB with patient having flexed knees at all times and posterior pushing. patients family present reporting that patient has flexed trunk and knees at baseline wtih transfers and BLE are known to buckle on her. patient was able to transfer to recliner in room with STEDY with increased time and A to maintain ROM restrictions. Toileting- Clothing Manipulation and Hygiene: Total assistance;Sit to/from stand         General ADL Comments: patients family asked about ordering [facility] multiple times during session with patient's family educated that [facility] would not change recommendation for 24/7 caregiver support in next level of care.      Pertinent Vitals/Pain Pain Assessment Pain Assessment: 0-10 Pain Score: 6  Pain Location: right shoulder Pain Descriptors / Indicators: Grimacing, Guarding Pain Intervention(s): Limited activity within patient's tolerance, Monitored during session, Premedicated before session, Ice applied     Hand Dominance Right   Extremity/Trunk Assessment Upper Extremity Assessment Upper Extremity Assessment: LUE deficits/detail;RUE deficits/detail RUE Deficits / Details: no ROM to shoulder per MD orders. 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 patient had various family members in room during session as well who were able to help redirect patient at times.                Home Living Family/patient expects to be discharged to:: Private residence Living Arrangements: Other relatives;Alone (sister lives upstairs) Available Help at Discharge: Family;Friend(s);Home health Type of Home: House Home Access: Stairs to enter Entrance Stairs-Number of Steps: 1   Home Layout: Two level;Able to live on main level with bedroom/bathroom     Bathroom Shower/Tub: Walk-in shower;Tub/shower unit   Bathroom Toilet: Standard     Home Equipment: Rollator (4 wheels);Shower seat;Grab bars - tub/shower   Additional Comments: sister lives upstairs and cannot assit. has caregivers daily 8:30-900PM, alone at night.      Prior Functioning/Environment Prior Level of Function : Needs assist             Mobility Comments: uses rollator at baseline ADLs Comments: patient reported that caregiver physically assists with everything at home. patient reported " i risk it" when asked how she uses the bathroom when caregiver is not present. patietn reported that bathroom is where she has most of her falls.  OT Problem List: Decreased activity tolerance;Impaired balance  (sitting and/or standing);Decreased coordination;Decreased safety awareness;Decreased knowledge of precautions;Decreased knowledge of use of DME or AE;Pain      OT Treatment/Interventions: Energy conservation;Self-care/ADL training;Therapeutic exercise;DME and/or AE instruction;Therapeutic activities;Patient/family education;Balance training    OT Goals(Current goals can be found in the care plan section) Acute Rehab OT Goals Patient Stated Goal: to go home OT Goal Formulation: With patient/family Time For Goal Achievement: 07/02/23 Potential to Achieve Goals: Fair  OT Frequency: Min 1X/week    Co-evaluation PT/OT/SLP Co-Evaluation/Treatment: Yes Reason for Co-Treatment: Complexity of the patient's impairments (multi-system involvement);For patient/therapist safety;To address functional/ADL transfers PT goals addressed during session: Mobility/safety with mobility;Strengthening/ROM OT goals addressed during session: ADL's and self-care;Strengthening/ROM      AM-PAC OT "6 Clicks" Daily Activity     Outcome Measure Help from another person eating meals?: A Little Help from another person taking care of personal grooming?: A Lot Help from another person toileting, which includes using toliet, bedpan, or urinal?: Total Help from another person bathing (including washing, rinsing, drying)?: Total Help from another person to put on and taking off regular upper body clothing?: Total Help from another person to put on and taking off regular lower body clothing?: Total 6 Click Score: 9   End of Session Equipment Utilized During Treatment: Gait belt;Other (comment);Rolling walker (2 wheels) (STEDY) Nurse Communication: Mobility status  Activity Tolerance: Patient tolerated treatment well Patient left: in chair;with call bell/phone within reach;with chair alarm set;with family/visitor present  OT Visit Diagnosis: Unsteadiness on feet (R26.81);Other abnormalities of gait and mobility  (R26.89);Pain;History of falling (Z91.81) Pain - Right/Left: Right Pain - part of body: Shoulder                Time: 1610-9604 OT Time Calculation (min): 54 min Charges:  OT General Charges $OT Visit: 1 Visit OT Evaluation $OT Eval Moderate Complexity: 1 Mod OT Treatments $Self Care/Home Management : 23-37 mins  Yarisa Lynam OTR/L, MS Acute Rehabilitation Department Office# [phone]   Selinda Flavin 06/18/2023, 1:53 PM

## 2023-06-18 NOTE — Progress Notes (Signed)
     Subjective: 1 Day Post-Op s/p Procedure(s): RIGHT REVERSE SHOULDER ARTHROPLASTY   Patient is alert, oriented. Had some confusion overnight with hydrocodone. Will plan to d/c hydrocodone and use tramadol only.  Family concerned about how she will mobilize with OT/PT today.  Denies chest pain, SOB, Calf pain. No nausea/vomiting. No other complaints.    Objective:  PE: VITALS:   Vitals:   06/17/23 1749 06/17/23 2158 06/18/23 0127 06/18/23 0433  BP: (!) 150/97 130/68 (!) 143/71 (!) 163/81  Pulse: 76 79 74 77  Resp: 18 17 17 17   Temp: 98.4 F (36.9 C) 98.2 F (36.8 C) 99.9 F (37.7 C) 97.6 F (36.4 C)  TempSrc: Oral Oral Oral Oral  SpO2: 100% 98% 99% 99%  Weight:      Height:        ABD soft Sensation intact distally Intact pulses distally Incision: dressing C/D/I  LABS  Results for orders placed or performed during the hospital encounter of 06/17/23 (from the past 24 hour(s))  CBC     Status: Abnormal   Collection Time: 06/18/23  3:46 AM  Result Value Ref Range   WBC 11.1 (H) 4.0 - 10.5 K/uL   RBC 2.68 (L) 3.87 - 5.11 MIL/uL   Hemoglobin 8.4 (L) 12.0 - 15.0 g/dL   HCT 40.9 (L) 81.1 - 91.4 %   MCV 99.6 80.0 - 100.0 fL   MCH 31.3 26.0 - 34.0 pg   MCHC 31.5 30.0 - 36.0 g/dL   RDW 78.2 95.6 - 21.3 %   Platelets 234 150 - 400 K/uL   nRBC 0.0 0.0 - 0.2 %    DG Shoulder Right Port  Result Date: 06/17/2023 CLINICAL DATA:  Status post reverse total shoulder arthroplasty, right. EXAM: RIGHT SHOULDER - 1 VIEW COMPARISON:  None Available. FINDINGS: Single frontal view of the shoulder obtained. Reverse total arthroplasty in place. No periprosthetic fracture. Recent postsurgical change includes air and edema in the soft tissues and joint space. IMPRESSION: Reverse total right shoulder arthroplasty in expected alignment. Electronically Signed   By: Narda Rutherford M.D.   On: 06/17/2023 13:22    Assessment/Plan: Principal Problem:   S/P reverse total shoulder  arthroplasty, right Active Problems:   Anemia  1 Day Post-Op s/p Procedure(s): RIGHT REVERSE SHOULDER ARTHROPLASTY  Weightbearing: NWB RLE when at rest, keep in sling. When getting up ok to weight bear through right arm while using rollator and for any transfers. Patient and family state that they really don't feel she can use a classic walker, ok to use rollator so patient can sit for breaks from time to time. Patient is not particularly mobile at baseline due to severe knee arthritis.  Insicional and dressing care: Reinforce dressings as needed VTE prophylaxis: restarting home Eliquis  Pain control: patient had some confusion last night with hydrocodone. Will d/c hydrocodone and only use tramadol and tylenol for now.  Follow - up plan: 2 weeks with Dr. Dion Saucier Dispo: patient had sufficient help at home with family and home health aides. May require HHPT for increased work on walking with rollator, but will see how she does with OT and PT today.   Contact information:   Janine Ores, Cordelia Poche [id number] 8-5  After hours and holidays please check [email or web address] for group call information for [facility]  Armida Sans 06/18/2023, 9:24 AM

## 2023-06-18 NOTE — Progress Notes (Signed)
Computer gave excess safe dose amount warning when scanning Ancef. Paged Delbert Harness to verify safety of administration.

## 2023-06-19 ENCOUNTER — Encounter (HOSPITAL_COMMUNITY): Payer: Self-pay | Admitting: Orthopedic Surgery

## 2023-06-19 DIAGNOSIS — M12811 Other specific arthropathies, not elsewhere classified, right shoulder: Secondary | ICD-10-CM | POA: Diagnosis not present

## 2023-06-19 LAB — CBC
Hemoglobin: 8.1 g/dL — ABNORMAL LOW (ref 12.0–15.0)
MCHC: 31.9 g/dL (ref 30.0–36.0)
MCV: 99.6 fL (ref 80.0–100.0)
Platelets: 212 10*3/uL (ref 150–400)
RBC: 2.55 MIL/uL — ABNORMAL LOW (ref 3.87–5.11)
RDW: 14.4 % (ref 11.5–15.5)
WBC: 11.3 10*3/uL — ABNORMAL HIGH (ref 4.0–10.5)
nRBC: 0 % (ref 0.0–0.2)

## 2023-06-19 MED ORDER — TRAMADOL HCL 50 MG PO TABS: 50.0000 mg | ORAL_TABLET | ORAL | 0 refills | Status: DC | PRN

## 2023-06-19 MED ORDER — SENNA-DOCUSATE SODIUM 8.6-50 MG PO TABS: 2.0000 | ORAL_TABLET | Freq: Every day | ORAL | 1 refills | Status: AC

## 2023-06-19 MED ORDER — ONDANSETRON HCL 4 MG PO TABS: 4.0000 mg | ORAL_TABLET | Freq: Three times a day (TID) | ORAL | 0 refills | Status: AC | PRN

## 2023-06-19 NOTE — OR Nursing (Signed)
Accessed patient chart Iceman not working and patient being discharged. Nurse on the floor called and is coming to switch out the product. Elna Breslow, RN

## 2023-06-19 NOTE — TOC Progression Note (Signed)
Transition of Care [facility]) - Progression Note    Patient Details  Name: Tricia Harrison MRN: [id number] Date of Birth: 1946/02/01  [facility] CM/SW Contact  Amada Jupiter, LCSW Phone Number: 06/19/2023, 3:12 PM  Clinical Narrative:     Have reviewed SNF bed offers with pt/ family and bed accepted at [facility] who can admit patient tomorrow.  Have received insurance authorization as well (cert# [id number]).  Have alerted MD/PA and RN.  Expected Discharge Plan: Skilled Nursing Facility Barriers to Discharge: Continued Medical Work up, English as a second language teacher, SNF Pending bed offer  Expected Discharge Plan and Services In-house Referral: Clinical Social Work   Post Acute Care Choice: Skilled Nursing Facility Living arrangements for the past 2 months: Single Family Home                 DME Arranged: N/A DME Agency: NA                   Social Determinants of Health (SDOH) Interventions SDOH Screenings   Food Insecurity: No Food Insecurity (06/17/2023)  Housing: Low Risk  (06/17/2023)  Transportation Needs: No Transportation Needs (06/17/2023)  Utilities: Not At Risk (06/17/2023)  Depression (PHQ2-9): Low Risk  (11/09/2018)  Tobacco Use: Medium Risk (06/17/2023)    Readmission Risk Interventions     No data to display

## 2023-06-19 NOTE — Progress Notes (Signed)
Subjective: 2 Days Post-Op s/p Procedure(s): RIGHT REVERSE SHOULDER ARTHROPLASTY   Patient is alert, oriented. Pain so far generally well controlled with tramadol, trying to stay away hydrocodone due to confusion the first night.  Family aware of plan to go to SNF, chose SNF options yesterday.  Denies chest pain, SOB, Calf pain. No nausea/vomiting. No other complaints.    Objective:  PE: VITALS:   Vitals:   06/18/23 1007 06/18/23 1341 06/18/23 2009 06/19/23 0602  BP: (!) 183/81 137/62 (!) 142/62 (!) 150/72  Pulse: 79 71 80 72  Resp: 18 16 17 17   Temp: 97.6 F (36.4 C) 98.3 F (36.8 C) 99.2 F (37.3 C) 98.4 F (36.9 C)  TempSrc:   Oral Oral  SpO2: 100% 93% 98% 98%  Weight:      Height:        ABD soft Sensation intact distally Intact pulses distally Incision: dressing C/D/I  LABS  Results for orders placed or performed during the hospital encounter of 06/17/23 (from the past 24 hour(s))  Basic metabolic panel     Status: Abnormal   Collection Time: 06/18/23  8:59 AM  Result Value Ref Range   Sodium 137 135 - 145 mmol/L   Potassium 4.0 3.5 - 5.1 mmol/L   Chloride 108 98 - 111 mmol/L   CO2 21 (L) 22 - 32 mmol/L   Glucose, Bld 139 (H) 70 - 99 mg/dL   BUN 30 (H) 8 - 23 mg/dL   Creatinine, Ser 7.84 (H) 0.44 - 1.00 mg/dL   Calcium 8.3 (L) 8.9 - 10.3 mg/dL   GFR, Estimated 35 (L) >60 mL/min   Anion gap 8 5 - 15  CBC     Status: Abnormal   Collection Time: 06/19/23  3:40 AM  Result Value Ref Range   WBC 11.3 (H) 4.0 - 10.5 K/uL   RBC 2.55 (L) 3.87 - 5.11 MIL/uL   Hemoglobin 8.1 (L) 12.0 - 15.0 g/dL   HCT 69.6 (L) 29.5 - 28.4 %   MCV 99.6 80.0 - 100.0 fL   MCH 31.8 26.0 - 34.0 pg   MCHC 31.9 30.0 - 36.0 g/dL   RDW 13.2 44.0 - 10.2 %   Platelets 212 150 - 400 K/uL   nRBC 0.0 0.0 - 0.2 %    DG Shoulder Right Port  Result Date: 06/17/2023 CLINICAL DATA:  Status post reverse total shoulder arthroplasty, right. EXAM: RIGHT SHOULDER - 1 VIEW COMPARISON:  None  Available. FINDINGS: Single frontal view of the shoulder obtained. Reverse total arthroplasty in place. No periprosthetic fracture. Recent postsurgical change includes air and edema in the soft tissues and joint space. IMPRESSION: Reverse total right shoulder arthroplasty in expected alignment. Electronically Signed   By: Narda Rutherford M.D.   On: 06/17/2023 13:22    Assessment/Plan: Principal Problem:   S/P reverse total shoulder arthroplasty, right Active Problems:   Anemia  2 Days Post-Op s/p Procedure(s): RIGHT REVERSE SHOULDER ARTHROPLASTY  Weightbearing: NWB RLE when at rest, keep in sling. When getting up,  ok to weight bear through right arm while using rollator and for any transfers. Patient and family state that they really don't feel she can use a classic walker, ok to use rollator so patient can sit for breaks from time to time. Patient is not particularly mobile at baseline due to severe knee arthritis.  Insicional and dressing care: Reinforce dressings as needed VTE prophylaxis: Home Eliquis  Pain control: patient had some confusion last night  with hydrocodone. Will d/c hydrocodone and only use tramadol and tylenol for now.  Follow - up plan: 2 weeks with Dr. Dion Saucier Dispo: After discussions yesterday after PT, currently working on SNF placement due to significant difficulty with transfers due to knees. This is her baseline. Will discharge to SNF when bed available.   Contact information:   Janine Ores, Cordelia Poche [id number] 8-5  After hours and holidays please check [email or web address] for group call information for [facility]  Armida Sans 06/19/2023, 7:37 AM

## 2023-06-19 NOTE — Progress Notes (Signed)
Physical Therapy Treatment Patient Details Name: Tricia Harrison MRN: [id number] DOB: Nov 02, 1946 Today's Date: 06/19/2023   History of Present Illness Patient is a 77 year old female S/P R reverse TSA on 06/17/23.  PMH: DVT,CAD,CKD, HTN, LTKA, DM, PE    PT Comments  The patient received in recliner. Patient assisted to stand at RW x 3, concentrating on LE strength in preparation for transfers. Continue PT for mobility. Audible crepitus in knee and hip joints.      Assistance Recommended at Discharge Frequent or constant Supervision/Assistance  If plan is discharge home, recommend the following:  Can travel by private vehicle    Two people to help with walking and/or transfers;A lot of help with bathing/dressing/bathroom;Assistance with cooking/housework;Assist for transportation;Help with stairs or ramp for entrance   No  Equipment Recommendations  None recommended by PT    Recommendations for Other Services       Precautions / Restrictions Precautions Precautions: Fall;Shoulder Type of Shoulder Precautions: no ROM shoulder, OK for hand wrist and elbow, per phone call on 7/24 with Dr.Landau patient is ok to use RUE on rollator for transfers and functional mobility. Shoulder Interventions: Shoulder sling/immobilizer;At all times;Off for dressing/bathing/exercises Precaution Comments: no, flexion/ABD right shoulder, may place hand on rollator Restrictions RUE Weight Bearing: Non weight bearing Other Position/Activity Restrictions: OK to WB on RW     Mobility  Bed Mobility               General bed mobility comments: in recliner    Transfers Overall transfer level: Needs assistance Equipment used: Rolling walker (2 wheels) Transfers: Sit to/from Stand Sit to Stand: +2 physical assistance, +2 safety/equipment, From elevated surface, Mod assist           General transfer comment: using short RW, patient assisted to stand from rclioner x 3, initially required  max thenmod support of 2 persons and  the pad to power up. Patient stood briefly each time.    Ambulation/Gait                   Stairs             Wheelchair Mobility     Tilt Bed    Modified Rankin (Stroke Patients Only)       Balance Overall balance assessment: Needs assistance, History of Falls Sitting-balance support: Single extremity supported, Feet supported Sitting balance-Leahy Scale: Fair Sitting balance - Comments: improved siting balance   Standing balance support: Bilateral upper extremity supported, During functional activity, Reliant on assistive device for balance Standing balance-Leahy Scale: Poor                              Cognition Arousal/Alertness: Awake/alert Behavior During Therapy: WFL for tasks assessed/performed Overall Cognitive Status: Within Functional Limits for tasks assessed                                          Exercises      General Comments        Pertinent Vitals/Pain Pain Assessment Pain Score: 8  Pain Location: right shoulder Pain Descriptors / Indicators: Grimacing, Guarding Pain Intervention(s): Monitored during session, Premedicated before session, Ice applied    Home Living  Prior Function            PT Goals (current goals can now be found in the care plan section) Progress towards PT goals: Progressing toward goals    Frequency    Min 1X/week      PT Plan Current plan remains appropriate    Co-evaluation PT/OT/SLP Co-Evaluation/Treatment: Yes Reason for Co-Treatment: Complexity of the patient's impairments (multi-system involvement);For patient/therapist safety;To address functional/ADL transfers PT goals addressed during session: Mobility/safety with mobility;Strengthening/ROM OT goals addressed during session: ADL's and self-care;Strengthening/ROM      AM-PAC PT "6 Clicks" Mobility   Outcome Measure  Help needed  turning from your back to your side while in a flat bed without using bedrails?: Total Help needed moving from lying on your back to sitting on the side of a flat bed without using bedrails?: Total Help needed moving to and from a bed to a chair (including a wheelchair)?: Total Help needed standing up from a chair using your arms (e.g., wheelchair or bedside chair)?: Total Help needed to walk in hospital room?: Total Help needed climbing 3-5 steps with a railing? : Total 6 Click Score: 6    End of Session Equipment Utilized During Treatment: Gait belt Activity Tolerance: Patient tolerated treatment well Patient left: in chair;with call bell/phone within reach Nurse Communication: Mobility status;Need for lift equipment PT Visit Diagnosis: Unsteadiness on feet (R26.81);Difficulty in walking, not elsewhere classified (R26.2);Repeated falls (R29.6)     Time: 1610-9604 PT Time Calculation (min) (ACUTE ONLY): 39 min  Charges:    $Therapeutic Activity: 8-22 mins PT General Charges $$ ACUTE PT VISIT: 1 Visit                     Blanchard Kelch PT Acute Rehabilitation Services Office [phone] Weekend [phone]    Rada Hay 06/19/2023, 12:36 PM

## 2023-06-19 NOTE — Plan of Care (Addendum)
  Problem: Education: Goal: Knowledge of the prescribed therapeutic regimen will improve Outcome: Progressing   Problem: Activity: Goal: Ability to tolerate increased activity will improve Outcome: Progressing   Problem: Pain Management: Goal: Pain level will decrease with appropriate interventions Outcome: Progressing  17:30 Pt requested to have her PIV removed , states that it feels sore and gets caught to something when she's using her hand. Noted PIV dressing is coming off and catheter is bent. Removed PIV per Pt request, noted small bruising to IV site, catheter is intact. RN informed Pt that we need IV access while she is here but Pt states "I want a break from being poked."

## 2023-06-20 DIAGNOSIS — M12811 Other specific arthropathies, not elsewhere classified, right shoulder: Secondary | ICD-10-CM | POA: Diagnosis not present

## 2023-06-20 MED ORDER — CARVEDILOL 3.125 MG PO TABS: 3.1250 mg | ORAL_TABLET | Freq: Every day | ORAL | 0 refills | Status: DC

## 2023-06-20 NOTE — Progress Notes (Signed)
Physical Therapy Treatment Patient Details Name: Tricia Harrison MRN: [id number] DOB: 1946/01/25 Today's Date: 06/20/2023   History of Present Illness Patient is a 77 year old female S/P R reverse TSA on 06/17/23.  PMH: DVT,CAD,CKD, HTN, LTKA, DM, PE    PT Comments  Pt continues cooperative but requiring increased time for all tasks.  Pt requesting use of BSC and assist to standing with Stedy and resettled on [facility] with nursing in attendance to assist with bathing.  Pt for dc this date to SNF.     Assistance Recommended at Discharge Frequent or constant Supervision/Assistance  If plan is discharge home, recommend the following:  Can travel by private vehicle    Two people to help with walking and/or transfers;A lot of help with bathing/dressing/bathroom;Assistance with cooking/housework;Assist for transportation;Help with stairs or ramp for entrance   No  Equipment Recommendations  None recommended by PT    Recommendations for Other Services       Precautions / Restrictions Precautions Precautions: Fall;Shoulder Type of Shoulder Precautions: no ROM shoulder, OK for hand wrist and elbow, per phone call on 7/24 with Dr.Landau patient is ok to use RUE on rollator for transfers and functional mobility. Shoulder Interventions: Shoulder sling/immobilizer;At all times;Off for dressing/bathing/exercises Precaution Booklet Issued: Yes (comment) Precaution Comments: no, flexion/ABD right shoulder, may place hand on rollator and bear weight to RUE to transfer Restrictions Weight Bearing Restrictions: Yes RUE Weight Bearing: Non weight bearing Other Position/Activity Restrictions: OK to WB on RW for transfers     Mobility  Bed Mobility Overal bed mobility: Needs Assistance Bed Mobility: Supine to Sit     Supine to sit: Mod assist, +2 for physical assistance, +2 for safety/equipment, HOB elevated     General bed mobility comments: Increased time with assist to manage LEs, control  trunk and complete rotation to EOB sitting with bedpad    Transfers Overall transfer level: Needs assistance Equipment used: Rolling walker (2 wheels) Transfers: Sit to/from Stand Sit to Stand: +2 physical assistance, +2 safety/equipment, From elevated surface, Mod assist           General transfer comment: cues for sequence, assist to bring wt up and fwd and with use of elevated bed Transfer via Lift Equipment: Stedy  Ambulation/Gait                   Stairs             Wheelchair Mobility     Tilt Bed    Modified Rankin (Stroke Patients Only)       Balance Overall balance assessment: Needs assistance, History of Falls Sitting-balance support: No upper extremity supported, Feet supported Sitting balance-Leahy Scale: Fair Sitting balance - Comments: improved siting balance                                    Cognition Arousal/Alertness: Awake/alert Behavior During Therapy: WFL for tasks assessed/performed Overall Cognitive Status: Within Functional Limits for tasks assessed Area of Impairment: Memory, Safety/judgement                     Memory: Decreased short-term memory   Safety/Judgement: Decreased awareness of deficits, Decreased awareness of safety     General Comments: patient was fixated on how she was able to complete things before surgery during session. patient had various family members in room during session as well who were able to help  redirect patient at times.        Exercises      General Comments        Pertinent Vitals/Pain Pain Assessment Pain Assessment: 0-10 Pain Score: 7  Pain Location: right shoulder Pain Descriptors / Indicators: Grimacing, Guarding Pain Intervention(s): Limited activity within patient's tolerance, Monitored during session, Premedicated before session    Home Living                          Prior Function            PT Goals (current goals can now be  found in the care plan section) Acute Rehab PT Goals Patient Stated Goal: to return home PT Goal Formulation: With patient/family Time For Goal Achievement: 07/02/23 Potential to Achieve Goals: Fair Progress towards PT goals: Progressing toward goals    Frequency    Min 1X/week      PT Plan Current plan remains appropriate    Co-evaluation              AM-PAC PT "6 Clicks" Mobility   Outcome Measure  Help needed turning from your back to your side while in a flat bed without using bedrails?: Total Help needed moving from lying on your back to sitting on the side of a flat bed without using bedrails?: Total Help needed moving to and from a bed to a chair (including a wheelchair)?: Total Help needed standing up from a chair using your arms (e.g., wheelchair or bedside chair)?: Total Help needed to walk in hospital room?: Total Help needed climbing 3-5 steps with a railing? : Total 6 Click Score: 6    End of Session Equipment Utilized During Treatment: Gait belt Activity Tolerance: Patient tolerated treatment well Patient left: Other (comment) (On BSC with nursing for toileting and bathing) Nurse Communication: Mobility status;Need for lift equipment PT Visit Diagnosis: Unsteadiness on feet (R26.81);Difficulty in walking, not elsewhere classified (R26.2);Repeated falls (R29.6)     Time: 8546-2703 PT Time Calculation (min) (ACUTE ONLY): 11 min  Charges:    $Therapeutic Activity: 8-22 mins PT General Charges $$ ACUTE PT VISIT: 1 Visit                     Mauro Kaufmann PT Acute Rehabilitation Services Pager [phone] Office [phone]    Jermain Curt 06/20/2023, 1:08 PM

## 2023-06-20 NOTE — Plan of Care (Signed)
  Problem: Education: Goal: Knowledge of General Education information will improve Description: Including pain rating scale, medication(s)/side effects and non-pharmacologic comfort measures Outcome: Progressing   Problem: Health Behavior/Discharge Planning: Goal: Ability to manage health-related needs will improve Outcome: Progressing   Problem: Clinical Measurements: Goal: Ability to maintain clinical measurements within normal limits will improve Outcome: Progressing Goal: Will remain free from infection Outcome: Progressing Goal: Diagnostic test results will improve Outcome: Progressing Goal: Respiratory complications will improve Outcome: Progressing Goal: Cardiovascular complication will be avoided Outcome: Progressing   Problem: Activity: Goal: Risk for activity intolerance will decrease Outcome: Progressing   Problem: Nutrition: Goal: Adequate nutrition will be maintained Outcome: Progressing   Problem: Coping: Goal: Level of anxiety will decrease Outcome: Progressing   Problem: Elimination: Goal: Will not experience complications related to bowel motility Outcome: Progressing Goal: Will not experience complications related to urinary retention Outcome: Progressing   Problem: Pain Managment: Goal: General experience of comfort will improve Outcome: Progressing   Problem: Safety: Goal: Ability to remain free from injury will improve Outcome: Progressing   Problem: Skin Integrity: Goal: Risk for impaired skin integrity will decrease Outcome: Progressing   Problem: Education: Goal: Knowledge of the prescribed therapeutic regimen will improve Outcome: Progressing Goal: Understanding of activity limitations/precautions following surgery will improve Outcome: Progressing Goal: Individualized Educational Video(s) Outcome: Progressing   Problem: Activity: Goal: Ability to tolerate increased activity will improve Outcome: Progressing   Problem: Pain  Management: Goal: Pain level will decrease with appropriate interventions Outcome: Progressing  Pt A/Ox4 on RA. Shoulder dressing clean, dry, intact. NWB, sling on. Purewick in place. Son at bedside. PRN tylenol administered for pain

## 2023-06-20 NOTE — TOC Transition Note (Signed)
Transition of Care [facility]) - CM/SW Discharge Note   Patient Details  Name: Tricia Harrison MRN: [id number] Date of Birth: December 12, 1945  [facility] CM/SW Contact:  Amada Jupiter, LCSW Phone Number: 06/20/2023, 10:40 AM   Clinical Narrative:     Pt medically cleared for discharge to SNF today.  Pt and family aware and agreeable.  Have bed ready and insurance authorization for admission to [facility].  PTAR called at 10:40am.  RN to call report to [phone].  No further TOC needs.  Final next level of care: Skilled Nursing Facility Barriers to Discharge: Barriers Resolved   Patient Goals and CMS Choice      Discharge Placement     Existing PASRR number confirmed : 06/18/23          Patient chooses bed at: [facility] Patient to be transferred to facility by: PTAR Name of family member notified: son and daughter-in-law Patient and family notified of of transfer: 06/20/23  Discharge Plan and Services Additional resources added to the After Visit Summary for   In-house Referral: Clinical Social Work   Post Acute Care Choice: Skilled Nursing Facility          DME Arranged: N/A DME Agency: NA                  Social Determinants of Health (SDOH) Interventions SDOH Screenings   Food Insecurity: No Food Insecurity (06/17/2023)  Housing: Low Risk  (06/17/2023)  Transportation Needs: No Transportation Needs (06/17/2023)  Utilities: Not At Risk (06/17/2023)  Depression (PHQ2-9): Low Risk  (11/09/2018)  Tobacco Use: Medium Risk (06/17/2023)     Readmission Risk Interventions     No data to display

## 2023-06-20 NOTE — Discharge Summary (Signed)
Discharge Summary  Patient ID: Tricia Harrison MRN: [id number] DOB/AGE: [birth year] 77 y.o.  Admit date: 06/17/2023 Discharge date: 06/20/2023  Admission Diagnoses:  S/P reverse total shoulder arthroplasty, right  Discharge Diagnoses:  Principal Problem:   S/P reverse total shoulder arthroplasty, right Active Problems:   Anemia   Past Medical History:  Diagnosis Date   Anemia    as a child   Anginal pain (HCC)    Asthma    as a child   Bladder incontinence    Chronic back pain    spinal stenosis and buldging disc;scoliosis   Chronic constipation    occasionally takes something OTC   Chronic kidney disease    Diabetes mellitus without complication (HCC)    per pt "Pre" for 20+yrs   Diverticulosis    DVT (deep venous thrombosis) (HCC)    Dyspnea    occasional   Gallstones    has known about this for 3-72yrs   GERD (gastroesophageal reflux disease)    takes Omeprazole daily   Glaucoma    H/O hiatal hernia    Heart murmur    Hemorrhoids    History of blood transfusion    no abnormal reaction noted   History of bronchitis    states its been a long time ago   History of gout    HLD (hyperlipidemia)    takes Fenofibrate daily   HTN (hypertension)    takes Diltiazem and Ramipril daily   Hypothyroidism    takes Synthroid daily   Left knee DJD    Macular degeneration    Nocturia    Palpitations    started in 2013 and only occasionally;last time noticed about 2-3wks ago and after drinking caffeine   Peripheral vascular disease (HCC)    PONV (postoperative nausea and vomiting)    problems swallowing after anethesia- 2/2023and slurred speech also   Pulmonary embolism with acute cor pulmonale (HCC) 07/2018   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   Sleep apnea    does not uses cpap    Surgeries: Procedure(s): RIGHT REVERSE SHOULDER ARTHROPLASTY on 06/17/2023   Consultants (if any):    Discharged Condition: Improved  Hospital Course: Tricia Harrison is an 77 y.o. female who was admitted 06/17/2023 with a diagnosis of S/P reverse total shoulder arthroplasty, right and went to the operating room on 06/17/2023 and underwent the above named procedures.    She was given perioperative antibiotics:  Anti-infectives (From admission, onward)    Start     Dose/Rate Route Frequency Ordered Stop   06/17/23 1230  ceFAZolin (ANCEF) IVPB 1 g/50 mL premix        1 g 100 mL/hr over 30 Minutes Intravenous Every 6 hours 06/17/23 1134 06/18/23 0629   06/17/23 0600  ceFAZolin (ANCEF) IVPB 2g/100 mL premix        2 g 200 mL/hr over 30 Minutes Intravenous On call to O.R. 06/17/23 5638 06/17/23 0806     .  She was given sequential compression devices, early ambulation for DVT prophylaxis. Due to her significant pain and inability to mobilize well with her legs, she will be discharged to SNF prior to being discharged home.  She benefited maximally from the hospital stay and there were no complications.    Recent vital signs:  Vitals:   06/19/23 2057 06/20/23 0634  BP: (!) 117/56 (!) 117/54  Pulse: 65 65  Resp: 16 12  Temp: 98.8 F (37.1 C) 98.7 F (37.1 C)  SpO2: 100% 98%    Recent laboratory studies:  Lab Results  Component Value Date   HGB 8.1 (L) 06/19/2023   HGB 8.4 (L) 06/18/2023   HGB 9.9 (L) 06/04/2023   Lab Results  Component Value Date   WBC 11.3 (H) 06/19/2023   PLT 212 06/19/2023   Lab Results  Component Value Date   INR 1.14 08/18/2018   Lab Results  Component Value Date   NA 137 06/18/2023   K 4.0 06/18/2023   CL 108 06/18/2023   CO2 21 (L) 06/18/2023   BUN 30 (H) 06/18/2023   CREATININE 1.53 (H) 06/18/2023   GLUCOSE 139 (H) 06/18/2023    Discharge Medications:   Allergies as of 06/20/2023       Reactions   Atorvastatin Other (See Comments)   Muscle pain   Codeine Nausea And Vomiting   Morphine And Codeine    B/p drops          Medication List     TAKE these medications    acetaminophen 650 MG CR tablet Commonly known as: TYLENOL Take 650 mg by mouth every 8 (eight) hours as needed for pain.   albuterol 108 (90 Base) MCG/ACT inhaler Commonly known as: VENTOLIN HFA Inhale 2 puffs into the lungs every 6 (six) hours as needed.   carvedilol 6.25 MG tablet Commonly known as: COREG Take 6.25 mg by mouth in the morning. What changed: Another medication with the same name was changed. Make sure you understand how and when to take each.   carvedilol 3.125 MG tablet Commonly known as: COREG Take 1 tablet (3.125 mg total) by mouth 2 (two) times daily with a meal. What changed: when to take this   cloNIDine 0.1 MG tablet Commonly known as: Catapres Take 1 tablet (0.1 mg total) by mouth 2 (two) times daily as needed (for blood pressure above 180 when she is SEATEd--not lying down).   diltiazem 180 MG 24 hr capsule Commonly known as: CARDIZEM CD Take 1 capsule (180 mg total) by mouth daily.   diltiazem 180 MG 24 hr tablet Commonly known as: CARDIZEM LA Take 180 mg by mouth daily.   Eliquis 5 MG Tabs tablet Generic drug: apixaban TAKE 1 TABLET BY MOUTH TWICE  DAILY   fenofibrate 145 MG tablet Commonly known as: TRICOR Take 145 mg by mouth daily.   Humira (2 Pen) 40 MG/0.4ML pen Generic drug: adalimumab Inject 40 mg into the skin every 14 (fourteen) days.   ketorolac 0.5 % ophthalmic solution Commonly known as: ACULAR Place 1 drop into both eyes in the morning, at noon, and at bedtime. Verified correct strength per Ophthalmalogist progress notes   levothyroxine 50 MCG tablet Commonly known as: SYNTHROID Take 50 mcg by mouth daily before breakfast.   lidocaine 5 % Commonly known as: LIDODERM Place 1 patch onto the skin daily as needed (pain).   Lumigan 0.01 % Soln Generic drug: bimatoprost Place 1 drop into both eyes at bedtime.   Moderna COVID-19 Vaccine 100 MCG/0.5ML injection Generic  drug: COVID-19 mRNA vaccine (Moderna) Inject into the muscle.   omeprazole 20 MG capsule Commonly known as: PRILOSEC Take 20 mg by mouth daily.   ondansetron 4 MG tablet Commonly known as: Zofran Take 1 tablet (4 mg total) by mouth every 8 (eight) hours as needed for nausea or vomiting. What changed: reasons to take this   polyethylene glycol 17 g packet Commonly known as:  MIRALAX / GLYCOLAX Take 17 g by mouth daily as needed for mild constipation.   rosuvastatin 20 MG tablet Commonly known as: CRESTOR TAKE 1 TABLET BY MOUTH  DAILY AT 6 PM.   sennosides-docusate sodium 8.6-50 MG tablet Commonly known as: SENOKOT-S Take 2 tablets by mouth daily.   traMADol 50 MG tablet Commonly known as: ULTRAM Take 1 tablet (50 mg total) by mouth every 4 (four) hours as needed for moderate pain or severe pain. What changed:  how much to take when to take this   Trulance 3 MG Tabs Generic drug: Plecanatide Take 3 mg by mouth daily as needed (constipation).   valsartan 80 MG tablet Commonly known as: Diovan Take 1 tablet (80 mg total) by mouth daily. What changed:  when to take this reasons to take this   VITAMIN D3 PO Take 1 tablet by mouth daily.   Voltaren 1 % Gel Generic drug: diclofenac Sodium Apply 2 g topically daily as needed (pain).        Diagnostic Studies: DG Shoulder Right Port  Result Date: 06/17/2023 CLINICAL DATA:  Status post reverse total shoulder arthroplasty, right. EXAM: RIGHT SHOULDER - 1 VIEW COMPARISON:  None Available. FINDINGS: Single frontal view of the shoulder obtained. Reverse total arthroplasty in place. No periprosthetic fracture. Recent postsurgical change includes air and edema in the soft tissues and joint space. IMPRESSION: Reverse total right shoulder arthroplasty in expected alignment. Electronically Signed   By: Narda Rutherford M.D.   On: 06/17/2023 13:22    Disposition:      Contact information for follow-up providers     Teryl Lucy, MD. Schedule an appointment as soon as possible for a visit in 2 week(s).   Specialty: Orthopedic Surgery Contact information: [address] [place] [address] [phone]              Contact information for after-discharge care     Destination     [facility] .   Service: Skilled Nursing Contact information: [address] Test Update Address [place] [address] [phone]                      Signed: Annita Brod 06/20/2023, 7:50 AM

## 2023-06-20 NOTE — Progress Notes (Signed)
RN called report, spoke to [place].

## 2023-06-26 DIAGNOSIS — I10 Essential (primary) hypertension: Secondary | ICD-10-CM

## 2023-06-26 DIAGNOSIS — G4729 Other circadian rhythm sleep disorder: Secondary | ICD-10-CM

## 2023-06-26 DIAGNOSIS — R1312 Dysphagia, oropharyngeal phase: Secondary | ICD-10-CM

## 2023-06-26 DIAGNOSIS — Z96611 Presence of right artificial shoulder joint: Secondary | ICD-10-CM | POA: Diagnosis not present

## 2023-07-03 DIAGNOSIS — D649 Anemia, unspecified: Secondary | ICD-10-CM

## 2023-07-03 DIAGNOSIS — N178 Other acute kidney failure: Secondary | ICD-10-CM

## 2023-07-03 DIAGNOSIS — G4729 Other circadian rhythm sleep disorder: Secondary | ICD-10-CM

## 2023-07-03 DIAGNOSIS — E860 Dehydration: Secondary | ICD-10-CM

## 2023-07-04 ENCOUNTER — Emergency Department (HOSPITAL_COMMUNITY): Payer: Medicare HMO

## 2023-07-04 ENCOUNTER — Inpatient Hospital Stay (HOSPITAL_COMMUNITY)
Admission: EM | Admit: 2023-07-04 | Discharge: 2023-07-16 | DRG: 682 | Disposition: A | Discharge status: Skilled Nursing Facility | Payer: Medicare HMO | Source: Skilled Nursing Facility | Attending: Internal Medicine | Admitting: Internal Medicine

## 2023-07-04 ENCOUNTER — Encounter (HOSPITAL_COMMUNITY): Payer: Self-pay

## 2023-07-04 ENCOUNTER — Emergency Department (HOSPITAL_BASED_OUTPATIENT_CLINIC_OR_DEPARTMENT_OTHER): Payer: Medicare HMO

## 2023-07-04 ENCOUNTER — Other Ambulatory Visit: Payer: Self-pay

## 2023-07-04 DIAGNOSIS — E119 Type 2 diabetes mellitus without complications: Secondary | ICD-10-CM

## 2023-07-04 DIAGNOSIS — I69354 Hemiplegia and hemiparesis following cerebral infarction affecting left non-dominant side: Secondary | ICD-10-CM

## 2023-07-04 DIAGNOSIS — Z96642 Presence of left artificial hip joint: Secondary | ICD-10-CM | POA: Diagnosis present

## 2023-07-04 DIAGNOSIS — I700 Atherosclerosis of aorta: Secondary | ICD-10-CM | POA: Diagnosis present

## 2023-07-04 DIAGNOSIS — K625 Hemorrhage of anus and rectum: Secondary | ICD-10-CM | POA: Diagnosis present

## 2023-07-04 DIAGNOSIS — R1310 Dysphagia, unspecified: Secondary | ICD-10-CM | POA: Diagnosis not present

## 2023-07-04 DIAGNOSIS — Z86711 Personal history of pulmonary embolism: Secondary | ICD-10-CM

## 2023-07-04 DIAGNOSIS — E1122 Type 2 diabetes mellitus with diabetic chronic kidney disease: Secondary | ICD-10-CM | POA: Diagnosis present

## 2023-07-04 DIAGNOSIS — E039 Hypothyroidism, unspecified: Secondary | ICD-10-CM

## 2023-07-04 DIAGNOSIS — R32 Unspecified urinary incontinence: Secondary | ICD-10-CM | POA: Diagnosis present

## 2023-07-04 DIAGNOSIS — R1312 Dysphagia, oropharyngeal phase: Secondary | ICD-10-CM | POA: Diagnosis not present

## 2023-07-04 DIAGNOSIS — E11649 Type 2 diabetes mellitus with hypoglycemia without coma: Secondary | ICD-10-CM | POA: Diagnosis not present

## 2023-07-04 DIAGNOSIS — Z8719 Personal history of other diseases of the digestive system: Secondary | ICD-10-CM

## 2023-07-04 DIAGNOSIS — Z8249 Family history of ischemic heart disease and other diseases of the circulatory system: Secondary | ICD-10-CM

## 2023-07-04 DIAGNOSIS — F0390 Unspecified dementia without behavioral disturbance: Secondary | ICD-10-CM | POA: Diagnosis present

## 2023-07-04 DIAGNOSIS — K5731 Diverticulosis of large intestine without perforation or abscess with bleeding: Secondary | ICD-10-CM | POA: Diagnosis present

## 2023-07-04 DIAGNOSIS — K644 Residual hemorrhoidal skin tags: Secondary | ICD-10-CM | POA: Diagnosis present

## 2023-07-04 DIAGNOSIS — Z96611 Presence of right artificial shoulder joint: Secondary | ICD-10-CM | POA: Diagnosis present

## 2023-07-04 DIAGNOSIS — D62 Acute posthemorrhagic anemia: Secondary | ICD-10-CM | POA: Diagnosis present

## 2023-07-04 DIAGNOSIS — J45909 Unspecified asthma, uncomplicated: Secondary | ICD-10-CM | POA: Diagnosis present

## 2023-07-04 DIAGNOSIS — I129 Hypertensive chronic kidney disease with stage 1 through stage 4 chronic kidney disease, or unspecified chronic kidney disease: Secondary | ICD-10-CM | POA: Diagnosis present

## 2023-07-04 DIAGNOSIS — I10 Essential (primary) hypertension: Secondary | ICD-10-CM | POA: Diagnosis present

## 2023-07-04 DIAGNOSIS — M549 Dorsalgia, unspecified: Secondary | ICD-10-CM | POA: Diagnosis present

## 2023-07-04 DIAGNOSIS — E860 Dehydration: Secondary | ICD-10-CM | POA: Diagnosis not present

## 2023-07-04 DIAGNOSIS — Z8379 Family history of other diseases of the digestive system: Secondary | ICD-10-CM

## 2023-07-04 DIAGNOSIS — M4800 Spinal stenosis, site unspecified: Secondary | ICD-10-CM | POA: Diagnosis present

## 2023-07-04 DIAGNOSIS — K8020 Calculus of gallbladder without cholecystitis without obstruction: Secondary | ICD-10-CM | POA: Insufficient documentation

## 2023-07-04 DIAGNOSIS — Z885 Allergy status to narcotic agent status: Secondary | ICD-10-CM

## 2023-07-04 DIAGNOSIS — E1136 Type 2 diabetes mellitus with diabetic cataract: Secondary | ICD-10-CM | POA: Diagnosis present

## 2023-07-04 DIAGNOSIS — M7989 Other specified soft tissue disorders: Secondary | ICD-10-CM | POA: Diagnosis not present

## 2023-07-04 DIAGNOSIS — R351 Nocturia: Secondary | ICD-10-CM | POA: Diagnosis present

## 2023-07-04 DIAGNOSIS — Z79890 Hormone replacement therapy (postmenopausal): Secondary | ICD-10-CM

## 2023-07-04 DIAGNOSIS — Z96652 Presence of left artificial knee joint: Secondary | ICD-10-CM | POA: Diagnosis present

## 2023-07-04 DIAGNOSIS — Z6832 Body mass index (BMI) 32.0-32.9, adult: Secondary | ICD-10-CM

## 2023-07-04 DIAGNOSIS — E1151 Type 2 diabetes mellitus with diabetic peripheral angiopathy without gangrene: Secondary | ICD-10-CM | POA: Diagnosis present

## 2023-07-04 DIAGNOSIS — F05 Delirium due to known physiological condition: Secondary | ICD-10-CM | POA: Diagnosis present

## 2023-07-04 DIAGNOSIS — Z7901 Long term (current) use of anticoagulants: Secondary | ICD-10-CM

## 2023-07-04 DIAGNOSIS — Z9889 Other specified postprocedural states: Secondary | ICD-10-CM

## 2023-07-04 DIAGNOSIS — H3530 Unspecified macular degeneration: Secondary | ICD-10-CM | POA: Diagnosis present

## 2023-07-04 DIAGNOSIS — E785 Hyperlipidemia, unspecified: Secondary | ICD-10-CM | POA: Diagnosis present

## 2023-07-04 DIAGNOSIS — G4733 Obstructive sleep apnea (adult) (pediatric): Secondary | ICD-10-CM | POA: Diagnosis present

## 2023-07-04 DIAGNOSIS — Z86718 Personal history of other venous thrombosis and embolism: Secondary | ICD-10-CM

## 2023-07-04 DIAGNOSIS — N1832 Chronic kidney disease, stage 3b: Secondary | ICD-10-CM | POA: Diagnosis present

## 2023-07-04 DIAGNOSIS — Z833 Family history of diabetes mellitus: Secondary | ICD-10-CM

## 2023-07-04 DIAGNOSIS — I2510 Atherosclerotic heart disease of native coronary artery without angina pectoris: Secondary | ICD-10-CM | POA: Diagnosis present

## 2023-07-04 DIAGNOSIS — H409 Unspecified glaucoma: Secondary | ICD-10-CM | POA: Diagnosis present

## 2023-07-04 DIAGNOSIS — Z87891 Personal history of nicotine dependence: Secondary | ICD-10-CM

## 2023-07-04 DIAGNOSIS — Z90710 Acquired absence of both cervix and uterus: Secondary | ICD-10-CM

## 2023-07-04 DIAGNOSIS — I272 Pulmonary hypertension, unspecified: Secondary | ICD-10-CM | POA: Diagnosis present

## 2023-07-04 DIAGNOSIS — K649 Unspecified hemorrhoids: Secondary | ICD-10-CM | POA: Insufficient documentation

## 2023-07-04 DIAGNOSIS — N136 Pyonephrosis: Secondary | ICD-10-CM | POA: Diagnosis not present

## 2023-07-04 DIAGNOSIS — N170 Acute kidney failure with tubular necrosis: Secondary | ICD-10-CM | POA: Diagnosis not present

## 2023-07-04 DIAGNOSIS — K449 Diaphragmatic hernia without obstruction or gangrene: Secondary | ICD-10-CM | POA: Diagnosis present

## 2023-07-04 DIAGNOSIS — D649 Anemia, unspecified: Secondary | ICD-10-CM | POA: Diagnosis not present

## 2023-07-04 DIAGNOSIS — N179 Acute kidney failure, unspecified: Secondary | ICD-10-CM | POA: Diagnosis not present

## 2023-07-04 DIAGNOSIS — I69392 Facial weakness following cerebral infarction: Secondary | ICD-10-CM

## 2023-07-04 DIAGNOSIS — Z888 Allergy status to other drugs, medicaments and biological substances status: Secondary | ICD-10-CM

## 2023-07-04 DIAGNOSIS — R29818 Other symptoms and signs involving the nervous system: Secondary | ICD-10-CM

## 2023-07-04 DIAGNOSIS — I5189 Other ill-defined heart diseases: Secondary | ICD-10-CM

## 2023-07-04 DIAGNOSIS — K640 First degree hemorrhoids: Secondary | ICD-10-CM | POA: Diagnosis present

## 2023-07-04 DIAGNOSIS — E669 Obesity, unspecified: Secondary | ICD-10-CM | POA: Diagnosis present

## 2023-07-04 DIAGNOSIS — Z8709 Personal history of other diseases of the respiratory system: Secondary | ICD-10-CM

## 2023-07-04 DIAGNOSIS — Z79899 Other long term (current) drug therapy: Secondary | ICD-10-CM

## 2023-07-04 DIAGNOSIS — K5909 Other constipation: Secondary | ICD-10-CM | POA: Diagnosis present

## 2023-07-04 DIAGNOSIS — R933 Abnormal findings on diagnostic imaging of other parts of digestive tract: Secondary | ICD-10-CM | POA: Clinically undetermined

## 2023-07-04 DIAGNOSIS — R011 Cardiac murmur, unspecified: Secondary | ICD-10-CM | POA: Diagnosis present

## 2023-07-04 DIAGNOSIS — Z9049 Acquired absence of other specified parts of digestive tract: Secondary | ICD-10-CM

## 2023-07-04 DIAGNOSIS — Z825 Family history of asthma and other chronic lower respiratory diseases: Secondary | ICD-10-CM

## 2023-07-04 DIAGNOSIS — D75839 Thrombocytosis, unspecified: Secondary | ICD-10-CM | POA: Diagnosis present

## 2023-07-04 DIAGNOSIS — R159 Full incontinence of feces: Secondary | ICD-10-CM | POA: Diagnosis present

## 2023-07-04 DIAGNOSIS — M109 Gout, unspecified: Secondary | ICD-10-CM | POA: Diagnosis present

## 2023-07-04 DIAGNOSIS — G8929 Other chronic pain: Secondary | ICD-10-CM | POA: Diagnosis present

## 2023-07-04 DIAGNOSIS — K219 Gastro-esophageal reflux disease without esophagitis: Secondary | ICD-10-CM | POA: Diagnosis present

## 2023-07-04 DIAGNOSIS — R948 Abnormal results of function studies of other organs and systems: Secondary | ICD-10-CM | POA: Diagnosis present

## 2023-07-04 DIAGNOSIS — Z8049 Family history of malignant neoplasm of other genital organs: Secondary | ICD-10-CM

## 2023-07-04 DIAGNOSIS — Z807 Family history of other malignant neoplasms of lymphoid, hematopoietic and related tissues: Secondary | ICD-10-CM

## 2023-07-04 DIAGNOSIS — M419 Scoliosis, unspecified: Secondary | ICD-10-CM | POA: Diagnosis present

## 2023-07-04 LAB — HEMOGLOBIN AND HEMATOCRIT, BLOOD
HCT: 31.2 % — ABNORMAL LOW (ref 36.0–46.0)
Hemoglobin: 10.1 g/dL — ABNORMAL LOW (ref 12.0–15.0)

## 2023-07-04 LAB — COMPREHENSIVE METABOLIC PANEL
ALT: 15 U/L (ref 0–44)
AST: 35 U/L (ref 15–41)
Albumin: 2.3 g/dL — ABNORMAL LOW (ref 3.5–5.0)
Alkaline Phosphatase: 47 U/L (ref 38–126)
Anion gap: 9 (ref 5–15)
BUN: 77 mg/dL — ABNORMAL HIGH (ref 8–23)
CO2: 18 mmol/L — ABNORMAL LOW (ref 22–32)
Calcium: 8.1 mg/dL — ABNORMAL LOW (ref 8.9–10.3)
Chloride: 106 mmol/L (ref 98–111)
Creatinine, Ser: 2.7 mg/dL — ABNORMAL HIGH (ref 0.44–1.00)
GFR, Estimated: 18 mL/min — ABNORMAL LOW (ref >60)
Glucose, Bld: 135 mg/dL — ABNORMAL HIGH (ref 70–99)
Potassium: 4.3 mmol/L (ref 3.5–5.1)
Sodium: 133 mmol/L — ABNORMAL LOW (ref 135–145)
Total Bilirubin: 0.5 mg/dL (ref 0.3–1.2)
Total Protein: 5.9 g/dL — ABNORMAL LOW (ref 6.5–8.1)

## 2023-07-04 LAB — CK: Total CK: 110 U/L (ref 38–234)

## 2023-07-04 LAB — CBC WITH DIFFERENTIAL/PLATELET
Abs Immature Granulocytes: 0.07 10*3/uL (ref 0.00–0.07)
Basophils Absolute: 0 10*3/uL (ref 0.0–0.1)
Basophils Relative: 0 %
Eosinophils Absolute: 0.1 10*3/uL (ref 0.0–0.5)
Eosinophils Relative: 1 %
HCT: 27.8 % — ABNORMAL LOW (ref 36.0–46.0)
Hemoglobin: 8.9 g/dL — ABNORMAL LOW (ref 12.0–15.0)
Immature Granulocytes: 1 %
Lymphocytes Relative: 12 %
Lymphs Abs: 1.1 10*3/uL (ref 0.7–4.0)
MCH: 30.5 pg (ref 26.0–34.0)
MCHC: 32 g/dL (ref 30.0–36.0)
MCV: 95.2 fL (ref 80.0–100.0)
Monocytes Absolute: 2.3 10*3/uL — ABNORMAL HIGH (ref 0.1–1.0)
Monocytes Relative: 24 %
Neutro Abs: 6 10*3/uL (ref 1.7–7.7)
Neutrophils Relative %: 62 %
Platelets: 539 10*3/uL — ABNORMAL HIGH (ref 150–400)
RBC: 2.92 MIL/uL — ABNORMAL LOW (ref 3.87–5.11)
RDW: 14.4 % (ref 11.5–15.5)
WBC: 9.6 10*3/uL (ref 4.0–10.5)
nRBC: 0 % (ref 0.0–0.2)

## 2023-07-04 LAB — GLUCOSE, CAPILLARY: Glucose-Capillary: 75 mg/dL (ref 70–99)

## 2023-07-04 MED ORDER — ONDANSETRON HCL 4 MG/2ML IJ SOLN: 4.0000 mg | Freq: Four times a day (QID) | INTRAMUSCULAR | Status: DC | PRN

## 2023-07-04 MED ORDER — ONDANSETRON HCL 4 MG PO TABS: 4.0000 mg | ORAL_TABLET | Freq: Four times a day (QID) | ORAL | Status: DC | PRN

## 2023-07-04 MED ORDER — SODIUM CHLORIDE 0.9 % IV SOLN: INTRAVENOUS | Status: DC

## 2023-07-04 MED ORDER — ALBUMIN HUMAN 25 % IV SOLN
25.0000 g | Freq: Once | INTRAVENOUS | Status: AC
  Administered 2023-07-04: 25 g via INTRAVENOUS
  Filled 2023-07-04: qty 100

## 2023-07-04 MED ORDER — LATANOPROST 0.005 % OP SOLN
1.0000 [drp] | Freq: Every day | OPHTHALMIC | Status: DC
  Administered 2023-07-04 – 2023-07-15 (×12): 1 [drp] via OPHTHALMIC
  Filled 2023-07-04: qty 2.5

## 2023-07-04 MED ORDER — SODIUM CHLORIDE 0.9 % IV BOLUS
500.0000 mL | Freq: Once | INTRAVENOUS | Status: AC
  Administered 2023-07-04: 500 mL via INTRAVENOUS

## 2023-07-04 MED ORDER — ACETAMINOPHEN 325 MG PO TABS
650.0000 mg | ORAL_TABLET | Freq: Four times a day (QID) | ORAL | Status: DC | PRN
  Administered 2023-07-05 – 2023-07-15 (×9): 650 mg via ORAL
  Filled 2023-07-04 (×9): qty 2

## 2023-07-04 MED ORDER — ACETAMINOPHEN 650 MG RE SUPP: 650.0000 mg | Freq: Four times a day (QID) | RECTAL | Status: DC | PRN

## 2023-07-04 MED ORDER — OXYCODONE HCL 5 MG PO TABS
5.0000 mg | ORAL_TABLET | Freq: Four times a day (QID) | ORAL | Status: DC | PRN
  Administered 2023-07-06 – 2023-07-16 (×15): 5 mg via ORAL
  Filled 2023-07-04 (×17): qty 1

## 2023-07-04 MED ORDER — PANTOPRAZOLE SODIUM 40 MG PO TBEC
40.0000 mg | DELAYED_RELEASE_TABLET | Freq: Every day | ORAL | Status: DC
  Administered 2023-07-04 – 2023-07-16 (×13): 40 mg via ORAL
  Filled 2023-07-04 (×13): qty 1

## 2023-07-04 MED ORDER — LEVOTHYROXINE SODIUM 50 MCG PO TABS
50.0000 ug | ORAL_TABLET | Freq: Every day | ORAL | Status: DC
  Administered 2023-07-05 – 2023-07-16 (×12): 50 ug via ORAL
  Filled 2023-07-04 (×12): qty 1

## 2023-07-04 NOTE — H&P (Signed)
History and Physical    Patient: Tricia Harrison GNF:[id number] DOB: [birth year] DOA: 07/04/2023 DOS: the patient was seen and examined on 07/04/2023 PCP: Darrow Bussing, MD  Patient coming from: Home  Chief Complaint:  Chief Complaint  Patient presents with   Dehydration   HPI: Tricia Harrison is a 77 y.o. female with medical history significant of anemia, anginal pain, asthma as a child, bladder incontinence, chronic back pain, chronic constipation chronic kidney disease 3B, diverticulosis,  dyspnea, gallstones, GERD, glaucoma, heart murmur, hemorrhoids, gout, bronchitis, hyperlipidemia, hypertension, hypothyroidism, left knee DJD, macular degeneration, nocturia, palpitations, peripheral vascular disease, history of DVT, pulmonary embolism with acute cor pulmonale in 2019 on apixaban, sleep apnea not on CPAP who was sent from her rehab facility due to decreased oral intake, several episodes of nausea and emesis earlier this week.  She has also been having hematochezia at the SNF.  She has lost about 10 pounds since her surgery last month.  She also has chronic constipation.  Recently she was given stool softeners and laxatives at the nursing home developing diarrhea for a few days.  She has not taking antibiotics in the past 3 months.  She also complains of joint and back pain.  She is now very active at the moment.  She used to walk home with assistive devices, but has been having a lot of difficulty getting out of bed in the nursing home.  When she was at home, she would get dyspneic on exertion very quickly.  She occasionally had some transient chest pressure after exertion.  She has only gotten out of bed a couple of times since being in rehabilitation.  According to her family members, she seems to be more physically deconditioned now. He denied fever, chills, rhinorrhea, sore throat, wheezing or hemoptysis.  No chest pain, palpitations, diaphoresis, PND, orthopnea or pitting edema of the  lower extremities. No flank pain, dysuria, frequency or hematuria.  No polyuria, polydipsia, polyphagia or blurred vision.   Lab work: Her CBC showed a white count of 9.6, hemoglobin 8.9 g/dL platelets 846.  CMP showed a sodium 133 and CO2 of 18 mmol/L with a normal anion gap.  The rest of the electrolytes were normal after calcium correction.  Glucose at 135, BUN 77 and creatinine 2.70 mg/dL (baseline creatinine ranges between 1.08 and 1.53 mg/dL since 9629).  Total protein 5.9 and albumin 2.3 g/dL.  The rest of the LFTs were normal.  Imaging: Portable 1 view chest radiograph showed low lung volumes without evidence of acute cardiopulmonary disease.   ED course: Initial vital signs were temperature 98.3 F, pulse 69, respiration 18, BP 103/60 mmHg O2 sat 99% on room air.  The patient received 500 mL of normal saline bolus.  Review of Systems: As mentioned in the history of present illness. All other systems reviewed and are negative.  Past Medical History:  Diagnosis Date   Anemia    as a child   Anginal pain (HCC)    Asthma    as a child   Bladder incontinence    Chronic back pain    spinal stenosis and buldging disc;scoliosis   Chronic constipation    occasionally takes something OTC   Chronic kidney disease    Diabetes mellitus without complication (HCC)    per pt "Pre" for 20+yrs   Diverticulosis    DVT (deep venous thrombosis) (HCC)    Dyspnea    occasional   Gallstones    has known about this for  3-21yrs   GERD (gastroesophageal reflux disease)    takes Omeprazole daily   Glaucoma    H/O hiatal hernia    Heart murmur    Hemorrhoids    History of blood transfusion    no abnormal reaction noted   History of bronchitis    states its been a long time ago   History of gout    HLD (hyperlipidemia)    takes Fenofibrate daily   HTN (hypertension)    takes Diltiazem and Ramipril daily   Hypothyroidism    takes Synthroid daily   Left knee DJD    Macular degeneration     Nocturia    Palpitations    started in 2013 and only occasionally;last time noticed about 2-3wks ago and after drinking caffeine   Peripheral vascular disease (HCC)    PONV (postoperative nausea and vomiting)    problems swallowing after anethesia- 2/2023and slurred speech also   Pulmonary embolism with acute cor pulmonale (HCC) 07/2018   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   Sleep apnea    does not uses cpap   Past Surgical History:  Procedure Laterality Date   3-day EVENT MONITOR  01/2019   Mostly normal monitor.  Predominantly sinus rhythm (rate range 48-105 bpm, average 67 bpm) 9 short atrial runs (fastest = 13 beats rate 135 bpm, longest ~17 seconds-101 bpm) -> not noted on symptom log.  No sustained arrhythmias (tachycardia or bradycardia), or pauses..  Symptoms are noted with PVCs.   bilateral cataract surgery      CHOLECYSTECTOMY N/A 01/10/2022   Procedure: LAPAROSCOPIC CHOLECYSTECTOMY WITH INTRAOPERATIVE CHOLANGIOGRAM;  Surgeon: Karie Soda, MD;  Location: [facility];  Service: General;  Laterality: N/A;   COLONOSCOPY     CT ANGIOGRAM OF CHEST - PE PROTOCOL  07/2018   Bilateral nonocclusive pulmonary emboli evidence of right heart strain consistent with "submassive "/intermediate PE.  -->  Code PE called.   DILATION AND CURETTAGE OF UTERUS     multiple about 6-7 times   ESOPHAGOGASTRODUODENOSCOPY     NM VQ LUNG SCAN ([facility] HX)  11/23/2018   Low probability for PE   REVERSE SHOULDER ARTHROPLASTY Right 06/17/2023   Procedure: RIGHT REVERSE SHOULDER ARTHROPLASTY;  Surgeon: Teryl Lucy, MD;  Location: [facility];  Service: Orthopedics;  Laterality: Right;   RIGHT/LEFT HEART CATH AND CORONARY ANGIOGRAPHY N/A 08/19/2018   Procedure: RIGHT/LEFT HEART CATH AND CORONARY ANGIOGRAPHY;  Surgeon: Corky Crafts, MD;  [facility] INVASIVE CV LAB;  Service: Cardiovascular:: Nonobstructive CAD.  Mild pulmonary pretension.   Mean PA pressure 26 mmHg with PA P 48/20 mmHg.  Normal LVEDP.   THYROID SURGERY Right 2010   TOTAL HIP ARTHROPLASTY Left 2005   TOTAL KNEE ARTHROPLASTY Left 02/21/2014   Procedure: TOTAL KNEE ARTHROPLASTY;  Surgeon: Nilda Simmer, MD;  Location: [facility] OR;  Service: Orthopedics;  Laterality: Left;   TRANSTHORACIC ECHOCARDIOGRAM  07/2018    (In setting of acute PE) mild LVH.  EF 55 to 60%.  GR 1 DD.  Aortic sclerosis but no stenosis.  Mild regurgitation.  Mild RA dilation.  Moderate LA dilation.  Moderate tricuspid regurgitation with severe primary hypertension estimated PA pressure 71 mmHg.  RV poorly visualized   TRANSTHORACIC ECHOCARDIOGRAM  10/2018 - 11/2019   a) EF 60-65%.  GR 1 DD.  Focal basal LVH. Mod AI/ no AS. Mild LA dilation.  Normal RV size and fxn.  Mod PI w/ mild TR.  Peak PAP ~ 38 mmHg; b) Normal EF 60 to 65%.  Moderate LVH-GR 1 DD.  R WMA.  Mild AS w/ trivial AI.  Grossly normal PA, RV and RA size.  Normal RV function.  Normal PA pressures.   TRANSTHORACIC ECHOCARDIOGRAM  01/2021    EF 55%.  Normal LV function.  Mild LVH.  GR 1 DD.  Normal PA pressures.  Severe LA dilation.  (Consistent with more significant diastolic dysfunction).  Mild-possibly mild to moderate aortic insufficiency.  Borderline elevated RAP.  Trivial pericardial effusion.  (Stable)   VAGINAL HYSTERECTOMY  1979   Social History:  reports that she quit smoking about 29 years ago. Her smoking use included cigarettes. She started smoking about 58 years ago. She has a 14.6 pack-year smoking history. She has never used smokeless tobacco. She reports current alcohol use. She reports that she does not use drugs.  Allergies  Allergen Reactions   Atorvastatin Other (See Comments)    Muscle pain   Codeine Nausea And Vomiting   Morphine And Codeine     B/p drops     Family History  Problem Relation Age of Onset   Uterine cancer Mother    Heart disease Mother    Hypertension Mother    Diabetes Mother    Cirrhosis  Father    Pneumonia Father    Lymphoma Sister        ,non hodgkin    Diabetes Sister    Breast cancer Neg Hx     Prior to Admission medications   Medication Sig Start Date End Date Taking? Authorizing Provider  acetaminophen (TYLENOL) 650 MG CR tablet Take 650 mg by mouth every 8 (eight) hours as needed for pain.    [provider]  albuterol (VENTOLIN HFA) 108 (90 Base) MCG/ACT inhaler Inhale 2 puffs into the lungs every 6 (six) hours as needed. 07/02/19   Coral Ceo, NP  apixaban (ELIQUIS) 5 MG TABS tablet TAKE 1 TABLET BY MOUTH TWICE  DAILY 04/01/23   Marykay Lex, MD  bimatoprost (LUMIGAN) 0.01 % SOLN Place 1 drop into both eyes at bedtime. 02/06/21   [provider]  carvedilol (COREG) 3.125 MG tablet Take 1 tablet (3.125 mg total) by mouth daily with supper. 06/20/23   Janine Ores K, PA-C  carvedilol (COREG) 6.25 MG tablet Take 6.25 mg by mouth in the morning.    [provider]  Cholecalciferol (VITAMIN D3 PO) Take 1 tablet by mouth daily.    [provider]  cloNIDine (CATAPRES) 0.1 MG tablet Take 1 tablet (0.1 mg total) by mouth 2 (two) times daily as needed (for blood pressure above 180 when she is SEATEd--not lying down). 01/14/22 05/30/23  Rhetta Mura, MD  COVID-19 mRNA vaccine, Moderna, 100 MCG/0.5ML injection Inject into the muscle. Patient not taking: Reported on 05/30/2023 08/30/21   Judyann Munson, MD  diclofenac Sodium (VOLTAREN) 1 % GEL Apply 2 g topically daily as needed (pain).    [provider]  diltiazem (CARDIZEM CD) 180 MG 24 hr capsule Take 1 capsule (180 mg total) by mouth daily. 01/15/22   Karie Soda, MD  diltiazem (CARDIZEM LA) 180 MG 24 hr tablet Take 180 mg by mouth daily.    [provider]  fenofibrate (TRICOR) 145 MG tablet Take 145 mg by mouth daily.    [provider]  HUMIRA PEN 40 MG/0.4ML PNKT Inject 40 mg into the skin every 14 (fourteen) days. 01/22/21  [provider]   ketorolac (ACULAR) 0.5 % ophthalmic solution Place 1 drop into both eyes in the morning, at noon, and at bedtime. Verified correct strength per Ophthalmalogist progress notes    [provider]  levothyroxine (SYNTHROID, LEVOTHROID) 50 MCG tablet Take 50 mcg by mouth daily before breakfast.    [provider]  lidocaine (LIDODERM) 5 % Place 1 patch onto the skin daily as needed (pain). 04/14/23   [provider]  omeprazole (PRILOSEC) 20 MG capsule Take 20 mg by mouth daily.    [provider]  ondansetron (ZOFRAN) 4 MG tablet Take 1 tablet (4 mg total) by mouth every 8 (eight) hours as needed for nausea or vomiting. 06/19/23   Armida Sans, PA-C  Plecanatide (TRULANCE) 3 MG TABS Take 3 mg by mouth daily as needed (constipation).    [provider]  polyethylene glycol (MIRALAX / GLYCOLAX) 17 g packet Take 17 g by mouth daily as needed for mild constipation.    [provider]  rosuvastatin (CRESTOR) 20 MG tablet TAKE 1 TABLET BY MOUTH  DAILY AT 6 PM. 07/08/22   Marykay Lex, MD  sennosides-docusate sodium (SENOKOT-S) 8.6-50 MG tablet Take 2 tablets by mouth daily. 06/19/23   Armida Sans, PA-C  traMADol (ULTRAM) 50 MG tablet Take 1 tablet (50 mg total) by mouth every 4 (four) hours as needed for moderate pain or severe pain. 06/19/23   Janine Ores K, PA-C  valsartan (DIOVAN) 80 MG tablet Take 1 tablet (80 mg total) by mouth daily. Patient taking differently: Take 80 mg by mouth daily as needed (blood pressure above 180). 03/13/23   Ronney Asters, NP    Physical Exam: Vitals:   07/04/23 1336 07/04/23 1400  BP: 103/60 (!) 107/59  Pulse: 69 66  Resp: 18 17  Temp: 98.3 F (36.8 C)   TempSrc: Oral   SpO2: 99% 99%   Physical Exam Vitals and nursing note reviewed.  HENT:     Head: Normocephalic.     Mouth/Throat:     Mouth: Mucous membranes are dry.  Eyes:     General: No scleral icterus.    Pupils: Pupils are equal, round,  and reactive to light.  Cardiovascular:     Rate and Rhythm: Normal rate and regular rhythm.  Pulmonary:     Effort: Pulmonary effort is normal.     Breath sounds: Normal breath sounds.  Abdominal:     General: Bowel sounds are normal.     Palpations: Abdomen is soft.  Musculoskeletal:     Cervical back: Neck supple.     Right lower leg: No edema.     Left lower leg: No edema.  Skin:    General: Skin is warm and dry.  Neurological:     General: No focal deficit present.     Mental Status: She is alert and oriented to person, place, and time.  Psychiatric:        Mood and Affect: Mood normal.        Behavior: Behavior normal.     Data Reviewed:  Results are pending, will review when available.  04/14/2023 TTE without enhancing agent. IMPRESSIONS:   1. Left ventricular ejection fraction, by estimation, is 60 to 65%. The  left ventricle has normal function. The left ventricle has no regional  wall motion abnormalities. Left ventricular diastolic parameters are  consistent with Grade I diastolic  dysfunction (impaired relaxation).   2. Right ventricular systolic function is  normal. The right ventricular  size is normal. There is normal pulmonary artery systolic pressure.   3. Left atrial size was mildly dilated.   4. The mitral valve is normal in structure. Trivial mitral valve  regurgitation. No evidence of mitral stenosis.   5. The aortic valve is tricuspid. There is mild calcification of the  aortic valve. There is mild thickening of the aortic valve. Aortic valve  regurgitation is mild to moderate. No aortic stenosis is present.   6. The inferior vena cava is normal in size with greater than 50%  respiratory variability, suggesting right atrial pressure of 3 mmHg.   EKG: Vent. rate 64 BPM PR interval 170 ms QRS duration 119 ms QT/QTcB 452/467 ms P-R-T axes 1 -67 -28 Sinus rhythm Left anterior fascicular block Probable left ventricular hypertrophy Nonspecific T  abnormalities, diffuse leads  Assessment and Plan: Principal Problem:   AKI (acute kidney injury) (HCC) Superimposed on:   Chronic kidney disease, stage 3b (HCC) Observation/telemetry. Continue IV fluids. Hold ARB/ACE. Avoid hypotension. Avoid nephrotoxins. Monitor intake and output. Monitor renal function electrolytes.  Active Problems:   Chronic constipation   Recent weight loss   Hematochezia   Acute on chronic blood loss anemia Monitor hematocrit and hemoglobin. Transfuse as needed. [facility] will see tomorrow.    Grade I diastolic dysfunction Patient is dehydrated. Holding carvedilol and valsartan.    Essential hypertension Holding antihypertensives due to soft BP readings.    Hypothyroidism Continue levothyroxine 50 mcg p.o. daily.    Hyperlipidemia LDL goal <100   Atherosclerosis of aorta (HCC) Check total CK level before restarting.    Hernia, hiatal   GERD (gastroesophageal reflux disease) Continue omeprazole or formulary PPI    Type 2 diabetes mellitus (HCC) Diet controlled. Carbohydrate modified diet. CBG monitoring with RI SS. Check hemoglobin A1c.    Obstructive sleep apnea Not on CPAP.    Thrombocytosis In the setting of acute on chronic blood loss anemia.     Advance Care Planning:   Code Status: Full Code   Consults: [facility] Vida Rigger MD).  Family Communication:   Severity of Illness: The appropriate patient status for this patient is OBSERVATION. Observation status is judged to be reasonable and necessary in order to provide the required intensity of service to ensure the patient's safety. The patient's presenting symptoms, physical exam findings, and initial radiographic and laboratory data in the context of their medical condition is felt to place them at decreased risk for further clinical deterioration. Furthermore, it is anticipated that the patient will be medically stable for discharge from the hospital within 2 midnights of  admission.   Author: Bobette Mo, MD 07/04/2023 4:34 PM  For on call review [email or web address].   This document was prepared using Dragon voice recognition software and may contain some unintended transcription errors.

## 2023-07-04 NOTE — ED Triage Notes (Signed)
BIBA from [facility] for suspected dehydration. 110/60 BP 68 HR 16 RR 98% room air

## 2023-07-04 NOTE — ED Notes (Signed)
Unsuccessful IV attempt x2.  

## 2023-07-04 NOTE — Progress Notes (Signed)
Left lower extremity venous study completed.   Preliminary results relayed to MD and RN.  Please see CV Procedures for preliminary results.   , RVT  2:52 PM 07/04/23

## 2023-07-04 NOTE — ED Notes (Signed)
ED TO INPATIENT HANDOFF REPORT  Name/Age/Gender Tricia Harrison 77 y.o. female  Code Status Code Status History     Date Active Date Inactive Code Status Order ID Comments User Context   06/17/2023 1134 06/20/2023 1706 Full Code [id number]  Annita Brod Inpatient   01/10/2022 1050 01/15/2022 1705 Full Code [id number]  Tricia Soda, MD Inpatient   08/18/2018 2019 08/23/2018 1510 Full Code [id number]  Tricia Harrison Inpatient   02/21/2014 1553 02/22/2014 1912 Full Code [id number]  Tricia Lux, PA-C Inpatient    Questions for Most Recent Historical Code Status (Order [id number])     Question Answer   By: Other            Home/SNF/Other Nursing Home  Chief Complaint AKI (acute kidney injury) (HCC) [N17.9]  Level of Care/Admitting Diagnosis ED Disposition     ED Disposition  Admit   Condition  --   Comment  Hospital Area: [facility] [[id number]]  Level of Care: Telemetry [5]  Admit to tele based on following criteria: Monitor QTC interval  May place patient in observation at [facility] or [facility] if equivalent level of care is available:: No  Covid Evaluation: Asymptomatic - no recent exposure (last 10 days) testing not required  Diagnosis: AKI (acute kidney injury) [facility]) [[id number]]  Admitting Physician: Bobette Mo [[id number]]  Attending Physician: Bobette Mo [[id number]]          Medical History Past Medical History:  Diagnosis Date   Anemia    as a child   Anginal pain (HCC)    Asthma    as a child   Bladder incontinence    Chronic back pain    spinal stenosis and buldging disc;scoliosis   Chronic constipation    occasionally takes something OTC   Chronic kidney disease    Diabetes mellitus without complication (HCC)    per pt "Pre" for 20+yrs   Diverticulosis    DVT (deep venous thrombosis) (HCC)    Dyspnea    occasional   Gallstones    has known about this for 3-25yrs   GERD  (gastroesophageal reflux disease)    takes Omeprazole daily   Glaucoma    H/O hiatal hernia    Heart murmur    Hemorrhoids    History of blood transfusion    no abnormal reaction noted   History of bronchitis    states its been a long time ago   History of gout    HLD (hyperlipidemia)    takes Fenofibrate daily   HTN (hypertension)    takes Diltiazem and Ramipril daily   Hypothyroidism    takes Synthroid daily   Left knee DJD    Macular degeneration    Nocturia    Palpitations    started in 2013 and only occasionally;last time noticed about 2-3wks ago and after drinking caffeine   Peripheral vascular disease (HCC)    PONV (postoperative nausea and vomiting)    problems swallowing after anethesia- 2/2023and slurred speech also   Pulmonary embolism with acute cor pulmonale (HCC) 07/2018   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   Sleep apnea    does not uses cpap    Allergies Allergies  Allergen Reactions   Atorvastatin Other (See Comments)    Muscle pain   Codeine Nausea And Vomiting   Morphine And Codeine     B/p  drops     IV Location/Drains/Wounds Patient Lines/Drains/Airways Status     Active Line/Drains/Airways     Name Placement date Placement time Site Days   Peripheral IV 07/04/23 20 G Anterior;Left;Proximal Forearm 07/04/23  1514  Forearm  less than 1   Incision - 4 Ports Abdomen Umbilicus Mid;Superior Right;Lateral Right;Lateral;Upper 01/10/22  0810  -- 540            Labs/Imaging Results for orders placed or performed during the hospital encounter of 07/04/23 (from the past 48 hour(s))  CBC with Differential/Platelet     Status: Abnormal   Collection Time: 07/04/23  3:11 PM  Result Value Ref Range   WBC 9.6 4.0 - 10.5 K/uL   RBC 2.92 (L) 3.87 - 5.11 MIL/uL   Hemoglobin 8.9 (L) 12.0 - 15.0 g/dL   HCT 13.2 (L) 44.0 - 10.2 %   MCV 95.2 80.0 - 100.0 fL   MCH 30.5 26.0 - 34.0 pg    MCHC 32.0 30.0 - 36.0 g/dL   RDW 72.5 36.6 - 44.0 %   Platelets 539 (H) 150 - 400 K/uL   nRBC 0.0 0.0 - 0.2 %   Neutrophils Relative % 62 %   Neutro Abs 6.0 1.7 - 7.7 K/uL   Lymphocytes Relative 12 %   Lymphs Abs 1.1 0.7 - 4.0 K/uL   Monocytes Relative 24 %   Monocytes Absolute 2.3 (H) 0.1 - 1.0 K/uL   Eosinophils Relative 1 %   Eosinophils Absolute 0.1 0.0 - 0.5 K/uL   Basophils Relative 0 %   Basophils Absolute 0.0 0.0 - 0.1 K/uL   Immature Granulocytes 1 %   Abs Immature Granulocytes 0.07 0.00 - 0.07 K/uL    Comment: Performed at [facility], [address] [place], [place] [address]  Comprehensive metabolic panel     Status: Abnormal   Collection Time: 07/04/23  3:11 PM  Result Value Ref Range   Sodium 133 (L) 135 - 145 mmol/L   Potassium 4.3 3.5 - 5.1 mmol/L   Chloride 106 98 - 111 mmol/L   CO2 18 (L) 22 - 32 mmol/L   Glucose, Bld 135 (H) 70 - 99 mg/dL    Comment: Glucose reference range applies only to samples taken after fasting for at least 8 hours.   BUN 77 (H) 8 - 23 mg/dL   Creatinine, Ser 5.95 (H) 0.44 - 1.00 mg/dL   Calcium 8.1 (L) 8.9 - 10.3 mg/dL   Total Protein 5.9 (L) 6.5 - 8.1 g/dL   Albumin 2.3 (L) 3.5 - 5.0 g/dL   AST 35 15 - 41 U/L   ALT 15 0 - 44 U/L   Alkaline Phosphatase 47 38 - 126 U/L   Total Bilirubin 0.5 0.3 - 1.2 mg/dL   GFR, Estimated 18 (L) >60 mL/min    Comment: (NOTE) Calculated using the CKD-EPI Creatinine Equation (2021)    Anion gap 9 5 - 15    Comment: Performed at [facility], [address] [place], [place] [address]   VAS [place] LOWER EXTREMITY VENOUS (DVT) (7a-7p)  Result Date: 07/04/2023  Lower Venous DVT Study Patient Name:  Tricia Harrison  Date of Exam:   07/04/2023 Medical Rec #: [id number]             Accession #:    [id number] Date of Birth: Mar 24, 1946             Patient Gender: F Patient Age:   62 years  Exam Location:  [facility] Procedure:      VAS [place] LOWER EXTREMITY VENOUS  (DVT) Referring Phys: Vanetta Mulders --------------------------------------------------------------------------------  Indications: Hx of DVT, shoulder surgery 2 weeks ago, increased swelling in thigh and lateral calf.  Anticoagulation: Eliquis. Limitations: Body habitus and poor ultrasound/tissue interface. Comparison Study: Previous 08/21/18 age indeterminate DVT Performing Technologist: McKayla Maag RVT, [place]  Examination Guidelines: A complete evaluation includes B-mode imaging, spectral Doppler, color Doppler, and power Doppler as needed of all accessible portions of each vessel. Bilateral testing is considered an integral part of a complete examination. Limited examinations for reoccurring indications may be performed as noted. The reflux portion of the exam is performed with the patient in reverse Trendelenburg.  +-----+---------------+---------+-----------+----------+--------------+ RIGHTCompressibilityPhasicitySpontaneityPropertiesThrombus Aging +-----+---------------+---------+-----------+----------+--------------+ CFV  Full           Yes      Yes                                 +-----+---------------+---------+-----------+----------+--------------+ SFJ  Full                                                        +-----+---------------+---------+-----------+----------+--------------+   +--------+---------------+---------+-----------+----------+--------------------+ LEFT    CompressibilityPhasicitySpontaneityPropertiesThrombus Aging       +--------+---------------+---------+-----------+----------+--------------------+ CFV     Full           Yes      Yes                                       +--------+---------------+---------+-----------+----------+--------------------+ SFJ     Full                                                              +--------+---------------+---------+-----------+----------+--------------------+ FV Prox Full                                                               +--------+---------------+---------+-----------+----------+--------------------+ FV Mid  Full                                                              +--------+---------------+---------+-----------+----------+--------------------+ FV                     Yes      Yes                  patent by color      Distal  doppler              +--------+---------------+---------+-----------+----------+--------------------+ PFV     Full                                                              +--------+---------------+---------+-----------+----------+--------------------+ POP     Full           Yes      Yes                                       +--------+---------------+---------+-----------+----------+--------------------+ PTV     Full                                                              +--------+---------------+---------+-----------+----------+--------------------+ PERO    Full                                                              +--------+---------------+---------+-----------+----------+--------------------+     Summary: RIGHT: - No evidence of common femoral vein obstruction.  LEFT: - There is no evidence of deep vein thrombosis in the lower extremity. However, portions of this examination were limited- see technologist comments above.  - No cystic structure found in the popliteal fossa.  *See table(s) above for measurements and observations. Electronically signed by Gerarda Fraction on 07/04/2023 at 3:40:36 PM.    Final    DG Chest Port 1 View  Result Date: 07/04/2023 CLINICAL DATA:  77 year old female with history of DVT EXAM: PORTABLE CHEST 1 VIEW COMPARISON:  11/23/2018 FINDINGS: Left rotation of the patient. Cardiomediastinal silhouette borderline enlarged in size and contour. No evidence of central vascular congestion. No interlobular septal thickening. Low lung  volumes. No pneumothorax or pleural effusion. Coarsened interstitial markings, with no confluent airspace disease. Interval surgical changes of the right shoulder. Degenerative changes of the left GH joint. Scoliotic curvature. IMPRESSION: Low lung volumes without evidence of acute cardiopulmonary disease. Electronically Signed   By: Gilmer Mor D.O.   On: 07/04/2023 15:27    Pending Labs Unresulted Labs (From admission, onward)    None       Vitals/Pain Today's Vitals   07/04/23 1336 07/04/23 1400  BP: 103/60 (!) 107/59  Pulse: 69 66  Resp: 18 17  Temp: 98.3 F (36.8 C)   TempSrc: Oral   SpO2: 99% 99%  PainSc: 0-No pain     Isolation Precautions No active isolations  Medications Medications  0.9 %  sodium chloride infusion ( Intravenous New Bag/Given 07/04/23 1517)  sodium chloride 0.9 % bolus 500 mL (0 mLs Intravenous Stopped 07/04/23 1614)    Mobility non-ambulatory

## 2023-07-04 NOTE — ED Provider Notes (Addendum)
[place] EMERGENCY DEPARTMENT AT [facility] Provider Note   CSN: [id number] Arrival date & time: 07/04/23  1310     History  Chief Complaint  Patient presents with   Dehydration    Tricia Harrison is a 77 y.o. female.  Patient status post a reverse shoulder arthroplasty done July 23.  Went to rehab following that.  Has her right shoulder and shoulder immobilizer.  Patient sent in today because was rehab [facility] suspects dehydration.  They did give her a subcutaneous bolus of fluid a couple days ago.  They say her renal functions getting worse.  Patient not eating or drinking well.  Patient with a little bit of blood in her stool but no large rectal bleeding.  Patient is a former smoker quit in 1995.  Denies any chest pain shortness of breath any abdominal pain nausea vomiting or diarrhea.  Past medical history significant for hyperlipidemia hypothyroidism hypertension chronic back pain diabetes pulmonary embolism in 2019.  DVT known peripheral vascular disease.       Home Medications Prior to Admission medications   Medication Sig Start Date End Date Taking? Authorizing Provider  acetaminophen (TYLENOL) 650 MG CR tablet Take 650 mg by mouth every 8 (eight) hours as needed for pain.    [provider]  albuterol (VENTOLIN HFA) 108 (90 Base) MCG/ACT inhaler Inhale 2 puffs into the lungs every 6 (six) hours as needed. 07/02/19   Coral Ceo, NP  apixaban (ELIQUIS) 5 MG TABS tablet TAKE 1 TABLET BY MOUTH TWICE  DAILY 04/01/23   Marykay Lex, MD  bimatoprost (LUMIGAN) 0.01 % SOLN Place 1 drop into both eyes at bedtime. 02/06/21   [provider]  carvedilol (COREG) 3.125 MG tablet Take 1 tablet (3.125 mg total) by mouth daily with supper. 06/20/23   Janine Ores K, PA-C  carvedilol (COREG) 6.25 MG tablet Take 6.25 mg by mouth in the morning.    [provider]  Cholecalciferol (VITAMIN D3 PO) Take 1 tablet by mouth daily.    [provider]  cloNIDine (CATAPRES) 0.1 MG tablet Take 1 tablet (0.1 mg total) by mouth 2 (two) times daily as needed (for blood pressure above 180 when she is SEATEd--not lying down). 01/14/22 05/30/23  Rhetta Mura, MD  COVID-19 mRNA vaccine, Moderna, 100 MCG/0.5ML injection Inject into the muscle. Patient not taking: Reported on 05/30/2023 08/30/21   Judyann Munson, MD  diclofenac Sodium (VOLTAREN) 1 % GEL Apply 2 g topically daily as needed (pain).    [provider]  diltiazem (CARDIZEM CD) 180 MG 24 hr capsule Take 1 capsule (180 mg total) by mouth daily. 01/15/22   Karie Soda, MD  diltiazem (CARDIZEM LA) 180 MG 24 hr tablet Take 180 mg by mouth daily.    [provider]  fenofibrate (TRICOR) 145 MG tablet Take 145 mg by mouth daily.    [provider]  HUMIRA PEN 40 MG/0.4ML PNKT Inject 40 mg into the skin every 14 (fourteen) days. 01/22/21   [provider]  ketorolac (ACULAR) 0.5 % ophthalmic solution Place 1 drop into both eyes in the morning, at noon, and at bedtime. Verified correct strength per Ophthalmalogist progress notes    [provider]  levothyroxine (SYNTHROID, LEVOTHROID) 50 MCG tablet Take 50 mcg by mouth daily before breakfast.    [provider]  lidocaine (LIDODERM) 5 % Place 1 patch onto the skin daily as needed (pain). 04/14/23   [provider]  omeprazole (PRILOSEC) 20 MG capsule Take 20 mg by mouth daily.    [provider]  ondansetron (ZOFRAN) 4 MG tablet Take 1 tablet (4 mg total) by mouth every 8 (eight) hours as needed for nausea or vomiting. 06/19/23   Armida Sans, PA-C  Plecanatide (TRULANCE) 3 MG TABS Take 3 mg by mouth daily as needed (constipation).    [provider]  polyethylene glycol (MIRALAX / GLYCOLAX) 17 g packet Take 17 g by mouth daily as needed for mild constipation.    [provider]  rosuvastatin (CRESTOR) 20 MG tablet TAKE 1 TABLET BY MOUTH  DAILY  AT 6 PM. 07/08/22   Marykay Lex, MD  sennosides-docusate sodium (SENOKOT-S) 8.6-50 MG tablet Take 2 tablets by mouth daily. 06/19/23   Armida Sans, PA-C  traMADol (ULTRAM) 50 MG tablet Take 1 tablet (50 mg total) by mouth every 4 (four) hours as needed for moderate pain or severe pain. 06/19/23   Janine Ores K, PA-C  valsartan (DIOVAN) 80 MG tablet Take 1 tablet (80 mg total) by mouth daily. Patient taking differently: Take 80 mg by mouth daily as needed (blood pressure above 180). 03/13/23   Ronney Asters, NP      Allergies    Atorvastatin, Codeine, and Morphine and codeine    Review of Systems   Review of Systems  Constitutional:  Positive for appetite change. Negative for chills and fever.  HENT:  Negative for ear pain and sore throat.   Eyes:  Negative for pain and visual disturbance.  Respiratory:  Negative for cough and shortness of breath.   Cardiovascular:  Positive for leg swelling. Negative for chest pain and palpitations.  Gastrointestinal:  Positive for blood in stool. Negative for abdominal pain and vomiting.  Genitourinary:  Negative for dysuria and hematuria.  Musculoskeletal:  Negative for arthralgias and back pain.  Skin:  Negative for color change and rash.  Neurological:  Negative for seizures and syncope.  All other systems reviewed and are negative.   Physical Exam Updated Vital Signs BP (!) 107/59   Pulse 66   Temp 98.3 F (36.8 C) (Oral)   Resp 17   SpO2 99%  Physical Exam Vitals and nursing note reviewed.  Constitutional:      General: She is not in acute distress.    Appearance: Normal appearance. She is well-developed.  HENT:     Head: Normocephalic and atraumatic.  Eyes:     Extraocular Movements: Extraocular movements intact.     Conjunctiva/sclera: Conjunctivae normal.     Pupils: Pupils are equal, round, and reactive to light.  Cardiovascular:     Rate and Rhythm: Normal rate and regular rhythm.     Heart sounds: No murmur  heard. Pulmonary:     Effort: Pulmonary effort is normal. No respiratory distress.     Breath sounds: Normal breath sounds.  Abdominal:     Palpations: Abdomen is soft.     Tenderness: There is no abdominal tenderness.  Musculoskeletal:        General: No swelling or tenderness.     Cervical back: Normal range of motion and neck supple.     Left lower leg: Edema present.     Comments: Status post surgery to her right shoulder.  Patient in a shoulder immobilizer.  Skin:    General: Skin is warm and dry.     Capillary Refill: Capillary refill takes less than 2 seconds.  Neurological:     General:  No focal deficit present.     Mental Status: She is alert and oriented to person, place, and time.  Psychiatric:        Mood and Affect: Mood normal.     ED Results / Procedures / Treatments   Labs (all labs ordered are listed, but only abnormal results are displayed) Labs Reviewed  CBC WITH DIFFERENTIAL/PLATELET - Abnormal; Notable for the following components:      Result Value   RBC 2.92 (*)    Hemoglobin 8.9 (*)    HCT 27.8 (*)    Platelets 539 (*)    Monocytes Absolute 2.3 (*)    All other components within normal limits  COMPREHENSIVE METABOLIC PANEL - Abnormal; Notable for the following components:   Sodium 133 (*)    CO2 18 (*)    Glucose, Bld 135 (*)    BUN 77 (*)    Creatinine, Ser 2.70 (*)    Calcium 8.1 (*)    Total Protein 5.9 (*)    Albumin 2.3 (*)    GFR, Estimated 18 (*)    All other components within normal limits    EKG EKG Interpretation Date/Time:  Friday July 04 2023 15:13:14 EDT Ventricular Rate:  64 PR Interval:  170 QRS Duration:  119 QT Interval:  452 QTC Calculation: 467 R Axis:   -67  Text Interpretation: Sinus rhythm Left anterior fascicular block Probable left ventricular hypertrophy Nonspecific T abnormalities, diffuse leads Confirmed by Vanetta Mulders [id number]) on 07/04/2023 3:26:25 PM  Radiology VAS [place] LOWER EXTREMITY VENOUS (DVT)  (7a-7p)  Result Date: 07/04/2023  Lower Venous DVT Study Patient Name:  [place] ANN Gunner  Date of Exam:   07/04/2023 Medical Rec #: [id number]             Accession #:    [id number] Date of Birth: 09/19/1946             Patient Gender: F Patient Age:   53 years Exam Location[facility]  [facility] Procedure:      VAS [place] LOWER EXTREMITY VENOUS (DVT) Referring Phys: Vanetta Mulders --------------------------------------------------------------------------------  Indications: Hx of DVT, shoulder surgery 2 weeks ago, increased swelling in thigh and lateral calf.  Anticoagulation: Eliquis. Limitations: Body habitus and poor ultrasound/tissue interface. Comparison Study: Previous 08/21/18 age indeterminate DVT Performing Technologist: McKayla Maag RVT, VT  Examination Guidelines: A complete evaluation includes B-mode imaging, spectral Doppler, color Doppler, and power Doppler as needed of all accessible portions of each vessel. Bilateral testing is considered an integral part of a complete examination. Limited examinations for reoccurring indications may be performed as noted. The reflux portion of the exam is performed with the patient in reverse Trendelenburg.  +-----+---------------+---------+-----------+----------+--------------+ RIGHTCompressibilityPhasicitySpontaneityPropertiesThrombus Aging +-----+---------------+---------+-----------+----------+--------------+ CFV  Full           Yes      Yes                                 +-----+---------------+---------+-----------+----------+--------------+ SFJ  Full                                                        +-----+---------------+---------+-----------+----------+--------------+   +--------+---------------+---------+-----------+----------+--------------------+ LEFT    CompressibilityPhasicitySpontaneityPropertiesThrombus Aging       +--------+---------------+---------+-----------+----------+--------------------+ CFV     Full  Yes      Yes                                       +--------+---------------+---------+-----------+----------+--------------------+ SFJ     Full                                                              +--------+---------------+---------+-----------+----------+--------------------+ FV Prox Full                                                              +--------+---------------+---------+-----------+----------+--------------------+ FV Mid  Full                                                              +--------+---------------+---------+-----------+----------+--------------------+ FV                     Yes      Yes                  patent by color      Distal                                               doppler              +--------+---------------+---------+-----------+----------+--------------------+ PFV     Full                                                              +--------+---------------+---------+-----------+----------+--------------------+ POP     Full           Yes      Yes                                       +--------+---------------+---------+-----------+----------+--------------------+ PTV     Full                                                              +--------+---------------+---------+-----------+----------+--------------------+ PERO    Full                                                              +--------+---------------+---------+-----------+----------+--------------------+  Summary: RIGHT: - No evidence of common femoral vein obstruction.  LEFT: - There is no evidence of deep vein thrombosis in the lower extremity. However, portions of this examination were limited- see technologist comments above.  - No cystic structure found in the popliteal fossa.  *See table(s) above for measurements and observations. Electronically signed by Gerarda Fraction on 07/04/2023 at 3:40:36 PM.    Final    DG Chest Port  1 View  Result Date: 07/04/2023 CLINICAL DATA:  77 year old female with history of DVT EXAM: PORTABLE CHEST 1 VIEW COMPARISON:  11/23/2018 FINDINGS: Left rotation of the patient. Cardiomediastinal silhouette borderline enlarged in size and contour. No evidence of central vascular congestion. No interlobular septal thickening. Low lung volumes. No pneumothorax or pleural effusion. Coarsened interstitial markings, with no confluent airspace disease. Interval surgical changes of the right shoulder. Degenerative changes of the left GH joint. Scoliotic curvature. IMPRESSION: Low lung volumes without evidence of acute cardiopulmonary disease. Electronically Signed   By: Gilmer Mor D.O.   On: 07/04/2023 15:27    Procedures Procedures    Medications Ordered in ED Medications  0.9 %  sodium chloride infusion ( Intravenous New Bag/Given 07/04/23 1517)  sodium chloride 0.9 % bolus 500 mL (0 mLs Intravenous Stopped 07/04/23 1614)    ED Course/ Medical Decision Making/ A&P                                 Medical Decision Making Amount and/or Complexity of Data Reviewed Labs: ordered. Radiology: ordered.  Risk Prescription drug management. Decision regarding hospitalization.   Will give fluids gently.  Will check CBC complete metabolic panel will get chest x-ray.  Also will get Doppler studies of the left lower extremity to rule out DVT.  However patient is actively taking Eliquis.  Some history of little bit of blood in her bowel movements.  But no history of any significant bleeding.  Patient abdomen is soft and nontender.  Patient's been on Eliquis since May of this year.   Doppler studies negative for DVT.  CBC no leukocytosis hemoglobin 8.9 not significantly changed platelets 539.  Complete metabolic panel CO2 is 18 glucose 135 sodium 133 potassium good renal function GFR is 18 with a creatinine of 2.70.  This is almost double worse than she normally is anion gap is normal.  Chest x-ray low lung  volumes without evidence of any acute disease.  Patient's hemoglobin is stable at 8.9.  So no significant change there despite the history of some slight rectal blood none today.  This seems to fit what the rehab facility was worried about was that she had worsening kidney function and dehydration.  Patient ordered some IV fluids here.  Will contact hospitalist for admission her rehab facility is not able to do IV medicine.       Final Clinical Impression(s) / ED Diagnoses Final diagnoses:  AKI (acute kidney injury) [facility])    Rx / DC Orders ED Discharge Orders     None         Vanetta Mulders, MD 07/04/23 1423    Vanetta Mulders, MD 07/04/23 1615    Vanetta Mulders, MD 07/04/23 [phone]

## 2023-07-04 NOTE — Care Plan (Signed)
Pt arrived to 1608. A&O, resting comfortably. Pt educated to use call light and call light within reach.

## 2023-07-05 ENCOUNTER — Encounter (HOSPITAL_COMMUNITY): Payer: Self-pay | Admitting: Gastroenterology

## 2023-07-05 DIAGNOSIS — I69354 Hemiplegia and hemiparesis following cerebral infarction affecting left non-dominant side: Secondary | ICD-10-CM | POA: Diagnosis not present

## 2023-07-05 DIAGNOSIS — E1151 Type 2 diabetes mellitus with diabetic peripheral angiopathy without gangrene: Secondary | ICD-10-CM | POA: Diagnosis present

## 2023-07-05 DIAGNOSIS — E1122 Type 2 diabetes mellitus with diabetic chronic kidney disease: Secondary | ICD-10-CM | POA: Diagnosis present

## 2023-07-05 DIAGNOSIS — F0390 Unspecified dementia without behavioral disturbance: Secondary | ICD-10-CM | POA: Diagnosis present

## 2023-07-05 DIAGNOSIS — K5731 Diverticulosis of large intestine without perforation or abscess with bleeding: Secondary | ICD-10-CM | POA: Diagnosis present

## 2023-07-05 DIAGNOSIS — N170 Acute kidney failure with tubular necrosis: Secondary | ICD-10-CM | POA: Diagnosis present

## 2023-07-05 DIAGNOSIS — J45909 Unspecified asthma, uncomplicated: Secondary | ICD-10-CM | POA: Diagnosis present

## 2023-07-05 DIAGNOSIS — I272 Pulmonary hypertension, unspecified: Secondary | ICD-10-CM | POA: Diagnosis present

## 2023-07-05 DIAGNOSIS — F05 Delirium due to known physiological condition: Secondary | ICD-10-CM | POA: Diagnosis present

## 2023-07-05 DIAGNOSIS — N179 Acute kidney failure, unspecified: Secondary | ICD-10-CM | POA: Diagnosis not present

## 2023-07-05 DIAGNOSIS — E039 Hypothyroidism, unspecified: Secondary | ICD-10-CM | POA: Diagnosis present

## 2023-07-05 DIAGNOSIS — G4733 Obstructive sleep apnea (adult) (pediatric): Secondary | ICD-10-CM | POA: Diagnosis present

## 2023-07-05 DIAGNOSIS — E1136 Type 2 diabetes mellitus with diabetic cataract: Secondary | ICD-10-CM | POA: Diagnosis present

## 2023-07-05 DIAGNOSIS — I2510 Atherosclerotic heart disease of native coronary artery without angina pectoris: Secondary | ICD-10-CM | POA: Diagnosis present

## 2023-07-05 DIAGNOSIS — E11649 Type 2 diabetes mellitus with hypoglycemia without coma: Secondary | ICD-10-CM | POA: Diagnosis not present

## 2023-07-05 DIAGNOSIS — D62 Acute posthemorrhagic anemia: Secondary | ICD-10-CM | POA: Diagnosis present

## 2023-07-05 DIAGNOSIS — D75839 Thrombocytosis, unspecified: Secondary | ICD-10-CM | POA: Diagnosis present

## 2023-07-05 DIAGNOSIS — I69392 Facial weakness following cerebral infarction: Secondary | ICD-10-CM | POA: Diagnosis not present

## 2023-07-05 DIAGNOSIS — N136 Pyonephrosis: Secondary | ICD-10-CM | POA: Diagnosis not present

## 2023-07-05 DIAGNOSIS — E860 Dehydration: Secondary | ICD-10-CM | POA: Diagnosis present

## 2023-07-05 DIAGNOSIS — I129 Hypertensive chronic kidney disease with stage 1 through stage 4 chronic kidney disease, or unspecified chronic kidney disease: Secondary | ICD-10-CM | POA: Diagnosis present

## 2023-07-05 DIAGNOSIS — E785 Hyperlipidemia, unspecified: Secondary | ICD-10-CM | POA: Diagnosis present

## 2023-07-05 DIAGNOSIS — K649 Unspecified hemorrhoids: Secondary | ICD-10-CM | POA: Diagnosis not present

## 2023-07-05 DIAGNOSIS — I700 Atherosclerosis of aorta: Secondary | ICD-10-CM | POA: Diagnosis present

## 2023-07-05 DIAGNOSIS — N1832 Chronic kidney disease, stage 3b: Secondary | ICD-10-CM | POA: Diagnosis present

## 2023-07-05 DIAGNOSIS — E669 Obesity, unspecified: Secondary | ICD-10-CM | POA: Diagnosis present

## 2023-07-05 DIAGNOSIS — K5730 Diverticulosis of large intestine without perforation or abscess without bleeding: Secondary | ICD-10-CM | POA: Diagnosis not present

## 2023-07-05 DIAGNOSIS — Z87891 Personal history of nicotine dependence: Secondary | ICD-10-CM | POA: Diagnosis not present

## 2023-07-05 LAB — GLUCOSE, CAPILLARY
Glucose-Capillary: 76 mg/dL (ref 70–99)
Glucose-Capillary: 89 mg/dL (ref 70–99)
Glucose-Capillary: 101 mg/dL — ABNORMAL HIGH (ref 70–99)
Glucose-Capillary: 84 mg/dL (ref 70–99)

## 2023-07-05 LAB — URINALYSIS, ROUTINE W REFLEX MICROSCOPIC
Bilirubin Urine: NEGATIVE
Glucose, UA: NEGATIVE mg/dL
Ketones, ur: NEGATIVE mg/dL
Nitrite: NEGATIVE
Protein, ur: 100 mg/dL — AB
RBC / HPF: >50 RBC/hpf (ref 0–5)
Specific Gravity, Urine: 1.009 (ref 1.005–1.030)
WBC, UA: >50 WBC/hpf (ref 0–5)
pH: 8 (ref 5.0–8.0)

## 2023-07-05 LAB — C DIFFICILE QUICK SCREEN W PCR REFLEX
C Diff antigen: NEGATIVE
C Diff interpretation: NOT DETECTED
C Diff toxin: NEGATIVE

## 2023-07-05 LAB — TYPE AND SCREEN
ABO/RH(D): A POS
Antibody Screen: NEGATIVE

## 2023-07-05 NOTE — Evaluation (Signed)
Physical Therapy Evaluation Patient Details Name: Tricia Harrison MRN: [id number] DOB: Dec 28, 1945 Today's Date: 07/05/2023  History of Present Illness  Patient is a 77 year old female sent from her rehab facility due to decreased oral intake, several episodes of nausea and emesis earlier this week.  She has also been having hematochezia at the SNF. Pt recently S/P R reverse TSA on 06/17/23 and discharged to SNF.  PMH: DVT,CAD,CKD, HTN, LTKA, DM, PE  Clinical Impression  Pt admitted with above diagnosis.  Pt currently with functional limitations due to the deficits listed below (see PT Problem List). Pt will benefit from acute skilled PT to increase their independence and safety with mobility to allow discharge.  Pt admitted from SNF for rehab after R reverse TSA.  Pt was +2 assist and Stedy equipment utilized post surgery in hospital.  Facility was using hoyer lift for OOB.  Pt appears to not have been able to progress mobility well since discharging to SNF.  Daughter reports pt was not eating or drinking and having diarrhea, also reporting mentation changes at facility.  Pt currently requiring +2 total assist for bed mobility today.  Recommend use of lift equipment for OOB during hospitalization.           If plan is discharge home, recommend the following: Two people to help with walking and/or transfers;Assistance with cooking/housework;Assist for transportation;Help with stairs or ramp for entrance;A lot of help with bathing/dressing/bathroom   Can travel by private vehicle   No    Equipment Recommendations None recommended by PT  Recommendations for Other Services       Functional Status Assessment Patient has had a recent decline in their functional status and demonstrates the ability to make significant improvements in function in a reasonable and predictable amount of time.     Precautions / Restrictions Precautions Precautions: Fall;Shoulder Type of Shoulder Precautions: no  ROM shoulder, OK for hand wrist and elbow, per phone call on 7/24 with Dr.Landau patient is ok to use RUE on rollator for transfers and functional mobility. Shoulder Interventions: Shoulder sling/immobilizer;At all times;Off for dressing/bathing/exercises Precaution Comments: no, flexion/ABD right shoulder, may place hand on rollator and bear weight to RUE to transfer (all per previous admission about 2 weeks ago) Restrictions Weight Bearing Restrictions: Yes RUE Weight Bearing: Non weight bearing Other Position/Activity Restrictions: OK to WB on RW for transfers      Mobility  Bed Mobility Overal bed mobility: Needs Assistance Bed Mobility: Supine to Sit     Supine to sit: Total assist, +2 for physical assistance Sit to supine: Total assist, +2 for physical assistance   General bed mobility comments: Increased time with assist to manage LEs, control trunk and complete rotation with bed pad; pt encouraged to initiate however would stop and state she needed assist    Transfers                        Ambulation/Gait                  Stairs            Wheelchair Mobility     Tilt Bed    Modified Rankin (Stroke Patients Only)       Balance Overall balance assessment: Needs assistance, History of Falls Sitting-balance support: Feet supported, Single extremity supported Sitting balance-Leahy Scale: Poor Sitting balance - Comments: able to maintain midline briefly with L UE support however quickly required assist/support with fatigue  or challenges                                     Pertinent Vitals/Pain Pain Assessment Pain Assessment: 0-10 Pain Score: 5  Pain Location: abdomen Pain Descriptors / Indicators: Grimacing, Guarding Pain Intervention(s): Repositioned, Monitored during session    Home Living Family/patient expects to be discharged to:: Skilled nursing facility                   Additional Comments: sister  lives upstairs and cannot assist. has caregivers daily 8:30-900PM, alone at night. --from previous admission    Prior Function Prior Level of Function : Needs assist             Mobility Comments: pt admitted from SNF and facility was using hoyer lift for OOB, pt had not been standing since hospital admission ( and stedy equipment required for that admission)       Extremity/Trunk Assessment   Upper Extremity Assessment Upper Extremity Assessment: RUE deficits/detail RUE Deficits / Details: R UE in sling for session - defer to OT    Lower Extremity Assessment Lower Extremity Assessment: Generalized weakness LLE Deficits / Details: 15* knee flexion contracture, L LE appears weaker then R LE       Communication   Communication Communication: Difficulty following commands/understanding (required repeated cues)  Cognition Arousal: Alert Behavior During Therapy: Flat affect Overall Cognitive Status: Impaired/Different from baseline Area of Impairment: Memory, Safety/judgement                     Memory: Decreased short-term memory   Safety/Judgement: Decreased awareness of deficits, Decreased awareness of safety              General Comments      Exercises General Exercises - Lower Extremity Ankle Circles/Pumps: AROM, 10 reps, Both, Seated Short Arc Quad: AROM, Seated, Both, 10 reps, Limitations Short Arc Quad Limitations: limited ROM due to weakness Hip ABduction/ADduction: AROM, Both, 10 reps, Seated, Limitations Hip Abduction/Adduction Limitations: limited ROM due to weakness Other Exercises Other Exercises: Encouraged holding midline upright posture with L UE support at EOB, holding until fatigued, performed at least 5 minutes at EOB   Assessment/Plan    PT Assessment Patient needs continued PT services  PT Problem List Decreased strength;Decreased activity tolerance;Decreased mobility;Decreased safety awareness;Decreased range of motion;Decreased  balance;Decreased knowledge of precautions       PT Treatment Interventions DME instruction;Therapeutic activities;Cognitive remediation;Gait training;Functional mobility training;Therapeutic exercise;Patient/family education;Balance training;Wheelchair mobility training    PT Goals (Current goals can be found in the Care Plan section)  Acute Rehab PT Goals PT Goal Formulation: With patient/family Time For Goal Achievement: 07/19/23 Potential to Achieve Goals: Fair    Frequency Min 1X/week     Co-evaluation               AM-PAC PT "6 Clicks" Mobility  Outcome Measure Help needed turning from your back to your side while in a flat bed without using bedrails?: Total Help needed moving from lying on your back to sitting on the side of a flat bed without using bedrails?: Total Help needed moving to and from a bed to a chair (including a wheelchair)?: Total Help needed standing up from a chair using your arms (e.g., wheelchair or bedside chair)?: Total Help needed to walk in hospital room?: Total Help needed climbing 3-5 steps with a railing? : Total  6 Click Score: 6    End of Session Equipment Utilized During Treatment: Gait belt Activity Tolerance: Patient tolerated treatment well Patient left: in bed;with call bell/phone within reach;with bed alarm set;with family/visitor present Nurse Communication: Other (comment) (need for pericare/hygiene) PT Visit Diagnosis: Muscle weakness (generalized) (M62.81);Difficulty in walking, not elsewhere classified (R26.2)    Time: 8469-6295 PT Time Calculation (min) (ACUTE ONLY): 24 min   Charges:   PT Evaluation $PT Eval Moderate Complexity: 1 Mod PT Treatments $Therapeutic Exercise: 8-22 mins PT General Charges $$ ACUTE PT VISIT: 1 Visit        Thomasene Mohair PT, DPT Physical Therapist Acute Rehabilitation Services Office: [phone]   Tricia Harrison 07/05/2023, 1:37 PM

## 2023-07-05 NOTE — Consult Note (Signed)
Reason for Consult: Bright red blood per rectum in  patient on blood thinner Referring Physician: Hospital team  Tricia Harrison is an 77 y.o. female.  HPI: Patient seen and examined in her hospital computer chart and our office computer chart and she is familiar to me from multiple endoscopic and colonoscopies over the years unfortunately she is in much worse shape than she was the last time I saw her in the office last year and she had a colonoscopy in 2018 2019 and 2022 and her case was briefly discussed with her daughter and she has been on blood thinner at her rehab facility and they saw a little bit of bright red blood for a few days and she was sent here and has had both urinary and bowel incontinence but no bleeding and her case discussed with nurse and her stool was checked by me and she has no other specific complaints and nurse is concerned about a UTI and will alert the hospital team for needs for urinalysis and culture  Past Medical History:  Diagnosis Date   Anemia    as a child   Anginal pain (HCC)    Asthma    as a child   Bladder incontinence    Chronic back pain    spinal stenosis and buldging disc;scoliosis   Chronic constipation    occasionally takes something OTC   Chronic kidney disease    Diabetes mellitus without complication (HCC)    per pt "Pre" for 20+yrs   Diverticulosis    DVT (deep venous thrombosis) (HCC)    Dyspnea    occasional   Gallstones    has known about this for 3-34yrs   GERD (gastroesophageal reflux disease)    takes Omeprazole daily   Glaucoma    H/O hiatal hernia    Heart murmur    Hemorrhoids    History of blood transfusion    no abnormal reaction noted   History of bronchitis    states its been a long time ago   History of gout    HLD (hyperlipidemia)    takes Fenofibrate daily   HTN (hypertension)    takes Diltiazem and Ramipril daily   Hypothyroidism    takes Synthroid daily   Left knee DJD    Macular degeneration     Nocturia    Palpitations    started in 2013 and only occasionally;last time noticed about 2-3wks ago and after drinking caffeine   Peripheral vascular disease (HCC)    PONV (postoperative nausea and vomiting)    problems swallowing after anethesia- 2/2023and slurred speech also   Pulmonary embolism with acute cor pulmonale (HCC) 07/2018   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   Sleep apnea    does not uses cpap    Past Surgical History:  Procedure Laterality Date   3-day EVENT MONITOR  01/2019   Mostly normal monitor.  Predominantly sinus rhythm (rate range 48-105 bpm, average 67 bpm) 9 short atrial runs (fastest = 13 beats rate 135 bpm, longest ~17 seconds-101 bpm) -> not noted on symptom log.  No sustained arrhythmias (tachycardia or bradycardia), or pauses..  Symptoms are noted with PVCs.   bilateral cataract surgery      CHOLECYSTECTOMY N/A 01/10/2022   Procedure: LAPAROSCOPIC CHOLECYSTECTOMY WITH INTRAOPERATIVE CHOLANGIOGRAM;  Surgeon: Karie Soda, MD;  Location: [facility];  Service: General;  Laterality: N/A;   COLONOSCOPY  CT ANGIOGRAM OF CHEST - PE PROTOCOL  07/2018   Bilateral nonocclusive pulmonary emboli evidence of right heart strain consistent with "submassive "/intermediate PE.  -->  Code PE called.   DILATION AND CURETTAGE OF UTERUS     multiple about 6-7 times   ESOPHAGOGASTRODUODENOSCOPY     NM VQ LUNG SCAN ([facility] HX)  11/23/2018   Low probability for PE   REVERSE SHOULDER ARTHROPLASTY Right 06/17/2023   Procedure: RIGHT REVERSE SHOULDER ARTHROPLASTY;  Surgeon: Teryl Lucy, MD;  Location: [facility];  Service: Orthopedics;  Laterality: Right;   RIGHT/LEFT HEART CATH AND CORONARY ANGIOGRAPHY N/A 08/19/2018   Procedure: RIGHT/LEFT HEART CATH AND CORONARY ANGIOGRAPHY;  Surgeon: Corky Crafts, MD;  Location: [facility] IN[facility]ASIVE CV LAB;  Service: Cardiovascular:: Nonobstructive CAD.  Mild pulmonary pretension.   Mean PA pressure 26 mmHg with PA P 48/20 mmHg.  Normal LVEDP.   THYROID SURGERY Right 2010   TOTAL HIP ARTHROPLASTY Left 2005   TOTAL KNEE ARTHROPLASTY Left 02/21/2014   Procedure: TOTAL KNEE ARTHROPLASTY;  Surgeon: Nilda Simmer, MD;  Location: [facility];  Service: Orthopedics;  Laterality: Left;   TRANSTHORACIC ECHOCARDIOGRAM  07/2018    (In setting of acute PE) mild LVH.  EF 55 to 60%.  GR 1 DD.  Aortic sclerosis but no stenosis.  Mild regurgitation.  Mild RA dilation.  Moderate LA dilation.  Moderate tricuspid regurgitation with severe primary hypertension estimated PA pressure 71 mmHg.  RV poorly visualized   TRANSTHORACIC ECHOCARDIOGRAM  10/2018 - 11/2019   a) EF 60-65%.  GR 1 DD.  Focal basal LVH. Mod AI/ no AS. Mild LA dilation.  Normal RV size and fxn.  Mod PI w/ mild TR.  Peak PAP ~ 38 mmHg; b) Normal EF 60 to 65%.  Moderate LVH-GR 1 DD.  R WMA.  Mild AS w/ trivial AI.  Grossly normal PA, RV and RA size.  Normal RV function.  Normal PA pressures.   TRANSTHORACIC ECHOCARDIOGRAM  01/2021    EF 55%.  Normal LV function.  Mild LVH.  GR 1 DD.  Normal PA pressures.  Severe LA dilation.  (Consistent with more significant diastolic dysfunction).  Mild-possibly mild to moderate aortic insufficiency.  Borderline elevated RAP.  Trivial pericardial effusion.  (Stable)   VAGINAL HYSTERECTOMY  1979    Family History  Problem Relation Age of Onset   Uterine cancer Mother    Heart disease Mother    Hypertension Mother    Diabetes Mother    Cirrhosis Father    Pneumonia Father    Lymphoma Sister        ,non hodgkin    Diabetes Sister    Breast cancer Neg Hx     Social History:  reports that she quit smoking about 29 years ago. Her smoking use included cigarettes. She started smoking about 58 years ago. She has a 14.6 pack-year smoking history. She has never used smokeless tobacco. She reports current alcohol use. She reports that she does not use drugs.  Allergies:  Allergies  Allergen  Reactions   Atorvastatin Other (See Comments)    Muscle pain   Codeine Nausea And Vomiting   Morphine And Codeine     B/p drops     Medications: I have reviewed the patient's current medications.  Results for orders placed or performed during the hospital encounter of 07/04/23 (from the past 48 hour(s))  CBC with Differential/Platelet     Status: Abnormal   Collection Time: 07/04/23  3:11 PM  Result Value Ref Range   WBC 9.6 4.0 - 10.5 K/uL   RBC 2.92 (L) 3.87 - 5.11 MIL/uL   Hemoglobin 8.9 (L) 12.0 - 15.0 g/dL   HCT 63.0 (L) 16.0 - 10.9 %   MCV 95.2 80.0 - 100.0 fL   MCH 30.5 26.0 - 34.0 pg   MCHC 32.0 30.0 - 36.0 g/dL   RDW 32.3 55.7 - 32.2 %   Platelets 539 (H) 150 - 400 K/uL   nRBC 0.0 0.0 - 0.2 %   Neutrophils Relative % 62 %   Neutro Abs 6.0 1.7 - 7.7 K/uL   Lymphocytes Relative 12 %   Lymphs Abs 1.1 0.7 - 4.0 K/uL   Monocytes Relative 24 %   Monocytes Absolute 2.3 (H) 0.1 - 1.0 K/uL   Eosinophils Relative 1 %   Eosinophils Absolute 0.1 0.0 - 0.5 K/uL   Basophils Relative 0 %   Basophils Absolute 0.0 0.0 - 0.1 K/uL   Immature Granulocytes 1 %   Abs Immature Granulocytes 0.07 0.00 - 0.07 K/uL    Comment: Performed at [facility], [address], [place], [place] [address]  Comprehensive metabolic panel     Status: Abnormal   Collection Time: 07/04/23  3:11 PM  Result Value Ref Range   Sodium 133 (L) 135 - 145 mmol/L   Potassium 4.3 3.5 - 5.1 mmol/L   Chloride 106 98 - 111 mmol/L   CO2 18 (L) 22 - 32 mmol/L   Glucose, Bld 135 (H) 70 - 99 mg/dL    Comment: Glucose reference range applies only to samples taken after fasting for at least 8 hours.   BUN 77 (H) 8 - 23 mg/dL   Creatinine, Ser 7.06 (H) 0.44 - 1.00 mg/dL   Calcium 8.1 (L) 8.9 - 10.3 mg/dL   Total Protein 5.9 (L) 6.5 - 8.1 g/dL   Albumin 2.3 (L) 3.5 - 5.0 g/dL   AST 35 15 - 41 U/L   ALT 15 0 - 44 U/L   Alkaline Phosphatase 47 38 - 126 U/L   Total Bilirubin 0.5 0.3 - 1.2 mg/dL    GFR, Estimated 18 (L) >60 mL/min    Comment: (NOTE) Calculated using the CKD-EPI Creatinine Equation (2021)    Anion gap 9 5 - 15    Comment: Performed at [facility], [address], [place], [place] [address]  Hemoglobin and hematocrit, blood     Status: Abnormal   Collection Time: 07/04/23  9:16 PM  Result Value Ref Range   Hemoglobin 10.1 (L) 12.0 - 15.0 g/dL   HCT 83.1 (L) 51.7 - 61.6 %    Comment: Performed at [facility], [address] [place], [place] [address]  Type and screen [facility]     Status: None   Collection Time: 07/04/23  9:16 PM  Result Value Ref Range   ABO/RH(D) A POS    Antibody Screen NEG    Sample Expiration      07/07/2023,2359 Performed at [facility], [address] [place], [place] [address]   CK     Status: None   Collection Time: 07/04/23  9:16 PM  Result Value Ref Range   Total CK 110 38 - 234 U/L    Comment: Performed at [facility], [address], [place], [place] [address]  Glucose, capillary     Status: None   Collection Time: 07/04/23 10:48 PM  Result Value  Ref Range   Glucose-Capillary 75 70 - 99 mg/dL    Comment: Glucose reference range applies only to samples taken after fasting for at least 8 hours.  Glucose, capillary     Status: None   Collection Time: 07/05/23  7:34 AM  Result Value Ref Range   Glucose-Capillary 76 70 - 99 mg/dL    Comment: Glucose reference range applies only to samples taken after fasting for at least 8 hours.  CBC     Status: Abnormal   Collection Time: 07/05/23  8:42 AM  Result Value Ref Range   WBC 7.5 4.0 - 10.5 K/uL   RBC 2.73 (L) 3.87 - 5.11 MIL/uL   Hemoglobin 8.5 (L) 12.0 - 15.0 g/dL   HCT 16.1 (L) 09.6 - 04.5 %   MCV 94.9 80.0 - 100.0 fL   MCH 31.1 26.0 - 34.0 pg   MCHC 32.8 30.0 - 36.0 g/dL   RDW 40.9 81.1 - 91.4 %   Platelets 446 (H) 150 - 400 K/uL   nRBC 0.0 0.0 - 0.2 %    Comment: Performed  at [facility], [address], [place], [place] [address]  Comprehensive metabolic panel     Status: Abnormal   Collection Time: 07/05/23  8:42 AM  Result Value Ref Range   Sodium 135 135 - 145 mmol/L   Potassium 3.7 3.5 - 5.1 mmol/L   Chloride 107 98 - 111 mmol/L   CO2 16 (L) 22 - 32 mmol/L   Glucose, Bld 87 70 - 99 mg/dL    Comment: Glucose reference range applies only to samples taken after fasting for at least 8 hours.   BUN 68 (H) 8 - 23 mg/dL   Creatinine, Ser 6.21 (H) 0.44 - 1.00 mg/dL   Calcium 8.2 (L) 8.9 - 10.3 mg/dL   Total Protein 5.5 (L) 6.5 - 8.1 g/dL   Albumin 2.4 (L) 3.5 - 5.0 g/dL   AST 27 15 - 41 U/L   ALT 15 0 - 44 U/L   Alkaline Phosphatase 40 38 - 126 U/L   Total Bilirubin 0.6 0.3 - 1.2 mg/dL   GFR, Estimated 23 (L) >60 mL/min    Comment: (NOTE) Calculated using the CKD-EPI Creatinine Equation (2021)    Anion gap 12 5 - 15    Comment: Performed at [facility], [address] [place], [place] [address]  Glucose, capillary     Status: None   Collection Time: 07/05/23  1:01 PM  Result Value Ref Range   Glucose-Capillary 89 70 - 99 mg/dL    Comment: Glucose reference range applies only to samples taken after fasting for at least 8 hours.    VAS [place] LOWER EXTREMITY VENOUS (DVT) (7a-7p)  Result Date: 07/04/2023  Lower Venous DVT Study Patient Name:  Tricia Harrison  Date of Exam:   07/04/2023 Medical Rec #: [id number]             Accession #:    [id number] Date of Birth: March 10, 1946             Patient Gender: F Patient Age:   3 years Exam Location:  [facility] Procedure:      VAS [place] LOWER EXTREMITY VENOUS (DVT) Referring Phys: Vanetta Mulders --------------------------------------------------------------------------------  Indications: Hx of DVT, shoulder surgery 2 weeks ago, increased swelling in thigh and lateral calf.  Anticoagulation: Eliquis. Limitations: Body habitus and poor ultrasound/tissue interface.  Comparison Study: Previous 08/21/18 age indeterminate DVT Performing Technologist:  McKayla Maag RVT, [place]  Examination Guidelines: A complete evaluation includes B-mode imaging, spectral Doppler, color Doppler, and power Doppler as needed of all accessible portions of each vessel. Bilateral testing is considered an integral part of a complete examination. Limited examinations for reoccurring indications may be performed as noted. The reflux portion of the exam is performed with the patient in reverse Trendelenburg.  +-----+---------------+---------+-----------+----------+--------------+ RIGHTCompressibilityPhasicitySpontaneityPropertiesThrombus Aging +-----+---------------+---------+-----------+----------+--------------+ CFV  Full           Yes      Yes                                 +-----+---------------+---------+-----------+----------+--------------+ SFJ  Full                                                        +-----+---------------+---------+-----------+----------+--------------+   +--------+---------------+---------+-----------+----------+--------------------+ LEFT    CompressibilityPhasicitySpontaneityPropertiesThrombus Aging       +--------+---------------+---------+-----------+----------+--------------------+ CFV     Full           Yes      Yes                                       +--------+---------------+---------+-----------+----------+--------------------+ SFJ     Full                                                              +--------+---------------+---------+-----------+----------+--------------------+ FV Prox Full                                                              +--------+---------------+---------+-----------+----------+--------------------+ FV Mid  Full                                                              +--------+---------------+---------+-----------+----------+--------------------+ FV                     Yes       Yes                  patent by color      Distal                                               doppler              +--------+---------------+---------+-----------+----------+--------------------+ PFV     Full                                                              +--------+---------------+---------+-----------+----------+--------------------+  POP     Full           Yes      Yes                                       +--------+---------------+---------+-----------+----------+--------------------+ PTV     Full                                                              +--------+---------------+---------+-----------+----------+--------------------+ PERO    Full                                                              +--------+---------------+---------+-----------+----------+--------------------+     Summary: RIGHT: - No evidence of common femoral vein obstruction.  LEFT: - There is no evidence of deep vein thrombosis in the lower extremity. However, portions of this examination were limited- see technologist comments above.  - No cystic structure found in the popliteal fossa.  *See table(s) above for measurements and observations. Electronically signed by Gerarda Fraction on 07/04/2023 at 3:40:36 PM.    Final    DG Chest Port 1 View  Result Date: 07/04/2023 CLINICAL DATA:  77 year old female with history of DVT EXAM: PORTABLE CHEST 1 VIEW COMPARISON:  11/23/2018 FINDINGS: Left rotation of the patient. Cardiomediastinal silhouette borderline enlarged in size and contour. No evidence of central vascular congestion. No interlobular septal thickening. Low lung volumes. No pneumothorax or pleural effusion. Coarsened interstitial markings, with no confluent airspace disease. Interval surgical changes of the right shoulder. Degenerative changes of the left GH joint. Scoliotic curvature. IMPRESSION: Low lung volumes without evidence of acute cardiopulmonary disease.  Electronically Signed   By: Gilmer Mor D.O.   On: 07/04/2023 15:27    Review of Systems negative except above Blood pressure (!) 140/74, pulse 68, temperature 98.7 F (37.1 C), temperature source Oral, resp. rate 16, SpO2 99%. Physical Exam patient not examined today currently being cleaned up by the nurse no acute distress stool brown BUN and creatinine slight decrease from admission but still above her baseline hemoglobin at baseline unfortunately expected to drop with hydration  Assessment/Plan: Multiple medical problems in a patient on blood thinner to include chronic anemia and bright red blood per rectum Plan: When she is medically stable would probably start GI workup with a oral only CT scan just to rule out anything significant and continue observation to see if any further signs of bleeding and I will check on tomorrow and will order urinalysis and culture believe up to the hospital doctor to see if In-N-Out cath is needed or even keeping a catheter in place  [facility] E 07/05/2023, 2:23 PM

## 2023-07-05 NOTE — Progress Notes (Signed)
PROGRESS NOTE    Tricia Harrison  ZOX:[id number] DOB: 03/12/1946 DOA: 07/04/2023 PCP: Darrow Bussing, MD    Brief Narrative:  77 year old with history of chronic anemia, chronic back pain, chronic constipation, CKD stage IIIb, hypertension and hyperlipidemia, hypothyroidism, dementia, history of pulmonary embolism currently on apixaban who recently underwent right shoulder replacement, significant postoperative delirium and confusion and was ultimately sent to skilled nursing rehab.  Patient was sent back from the facility with decreased oral intake, nausea and emesis as well as diarrhea along with stool mixed with blood after using laxatives.  She was also more confused than usual.  In the emergency room hemoglobin 8.9 at about baseline.  Creatinine 2.7 with baseline creatinine of 1.5-1.6.  Chest x-ray low lung volumes without evidence of acute cardiovascular disease.  99% on room air.  Found significantly dehydrated, complaint of diarrhea.  Admitted due to significant findings.   Assessment & Plan:   Acute kidney injury with chronic kidney disease stage IIIb: Baseline creatinine 1.5-1.6.  Currently with poor appetite, poor oral intake, placement to nursing home.  Also on valsartan. Continue gentle IV fluids.  Renal functions gradually improving.  Monitor urine output.  No evidence of urinary obstruction.  Avoid all valsartan.  Encourage oral intake.  Chronic constipation now with diarrhea Hematochezia, likely outlet bleeding Acute on chronic blood loss anemia. Known baseline hemoglobin of 8-10.  Presented with hemoglobin of 8.9 with.  After rehydration her hemoglobin is 8.5. Continue to monitor levels. Diarrhea was likely related to use of laxatives, however possible to have diverticular disease. Check C. difficile, GI pathogen panel. Holding Eliquis.  GI following.  Type 2 diabetes, diet controlled.  On carb modified diet. Essential hypertension, dehydrated.  Holding  antihypertensives. Hypothyroidism, Synthroid.  Continue. GERD, on Prilosec.  History of pulmonary embolism on Eliquis: Holding Eliquis.  If no further bleeding, will challenge her again with Eliquis.  Recent right shoulder surgery, difficult recovery.  Patient is having hard time recovering from shoulder surgery, postop delirium and debility.  Start working with PT OT.     DVT prophylaxis: SCDs Start: 07/04/23 1731   Code Status: Full code Family Communication: Daughter-in-law at the bedside Disposition Plan: Status is: Observation The patient will require care spanning > 2 midnights and should be moved to inpatient because: IV fluids, monitoring for bleeding     Consultants:  GI  Procedures:  None  Antimicrobials:  None   Subjective: Patient seen in the morning rounds.  Poor historian.  Patient tells me that anything she eats goes out of the rectum and she has incontinence.  Patient tells me she feels foggy at times.  Does not have good appetite.  Right shoulder hurts and she has not been able to use it properly.  Met with patient's daughter-in-law at the bedside who is stated that her mental status has slightly improved than before but she has become very weak and weak appetite.  Objective: Vitals:   07/04/23 1855 07/04/23 2214 07/05/23 0315 07/05/23 0609  BP: (!) 143/50 (!) 140/89 137/82 (!) 140/74  Pulse: 67 97 66 68  Resp: 18 16 18 16   Temp: 98.7 F (37.1 C) 99.3 F (37.4 C) 99.1 F (37.3 C) 98.7 F (37.1 C)  TempSrc:  Oral Oral Oral  SpO2: 97% 100% 100% 99%    Intake/Output Summary (Last 24 hours) at 07/05/2023 1424 Last data filed at 07/05/2023 0840 Gross per 24 hour  Intake 2175.51 ml  Output --  Net 2175.51 ml   There  were no vitals filed for this visit.  Examination:  General exam: Anxious.  Chronically sick looking.  Not in any distress. Alert awake to herself and situation.  Not oriented to time and place.  Flat affect.  Anxious mood.  Moves all  extremities. Respiratory system: No added sounds. Cardiovascular system: S1 & S2 heard, RRR.  No peripheral edema. Gastrointestinal system: Soft.  Nontender.  Bowel sounds present.     Data Reviewed: I have personally reviewed following labs and imaging studies  CBC: Recent Labs  Lab 07/04/23 1511 07/04/23 2116 07/05/23 0842  WBC 9.6  --  7.5  NEUTROABS 6.0  --   --   HGB 8.9* 10.1* 8.5*  HCT 27.8* 31.2* 25.9*  MCV 95.2  --  94.9  PLT 539*  --  446*   Basic Metabolic Panel: Recent Labs  Lab 07/04/23 1511 07/05/23 0842  NA 133* 135  K 4.3 3.7  CL 106 107  CO2 18* 16*  GLUCOSE 135* 87  BUN 77* 68*  CREATININE 2.70* 2.18*  CALCIUM 8.1* 8.2*   GFR: CrCl cannot be calculated (Unknown ideal weight.). Liver Function Tests: Recent Labs  Lab 07/04/23 1511 07/05/23 0842  AST 35 27  ALT 15 15  ALKPHOS 47 40  BILITOT 0.5 0.6  PROT 5.9* 5.5*  ALBUMIN 2.3* 2.4*   No results for input(s): "LIPASE", "AMYLASE" in the last 168 hours. No results for input(s): "AMMONIA" in the last 168 hours. Coagulation Profile: No results for input(s): "INR", "PROTIME" in the last 168 hours. Cardiac Enzymes: Recent Labs  Lab 07/04/23 2116  CKTOTAL 110   BNP (last 3 results) No results for input(s): "PROBNP" in the last 8760 hours. HbA1C: No results for input(s): "HGBA1C" in the last 72 hours. CBG: Recent Labs  Lab 07/04/23 2248 07/05/23 0734 07/05/23 1301  GLUCAP 75 76 89   Lipid Profile: No results for input(s): "CHOL", "HDL", "LDLCALC", "TRIG", "CHOLHDL", "LDLDIRECT" in the last 72 hours. Thyroid Function Tests: No results for input(s): "TSH", "T4TOTAL", "FREET4", "T3FREE", "THYROIDAB" in the last 72 hours. Anemia Panel: No results for input(s): "VITAMINB12", "FOLATE", "FERRITIN", "TIBC", "IRON", "RETICCTPCT" in the last 72 hours. Sepsis Labs: No results for input(s): "PROCALCITON", "LATICACIDVEN" in the last 168 hours.  No results found for this or any previous visit  (from the past 240 hour(s)).       Radiology Studies: VAS [place] LOWER EXTREMITY VENOUS (DVT) (7a-7p)  Result Date: 07/04/2023  Lower Venous DVT Study Patient Name:  Tricia Harrison  Date of Exam:   07/04/2023 Medical Rec #: [id number]             Accession #:    [id number] Date of Birth: 07/20/1946             Patient Gender: F Patient Age:   53 years Exam Location:  [facility] Procedure:      VAS [place] LOWER EXTREMITY VENOUS (DVT) Referring Phys: Vanetta Mulders --------------------------------------------------------------------------------  Indications: Hx of DVT, shoulder surgery 2 weeks ago, increased swelling in thigh and lateral calf.  Anticoagulation: Eliquis. Limitations: Body habitus and poor ultrasound/tissue interface. Comparison Study: Previous 08/21/18 age indeterminate DVT Performing Technologist: McKayla Maag RVT, [place]  Examination Guidelines: A complete evaluation includes B-mode imaging, spectral Doppler, color Doppler, and power Doppler as needed of all accessible portions of each vessel. Bilateral testing is considered an integral part of a complete examination. Limited examinations for reoccurring indications may be performed as noted. The reflux portion of the exam is  performed with the patient in reverse Trendelenburg.  +-----+---------------+---------+-----------+----------+--------------+ RIGHTCompressibilityPhasicitySpontaneityPropertiesThrombus Aging +-----+---------------+---------+-----------+----------+--------------+ CFV  Full           Yes      Yes                                 +-----+---------------+---------+-----------+----------+--------------+ SFJ  Full                                                        +-----+---------------+---------+-----------+----------+--------------+   +--------+---------------+---------+-----------+----------+--------------------+ LEFT    CompressibilityPhasicitySpontaneityPropertiesThrombus Aging        +--------+---------------+---------+-----------+----------+--------------------+ CFV     Full           Yes      Yes                                       +--------+---------------+---------+-----------+----------+--------------------+ SFJ     Full                                                              +--------+---------------+---------+-----------+----------+--------------------+ FV Prox Full                                                              +--------+---------------+---------+-----------+----------+--------------------+ FV Mid  Full                                                              +--------+---------------+---------+-----------+----------+--------------------+ FV                     Yes      Yes                  patent by color      Distal                                               doppler              +--------+---------------+---------+-----------+----------+--------------------+ PFV     Full                                                              +--------+---------------+---------+-----------+----------+--------------------+ POP     Full  Yes      Yes                                       +--------+---------------+---------+-----------+----------+--------------------+ PTV     Full                                                              +--------+---------------+---------+-----------+----------+--------------------+ PERO    Full                                                              +--------+---------------+---------+-----------+----------+--------------------+     Summary: RIGHT: - No evidence of common femoral vein obstruction.  LEFT: - There is no evidence of deep vein thrombosis in the lower extremity. However, portions of this examination were limited- see technologist comments above.  - No cystic structure found in the popliteal fossa.  *See table(s) above for measurements and  observations. Electronically signed by Gerarda Fraction on 07/04/2023 at 3:40:36 PM.    Final    DG Chest Port 1 View  Result Date: 07/04/2023 CLINICAL DATA:  77 year old female with history of DVT EXAM: PORTABLE CHEST 1 VIEW COMPARISON:  11/23/2018 FINDINGS: Left rotation of the patient. Cardiomediastinal silhouette borderline enlarged in size and contour. No evidence of central vascular congestion. No interlobular septal thickening. Low lung volumes. No pneumothorax or pleural effusion. Coarsened interstitial markings, with no confluent airspace disease. Interval surgical changes of the right shoulder. Degenerative changes of the left GH joint. Scoliotic curvature. IMPRESSION: Low lung volumes without evidence of acute cardiopulmonary disease. Electronically Signed   By: Gilmer Mor D.O.   On: 07/04/2023 15:27        Scheduled Meds:  latanoprost  1 drop Both Eyes QHS   levothyroxine  50 mcg Oral Q0600   pantoprazole  40 mg Oral Daily   Continuous Infusions:  sodium chloride 100 mL/hr at 07/05/23 0541     LOS: 0 days    Time spent: 35 minutes    Dorcas Carrow, MD [facility]

## 2023-07-06 DIAGNOSIS — N179 Acute kidney failure, unspecified: Secondary | ICD-10-CM | POA: Diagnosis not present

## 2023-07-06 LAB — CBC WITH DIFFERENTIAL/PLATELET
Abs Immature Granulocytes: 0.08 10*3/uL — ABNORMAL HIGH (ref 0.00–0.07)
Basophils Absolute: 0.1 10*3/uL (ref 0.0–0.1)
Basophils Relative: 1 %
Eosinophils Absolute: 0.1 10*3/uL (ref 0.0–0.5)
Eosinophils Relative: 1 %
HCT: 26.2 % — ABNORMAL LOW (ref 36.0–46.0)
Hemoglobin: 8.4 g/dL — ABNORMAL LOW (ref 12.0–15.0)
Immature Granulocytes: 1 %
Lymphocytes Relative: 10 %
Lymphs Abs: 0.9 10*3/uL (ref 0.7–4.0)
MCH: 30.4 pg (ref 26.0–34.0)
MCHC: 32.1 g/dL (ref 30.0–36.0)
MCV: 94.9 fL (ref 80.0–100.0)
Monocytes Absolute: 1.8 10*3/uL — ABNORMAL HIGH (ref 0.1–1.0)
Monocytes Relative: 19 %
Neutro Abs: 6.4 10*3/uL (ref 1.7–7.7)
Neutrophils Relative %: 68 %
Platelets: 482 10*3/uL — ABNORMAL HIGH (ref 150–400)
RBC: 2.76 MIL/uL — ABNORMAL LOW (ref 3.87–5.11)
RDW: 14.5 % (ref 11.5–15.5)
WBC: 9.3 10*3/uL (ref 4.0–10.5)
nRBC: 0 % (ref 0.0–0.2)

## 2023-07-06 LAB — GASTROINTESTINAL PANEL BY PCR, STOOL (REPLACES STOOL CULTURE)

## 2023-07-06 LAB — GLUCOSE, CAPILLARY
Glucose-Capillary: 69 mg/dL — ABNORMAL LOW (ref 70–99)
Glucose-Capillary: 82 mg/dL (ref 70–99)
Glucose-Capillary: 106 mg/dL — ABNORMAL HIGH (ref 70–99)
Glucose-Capillary: 129 mg/dL — ABNORMAL HIGH (ref 70–99)
Glucose-Capillary: 130 mg/dL — ABNORMAL HIGH (ref 70–99)

## 2023-07-06 LAB — BASIC METABOLIC PANEL WITH GFR
Anion gap: 10 (ref 5–15)
BUN: 61 mg/dL — ABNORMAL HIGH (ref 8–23)
CO2: 16 mmol/L — ABNORMAL LOW (ref 22–32)
Calcium: 7.9 mg/dL — ABNORMAL LOW (ref 8.9–10.3)
Chloride: 112 mmol/L — ABNORMAL HIGH (ref 98–111)
Creatinine, Ser: 1.92 mg/dL — ABNORMAL HIGH (ref 0.44–1.00)
GFR, Estimated: 27 mL/min — ABNORMAL LOW (ref >60)
Glucose, Bld: 71 mg/dL (ref 70–99)
Potassium: 3.6 mmol/L (ref 3.5–5.1)
Sodium: 138 mmol/L (ref 135–145)

## 2023-07-06 MED ORDER — APIXABAN 5 MG PO TABS
5.0000 mg | ORAL_TABLET | Freq: Two times a day (BID) | ORAL | Status: DC
  Administered 2023-07-06 – 2023-07-08 (×4): 5 mg via ORAL
  Filled 2023-07-06 (×4): qty 1

## 2023-07-06 MED ORDER — SODIUM CHLORIDE 0.9 % IV SOLN
1.0000 g | INTRAVENOUS | Status: DC
  Administered 2023-07-06 – 2023-07-10 (×5): 1 g via INTRAVENOUS
  Filled 2023-07-06 (×5): qty 10

## 2023-07-06 NOTE — Progress Notes (Signed)
PROGRESS NOTE    Tricia Harrison  ZOX:[id number] DOB: March 04, 1946 DOA: 07/04/2023 PCP: Darrow Bussing, MD    Brief Narrative:  77 year old with history of chronic anemia, chronic back pain, chronic constipation, CKD stage IIIb, hypertension and hyperlipidemia, hypothyroidism, dementia, history of pulmonary embolism currently on apixaban who recently underwent right shoulder replacement, significant postoperative delirium and confusion and was ultimately sent to skilled nursing rehab.  Patient was sent back from the facility with decreased oral intake, nausea and emesis as well as diarrhea along with stool mixed with blood after using laxatives.  She was also more confused than usual.  In the emergency room hemoglobin 8.9 at about baseline.  Creatinine 2.7 with baseline creatinine of 1.5-1.6.  Chest x-ray low lung volumes without evidence of acute cardiovascular disease.  99% on room air.  Found significantly dehydrated, complaint of diarrhea.  Admitted due to significant findings.   Assessment & Plan:   Acute kidney injury with chronic kidney disease stage IIIb: Baseline creatinine 1.5-1.6.  Presented with poor appetite, poor oral intake, placement to nursing home.  Also on valsartan. Continue gentle IV fluids.  Renal functions gradually improving.  Monitor urine output.  No evidence of urinary obstruction.  Avoid all valsartan.  Encourage oral intake.  Recheck levels tomorrow morning. Encourage oral dietary and fluid intake.  Acute UTI suspected present on admission: Urinalysis was abnormal.  Currently without evidence of urinary retention after initial catheterization.  Urine cultures are pending.  Starting on Rocephin.  Encourage oral intake.  Chronic constipation now with diarrhea Hematochezia, likely outlet bleeding Acute on chronic blood loss anemia. Known baseline hemoglobin of 8-10.  Presented with hemoglobin of 8.9 with.  After rehydration her hemoglobin is 8.5. Continue to  monitor levels. Diarrhea was likely related to use of laxatives, however possible to have diverticular disease. C. difficile and GI pathogen panel is negative. Challenge with restarting Eliquis and monitor in the hospital for any bleeding.  Type 2 diabetes, diet controlled.  On carb modified diet. Essential hypertension, dehydrated.  Holding antihypertensives. Hypothyroidism, Synthroid.  Continue. GERD, on Prilosec.  History of pulmonary embolism on Eliquis: No evidence of further bleeding.  Will challenge with Eliquis while in the hospital.    Recent right shoulder surgery, difficult recovery.  Patient is having hard time recovering from shoulder surgery, postop delirium and debility.  Start working with PT OT.  Anticipate back to SNF.     DVT prophylaxis: SCDs Start: 07/04/23 1731 apixaban (ELIQUIS) tablet 5 mg   Code Status: Full code Family Communication: Son and daughter-in-law on the phone. Disposition Plan: Status is: Inpatient.  Remains inpatient due to significant diarrhea, rectal bleeding, IV fluids and IV antibiotics.     Consultants:  GI  Procedures:  None  Antimicrobials:  Rocephin 8/11---   Subjective:  Patient seen and examined.  Poor historian.  Today she denies any diarrhea after eating but she does not have good appetite.  She looks more alert and interactive today.  She is telling me that we will probably send her back to rehab.  Denies right shoulder pain.  Objective: Vitals:   07/05/23 0315 07/05/23 0609 07/05/23 2106 07/06/23 0604  BP: 137/82 (!) 140/74 (!) 154/81 (!) 160/80  Pulse: 66 68 83   Resp: 18 16 18    Temp: 99.1 F (37.3 C) 98.7 F (37.1 C) 98.7 F (37.1 C) 98.8 F (37.1 C)  TempSrc: Oral Oral Oral Oral  SpO2: 100% 99% 100% 99%    Intake/Output Summary (Last 24  hours) at 07/06/2023 1417 Last data filed at 07/06/2023 1000 Gross per 24 hour  Intake 474 ml  Output 1500 ml  Net -1026 ml   There were no vitals filed for this  visit.  Examination:  General exam: In normal mood today.  Chronically sick looking.  Not in any distress. Patient is alert awake and mostly oriented.  She has no neurological deficits.  Moves all extremities equally. Respiratory system: No added sounds. Cardiovascular system: S1 & S2 heard, RRR.  No peripheral edema. Gastrointestinal system: Soft.  Nontender.  Bowel sounds present.     Data Reviewed: I have personally reviewed following labs and imaging studies  CBC: Recent Labs  Lab 07/04/23 1511 07/04/23 2116 07/05/23 0842 07/06/23 0731  WBC 9.6  --  7.5 9.3  NEUTROABS 6.0  --   --  6.4  HGB 8.9* 10.1* 8.5* 8.4*  HCT 27.8* 31.2* 25.9* 26.2*  MCV 95.2  --  94.9 94.9  PLT 539*  --  446* 482*   Basic Metabolic Panel: Recent Labs  Lab 07/04/23 1511 07/05/23 0842 07/06/23 0731  NA 133* 135 138  K 4.3 3.7 3.6  CL 106 107 112*  CO2 18* 16* 16*  GLUCOSE 135* 87 71  BUN 77* 68* 61*  CREATININE 2.70* 2.18* 1.92*  CALCIUM 8.1* 8.2* 7.9*   GFR: CrCl cannot be calculated (Unknown ideal weight.). Liver Function Tests: Recent Labs  Lab 07/04/23 1511 07/05/23 0842  AST 35 27  ALT 15 15  ALKPHOS 47 40  BILITOT 0.5 0.6  PROT 5.9* 5.5*  ALBUMIN 2.3* 2.4*   No results for input(s): "LIPASE", "AMYLASE" in the last 168 hours. No results for input(s): "AMMONIA" in the last 168 hours. Coagulation Profile: No results for input(s): "INR", "PROTIME" in the last 168 hours. Cardiac Enzymes: Recent Labs  Lab 07/04/23 2116  CKTOTAL 110   BNP (last 3 results) No results for input(s): "PROBNP" in the last 8760 hours. HbA1C: No results for input(s): "HGBA1C" in the last 72 hours. CBG: Recent Labs  Lab 07/05/23 1632 07/05/23 2108 07/06/23 0753 07/06/23 0843 07/06/23 1221  GLUCAP 101* 84 69* 82 106*   Lipid Profile: No results for input(s): "CHOL", "HDL", "LDLCALC", "TRIG", "CHOLHDL", "LDLDIRECT" in the last 72 hours. Thyroid Function Tests: No results for  input(s): "TSH", "T4TOTAL", "FREET4", "T3FREE", "THYROIDAB" in the last 72 hours. Anemia Panel: No results for input(s): "VITAMINB12", "FOLATE", "FERRITIN", "TIBC", "IRON", "RETICCTPCT" in the last 72 hours. Sepsis Labs: No results for input(s): "PROCALCITON", "LATICACIDVEN" in the last 168 hours.  Recent Results (from the past 240 hour(s))  C Difficile Quick Screen w PCR reflex     Status: None   Collection Time: 07/05/23 12:30 PM   Specimen: Rectum; Stool  Result Value Ref Range Status   C Diff antigen NEGATIVE NEGATIVE Final   C Diff toxin NEGATIVE NEGATIVE Final   C Diff interpretation No C. difficile detected.  Final    Comment: Performed at [facility], [address], [place], [place] [address]  Culture, [place] Urine     Status: None (Preliminary result)   Collection Time: 07/05/23  3:00 PM   Specimen: Urine, Catheterized  Result Value Ref Range Status   Specimen Description   Final    URINE, CATHETERIZED Performed at [facility], [address] [place], [place] [address]    Special Requests   Final    URINE, RANDOM Performed at [facility], [address] [place],  [place] [address]    Culture   Final    CULTURE REINCUBATED FOR BETTER GROWTH Performed at [facility], [address], [place], [place] [address]    Report Status PENDING  Incomplete         Radiology Studies: VAS [place] LOWER EXTREMITY VENOUS (DVT) (7a-7p)  Result Date: 07/04/2023  Lower Venous DVT Study Patient Name:  Tricia Harrison  Date of Exam:   07/04/2023 Medical Rec #: [id number]             Accession #:    [id number] Date of Birth: 07/24/1946             Patient Gender: F Patient Age:   38 years Exam Location:  [facility] Procedure:      VAS [place] LOWER EXTREMITY VENOUS (DVT) Referring Phys: Vanetta Mulders --------------------------------------------------------------------------------  Indications: Hx of DVT, shoulder surgery 2  weeks ago, increased swelling in thigh and lateral calf.  Anticoagulation: Eliquis. Limitations: Body habitus and poor ultrasound/tissue interface. Comparison Study: Previous 08/21/18 age indeterminate DVT Performing Technologist: McKayla Maag RVT, [place]  Examination Guidelines: A complete evaluation includes B-mode imaging, spectral Doppler, color Doppler, and power Doppler as needed of all accessible portions of each vessel. Bilateral testing is considered an integral part of a complete examination. Limited examinations for reoccurring indications may be performed as noted. The reflux portion of the exam is performed with the patient in reverse Trendelenburg.  +-----+---------------+---------+-----------+----------+--------------+ RIGHTCompressibilityPhasicitySpontaneityPropertiesThrombus Aging +-----+---------------+---------+-----------+----------+--------------+ CFV  Full           Yes      Yes                                 +-----+---------------+---------+-----------+----------+--------------+ SFJ  Full                                                        +-----+---------------+---------+-----------+----------+--------------+   +--------+---------------+---------+-----------+----------+--------------------+ LEFT    CompressibilityPhasicitySpontaneityPropertiesThrombus Aging       +--------+---------------+---------+-----------+----------+--------------------+ CFV     Full           Yes      Yes                                       +--------+---------------+---------+-----------+----------+--------------------+ SFJ     Full                                                              +--------+---------------+---------+-----------+----------+--------------------+ FV Prox Full                                                              +--------+---------------+---------+-----------+----------+--------------------+ FV Mid  Full                                                               +--------+---------------+---------+-----------+----------+--------------------+  FV                     Yes      Yes                  patent by color      Distal                                               doppler              +--------+---------------+---------+-----------+----------+--------------------+ PFV     Full                                                              +--------+---------------+---------+-----------+----------+--------------------+ POP     Full           Yes      Yes                                       +--------+---------------+---------+-----------+----------+--------------------+ PTV     Full                                                              +--------+---------------+---------+-----------+----------+--------------------+ PERO    Full                                                              +--------+---------------+---------+-----------+----------+--------------------+     Summary: RIGHT: - No evidence of common femoral vein obstruction.  LEFT: - There is no evidence of deep vein thrombosis in the lower extremity. However, portions of this examination were limited- see technologist comments above.  - No cystic structure found in the popliteal fossa.  *See table(s) above for measurements and observations. Electronically signed by Gerarda Fraction on 07/04/2023 at 3:40:36 PM.    Final    DG Chest Port 1 View  Result Date: 07/04/2023 CLINICAL DATA:  77 year old female with history of DVT EXAM: PORTABLE CHEST 1 VIEW COMPARISON:  11/23/2018 FINDINGS: Left rotation of the patient. Cardiomediastinal silhouette borderline enlarged in size and contour. No evidence of central vascular congestion. No interlobular septal thickening. Low lung volumes. No pneumothorax or pleural effusion. Coarsened interstitial markings, with no confluent airspace disease. Interval surgical changes of the right shoulder.  Degenerative changes of the left GH joint. Scoliotic curvature. IMPRESSION: Low lung volumes without evidence of acute cardiopulmonary disease. Electronically Signed   By: Gilmer Mor D.O.   On: 07/04/2023 15:27        Scheduled Meds:  apixaban  5 mg Oral BID   latanoprost  1 drop Both Eyes QHS   levothyroxine  50 mcg Oral Q0600   pantoprazole  40  mg Oral Daily   Continuous Infusions:  sodium chloride 100 mL/hr at 07/05/23 1603   cefTRIAXone (ROCEPHIN)  IV 1 g (07/06/23 0759)     LOS: 1 day    Time spent: 35 minutes    Dorcas Carrow, MD [facility]

## 2023-07-06 NOTE — Progress Notes (Signed)
Tricia Harrison 10:34 AM  Subjective: Patient doing better without signs of bleeding but still far away from her baseline and no pain no signs of bleeding and has not eaten her breakfast yet says she is not hungry and has no new complaints  Objective: Vital signs stable afebrile no acute distress abdomen is soft nontender C. difficile negative GI panel pending BUN and creatinine improved a little hemoglobin stable white count okay  Assessment: Multiple medical problems including seemingly resolved bright red blood per rectum and patient on Eliquis which is now on hold  Plan: When other medical problems are improved I would proceed with an oral only CT scan to rule out anything significant while we observe for signs of bleeding and I am happy to see back in the office as an outpatient and follow-up and will asked rounding partner to check on tomorrow  [facility]  office [phone] After 5PM or if no answer call [phone]

## 2023-07-06 NOTE — Plan of Care (Signed)
  Problem: Education: Goal: Knowledge of the prescribed therapeutic regimen will improve Outcome: Progressing Goal: Understanding of activity limitations/precautions following surgery will improve Outcome: Progressing Goal: Individualized Educational Video(s) Outcome: Progressing   Problem: Activity: Goal: Ability to tolerate increased activity will improve Outcome: Progressing   Problem: Pain Management: Goal: Pain level will decrease with appropriate interventions Outcome: Progressing   Problem: Education: Goal: Knowledge of General Education information will improve Description: Including pain rating scale, medication(s)/side effects and non-pharmacologic comfort measures Outcome: Progressing   Problem: Health Behavior/Discharge Planning: Goal: Ability to manage health-related needs will improve Outcome: Progressing   Problem: Clinical Measurements: Goal: Ability to maintain clinical measurements within normal limits will improve Outcome: Progressing Goal: Will remain free from infection Outcome: Progressing Goal: Diagnostic test results will improve Outcome: Progressing Goal: Respiratory complications will improve Outcome: Progressing Goal: Cardiovascular complication will be avoided Outcome: Progressing   Problem: Activity: Goal: Risk for activity intolerance will decrease Outcome: Progressing   Problem: Nutrition: Goal: Adequate nutrition will be maintained Outcome: Progressing   Problem: Coping: Goal: Level of anxiety will decrease Outcome: Progressing   Problem: Elimination: Goal: Will not experience complications related to bowel motility Outcome: Progressing Goal: Will not experience complications related to urinary retention Outcome: Progressing   Problem: Pain Managment: Goal: General experience of comfort will improve Outcome: Progressing   Problem: Safety: Goal: Ability to remain free from injury will improve Outcome: Progressing   Problem:  Skin Integrity: Goal: Risk for impaired skin integrity will decrease Outcome: Progressing   Problem: Education: Goal: Ability to describe self-care measures that may prevent or decrease complications (Diabetes Survival Skills Education) will improve Outcome: Progressing Goal: Individualized Educational Video(s) Outcome: Progressing   Problem: Coping: Goal: Ability to adjust to condition or change in health will improve Outcome: Progressing   Problem: Fluid Volume: Goal: Ability to maintain a balanced intake and output will improve Outcome: Progressing   Problem: Health Behavior/Discharge Planning: Goal: Ability to identify and utilize available resources and services will improve Outcome: Progressing Goal: Ability to manage health-related needs will improve Outcome: Progressing   Problem: Metabolic: Goal: Ability to maintain appropriate glucose levels will improve Outcome: Progressing   Problem: Nutritional: Goal: Maintenance of adequate nutrition will improve Outcome: Progressing Goal: Progress toward achieving an optimal weight will improve Outcome: Progressing   Problem: Skin Integrity: Goal: Risk for impaired skin integrity will decrease Outcome: Progressing   Problem: Tissue Perfusion: Goal: Adequacy of tissue perfusion will improve Outcome: Progressing

## 2023-07-06 NOTE — Evaluation (Signed)
Occupational Therapy Evaluation Patient Details Name: Tricia Harrison MRN: [id number] DOB: May 19, 1946 Today's Date: 07/06/2023   History of Present Illness Patient is a 77 year old female sent from her rehab facility due to decreased oral intake, several episodes of nausea and emesis earlier this week.  She has also been having hematochezia at the SNF. Pt recently s/p reverse R TSA on 06/17/23 and discharged to SNF for rehab.  PMH: DVT,CAD,CKD, HTN, LTKA, DM, PE   Clinical Impression   The pt is currently presenting significantly below her baseline level of functioning for ADL management, given the below listed deficits (see OT problem list). As such, she currently requires at least max assist for tasks, including rolling to the left in bed, upper body dressing, and toileting at bed level. She will benefit from further OT services to facilitate improved strengthening and progressive ADL performance. Without further OT services, she is at high risk for further immobility and progressive functional decline.  Patient will benefit from continued inpatient follow up therapy, <3 hours/day.       If plan is discharge home, recommend the following: Two people to help with walking and/or transfers;Assistance with cooking/housework;Direct supervision/assist for medications management;Assist for transportation;Help with stairs or ramp for entrance;Direct supervision/assist for financial management;A lot of help with bathing/dressing/bathroom    Functional Status Assessment  Patient has had a recent decline in their functional status and/or demonstrates limited ability to make significant improvements in function in a reasonable and predictable amount of time  Equipment Recommendations  Other (comment) (defer to next level of care)    Recommendations for Other Services       Precautions / Restrictions Precautions Precautions: Fall;Shoulder Type of Shoulder Precautions: no ROM shoulder, OK for  hand wrist and elbow ROM, per phone call on 7/24 with Dr.Landau patient is ok to use RUE on rollator for transfers and functional mobility. Shoulder Interventions: Shoulder sling/immobilizer;At all times;Off for dressing/bathing/exercises Required Braces or Orthoses: Sling Restrictions Weight Bearing Restrictions: Yes RUE Weight Bearing: Non weight bearing Other Position/Activity Restrictions: OK to WB on RW for transfers      Mobility Bed Mobility Overal bed mobility: Needs Assistance Bed Mobility: Rolling (Rolling to the left)     Supine to sit: Max assist, +2 for physical assistance, Used rails     General bed mobility comments: She further required total assist x2 for scooting to the head of the bed          ADL either performed or assessed with clinical judgement   ADL Overall ADL's : Needs assistance/impaired Eating/Feeding: Set up;Bed level   Grooming: Moderate assistance;Bed level   Upper Body Bathing: Maximal assistance;Bed level   Lower Body Bathing: Total assistance;Bed level   Upper Body Dressing : Maximal assistance;Bed level   Lower Body Dressing: Total assistance;Bed level       Toileting- Clothing Manipulation and Hygiene: Total assistance;Bed level                            Pertinent Vitals/Pain Pain Assessment Pain Location: no pain was reported during the session     Extremity/Trunk Assessment Upper Extremity Assessment Upper Extremity Assessment: Right hand dominant RUE Deficits / Details: Sling applied. Hand and wrist AROM appeared [facility]. Able to demo gross hand grasp LUE Deficits / Details: Severe shoulder flexion AROM limitations, which pt reported to be typical of her baseline. Generalized weakness. Elbow and hand AROM WFL   Lower Extremity Assessment  Lower Extremity Assessment: Generalized weakness RLE Deficits / Details: required AAROM for knee LLE Deficits / Details: required AAROM for knee       Communication  Communication Communication: Difficulty following commands/understanding (intermittently required repetition of prompts)   Cognition Arousal: Alert   Overall Cognitive Status: Difficult to assess Area of Impairment: Following commands, Attention                       Home Living Family/patient expects to be discharged to:: Skilled nursing facility Living Arrangements: Other relatives (Sister and son; pt's son works) Available Help at Discharge: Family Type of Home: House Home Access: Stairs to enter Secretary/administrator of Steps: 1   Home Layout: Two level;Able to live on main level with bedroom/bathroom     Bathroom Shower/Tub: Walk-in shower;Tub/shower unit         Home Equipment: Pharmacist, hospital (2 wheels);Cane - single point;BSC/3in1   Additional Comments: bedside commode is over toilet; she also has another one in her room      Prior Functioning/Environment Prior Level of Function : Needs assist             Mobility Comments: She has been with very limited mobility since R TSA in July. At home, she reported use of a rollator for household ambulation. ADLs Comments: Pt has a home health aide 5 days per week for 12 hours each day who assisted her with dressing, meal prep, laundry, and bathing.        OT Problem List: Decreased activity tolerance;Impaired balance (sitting and/or standing);Decreased coordination;Decreased knowledge of precautions;Decreased knowledge of use of DME or AE;Decreased strength;Decreased range of motion;Decreased cognition;Impaired UE functional use      OT Treatment/Interventions: Energy conservation;Self-care/ADL training;Therapeutic exercise;DME and/or AE instruction;Therapeutic activities;Patient/family education;Balance training;Cognitive remediation/compensation    OT Goals(Current goals can be found in the care plan section) Acute Rehab OT Goals Patient Stated Goal: to return home OT Goal Formulation: With  patient Time For Goal Achievement: 07/20/23 Potential to Achieve Goals: Fair ADL Goals Pt Will Perform Grooming: with contact guard assist;sitting Pt Will Perform Upper Body Bathing: with min assist;sitting Pt Will Perform Upper Body Dressing: with min assist;sitting Pt Will Transfer to Toilet: with min assist;stand pivot transfer;bedside commode Additional ADL Goal #1: Pt will perform bed mobility with min assist, in prep for progressive ADL participation.  OT Frequency: Min 1X/week       AM-PAC OT "6 Clicks" Daily Activity     Outcome Measure Help from another person eating meals?: A Little Help from another person taking care of personal grooming?: A Lot Help from another person toileting, which includes using toliet, bedpan, or urinal?: Total Help from another person bathing (including washing, rinsing, drying)?: A Lot Help from another person to put on and taking off regular upper body clothing?: A Lot Help from another person to put on and taking off regular lower body clothing?: Total 6 Click Score: 11   End of Session Equipment Utilized During Treatment: Other (comment) (N/A) Nurse Communication: Mobility status;Other (comment) (possible small areas of compromised skin integrity of perineum, pt request to use bed pan during session)  Activity Tolerance: Other (comment) (Fair tolerance. Pt limited by deconditioning and generalized weakness) Patient left: in bed;with call bell/phone within reach;with bed alarm set  OT Visit Diagnosis: Muscle weakness (generalized) (M62.81)                Time: 6010-9323 OT Time Calculation (min): 23 min Charges:  OT  General Charges $OT Visit: 1 Visit OT Evaluation $OT Eval Moderate Complexity: 1 Mod     L , OTR/L 07/06/2023, 3:21 PM

## 2023-07-07 DIAGNOSIS — N179 Acute kidney failure, unspecified: Secondary | ICD-10-CM | POA: Diagnosis not present

## 2023-07-07 LAB — BASIC METABOLIC PANEL WITH GFR
Anion gap: 8 (ref 5–15)
BUN: 57 mg/dL — ABNORMAL HIGH (ref 8–23)
CO2: 15 mmol/L — ABNORMAL LOW (ref 22–32)
Calcium: 7.7 mg/dL — ABNORMAL LOW (ref 8.9–10.3)
Chloride: 113 mmol/L — ABNORMAL HIGH (ref 98–111)
Creatinine, Ser: 1.93 mg/dL — ABNORMAL HIGH (ref 0.44–1.00)
GFR, Estimated: 26 mL/min — ABNORMAL LOW
Glucose, Bld: 97 mg/dL (ref 70–99)
Potassium: 3.7 mmol/L (ref 3.5–5.1)
Sodium: 136 mmol/L (ref 135–145)

## 2023-07-07 LAB — CULTURE, OB URINE

## 2023-07-07 LAB — GLUCOSE, CAPILLARY
Glucose-Capillary: 94 mg/dL (ref 70–99)
Glucose-Capillary: 112 mg/dL — ABNORMAL HIGH (ref 70–99)
Glucose-Capillary: 86 mg/dL (ref 70–99)
Glucose-Capillary: 79 mg/dL (ref 70–99)

## 2023-07-07 MED ORDER — PHENAZOPYRIDINE HCL 100 MG PO TABS: 100.0000 mg | ORAL_TABLET | Freq: Three times a day (TID) | ORAL | Status: DC

## 2023-07-07 MED ORDER — KETOROLAC TROMETHAMINE 0.5 % OP SOLN
1.0000 [drp] | Freq: Four times a day (QID) | OPHTHALMIC | Status: DC
  Administered 2023-07-07 – 2023-07-16 (×37): 1 [drp] via OPHTHALMIC
  Filled 2023-07-07: qty 5

## 2023-07-07 MED ORDER — CARVEDILOL 6.25 MG PO TABS
6.2500 mg | ORAL_TABLET | Freq: Every day | ORAL | Status: DC
  Administered 2023-07-07 – 2023-07-16 (×10): 6.25 mg via ORAL
  Filled 2023-07-07 (×10): qty 1

## 2023-07-07 MED ORDER — ROSUVASTATIN CALCIUM 20 MG PO TABS
20.0000 mg | ORAL_TABLET | Freq: Every evening | ORAL | Status: DC
  Administered 2023-07-07 – 2023-07-16 (×10): 20 mg via ORAL
  Filled 2023-07-07 (×10): qty 1

## 2023-07-07 MED ORDER — QUETIAPINE FUMARATE 25 MG PO TABS
25.0000 mg | ORAL_TABLET | Freq: Every day | ORAL | Status: DC
  Administered 2023-07-07 – 2023-07-15 (×9): 25 mg via ORAL
  Filled 2023-07-07 (×9): qty 1

## 2023-07-07 NOTE — Progress Notes (Signed)
PROGRESS NOTE    Tricia Harrison  ZDG:[id number] DOB: September 29, 1946 DOA: 07/04/2023 PCP: Darrow Bussing, MD    Brief Narrative:  77 year old with history of chronic anemia, chronic back pain, chronic constipation, CKD stage IIIb, hypertension and hyperlipidemia, hypothyroidism, dementia, history of pulmonary embolism currently on apixaban who recently underwent right shoulder replacement, significant postoperative delirium and confusion and was ultimately sent to skilled nursing rehab.  Patient was sent back from the facility with decreased oral intake, nausea and emesis as well as diarrhea along with stool mixed with blood after using laxatives.  She was also more confused than usual.  In the emergency room hemoglobin 8.9 at about baseline.  Creatinine 2.7 with baseline creatinine of 1.5-1.6.  Chest x-ray low lung volumes without evidence of acute cardiovascular disease.  99% on room air.  Found significantly dehydrated, complaint of diarrhea.  Admitted due to significant findings.   Assessment & Plan:   Acute kidney injury with chronic kidney disease stage IIIb: Baseline creatinine 1.5-1.6.  Presented with poor appetite, poor oral intake, placement to nursing home.  Also on valsartan. Continue gentle IV fluids.  Renal functions gradually improving.   Urine output is adequate.   Renal functions gradually stabilizing.   No evidence of urinary obstruction.  Avoid all valsartan.  Encourage oral intake.  Recheck levels tomorrow morning. Encourage oral dietary and fluid intake.  Acute UTI suspected present on admission: Urinalysis was abnormal.  Currently without evidence of urinary retention after initial catheterization.  Urine cultures are with multiple species.  Continue Rocephin.  Clinical evidence of UTI.  Will treat with 7 days of therapy on discharge.    Chronic constipation now with diarrhea Hematochezia, likely outlet bleeding Acute on chronic blood loss anemia. Known baseline  hemoglobin of 8-10.  Presented with hemoglobin of 8.9 with.  After rehydration her hemoglobin is 8.5. Continue to monitor levels. Diarrhea was likely related to use of laxatives, however possible to have diverticular disease. C. difficile and GI pathogen panel is negative. Eliquis restarted 8/11, will monitor for any evidence of bleeding.    Type 2 diabetes, diet controlled.  On carb modified diet. Essential hypertension, dehydrated.  Holding antihypertensives. Hypothyroidism, Synthroid.  Continue. GERD, on Prilosec.  History of pulmonary embolism on Eliquis: No evidence of further bleeding.  Will challenge with Eliquis while in the hospital.    Recent right shoulder surgery, difficult recovery.  Patient is having hard time recovering from shoulder surgery, postop delirium and debility.  Start working with PT OT.  Anticipate back to SNF.     DVT prophylaxis: SCDs Start: 07/04/23 1731 apixaban (ELIQUIS) tablet 5 mg   Code Status: Full code Family Communication: None today. Disposition Plan: Status is: Inpatient.  Remains inpatient due to significant diarrhea, rectal bleeding, IV fluids and IV antibiotics. Anticipate patient will be ready to go to SNF tomorrow.     Consultants:  GI  Procedures:  None  Antimicrobials:  Rocephin 8/11---   Subjective: Patient seen and examined.  Still burns to pee otherwise denies any other complaints.  She has some appetite back today.  Afebrile.  Still has occasional diarrhea but much improved than before.  Objective: Vitals:   07/06/23 2224 07/07/23 0606 07/07/23 1009 07/07/23 1334  BP: 124/78 (!) 142/80  (!) 159/84  Pulse: (!) 107 98  (!) 104  Resp: 18 18  20   Temp: 99 F (37.2 C) 98.3 F (36.8 C)  98.4 F (36.9 C)  TempSrc: Oral Oral  Oral  SpO2: 98%  100%  100%  Weight:   79 kg   Height:   5\' 1"  (1.549 m)     Intake/Output Summary (Last 24 hours) at 07/07/2023 1407 Last data filed at 07/07/2023 1000 Gross per 24 hour  Intake  3967.01 ml  Output --  Net 3967.01 ml   Filed Weights   07/07/23 1009  Weight: 79 kg    Examination:  General exam: Looks fairly stable.  Chronically sick looking.  Not in any distress. Patient is alert awake and mostly oriented.  She has no neurological deficits.  Moves all extremities equally. Respiratory system: No added sounds. Cardiovascular system: S1 & S2 heard, RRR.  No peripheral edema. Gastrointestinal system: Soft.  Nontender.  Bowel sounds present.     Data Reviewed: I have personally reviewed following labs and imaging studies  CBC: Recent Labs  Lab 07/04/23 1511 07/04/23 2116 07/05/23 0842 07/06/23 0731 07/07/23 0629  WBC 9.6  --  7.5 9.3 11.1*  NEUTROABS 6.0  --   --  6.4 7.0  HGB 8.9* 10.1* 8.5* 8.4* 8.3*  HCT 27.8* 31.2* 25.9* 26.2* 25.5*  MCV 95.2  --  94.9 94.9 94.8  PLT 539*  --  446* 482* 432*   Basic Metabolic Panel: Recent Labs  Lab 07/04/23 1511 07/05/23 0842 07/06/23 0731 07/07/23 0629  NA 133* 135 138 136  K 4.3 3.7 3.6 3.7  CL 106 107 112* 113*  CO2 18* 16* 16* 15*  GLUCOSE 135* 87 71 97  BUN 77* 68* 61* 57*  CREATININE 2.70* 2.18* 1.92* 1.93*  CALCIUM 8.1* 8.2* 7.9* 7.7*  MG  --   --   --  1.8   GFR: Estimated Creatinine Clearance: 23.2 mL/min (A) (by C-G formula based on SCr of 1.93 mg/dL (H)). Liver Function Tests: Recent Labs  Lab 07/04/23 1511 07/05/23 0842  AST 35 27  ALT 15 15  ALKPHOS 47 40  BILITOT 0.5 0.6  PROT 5.9* 5.5*  ALBUMIN 2.3* 2.4*   No results for input(s): "LIPASE", "AMYLASE" in the last 168 hours. No results for input(s): "AMMONIA" in the last 168 hours. Coagulation Profile: No results for input(s): "INR", "PROTIME" in the last 168 hours. Cardiac Enzymes: Recent Labs  Lab 07/04/23 2116  CKTOTAL 110   BNP (last 3 results) No results for input(s): "PROBNP" in the last 8760 hours. HbA1C: No results for input(s): "HGBA1C" in the last 72 hours. CBG: Recent Labs  Lab 07/06/23 1221  07/06/23 1653 07/06/23 2221 07/07/23 0742 07/07/23 1142  GLUCAP 106* 129* 130* 94 112*   Lipid Profile: No results for input(s): "CHOL", "HDL", "LDLCALC", "TRIG", "CHOLHDL", "LDLDIRECT" in the last 72 hours. Thyroid Function Tests: No results for input(s): "TSH", "T4TOTAL", "FREET4", "T3FREE", "THYROIDAB" in the last 72 hours. Anemia Panel: No results for input(s): "VITAMINB12", "FOLATE", "FERRITIN", "TIBC", "IRON", "RETICCTPCT" in the last 72 hours. Sepsis Labs: No results for input(s): "PROCALCITON", "LATICACIDVEN" in the last 168 hours.  Recent Results (from the past 240 hour(s))  C Difficile Quick Screen w PCR reflex     Status: None   Collection Time: 07/05/23 12:30 PM   Specimen: Rectum; Stool  Result Value Ref Range Status   C Diff antigen NEGATIVE NEGATIVE Final   C Diff toxin NEGATIVE NEGATIVE Final   C Diff interpretation No C. difficile detected.  Final    Comment: Performed at [facility], [address], [place], [place] [address]  Gastrointestinal Panel by PCR , Stool     Status: None  Collection Time: 07/05/23 12:30 PM   Specimen: Rectum; Stool  Result Value Ref Range Status   Campylobacter species NOT DETECTED NOT DETECTED Final   Plesimonas shigelloides NOT DETECTED NOT DETECTED Final   Salmonella species NOT DETECTED NOT DETECTED Final   Yersinia enterocolitica NOT DETECTED NOT DETECTED Final   Vibrio species NOT DETECTED NOT DETECTED Final   Vibrio cholerae NOT DETECTED NOT DETECTED Final   Enteroaggregative E coli (EAEC) NOT DETECTED NOT DETECTED Final   Enteropathogenic E coli (EPEC) NOT DETECTED NOT DETECTED Final   Enterotoxigenic E coli (ETEC) NOT DETECTED NOT DETECTED Final   Shiga like toxin producing E coli (STEC) NOT DETECTED NOT DETECTED Final   Shigella/Enteroinvasive E coli (EIEC) NOT DETECTED NOT DETECTED Final   Cryptosporidium NOT DETECTED NOT DETECTED Final   Cyclospora cayetanensis NOT DETECTED NOT DETECTED Final    Entamoeba histolytica NOT DETECTED NOT DETECTED Final   Giardia lamblia NOT DETECTED NOT DETECTED Final   Adenovirus F40/41 NOT DETECTED NOT DETECTED Final   Astrovirus NOT DETECTED NOT DETECTED Final   Norovirus GI/GII NOT DETECTED NOT DETECTED Final   Rotavirus A NOT DETECTED NOT DETECTED Final   Sapovirus (I, II, IV, and V) NOT DETECTED NOT DETECTED Final    Comment: Performed at [facility], [address] [place], [place] [address]  Culture, [place] Urine     Status: Abnormal   Collection Time: 07/05/23  3:00 PM   Specimen: Urine, Catheterized  Result Value Ref Range Status   Specimen Description   Final    URINE, CATHETERIZED Performed at [facility], [address], [place], [place] [address]    Special Requests   Final    URINE, RANDOM Performed at [facility], [address] [place], [place] [address]    Culture (A)  Final    MULTIPLE SPECIES PRESENT, SUGGEST RECOLLECTION NO GROUP B STREP (S.AGALACTIAE) ISOLATED Performed at [facility], [address] [place], [place] [address]    Report Status 07/07/2023 FINAL  Final  Urine Culture (for pregnant, neutropenic or urologic patients or patients with an indwelling urinary catheter)     Status: Abnormal   Collection Time: 07/05/23  3:00 PM   Specimen: Urine, Clean Catch  Result Value Ref Range Status   Specimen Description   Final    URINE, CLEAN CATCH Performed at [facility], [address] [place], [place] [address]    Special Requests   Final    NONE Performed at [facility], [address], [place], [place] [address]    Culture MULTIPLE SPECIES PRESENT, SUGGEST RECOLLECTION (A)  Final   Report Status 07/07/2023 FINAL  Final         Radiology Studies: No results found.      Scheduled Meds:  apixaban  5 mg Oral BID   carvedilol  6.25 mg Oral Daily   ketorolac  1 drop Both Eyes QID   latanoprost  1 drop Both  Eyes QHS   levothyroxine  50 mcg Oral Q0600   pantoprazole  40 mg Oral Daily   QUEtiapine  25 mg Oral QHS   rosuvastatin  20 mg Oral QPM   Continuous Infusions:  sodium chloride Stopped (07/07/23 0251)   cefTRIAXone (ROCEPHIN)  IV 1 g (07/07/23 1030)     LOS: 2 days    Time spent: 35 minutes    Dorcas Carrow, MD [facility]

## 2023-07-07 NOTE — Plan of Care (Signed)
  Problem: Education: Goal: Knowledge of the prescribed therapeutic regimen will improve Outcome: Progressing Goal: Understanding of activity limitations/precautions following surgery will improve Outcome: Progressing Goal: Individualized Educational Video(s) Outcome: Progressing   Problem: Activity: Goal: Ability to tolerate increased activity will improve Outcome: Progressing   

## 2023-07-07 NOTE — Progress Notes (Signed)
[facility] Gastroenterology Progress Note  Jessicaann Kozyra 77 y.o. 1946/02/01   Subjective: Denies any rectal bleeding since admit. Reports severe LLQ pain prior to admit that has improved. Family at bedside and reports she had mucousy bloody stools at nursing home prior to admit.  Objective: Vital signs: Vitals:   07/06/23 2224 07/07/23 0606  BP: 124/78 (!) 142/80  Pulse: (!) 107 98  Resp: 18 18  Temp: 99 F (37.2 C) 98.3 F (36.8 C)  SpO2: 98% 100%    Physical Exam: Gen: chronically ill-appearing, elderly, well-nourished, lethargic, no acute distress  HEENT: anicteric sclera CV: RRR Chest: CTA B Abd: diffuse tenderness (greatest in RLQ) with guarding, soft, nondistended, +BS Ext: no edema  Lab Results: Recent Labs    07/06/23 0731 07/07/23 0629  NA 138 136  K 3.6 3.7  CL 112* 113*  CO2 16* 15*  GLUCOSE 71 97  BUN 61* 57*  CREATININE 1.92* 1.93*  CALCIUM 7.9* 7.7*  MG  --  1.8   Recent Labs    07/04/23 1511 07/05/23 0842  AST 35 27  ALT 15 15  ALKPHOS 47 40  BILITOT 0.5 0.6  PROT 5.9* 5.5*  ALBUMIN 2.3* 2.4*   Recent Labs    07/06/23 0731 07/07/23 0629  WBC 9.3 11.1*  NEUTROABS 6.4 7.0  HGB 8.4* 8.3*  HCT 26.2* 25.5*  MCV 94.9 94.8  PLT 482* 432*      Assessment/Plan: Presentation of abdominal pain, diarrhea, and rectal bleeding concerning for ischemic colitis that is improving on Abx and IVFs. Would do oral contrast (no IV contrast) CT tomorrow and if patient unable to drink contrast then do as a non-contrast CT study to further evaluate. I do not think a sigmoidoscopy/colonoscopy is necessary. Apixaban resumed last night. Continue supportive care.    Shirley Friar 07/07/2023, 1:30 PM  Questions please call [phone] ID: Helaine Chess, female   DOB: 10/19/1946, 77 y.o.   MRN: [id number]

## 2023-07-07 NOTE — Plan of Care (Signed)
  Problem: Education: Goal: Knowledge of the prescribed therapeutic regimen will improve Outcome: Progressing Goal: Understanding of activity limitations/precautions following surgery will improve Outcome: Progressing Goal: Individualized Educational Video(s) Outcome: Progressing   Problem: Activity: Goal: Ability to tolerate increased activity will improve Outcome: Progressing   Problem: Pain Management: Goal: Pain level will decrease with appropriate interventions Outcome: Progressing   Problem: Education: Goal: Knowledge of General Education information will improve Description: Including pain rating scale, medication(s)/side effects and non-pharmacologic comfort measures Outcome: Progressing   Problem: Health Behavior/Discharge Planning: Goal: Ability to manage health-related needs will improve Outcome: Progressing   Problem: Clinical Measurements: Goal: Ability to maintain clinical measurements within normal limits will improve Outcome: Progressing Goal: Will remain free from infection Outcome: Progressing Goal: Diagnostic test results will improve Outcome: Progressing Goal: Respiratory complications will improve Outcome: Progressing Goal: Cardiovascular complication will be avoided Outcome: Progressing   Problem: Activity: Goal: Risk for activity intolerance will decrease Outcome: Progressing   Problem: Nutrition: Goal: Adequate nutrition will be maintained Outcome: Progressing   Problem: Coping: Goal: Level of anxiety will decrease Outcome: Progressing   Problem: Elimination: Goal: Will not experience complications related to bowel motility Outcome: Progressing Goal: Will not experience complications related to urinary retention Outcome: Progressing   Problem: Pain Managment: Goal: General experience of comfort will improve Outcome: Progressing   Problem: Safety: Goal: Ability to remain free from injury will improve Outcome: Progressing   Problem:  Skin Integrity: Goal: Risk for impaired skin integrity will decrease Outcome: Progressing   Problem: Education: Goal: Ability to describe self-care measures that may prevent or decrease complications (Diabetes Survival Skills Education) will improve Outcome: Progressing Goal: Individualized Educational Video(s) Outcome: Progressing   Problem: Coping: Goal: Ability to adjust to condition or change in health will improve Outcome: Progressing   Problem: Fluid Volume: Goal: Ability to maintain a balanced intake and output will improve Outcome: Progressing   Problem: Health Behavior/Discharge Planning: Goal: Ability to identify and utilize available resources and services will improve Outcome: Progressing Goal: Ability to manage health-related needs will improve Outcome: Progressing   Problem: Metabolic: Goal: Ability to maintain appropriate glucose levels will improve Outcome: Progressing   Problem: Nutritional: Goal: Maintenance of adequate nutrition will improve Outcome: Progressing Goal: Progress toward achieving an optimal weight will improve Outcome: Progressing   Problem: Skin Integrity: Goal: Risk for impaired skin integrity will decrease Outcome: Progressing   Problem: Tissue Perfusion: Goal: Adequacy of tissue perfusion will improve Outcome: Progressing

## 2023-07-08 ENCOUNTER — Inpatient Hospital Stay (HOSPITAL_COMMUNITY): Payer: Medicare HMO

## 2023-07-08 DIAGNOSIS — N179 Acute kidney failure, unspecified: Secondary | ICD-10-CM | POA: Diagnosis not present

## 2023-07-08 LAB — CBC WITH DIFFERENTIAL/PLATELET
Abs Immature Granulocytes: 0.05 10*3/uL (ref 0.00–0.07)
Basophils Absolute: 0 10*3/uL (ref 0.0–0.1)
Basophils Relative: 0 %
Eosinophils Absolute: 0.1 10*3/uL (ref 0.0–0.5)
Eosinophils Relative: 1 %
HCT: 26.4 % — ABNORMAL LOW (ref 36.0–46.0)
Hemoglobin: 8 g/dL — ABNORMAL LOW (ref 12.0–15.0)
Immature Granulocytes: 1 %
Lymphocytes Relative: 12 %
Lymphs Abs: 1.2 10*3/uL (ref 0.7–4.0)
MCH: 30 pg (ref 26.0–34.0)
MCHC: 30.3 g/dL (ref 30.0–36.0)
MCV: 98.9 fL (ref 80.0–100.0)
Monocytes Absolute: 1.9 10*3/uL — ABNORMAL HIGH (ref 0.1–1.0)
Monocytes Relative: 18 %
Neutro Abs: 7.2 10*3/uL (ref 1.7–7.7)
Neutrophils Relative %: 68 %
Platelets: 379 10*3/uL (ref 150–400)
RBC: 2.67 MIL/uL — ABNORMAL LOW (ref 3.87–5.11)
RDW: 15 % (ref 11.5–15.5)
WBC: 10.4 10*3/uL (ref 4.0–10.5)
nRBC: 0 % (ref 0.0–0.2)

## 2023-07-08 LAB — BASIC METABOLIC PANEL
Anion gap: 9 (ref 5–15)
BUN: 56 mg/dL — ABNORMAL HIGH (ref 8–23)
CO2: 15 mmol/L — ABNORMAL LOW (ref 22–32)
Calcium: 7.9 mg/dL — ABNORMAL LOW (ref 8.9–10.3)
Chloride: 112 mmol/L — ABNORMAL HIGH (ref 98–111)
Creatinine, Ser: 1.98 mg/dL — ABNORMAL HIGH (ref 0.44–1.00)
GFR, Estimated: 26 mL/min — ABNORMAL LOW (ref >60)
Glucose, Bld: 83 mg/dL (ref 70–99)
Potassium: 3.7 mmol/L (ref 3.5–5.1)
Sodium: 136 mmol/L (ref 135–145)

## 2023-07-08 LAB — GLUCOSE, CAPILLARY
Glucose-Capillary: 80 mg/dL (ref 70–99)
Glucose-Capillary: 67 mg/dL — ABNORMAL LOW (ref 70–99)
Glucose-Capillary: 80 mg/dL (ref 70–99)

## 2023-07-08 MED ORDER — IOHEXOL 9 MG/ML PO SOLN
500.0000 mL | ORAL | Status: AC
  Administered 2023-07-08: 500 mL via ORAL

## 2023-07-08 MED ORDER — IOHEXOL 9 MG/ML PO SOLN
ORAL | Status: AC
  Filled 2023-07-08: qty 1000

## 2023-07-08 NOTE — Progress Notes (Signed)
PROGRESS NOTE    Tricia Harrison  WGN:[id number] DOB: 03/13/1946 DOA: 07/04/2023 PCP: Eloisa Northern, MD    Brief Narrative:  77 year old with history of chronic anemia, chronic back pain, chronic constipation, CKD stage IIIb, hypertension and hyperlipidemia, hypothyroidism, dementia, history of pulmonary embolism currently on apixaban who recently underwent right shoulder replacement, significant postoperative delirium and confusion and was ultimately sent to skilled nursing rehab.  Patient was sent back from the facility with decreased oral intake, nausea and emesis as well as diarrhea along with stool mixed with blood after using laxatives.  She was also more confused than usual.  In the emergency room hemoglobin 8.9 at about baseline.  Creatinine 2.7 with baseline creatinine of 1.5-1.6.  Chest x-ray low lung volumes without evidence of acute cardiovascular disease.  99% on room air.  Found significantly dehydrated, complaint of diarrhea.  Admitted due to significant findings.   Assessment & Plan:   Acute kidney injury with chronic kidney disease stage IIIb: Baseline creatinine 1.5-1.6.  Presented with poor appetite, poor oral intake, placement to nursing home.  Also on valsartan. Continue gentle IV fluids.  Renal functions gradually improving and remains at about 1.9-2.  This may be her new baseline. Urine output is adequate.   No evidence of urinary obstruction.  Avoid all valsartan.  Encourage oral intake.  Recheck levels tomorrow morning. Encourage oral dietary and fluid intake.  Acute UTI suspected present on admission: Urinalysis was abnormal.  Currently without evidence of urinary retention after initial catheterization.  Urine cultures are with multiple species.  Continue Rocephin.  Clinical evidence of UTI.  Will treat with 7 days of therapy on discharge.    Chronic constipation now with diarrhea Hematochezia, likely outlet bleeding Acute on chronic blood loss anemia. Known  baseline hemoglobin of 8-10.  Presented with hemoglobin of 8.9 with.  After rehydration her hemoglobin is 8.5-8. Continue to monitor levels. Diarrhea was likely related to use of laxatives, however possible to have diverticular disease. C. difficile and GI pathogen panel is negative. Eliquis restarted 8/11, no evidence of ongoing bleeding. CT scan with oral contrast today.  Type 2 diabetes, diet controlled.  On carb modified diet. Essential hypertension, dehydrated.  Holding antihypertensives. Hypothyroidism, Synthroid.  Continue. GERD, on Prilosec.  History of pulmonary embolism on Eliquis: No evidence of further bleeding.  Tolerating Eliquis.     Recent right shoulder surgery, difficult recovery.  Start working with PT OT.  Anticipate back to SNF.  Continue to mobilize.  Speech evaluation today.  CT scan today.  Anticipate soon back to SNF for short term rehab.   DVT prophylaxis: SCDs Start: 07/04/23 1731 apixaban (ELIQUIS) tablet 5 mg   Code Status: Full code Family Communication: Patient's son on the phone. Disposition Plan: Status is: Inpatient.  IV fluids and IV antibiotics. Anticipate patient will be ready to go to SNF tomorrow.     Consultants:  GI  Procedures:  None  Antimicrobials:  Rocephin 8/11---   Subjective:  Patient seen and examined.  He still significant burning with urination but denies any other complaints.  She tells me she is eating fair today.  Denies any diarrhea but still gets occasional diarrhea after eating.  Denies any pain.  Nursing reported difficulty swallowing with coughing.  Objective: Vitals:   07/07/23 1009 07/07/23 1334 07/07/23 2127 07/08/23 0454  BP:  (!) 159/84 (!) 153/94 (!) 142/82  Pulse:  (!) 104 (!) 109 81  Resp:  20 16 18   Temp:  98.4 F (36.9  C) 98.7 F (37.1 C) 97.8 F (36.6 C)  TempSrc:  Oral Oral Oral  SpO2:  100% 97% 98%  Weight: 79 kg     Height: 5\' 1"  (1.549 m)       Intake/Output Summary (Last 24 hours) at  07/08/2023 1335 Last data filed at 07/08/2023 1100 Gross per 24 hour  Intake 360 ml  Output --  Net 360 ml   Filed Weights   07/07/23 1009  Weight: 79 kg    Examination:  General exam: Chronically sick looking.  Not in any distress.  Alert awake and oriented.  Flat affect.  Slow to respond.  No focal logical deficits. Respiratory system: No added sounds. Cardiovascular system: S1 & S2 heard, RRR.  No peripheral edema. Gastrointestinal system: Soft.  Nontender.  Bowel sounds present.     Data Reviewed: I have personally reviewed following labs and imaging studies  CBC: Recent Labs  Lab 07/04/23 1511 07/04/23 2116 07/05/23 0842 07/06/23 0731 07/07/23 0629 07/08/23 0740  WBC 9.6  --  7.5 9.3 11.1* 10.4  NEUTROABS 6.0  --   --  6.4 7.0 7.2  HGB 8.9* 10.1* 8.5* 8.4* 8.3* 8.0*  HCT 27.8* 31.2* 25.9* 26.2* 25.5* 26.4*  MCV 95.2  --  94.9 94.9 94.8 98.9  PLT 539*  --  446* 482* 432* 379   Basic Metabolic Panel: Recent Labs  Lab 07/04/23 1511 07/05/23 0842 07/06/23 0731 07/07/23 0629 07/08/23 0740  NA 133* 135 138 136 136  K 4.3 3.7 3.6 3.7 3.7  CL 106 107 112* 113* 112*  CO2 18* 16* 16* 15* 15*  GLUCOSE 135* 87 71 97 83  BUN 77* 68* 61* 57* 56*  CREATININE 2.70* 2.18* 1.92* 1.93* 1.98*  CALCIUM 8.1* 8.2* 7.9* 7.7* 7.9*  MG  --   --   --  1.8  --    GFR: Estimated Creatinine Clearance: 22.7 mL/min (A) (by C-G formula based on SCr of 1.98 mg/dL (H)). Liver Function Tests: Recent Labs  Lab 07/04/23 1511 07/05/23 0842  AST 35 27  ALT 15 15  ALKPHOS 47 40  BILITOT 0.5 0.6  PROT 5.9* 5.5*  ALBUMIN 2.3* 2.4*   No results for input(s): "LIPASE", "AMYLASE" in the last 168 hours. No results for input(s): "AMMONIA" in the last 168 hours. Coagulation Profile: No results for input(s): "INR", "PROTIME" in the last 168 hours. Cardiac Enzymes: Recent Labs  Lab 07/04/23 2116  CKTOTAL 110   BNP (last 3 results) No results for input(s): "PROBNP" in the last 8760  hours. HbA1C: No results for input(s): "HGBA1C" in the last 72 hours. CBG: Recent Labs  Lab 07/07/23 1142 07/07/23 1720 07/07/23 2136 07/08/23 0735 07/08/23 1205  GLUCAP 112* 86 79 80 67*   Lipid Profile: No results for input(s): "CHOL", "HDL", "LDLCALC", "TRIG", "CHOLHDL", "LDLDIRECT" in the last 72 hours. Thyroid Function Tests: No results for input(s): "TSH", "T4TOTAL", "FREET4", "T3FREE", "THYROIDAB" in the last 72 hours. Anemia Panel: No results for input(s): "VITAMINB12", "FOLATE", "FERRITIN", "TIBC", "IRON", "RETICCTPCT" in the last 72 hours. Sepsis Labs: No results for input(s): "PROCALCITON", "LATICACIDVEN" in the last 168 hours.  Recent Results (from the past 240 hour(s))  C Difficile Quick Screen w PCR reflex     Status: None   Collection Time: 07/05/23 12:30 PM   Specimen: Rectum; Stool  Result Value Ref Range Status   C Diff antigen NEGATIVE NEGATIVE Final   C Diff toxin NEGATIVE NEGATIVE Final   C Diff interpretation No C.  difficile detected.  Final    Comment: Performed at [facility], [address] [place], [place] [address]  Gastrointestinal Panel by PCR , Stool     Status: None   Collection Time: 07/05/23 12:30 PM   Specimen: Rectum; Stool  Result Value Ref Range Status   Campylobacter species NOT DETECTED NOT DETECTED Final   Plesimonas shigelloides NOT DETECTED NOT DETECTED Final   Salmonella species NOT DETECTED NOT DETECTED Final   Yersinia enterocolitica NOT DETECTED NOT DETECTED Final   Vibrio species NOT DETECTED NOT DETECTED Final   Vibrio cholerae NOT DETECTED NOT DETECTED Final   Enteroaggregative E coli (EAEC) NOT DETECTED NOT DETECTED Final   Enteropathogenic E coli (EPEC) NOT DETECTED NOT DETECTED Final   Enterotoxigenic E coli (ETEC) NOT DETECTED NOT DETECTED Final   Shiga like toxin producing E coli (STEC) NOT DETECTED NOT DETECTED Final   Shigella/Enteroinvasive E coli (EIEC) NOT DETECTED NOT DETECTED Final    Cryptosporidium NOT DETECTED NOT DETECTED Final   Cyclospora cayetanensis NOT DETECTED NOT DETECTED Final   Entamoeba histolytica NOT DETECTED NOT DETECTED Final   Giardia lamblia NOT DETECTED NOT DETECTED Final   Adenovirus F40/41 NOT DETECTED NOT DETECTED Final   Astrovirus NOT DETECTED NOT DETECTED Final   Norovirus GI/GII NOT DETECTED NOT DETECTED Final   Rotavirus A NOT DETECTED NOT DETECTED Final   Sapovirus (I, II, IV, and V) NOT DETECTED NOT DETECTED Final    Comment: Performed at [facility], [address] [place], [place] [address]  Culture, [place] Urine     Status: Abnormal   Collection Time: 07/05/23  3:00 PM   Specimen: Urine, Catheterized  Result Value Ref Range Status   Specimen Description   Final    URINE, CATHETERIZED Performed at [facility], [address] [place], [place] [address]    Special Requests   Final    URINE, RANDOM Performed at [facility], [address] [place], [place] [address]    Culture (A)  Final    MULTIPLE SPECIES PRESENT, SUGGEST RECOLLECTION NO GROUP B STREP (S.AGALACTIAE) ISOLATED Performed at [facility], [address], [place], [place] [address]    Report Status 07/07/2023 FINAL  Final  Urine Culture (for pregnant, neutropenic or urologic patients or patients with an indwelling urinary catheter)     Status: Abnormal   Collection Time: 07/05/23  3:00 PM   Specimen: Urine, Clean Catch  Result Value Ref Range Status   Specimen Description   Final    URINE, CLEAN CATCH Performed at [facility], [address] [place], [place] [address]    Special Requests   Final    NONE Performed at [facility], [address] [place], [place] [address]    Culture MULTIPLE SPECIES PRESENT, SUGGEST RECOLLECTION (A)  Final   Report Status 07/07/2023 FINAL  Final         Radiology Studies: No results found.      Scheduled Meds:  apixaban   5 mg Oral BID   carvedilol  6.25 mg Oral Daily   ketorolac  1 drop Both Eyes QID   latanoprost  1 drop Both Eyes QHS   levothyroxine  50 mcg Oral Q0600   pantoprazole  40 mg Oral Daily   QUEtiapine  25 mg Oral QHS   rosuvastatin  20 mg Oral QPM   Continuous Infusions:  sodium chloride 75 mL/hr at 07/07/23 2352  cefTRIAXone (ROCEPHIN)  IV 1 g (07/08/23 1228)     LOS: 3 days    Time spent: 35 minutes    Dorcas Carrow, MD [facility]

## 2023-07-08 NOTE — Care Management Important Message (Signed)
Important Message  Patient Details IM Letter given. Name: Tricia Harrison MRN: [id number] Date of Birth: 08/06/1946   Medicare Important Message Given:  Yes     [name] 07/08/2023, 11:03 AM

## 2023-07-08 NOTE — H&P (View-Only) (Signed)
[facility] Gastroenterology Progress Note  Tricia Harrison 77 y.o. [birth year]   Subjective: Reports severe abdominal pain earlier this morning that has improved. Feels ok.  Objective: Vital signs: Vitals:   07/08/23 0454 07/08/23 1353  BP: (!) 142/82 (!) 158/93  Pulse: 81 94  Resp: 18 18  Temp: 97.8 F (36.6 C) 99.3 F (37.4 C)  SpO2: 98% 99%    Physical Exam: Gen: lethargic, elderly, chronically ill-appearing, no acute distress  HEENT: anicteric sclera CV: RRR Chest: CTA B Abd: epigastric tenderness with guarding, soft, nondistended, +BS Ext: no edema  Lab Results: Recent Labs    07/07/23 0629 07/08/23 0740  NA 136 136  K 3.7 3.7  CL 113* 112*  CO2 15* 15*  GLUCOSE 97 83  BUN 57* 56*  CREATININE 1.93* 1.98*  CALCIUM 7.7* 7.9*  MG 1.8  --    No results for input(s): "AST", "ALT", "ALKPHOS", "BILITOT", "PROT", "ALBUMIN" in the last 72 hours. Recent Labs    07/07/23 0629 07/08/23 0740  WBC 11.1* 10.4  NEUTROABS 7.0 7.2  HGB 8.3* 8.0*  HCT 25.5* 26.4*  MCV 94.8 98.9  PLT 432* 379      Assessment/Plan: Suspected ischemic colitis - CT scan pending. Clinically improving. Continue supportive care. Continue Abx. Will f/u.   Tricia Harrison 07/08/2023, 1:54 PM  Questions please call [phone] ID: Tricia Harrison, female   DOB: 01-01-1946, 77 y.o.   MRN: [id number]

## 2023-07-08 NOTE — Progress Notes (Signed)
[facility] Gastroenterology Progress Note  Tricia Harrison 77 y.o. [birth year]   Subjective: Reports severe abdominal pain earlier this morning that has improved. Feels ok.  Objective: Vital signs: Vitals:   07/08/23 0454 07/08/23 1353  BP: (!) 142/82 (!) 158/93  Pulse: 81 94  Resp: 18 18  Temp: 97.8 F (36.6 C) 99.3 F (37.4 C)  SpO2: 98% 99%    Physical Exam: Gen: lethargic, elderly, chronically ill-appearing, no acute distress  HEENT: anicteric sclera CV: RRR Chest: CTA B Abd: epigastric tenderness with guarding, soft, nondistended, +BS Ext: no edema  Lab Results: Recent Labs    07/07/23 0629 07/08/23 0740  NA 136 136  K 3.7 3.7  CL 113* 112*  CO2 15* 15*  GLUCOSE 97 83  BUN 57* 56*  CREATININE 1.93* 1.98*  CALCIUM 7.7* 7.9*  MG 1.8  --    No results for input(s): "AST", "ALT", "ALKPHOS", "BILITOT", "PROT", "ALBUMIN" in the last 72 hours. Recent Labs    07/07/23 0629 07/08/23 0740  WBC 11.1* 10.4  NEUTROABS 7.0 7.2  HGB 8.3* 8.0*  HCT 25.5* 26.4*  MCV 94.8 98.9  PLT 432* 379      Assessment/Plan: Suspected ischemic colitis - CT scan pending. Clinically improving. Continue supportive care. Continue Abx. Will f/u.   Shirley Friar 07/08/2023, 1:54 PM  Questions please call [phone] ID: Tricia Harrison, female   DOB: 01-01-1946, 77 y.o.   MRN: [id number]

## 2023-07-08 NOTE — Plan of Care (Signed)
  Problem: Education: °Goal: Knowledge of the prescribed therapeutic regimen will improve °Outcome: Progressing °Goal: Understanding of activity limitations/precautions following surgery will improve °Outcome: Progressing °Goal: Individualized Educational Video(s) °Outcome: Progressing °  °

## 2023-07-08 NOTE — TOC Initial Note (Signed)
Transition of Care [facility]) - Initial/Assessment Note    Patient Details  Name: Tricia Harrison MRN: [id number] Date of Birth: [birth year]  [facility] CM/SW Contact:    Beckie Busing, RN Phone [phone]  07/08/2023, 4:24 PM  Clinical Narrative:                 TOC following patient admitted from [facility]. Patient at [facility] for short term rehab and plan is to return. CM confirmed with Britney in admissions that patient is able to return. Family currently has a bed on hold but TOC will need new insurance auth.  Family at bedside concerned because patient has some blood in foley and requesting that MD be notified. Message has been sent to MD who confirms that patient will remain inpatient overnight and re evaluate tomorrow. TOC will initiate insurance auth. TOC will continue to follow.   Expected Discharge Plan: Skilled Nursing Facility Barriers to Discharge: Continued Medical Work up   Patient Goals and CMS Choice Patient states their goals for this hospitalization and ongoing recovery are:: Return to SNF for rehab CMS Medicare.gov Compare Post Acute Care list provided to::  (n/a) Choice offered to / list presented to : NA [place] ownership interest in [facility].provided to::  (n/a)    Expected Discharge Plan and Services In-house Referral: NA Discharge Planning Services: CM Consult Post Acute Care Choice: Skilled Nursing Facility Living arrangements for the past 2 months: Single Family Home                 DME Arranged: N/A         HH Arranged: NA HH Agency: NA        Prior Living Arrangements/Services Living arrangements for the past 2 months: Single Family Home   Patient language and need for interpreter reviewed:: Yes Do you feel safe going back to the place where you live?: Yes      Need for Family Participation in Patient Care: Yes (Comment) Care giver support system in place?: Yes (comment) Current home services:   (n/a) Criminal Activity/Legal Involvement Pertinent to Current Situation/Hospitalization: No - Comment as needed  Activities of Daily Living Home Assistive Devices/Equipment: Bedside commode/3-in-1, Brace (specify type), Sling (specify type) ADL Screening (condition at time of admission) Patient's cognitive ability adequate to safely complete daily activities?: Yes Is the patient deaf or have difficulty hearing?: No Does the patient have difficulty seeing, even when wearing glasses/contacts?: No Does the patient have difficulty concentrating, remembering, or making decisions?: No Patient able to express need for assistance with ADLs?: Yes Does the patient have difficulty dressing or bathing?: No Independently performs ADLs?: No Communication: Independent Dressing (OT): Needs assistance Is this a change from baseline?: Pre-admission baseline Grooming: Needs assistance Is this a change from baseline?: Pre-admission baseline Feeding: Independent Bathing: Needs assistance Is this a change from baseline?: Pre-admission baseline Toileting: Needs assistance Is this a change from baseline?: Pre-admission baseline In/Out Bed: Needs assistance Is this a change from baseline?: Pre-admission baseline Walks in Home: Needs assistance Is this a change from baseline?: Pre-admission baseline Does the patient have difficulty walking or climbing stairs?: Yes Weakness of Legs: Both Weakness of Arms/Hands: Right  Permission Sought/Granted Permission sought to share information with : Family Supports                Emotional Assessment Appearance:: Appears stated age Attitude/Demeanor/Rapport: Gracious, Engaged Affect (typically observed): Accepting Orientation: :  (patient did not respond to questions) Alcohol /  Substance Use: Not Applicable Psych Involvement: No (comment)  Admission diagnosis:  AKI (acute kidney injury) (HCC) [N17.9] Acute kidney injury (AKI) with acute tubular necrosis  (ATN) (HCC) [N17.0] Patient Active Problem List   Diagnosis Date Noted   Acute kidney injury (AKI) with acute tubular necrosis (ATN) (HCC) 07/05/2023   AKI (acute kidney injury) (HCC) 07/04/2023   Thrombocytosis 07/04/2023   Gallstones 07/04/2023   Hemorrhoid 07/04/2023   Scoliosis 07/04/2023   Acute on chronic blood loss anemia 07/04/2023   Grade I diastolic dysfunction 07/04/2023   S/P reverse total shoulder arthroplasty, 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   Anemia 06/16/2023   Chronic constipation 02/04/2022   Uses wheelchair 01/14/2022   Velopharyngeal dysfunction with mild swallowing difficulty 01/14/2022   Uses roller walker 01/11/2022   Atherosclerosis of aorta (HCC) 01/11/2022   Chronic kidney disease, stage 3b (HCC) 01/11/2022   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   Orthostasis 01/11/2022   Leukocytosis 01/11/2022   Chronic cholecystitis 01/10/2022   Obstructive sleep apnea 03/09/2019   Aortic regurgitation 12/02/2018   History of pulmonary embolism 08/20/2018   DOE (dyspnea on exertion)    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   Bilateral ovarian cysts    Hx of gallstones    Hernia, hiatal    Renal cysts, acquired, bilateral    GERD (gastroesophageal reflux disease)    Diverticulosis    Lumbar spinal stenosis    Type 2 diabetes mellitus (HCC)    Left knee DJD    Rapid palpitations 12/06/2013   Morbid obesity (HCC) 12/06/2013   Fatigue 12/06/2013   PVC (premature ventricular contraction) 12/06/2013   Essential hypertension    Hypothyroidism    Hyperlipidemia LDL goal <100    PCP:  Eloisa Northern, MD Pharmacy:   [id number] - [place], [place] - [address] [place] [address] Phone: [phone] Fax: [phone]  [facility]) - [place], [place] - [address] [facility] [address] [place] [address] [place] [address] Phone: [phone] Fax: [phone]  [facility] - [place], [place] - [address] [place] [address] Phone: [phone] Fax: [phone]     Social Determinants of Health (SDOH) Social History: SDOH Screenings   Food Insecurity: No Food Insecurity (07/05/2023)  Housing: Low Risk  (07/05/2023)  Transportation Needs: No Transportation Needs (07/05/2023)  Utilities: Not At Risk (07/05/2023)  Depression (PHQ2-9): Low Risk  (11/09/2018)  Tobacco Use: Medium Risk (07/05/2023)   SDOH Interventions:     Readmission Risk Interventions    07/08/2023    4:14 PM  Readmission Risk Prevention Plan  Transportation Screening Complete  PCP or Specialist Appt within 5-7 Days Complete  Home Care Screening Complete  Medication Review (RN CM) Complete

## 2023-07-08 NOTE — Evaluation (Signed)
Clinical/Bedside Swallow Evaluation Patient Details  Name: Tricia Harrison MRN: [id number] Date of Birth: 08-24-1946  Today's Date: 07/08/2023 Time: SLP Start Time (ACUTE ONLY): 1340 SLP Stop Time (ACUTE ONLY): 1400 SLP Time Calculation (min) (ACUTE ONLY): 20 min  Past Medical History:  Past Medical History:  Diagnosis Date   Anemia    as a child   Anginal pain (HCC)    Asthma    as a child   Bladder incontinence    Chronic back pain    spinal stenosis and buldging disc;scoliosis   Chronic constipation    occasionally takes something OTC   Chronic kidney disease    Diabetes mellitus without complication (HCC)    per pt "Pre" for 20+yrs   Diverticulosis    DVT (deep venous thrombosis) (HCC)    Dyspnea    occasional   Gallstones    has known about this for 3-26yrs   GERD (gastroesophageal reflux disease)    takes Omeprazole daily   Glaucoma    H/O hiatal hernia    Heart murmur    Hemorrhoids    History of blood transfusion    no abnormal reaction noted   History of bronchitis    states its been a long time ago   History of gout    HLD (hyperlipidemia)    takes Fenofibrate daily   HTN (hypertension)    takes Diltiazem and Ramipril daily   Hypothyroidism    takes Synthroid daily   Left knee DJD    Macular degeneration    Nocturia    Palpitations    started in 2013 and only occasionally;last time noticed about 2-3wks ago and after drinking caffeine   Peripheral vascular disease (HCC)    PONV (postoperative nausea and vomiting)    problems swallowing after anethesia- 2/2023and slurred speech also   Pulmonary embolism with acute cor pulmonale (HCC) 07/2018   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   Sleep apnea    does not uses cpap   Past Surgical History:  Past Surgical History:  Procedure Laterality Date   3-day EVENT MONITOR  01/2019   Mostly normal monitor.  Predominantly sinus rhythm  (rate range 48-105 bpm, average 67 bpm) 9 short atrial runs (fastest = 13 beats rate 135 bpm, longest ~17 seconds-101 bpm) -> not noted on symptom log.  No sustained arrhythmias (tachycardia or bradycardia), or pauses..  Symptoms are noted with PVCs.   bilateral cataract surgery      CHOLECYSTECTOMY N/A 01/10/2022   Procedure: LAPAROSCOPIC CHOLECYSTECTOMY WITH INTRAOPERATIVE CHOLANGIOGRAM;  Surgeon: Karie Soda, MD;  Location: [facility];  Service: General;  Laterality: N/A;   COLONOSCOPY     CT ANGIOGRAM OF CHEST - PE PROTOCOL  07/2018   Bilateral nonocclusive pulmonary emboli evidence of right heart strain consistent with "submassive "/intermediate PE.  -->  Code PE called.   DILATION AND CURETTAGE OF UTERUS     multiple about 6-7 times   ESOPHAGOGASTRODUODENOSCOPY     NM VQ LUNG SCAN ([facility] HX)  11/23/2018   Low probability for PE   REVERSE SHOULDER ARTHROPLASTY Right 06/17/2023   Procedure: RIGHT REVERSE SHOULDER ARTHROPLASTY;  Surgeon: Teryl Lucy, MD;  Location: [facility];  Service: Orthopedics;  Laterality: Right;   RIGHT/LEFT HEART CATH AND CORONARY ANGIOGRAPHY N/A 08/19/2018   Procedure: RIGHT/LEFT HEART CATH AND CORONARY ANGIOGRAPHY;  Surgeon: Corky Crafts, MD;  Location: [facility] INVASIVE CV LAB;  Service: Cardiovascular:: Nonobstructive CAD.  Mild pulmonary pretension.  Mean PA pressure 26 mmHg with PA P 48/20 mmHg.  Normal LVEDP.   THYROID SURGERY Right 2010   TOTAL HIP ARTHROPLASTY Left 2005   TOTAL KNEE ARTHROPLASTY Left 02/21/2014   Procedure: TOTAL KNEE ARTHROPLASTY;  Surgeon: Nilda Simmer, MD;  Location: [facility];  Service: Orthopedics;  Laterality: Left;   TRANSTHORACIC ECHOCARDIOGRAM  07/2018    (In setting of acute PE) mild LVH.  EF 55 to 60%.  GR 1 DD.  Aortic sclerosis but no stenosis.  Mild regurgitation.  Mild RA dilation.  Moderate LA dilation.  Moderate tricuspid regurgitation with severe primary hypertension estimated PA pressure 71 mmHg.  RV poorly visualized    TRANSTHORACIC ECHOCARDIOGRAM  10/2018 - 11/2019   a) EF 60-65%.  GR 1 DD.  Focal basal LVH. Mod AI/ no AS. Mild LA dilation.  Normal RV size and fxn.  Mod PI w/ mild TR.  Peak PAP ~ 38 mmHg; b) Normal EF 60 to 65%.  Moderate LVH-GR 1 DD.  R WMA.  Mild AS w/ trivial AI.  Grossly normal PA, RV and RA size.  Normal RV function.  Normal PA pressures.   TRANSTHORACIC ECHOCARDIOGRAM  01/2021    EF 55%.  Normal LV function.  Mild LVH.  GR 1 DD.  Normal PA pressures.  Severe LA dilation.  (Consistent with more significant diastolic dysfunction).  Mild-possibly mild to moderate aortic insufficiency.  Borderline elevated RAP.  Trivial pericardial effusion.  (Stable)   VAGINAL HYSTERECTOMY  19725   HPI:  77 year old with history of chronic anemia, chronic back pain, chronic constipation, CKD stage IIIb, hypertension and hyperlipidemia, hypothyroidism, dementia, history of pulmonary embolism recently underwent right shoulder replacement, significant postoperative delirium and confusion and was ultimately sent to skilled nursing rehab.  Patient was sent back from the facility with decreased oral intake, nausea and emesis as well as diarrhea along with stool mixed with blood after using laxatives.   Chest x-ray low lung volumes without evidence of acute cardiovascular disease. BSE with possible velopharyngeal dysfunction with hypernasality and complaints of nasal regugitation. MBS 01/14/22 without penetration or aspiration but possible decreased UES opening with barium pooled above UES. Continue Dys 3/thin.    Assessment / Plan / Recommendation  Clinical Impression  Pt's swallowing presents similar as during her admission in 12/2021. There is suspected velopharyngeal involvement given her hypernasality vocal quality, complaints of nasal regurgitation and there is a consistent clicking sound/quality when she swallows. Her soft palate elevation however on examination, appears functional bilaterally. Orally she transited  boluses without difficulty.  During her MBS in 12/2021 there was pooling of secretions above her UES. Today she had multiple (3-4) and consistent swallows after most boluses suggestive of pharyngeal or pharyngoesophageal residue. Pt did not cough or throat clear with any consistency and aspiration not suspected during assessment.  Evaluation with an MBS is warranted to further assess. Recommend she continue on regular diet, thin liquids, alternating liquids and solids, multiple swallows, crush pills until MBS tomorrow. SLP Visit Diagnosis: Dysphagia, unspecified (R13.10)    Aspiration Risk  Mild aspiration risk    Diet Recommendation Regular;Thin liquid    Liquid Administration via: Cup;Straw Medication Administration: Crushed with puree Supervision: Staff to assist with self feeding;Full supervision/cueing for compensatory strategies Compensations: Slow rate;Small sips/bites Postural Changes: Seated upright at 90 degrees    Other  Recommendations Oral Care Recommendations: Oral care BID    Recommendations for follow up therapy are one  component of a multi-disciplinary discharge planning process, led by the attending physician.  Recommendations may be updated based on patient status, additional functional criteria and insurance authorization.  Follow up Recommendations        Assistance Recommended at Discharge    Functional Status Assessment    Frequency and Duration            Prognosis        Swallow Study   General HPI: 77 year old with history of chronic anemia, chronic back pain, chronic constipation, CKD stage IIIb, hypertension and hyperlipidemia, hypothyroidism, dementia, history of pulmonary embolism recently underwent right shoulder replacement, significant postoperative delirium and confusion and was ultimately sent to skilled nursing rehab.  Patient was sent back from the facility with decreased oral intake, nausea and emesis as well as diarrhea along with stool mixed  with blood after using laxatives.   Chest x-ray low lung volumes without evidence of acute cardiovascular disease. BSE with possible velopharyngeal dysfunction with hypernasality and complaints of nasal regugitation. MBS 01/14/22 without penetration or aspiration but possible decreased UES opening with barium pooled above UES. Continue Dys 3/thin. Type of Study: Bedside Swallow Evaluation Previous Swallow Assessment:  (see HPI) Diet Prior to this Study: Regular;Thin liquids (Level 0) Temperature Spikes Noted: No Respiratory Status: Room air History of Recent Intubation: No Behavior/Cognition: Alert;Cooperative;Pleasant mood Oral Cavity Assessment: Within Functional Limits Oral Care Completed by SLP: No Oral Cavity - Dentition: Adequate natural dentition Vision: Functional for self-feeding Self-Feeding Abilities: Needs assist Patient Positioning: Upright in bed Baseline Vocal Quality: Other (comment) (hypernasal) Volitional Cough: Strong Volitional Swallow: Able to elicit    Oral/Motor/Sensory Function Overall Oral Motor/Sensory Function: Within functional limits (soft palate appeared to elevate bilaterally)   Ice Chips Ice chips: Not tested   Thin Liquid Thin Liquid: Impaired Presentation: Straw Pharyngeal  Phase Impairments: Multiple swallows;Other (comments) (clicking sound- velopharyngeal involvement?)    Nectar Thick Nectar Thick Liquid: Not tested   Honey Thick Honey Thick Liquid: Not tested   Puree Puree: Impaired Oral Phase Impairments:  (none) Pharyngeal Phase Impairments: Multiple swallows   Solid     Solid: Impaired Pharyngeal Phase Impairments: Multiple swallows      Roque Cash Breck Coons 07/08/2023,2:44 PM

## 2023-07-09 ENCOUNTER — Inpatient Hospital Stay (HOSPITAL_COMMUNITY): Payer: Medicare HMO

## 2023-07-09 ENCOUNTER — Inpatient Hospital Stay (HOSPITAL_COMMUNITY): Payer: Medicare HMO | Admitting: Anesthesiology

## 2023-07-09 ENCOUNTER — Encounter (HOSPITAL_COMMUNITY): Payer: Self-pay | Admitting: Internal Medicine

## 2023-07-09 ENCOUNTER — Encounter (HOSPITAL_COMMUNITY)
Admission: EM | Disposition: A | Discharge status: Skilled Nursing Facility | Payer: Self-pay | Source: Skilled Nursing Facility | Attending: Internal Medicine

## 2023-07-09 DIAGNOSIS — K625 Hemorrhage of anus and rectum: Secondary | ICD-10-CM | POA: Diagnosis present

## 2023-07-09 DIAGNOSIS — Z87891 Personal history of nicotine dependence: Secondary | ICD-10-CM

## 2023-07-09 DIAGNOSIS — N179 Acute kidney failure, unspecified: Secondary | ICD-10-CM | POA: Diagnosis not present

## 2023-07-09 DIAGNOSIS — K649 Unspecified hemorrhoids: Secondary | ICD-10-CM

## 2023-07-09 DIAGNOSIS — N1832 Chronic kidney disease, stage 3b: Secondary | ICD-10-CM

## 2023-07-09 DIAGNOSIS — R933 Abnormal findings on diagnostic imaging of other parts of digestive tract: Secondary | ICD-10-CM | POA: Clinically undetermined

## 2023-07-09 DIAGNOSIS — K5730 Diverticulosis of large intestine without perforation or abscess without bleeding: Secondary | ICD-10-CM

## 2023-07-09 DIAGNOSIS — I129 Hypertensive chronic kidney disease with stage 1 through stage 4 chronic kidney disease, or unspecified chronic kidney disease: Secondary | ICD-10-CM

## 2023-07-09 HISTORY — PX: FLEXIBLE SIGMOIDOSCOPY: SHX5431

## 2023-07-09 LAB — GLUCOSE, CAPILLARY
Glucose-Capillary: 68 mg/dL — ABNORMAL LOW (ref 70–99)
Glucose-Capillary: 67 mg/dL — ABNORMAL LOW (ref 70–99)
Glucose-Capillary: 97 mg/dL (ref 70–99)
Glucose-Capillary: 78 mg/dL (ref 70–99)
Glucose-Capillary: 78 mg/dL (ref 70–99)

## 2023-07-09 SURGERY — SIGMOIDOSCOPY, FLEXIBLE
Anesthesia: Monitor Anesthesia Care

## 2023-07-09 MED ORDER — POLYETHYLENE GLYCOL 3350 17 G PO PACK
17.0000 g | PACK | Freq: Every day | ORAL | Status: DC
  Administered 2023-07-11 – 2023-07-16 (×6): 17 g via ORAL
  Filled 2023-07-09 (×6): qty 1

## 2023-07-09 MED ORDER — PROPOFOL 500 MG/50ML IV EMUL
INTRAVENOUS | Status: DC | PRN
  Administered 2023-07-09: 100 ug/kg/min via INTRAVENOUS

## 2023-07-09 MED ORDER — DEXTROSE 50 % IV SOLN
INTRAVENOUS | Status: AC
  Administered 2023-07-09: 50 mL
  Filled 2023-07-09: qty 50

## 2023-07-09 MED ORDER — FLEET ENEMA RE ENEM
1.0000 | ENEMA | Freq: Once | RECTAL | Status: AC
  Administered 2023-07-09: 1 via RECTAL
  Filled 2023-07-09: qty 1

## 2023-07-09 MED ORDER — PROPOFOL 10 MG/ML IV BOLUS
INTRAVENOUS | Status: DC | PRN
  Administered 2023-07-09: 30 mg via INTRAVENOUS

## 2023-07-09 MED ORDER — PROPOFOL 500 MG/50ML IV EMUL
INTRAVENOUS | Status: AC
  Filled 2023-07-09: qty 50

## 2023-07-09 MED ORDER — LACTATED RINGERS IV SOLN: INTRAVENOUS | Status: DC

## 2023-07-09 NOTE — Progress Notes (Signed)
Physical Therapy Treatment Patient Details Name: Tricia Harrison MRN: [id number] DOB: [birth year] Today's Date: 07/09/2023   History of Present Illness Patient is a 77 year old female sent from her rehab facility due to decreased oral intake, several episodes of nausea and emesis earlier this week.  She has also been having hematochezia at the SNF. Pt recently s/p reverse R  TSA on 06/17/23 and discharged to SNF.  PMH: DVT,CAD,CKD, HTN, LTKA, DM, PE    PT Comments   Pt admitted with above diagnosis.  Pt currently with functional limitations due to the deficits listed below (see PT Problem List). Pt in bed when PT arrived. Pt underwent abdominal CT 07/08/2023 revealing urinary retention and noted discoloration of urine in foley and a swallowing study this am in addition pt is scheduled for sigmoidoscopy. Pt requires encouragement for participation and demonstrated limited initiation of movement and R UE remains immobilized in sling. Pt required max A x 2 for supine <> sit, max A to roll to L side, mod A to short periods of time with CGA for a total of 3 mins seated EOB with c/o abdominal and back pain. Pt left in bed all needs in place and nurse present. Pt will benefit from acute skilled PT to increase their independence and safety with mobility to allow discharge.      If plan is discharge home, recommend the following: Two people to help with walking and/or transfers;Assistance with cooking/housework;Assist for transportation;Help with stairs or ramp for entrance;A lot of help with bathing/dressing/bathroom   Can travel by private vehicle     No  Equipment Recommendations  None recommended by PT    Recommendations for Other Services       Precautions / Restrictions Precautions Precautions: Fall;Shoulder Type of Shoulder Precautions: no ROM shoulder, OK for hand wrist and elbow ROM, per phone call on 7/24 with Dr.Landau patient is ok to use RUE on rollator for transfers and functional  mobility. Shoulder Interventions: Shoulder sling/immobilizer;At all times;Off for dressing/bathing/exercises Precaution Booklet Issued: Yes (comment) Precaution Comments: no, flexion/ABD right shoulder, may place hand on rollator and bear weight to RUE to transfer (all per previous admission about 2 weeks ago) Required Braces or Orthoses: Sling Restrictions Weight Bearing Restrictions: Yes RUE Weight Bearing: Non weight bearing Other Position/Activity Restrictions: OK to WB on RW for transfers     Mobility  Bed Mobility Overal bed mobility: Needs Assistance Bed Mobility: Rolling, Supine to Sit, Sit to Supine (Rolling to the left) Rolling: Max assist   Supine to sit: Max assist, +2 for physical assistance, Used rails Sit to supine: Total assist, +2 for physical assistance   General bed mobility comments: She further required max A for scooting in sitting to EOB and  total assist x2 for scooting to the head of the bed    Transfers Overall transfer level: Needs assistance Equipment used: Rolling walker (2 wheels) Transfers: Sit to/from Stand Sit to Stand: +2 physical assistance, +2 safety/equipment, From elevated surface, Mod assist                Ambulation/Gait               General Gait Details: NT for pt and staff safety   Stairs             Wheelchair Mobility     Tilt Bed    Modified Rankin (Stroke Patients Only)       Balance Overall balance assessment: Needs assistance, History of Falls  Sitting-balance support: Feet supported, Single extremity supported Sitting balance-Leahy Scale: Poor Sitting balance - Comments: able to maintain midline briefly with B LE and L UE support however quickly required mod assist/support with fatigue and posterior lean, pt limited on sitting tolerance 3 min due to reports of abdominal pain 10/10 and back pain Postural control: Right lateral lean                                  Cognition Arousal:  Alert Behavior During Therapy: Flat affect Overall Cognitive Status: Difficult to assess Area of Impairment: Following commands, Attention                     Memory: Decreased short-term memory   Safety/Judgement: Decreased awareness of deficits, Decreased awareness of safety              Exercises      General Comments        Pertinent Vitals/Pain Pain Assessment Pain Assessment: 0-10 Pain Score: 10-Worst pain ever Pain Descriptors / Indicators: Discomfort Pain Intervention(s): Limited activity within patient's tolerance, Monitored during session, Repositioned    Home Living                          Prior Function            PT Goals (current goals can now be found in the care plan section) Acute Rehab PT Goals Patient Stated Goal: to return home PT Goal Formulation: With patient/family Time For Goal Achievement: 07/19/23 Potential to Achieve Goals: Fair Progress towards PT goals: Progressing toward goals (slow)    Frequency    Min 1X/week      PT Plan Current plan remains appropriate    Co-evaluation              AM-PAC PT "6 Clicks" Mobility   Outcome Measure  Help needed turning from your back to your side while in a flat bed without using bedrails?: Total Help needed moving from lying on your back to sitting on the side of a flat bed without using bedrails?: Total Help needed moving to and from a bed to a chair (including a wheelchair)?: Total Help needed standing up from a chair using your arms (e.g., wheelchair or bedside chair)?: Total Help needed to walk in hospital room?: Total Help needed climbing 3-5 steps with a railing? : Total 6 Click Score: 6    End of Session   Activity Tolerance: Patient limited by pain;Patient limited by fatigue Patient left: in bed;with call bell/phone within reach;with bed alarm set;with nursing/sitter in room Nurse Communication: Need for lift equipment;Mobility status;Weight bearing  status PT Visit Diagnosis: Muscle weakness (generalized) (M62.81);Difficulty in walking, not elsewhere classified (R26.2)     Time: 1914-7829 PT Time Calculation (min) (ACUTE ONLY): 24 min  Charges:    $Therapeutic Activity: 8-22 mins $Neuromuscular Re-education: 8-22 mins PT General Charges $$ ACUTE PT VISIT: 1 Visit                     Johnny Bridge, PT Acute Rehab   Jacqualyn Posey 07/09/2023, 12:36 PM

## 2023-07-09 NOTE — Transfer of Care (Signed)
Immediate Anesthesia Transfer of Care Note  Patient: Tricia Harrison  Procedure(s) Performed: FLEXIBLE SIGMOIDOSCOPY  Patient Location: PACU  Anesthesia Type:MAC  Level of Consciousness: sedated  Airway & Oxygen Therapy: Patient Spontanous Breathing  Post-op Assessment: Report given to RN and Post -op Vital signs reviewed and stable  Post vital signs: Reviewed and stable  Last Vitals:  Vitals Value Taken Time  BP    Temp    Pulse 52 07/09/23 1410  Resp 17 07/09/23 1410  SpO2 99 % 07/09/23 1410  Vitals shown include unfiled device data.  Last Pain:  Vitals:   07/09/23 1303  TempSrc: Temporal  PainSc: 0-No pain      Patients Stated Pain Goal: 0 (07/07/23 2127)  Complications: No notable events documented.

## 2023-07-09 NOTE — Anesthesia Postprocedure Evaluation (Signed)
Anesthesia Post Note  Patient: Tricia Harrison  Procedure(s) Performed: FLEXIBLE SIGMOIDOSCOPY     Patient location during evaluation: PACU Anesthesia Type: MAC Level of consciousness: awake and alert Pain management: pain level controlled Vital Signs Assessment: post-procedure vital signs reviewed and stable Respiratory status: spontaneous breathing, nonlabored ventilation, respiratory function stable and patient connected to nasal cannula oxygen Cardiovascular status: stable and blood pressure returned to baseline Postop Assessment: no apparent nausea or vomiting Anesthetic complications: no  No notable events documented.  Last Vitals:  Vitals:   07/09/23 1410 07/09/23 1419  BP: 133/60   Pulse: 66 66  Resp: 17 18  Temp: 36.6 C   SpO2: 97% 96%    Last Pain:  Vitals:   07/09/23 1429  TempSrc:   PainSc: 0-No pain                 Shelton Silvas

## 2023-07-09 NOTE — Progress Notes (Signed)
Modified Barium Swallow Study  Patient Details  Name: Tricia Harrison MRN: [id number] Date of Birth: 08/05/1946  Today's Date: 07/09/2023  Modified Barium Swallow completed.  Full report located under Chart Review in the Imaging Section.  History of Present Illness 77 year old with history of chronic anemia, chronic back pain, chronic constipation, CKD stage IIIb, hypertension and hyperlipidemia, hypothyroidism, dementia, history of pulmonary embolism recently underwent right shoulder replacement, significant postoperative delirium and confusion and was ultimately sent to skilled nursing rehab.  Patient was sent back from the facility with decreased oral intake, nausea and emesis as well as diarrhea along with stool mixed with blood after using laxatives.   Chest x-ray low lung volumes without evidence of acute cardiovascular disease. BSE with possible velopharyngeal dysfunction with hypernasality and complaints of nasal regugitation. MBS 01/14/22 without penetration or aspiration but possible decreased UES opening with barium pooled above UES. Continue Dys 3/thin.   Clinical Impression Pt demonstrates mild oropharyngeal dysphagia without laryngeal penetration or aspiration. Impairments include decreased velopharyngeal function, pharyngeal stripping wave and tongue base retraction. Contact between her velum and pharyngeal wall is decreased allowing mild amount of barium to enter this space which did not advance further. There was residue in the vallecular space and pharyngeal wall increasing with viscosity and she swallows 2-4 times in attempts to clear which she eventually reduces to minimal. Orally she had more difficulty transiting the thicker liquids and possibly due to decreased pressure needed to generate transfer. She was able to transit nectar however honey thick she was unable and had to expectorate majority of the bolus. Mastication with solid was slower and deliberate but she was able to  break up the solid bolus and initiate a swallow with moderate residue. Subsequent swallows sufficiently decreased stasis. Her PES distention may have been slightly reduced but there was no significant residue seen above this space. Esophageal scan was somewhat limited due to view however no significant findings present. Pt does not wish to have liquid diet and prefers to stay on solids with chopped meats, Dys 3. Continue thin liquids, multiple swallows and alternate liquids and solids. Pt and RN states she consumes pills with liquid without difficulty (not given during study). ST will continue to follow. Factors that may increase risk of adverse event in presence of aspiration Rubye Oaks & Clearance Coots 2021):    Swallow Evaluation Recommendations Recommendations: PO diet PO Diet Recommendation: Dysphagia 3 (Mechanical soft);Thin liquids (Level 0) Liquid Administration via: Straw;Cup Medication Administration: Whole meds with liquid Supervision: Patient able to self-feed;Intermittent supervision/cueing for swallowing strategies Swallowing strategies  : Slow rate;Small bites/sips;Multiple dry swallows after each bite/sip;Follow solids with liquids Postural changes: Position pt fully upright for meals Oral care recommendations: Oral care BID (2x/day)      Royce Macadamia 07/09/2023,10:36 AM

## 2023-07-09 NOTE — Interval H&P Note (Signed)
History and Physical Interval Note:  07/09/2023 1:38 PM  Tricia Harrison  has presented today for surgery, with the diagnosis of Rectal bleeding; Abnormal CT scan.  The various methods of treatment have been discussed with the patient and family. After consideration of risks, benefits and other options for treatment, the patient has consented to  Procedure(s): FLEXIBLE SIGMOIDOSCOPY (N/A) as a surgical intervention.  The patient's history has been reviewed, patient examined, no change in status, stable for surgery.  I have reviewed the patient's chart and labs.  Questions were answered to the patient's satisfaction.     Shirley Friar

## 2023-07-09 NOTE — Anesthesia Preprocedure Evaluation (Addendum)
Anesthesia Evaluation  Patient identified by MRN, date of birth, ID band Patient awake    Reviewed: Allergy & Precautions, NPO status , Patient's Chart, lab work & pertinent test results, reviewed documented beta blocker date and time   History of Anesthesia Complications (+) PONV and history of anesthetic complications  Airway Mallampati: III  TM Distance: >3 FB Neck ROM: Full    Dental  (+) Dental Advisory Given, Missing   Pulmonary asthma , sleep apnea , Patient abstained from smoking., former smoker   Pulmonary exam normal breath sounds clear to auscultation       Cardiovascular hypertension, Pt. on home beta blockers and Pt. on medications + Peripheral Vascular Disease and + DOE  Normal cardiovascular exam Rhythm:Regular Rate:Normal     Neuro/Psych  Neuromuscular disease    GI/Hepatic Neg liver ROS, hiatal hernia,GERD  Medicated,,Rectal bleeding; Abnormal CT scan   Endo/Other  diabetesHypothyroidism    Renal/GU Renal InsufficiencyRenal disease     Musculoskeletal  (+) Arthritis ,    Abdominal   Peds  Hematology  (+) Blood dyscrasia (Eliquis), anemia   Anesthesia Other Findings   Reproductive/Obstetrics                             Anesthesia Physical Anesthesia Plan  ASA: 3  Anesthesia Plan: MAC   Post-op Pain Management: Minimal or no pain anticipated   Induction: Intravenous  PONV Risk Score and Plan: 3 and TIVA and Treatment may vary due to age or medical condition  Airway Management Planned: Simple Face Mask and Natural Airway  Additional Equipment:   Intra-op Plan:   Post-operative Plan:   Informed Consent: I have reviewed the patients History and Physical, chart, labs and discussed the procedure including the risks, benefits and alternatives for the proposed anesthesia with the patient or authorized representative who has indicated his/her understanding and  acceptance.     Dental advisory given  Plan Discussed with: CRNA  Anesthesia Plan Comments:        Anesthesia Quick Evaluation

## 2023-07-09 NOTE — TOC Progression Note (Signed)
Transition of Care [facility]) - Progression Note    Patient Details  Name: Tricia Harrison MRN: [id number] Date of Birth: 10-19-1946  Transition [facility]) CM/SW Contact  Beckie Busing, RN Phone [phone]  07/09/2023, 12:27 PM  Clinical Narrative:    CM unable to initiate auth. in [employer]. Administrator made aware. CM requested administrator initiate insurance auth to prevent delays.    Expected Discharge Plan: Skilled Nursing Facility Barriers to Discharge: Continued Medical Work up  Expected Discharge Plan and Services In-house Referral: NA Discharge Planning Services: CM Consult Post Acute Care Choice: Skilled Nursing Facility Living arrangements for the past 2 months: Single Family Home                 DME Arranged: N/A         HH Arranged: NA HH Agency: NA         Social Determinants of Health (SDOH) Interventions SDOH Screenings   Food Insecurity: No Food Insecurity (07/05/2023)  Housing: Low Risk  (07/05/2023)  Transportation Needs: No Transportation Needs (07/05/2023)  Utilities: Not At Risk (07/05/2023)  Depression (PHQ2-9): Low Risk  (11/09/2018)  Tobacco Use: Medium Risk (07/05/2023)    Readmission Risk Interventions    07/08/2023    4:14 PM  Readmission Risk Prevention Plan  Transportation Screening Complete  PCP or Specialist Appt within 5-7 Days Complete  Home Care Screening Complete  Medication Review (RN CM) Complete

## 2023-07-09 NOTE — Brief Op Note (Signed)
Flexible sigmoidscopy: No rectal mass. No rectal inflammation. Internal and external hemorrhoids noted; Sigmoid diverticulosis. No plans for further GI w/u. Diet per speech path. Updated family by phone. F/U with Dr. Ewing Schlein as needed. Will sign off. Call if questions.

## 2023-07-09 NOTE — Progress Notes (Signed)
PROGRESS NOTE    Tricia Harrison  ZOX:[id number] DOB: 02-17-1946 DOA: 07/04/2023 PCP: Eloisa Northern, MD    Brief Narrative:  77 year old with history of chronic anemia, chronic back pain, chronic constipation, CKD stage IIIb, hypertension and hyperlipidemia, hypothyroidism, dementia, history of pulmonary embolism currently on apixaban who recently underwent right shoulder replacement, significant postoperative delirium and confusion and was ultimately sent to skilled nursing rehab.  Patient was sent back from the facility with decreased oral intake, nausea and emesis as well as diarrhea along with stool mixed with blood after using laxatives.  She was also more confused than usual.  In the emergency room hemoglobin 8.9 at about baseline.  Creatinine 2.7 with baseline creatinine of 1.5-1.6.  Chest x-ray low lung volumes without evidence of acute cardiovascular disease.  99% on room air.  Found significantly dehydrated, complaint of diarrhea.  Admitted due to significant findings. Recurrent urinary retention, Foley catheter placed 8/13 with 1300 mL urine output followed by hemorrhagic urine. CT scan with bilateral hydronephrosis due to urinary retention, rectum full of stool.  For sigmoidoscopy today.   Assessment & Plan:   Acute kidney injury with chronic kidney disease stage IIIb: Baseline creatinine 1.5-1.6. Prerenal with poor appetite, poor oral intake.  Valsartan use. Found to have significant urine retention with hydronephrosis, Foley catheter with 1300 mL retention. Continue maintenance IV fluids.  Continue Foley catheter until adequate mobility. Encourage oral dietary and fluid intake.  Hematuria: Suspect secondary to decompression of bladder in a patient who is taking Eliquis.  Continues to have light hematuria.  Monitor hemoglobin. Manual irrigation to prevent clotting.  If recurrent clotting, will start patient on CBI and consult urology. Hold Eliquis as above.  Acute UTI,  hemorrhagic cystitis, urinary retention: Urine culture with multiple species.  Will continue Rocephin until clinical improvement.  Chronic constipation now with diarrhea: With evidence of stool in the rectal vault.  For flexible sigmoidoscopy today.  Rectal bleeding has stopped.C. difficile and GI pathogen panel is negative.  Acute on chronic blood loss anemia. Known baseline hemoglobin of 8-10.  Presented with hemoglobin of 8.9 with.  After rehydration her hemoglobin is 8.5-8. Continue to monitor levels.  Type 2 diabetes, diet controlled.  On carb modified diet. Essential hypertension, dehydrated.  Holding antihypertensives. Hypothyroidism, Synthroid.  Continue. GERD, on Prilosec.  History of pulmonary embolism on Eliquis: Holding Eliquis due to persistent bleeding.  Recent right shoulder surgery, difficult recovery.  Start working with PT OT.  Anticipate back to SNF.  Continue to mobilize.  Speech following.    DVT prophylaxis: SCDs Start: 07/04/23 1731   Code Status: Full code Family Communication: Patient's son and daughter-in-law on the phone. Disposition Plan: Status is: Inpatient.  IV fluids and IV antibiotics.  Inpatient procedures.     Consultants:  GI  Procedures:  Flex sig  Antimicrobials:  Rocephin 8/11---   Subjective:  Patient seen and examined.  Patient herself denies any complaints.  Poor historian.  Does not like edema.  Overnight events noted.  She started noticing blood in her catheter. Catheter was not free-flowing, manual irrigation done at the bedside with evacuation of small clots and able to establish free flow of urine.  Patient denies any pain.  Family on the phone.  Objective: Vitals:   07/08/23 1353 07/08/23 2223 07/09/23 0446 07/09/23 1303  BP: (!) 158/93 128/84 (!) 140/72 (!) 155/78  Pulse: 94 (!) 55 65 98  Resp: 18 16 17 14   Temp: 99.3 F (37.4 C) 99.2 F (37.3  C) 98.3 F (36.8 C) 97.6 F (36.4 C)  TempSrc: Oral Oral Oral Temporal   SpO2: 99% 100% 100% 98%  Weight:      Height:        Intake/Output Summary (Last 24 hours) at 07/09/2023 1354 Last data filed at 07/09/2023 0500 Gross per 24 hour  Intake 4187.63 ml  Output 1750 ml  Net 2437.63 ml   Filed Weights   07/07/23 1009  Weight: 79 kg    Examination:  General: Chronically sick looking.  Alert awake and oriented.  Flat affect.  Poor historian.  Without any distress. Cardiovascular: S1-S2 normal.  Regular rate rhythm. Respiratory: Bilateral clear.  No added sounds. Gastrointestinal: Soft.  Mild tenderness along the suprapubic area.  Bowel sound present. Ext: No swelling or edema. Neuro: Intact.  Slow to respond.     Data Reviewed: I have personally reviewed following labs and imaging studies  CBC: Recent Labs  Lab 07/04/23 1511 07/04/23 2116 07/05/23 0842 07/06/23 0731 07/07/23 0629 07/08/23 0740  WBC 9.6  --  7.5 9.3 11.1* 10.4  NEUTROABS 6.0  --   --  6.4 7.0 7.2  HGB 8.9* 10.1* 8.5* 8.4* 8.3* 8.0*  HCT 27.8* 31.2* 25.9* 26.2* 25.5* 26.4*  MCV 95.2  --  94.9 94.9 94.8 98.9  PLT 539*  --  446* 482* 432* 379   Basic Metabolic Panel: Recent Labs  Lab 07/04/23 1511 07/05/23 0842 07/06/23 0731 07/07/23 0629 07/08/23 0740  NA 133* 135 138 136 136  K 4.3 3.7 3.6 3.7 3.7  CL 106 107 112* 113* 112*  CO2 18* 16* 16* 15* 15*  GLUCOSE 135* 87 71 97 83  BUN 77* 68* 61* 57* 56*  CREATININE 2.70* 2.18* 1.92* 1.93* 1.98*  CALCIUM 8.1* 8.2* 7.9* 7.7* 7.9*  MG  --   --   --  1.8  --    GFR: Estimated Creatinine Clearance: 22.7 mL/min (A) (by C-G formula based on SCr of 1.98 mg/dL (H)). Liver Function Tests: Recent Labs  Lab 07/04/23 1511 07/05/23 0842  AST 35 27  ALT 15 15  ALKPHOS 47 40  BILITOT 0.5 0.6  PROT 5.9* 5.5*  ALBUMIN 2.3* 2.4*   No results for input(s): "LIPASE", "AMYLASE" in the last 168 hours. No results for input(s): "AMMONIA" in the last 168 hours. Coagulation Profile: No results for input(s): "INR", "PROTIME"  in the last 168 hours. Cardiac Enzymes: Recent Labs  Lab 07/04/23 2116  CKTOTAL 110   BNP (last 3 results) No results for input(s): "PROBNP" in the last 8760 hours. HbA1C: No results for input(s): "HGBA1C" in the last 72 hours. CBG: Recent Labs  Lab 07/08/23 1647 07/08/23 2219 07/09/23 0754 07/09/23 1218 07/09/23 1319  GLUCAP 80 81 68* 67* 97   Lipid Profile: No results for input(s): "CHOL", "HDL", "LDLCALC", "TRIG", "CHOLHDL", "LDLDIRECT" in the last 72 hours. Thyroid Function Tests: No results for input(s): "TSH", "T4TOTAL", "FREET4", "T3FREE", "THYROIDAB" in the last 72 hours. Anemia Panel: No results for input(s): "VITAMINB12", "FOLATE", "FERRITIN", "TIBC", "IRON", "RETICCTPCT" in the last 72 hours. Sepsis Labs: No results for input(s): "PROCALCITON", "LATICACIDVEN" in the last 168 hours.  Recent Results (from the past 240 hour(s))  C Difficile Quick Screen w PCR reflex     Status: None   Collection Time: 07/05/23 12:30 PM   Specimen: Rectum; Stool  Result Value Ref Range Status   C Diff antigen NEGATIVE NEGATIVE Final   C Diff toxin NEGATIVE NEGATIVE Final   C Diff interpretation  No C. difficile detected.  Final    Comment: Performed at [facility], [address], [place], [place] [address]  Gastrointestinal Panel by PCR , Stool     Status: None   Collection Time: 07/05/23 12:30 PM   Specimen: Rectum; Stool  Result Value Ref Range Status   Campylobacter species NOT DETECTED NOT DETECTED Final   Plesimonas shigelloides NOT DETECTED NOT DETECTED Final   Salmonella species NOT DETECTED NOT DETECTED Final   Yersinia enterocolitica NOT DETECTED NOT DETECTED Final   Vibrio species NOT DETECTED NOT DETECTED Final   Vibrio cholerae NOT DETECTED NOT DETECTED Final   Enteroaggregative E coli (EAEC) NOT DETECTED NOT DETECTED Final   Enteropathogenic E coli (EPEC) NOT DETECTED NOT DETECTED Final   Enterotoxigenic E coli (ETEC) NOT DETECTED NOT DETECTED  Final   Shiga like toxin producing E coli (STEC) NOT DETECTED NOT DETECTED Final   Shigella/Enteroinvasive E coli (EIEC) NOT DETECTED NOT DETECTED Final   Cryptosporidium NOT DETECTED NOT DETECTED Final   Cyclospora cayetanensis NOT DETECTED NOT DETECTED Final   Entamoeba histolytica NOT DETECTED NOT DETECTED Final   Giardia lamblia NOT DETECTED NOT DETECTED Final   Adenovirus F40/41 NOT DETECTED NOT DETECTED Final   Astrovirus NOT DETECTED NOT DETECTED Final   Norovirus GI/GII NOT DETECTED NOT DETECTED Final   Rotavirus A NOT DETECTED NOT DETECTED Final   Sapovirus (I, II, IV, and V) NOT DETECTED NOT DETECTED Final    Comment: Performed at [facility], [address] [place], [place] [address]  Culture, [place] Urine     Status: Abnormal   Collection Time: 07/05/23  3:00 PM   Specimen: Urine, Catheterized  Result Value Ref Range Status   Specimen Description   Final    URINE, CATHETERIZED Performed at [facility], [address], [place], [place] [address]    Special Requests   Final    URINE, RANDOM Performed at [facility], [address], [place], [place] [address]    Culture (A)  Final    MULTIPLE SPECIES PRESENT, SUGGEST RECOLLECTION NO GROUP B STREP (S.AGALACTIAE) ISOLATED Performed at [facility], [address], [place], [place] [address]    Report Status 07/07/2023 FINAL  Final  Urine Culture (for pregnant, neutropenic or urologic patients or patients with an indwelling urinary catheter)     Status: Abnormal   Collection Time: 07/05/23  3:00 PM   Specimen: Urine, Clean Catch  Result Value Ref Range Status   Specimen Description   Final    URINE, CLEAN CATCH Performed at [facility], [address] [place], [place] [address]    Special Requests   Final    NONE Performed at [facility], [address] [place], [place] [address]    Culture MULTIPLE SPECIES PRESENT, SUGGEST  RECOLLECTION (A)  Final   Report Status 07/07/2023 FINAL  Final         Radiology Studies: DG Swallowing Func-Speech Pathology  Result Date: 07/09/2023 Table formatting from the original result was not included. Modified Barium Swallow Study Patient Details Name: Shylo Shanes MRN: [id number] Date of Birth: [birth year] Today's Date: 07/09/2023 HPI/PMH: HPI: 77 year old with history of chronic anemia, chronic back pain, chronic constipation, CKD stage IIIb, hypertension and hyperlipidemia, hypothyroidism, dementia, history of pulmonary embolism recently underwent right shoulder replacement, significant postoperative delirium and confusion and was ultimately sent to skilled nursing rehab.  Patient was sent back from the facility  with decreased oral intake, nausea and emesis as well as diarrhea along with stool mixed with blood after using laxatives.   Chest x-ray low lung volumes without evidence of acute cardiovascular disease. BSE with possible velopharyngeal dysfunction with hypernasality and complaints of nasal regugitation. MBS 01/14/22 without penetration or aspiration but possible decreased UES opening with barium pooled above UES. Continue Dys 3/thin. Clinical Impression: Clinical Impression: Pt demonstrates mild oropharyngeal dysphagia without laryngeal penetration or aspiration. Impairments include decreased velopharyngeal function, pharyngeal stripping wave and tongue base retraction. Contact between her velum and pharyngeal wall is decreased allowing mild amount of barium to enter this space which did not advance further. There was residue in the vallecular space and pharyngeal wall increasing with viscosity and she swallows 2-4 times in attempts to clear which she eventually reduces to minimal. Orally she had more difficulty transiting the thicker liquids and possibly due to decreased pressure needed to generate transfer. She was able to transit nectar however honey thick she was unable and  had to expectorate majority of the bolus. Mastication with solid was slower and deliberate but she was able to break up the solid bolus and initiate a swallow with moderate residue. Subsequent swallows sufficiently decreased stasis. Her PES distention may have been slightly reduced but there was no significant residue seen above this space. Esophageal scan was somewhat limited due to view however no significant findings present. Pt does not wish to have liquid diet and prefers to stay on solids with chopped meats, Dys 3. Continue thin liquids, multiple swallows and alternate liquids and solids. Pt and RN states she consumes pills with liquid without difficulty (not given during study). ST will continue to follow. Factors that may increase risk of adverse event in presence of aspiration Rubye Oaks [employer] Clearance Coots 2021): No data recorded Recommendations/Plan: Swallowing Evaluation Recommendations Swallowing Evaluation Recommendations Recommendations: PO diet PO Diet Recommendation: Dysphagia 3 (Mechanical soft); Thin liquids (Level 0) Liquid Administration via: Straw; Cup Medication Administration: Whole meds with liquid Supervision: Patient able to self-feed; Intermittent supervision/cueing for swallowing strategies Swallowing strategies  : Slow rate; Small bites/sips; Multiple dry swallows after each bite/sip; Follow solids with liquids Postural changes: Position pt fully upright for meals Oral care recommendations: Oral care BID (2x/day) Treatment Plan Treatment Plan Treatment recommendations: Therapy as outlined in treatment plan below Follow-up recommendations: -- (TBD) Functional status assessment: Patient has had a recent decline in their functional status and demonstrates the ability to make significant improvements in function in a reasonable and predictable amount of time. Treatment frequency: Min 2x/week Treatment duration: 2 weeks Interventions: Aspiration precaution training; Patient/family education; Diet  toleration management by SLP Recommendations Recommendations for follow up therapy are one component of a multi-disciplinary discharge planning process, led by the attending physician.  Recommendations may be updated based on patient status, additional functional criteria and insurance authorization. Assessment: Orofacial Exam: Orofacial Exam Oral Cavity: Oral Hygiene: WFL Oral Cavity - Dentition: Adequate natural dentition Orofacial Anatomy: WFL Oral Motor/Sensory Function: WFL Anatomy: Anatomy: WFL Boluses Administered: Boluses Administered Boluses Administered: Thin liquids (Level 0); Mildly thick liquids (Level 2, nectar thick); Moderately thick liquids (Level 3, honey thick); Puree; Solid  Oral Impairment Domain: Oral Impairment Domain Lip Closure: No labial escape Tongue control during bolus hold: Cohesive bolus between tongue to palatal seal Bolus preparation/mastication: Slow prolonged chewing/mashing with complete recollection Bolus transport/lingual motion: Delayed initiation of tongue motion (oral holding) Oral residue: Majority of bolus remaining; Minimal to no clearance (with honey) Location of oral residue : Tongue  Initiation of pharyngeal swallow : Valleculae  Pharyngeal Impairment Domain: Pharyngeal Impairment Domain Soft palate elevation: Trace column of contrast or air between SP and PW (slight- tip) Laryngeal elevation: Complete superior movement of thyroid cartilage with complete approximation of arytenoids to epiglottic petiole Anterior hyoid excursion: Complete anterior movement Epiglottic movement: Complete inversion Laryngeal vestibule closure: Complete, no air/contrast in laryngeal vestibule Pharyngeal stripping wave : Present - diminished Pharyngoesophageal segment opening: Partial distention/partial duration, partial obstruction of flow Tongue base retraction: Trace column of contrast or air between tongue base and PPW Pharyngeal residue: Collection of residue within or on pharyngeal  structures Location of pharyngeal residue: Valleculae  Esophageal Impairment Domain: Esophageal Impairment Domain Esophageal clearance upright position: Complete clearance, esophageal coating Pill: No data recorded Penetration/Aspiration Scale Score: Penetration/Aspiration Scale Score 1.  Material does not enter airway: Thin liquids (Level 0); Mildly thick liquids (Level 2, nectar thick); Moderately thick liquids (Level 3, honey thick); Puree; Solid Compensatory Strategies: Compensatory Strategies Compensatory strategies: No   General Information: Caregiver present: No  Diet Prior to this Study: Regular; Thin liquids (Level 0)   Temperature : Normal   Respiratory Status: WFL   Supplemental O2: None (Room air)   History of Recent Intubation: No  Behavior/Cognition: Alert; Cooperative; Pleasant mood Self-Feeding Abilities: Needs assist with self-feeding Baseline vocal quality/speech: Abnormal resonance (hypernasal) Volitional Cough: Able to elicit Volitional Swallow: Able to elicit Exam Limitations: No limitations Goal Planning: Prognosis for improved oropharyngeal function: Good No data recorded No data recorded No data recorded Consulted and agree with results and recommendations: Patient; Nurse Pain: Pain Assessment Pain Assessment: Faces Faces Pain Scale: 2 Pain Location: -- (sitting up in chair) Pain Descriptors / Indicators: Discomfort Pain Intervention(s): Monitored during session End of Session: Start Time:SLP Start Time (ACUTE ONLY): 0921 Stop Time: SLP Stop Time (ACUTE ONLY): 1914 Time Calculation:SLP Time Calculation (min) (ACUTE ONLY): 17 min Charges: SLP Evaluations $ SLP Speech Visit: 1 Visit SLP Evaluations $BSS Swallow: 1 Procedure $MBS Swallow: 1 Procedure SLP visit diagnosis: SLP Visit Diagnosis: Dysphagia, oropharyngeal phase (R13.12) Past Medical History: Past Medical History: Diagnosis Date  Anemia   as a child  Anginal pain (HCC)   Asthma   as a child  Bladder incontinence   Chronic back pain    spinal stenosis and buldging disc;scoliosis  Chronic constipation   occasionally takes something OTC  Chronic kidney disease   Diabetes mellitus without complication (HCC)   per pt "Pre" for 20+yrs  Diverticulosis   DVT (deep venous thrombosis) (HCC)   Dyspnea   occasional  Gallstones   has known about this for 3-42yrs  GERD (gastroesophageal reflux disease)   takes Omeprazole daily  Glaucoma   H/O hiatal hernia   Heart murmur   Hemorrhoids   History of blood transfusion   no abnormal reaction noted  History of bronchitis   states its been a long time ago  History of gout   HLD (hyperlipidemia)   takes Fenofibrate daily  HTN (hypertension)   takes Diltiazem and Ramipril daily  Hypothyroidism   takes Synthroid daily  Left knee DJD   Macular degeneration   Nocturia   Palpitations   started in 2013 and only occasionally;last time noticed about 2-3wks ago and after drinking caffeine  Peripheral vascular disease (HCC)   PONV (postoperative nausea and vomiting)   problems swallowing after anethesia- 2/2023and slurred speech also  Pulmonary embolism with acute cor pulmonale (HCC) 07/2018  Submassive Bilateral PE - had Pulm HTN & + Troponin --  PA pressures now back to normal on Echo 10/2018.  Sleep apnea   does not uses cpap Past Surgical History: Past Surgical History: Procedure Laterality Date  3-day EVENT MONITOR  01/2019  Mostly normal monitor.  Predominantly sinus rhythm (rate range 48-105 bpm, average 67 bpm) 9 short atrial runs (fastest = 13 beats rate 135 bpm, longest ~17 seconds-101 bpm) -> not noted on symptom log.  No sustained arrhythmias (tachycardia or bradycardia), or pauses..  Symptoms are noted with PVCs.  bilateral cataract surgery     CHOLECYSTECTOMY N/A 01/10/2022  Procedure: LAPAROSCOPIC CHOLECYSTECTOMY WITH INTRAOPERATIVE CHOLANGIOGRAM;  Surgeon: Karie Soda, MD;  Location: [facility];  Service: General;  Laterality: N/A;  COLONOSCOPY    CT ANGIOGRAM OF CHEST - PE PROTOCOL  07/2018  Bilateral nonocclusive  pulmonary emboli evidence of right heart strain consistent with "submassive "/intermediate PE.  -->  Code PE called.  DILATION AND CURETTAGE OF UTERUS    multiple about 6-7 times  ESOPHAGOGASTRODUODENOSCOPY    NM VQ LUNG SCAN ([facility] HX)  11/23/2018  Low probability for PE  REVERSE SHOULDER ARTHROPLASTY Right 06/17/2023  Procedure: RIGHT REVERSE SHOULDER ARTHROPLASTY;  Surgeon: Teryl Lucy, MD;  Location: [facility];  Service: Orthopedics;  Laterality: Right;  RIGHT/LEFT HEART CATH AND CORONARY ANGIOGRAPHY N/A 08/19/2018  Procedure: RIGHT/LEFT HEART CATH AND CORONARY ANGIOGRAPHY;  Surgeon: Corky Crafts, MD;  Location: [facility] INVASIVE CV LAB;  Service: Cardiovascular:: Nonobstructive CAD.  Mild pulmonary pretension.  Mean PA pressure 26 mmHg with PA P 48/20 mmHg.  Normal LVEDP.  THYROID SURGERY Right 2010  TOTAL HIP ARTHROPLASTY Left 2005  TOTAL KNEE ARTHROPLASTY Left 02/21/2014  Procedure: TOTAL KNEE ARTHROPLASTY;  Surgeon: Nilda Simmer, MD;  Location: [facility] OR;  Service: Orthopedics;  Laterality: Left;  TRANSTHORACIC ECHOCARDIOGRAM  07/2018   (In setting of acute PE) mild LVH.  EF 55 to 60%.  GR 1 DD.  Aortic sclerosis but no stenosis.  Mild regurgitation.  Mild RA dilation.  Moderate LA dilation.  Moderate tricuspid regurgitation with severe primary hypertension estimated PA pressure 71 mmHg.  RV poorly visualized  TRANSTHORACIC ECHOCARDIOGRAM  10/2018 - 11/2019  a) EF 60-65%.  GR 1 DD.  Focal basal LVH. Mod AI/ no AS. Mild LA dilation.  Normal RV size and fxn.  Mod PI w/ mild TR.  Peak PAP ~ 38 mmHg; b) Normal EF 60 to 65%.  Moderate LVH-GR 1 DD.  R WMA.  Mild AS w/ trivial AI.  Grossly normal PA, RV and RA size.  Normal RV function.  Normal PA pressures.  TRANSTHORACIC ECHOCARDIOGRAM  01/2021   EF 55%.  Normal LV function.  Mild LVH.  GR 1 DD.  Normal PA pressures.  Severe LA dilation.  (Consistent with more significant diastolic dysfunction).  Mild-possibly mild to moderate aortic insufficiency.  Borderline  elevated RAP.  Trivial pericardial effusion.  (Stable)  VAGINAL HYSTERECTOMY  1979 Royce Macadamia 07/09/2023, 10:35 AM   CT ABDOMEN PELVIS WO CONTRAST  Result Date: 07/08/2023 CLINICAL DATA:  Severe left lower quadrant abdominal pain, bloody stools EXAM: CT ABDOMEN AND PELVIS WITHOUT CONTRAST TECHNIQUE: Multidetector CT imaging of the abdomen and pelvis was performed following the standard protocol without IV contrast. RADIATION DOSE REDUCTION: This exam was performed according to the departmental dose-optimization program which includes automated exposure control, adjustment of the mA and/or kV according to patient size and/or use of iterative reconstruction technique. COMPARISON:  01/30/2017 FINDINGS: Lower chest: Mild cardiomegaly. Moderate pericardial effusion. Coronary artery calcifications. Trace bilateral pleural  effusions and associated atelectasis or consolidation. Moderate hiatal hernia with intrathoracic position of the gastric fundus. Hepatobiliary: No focal liver abnormality is seen. Status post cholecystectomy. No biliary dilatation. Pancreas: Unremarkable. No pancreatic ductal dilatation or surrounding inflammatory changes. Spleen: Normal in size without significant abnormality. Adrenals/Urinary Tract: Adrenal glands are unremarkable. Moderate bilateral hydronephrosis and hydroureter to the ureterovesicular junction without calculus or other obstruction. Distended urinary bladder. Stomach/Bowel: Stomach is within normal limits. Appendix appears normal. Soft tissue thickening about the low rectum (series 3, image 78). Cecal, descending and sigmoid diverticulosis. Vascular/Lymphatic: Aortic atherosclerosis. No enlarged abdominal or pelvic lymph nodes. Reproductive: Status post hysterectomy. Other: No abdominal wall hernia.  Anasarca.  No ascites. Musculoskeletal: No acute or significant osseous findings. Status post left hip total arthroplasty. IMPRESSION: 1. Soft tissue thickening about the  low rectum. This is nonspecific; neoplasm not excluded. Recommend direct visualization. 2. Moderate bilateral hydronephrosis and hydroureter to the ureterovesicular junction without calculus or other obstruction. This is presumably secondary to back pressure in the setting of urinary bladder distention. Correlate for urinary retention. 3. Cecal, descending and sigmoid diverticulosis without evidence of acute diverticulitis. 4. Moderate pericardial effusion. 5. Trace bilateral pleural effusions and associated atelectasis or consolidation. 6. Moderate hiatal hernia with intrathoracic position of the gastric fundus. 7. Coronary artery disease. Aortic Atherosclerosis (ICD10-I70.0). Electronically Signed   By: Jearld Lesch M.D.   On: 07/08/2023 14:16        Scheduled Meds:  [MAR Hold] carvedilol  6.25 mg Oral Daily   [MAR Hold] ketorolac  1 drop Both Eyes QID   [MAR Hold] latanoprost  1 drop Both Eyes QHS   [MAR Hold] levothyroxine  50 mcg Oral Q0600   [MAR Hold] pantoprazole  40 mg Oral Daily   [MAR Hold] QUEtiapine  25 mg Oral QHS   [MAR Hold] rosuvastatin  20 mg Oral QPM   Continuous Infusions:  sodium chloride 125 mL/hr at 07/09/23 0500   [MAR Hold] cefTRIAXone (ROCEPHIN)  IV 1 g (07/09/23 1117)   lactated ringers 10 mL/hr at 07/09/23 1345     LOS: 4 days    Time spent: 35 minutes    Dorcas Carrow, MD [facility]

## 2023-07-09 NOTE — Op Note (Signed)
[facility] Patient Name: Tricia Harrison Procedure Date: 07/09/2023 MRN: [id number] Attending MD: Shirley Friar , MD, [id number] Date of Birth: [birth year] CSN: [id number] Age: 77 Admit Type: Inpatient Procedure:                Flexible Sigmoidoscopy Indications:              Rectal hemorrhage, Abnormal CT of the GI tract Providers:                Shirley Friar, MD, Geralyn Corwin, RN,                            Marja Kays, Technician Referring MD:             hospital team Medicines:                Propofol per Anesthesia, Monitored Anesthesia Care Complications:            No immediate complications. Estimated Blood Loss:     Estimated blood loss: none. Procedure:                Pre-Anesthesia Assessment:                           - Prior to the procedure, a History and Physical                            was performed, and patient medications and                            allergies were reviewed. The patient's tolerance of                            previous anesthesia was also reviewed. The risks                            and benefits of the procedure and the sedation                            options and risks were discussed with the patient.                            All questions were answered, and informed consent                            was obtained. Prior Anticoagulants: The patient has                            taken Eliquis (apixaban), last dose was 1 day prior                            to procedure. ASA Grade Assessment: III - A patient                            with severe systemic disease. After reviewing the  risks and benefits, the patient was deemed in                            satisfactory condition to undergo the procedure.                           After obtaining informed consent, the scope was                            passed under direct vision. The PCF-HQ190L                             ([id number] Olympus colonoscope was introduced                            through the anus and advanced to the sigmoid colon.                            The flexible sigmoidoscopy was accomplished without                            difficulty. The patient tolerated the procedure                            well. The quality of the bowel preparation was poor                            but repeated irrigation led to an adequately                            prepped rectum and distal sigmoid colon. Scope In: 1:59:05 PM Scope Out: 2:02:42 PM Total Procedure Duration: 0 hours 3 minutes 37 seconds  Findings:      The perianal exam findings include non-thrombosed external hemorrhoids.      There is no endoscopic evidence of mass in the rectum.      Internal hemorrhoids were found during retroflexion. The hemorrhoids       were medium-sized and Grade I (internal hemorrhoids that do not       prolapse).      A large amount of semi-liquid semi-solid solid stool was found in the       rectum and in the sigmoid colon, making visualization difficult. Lavage       of the area was performed, resulting in clearance with fair       visualization.      Multiple medium-mouthed diverticula were found in the sigmoid colon. Impression:               - Non-thrombosed external hemorrhoids found on                            perianal exam.                           - Internal hemorrhoids.                           -  Stool in the rectum and in the sigmoid colon.                           - Diverticulosis in the sigmoid colon.                           - No specimens collected. Moderate Sedation:      N/A - MAC procedure Recommendation:           - Resume previous diet. Procedure Code(s):        --- Professional ---                           [phone], Sigmoidoscopy, flexible; diagnostic,                            including collection of specimen(s) by brushing or                            washing, when performed  (separate procedure) Diagnosis Code(s):        --- Professional ---                           K62.5, Hemorrhage of anus and rectum                           R93.3, Abnormal findings on diagnostic imaging of                            other parts of digestive tract                           K64.0, First degree hemorrhoids                           K64.4, Residual hemorrhoidal skin tags                           K57.30, Diverticulosis of large intestine without                            perforation or abscess without bleeding CPT copyright 2022 [employer]. All rights reserved. The codes documented in this report are preliminary and upon coder review may  be revised to meet current compliance requirements. Shirley Friar, MD 07/09/2023 2:12:00 PM This report has been signed electronically. Number of Addenda: 0

## 2023-07-09 NOTE — Progress Notes (Signed)
OT Cancellation Note  Patient Details Name: Mylie Sparby MRN: [id number] DOB: 01-17-1946   Cancelled Treatment:    Reason Eval/Treat Not Completed: Patient at procedure or test/ unavailable.    Reuben Likes, OTR/L 07/09/2023, 5:03 PM

## 2023-07-10 DIAGNOSIS — N179 Acute kidney failure, unspecified: Secondary | ICD-10-CM | POA: Diagnosis not present

## 2023-07-10 LAB — CBC WITH DIFFERENTIAL/PLATELET
Abs Immature Granulocytes: 0.06 10*3/uL (ref 0.00–0.07)
Basophils Absolute: 0.1 10*3/uL (ref 0.0–0.1)
Basophils Relative: 1 %
Eosinophils Absolute: 0.2 10*3/uL (ref 0.0–0.5)
Eosinophils Relative: 3 %
HCT: 23.7 % — ABNORMAL LOW (ref 36.0–46.0)
Hemoglobin: 7.3 g/dL — ABNORMAL LOW (ref 12.0–15.0)
Immature Granulocytes: 1 %
Lymphocytes Relative: 19 %
Lymphs Abs: 1.3 10*3/uL (ref 0.7–4.0)
MCH: 30.5 pg (ref 26.0–34.0)
MCHC: 30.8 g/dL (ref 30.0–36.0)
MCV: 99.2 fL (ref 80.0–100.0)
Monocytes Absolute: 0.8 10*3/uL (ref 0.1–1.0)
Monocytes Relative: 11 %
Neutro Abs: 4.6 10*3/uL (ref 1.7–7.7)
Neutrophils Relative %: 65 %
Platelets: 302 10*3/uL (ref 150–400)
RBC: 2.39 MIL/uL — ABNORMAL LOW (ref 3.87–5.11)
RDW: 14.9 % (ref 11.5–15.5)
WBC: 7.1 10*3/uL (ref 4.0–10.5)
nRBC: 0 % (ref 0.0–0.2)

## 2023-07-10 LAB — GLUCOSE, CAPILLARY
Glucose-Capillary: 73 mg/dL (ref 70–99)
Glucose-Capillary: 86 mg/dL (ref 70–99)
Glucose-Capillary: 77 mg/dL (ref 70–99)
Glucose-Capillary: 88 mg/dL (ref 70–99)

## 2023-07-10 LAB — BASIC METABOLIC PANEL
Anion gap: 6 (ref 5–15)
BUN: 40 mg/dL — ABNORMAL HIGH (ref 8–23)
CO2: 14 mmol/L — ABNORMAL LOW (ref 22–32)
Calcium: 7.4 mg/dL — ABNORMAL LOW (ref 8.9–10.3)
Chloride: 116 mmol/L — ABNORMAL HIGH (ref 98–111)
Creatinine, Ser: 1.35 mg/dL — ABNORMAL HIGH (ref 0.44–1.00)
GFR, Estimated: 40 mL/min — ABNORMAL LOW (ref >60)
Glucose, Bld: 78 mg/dL (ref 70–99)
Potassium: 3.5 mmol/L (ref 3.5–5.1)
Sodium: 136 mmol/L (ref 135–145)

## 2023-07-10 LAB — PREPARE RBC (CROSSMATCH)

## 2023-07-10 MED ORDER — CEPHALEXIN 500 MG PO CAPS
500.0000 mg | ORAL_CAPSULE | Freq: Three times a day (TID) | ORAL | Status: AC
  Administered 2023-07-10 – 2023-07-15 (×15): 500 mg via ORAL
  Filled 2023-07-10 (×15): qty 1

## 2023-07-10 MED ORDER — APIXABAN 5 MG PO TABS
5.0000 mg | ORAL_TABLET | Freq: Two times a day (BID) | ORAL | Status: DC
  Administered 2023-07-10 – 2023-07-14 (×8): 5 mg via ORAL
  Filled 2023-07-10 (×8): qty 1

## 2023-07-10 MED ORDER — SODIUM CHLORIDE 0.9% IV SOLUTION: Freq: Once | INTRAVENOUS | Status: AC

## 2023-07-10 NOTE — Progress Notes (Signed)
PROGRESS NOTE    Tricia Harrison  ZOX:[id number] DOB: 09-01-1946 DOA: 07/04/2023 PCP: Eloisa Northern, MD    Brief Narrative:  77 year old with history of chronic anemia, chronic back pain, chronic constipation, CKD stage IIIb, hypertension and hyperlipidemia, hypothyroidism, dementia, history of pulmonary embolism currently on apixaban who recently underwent right shoulder replacement, significant postoperative delirium and confusion and was ultimately sent to skilled nursing rehab.  Patient was sent back from the facility with decreased oral intake, nausea and emesis as well as diarrhea along with stool mixed with blood after using laxatives.  She was also more confused than usual.  In the emergency room hemoglobin 8.9 at about baseline.  Creatinine 2.7 with baseline creatinine of 1.5-1.6.  Chest x-ray low lung volumes without evidence of acute cardiovascular disease.  99% on room air.  Found significantly dehydrated, complaint of diarrhea.  Admitted due to significant findings. Recurrent urinary retention, Foley catheter placed 8/13 with 1300 mL urine output followed by hemorrhagic urine. CT scan with bilateral hydronephrosis due to urinary retention, rectum full of stool.  For sigmoidoscopy with no mass, stool evacuated.   Assessment & Plan:   Acute kidney injury with chronic kidney disease stage IIIb: Baseline creatinine 1.5-1.6.  Presented with creatinine 2.7.  Did not respond to IV fluids, responded to Foley catheter placement.  1.3 today. Prerenal with poor appetite, poor oral intake.  Valsartan use. Found to have significant urine retention with hydronephrosis, Foley catheter with 1300 mL retention. Decrease low-dose IV fluids today.  Continue Foley catheter until adequate mobility. Encourage oral dietary and fluid intake.  Hematuria: Suspect secondary to decompression of bladder in a patient who is taking Eliquis.  Some improvement today. Monitor hemoglobin.  7.3 today. Manual  irrigation to prevent clotting.  If recurrent clotting, will start patient on CBI and consult urology. Resume Eliquis once urine clears.  Acute on chronic anemia, anemia of acute blood loss due to hematuria: Hemoglobin 7.3.  Intermittent hematuria.  Patient will benefit with 1 unit of transfusion.  Consented for PRBC transfusion by patient and her son.  Acute UTI, hemorrhagic cystitis, urinary retention: Urine culture with multiple species.  Will treat as complicated UTI with oral antibiotics for total 10 days.   Chronic constipation now with diarrhea: With evidence of stool in the rectal vault.  Flex sig.  Continue bowel regimen.  Avoid constipation. Rectal bleeding has stopped. C. difficile and GI pathogen panel is negative.  Type 2 diabetes, diet controlled.  On carb modified diet. Essential hypertension, dehydrated.  Holding antihypertensives. Hypothyroidism, Synthroid.  Continue. GERD, on Prilosec.  History of pulmonary embolism on Eliquis: Holding Eliquis due to persistent bleeding.  Recent right shoulder surgery, difficult recovery.  Start working with PT OT.  Anticipate back to SNF.  Continue to mobilize.  Speech following.    DVT prophylaxis: SCDs Start: 07/04/23 1731   Code Status: Full code Family Communication: Patient's son over the phone. Disposition Plan: Status is: Inpatient.  IV fluids and IV antibiotics.  Inpatient procedures.     Consultants:  GI  Procedures:  Flex sig  Antimicrobials:  Rocephin 8/11--- 8/15 Keflex 8/15---   Subjective: Patient seen and examined.  She is not sure how she feels.  No overnight events. Still has mild discomfort lower abdomen.  Foley catheter with dark urine, no frank hematuria today. Consented for blood transfusion.  Son on the phone.   Objective: Vitals:   07/09/23 1410 07/09/23 1419 07/09/23 2041 07/10/23 0456  BP: 133/60  (!) 147/93 136/74  Pulse: 66 66 (!) 102 (!) 52  Resp: 17 18 16 14   Temp: 97.8 F (36.6 C)   99.4 F (37.4 C) 98.5 F (36.9 C)  TempSrc: Temporal  Oral Oral  SpO2: 97% 96% 100% 99%  Weight:      Height:        Intake/Output Summary (Last 24 hours) at 07/10/2023 1258 Last data filed at 07/10/2023 1035 Gross per 24 hour  Intake 700 ml  Output 975 ml  Net -275 ml   Filed Weights   07/07/23 1009  Weight: 79 kg    Examination:  General: Patient looks fairly comfortable today.  Patient is mostly alert awake and oriented.  Chronically sick looking.  Debilitated and mostly laying in the bed. Cardiovascular: S1-S2 normal.  Regular rate rhythm. Respiratory: Bilateral clear.  No added sounds. Gastrointestinal: Soft.  Nontender.  Pendulous.  No rigidity or guarding. Foley catheter with amber-colored dark urine. Ext: No edema.  No cyanosis.   Data Reviewed: I have personally reviewed following labs and imaging studies  CBC: Recent Labs  Lab 07/04/23 1511 07/04/23 2116 07/05/23 0842 07/06/23 0731 07/07/23 0629 07/08/23 0740 07/10/23 0551  WBC 9.6  --  7.5 9.3 11.1* 10.4 7.1  NEUTROABS 6.0  --   --  6.4 7.0 7.2 4.6  HGB 8.9*   < > 8.5* 8.4* 8.3* 8.0* 7.3*  HCT 27.8*   < > 25.9* 26.2* 25.5* 26.4* 23.7*  MCV 95.2  --  94.9 94.9 94.8 98.9 99.2  PLT 539*  --  446* 482* 432* 379 302   < > = values in this interval not displayed.   Basic Metabolic Panel: Recent Labs  Lab 07/05/23 0842 07/06/23 0731 07/07/23 0629 07/08/23 0740 07/10/23 0551  NA 135 138 136 136 136  K 3.7 3.6 3.7 3.7 3.5  CL 107 112* 113* 112* 116*  CO2 16* 16* 15* 15* 14*  GLUCOSE 87 71 97 83 78  BUN 68* 61* 57* 56* 40*  CREATININE 2.18* 1.92* 1.93* 1.98* 1.35*  CALCIUM 8.2* 7.9* 7.7* 7.9* 7.4*  MG  --   --  1.8  --   --    GFR: Estimated Creatinine Clearance: 33.2 mL/min (A) (by C-G formula based on SCr of 1.35 mg/dL (H)). Liver Function Tests: Recent Labs  Lab 07/04/23 1511 07/05/23 0842  AST 35 27  ALT 15 15  ALKPHOS 47 40  BILITOT 0.5 0.6  PROT 5.9* 5.5*  ALBUMIN 2.3* 2.4*   No  results for input(s): "LIPASE", "AMYLASE" in the last 168 hours. No results for input(s): "AMMONIA" in the last 168 hours. Coagulation Profile: No results for input(s): "INR", "PROTIME" in the last 168 hours. Cardiac Enzymes: Recent Labs  Lab 07/04/23 2116  CKTOTAL 110   BNP (last 3 results) No results for input(s): "PROBNP" in the last 8760 hours. HbA1C: No results for input(s): "HGBA1C" in the last 72 hours. CBG: Recent Labs  Lab 07/09/23 1319 07/09/23 1630 07/09/23 2135 07/10/23 0732 07/10/23 1217  GLUCAP 97 78 78 73 86   Lipid Profile: No results for input(s): "CHOL", "HDL", "LDLCALC", "TRIG", "CHOLHDL", "LDLDIRECT" in the last 72 hours. Thyroid Function Tests: No results for input(s): "TSH", "T4TOTAL", "FREET4", "T3FREE", "THYROIDAB" in the last 72 hours. Anemia Panel: No results for input(s): "VITAMINB12", "FOLATE", "FERRITIN", "TIBC", "IRON", "RETICCTPCT" in the last 72 hours. Sepsis Labs: No results for input(s): "PROCALCITON", "LATICACIDVEN" in the last 168 hours.  Recent Results (from the past 240 hour(s))  C Difficile Quick Screen  w PCR reflex     Status: None   Collection Time: 07/05/23 12:30 PM   Specimen: Rectum; Stool  Result Value Ref Range Status   C Diff antigen NEGATIVE NEGATIVE Final   C Diff toxin NEGATIVE NEGATIVE Final   C Diff interpretation No C. difficile detected.  Final    Comment: Performed at [facility], [address] [place], [place] [address]  Gastrointestinal Panel by PCR , Stool     Status: None   Collection Time: 07/05/23 12:30 PM   Specimen: Rectum; Stool  Result Value Ref Range Status   Campylobacter species NOT DETECTED NOT DETECTED Final   Plesimonas shigelloides NOT DETECTED NOT DETECTED Final   Salmonella species NOT DETECTED NOT DETECTED Final   Yersinia enterocolitica NOT DETECTED NOT DETECTED Final   Vibrio species NOT DETECTED NOT DETECTED Final   Vibrio cholerae NOT DETECTED NOT DETECTED Final    Enteroaggregative E coli (EAEC) NOT DETECTED NOT DETECTED Final   Enteropathogenic E coli (EPEC) NOT DETECTED NOT DETECTED Final   Enterotoxigenic E coli (ETEC) NOT DETECTED NOT DETECTED Final   Shiga like toxin producing E coli (STEC) NOT DETECTED NOT DETECTED Final   Shigella/Enteroinvasive E coli (EIEC) NOT DETECTED NOT DETECTED Final   Cryptosporidium NOT DETECTED NOT DETECTED Final   Cyclospora cayetanensis NOT DETECTED NOT DETECTED Final   Entamoeba histolytica NOT DETECTED NOT DETECTED Final   Giardia lamblia NOT DETECTED NOT DETECTED Final   Adenovirus F40/41 NOT DETECTED NOT DETECTED Final   Astrovirus NOT DETECTED NOT DETECTED Final   Norovirus GI/GII NOT DETECTED NOT DETECTED Final   Rotavirus A NOT DETECTED NOT DETECTED Final   Sapovirus (I, II, IV, and V) NOT DETECTED NOT DETECTED Final    Comment: Performed at [facility], [address] [place], [place] [address]  Culture, [place] Urine     Status: Abnormal   Collection Time: 07/05/23  3:00 PM   Specimen: Urine, Catheterized  Result Value Ref Range Status   Specimen Description   Final    URINE, CATHETERIZED Performed at [facility], [address], [place], [place] [address]    Special Requests   Final    URINE, RANDOM Performed at [facility], [address] [place], [place] [address]    Culture (A)  Final    MULTIPLE SPECIES PRESENT, SUGGEST RECOLLECTION NO GROUP B STREP (S.AGALACTIAE) ISOLATED Performed at [facility], [address] [place], [place] [address]    Report Status 07/07/2023 FINAL  Final  Urine Culture (for pregnant, neutropenic or urologic patients or patients with an indwelling urinary catheter)     Status: Abnormal   Collection Time: 07/05/23  3:00 PM   Specimen: Urine, Clean Catch  Result Value Ref Range Status   Specimen Description   Final    URINE, CLEAN CATCH Performed at [facility], [address]  [place], [place] [address]    Special Requests   Final    NONE Performed at [facility], [address], [place], [place] [address]    Culture MULTIPLE SPECIES PRESENT, SUGGEST RECOLLECTION (A)  Final   Report Status 07/07/2023 FINAL  Final         Radiology Studies: DG Swallowing Func-Speech Pathology  Result Date: 07/09/2023 Table formatting from the original result was not included. Modified Barium Swallow Study Patient Details Name: Sanvi Oguinn MRN: [id number] Date of Birth: [birth year] Today's Date: 07/09/2023 HPI/PMH: HPI: 77 year old  with history of chronic anemia, chronic back pain, chronic constipation, CKD stage IIIb, hypertension and hyperlipidemia, hypothyroidism, dementia, history of pulmonary embolism recently underwent right shoulder replacement, significant postoperative delirium and confusion and was ultimately sent to skilled nursing rehab.  Patient was sent back from the facility with decreased oral intake, nausea and emesis as well as diarrhea along with stool mixed with blood after using laxatives.   Chest x-ray low lung volumes without evidence of acute cardiovascular disease. BSE with possible velopharyngeal dysfunction with hypernasality and complaints of nasal regugitation. MBS 01/14/22 without penetration or aspiration but possible decreased UES opening with barium pooled above UES. Continue Dys 3/thin. Clinical Impression: Clinical Impression: Pt demonstrates mild oropharyngeal dysphagia without laryngeal penetration or aspiration. Impairments include decreased velopharyngeal function, pharyngeal stripping wave and tongue base retraction. Contact between her velum and pharyngeal wall is decreased allowing mild amount of barium to enter this space which did not advance further. There was residue in the vallecular space and pharyngeal wall increasing with viscosity and she swallows 2-4 times in attempts to clear which she eventually reduces to minimal.  Orally she had more difficulty transiting the thicker liquids and possibly due to decreased pressure needed to generate transfer. She was able to transit nectar however honey thick she was unable and had to expectorate majority of the bolus. Mastication with solid was slower and deliberate but she was able to break up the solid bolus and initiate a swallow with moderate residue. Subsequent swallows sufficiently decreased stasis. Her PES distention may have been slightly reduced but there was no significant residue seen above this space. Esophageal scan was somewhat limited due to view however no significant findings present. Pt does not wish to have liquid diet and prefers to stay on solids with chopped meats, Dys 3. Continue thin liquids, multiple swallows and alternate liquids and solids. Pt and RN states she consumes pills with liquid without difficulty (not given during study). ST will continue to follow. Factors that may increase risk of adverse event in presence of aspiration Rubye Oaks & Clearance Coots 2021): No data recorded Recommendations/Plan: Swallowing Evaluation Recommendations Swallowing Evaluation Recommendations Recommendations: PO diet PO Diet Recommendation: Dysphagia 3 (Mechanical soft); Thin liquids (Level 0) Liquid Administration via: Straw; Cup Medication Administration: Whole meds with liquid Supervision: Patient able to self-feed; Intermittent supervision/cueing for swallowing strategies Swallowing strategies  : Slow rate; Small bites/sips; Multiple dry swallows after each bite/sip; Follow solids with liquids Postural changes: Position pt fully upright for meals Oral care recommendations: Oral care BID (2x/day) Treatment Plan Treatment Plan Treatment recommendations: Therapy as outlined in treatment plan below Follow-up recommendations: -- (TBD) Functional status assessment: Patient has had a recent decline in their functional status and demonstrates the ability to make significant improvements in  function in a reasonable and predictable amount of time. Treatment frequency: Min 2x/week Treatment duration: 2 weeks Interventions: Aspiration precaution training; Patient/family education; Diet toleration management by SLP Recommendations Recommendations for follow up therapy are one component of a multi-disciplinary discharge planning process, led by the attending physician.  Recommendations may be updated based on patient status, additional functional criteria and insurance authorization. Assessment: Orofacial Exam: Orofacial Exam Oral Cavity: Oral Hygiene: WFL Oral Cavity - Dentition: Adequate natural dentition Orofacial Anatomy: WFL Oral Motor/Sensory Function: WFL Anatomy: Anatomy: WFL Boluses Administered: Boluses Administered Boluses Administered: Thin liquids (Level 0); Mildly thick liquids (Level 2, nectar thick); Moderately thick liquids (Level 3, honey thick); Puree; Solid  Oral Impairment Domain: Oral Impairment Domain Lip Closure: No labial escape  Tongue control during bolus hold: Cohesive bolus between tongue to palatal seal Bolus preparation/mastication: Slow prolonged chewing/mashing with complete recollection Bolus transport/lingual motion: Delayed initiation of tongue motion (oral holding) Oral residue: Majority of bolus remaining; Minimal to no clearance (with honey) Location of oral residue : Tongue Initiation of pharyngeal swallow : Valleculae  Pharyngeal Impairment Domain: Pharyngeal Impairment Domain Soft palate elevation: Trace column of contrast or air between SP and PW (slight- tip) Laryngeal elevation: Complete superior movement of thyroid cartilage with complete approximation of arytenoids to epiglottic petiole Anterior hyoid excursion: Complete anterior movement Epiglottic movement: Complete inversion Laryngeal vestibule closure: Complete, no air/contrast in laryngeal vestibule Pharyngeal stripping wave : Present - diminished Pharyngoesophageal segment opening: Partial  distention/partial duration, partial obstruction of flow Tongue base retraction: Trace column of contrast or air between tongue base and PPW Pharyngeal residue: Collection of residue within or on pharyngeal structures Location of pharyngeal residue: Valleculae  Esophageal Impairment Domain: Esophageal Impairment Domain Esophageal clearance upright position: Complete clearance, esophageal coating Pill: No data recorded Penetration/Aspiration Scale Score: Penetration/Aspiration Scale Score 1.  Material does not enter airway: Thin liquids (Level 0); Mildly thick liquids (Level 2, nectar thick); Moderately thick liquids (Level 3, honey thick); Puree; Solid Compensatory Strategies: Compensatory Strategies Compensatory strategies: No   General Information: Caregiver present: No  Diet Prior to this Study: Regular; Thin liquids (Level 0)   Temperature : Normal   Respiratory Status: WFL   Supplemental O2: None (Room air)   History of Recent Intubation: No  Behavior/Cognition: Alert; Cooperative; Pleasant mood Self-Feeding Abilities: Needs assist with self-feeding Baseline vocal quality/speech: Abnormal resonance (hypernasal) Volitional Cough: Able to elicit Volitional Swallow: Able to elicit Exam Limitations: No limitations Goal Planning: Prognosis for improved oropharyngeal function: Good No data recorded No data recorded No data recorded Consulted and agree with results and recommendations: Patient; Nurse Pain: Pain Assessment Pain Assessment: Faces Faces Pain Scale: 2 Pain Location: -- (sitting up in chair) Pain Descriptors / Indicators: Discomfort Pain Intervention(s): Monitored during session End of Session: Start Time:SLP Start Time (ACUTE ONLY): 0921 Stop Time: SLP Stop Time (ACUTE ONLY): 1610 Time Calculation:SLP Time Calculation (min) (ACUTE ONLY): 17 min Charges: SLP Evaluations $ SLP Speech Visit: 1 Visit SLP Evaluations $BSS Swallow: 1 Procedure $MBS Swallow: 1 Procedure SLP visit diagnosis: SLP Visit Diagnosis:  Dysphagia, oropharyngeal phase (R13.12) Past Medical History: Past Medical History: Diagnosis Date  Anemia   as a child  Anginal pain (HCC)   Asthma   as a child  Bladder incontinence   Chronic back pain   spinal stenosis and buldging disc;scoliosis  Chronic constipation   occasionally takes something OTC  Chronic kidney disease   Diabetes mellitus without complication (HCC)   per pt "Pre" for 20+yrs  Diverticulosis   DVT (deep venous thrombosis) (HCC)   Dyspnea   occasional  Gallstones   has known about this for 3-54yrs  GERD (gastroesophageal reflux disease)   takes Omeprazole daily  Glaucoma   H/O hiatal hernia   Heart murmur   Hemorrhoids   History of blood transfusion   no abnormal reaction noted  History of bronchitis   states its been a long time ago  History of gout   HLD (hyperlipidemia)   takes Fenofibrate daily  HTN (hypertension)   takes Diltiazem and Ramipril daily  Hypothyroidism   takes Synthroid daily  Left knee DJD   Macular degeneration   Nocturia   Palpitations   started in 2013 and only occasionally;last time noticed about 2-3wks  ago and after drinking caffeine  Peripheral vascular disease (HCC)   PONV (postoperative nausea and vomiting)   problems swallowing after anethesia- 2/2023and slurred speech also  Pulmonary embolism with acute cor pulmonale (HCC) 07/2018  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  Sleep apnea   does not uses cpap Past Surgical History: Past Surgical History: Procedure Laterality Date  3-day EVENT MONITOR  01/2019  Mostly normal monitor.  Predominantly sinus rhythm (rate range 48-105 bpm, average 67 bpm) 9 short atrial runs (fastest = 13 beats rate 135 bpm, longest ~17 seconds-101 bpm) -> not noted on symptom log.  No sustained arrhythmias (tachycardia or bradycardia), or pauses..  Symptoms are noted with PVCs.  bilateral cataract surgery     CHOLECYSTECTOMY N/A 01/10/2022  Procedure: LAPAROSCOPIC  CHOLECYSTECTOMY WITH INTRAOPERATIVE CHOLANGIOGRAM;  Surgeon: Karie Soda, MD;  Location: [facility];  Service: General;  Laterality: N/A;  COLONOSCOPY    CT ANGIOGRAM OF CHEST - PE PROTOCOL  07/2018  Bilateral nonocclusive pulmonary emboli evidence of right heart strain consistent with "submassive "/intermediate PE.  -->  Code PE called.  DILATION AND CURETTAGE OF UTERUS    multiple about 6-7 times  ESOPHAGOGASTRODUODENOSCOPY    NM VQ LUNG SCAN ([facility] HX)  11/23/2018  Low probability for PE  REVERSE SHOULDER ARTHROPLASTY Right 06/17/2023  Procedure: RIGHT REVERSE SHOULDER ARTHROPLASTY;  Surgeon: Teryl Lucy, MD;  Location: [facility];  Service: Orthopedics;  Laterality: Right;  RIGHT/LEFT HEART CATH AND CORONARY ANGIOGRAPHY N/A 08/19/2018  Procedure: RIGHT/LEFT HEART CATH AND CORONARY ANGIOGRAPHY;  Surgeon: Corky Crafts, MD;  Location: [facility] INVASIVE CV LAB;  Service: Cardiovascular:: Nonobstructive CAD.  Mild pulmonary pretension.  Mean PA pressure 26 mmHg with PA P 48/20 mmHg.  Normal LVEDP.  THYROID SURGERY Right 2010  TOTAL HIP ARTHROPLASTY Left 2005  TOTAL KNEE ARTHROPLASTY Left 02/21/2014  Procedure: TOTAL KNEE ARTHROPLASTY;  Surgeon: Nilda Simmer, MD;  Location: [facility] OR;  Service: Orthopedics;  Laterality: Left;  TRANSTHORACIC ECHOCARDIOGRAM  07/2018   (In setting of acute PE) mild LVH.  EF 55 to 60%.  GR 1 DD.  Aortic sclerosis but no stenosis.  Mild regurgitation.  Mild RA dilation.  Moderate LA dilation.  Moderate tricuspid regurgitation with severe primary hypertension estimated PA pressure 71 mmHg.  RV poorly visualized  TRANSTHORACIC ECHOCARDIOGRAM  10/2018 - 11/2019  a) EF 60-65%.  GR 1 DD.  Focal basal LVH. Mod AI/ no AS. Mild LA dilation.  Normal RV size and fxn.  Mod PI w/ mild TR.  Peak PAP ~ 38 mmHg; b) Normal EF 60 to 65%.  Moderate LVH-GR 1 DD.  R WMA.  Mild AS w/ trivial AI.  Grossly normal PA, RV and RA size.  Normal RV function.  Normal PA pressures.  TRANSTHORACIC ECHOCARDIOGRAM  01/2021    EF 55%.  Normal LV function.  Mild LVH.  GR 1 DD.  Normal PA pressures.  Severe LA dilation.  (Consistent with more significant diastolic dysfunction).  Mild-possibly mild to moderate aortic insufficiency.  Borderline elevated RAP.  Trivial pericardial effusion.  (Stable)  VAGINAL HYSTERECTOMY  [address] Royce Macadamia 07/09/2023, 10:35 AM        Scheduled Meds:  sodium chloride   Intravenous Once   carvedilol  6.25 mg Oral Daily   cephALEXin  500 mg Oral Q8H   ketorolac  1 drop Both Eyes QID   latanoprost  1 drop Both Eyes QHS  levothyroxine  50 mcg Oral Q0600   pantoprazole  40 mg Oral Daily   polyethylene glycol  17 g Oral Daily   QUEtiapine  25 mg Oral QHS   rosuvastatin  20 mg Oral QPM   Continuous Infusions:  sodium chloride 75 mL/hr at 07/10/23 0835     LOS: 5 days    Time spent: 35 minutes    Dorcas Carrow, MD [facility]

## 2023-07-10 NOTE — Plan of Care (Signed)
  Problem: Pain Management: Goal: Pain level will decrease with appropriate interventions Outcome: Progressing   Problem: Education: Goal: Knowledge of General Education information will improve Description: Including pain rating scale, medication(s)/side effects and non-pharmacologic comfort measures Outcome: Progressing   Problem: Health Behavior/Discharge Planning: Goal: Ability to manage health-related needs will improve Outcome: Progressing   Problem: Clinical Measurements: Goal: Ability to maintain clinical measurements within normal limits will improve Outcome: Progressing

## 2023-07-11 DIAGNOSIS — N179 Acute kidney failure, unspecified: Secondary | ICD-10-CM | POA: Diagnosis not present

## 2023-07-11 LAB — BPAM RBC
Blood Product Expiration Date: 202409042359
ISSUE DATE / TIME: 202408151316
Unit Type and Rh: 6200

## 2023-07-11 LAB — CBC WITH DIFFERENTIAL/PLATELET
Abs Immature Granulocytes: 0.11 10*3/uL — ABNORMAL HIGH (ref 0.00–0.07)
Basophils Absolute: 0.1 10*3/uL (ref 0.0–0.1)
Basophils Relative: 1 %
Eosinophils Absolute: 0.3 10*3/uL (ref 0.0–0.5)
Eosinophils Relative: 4 %
HCT: 29.6 % — ABNORMAL LOW (ref 36.0–46.0)
Hemoglobin: 9.3 g/dL — ABNORMAL LOW (ref 12.0–15.0)
Immature Granulocytes: 1 %
Lymphocytes Relative: 20 %
Lymphs Abs: 1.6 10*3/uL (ref 0.7–4.0)
MCH: 30.1 pg (ref 26.0–34.0)
MCHC: 31.4 g/dL (ref 30.0–36.0)
MCV: 95.8 fL (ref 80.0–100.0)
Monocytes Absolute: 1.1 10*3/uL — ABNORMAL HIGH (ref 0.1–1.0)
Monocytes Relative: 14 %
Neutro Abs: 4.7 10*3/uL (ref 1.7–7.7)
Neutrophils Relative %: 60 %
Platelets: 272 10*3/uL (ref 150–400)
RBC: 3.09 MIL/uL — ABNORMAL LOW (ref 3.87–5.11)
RDW: 15 % (ref 11.5–15.5)
WBC: 7.9 10*3/uL (ref 4.0–10.5)
nRBC: 0 % (ref 0.0–0.2)

## 2023-07-11 LAB — BASIC METABOLIC PANEL
Anion gap: 7 (ref 5–15)
BUN: 31 mg/dL — ABNORMAL HIGH (ref 8–23)
CO2: 15 mmol/L — ABNORMAL LOW (ref 22–32)
Calcium: 7.7 mg/dL — ABNORMAL LOW (ref 8.9–10.3)
Chloride: 115 mmol/L — ABNORMAL HIGH (ref 98–111)
Creatinine, Ser: 1.02 mg/dL — ABNORMAL HIGH (ref 0.44–1.00)
GFR, Estimated: 57 mL/min — ABNORMAL LOW (ref >60)
Glucose, Bld: 72 mg/dL (ref 70–99)
Potassium: 3.6 mmol/L (ref 3.5–5.1)
Sodium: 137 mmol/L (ref 135–145)

## 2023-07-11 LAB — TYPE AND SCREEN
ABO/RH(D): A POS
Antibody Screen: NEGATIVE
Unit division: 0

## 2023-07-11 LAB — MAGNESIUM: Magnesium: 1.6 mg/dL — ABNORMAL LOW (ref 1.7–2.4)

## 2023-07-11 LAB — GLUCOSE, CAPILLARY
Glucose-Capillary: 70 mg/dL (ref 70–99)
Glucose-Capillary: 100 mg/dL — ABNORMAL HIGH (ref 70–99)
Glucose-Capillary: 106 mg/dL — ABNORMAL HIGH (ref 70–99)
Glucose-Capillary: 122 mg/dL — ABNORMAL HIGH (ref 70–99)

## 2023-07-11 MED ORDER — MAGNESIUM SULFATE 2 GM/50ML IV SOLN
2.0000 g | Freq: Once | INTRAVENOUS | Status: AC
  Administered 2023-07-11: 2 g via INTRAVENOUS
  Filled 2023-07-11: qty 50

## 2023-07-11 NOTE — Progress Notes (Signed)
Physical Therapy Treatment Patient Details Name: Tricia Harrison MRN: [id number] DOB: [birth year] Today's Date: 07/11/2023   History of Present Illness Patient is a 77 year old female sent from her rehab facility due to decreased oral intake, several episodes of nausea and emesis earlier this week.  She has also been having hematochezia at the SNF. Pt recently s/p reverse R  TSA on 06/17/23 and discharged to SNF.  PMH: DVT,CAD,CKD, HTN, LTKA, DM, PE    PT Comments   Pt admitted with above diagnosis.  Pt currently with functional limitations due to the deficits listed below (see PT Problem List). Pt in bed when therapist arrived. Pt required encouragement for participation, demonstrated minimal initiation of movement to assist with bed mobility or sitting balance EOB and has exhibited minimal progress toward goals at this time. Pt continues to require extensive assist for bed mobility, max A x 2 to total A x 2 and mod to max A for sitting balance EOB limited by fatigue, poor activity tolerance and reports of back and abdominal pain. Pt left in bed all needs in place. NT reports minimal PO intake today. Pt d/c plan, continued inpatient follow up therapy, <3 hours/day remains appropriate. Pt will benefit from acute skilled PT to increase their independence and safety with mobility to allow discharge.      If plan is discharge home, recommend the following: Two people to help with walking and/or transfers;Assistance with cooking/housework;Assist for transportation;Help with stairs or ramp for entrance;A lot of help with bathing/dressing/bathroom   Can travel by private vehicle     No  Equipment Recommendations  None recommended by PT    Recommendations for Other Services       Precautions / Restrictions Precautions Precautions: Fall;Shoulder Type of Shoulder Precautions: no ROM shoulder, OK for hand wrist and elbow ROM, per phone call on 7/24 with Dr.Landau patient is ok to use RUE on  rollator for transfers and functional mobility. Shoulder Interventions: Shoulder sling/immobilizer;At all times;Off for dressing/bathing/exercises Precaution Booklet Issued: Yes (comment) Precaution Comments: no, flexion/ABD right shoulder, may place hand on rollator and bear weight to RUE to transfer (all per previous admission about 2 weeks ago) Required Braces or Orthoses: Sling Restrictions Weight Bearing Restrictions: Yes RUE Weight Bearing: Non weight bearing Other Position/Activity Restrictions: OK to WB on RW for transfers     Mobility  Bed Mobility Overal bed mobility: Needs Assistance Bed Mobility: Rolling, Supine to Sit, Sit to Supine (Rolling to the left) Rolling: Max assist   Supine to sit: Max assist, +2 for physical assistance, Used rails Sit to supine: Total assist, +2 for physical assistance   General bed mobility comments: increased time, hand over hand A to place L UE at hand rail,  required max A for scooting in sitting to EOB and  total assist x2 for scooting to the head of the bed, absent initiation of movement for B LE to reutrn to bed and total A    Transfers                   General transfer comment: NT for pt and staff safety    Ambulation/Gait               General Gait Details: NT for pt and staff safety   Stairs             Wheelchair Mobility     Tilt Bed    Modified Rankin (Stroke Patients Only)  Balance Overall balance assessment: Needs assistance, History of Falls Sitting-balance support: Feet supported, Single extremity supported Sitting balance-Leahy Scale: Poor Sitting balance - Comments: constant multimodal cues and encouragement pt able to maintain midline briefly with B LE and L UE support however quickly required mod assist/support with fatigue and posterior  and L lateral lean, pt limited on sitting tolerance 4:21 min due to reports of abdominal pain 10/10 and back pain Postural control: Right lateral  lean     Standing balance comment: NT                            Cognition Arousal: Lethargic, Suspect due to medications Behavior During Therapy: Flat affect Overall Cognitive Status: Difficult to assess Area of Impairment: Following commands, Attention                     Memory: Decreased short-term memory   Safety/Judgement: Decreased awareness of deficits, Decreased awareness of safety     General Comments: pt noted slight change in presentation wtih alertness and speach patterns with increased difficulty enuciating and appropriate response to questions        Exercises      General Comments        Pertinent Vitals/Pain Pain Assessment Pain Assessment: 0-10 Pain Score: 10-Worst pain ever Pain Location: back and abdominal pain Pain Descriptors / Indicators: Discomfort, Constant, Cramping Pain Intervention(s): Limited activity within patient's tolerance, Monitored during session    Home Living                          Prior Function            PT Goals (current goals can now be found in the care plan section) Acute Rehab PT Goals Patient Stated Goal: to return home PT Goal Formulation: With patient/family Time For Goal Achievement: 07/19/23 Potential to Achieve Goals: Fair Progress towards PT goals: Not progressing toward goals - comment    Frequency    Min 1X/week      PT Plan Current plan remains appropriate    Co-evaluation              AM-PAC PT "6 Clicks" Mobility   Outcome Measure  Help needed turning from your back to your side while in a flat bed without using bedrails?: Total Help needed moving from lying on your back to sitting on the side of a flat bed without using bedrails?: Total Help needed moving to and from a bed to a chair (including a wheelchair)?: Total Help needed standing up from a chair using your arms (e.g., wheelchair or bedside chair)?: Total Help needed to walk in hospital room?:  Total Help needed climbing 3-5 steps with a railing? : Total 6 Click Score: 6    End of Session   Activity Tolerance: Patient limited by pain;Patient limited by fatigue Patient left: in bed;with call bell/phone within reach;with bed alarm set Nurse Communication: Need for lift equipment;Mobility status;Weight bearing status PT Visit Diagnosis: Muscle weakness (generalized) (M62.81);Difficulty in walking, not elsewhere classified (R26.2)     Time: 1610-9604 PT Time Calculation (min) (ACUTE ONLY): 18 min  Charges:    $Therapeutic Activity: 8-22 mins PT General Charges $$ ACUTE PT VISIT: 1 Visit                     Johnny Bridge, PT Acute Rehab    Jacqualyn Posey 07/11/2023,  1:59 PM

## 2023-07-11 NOTE — Progress Notes (Signed)
Speech Language Pathology Treatment: Dysphagia  Patient Details Name: Tricia Harrison MRN: [id number] DOB: December 30, 1945 Today's Date: 07/11/2023 Time: 7829-5621 SLP Time Calculation (min) (ACUTE ONLY): 10 min  Assessment / Plan / Recommendation Clinical Impression  Pt seen at bedside for education following MBS. Nursing reports tolerance of current diet. Pt was resting in bed, no family present. SLP reviewed results of MBS, including post-swallow residue necessitating multiple swallows and alternating solids/liquids, as well as the absence of penetration/aspiration. Pt verbalized awareness of globus sensation and the need for multiple swallows. Safe swallow precautions updated at head of bed to include alternating solid/liquid boluses. No further skilled ST intervention recommended at this time. Will sign off - please reconsult if needs arise.    HPI HPI: 77 year old with history of chronic anemia, chronic back pain, chronic constipation, CKD stage IIIb, hypertension and hyperlipidemia, hypothyroidism, dementia, history of pulmonary embolism recently underwent right shoulder replacement, significant postoperative delirium and confusion and was ultimately sent to skilled nursing rehab.  Patient was sent back from the facility with decreased oral intake, nausea and emesis as well as diarrhea along with stool mixed with blood after using laxatives.   Chest x-ray low lung volumes without evidence of acute cardiovascular disease. BSE with possible velopharyngeal dysfunction with hypernasality and complaints of nasal regugitation. MBS 01/14/22 without penetration or aspiration but possible decreased UES opening with barium pooled above UES. Continue Dys 3/thin.      SLP Plan  Discharge SLP treatment due to goals met.     Recommendations for follow up therapy are one component of a multi-disciplinary discharge planning process, led by the attending physician.  Recommendations may be updated based on  patient status, additional functional criteria and insurance authorization.    Recommendations  Diet recommendations: Dysphagia 3 (mechanical soft);Thin liquid Liquids provided via: Cup;Straw Medication Administration: Crushed with puree Supervision: Patient able to self feed Compensations: Slow rate;Small sips/bites;Follow solids with liquid;Multiple dry swallows after each bite/sip Postural Changes and/or Swallow Maneuvers: Seated upright 90 degrees;Upright 30-60 min after meal           Oral care BID     Dysphagia, oropharyngeal phase (R13.12)     Discharge SLP treatment due to goals met    Randie Tallarico B. Murvin Natal, [facility], CCC-SLP Speech Language Pathologist Office: [phone]  Leigh Aurora 07/11/2023, 10:11 AM

## 2023-07-11 NOTE — Progress Notes (Signed)
PROGRESS NOTE    Tricia Harrison  ZOX:[id number] DOB: November 16, 1946 DOA: 07/04/2023 PCP: Eloisa Northern, MD    Brief Narrative:  77 year old with history of chronic anemia, chronic back pain, chronic constipation, CKD stage IIIb, hypertension and hyperlipidemia, hypothyroidism, dementia, history of pulmonary embolism currently on apixaban who recently underwent right shoulder replacement, significant postoperative delirium and confusion and was ultimately sent to skilled nursing rehab.  Patient was sent back from the facility with decreased oral intake, nausea and emesis as well as diarrhea along with stool mixed with blood after using laxatives.  She was also more confused than usual.  In the emergency room hemoglobin 8.9 at about baseline.  Creatinine 2.7 with baseline creatinine of 1.5-1.6.  Chest x-ray low lung volumes without evidence of acute cardiovascular disease.  99% on room air.  Found significantly dehydrated, complaint of diarrhea.  Admitted due to significant findings. Recurrent urinary retention, Foley catheter placed 8/13 with 1300 mL urine output followed by hemorrhagic urine. CT scan with bilateral hydronephrosis due to urinary retention, rectum full of stool.  Flexible sigmoidoscopy with no mass, stool evacuated.  8/16, some clinical improvement today.  Hematuria is subsiding.  Will monitor patient next 24 hours rechallenging with Eliquis and stopping IV antibiotics if she remains stable.   Assessment & Plan:   Acute kidney injury with chronic kidney disease stage IIIb: Historical baseline creatinine 1.5.  Presented with creatinine 2.7.  Did not respond to IV fluids, responded to Foley catheter placement.   Creatinine further improved to 1 today. Prerenal with poor appetite, poor oral intake.  Valsartan use. Found to have significant urine retention with hydronephrosis, Foley catheter with 1300 mL retention. Discontinue IV fluids today and recheck levels tomorrow  morning. Continue Foley catheter until adequate mobility.  Planning to discharge with Foley catheter. Encourage oral dietary and fluid intake.  Hematuria: Suspected secondary to decompression of bladder in a patient who is taking Eliquis.  Hematuria is improving.  Started back on Eliquis 8/15.  Will monitor. Hemoglobin dropped to 7.3-1 unit of PRBC transfusion and appropriate response.  Recheck tomorrow morning.  Acute on chronic anemia, anemia of acute blood loss due to hematuria:  Hemoglobin 7.3.  1 unit PRBC transfusion.  Appropriate response.    Acute UTI, hemorrhagic cystitis, urinary retention: Urine culture with multiple species.  Will treat as complicated UTI with oral antibiotics for total 10 days.  Now on Keflex.  Chronic constipation now with diarrhea: With evidence of stool in the rectal vault.  Flex sig.  Continue bowel regimen.  Avoid constipation. Rectal bleeding has stopped. C. difficile and GI pathogen panel is negative.  Type 2 diabetes, diet controlled.  On carb modified diet. Essential hypertension, dehydrated.  Holding antihypertensives. Hypothyroidism, Synthroid.  Continue. GERD, on Prilosec.  History of pulmonary embolism on Eliquis: Back on Eliquis.  Cognitive decline: Patient will need formal neurocognitive evaluation.  Family requested neurology consult.  I have made ambulatory referral to [facility] neurology to be seen as a routine consult outpatient.  Recent right shoulder surgery, difficult recovery.  Start working with PT OT.  Anticipate back to SNF.  Continue to mobilize.  Speech following.  On dysphagia diet.  DVT prophylaxis: SCDs Start: 07/04/23 1731 apixaban (ELIQUIS) tablet 5 mg   Code Status: Full code Family Communication: Patient's son and daughter-in-law over the phone. Disposition Plan: Status is: Inpatient.  Monitoring renal functions and hemoglobin.     Consultants:  GI  Procedures:  Flex sig  Antimicrobials:  Rocephin 8/11---  8/15 Keflex 8/15---   Subjective:  Patient seen and examined.  Still has diffuse lower abdominal pain but better than before.  Patient tells me she had a good bowel movement last night. Foley catheter with amber-colored urine with some sediments.  Flowing freely. Called and discussed with family, challenging her with discontinuing IV fluids and starting back on Eliquis today and if no evidence of bleeding will be going back to a SNF tomorrow.  Family is agreeable.  Objective: Vitals:   07/10/23 1655 07/10/23 2018 07/11/23 0500 07/11/23 0527  BP: (!) 148/85 (!) 152/76 (!) 150/84 (!) 152/72  Pulse: 72 75  62  Resp: 18 18 18 17   Temp: 98.4 F (36.9 C) 98.7 F (37.1 C) 98.8 F (37.1 C) 97.8 F (36.6 C)  TempSrc: Oral Oral Oral Oral  SpO2: 99% 99% 99% 99%  Weight:      Height:        Intake/Output Summary (Last 24 hours) at 07/11/2023 1410 Last data filed at 07/11/2023 1229 Gross per 24 hour  Intake 946.17 ml  Output 1650 ml  Net -703.83 ml   Filed Weights   07/07/23 1009  Weight: 79 kg    Examination:  General: Frail and debilitated.  Chronically sick looking.  Flat affect.  Alert awake and mostly oriented. Cardiovascular: S1-S2 normal.  Regular rate rhythm. Respiratory: Bilateral clear.  No added sounds. Gastrointestinal: Soft.  Nontender.  Pendulous.  No rigidity or guarding. Foley catheter with amber-colored dark urine. Ext: No edema.  No cyanosis.   Data Reviewed: I have personally reviewed following labs and imaging studies  CBC: Recent Labs  Lab 07/06/23 0731 07/07/23 0629 07/08/23 0740 07/10/23 0551 07/11/23 0521  WBC 9.3 11.1* 10.4 7.1 7.9  NEUTROABS 6.4 7.0 7.2 4.6 4.7  HGB 8.4* 8.3* 8.0* 7.3* 9.3*  HCT 26.2* 25.5* 26.4* 23.7* 29.6*  MCV 94.9 94.8 98.9 99.2 95.8  PLT 482* 432* 379 302 272   Basic Metabolic Panel: Recent Labs  Lab 07/06/23 0731 07/07/23 0629 07/08/23 0740 07/10/23 0551 07/11/23 0521  NA 138 136 136 136 137  K 3.6 3.7 3.7  3.5 3.6  CL 112* 113* 112* 116* 115*  CO2 16* 15* 15* 14* 15*  GLUCOSE 71 97 83 78 72  BUN 61* 57* 56* 40* 31*  CREATININE 1.92* 1.93* 1.98* 1.35* 1.02*  CALCIUM 7.9* 7.7* 7.9* 7.4* 7.7*  MG  --  1.8  --   --  1.6*   GFR: Estimated Creatinine Clearance: 44 mL/min (A) (by C-G formula based on SCr of 1.02 mg/dL (H)). Liver Function Tests: Recent Labs  Lab 07/04/23 1511 07/05/23 0842  AST 35 27  ALT 15 15  ALKPHOS 47 40  BILITOT 0.5 0.6  PROT 5.9* 5.5*  ALBUMIN 2.3* 2.4*   No results for input(s): "LIPASE", "AMYLASE" in the last 168 hours. No results for input(s): "AMMONIA" in the last 168 hours. Coagulation Profile: No results for input(s): "INR", "PROTIME" in the last 168 hours. Cardiac Enzymes: Recent Labs  Lab 07/04/23 2116  CKTOTAL 110   BNP (last 3 results) No results for input(s): "PROBNP" in the last 8760 hours. HbA1C: No results for input(s): "HGBA1C" in the last 72 hours. CBG: Recent Labs  Lab 07/10/23 1217 07/10/23 1641 07/10/23 2043 07/11/23 0748 07/11/23 1149  GLUCAP 86 77 88 70 100*   Lipid Profile: No results for input(s): "CHOL", "HDL", "LDLCALC", "TRIG", "CHOLHDL", "LDLDIRECT" in the last 72 hours. Thyroid Function Tests: No results for input(s): "TSH", "T4TOTAL", "FREET4", "  T3FREE", "THYROIDAB" in the last 72 hours. Anemia Panel: No results for input(s): "VITAMINB12", "FOLATE", "FERRITIN", "TIBC", "IRON", "RETICCTPCT" in the last 72 hours. Sepsis Labs: No results for input(s): "PROCALCITON", "LATICACIDVEN" in the last 168 hours.  Recent Results (from the past 240 hour(s))  C Difficile Quick Screen w PCR reflex     Status: None   Collection Time: 07/05/23 12:30 PM   Specimen: Rectum; Stool  Result Value Ref Range Status   C Diff antigen NEGATIVE NEGATIVE Final   C Diff toxin NEGATIVE NEGATIVE Final   C Diff interpretation No C. difficile detected.  Final    Comment: Performed at [facility], [address]  [place], [place] [address]  Gastrointestinal Panel by PCR , Stool     Status: None   Collection Time: 07/05/23 12:30 PM   Specimen: Rectum; Stool  Result Value Ref Range Status   Campylobacter species NOT DETECTED NOT DETECTED Final   Plesimonas shigelloides NOT DETECTED NOT DETECTED Final   Salmonella species NOT DETECTED NOT DETECTED Final   Yersinia enterocolitica NOT DETECTED NOT DETECTED Final   Vibrio species NOT DETECTED NOT DETECTED Final   Vibrio cholerae NOT DETECTED NOT DETECTED Final   Enteroaggregative E coli (EAEC) NOT DETECTED NOT DETECTED Final   Enteropathogenic E coli (EPEC) NOT DETECTED NOT DETECTED Final   Enterotoxigenic E coli (ETEC) NOT DETECTED NOT DETECTED Final   Shiga like toxin producing E coli (STEC) NOT DETECTED NOT DETECTED Final   Shigella/Enteroinvasive E coli (EIEC) NOT DETECTED NOT DETECTED Final   Cryptosporidium NOT DETECTED NOT DETECTED Final   Cyclospora cayetanensis NOT DETECTED NOT DETECTED Final   Entamoeba histolytica NOT DETECTED NOT DETECTED Final   Giardia lamblia NOT DETECTED NOT DETECTED Final   Adenovirus F40/41 NOT DETECTED NOT DETECTED Final   Astrovirus NOT DETECTED NOT DETECTED Final   Norovirus GI/GII NOT DETECTED NOT DETECTED Final   Rotavirus A NOT DETECTED NOT DETECTED Final   Sapovirus (I, II, IV, and V) NOT DETECTED NOT DETECTED Final    Comment: Performed at [facility], [address] [place], [place] [address]  Culture, [place] Urine     Status: Abnormal   Collection Time: 07/05/23  3:00 PM   Specimen: Urine, Catheterized  Result Value Ref Range Status   Specimen Description   Final    URINE, CATHETERIZED Performed at [facility], [address], [place], [place] [address]    Special Requests   Final    URINE, RANDOM Performed at [facility], [address], [place], [place] [address]    Culture (A)  Final    MULTIPLE SPECIES PRESENT, SUGGEST RECOLLECTION NO GROUP B STREP  (S.AGALACTIAE) ISOLATED Performed at [facility], [address], [place], [place] [address]    Report Status 07/07/2023 FINAL  Final  Urine Culture (for pregnant, neutropenic or urologic patients or patients with an indwelling urinary catheter)     Status: Abnormal   Collection Time: 07/05/23  3:00 PM   Specimen: Urine, Clean Catch  Result Value Ref Range Status   Specimen Description   Final    URINE, CLEAN CATCH Performed at [facility], [address] [place], [place] [address]    Special Requests   Final    NONE Performed at [facility], [address], [place], [place] [address]    Culture MULTIPLE SPECIES PRESENT, SUGGEST RECOLLECTION (A)  Final   Report Status 07/07/2023 FINAL  Final         Radiology Studies: No results found.      Scheduled Meds:  apixaban  5 mg Oral BID   carvedilol  6.25 mg Oral Daily   cephALEXin  500 mg Oral Q8H   ketorolac  1 drop Both Eyes QID   latanoprost  1 drop Both Eyes QHS   levothyroxine  50 mcg Oral Q0600   pantoprazole  40 mg Oral Daily   polyethylene glycol  17 g Oral Daily   QUEtiapine  25 mg Oral QHS   rosuvastatin  20 mg Oral QPM   Continuous Infusions:     LOS: 6 days    Time spent: 35 minutes    Dorcas Carrow, MD [facility]

## 2023-07-11 NOTE — Care Management Important Message (Signed)
Important Message  Patient Details IM Letter given Name: Tricia Harrison MRN: [id number] Date of Birth: 1946/05/16   Medicare Important Message Given:  Yes     [name] 07/11/2023, 4:04 PM

## 2023-07-12 DIAGNOSIS — N179 Acute kidney failure, unspecified: Secondary | ICD-10-CM | POA: Diagnosis not present

## 2023-07-12 LAB — CBC WITH DIFFERENTIAL/PLATELET
Abs Immature Granulocytes: 0 10*3/uL (ref 0.00–0.07)
Basophils Absolute: 0.1 10*3/uL (ref 0.0–0.1)
Basophils Relative: 1 %
Eosinophils Absolute: 0 10*3/uL (ref 0.0–0.5)
Eosinophils Relative: 0 %
HCT: 29.1 % — ABNORMAL LOW (ref 36.0–46.0)
Hemoglobin: 9.2 g/dL — ABNORMAL LOW (ref 12.0–15.0)
Lymphocytes Relative: 15 %
Lymphs Abs: 1.1 10*3/uL (ref 0.7–4.0)
MCH: 30.1 pg (ref 26.0–34.0)
MCHC: 31.6 g/dL (ref 30.0–36.0)
MCV: 95.1 fL (ref 80.0–100.0)
Monocytes Absolute: 0.8 10*3/uL (ref 0.1–1.0)
Monocytes Relative: 11 %
Neutro Abs: 5.2 10*3/uL (ref 1.7–7.7)
Neutrophils Relative %: 73 %
Platelets: 274 10*3/uL (ref 150–400)
RBC: 3.06 MIL/uL — ABNORMAL LOW (ref 3.87–5.11)
RDW: 15.5 % (ref 11.5–15.5)
WBC: 7.1 10*3/uL (ref 4.0–10.5)
nRBC: 0 % (ref 0.0–0.2)

## 2023-07-12 LAB — GLUCOSE, CAPILLARY
Glucose-Capillary: 80 mg/dL (ref 70–99)
Glucose-Capillary: 99 mg/dL (ref 70–99)
Glucose-Capillary: 91 mg/dL (ref 70–99)
Glucose-Capillary: 78 mg/dL (ref 70–99)

## 2023-07-12 LAB — BASIC METABOLIC PANEL
Anion gap: 5 (ref 5–15)
BUN: 29 mg/dL — ABNORMAL HIGH (ref 8–23)
CO2: 16 mmol/L — ABNORMAL LOW (ref 22–32)
Calcium: 7.8 mg/dL — ABNORMAL LOW (ref 8.9–10.3)
Chloride: 117 mmol/L — ABNORMAL HIGH (ref 98–111)
Creatinine, Ser: 1 mg/dL (ref 0.44–1.00)
GFR, Estimated: 58 mL/min — ABNORMAL LOW (ref >60)
Glucose, Bld: 81 mg/dL (ref 70–99)
Potassium: 3.7 mmol/L (ref 3.5–5.1)
Sodium: 138 mmol/L (ref 135–145)

## 2023-07-12 MED ORDER — BISACODYL 10 MG RE SUPP
10.0000 mg | Freq: Every day | RECTAL | Status: DC | PRN
  Administered 2023-07-12: 10 mg via RECTAL
  Filled 2023-07-12: qty 1

## 2023-07-12 MED ORDER — ALUM & MAG HYDROXIDE-SIMETH 200-200-20 MG/5ML PO SUSP
30.0000 mL | Freq: Four times a day (QID) | ORAL | Status: DC | PRN
  Administered 2023-07-12: 30 mL via ORAL
  Filled 2023-07-12: qty 30

## 2023-07-12 NOTE — Hospital Course (Signed)
77 year old with history of chronic anemia, chronic back pain, chronic constipation, CKD stage IIIb, hypertension and hyperlipidemia, hypothyroidism, dementia, history of pulmonary embolism currently on apixaban who recently underwent right shoulder replacement, significant postoperative delirium and confusion and was ultimately sent to skilled nursing rehab.  Patient was sent back from the facility with decreased oral intake, nausea and emesis as well as diarrhea along with stool mixed with blood after using laxatives.  She was also more confused than usual.  In the emergency room hemoglobin 8.9 at about baseline.  Creatinine 2.7 with baseline creatinine of 1.5-1.6.  Chest x-ray low lung volumes without evidence of acute cardiovascular disease.  99% on room air.  Found significantly dehydrated, complaint of diarrhea.  Admitted due to significant findings. Recurrent urinary retention, Foley catheter placed 8/13 with 1300 mL urine output followed by hemorrhagic urine. CT scan with bilateral hydronephrosis due to urinary retention, rectum full of stool.  Flexible sigmoidoscopy with no mass, stool evacuated.  8/16, some clinical improvement  and hematuria is subsiding-started on Eliquis and monitored overnight.  Overnight hemoglobin remained stable vital stable.

## 2023-07-12 NOTE — Progress Notes (Signed)
PROGRESS NOTE Tricia Harrison  ZOX:[id number] DOB: September 13, 1946 DOA: 07/04/2023 PCP: Eloisa Northern, MD  Brief Narrative/Hospital Course: 77 year old with history of chronic anemia, chronic back pain, chronic constipation, CKD stage IIIb, hypertension and hyperlipidemia, hypothyroidism, dementia, history of pulmonary embolism currently on apixaban who recently underwent right shoulder replacement, significant postoperative delirium and confusion and was ultimately sent to skilled nursing rehab.  Patient was sent back from the facility with decreased oral intake, nausea and emesis as well as diarrhea along with stool mixed with blood after using laxatives.  She was also more confused than usual.  In the emergency room hemoglobin 8.9 at about baseline.  Creatinine 2.7 with baseline creatinine of 1.5-1.6.  Chest x-ray low lung volumes without evidence of acute cardiovascular disease.  99% on room air.  Found significantly dehydrated, complaint of diarrhea.  Admitted due to significant findings. Recurrent urinary retention, Foley catheter placed 8/13 with 1300 mL urine output followed by hemorrhagic urine. CT scan with bilateral hydronephrosis due to urinary retention, rectum full of stool.  Flexible sigmoidoscopy with no mass, stool evacuated.  8/16, some clinical improvement  and hematuria is subsiding-started on Eliquis and monitored overnight.  Overnight hemoglobin remained stable vital stable.     Subjective: Patient seen and examined this morning. Mild abdominal pain in the central abdomen, complains of constipation, added maalox  Urine appears darkish in the Foley catheter, reports her urine has been dark like this for some time and the Foley   Assessment and Plan: Principal Problem:   AKI (acute kidney injury) (HCC) Active Problems:   Essential hypertension   Hypothyroidism   Hyperlipidemia LDL goal <100   Hernia, hiatal   GERD (gastroesophageal reflux disease)   Type 2 diabetes mellitus  (HCC)   Obstructive sleep apnea   Atherosclerosis of aorta (HCC)   Chronic kidney disease, stage 3b (HCC)   Thrombocytosis   Chronic constipation   Acute on chronic blood loss anemia   Grade I diastolic dysfunction   Acute kidney injury (AKI) with acute tubular necrosis (ATN) (HCC)   Rectal bleeding   Abnormal CT scan, colon   AKI on CKD 3B : Historical baseline creatinine 1.5.  Presented with creatinine 2.7.  Did not respond to IV fluids, found to have significant retention with hydronephrosis - responded to Foley catheter placement.  Also with poor appetite poor oral intake, on valsartan at home. Creatinine has normalized 1.0.  Encourage oral intake continue Foley catheter until adequate mobility, will discharge with Foley catheter Recent Labs    06/04/23 1051 06/18/23 0859 07/04/23 1511 07/05/23 0842 07/06/23 0731 07/07/23 0629 07/08/23 0740 07/10/23 0551 07/11/23 0521 07/12/23 0623  BUN 32* 30* 77* 68* 61* 57* 56* 40* 31* 29*  CREATININE 1.49* 1.53* 2.70* 2.18* 1.92* 1.93* 1.98* 1.35* 1.02* 1.00  CO2 24 21* 18* 16* 16* 15* 15* 14* 15* 16*    Hematuria Acute blood loss anemia with hematuria in the setting of chronic anemia : Suspected secondary to decompression of bladder in a patient who is taking Eliquis.  Urine appears dark but has been like this per patient.  Eliquis started back on 8/15, trend H&H if remains stable tomorrow plan for discharge.  Patient had received 1 L PRBC this admission. Recent Labs    07/07/23 0629 07/08/23 0740 07/10/23 0551 07/11/23 0521 07/12/23 0623  HGB 8.3* 8.0* 7.3* 9.3* 9.2*  MCV 94.8 98.9 99.2 95.8 95.1    Acute UTI, hemorrhagic cystitis, urinary retention: Urine culture with multiple species.  Continue oral  Keflex   Chronic constipation now with diarrhea: With evidence of stool in the rectal vault.  Patient reports no BM last BM was 8/16 continue MiraLAX PRN. DULCOLAX IF NEED Rectal bleeding has stopped. C. difficile and GI  pathogen panel is negative.   Type 2 diabetes, diet controlled.  On carb modified diet.  Essential hypertension Dehydrated: BP stable cont coreg  Hypothyroidism: Cont synthroid.  GERD: Cont Prilosec.   History of pulmonary embolism on Eliquis:  Cont on Eliquis.   Cognitive decline: Patient will need formal neurocognitive evaluation.  Family requested neurology consult.  I have made ambulatory referral to [facility] neurology to be seen as a routine consult outpatient.   Recent right shoulder surgery, difficult recovery: Cont PT OT.  Anticipate back to SNF. Continue to mobilize.  Speech following.  On dysphagia diet.  Class 1 Obesity:Patient's Body mass index is 32.91 kg/m. : Will benefit with PCP follow-up, weight loss  healthy lifestyle and outpatient sleep evaluation.   DVT prophylaxis: SCDs Start: 07/04/23 1731 Code Status:   Code Status: Full Code Family Communication: plan of care discussed with patient/no family at bedside. Patient status is: Inpatient because of the weakness and hematuria Level of care: Med-Surg   Dispo: The patient is from: SNF            Anticipated disposition: SNF tomorrow if hh stable  Objective: Vitals last 24 hrs: Vitals:   07/11/23 0527 07/11/23 1426 07/11/23 2106 07/12/23 0513  BP: (!) 152/72 (!) 149/88 129/75 (!) 142/68  Pulse: 62 65 72 68  Resp: 17 16 14 14   Temp: 97.8 F (36.6 C) 98.1 F (36.7 C) 98.7 F (37.1 C) 98.1 F (36.7 C)  TempSrc: Oral Oral Oral Oral  SpO2: 99% 96% 100% 99%  Weight:      Height:       Weight change:   Physical Examination: General exam: alert awake, older than stated age HEENT:Oral mucosa moist, Ear/Nose WNL grossly Respiratory system: bilaterally clear BS, no use of accessory muscle Cardiovascular system: S1 & S2 +, No JVD. Gastrointestinal system: Abdomen soft,NT,ND, BS+ Nervous System:Alert, awake, moving extremities. Extremities: LE edema neg,distal peripheral pulses palpable.  Skin: No  rashes,no icterus. MSK: Normal muscle bulk,tone, power  Medications reviewed:  Scheduled Meds:  apixaban  5 mg Oral BID   carvedilol  6.25 mg Oral Daily   cephALEXin  500 mg Oral Q8H   ketorolac  1 drop Both Eyes QID   latanoprost  1 drop Both Eyes QHS   levothyroxine  50 mcg Oral Q0600   pantoprazole  40 mg Oral Daily   polyethylene glycol  17 g Oral Daily   QUEtiapine  25 mg Oral QHS   rosuvastatin  20 mg Oral QPM   Continuous Infusions:  Diet Order             DIET DYS 3 Room service appropriate? Yes with Assist; Fluid consistency: Thin  Diet effective now                   Intake/Output Summary (Last 24 hours) at 07/12/2023 1305 Last data filed at 07/12/2023 1148 Gross per 24 hour  Intake 120 ml  Output 1025 ml  Net -905 ml   Net IO Since Admission: 6,230.32 mL [07/12/23 1305]  Wt Readings from Last 3 Encounters:  07/07/23 79 kg  06/17/23 70.3 kg  03/13/23 73.8 kg     Unresulted Labs (From admission, onward)    None     Data  Reviewed: I have personally reviewed following labs and imaging studies CBC: Recent Labs  Lab 07/07/23 0629 07/08/23 0740 07/10/23 0551 07/11/23 0521 07/12/23 0623  WBC 11.1* 10.4 7.1 7.9 7.1  NEUTROABS 7.0 7.2 4.6 4.7 5.2  HGB 8.3* 8.0* 7.3* 9.3* 9.2*  HCT 25.5* 26.4* 23.7* 29.6* 29.1*  MCV 94.8 98.9 99.2 95.8 95.1  PLT 432* 379 302 272 274   Basic Metabolic Panel: Recent Labs  Lab 07/07/23 0629 07/08/23 0740 07/10/23 0551 07/11/23 0521 07/12/23 0623  NA 136 136 136 137 138  K 3.7 3.7 3.5 3.6 3.7  CL 113* 112* 116* 115* 117*  CO2 15* 15* 14* 15* 16*  GLUCOSE 97 83 78 72 81  BUN 57* 56* 40* 31* 29*  CREATININE 1.93* 1.98* 1.35* 1.02* 1.00  CALCIUM 7.7* 7.9* 7.4* 7.7* 7.8*  MG 1.8  --   --  1.6*  --    CBG: Recent Labs  Lab 07/11/23 1149 07/11/23 1535 07/11/23 2108 07/12/23 0749 07/12/23 1112  GLUCAP 100* 106* 122* 80 99   Recent Results (from the past 240 hour(s))  C Difficile Quick Screen w PCR  reflex     Status: None   Collection Time: 07/05/23 12:30 PM   Specimen: Rectum; Stool  Result Value Ref Range Status   C Diff antigen NEGATIVE NEGATIVE Final   C Diff toxin NEGATIVE NEGATIVE Final   C Diff interpretation No C. difficile detected.  Final    Comment: Performed at [facility], [address], [place], [place] [address]  Gastrointestinal Panel by PCR , Stool     Status: None   Collection Time: 07/05/23 12:30 PM   Specimen: Rectum; Stool  Result Value Ref Range Status   Campylobacter species NOT DETECTED NOT DETECTED Final   Plesimonas shigelloides NOT DETECTED NOT DETECTED Final   Salmonella species NOT DETECTED NOT DETECTED Final   Yersinia enterocolitica NOT DETECTED NOT DETECTED Final   Vibrio species NOT DETECTED NOT DETECTED Final   Vibrio cholerae NOT DETECTED NOT DETECTED Final   Enteroaggregative E coli (EAEC) NOT DETECTED NOT DETECTED Final   Enteropathogenic E coli (EPEC) NOT DETECTED NOT DETECTED Final   Enterotoxigenic E coli (ETEC) NOT DETECTED NOT DETECTED Final   Shiga like toxin producing E coli (STEC) NOT DETECTED NOT DETECTED Final   Shigella/Enteroinvasive E coli (EIEC) NOT DETECTED NOT DETECTED Final   Cryptosporidium NOT DETECTED NOT DETECTED Final   Cyclospora cayetanensis NOT DETECTED NOT DETECTED Final   Entamoeba histolytica NOT DETECTED NOT DETECTED Final   Giardia lamblia NOT DETECTED NOT DETECTED Final   Adenovirus F40/41 NOT DETECTED NOT DETECTED Final   Astrovirus NOT DETECTED NOT DETECTED Final   Norovirus GI/GII NOT DETECTED NOT DETECTED Final   Rotavirus A NOT DETECTED NOT DETECTED Final   Sapovirus (I, II, IV, and V) NOT DETECTED NOT DETECTED Final    Comment: Performed at [facility], [address] [place], [place] [address]  Culture, [place] Urine     Status: Abnormal   Collection Time: 07/05/23  3:00 PM   Specimen: Urine, Catheterized  Result Value Ref Range Status   Specimen Description   Final     URINE, CATHETERIZED Performed at [facility], [address], [place], [place] [address]    Special Requests   Final    URINE, RANDOM Performed at [facility], [address] [place], [place] [address]    Culture (A)  Final    MULTIPLE SPECIES  PRESENT, SUGGEST RECOLLECTION NO GROUP B STREP (S.AGALACTIAE) ISOLATED Performed at [facility], [address] [place], [place] [address]    Report Status 07/07/2023 FINAL  Final  Urine Culture (for pregnant, neutropenic or urologic patients or patients with an indwelling urinary catheter)     Status: Abnormal   Collection Time: 07/05/23  3:00 PM   Specimen: Urine, Clean Catch  Result Value Ref Range Status   Specimen Description   Final    URINE, CLEAN CATCH Performed at [facility], [address], [place], [place] [address]    Special Requests   Final    NONE Performed at [facility], [address], [place], [place] [address]    Culture MULTIPLE SPECIES PRESENT, SUGGEST RECOLLECTION (A)  Final   Report Status 07/07/2023 FINAL  Final    Antimicrobials: Anti-infectives (From admission, onward)    Start     Dose/Rate Route Frequency Ordered Stop   07/10/23 1400  cephALEXin (KEFLEX) capsule 500 mg        500 mg Oral Every 8 hours 07/10/23 1258 07/15/23 1359   07/06/23 0800  cefTRIAXone (ROCEPHIN) 1 g in sodium chloride 0.9 % 100 mL IVPB  Status:  Discontinued        1 g 200 mL/hr over 30 Minutes Intravenous Every 24 hours 07/06/23 0728 07/10/23 1258      Culture/Microbiology    Component Value Date/Time   SDES  07/05/2023 1500    URINE, CATHETERIZED Performed at [facility], [address] [place], [place] [address]    SDES  07/05/2023 1500    URINE, CLEAN CATCH Performed at [facility], [address], [place], [place] [address]    SPECREQUEST  07/05/2023 1500    URINE, RANDOM Performed at  [facility], [address] [place], [place] [address]    SPECREQUEST  07/05/2023 1500    NONE Performed at [facility], [address] [place], [place] [address]    CULT (A) 07/05/2023 1500    MULTIPLE SPECIES PRESENT, SUGGEST RECOLLECTION NO GROUP B STREP (S.AGALACTIAE) ISOLATED Performed at [facility], [address], [place], [place] [address]    CULT MULTIPLE SPECIES PRESENT, SUGGEST RECOLLECTION (A) 07/05/2023 1500   REPTSTATUS 07/07/2023 FINAL 07/05/2023 1500   REPTSTATUS 07/07/2023 FINAL 07/05/2023 1500   Radiology Studies: No results found.   LOS: 7 days   Lanae Boast, MD [facility]  07/12/2023, 1:05 PM

## 2023-07-12 NOTE — Plan of Care (Signed)
  Problem: Education: Goal: Knowledge of the prescribed therapeutic regimen will improve Outcome: Progressing   Problem: Activity: Goal: Ability to tolerate increased activity will improve Outcome: Progressing   Problem: Pain Management: Goal: Pain level will decrease with appropriate interventions Outcome: Progressing   Problem: Skin Integrity: Goal: Risk for impaired skin integrity will decrease Outcome: Progressing

## 2023-07-13 DIAGNOSIS — N179 Acute kidney failure, unspecified: Secondary | ICD-10-CM | POA: Diagnosis not present

## 2023-07-13 LAB — CBC
HCT: 31.1 % — ABNORMAL LOW (ref 36.0–46.0)
Hemoglobin: 9.7 g/dL — ABNORMAL LOW (ref 12.0–15.0)
MCH: 30.6 pg (ref 26.0–34.0)
MCHC: 31.2 g/dL (ref 30.0–36.0)
MCV: 98.1 fL (ref 80.0–100.0)
Platelets: 276 10*3/uL (ref 150–400)
RBC: 3.17 MIL/uL — ABNORMAL LOW (ref 3.87–5.11)
RDW: 15.5 % (ref 11.5–15.5)
WBC: 7.5 10*3/uL (ref 4.0–10.5)
nRBC: 0 % (ref 0.0–0.2)

## 2023-07-13 LAB — GLUCOSE, CAPILLARY
Glucose-Capillary: 69 mg/dL — ABNORMAL LOW (ref 70–99)
Glucose-Capillary: 87 mg/dL (ref 70–99)
Glucose-Capillary: 105 mg/dL — ABNORMAL HIGH (ref 70–99)
Glucose-Capillary: 71 mg/dL (ref 70–99)
Glucose-Capillary: 93 mg/dL (ref 70–99)

## 2023-07-13 MED ORDER — VITAMIN D 25 MCG (1000 UNIT) PO TABS
1000.0000 [IU] | ORAL_TABLET | Freq: Every day | ORAL | Status: DC
  Administered 2023-07-14 – 2023-07-16 (×3): 1000 [IU] via ORAL
  Filled 2023-07-13 (×3): qty 1

## 2023-07-13 MED ORDER — CHLORHEXIDINE GLUCONATE CLOTH 2 % EX PADS
6.0000 | MEDICATED_PAD | Freq: Every day | CUTANEOUS | Status: DC
  Administered 2023-07-13 – 2023-07-16 (×4): 6 via TOPICAL

## 2023-07-13 NOTE — Progress Notes (Addendum)
Hypoglycemic Event  CBG: 69  Treatment: 4 oz juice/soda  Symptoms: None  Follow-up CBG: Time: 0800   CBG Result: 87  Possible Reasons for Event: Unknown  Comments/MD notified: KC    Nonda Lou

## 2023-07-13 NOTE — Plan of Care (Signed)
  Problem: Education: Goal: Knowledge of the prescribed therapeutic regimen will improve 07/13/2023 0027 by Eilleen Kempf, RN Outcome: Progressing 07/13/2023 0027 by Eilleen Kempf, RN Outcome: Not Progressing Goal: Understanding of activity limitations/precautions following surgery will improve 07/13/2023 0027 by Eilleen Kempf, RN Outcome: Progressing 07/13/2023 0027 by Eilleen Kempf, RN Outcome: Not Progressing Goal: Individualized Educational Video(s) 07/13/2023 0027 by Eilleen Kempf, RN Outcome: Progressing 07/13/2023 0027 by Eilleen Kempf, RN Outcome: Not Progressing

## 2023-07-13 NOTE — Progress Notes (Signed)
Occupational Therapy Treatment Patient Details Name: Tricia Harrison MRN: [id number] DOB: 04/27/1946 Today's Date: 07/13/2023   History of present illness Patient is a 77 year old female sent from her rehab facility due to decreased oral intake, several episodes of nausea and emesis earlier this week.  She has also been having hematochezia at the SNF. Pt recently s/p reverse R  TSA on 06/17/23 and discharged to SNF.  PMH: DVT,CAD,CKD, HTN, LTKA, DM, PE   OT comments  Patient was noted to report " I can't during session when asked to participate in any task. Patient and family were educated on saying ill try even if things were more challenging to improve mindset and engagement in tasks. Patients family verbalized understanding and attempted to encourage patient during session. Patient noted to have continued pain impacting participation. Patient was able to use LUE to push on bed rail when first initially sitting EOB with max A and lateral lean to R side. Patient remains +2 for safe mobility at this time with family unable to support this level of care at home. Patient will benefit from continued inpatient follow up therapy, <3 hours/day       If plan is discharge home, recommend the following:  Two people to help with walking and/or transfers;Assistance with cooking/housework;Direct supervision/assist for medications management;Assist for transportation;Help with stairs or ramp for entrance;Direct supervision/assist for financial management;A lot of help with bathing/dressing/bathroom   Equipment Recommendations  None recommended by OT       Precautions / Restrictions Precautions Precautions: Fall;Shoulder Type of Shoulder Precautions: no ROM shoulder, OK for hand wrist and elbow ROM, per phone call on 7/24 with Dr.Landau patient is ok to use RUE on rollator for transfers and functional mobility. Shoulder Interventions: Shoulder sling/immobilizer;At all times;Off for  dressing/bathing/exercises Precaution Booklet Issued: Yes (comment) Precaution Comments: no, flexion/ABD right shoulder, may place hand on rollator and bear weight to RUE to transfer (all per previous admission about 2 weeks ago) Required Braces or Orthoses: Sling Restrictions Weight Bearing Restrictions: Yes RUE Weight Bearing: Non weight bearing Other Position/Activity Restrictions: OK to WB on RW for transfers       Mobility Bed Mobility Overal bed mobility: Needs Assistance Bed Mobility: Rolling, Supine to Sit, Sit to Supine Rolling: Max assist   Supine to sit: Total assist, HOB elevated, Used rails Sit to supine: HOB elevated, Total assist, Used rails   General bed mobility comments: increased time, hand over hand A to place L UE at hand rail,  required max A for scooting in sitting to EOB and  total assist for scooting to the head of the bed, absent initiation of movement for B LE to reutrn to bed and total A         Balance Overall balance assessment: Needs assistance, History of Falls Sitting-balance support: Feet supported, Single extremity supported Sitting balance-Leahy Scale: Poor Sitting balance - Comments: leaning to the R side           ADL either performed or assessed with clinical judgement   ADL Overall ADL's : Needs assistance/impaired   Eating/Feeding Details (indicate cue type and reason): patients NT and nurse in room at end of session with education provided on patient doing as much as she can for herself for self feeding and washign face ECT. patients nurse and NT verbalized understanding.     General ADL Comments: patient, patients sister, patients son and daughter in law were educated on ways for patient to engage in tasks and  ways for family to encourage patient to engage in session. patient notable frustrated with family attempting to encourage patient to participate. patient's son and daughter in law asked this therapist what the assessment was  for today and what they should do. patient's family was educated on continued encouragement and providing opportunites for patient to try to get better and participate. patients family was educated on planning for all situations so that when patient returned to [facility] and continued therapy that they had a plan for the next level of care after rehab concluded. patients son and daughter in law verbalized understanding.      Cognition Arousal: Alert Behavior During Therapy: Flat affect Overall Cognitive Status: Difficult to assess       General Comments: patient having difficult time with processing her need for intiation of tasks. patient presents with learned helplessness and family is visably frustrated with this during session. patient was unable to carryover education on ways to participate and stay positive with multiple repeate educations during session.                   Pertinent Vitals/ Pain       Pain Assessment Pain Assessment: Faces Faces Pain Scale: Hurts little more Pain Location: back and abdominal pain Pain Descriptors / Indicators: Discomfort, Constant, Cramping Pain Intervention(s): Limited activity within patient's tolerance, Monitored during session         Frequency  Min 1X/week        Progress Toward Goals  OT Goals(current goals can now be found in the care plan section)  Progress towards OT goals: OT to reassess next treatment     Plan Discharge plan remains appropriate       AM-PAC OT "6 Clicks" Daily Activity     Outcome Measure   Help from another person eating meals?: A Little Help from another person taking care of personal grooming?: A Lot Help from another person toileting, which includes using toliet, bedpan, or urinal?: Total Help from another person bathing (including washing, rinsing, drying)?: Total Help from another person to put on and taking off regular upper body clothing?: Total Help from another person to put on and  taking off regular lower body clothing?: Total 6 Click Score: 9    End of Session Equipment Utilized During Treatment: Other (comment) (sling)  OT Visit Diagnosis: Muscle weakness (generalized) (M62.81) Pain - Right/Left: Right Pain - part of body: Shoulder   Activity Tolerance Patient limited by pain;Patient limited by fatigue   Patient Left in bed;with call bell/phone within reach;with bed alarm set;with family/visitor present   Nurse Communication Mobility status        Time: 1350-1437 OT Time Calculation (min): 47 min  Charges: OT General Charges $OT Visit: 1 Visit OT Treatments $Self Care/Home Management : 38-52 mins  Rosalio Loud, MS Acute Rehabilitation Department Office# [phone]   Selinda Flavin 07/13/2023, 3:30 PM

## 2023-07-13 NOTE — Progress Notes (Signed)
PROGRESS NOTE Tricia Harrison  DGU:[id number] DOB: 04-30-1946 DOA: 07/04/2023 PCP: Eloisa Northern, MD  Brief Narrative/Hospital Course: 77 year old with history of chronic anemia, chronic back pain, chronic constipation, CKD stage IIIb, hypertension and hyperlipidemia, hypothyroidism, dementia, history of pulmonary embolism currently on apixaban who recently underwent right shoulder replacement, significant postoperative delirium and confusion and was ultimately sent to skilled nursing rehab.  Patient was sent back from the facility with decreased oral intake, nausea and emesis as well as diarrhea along with stool mixed with blood after using laxatives.  She was also more confused than usual.  In the emergency room hemoglobin 8.9 at about baseline.  Creatinine 2.7 with baseline creatinine of 1.5-1.6.  Chest x-ray low lung volumes without evidence of acute cardiovascular disease.  99% on room air.  Found significantly dehydrated, complaint of diarrhea.  Admitted due to significant findings. Recurrent urinary retention, Foley catheter placed 8/13 with 1300 mL urine output followed by hemorrhagic urine. CT scan with bilateral hydronephrosis due to urinary retention, rectum full of stool.  Flexible sigmoidoscopy with no mass, stool evacuated.  8/16, some clinical improvement  and hematuria is subsiding-started on Eliquis and monitored overnight.  Overnight hemoglobin remained stable vital stable.     Subjective: Patient seen and examined this morning Overnight afebrile BP stable  This morning hypoglycemic and 69, and was feeling generalized weakness-blood sugar improved with juice Foley remains in place with darkish urine and hemoglobin stable   Assessment and Plan: Principal Problem:   AKI (acute kidney injury) (HCC) Active Problems:   Essential hypertension   Hypothyroidism   Hyperlipidemia LDL goal <100   Hernia, hiatal   GERD (gastroesophageal reflux disease)   Type 2 diabetes mellitus (HCC)    Obstructive sleep apnea   Atherosclerosis of aorta (HCC)   Chronic kidney disease, stage 3b (HCC)   Thrombocytosis   Chronic constipation   Acute on chronic blood loss anemia   Grade I diastolic dysfunction   Acute kidney injury (AKI) with acute tubular necrosis (ATN) (HCC)   Rectal bleeding   Abnormal CT scan, colon   AKI on CKD 3b : Historical baseline creatinine 1.5.  Presented with creatinine 2.7. Found to have significant retention with hydronephrosis - responded to Foley catheter placement. Also with poor appetite poor oral intake, on valsartan at home contributing. Creatinine has normalized 1.0.  Continue to encourage oral intake, continue Foley catheter until adequate mobility and the TOV as op and will need to discharge with Foley catheter Recent Labs    06/04/23 1051 06/18/23 0859 07/04/23 1511 07/05/23 0842 07/06/23 0731 07/07/23 0629 07/08/23 0740 07/10/23 0551 07/11/23 0521 07/12/23 0623  BUN 32* 30* 77* 68* 61* 57* 56* 40* 31* 29*  CREATININE 1.49* 1.53* 2.70* 2.18* 1.92* 1.93* 1.98* 1.35* 1.02* 1.00  CO2 24 21* 18* 16* 16* 15* 15* 14* 15* 16*    Hematuria Acute blood loss anemia with hematuria in the setting of chronic anemia: Suspected secondary to decompression of bladder in a patient who is taking Eliquis.  Urine appears dark but has been like this per patient.Eliquis started back on 8/15, and serial hemoglobin has been stable and actually uptrending. She had received 1 L PRBC this admission. Recent Labs    07/08/23 0740 07/10/23 0551 07/11/23 0521 07/12/23 0623 07/13/23 0550  HGB 8.0* 7.3* 9.3* 9.2* 9.7*  MCV 98.9 99.2 95.8 95.1 98.1    Acute UTI, hemorrhagic cystitis, urinary retention: Urine culture with multiple species.  Continue oral Keflex complete the course  Chronic constipation now with diarrhea: With evidence of stool in the rectal vault.  Patient reports no BM last BM was 8/16 continue MiraLAX PRN. DULCOLAX IF NEED Rectal bleeding has  stopped. C. difficile and GI pathogen panel is negative.   Type 2 diabetes, with hypoglycemia: diet controlled.  On carb modified diet> given hypoglycemic changed to regular diet.  She is not on insulin or OSA  Essential hypertension Dehydrated: BP stable cont coreg  Hypothyroidism: Cont synthroid.  GERD: Cont Prilosec.   History of pulmonary embolism on Eliquis:  Cont on Eliquis.  Hemoglobin stable and uptrending   Cognitive decline: Patient will need formal neurocognitive evaluation.  Family requested neurology consult.  I have made ambulatory referral to [facility] neurology to be seen as a routine consult outpatient.   Recent right shoulder surgery, difficult recovery: Cont PT OT.  Anticipate back to SNF. Continue to mobilize.  Speech following.  On dysphagia diet.  Class 1 Obesity:Patient's Body mass index is 32.91 kg/m. : Will benefit with PCP follow-up, weight loss  healthy lifestyle and outpatient sleep evaluation.   DVT prophylaxis: SCDs Start: 07/04/23 1731 Code Status:   Code Status: Full Code Family Communication: plan of care discussed with patient/no family at bedside. Patient status is: Inpatient because of the weakness and hematuria Level of care: Med-Surg   Dispo: The patient is from: SNF            Anticipated disposition: Anticipate discharge to skilled nursing facility if no more hypoglycemia.  Check to [facility] today -unable to get into touch with the facility to accept today.  Objective: Vitals last 24 hrs: Vitals:   07/12/23 0513 07/12/23 1346 07/12/23 2027 07/13/23 0541  BP: (!) 142/68 (!) 151/96 129/76 (!) 126/59  Pulse: 68 94 74 (!) 57  Resp: 14 18 14 14   Temp: 98.1 F (36.7 C) 98.4 F (36.9 C) 98.6 F (37 C) 98.1 F (36.7 C)  TempSrc: Oral  Oral Oral  SpO2: 99% 98% 100% 100%  Weight:      Height:       Weight change:   Physical Examination: General exam: alert awake, obese, oriented at baseline HEENT:Oral mucosa moist, Ear/Nose WNL  grossly Respiratory system: Bilaterally clear BS,no use of accessory muscle Cardiovascular system: S1 & S2 +, No JVD. Gastrointestinal system: Abdomen soft,NT,ND, BS+ Nervous System: Alert, awake, moving all extremities,and following commands. Extremities: LE edema neg,distal peripheral pulses palpable and warm.  Skin: No rashes,no icterus. MSK: Normal muscle bulk,tone, power  Foley+  Medications reviewed:  Scheduled Meds:  apixaban  5 mg Oral BID   carvedilol  6.25 mg Oral Daily   cephALEXin  500 mg Oral Q8H   Chlorhexidine Gluconate Cloth  6 each Topical Daily   ketorolac  1 drop Both Eyes QID   latanoprost  1 drop Both Eyes QHS   levothyroxine  50 mcg Oral Q0600   pantoprazole  40 mg Oral Daily   polyethylene glycol  17 g Oral Daily   QUEtiapine  25 mg Oral QHS   rosuvastatin  20 mg Oral QPM   Continuous Infusions:  Diet Order             DIET DYS 3 Room service appropriate? Yes with Assist; Fluid consistency: Thin  Diet effective now                   Intake/Output Summary (Last 24 hours) at 07/13/2023 0827 Last data filed at 07/13/2023 0600 Gross per 24 hour  Intake  480 ml  Output 1125 ml  Net -645 ml   Net IO Since Admission: 5,815.32 mL [07/13/23 0827]  Wt Readings from Last 3 Encounters:  07/07/23 79 kg  06/17/23 70.3 kg  03/13/23 73.8 kg     Unresulted Labs (From admission, onward)    None     Data Reviewed: I have personally reviewed following labs and imaging studies CBC: Recent Labs  Lab 07/07/23 0629 07/08/23 0740 07/10/23 0551 07/11/23 0521 07/12/23 0623 07/13/23 0550  WBC 11.1* 10.4 7.1 7.9 7.1 7.5  NEUTROABS 7.0 7.2 4.6 4.7 5.2  --   HGB 8.3* 8.0* 7.3* 9.3* 9.2* 9.7*  HCT 25.5* 26.4* 23.7* 29.6* 29.1* 31.1*  MCV 94.8 98.9 99.2 95.8 95.1 98.1  PLT 432* 379 302 272 274 276   Basic Metabolic Panel: Recent Labs  Lab 07/07/23 0629 07/08/23 0740 07/10/23 0551 07/11/23 0521 07/12/23 0623  NA 136 136 136 137 138  K 3.7 3.7 3.5  3.6 3.7  CL 113* 112* 116* 115* 117*  CO2 15* 15* 14* 15* 16*  GLUCOSE 97 83 78 72 81  BUN 57* 56* 40* 31* 29*  CREATININE 1.93* 1.98* 1.35* 1.02* 1.00  CALCIUM 7.7* 7.9* 7.4* 7.7* 7.8*  MG 1.8  --   --  1.6*  --    CBG: Recent Labs  Lab 07/12/23 1112 07/12/23 1634 07/12/23 2028 07/13/23 0741 07/13/23 0811  GLUCAP 99 91 78 69* 87   Recent Results (from the past 240 hour(s))  C Difficile Quick Screen w PCR reflex     Status: None   Collection Time: 07/05/23 12:30 PM   Specimen: Rectum; Stool  Result Value Ref Range Status   C Diff antigen NEGATIVE NEGATIVE Final   C Diff toxin NEGATIVE NEGATIVE Final   C Diff interpretation No C. difficile detected.  Final    Comment: Performed at [facility], [address] [place], [place] [address]  Gastrointestinal Panel by PCR , Stool     Status: None   Collection Time: 07/05/23 12:30 PM   Specimen: Rectum; Stool  Result Value Ref Range Status   Campylobacter species NOT DETECTED NOT DETECTED Final   Plesimonas shigelloides NOT DETECTED NOT DETECTED Final   Salmonella species NOT DETECTED NOT DETECTED Final   Yersinia enterocolitica NOT DETECTED NOT DETECTED Final   Vibrio species NOT DETECTED NOT DETECTED Final   Vibrio cholerae NOT DETECTED NOT DETECTED Final   Enteroaggregative E coli (EAEC) NOT DETECTED NOT DETECTED Final   Enteropathogenic E coli (EPEC) NOT DETECTED NOT DETECTED Final   Enterotoxigenic E coli (ETEC) NOT DETECTED NOT DETECTED Final   Shiga like toxin producing E coli (STEC) NOT DETECTED NOT DETECTED Final   Shigella/Enteroinvasive E coli (EIEC) NOT DETECTED NOT DETECTED Final   Cryptosporidium NOT DETECTED NOT DETECTED Final   Cyclospora cayetanensis NOT DETECTED NOT DETECTED Final   Entamoeba histolytica NOT DETECTED NOT DETECTED Final   Giardia lamblia NOT DETECTED NOT DETECTED Final   Adenovirus F40/41 NOT DETECTED NOT DETECTED Final   Astrovirus NOT DETECTED NOT DETECTED Final    Norovirus GI/GII NOT DETECTED NOT DETECTED Final   Rotavirus A NOT DETECTED NOT DETECTED Final   Sapovirus (I, II, IV, and V) NOT DETECTED NOT DETECTED Final    Comment: Performed at [facility], [address] [place], [place] [address]  Culture, [place] Urine     Status: Abnormal   Collection Time: 07/05/23  3:00 PM   Specimen: Urine, Catheterized  Result  Value Ref Range Status   Specimen Description   Final    URINE, CATHETERIZED Performed at [facility], [address], [place], [place] [address]    Special Requests   Final    URINE, RANDOM Performed at [facility], [address], [place], [place] [address]    Culture (A)  Final    MULTIPLE SPECIES PRESENT, SUGGEST RECOLLECTION NO GROUP B STREP (S.AGALACTIAE) ISOLATED Performed at [facility], [address], [place], [place] [address]    Report Status 07/07/2023 FINAL  Final  Urine Culture (for pregnant, neutropenic or urologic patients or patients with an indwelling urinary catheter)     Status: Abnormal   Collection Time: 07/05/23  3:00 PM   Specimen: Urine, Clean Catch  Result Value Ref Range Status   Specimen Description   Final    URINE, CLEAN CATCH Performed at [facility], [address], [place], [place] [address]    Special Requests   Final    NONE Performed at [facility], [address], [place], [place] [address]    Culture MULTIPLE SPECIES PRESENT, SUGGEST RECOLLECTION (A)  Final   Report Status 07/07/2023 FINAL  Final    Antimicrobials: Anti-infectives (From admission, onward)    Start     Dose/Rate Route Frequency Ordered Stop   07/10/23 1400  cephALEXin (KEFLEX) capsule 500 mg        500 mg Oral Every 8 hours 07/10/23 1258 07/15/23 1359   07/06/23 0800  cefTRIAXone (ROCEPHIN) 1 g in sodium chloride 0.9 % 100 mL IVPB  Status:  Discontinued        1 g 200 mL/hr over 30 Minutes Intravenous Every 24 hours  07/06/23 0728 07/10/23 1258      Culture/Microbiology    Component Value Date/Time   SDES  07/05/2023 1500    URINE, CATHETERIZED Performed at [facility], [address] [place], [place] [address]    SDES  07/05/2023 1500    URINE, CLEAN CATCH Performed at [facility], [address], [place], [place] [address]    SPECREQUEST  07/05/2023 1500    URINE, RANDOM Performed at [facility], [address], [place], [place] [address]    SPECREQUEST  07/05/2023 1500    NONE Performed at [facility], [address], [place], [place] [address]    CULT (A) 07/05/2023 1500    MULTIPLE SPECIES PRESENT, SUGGEST RECOLLECTION NO GROUP B STREP (S.AGALACTIAE) ISOLATED Performed at [facility], [address] [place], [place] [address]    CULT MULTIPLE SPECIES PRESENT, SUGGEST RECOLLECTION (A) 07/05/2023 1500   REPTSTATUS 07/07/2023 FINAL 07/05/2023 1500   REPTSTATUS 07/07/2023 FINAL 07/05/2023 1500   Radiology Studies: No results found.   LOS: 8 days   Lanae Boast, MD [facility]  07/13/2023, 8:27 AM

## 2023-07-13 NOTE — TOC Progression Note (Signed)
Transition of Care [facility]) - Progression Note    Patient Details  Name: Tricia Harrison MRN: [id number] Date of Birth: December 13, 1945  [facility]) CM/SW Contact  Larrie Kass, LCSW Phone Number: 07/13/2023, 9:23 AM  Clinical Narrative:    Pt's insurance Berkley Harvey was approved for [employer]. Attempted to call [place] admission with [facility] , no answer Left HIPAA compliant VM requesting return call.  CSW called main number no response left VM. TOC to follow.    Expected Discharge Plan: Skilled Nursing Facility Barriers to Discharge: Continued Medical Work up  Expected Discharge Plan and Services In-house Referral: NA Discharge Planning Services: CM Consult Post Acute Care Choice: Skilled Nursing Facility Living arrangements for the past 2 months: Single Family Home                 DME Arranged: N/A         HH Arranged: NA HH Agency: NA         Social Determinants of Health (SDOH) Interventions SDOH Screenings   Food Insecurity: No Food Insecurity (07/05/2023)  Housing: Low Risk  (07/05/2023)  Transportation Needs: No Transportation Needs (07/05/2023)  Utilities: Not At Risk (07/05/2023)  Depression (PHQ2-9): Low Risk  (11/09/2018)  Tobacco Use: Medium Risk (07/09/2023)    Readmission Risk Interventions    07/08/2023    4:14 PM  Readmission Risk Prevention Plan  Transportation Screening Complete  PCP or Specialist Appt within 5-7 Days Complete  Home Care Screening Complete  Medication Review (RN CM) Complete

## 2023-07-13 NOTE — Plan of Care (Signed)
  Problem: Education: °Goal: Knowledge of the prescribed therapeutic regimen will improve °Outcome: Progressing °Goal: Understanding of activity limitations/precautions following surgery will improve °Outcome: Progressing °Goal: Individualized Educational Video(s) °Outcome: Progressing °  °

## 2023-07-14 ENCOUNTER — Encounter (HOSPITAL_COMMUNITY): Payer: Self-pay | Admitting: Gastroenterology

## 2023-07-14 DIAGNOSIS — N179 Acute kidney failure, unspecified: Secondary | ICD-10-CM | POA: Diagnosis not present

## 2023-07-14 LAB — GLUCOSE, CAPILLARY
Glucose-Capillary: 91 mg/dL (ref 70–99)
Glucose-Capillary: 89 mg/dL (ref 70–99)
Glucose-Capillary: 95 mg/dL (ref 70–99)
Glucose-Capillary: 101 mg/dL — ABNORMAL HIGH (ref 70–99)

## 2023-07-14 MED ORDER — ENSURE ENLIVE PO LIQD
237.0000 mL | Freq: Two times a day (BID) | ORAL | Status: DC
  Administered 2023-07-15 – 2023-07-16 (×4): 237 mL via ORAL

## 2023-07-14 NOTE — Progress Notes (Signed)
PROGRESS NOTE    Tricia Harrison  ZOX:[id number] DOB: January 31, 1946 DOA: 07/04/2023 PCP: Eloisa Northern, MD    Brief Narrative:  77 year old with history of chronic anemia, chronic back pain, chronic constipation, CKD stage IIIb, hypertension and hyperlipidemia, hypothyroidism, dementia, history of pulmonary embolism currently on apixaban who recently underwent right shoulder replacement, significant postoperative delirium and confusion and was ultimately sent to skilled nursing rehab.  Patient was sent back from the facility with decreased oral intake, nausea and emesis as well as diarrhea along with stool mixed with blood after using laxatives.  She was also more confused than usual.  In the emergency room hemoglobin 8.9 at about baseline.  Creatinine 2.7 with baseline creatinine of 1.5-1.6.  Chest x-ray low lung volumes without evidence of acute cardiovascular disease.  99% on room air.  Found significantly dehydrated, complaint of diarrhea.  Admitted due to significant findings. Recurrent urinary retention, Foley catheter placed 8/13 with 1300 mL urine output followed by hemorrhagic urine. CT scan with bilateral hydronephrosis due to urinary retention, rectum full of stool.  Flexible sigmoidoscopy with no mass, stool evacuated. Remains in the hospital with hematuria, unable to tolerate anticoagulation. 8/19, hematuria recurred after 2 days on Eliquis.  Kidney functions are stable.   Assessment & Plan:   Acute kidney injury with chronic kidney disease stage IIIb: Historical baseline creatinine 1.5.  Presented with creatinine 2.7.  Did not respond to IV fluids, responded to Foley catheter placement.   Renal function has further improved and normalized. Prerenal with poor appetite, poor oral intake.  Valsartan use. Found to have significant urine retention with hydronephrosis, Foley catheter with 1300 mL retention. Tolerating liquid intake. Continues to have hematuria and unable to tolerate  Eliquis challenge. Hold Eliquis today.  Urology consult.  May need cystoscopy to look for any bleeding lesions.  Hematuria: Suspected secondary to decompression of bladder in a patient who is taking Eliquis.  Recurrent hematuria after restarting on Eliquis. Received 1 unit of PRBC and has remained stable since then.  Acute on chronic anemia, anemia of acute blood loss due to hematuria:  Hemoglobin 7.3.  1 unit PRBC transfusion.  Appropriate response.    Acute UTI, hemorrhagic cystitis, urinary retention: Urine culture with multiple species.  Treated as complicated UTI.  Completed antibiotic therapy.  Chronic constipation now with diarrhea: With evidence of stool in the rectal vault.  Flex sig.  Continue bowel regimen.  Avoid constipation. Rectal bleeding has stopped. C. difficile and GI pathogen panel is negative.  Type 2 diabetes, diet controlled.  On carb modified diet.  Episode of hypoglycemia.  Allow regular diet.  Essential hypertension, dehydrated.  Holding antihypertensives.  Hypothyroidism, Synthroid.  Continue.  GERD, on Prilosec.  History of pulmonary embolism on Eliquis: Last pulmonary embolism and DVT in 2019.  Will discontinue Eliquis if unable to control bleeding.  Cognitive decline: Patient will need formal neurocognitive evaluation.  Family requested neurology consult.  made ambulatory referral to [facility] neurology to be seen as a routine consult outpatient.  Recent right shoulder surgery: Working with PT OT.  Anticipate back to SNF.   DVT prophylaxis: SCDs Start: 07/04/23 1731   Code Status: Full code Family Communication: Patient's son and daughter-in-law over the phone. Disposition Plan: Status is: Inpatient.  Monitoring for recurrent hematuria.   Consultants:  GI Urology  Procedures:  Flex sig  Antimicrobials:  Rocephin 8/11--- 8/15 Keflex 8/15---8/19   Subjective:  Patient was seen and examined.  No overnight events.  Still has some lower  abdominal discomfort if someone pushes on it.  Urine is more darker today with evidence of some fresh blood.  No evidence of blood clot or clogging of the tube.  Denies any nausea vomiting.  Appetite is not great but she is trying.  Objective: Vitals:   07/13/23 2051 07/14/23 0528 07/14/23 0955 07/14/23 1307  BP: 110/64 (!) 108/59 119/71 (!) 140/77  Pulse: 72 62 (!) 102 71  Resp: 14 14 16 17   Temp: 98.1 F (36.7 C) 98.3 F (36.8 C) 98.7 F (37.1 C) 97.7 F (36.5 C)  TempSrc: Oral Oral Oral Oral  SpO2: 100% 99% 100% 98%  Weight:      Height:        Intake/Output Summary (Last 24 hours) at 07/14/2023 1624 Last data filed at 07/14/2023 1300 Gross per 24 hour  Intake 474 ml  Output 225 ml  Net 249 ml   Filed Weights   07/07/23 1009  Weight: 79 kg    Examination:  General: Comfortable.  Interactive.  Frail and debilitated. Cardiovascular: S1-S2 normal.  Regular rate rhythm. Respiratory: Bilateral clear.  No added sounds. Gastrointestinal: Soft.  Nontender.  Pendulous.  No rigidity or guarding. Foley catheter pink-tinged urine.  Darker urine in the Foley bag. Ext: Trace bilateral edema.   Data Reviewed: I have personally reviewed following labs and imaging studies  CBC: Recent Labs  Lab 07/08/23 0740 07/10/23 0551 07/11/23 0521 07/12/23 0623 07/13/23 0550  WBC 10.4 7.1 7.9 7.1 7.5  NEUTROABS 7.2 4.6 4.7 5.2  --   HGB 8.0* 7.3* 9.3* 9.2* 9.7*  HCT 26.4* 23.7* 29.6* 29.1* 31.1*  MCV 98.9 99.2 95.8 95.1 98.1  PLT 379 302 272 274 276   Basic Metabolic Panel: Recent Labs  Lab 07/08/23 0740 07/10/23 0551 07/11/23 0521 07/12/23 0623  NA 136 136 137 138  K 3.7 3.5 3.6 3.7  CL 112* 116* 115* 117*  CO2 15* 14* 15* 16*  GLUCOSE 83 78 72 81  BUN 56* 40* 31* 29*  CREATININE 1.98* 1.35* 1.02* 1.00  CALCIUM 7.9* 7.4* 7.7* 7.8*  MG  --   --  1.6*  --    GFR: Estimated Creatinine Clearance: 44.8 mL/min (by C-G formula based on SCr of 1 mg/dL). Liver Function  Tests: No results for input(s): "AST", "ALT", "ALKPHOS", "BILITOT", "PROT", "ALBUMIN" in the last 168 hours.  No results for input(s): "LIPASE", "AMYLASE" in the last 168 hours. No results for input(s): "AMMONIA" in the last 168 hours. Coagulation Profile: No results for input(s): "INR", "PROTIME" in the last 168 hours. Cardiac Enzymes: No results for input(s): "CKTOTAL", "CKMB", "CKMBINDEX", "TROPONINI" in the last 168 hours.  BNP (last 3 results) No results for input(s): "PROBNP" in the last 8760 hours. HbA1C: No results for input(s): "HGBA1C" in the last 72 hours. CBG: Recent Labs  Lab 07/13/23 1727 07/13/23 2052 07/14/23 0742 07/14/23 1139 07/14/23 1613  GLUCAP 71 93 91 89 95   Lipid Profile: No results for input(s): "CHOL", "HDL", "LDLCALC", "TRIG", "CHOLHDL", "LDLDIRECT" in the last 72 hours. Thyroid Function Tests: No results for input(s): "TSH", "T4TOTAL", "FREET4", "T3FREE", "THYROIDAB" in the last 72 hours. Anemia Panel: No results for input(s): "VITAMINB12", "FOLATE", "FERRITIN", "TIBC", "IRON", "RETICCTPCT" in the last 72 hours. Sepsis Labs: No results for input(s): "PROCALCITON", "LATICACIDVEN" in the last 168 hours.  Recent Results (from the past 240 hour(s))  C Difficile Quick Screen w PCR reflex     Status: None   Collection Time: 07/05/23 12:30 PM   Specimen:  Rectum; Stool  Result Value Ref Range Status   C Diff antigen NEGATIVE NEGATIVE Final   C Diff toxin NEGATIVE NEGATIVE Final   C Diff interpretation No C. difficile detected.  Final    Comment: Performed at [facility], [address] [place], [place] [address]  Gastrointestinal Panel by PCR , Stool     Status: None   Collection Time: 07/05/23 12:30 PM   Specimen: Rectum; Stool  Result Value Ref Range Status   Campylobacter species NOT DETECTED NOT DETECTED Final   Plesimonas shigelloides NOT DETECTED NOT DETECTED Final   Salmonella species NOT DETECTED NOT DETECTED Final    Yersinia enterocolitica NOT DETECTED NOT DETECTED Final   Vibrio species NOT DETECTED NOT DETECTED Final   Vibrio cholerae NOT DETECTED NOT DETECTED Final   Enteroaggregative E coli (EAEC) NOT DETECTED NOT DETECTED Final   Enteropathogenic E coli (EPEC) NOT DETECTED NOT DETECTED Final   Enterotoxigenic E coli (ETEC) NOT DETECTED NOT DETECTED Final   Shiga like toxin producing E coli (STEC) NOT DETECTED NOT DETECTED Final   Shigella/Enteroinvasive E coli (EIEC) NOT DETECTED NOT DETECTED Final   Cryptosporidium NOT DETECTED NOT DETECTED Final   Cyclospora cayetanensis NOT DETECTED NOT DETECTED Final   Entamoeba histolytica NOT DETECTED NOT DETECTED Final   Giardia lamblia NOT DETECTED NOT DETECTED Final   Adenovirus F40/41 NOT DETECTED NOT DETECTED Final   Astrovirus NOT DETECTED NOT DETECTED Final   Norovirus GI/GII NOT DETECTED NOT DETECTED Final   Rotavirus A NOT DETECTED NOT DETECTED Final   Sapovirus (I, II, IV, and V) NOT DETECTED NOT DETECTED Final    Comment: Performed at [facility], [address] [place], [place] [address]  Culture, [place] Urine     Status: Abnormal   Collection Time: 07/05/23  3:00 PM   Specimen: Urine, Catheterized  Result Value Ref Range Status   Specimen Description   Final    URINE, CATHETERIZED Performed at [facility], [address], [place], [place] [address]    Special Requests   Final    URINE, RANDOM Performed at [facility], [address], [place], [place] [address]    Culture (A)  Final    MULTIPLE SPECIES PRESENT, SUGGEST RECOLLECTION NO GROUP B STREP (S.AGALACTIAE) ISOLATED Performed at [facility], [address], [place], [place] [address]    Report Status 07/07/2023 FINAL  Final  Urine Culture (for pregnant, neutropenic or urologic patients or patients with an indwelling urinary catheter)     Status: Abnormal   Collection Time: 07/05/23  3:00 PM   Specimen: Urine, Clean Catch   Result Value Ref Range Status   Specimen Description   Final    URINE, CLEAN CATCH Performed at [facility], [address], [place], [place] [address]    Special Requests   Final    NONE Performed at [facility], [address], [place], [place] [address]    Culture MULTIPLE SPECIES PRESENT, SUGGEST RECOLLECTION (A)  Final   Report Status 07/07/2023 FINAL  Final         Radiology Studies: No results found.      Scheduled Meds:  carvedilol  6.25 mg Oral Daily   cephALEXin  500 mg Oral Q8H   Chlorhexidine Gluconate Cloth  6 each Topical Daily   cholecalciferol  1,000 Units Oral Daily   ketorolac  1 drop Both Eyes QID   latanoprost  1 drop  Both Eyes QHS   levothyroxine  50 mcg Oral Q0600   pantoprazole  40 mg Oral Daily   polyethylene glycol  17 g Oral Daily   QUEtiapine  25 mg Oral QHS   rosuvastatin  20 mg Oral QPM   Continuous Infusions:     LOS: 9 days    Time spent: 35 minutes    Dorcas Carrow, MD [facility]

## 2023-07-14 NOTE — Progress Notes (Signed)
Physical Therapy Treatment Patient Details Name: Tricia Harrison MRN: [id number] DOB: Feb 02, 1946 Today's Date: 07/14/2023   History of Present Illness Patient is a 77 year old female sent from her rehab facility due to decreased oral intake, several episodes of nausea and emesis earlier this week.  She has also been having hematochezia at the SNF. Pt recently s/p reverse R  TSA on 06/17/23 and discharged to SNF.  PMH: DVT,CAD,CKD, HTN, LTKA, DM, PE    PT Comments   Pt admitted with above diagnosis.  Pt currently with functional limitations due to the deficits listed below (see PT Problem List). Pt in bed when PT and rehab tech arrived. Pt required encouragement for participation with therapy. Pt demonstrated minimal to no initiation of movement with L UE and B LE to engage with bed mobility requiring extensive assist, pt able to maintain assisted sitting EOB for 3:24 with min to mod A and able to maintain unsupported unassisted sitting EOB for 2 trails < 10s with pt exhibiting posterior and L lateral lean and pt is unable to implement self recovery strategies requiring max A to return to upright sitting at midline. Pt indicated that it hurt her back to sit up. Pt reported that she did not want to do anything that was going to hurt her back. Pt stated she thinks that her back is worse because she has been in bed. Pt ed provided on importance of core stability and maintaining sitting balance prior to progression for standing balance and transfer tasks and that if pt was motivated to get OOB at this time nursing staff could assist with the total or sky lift. Pt demonstrated verbal understanding. Pt's granddaughter arrived once pt had been assisted to supine. Granddaughter indicated pt is to transition to SNF today or tomorrow. Pt d/c plan remains appropriate at this time. Pt left in bed all needs in place, positioned with pillows and granddaughter to help pt set up for lunch. Pt will benefit from acute  skilled PT to increase their independence and safety with mobility to allow discharge.      If plan is discharge home, recommend the following: Two people to help with walking and/or transfers;Assistance with cooking/housework;Assist for transportation;Help with stairs or ramp for entrance;A lot of help with bathing/dressing/bathroom   Can travel by private vehicle     No  Equipment Recommendations  None recommended by PT    Recommendations for Other Services       Precautions / Restrictions Precautions Precautions: Fall;Shoulder Type of Shoulder Precautions: no ROM shoulder, OK for hand wrist and elbow ROM, per phone call on 7/24 with Dr.Landau patient is ok to use RUE on rollator for transfers and functional mobility. Shoulder Interventions: Shoulder sling/immobilizer;At all times;Off for dressing/bathing/exercises Precaution Booklet Issued: Yes (comment) Precaution Comments: no, flexion/ABD right shoulder, may place hand on rollator and bear weight to RUE to transfer (all per previous admission about 2 weeks ago) Required Braces or Orthoses: Sling Restrictions Weight Bearing Restrictions: Yes RUE Weight Bearing: Non weight bearing Other Position/Activity Restrictions: OK to WB on RW for transfers     Mobility  Bed Mobility Overal bed mobility: Needs Assistance Bed Mobility: Rolling, Supine to Sit, Sit to Supine Rolling: Max assist   Supine to sit: Total assist, HOB elevated, Used rails Sit to supine: HOB elevated, Total assist, Used rails   General bed mobility comments: increased time, hand over hand A to place L UE at hand rail,  required max A for scooting in  sitting to EOB and  total assist for scooting to the head of the bed, absent initiation of movement for B LE  for supine <> sit    Transfers                   General transfer comment: NT for pt and staff safety    Ambulation/Gait               General Gait Details: NT for pt and staff  safety   Stairs             Wheelchair Mobility     Tilt Bed    Modified Rankin (Stroke Patients Only)       Balance Overall balance assessment: Needs assistance, History of Falls Sitting-balance support: Feet supported, Single extremity supported Sitting balance-Leahy Scale: Poor Sitting balance - Comments: leaning to the L side and posteriorly Postural control: Right lateral lean Standing balance support: Bilateral upper extremity supported, During functional activity, Reliant on assistive device for balance Standing balance-Leahy Scale: Poor Standing balance comment: NT                            Cognition Arousal: Alert Behavior During Therapy: Flat affect Overall Cognitive Status: Difficult to assess Area of Impairment: Following commands, Attention                     Memory: Decreased short-term memory   Safety/Judgement: Decreased awareness of deficits, Decreased awareness of safety     General Comments: patient having difficult time with processing her need for intiation of tasks. patient presents with learned helplessness and family is visably frustrated with this during session. patient was unable to carryover education on ways to participate and stay positive with multiple repeate educations during session.        Exercises      General Comments        Pertinent Vitals/Pain Pain Assessment Pain Assessment: Faces Faces Pain Scale: Hurts whole lot Pain Location: back and abdominal pain Pain Descriptors / Indicators: Discomfort, Constant, Cramping Pain Intervention(s): Limited activity within patient's tolerance, Monitored during session    Home Living Family/patient expects to be discharged to:: Skilled nursing facility Living Arrangements: Other (Comment) Available Help at Discharge: Family Type of Home: House Home Access: Stairs to enter   Entrance Stairs-Number of Steps: 1   Home Layout: Two level;Able to live on  main level with bedroom/bathroom Home Equipment: Shower seat;Rolling Walker (2 wheels);Cane - single point;BSC/3in1 Additional Comments: bedside commode is over toilet; she also has another one in her room    Prior Function            PT Goals (current goals can now be found in the care plan section) Acute Rehab PT Goals Patient Stated Goal: to return home PT Goal Formulation: With patient/family Time For Goal Achievement: 07/19/23 Potential to Achieve Goals: Fair    Frequency    Min 1X/week      PT Plan Current plan remains appropriate    Co-evaluation              AM-PAC PT "6 Clicks" Mobility   Outcome Measure  Help needed turning from your back to your side while in a flat bed without using bedrails?: Total Help needed moving from lying on your back to sitting on the side of a flat bed without using bedrails?: Total Help needed moving to and from a bed  to a chair (including a wheelchair)?: Total Help needed standing up from a chair using your arms (e.g., wheelchair or bedside chair)?: Total Help needed to walk in hospital room?: Total Help needed climbing 3-5 steps with a railing? : Total 6 Click Score: 6    End of Session   Activity Tolerance: Patient limited by pain;Patient limited by fatigue Patient left: in bed;with call bell/phone within reach;with bed alarm set;with family/visitor present Nurse Communication: Need for lift equipment;Mobility status;Weight bearing status PT Visit Diagnosis: Muscle weakness (generalized) (M62.81);Difficulty in walking, not elsewhere classified (R26.2)     Time: 1610-9604 PT Time Calculation (min) (ACUTE ONLY): 18 min  Charges:    $Therapeutic Activity: 8-22 mins PT General Charges $$ ACUTE PT VISIT: 1 Visit                     Johnny Bridge, PT Acute Rehab    Jacqualyn Posey 07/14/2023, 2:17 PM

## 2023-07-14 NOTE — Consult Note (Signed)
Urology Consult Note   Requesting Attending Physician:  Dorcas Carrow, MD Service Providing Consult: Urology  Consulting Attending: Dr. Liliane Shi   Reason for Consult:  hematuria  HPI: Tricia Harrison is seen in consultation for reasons noted above at the request of Dorcas Carrow, MD  ------------------  Assessment:  77 y.o. female sent to [facility] emergency department from her rehabilitation facility due to N/V and decreased oral intake over the last week.  She has been in hospital for the last 10 days.  Per her primary doctor she was in urinary retention, Foley catheter was placed, and 1300 cc was returned.  She began to have hematuria which would resolve when Eliquis was held and return whenever Eliquis challenge was restarted.  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  They do not wish to stop her Eliquis for longer than 72 hours.  [facility] has been consulted to assess for possible cystoscopy with fulguration.  Recommendations: #Hematuria Last dose of Eliquis was this am. Hematuria improving per granddaughter. Hold as long as medically reasonable Encourage fluids Light red, no clot accumulation Hgb improved from 9.2-->9.5 yesterday. Trend H&H If not improved tomorrow will place three-way hematuria catheter in the morning and initiate CBI.  Pharmacy has enough alum in stock to compound approximately 3 bags which may last one shift.  Will reevaluate necessity tomorrow. Will hopefully only need conventional CBI.  Cystoscopy on outpatient basis. CT hematuria should be collected. Since she had CT A/P without yesterday, we can just order the contrasted A/P  #bilateral hydronephrosis Foley for 5-7d for stretch injury Would usually re-image with [place], but CT tomorrow will reveal how well she is responding to decompression.   Will follow along   Case and plan discussed with Dr. Liliane Shi  Past Medical  History: Past Medical History:  Diagnosis Date   Anemia    as a child   Anginal pain (HCC)    Asthma    as a child   Bladder incontinence    Chronic back pain    spinal stenosis and buldging disc;scoliosis   Chronic constipation    occasionally takes something OTC   Chronic kidney disease    Diabetes mellitus without complication (HCC)    per pt "Pre" for 20+yrs   Diverticulosis    DVT (deep venous thrombosis) (HCC)    Dyspnea    occasional   Gallstones    has known about this for 3-13yrs   GERD (gastroesophageal reflux disease)    takes Omeprazole daily   Glaucoma    H/O hiatal hernia    Heart murmur    Hemorrhoids    History of blood transfusion    no abnormal reaction noted   History of bronchitis    states its been a long time ago   History of gout    HLD (hyperlipidemia)    takes Fenofibrate daily   HTN (hypertension)    takes Diltiazem and Ramipril daily   Hypothyroidism    takes Synthroid daily   Left knee DJD    Macular degeneration    Nocturia    Palpitations    started in 2013 and only occasionally;last time noticed about 2-3wks ago and after drinking caffeine   Peripheral vascular disease (HCC)    PONV (postoperative nausea and vomiting)    problems swallowing after anethesia- 2/2023and slurred speech also   Pulmonary embolism with acute cor pulmonale (HCC) 07/2018   Submassive  Bilateral PE - had Pulm HTN & + Troponin -- PA pressures now back to normal on Echo 10/2018.   Sleep apnea    does not uses cpap    Past Surgical History:  Past Surgical History:  Procedure Laterality Date   3-day EVENT MONITOR  01/2019   Mostly normal monitor.  Predominantly sinus rhythm (rate range 48-105 bpm, average 67 bpm) 9 short atrial runs (fastest = 13 beats rate 135 bpm, longest ~17 seconds-101 bpm) -> not noted on symptom log.  No sustained arrhythmias (tachycardia or bradycardia), or pauses..  Symptoms are noted with PVCs.   bilateral cataract surgery       CHOLECYSTECTOMY N/A 01/10/2022   Procedure: LAPAROSCOPIC CHOLECYSTECTOMY WITH INTRAOPERATIVE CHOLANGIOGRAM;  Surgeon: Karie Soda, MD;  Location: [facility]S;  Service: General;  Laterality: N/A;   COLONOSCOPY     CT ANGIOGRAM OF CHEST - PE PROTOCOL  07/2018   Bilateral nonocclusive pulmonary emboli evidence of right heart strain consistent with "submassive "/intermediate PE.  -->  Code PE called.   DILATION AND CURETTAGE OF UTERUS     multiple about 6-7 times   ESOPHAGOGASTRODUODENOSCOPY     FLEXIBLE SIGMOIDOSCOPY N/A 07/09/2023   Procedure: FLEXIBLE SIGMOIDOSCOPY;  Surgeon: Charlott Rakes, MD;  Location: [facility];  Service: Gastroenterology;  Laterality: N/A;   NM VQ LUNG SCAN ([facility] HX)  11/23/2018   Low probability for PE   REVERSE SHOULDER ARTHROPLASTY Right 06/17/2023   Procedure: RIGHT REVERSE SHOULDER ARTHROPLASTY;  Surgeon: Teryl Lucy, MD;  Location: [facility];  Service: Orthopedics;  Laterality: Right;   RIGHT/LEFT HEART CATH AND CORONARY ANGIOGRAPHY N/A 08/19/2018   Procedure: RIGHT/LEFT HEART CATH AND CORONARY ANGIOGRAPHY;  Surgeon: Corky Crafts, MD;  Location: [facility] INVASIVE CV LAB;  Service: Cardiovascular:: Nonobstructive CAD.  Mild pulmonary pretension.  Mean PA pressure 26 mmHg with PA P 48/20 mmHg.  Normal LVEDP.   THYROID SURGERY Right 2010   TOTAL HIP ARTHROPLASTY Left 2005   TOTAL KNEE ARTHROPLASTY Left 02/21/2014   Procedure: TOTAL KNEE ARTHROPLASTY;  Surgeon: Nilda Simmer, MD;  Location: [facility] OR;  Service: Orthopedics;  Laterality: Left;   TRANSTHORACIC ECHOCARDIOGRAM  07/2018    (In setting of acute PE) mild LVH.  EF 55 to 60%.  GR 1 DD.  Aortic sclerosis but no stenosis.  Mild regurgitation.  Mild RA dilation.  Moderate LA dilation.  Moderate tricuspid regurgitation with severe primary hypertension estimated PA pressure 71 mmHg.  RV poorly visualized   TRANSTHORACIC ECHOCARDIOGRAM  10/2018 - 11/2019   a) EF 60-65%.  GR 1 DD.  Focal basal LVH. Mod AI/ no AS. Mild  LA dilation.  Normal RV size and fxn.  Mod PI w/ mild TR.  Peak PAP ~ 38 mmHg; b) Normal EF 60 to 65%.  Moderate LVH-GR 1 DD.  R WMA.  Mild AS w/ trivial AI.  Grossly normal PA, RV and RA size.  Normal RV function.  Normal PA pressures.   TRANSTHORACIC ECHOCARDIOGRAM  01/2021    EF 55%.  Normal LV function.  Mild LVH.  GR 1 DD.  Normal PA pressures.  Severe LA dilation.  (Consistent with more significant diastolic dysfunction).  Mild-possibly mild to moderate aortic insufficiency.  Borderline elevated RAP.  Trivial pericardial effusion.  (Stable)   VAGINAL HYSTERECTOMY  1979    Medication: Current Facility-Administered Medications  Medication Dose Route Frequency Provider Last Rate Last Admin   acetaminophen (TYLENOL) tablet 650 mg  650 mg Oral Q6H PRN Bobette Mo,  MD   650 mg at 07/13/23 2142   Or   acetaminophen (TYLENOL) suppository 650 mg  650 mg Rectal Q6H PRN Bobette Mo, MD       alum & mag hydroxide-simeth (MAALOX/MYLANTA) 200-200-20 MG/5ML suspension 30 mL  30 mL Oral Q6H PRN Kc, Ramesh, MD   30 mL at 07/12/23 1401   bisacodyl (DULCOLAX) suppository 10 mg  10 mg Rectal Daily PRN Lanae Boast, MD   10 mg at 07/12/23 1401   carvedilol (COREG) tablet 6.25 mg  6.25 mg Oral Daily Dorcas Carrow, MD   6.25 mg at 07/14/23 0952   cephALEXin (KEFLEX) capsule 500 mg  500 mg Oral Q8H Dorcas Carrow, MD   500 mg at 07/14/23 1414   Chlorhexidine Gluconate Cloth 2 % PADS 6 each  6 each Topical Daily Lanae Boast, MD   6 each at 07/14/23 1217   cholecalciferol (VITAMIN D3) 25 MCG (1000 UNIT) tablet 1,000 Units  1,000 Units Oral Daily Kc, Dayna Barker, MD   1,000 Units at 07/14/23 0952   ketorolac (ACULAR) 0.5 % ophthalmic solution 1 drop  1 drop Both Eyes QID Dorcas Carrow, MD   1 drop at 07/14/23 1414   latanoprost (XALATAN) 0.005 % ophthalmic solution 1 drop  1 drop Both Eyes QHS Bobette Mo, MD   1 drop at 07/13/23 2142   levothyroxine (SYNTHROID) tablet 50 mcg  50 mcg Oral Q0600  Bobette Mo, MD   50 mcg at 07/14/23 0626   ondansetron [facility]) tablet 4 mg  4 mg Oral Q6H PRN Bobette Mo, MD       Or   ondansetron [facility]) injection 4 mg  4 mg Intravenous Q6H PRN Bobette Mo, MD       oxyCODONE (Oxy IR/ROXICODONE) immediate release tablet 5 mg  5 mg Oral Q6H PRN Bobette Mo, MD   5 mg at 07/14/23 0955   pantoprazole (PROTONIX) EC tablet 40 mg  40 mg Oral Daily Bobette Mo, MD   40 mg at 07/14/23 0955   polyethylene glycol (MIRALAX / GLYCOLAX) packet 17 g  17 g Oral Daily Dorcas Carrow, MD   17 g at 07/14/23 0955   QUEtiapine (SEROQUEL) tablet 25 mg  25 mg Oral QHS Dorcas Carrow, MD   25 mg at 07/13/23 2142   rosuvastatin (CRESTOR) tablet 20 mg  20 mg Oral QPM Dorcas Carrow, MD   20 mg at 07/13/23 1809    Allergies: Allergies  Allergen Reactions   Atorvastatin Other (See Comments)    Muscle pain   Codeine Nausea And Vomiting   Morphine And Codeine     B/p drops     Social History: Social History   Tobacco Use   Smoking status: Former    Current packs/day: 0.00    Average packs/day: 0.5 packs/day for 29.2 years (14.6 ttl pk-yrs)    Types: Cigarettes    Start date: 09/04/1964    Quit date: 11/25/1993    Years since quitting: 29.6   Smokeless tobacco: Never   Tobacco comments:    quit smoking in 1995  Vaping Use   Vaping status: Never Used  Substance Use Topics   Alcohol use: Yes    Comment: Socially.   Drug use: No    Family History Family History  Problem Relation Age of Onset   Uterine cancer Mother    Heart disease Mother    Hypertension Mother    Diabetes Mother    Cirrhosis Father  Pneumonia Father    Lymphoma Sister        ,non hodgkin    Diabetes Sister    Breast cancer Neg Hx     Review of Systems  Genitourinary:  Positive for urgency.    Objective   Vital signs in last 24 hours: BP (!) 140/77 (BP Location: Left Arm)   Pulse 71   Temp 97.7 F (36.5 C) (Oral)   Resp 17   Ht 5'  1" (1.549 m)   Wt 79 kg   SpO2 98%   BMI 32.91 kg/m   Physical Exam General: NAD, A&O, resting, appropriate HEENT: [place]/AT Pulmonary: Normal work of breathing Cardiovascular: RRR, no cyanosis GU: Foley catheter in place draining light red urine without clot material in tubing. Neuro: Appropriate, no focal neurological deficits  Most Recent Labs: Lab Results  Component Value Date   WBC 7.5 07/13/2023   HGB 9.7 (L) 07/13/2023   HCT 31.1 (L) 07/13/2023   PLT 276 07/13/2023    Lab Results  Component Value Date   NA 138 07/12/2023   K 3.7 07/12/2023   CL 117 (H) 07/12/2023   CO2 16 (L) 07/12/2023   BUN 29 (H) 07/12/2023   CREATININE 1.00 07/12/2023   CALCIUM 7.8 (L) 07/12/2023   MG 1.6 (L) 07/11/2023   PHOS 3.1 01/14/2022    Lab Results  Component Value Date   INR 1.14 08/18/2018   APTT 26 02/14/2014     Urine Culture: @[id number] (laburin,org,[id number],[id number])@   IMAGING: No results found.  ------  Elmon Kirschner, NP Pager: [phone]   Please contact the urology consult pager with any further questions/concerns.

## 2023-07-15 ENCOUNTER — Inpatient Hospital Stay (HOSPITAL_COMMUNITY): Payer: Medicare HMO

## 2023-07-15 DIAGNOSIS — N179 Acute kidney failure, unspecified: Secondary | ICD-10-CM | POA: Diagnosis not present

## 2023-07-15 LAB — CBC WITH DIFFERENTIAL/PLATELET
Abs Immature Granulocytes: 0.08 10*3/uL — ABNORMAL HIGH (ref 0.00–0.07)
Basophils Absolute: 0.1 10*3/uL (ref 0.0–0.1)
Basophils Relative: 1 %
Eosinophils Absolute: 0.3 10*3/uL (ref 0.0–0.5)
Eosinophils Relative: 4 %
HCT: 29.3 % — ABNORMAL LOW (ref 36.0–46.0)
Hemoglobin: 9.7 g/dL — ABNORMAL LOW (ref 12.0–15.0)
Immature Granulocytes: 1 %
Lymphocytes Relative: 25 %
Lymphs Abs: 2.1 10*3/uL (ref 0.7–4.0)
MCH: 30.6 pg (ref 26.0–34.0)
MCHC: 33.1 g/dL (ref 30.0–36.0)
MCV: 92.4 fL (ref 80.0–100.0)
Monocytes Absolute: 0.8 10*3/uL (ref 0.1–1.0)
Monocytes Relative: 9 %
Neutro Abs: 5 10*3/uL (ref 1.7–7.7)
Neutrophils Relative %: 60 %
Platelets: 258 10*3/uL (ref 150–400)
RBC: 3.17 MIL/uL — ABNORMAL LOW (ref 3.87–5.11)
RDW: 15.2 % (ref 11.5–15.5)
WBC: 8.3 10*3/uL (ref 4.0–10.5)
nRBC: 0 % (ref 0.0–0.2)

## 2023-07-15 LAB — GLUCOSE, CAPILLARY
Glucose-Capillary: 77 mg/dL (ref 70–99)
Glucose-Capillary: 79 mg/dL (ref 70–99)
Glucose-Capillary: 103 mg/dL — ABNORMAL HIGH (ref 70–99)
Glucose-Capillary: 97 mg/dL (ref 70–99)
Glucose-Capillary: 103 mg/dL — ABNORMAL HIGH (ref 70–99)

## 2023-07-15 MED ORDER — IOHEXOL 300 MG/ML  SOLN
100.0000 mL | Freq: Once | INTRAMUSCULAR | Status: AC | PRN
  Administered 2023-07-15: 100 mL via INTRAVENOUS

## 2023-07-15 NOTE — Progress Notes (Signed)
Occupational Therapy Treatment Patient Details Name: Tricia Harrison MRN: [id number] DOB: 09/19/1946 Today's Date: 07/15/2023   History of present illness Patient is a 77 year old female sent from her rehab facility due to decreased oral intake, several episodes of nausea and emesis earlier this week.  She has also been having hematochezia at the SNF. Pt recently s/p reverse R  TSA on 06/17/23 and discharged to SNF.  PMH: DVT,CAD,CKD, HTN, LTKA, DM, PE   OT comments  Patient was observed self feeding herself prior to entrance in room. Upon entrance patient reported that she was unable to feed herself though there was no spillage on gown and there was food on the fork ready to be brought to mouth. Patient reported that she was unable to feed herself. Patient was educated on importance of maintaining ability to self feed to prevent learned helplessness. Patient verbalized understanding and was able to take two more bites while therapist was in room with increased time. Session was stopped as therapist present in room was more of a hindrance to progress at this time. Nursing staff made aware. OT to continue to follow. Rehab potential is guarded at this time. Patient will benefit from continued inpatient follow up therapy, <3 hours/day       If plan is discharge home, recommend the following:  Two people to help with walking and/or transfers;Assistance with cooking/housework;Direct supervision/assist for medications management;Assist for transportation;Help with stairs or ramp for entrance;Direct supervision/assist for financial management;A lot of help with bathing/dressing/bathroom   Equipment Recommendations  None recommended by OT       Precautions / Restrictions Precautions Precautions: Fall;Shoulder Type of Shoulder Precautions: no ROM shoulder, OK for hand wrist and elbow ROM, per phone call on 7/24 with Dr.Landau patient is ok to use RUE on rollator for transfers and functional  mobility. Shoulder Interventions: Shoulder sling/immobilizer;At all times;Off for dressing/bathing/exercises Precaution Booklet Issued: Yes (comment) Precaution Comments: no, flexion/ABD right shoulder, may place hand on rollator and bear weight to RUE to transfer (all per previous admission about 2 weeks ago) Required Braces or Orthoses: Sling Restrictions Weight Bearing Restrictions: Yes RUE Weight Bearing: Non weight bearing Other Position/Activity Restrictions: OK to WB on RW for transfers       Mobility Bed Mobility               General bed mobility comments: patient declined to get out of bed today               ADL either performed or assessed with clinical judgement   ADL Overall ADL's : Needs assistance/impaired Eating/Feeding: Set up;Supervision/ safety;Sitting Eating/Feeding Details (indicate cue type and reason): patient was noted to be eating lunch prior to entrance into room. patient reported having difficulty with eating ut was noted to be able to pick up ensure and bring to mouth with no spillage or shaikness and place back on tray prior to therapist announcing entrance into room. patient reported having difficulty with eating. patient's tray was adjsuted lower and patient was able to scoop and bring to mouth with increased time x2 during session. patient noted to have confusion with reports that conflict with her telling therapist that she asked for nursing to feed her and then in the next statement reporting that she did not ask for them to feed her. education from weeked with reiterated with NT, nurse and patient with emphasis on importance of patient participation in self feeding to maintain independence and prevent learned helplessness. patient, NT and  nurse verbalized understanding.              Cognition Arousal: Alert Behavior During Therapy: Flat affect Overall Cognitive Status: Difficult to assess         General Comments: patient noted to  have aspects of learned helplessness during session with continued education on importance of participation in tasks that she can complete as independenlty as possible.                   Pertinent Vitals/ Pain       Pain Assessment Pain Assessment: Faces Faces Pain Scale: Hurts little more Pain Location: back and perineal area with foley Pain Descriptors / Indicators: Discomfort Pain Intervention(s): Limited activity within patient's tolerance, Monitored during session         Frequency           Progress Toward Goals  OT Goals(current goals can now be found in the care plan section)  Progress towards OT goals: OT to reassess next treatment     Plan Discharge plan remains appropriate    Co-evaluation                 AM-PAC OT "6 Clicks" Daily Activity     Outcome Measure   Help from another person eating meals?: A Little Help from another person taking care of personal grooming?: A Lot Help from another person toileting, which includes using toliet, bedpan, or urinal?: Total Help from another person bathing (including washing, rinsing, drying)?: Total Help from another person to put on and taking off regular upper body clothing?: Total Help from another person to put on and taking off regular lower body clothing?: Total 6 Click Score: 9    End of Session    OT Visit Diagnosis: Muscle weakness (generalized) (M62.81)   Activity Tolerance Other (comment) (self limiting behaviors)   Patient Left in bed;with call bell/phone within reach;with bed alarm set   Nurse Communication Other (comment) (self feeding issues)        Time: 1340-1356 OT Time Calculation (min): 16 min  Charges: OT General Charges $OT Visit: 1 Visit OT Treatments $Self Care/Home Management : 8-22 mins  Rosalio Loud, MS Acute Rehabilitation Department Office# [phone]   Selinda Flavin 07/15/2023, 3:49 PM

## 2023-07-15 NOTE — Progress Notes (Signed)
PROGRESS NOTE    Tricia Harrison  JOA:[id number] DOB: January 11, 1946 DOA: 07/04/2023 PCP: Eloisa Northern, MD    Brief Narrative:  77 year old with history of chronic anemia, chronic back pain, chronic constipation, CKD stage IIIb, hypertension and hyperlipidemia, hypothyroidism, dementia, history of pulmonary embolism currently on apixaban who recently underwent right shoulder replacement, significant postoperative delirium and confusion and was ultimately sent to skilled nursing rehab.  Patient was sent back from the facility with decreased oral intake, nausea and emesis as well as diarrhea along with stool mixed with blood after using laxatives.  She was also more confused than usual.  In the emergency room hemoglobin 8.9 at about baseline.  Creatinine 2.7 with baseline creatinine of 1.5-1.6.  Chest x-ray low lung volumes without evidence of acute cardiovascular disease.  99% on room air.  Found significantly dehydrated, complaint of diarrhea.  Admitted due to significant findings. Recurrent urinary retention, Foley catheter placed 8/13 with 1300 mL urine output followed by hemorrhagic urine. CT scan with bilateral hydronephrosis due to urinary retention, rectum full of stool.  Flexible sigmoidoscopy with no mass, stool evacuated. Remains in the hospital with hematuria, unable to tolerate anticoagulation. 8/19, hematuria recurred after 2 days on Eliquis.  Kidney functions are stable.   Assessment & Plan:   Acute kidney injury with chronic kidney disease stage IIIb: Historical baseline creatinine 1.5.  Presented with creatinine 2.7.  Did not respond to IV fluids, responded to Foley catheter placement.   Renal function has further improved and normalized. Prerenal with poor appetite, poor oral intake.  Valsartan use. Found to have significant urine retention with hydronephrosis, Foley catheter with 1300 mL retention. Continues to have hematuria and unable to tolerate Eliquis challenge. Holding  Eliquis.  Urology following.  Today started on CBI.  Improving however not on Eliquis. She may need cystoscopy.  Hematuria: Suspected secondary to decompression of bladder in a patient who is taking Eliquis.  Recurrent hematuria after restarting on Eliquis. Received 1 unit of PRBC and has remained stable since then.  Acute on chronic anemia, anemia of acute blood loss due to hematuria:  Hemoglobin 7.3.  1 unit PRBC transfusion.  Appropriate response.    Acute UTI, hemorrhagic cystitis, urinary retention: Urine culture with multiple species.  Treated as complicated UTI.  Completed antibiotic therapy.  Chronic constipation now with diarrhea: With evidence of stool in the rectal vault.  Flex sig.  Continue bowel regimen.  Avoid constipation. Rectal bleeding has stopped. C. difficile and GI pathogen panel is negative.  Type 2 diabetes, diet controlled.  On carb modified diet.  Episode of hypoglycemia.  Allow regular diet.  Does not need treatment for diabetes.  She has poor intake, will allow regular diet.  Essential hypertension, dehydrated.  Holding antihypertensives.  Hypothyroidism, Synthroid.  Continue.  GERD, on Prilosec.  History of pulmonary embolism on Eliquis: Last pulmonary embolism and DVT in 2019.  Will discontinue Eliquis if unable to control bleeding.  Cognitive decline: Patient will need formal neurocognitive evaluation.  Family requested neurology consult.  made ambulatory referral to [facility] neurology to be seen as a routine consult outpatient.  Recent right shoulder surgery: Working with PT OT.  Anticipate back to SNF.   DVT prophylaxis: SCDs Start: 07/04/23 1731   Code Status: Full code Family Communication: Patient's son and daughter-in-law over the phone. Disposition Plan: Status is: Inpatient.  Monitoring for recurrent hematuria.   Consultants:  GI Urology  Procedures:  Flex sig  Antimicrobials:  Rocephin 8/11--- 8/15 Keflex  8/15---8/19  Subjective:  Patient seen during morning rounds.  She denies any complaints.  She is worried about not having Eliquis.  We discussed about difficulty with Eliquis and ongoing issues with bleeding.  Patient tells me she has an eye condition that she cannot go without Eliquis for 3 days.  Patient does have cataract and inflammatory condition but Eliquis is used for PE and DVT. Called and discussed with patient's children to verify.  Objective: Vitals:   07/14/23 1307 07/14/23 2225 07/15/23 0503 07/15/23 1404  BP: (!) 140/77 110/60 (!) 146/77 133/70  Pulse: 71 63 (!) 58 75  Resp: 17 18 18 19   Temp: 97.7 F (36.5 C) 98.6 F (37 C) 97.7 F (36.5 C) 97.6 F (36.4 C)  TempSrc: Oral Oral Oral Oral  SpO2: 98% 99% 98% 96%  Weight:      Height:        Intake/Output Summary (Last 24 hours) at 07/15/2023 1620 Last data filed at 07/15/2023 1300 Gross per 24 hour  Intake 688 ml  Output 1050 ml  Net -362 ml   Filed Weights   07/07/23 1009  Weight: 79 kg    Examination:  General: Fairly comfortable.  Frail.  On room air.  Alert awake and oriented.  Interactive. Cardiovascular: S1-S2 normal.  Regular rate rhythm. Respiratory: Bilateral clear.  No added sounds. Gastrointestinal: Soft.  Nontender.  Pendulous.  No rigidity or guarding. Foley catheter was with dark urine, maroon-colored.  Clearing with CBI.   Ext: Trace bilateral edema.   Data Reviewed: I have personally reviewed following labs and imaging studies  CBC: Recent Labs  Lab 07/10/23 0551 07/11/23 0521 07/12/23 0623 07/13/23 0550 07/15/23 1044  WBC 7.1 7.9 7.1 7.5 8.3  NEUTROABS 4.6 4.7 5.2  --  5.0  HGB 7.3* 9.3* 9.2* 9.7* 9.7*  HCT 23.7* 29.6* 29.1* 31.1* 29.3*  MCV 99.2 95.8 95.1 98.1 92.4  PLT 302 272 274 276 258   Basic Metabolic Panel: Recent Labs  Lab 07/10/23 0551 07/11/23 0521 07/12/23 0623  NA 136 137 138  K 3.5 3.6 3.7  CL 116* 115* 117*  CO2 14* 15* 16*  GLUCOSE 78 72 81  BUN  40* 31* 29*  CREATININE 1.35* 1.02* 1.00  CALCIUM 7.4* 7.7* 7.8*  MG  --  1.6*  --    GFR: Estimated Creatinine Clearance: 44.8 mL/min (by C-G formula based on SCr of 1 mg/dL). Liver Function Tests: No results for input(s): "AST", "ALT", "ALKPHOS", "BILITOT", "PROT", "ALBUMIN" in the last 168 hours.  No results for input(s): "LIPASE", "AMYLASE" in the last 168 hours. No results for input(s): "AMMONIA" in the last 168 hours. Coagulation Profile: No results for input(s): "INR", "PROTIME" in the last 168 hours. Cardiac Enzymes: No results for input(s): "CKTOTAL", "CKMB", "CKMBINDEX", "TROPONINI" in the last 168 hours.  BNP (last 3 results) No results for input(s): "PROBNP" in the last 8760 hours. HbA1C: No results for input(s): "HGBA1C" in the last 72 hours. CBG: Recent Labs  Lab 07/14/23 1613 07/14/23 2217 07/15/23 0730 07/15/23 0836 07/15/23 1141  GLUCAP 95 101* 77 79 103*   Lipid Profile: No results for input(s): "CHOL", "HDL", "LDLCALC", "TRIG", "CHOLHDL", "LDLDIRECT" in the last 72 hours. Thyroid Function Tests: No results for input(s): "TSH", "T4TOTAL", "FREET4", "T3FREE", "THYROIDAB" in the last 72 hours. Anemia Panel: No results for input(s): "VITAMINB12", "FOLATE", "FERRITIN", "TIBC", "IRON", "RETICCTPCT" in the last 72 hours. Sepsis Labs: No results for input(s): "PROCALCITON", "LATICACIDVEN" in the last 168 hours.  No results found for  this or any previous visit (from the past 240 hour(s)).        Radiology Studies: No results found.      Scheduled Meds:  carvedilol  6.25 mg Oral Daily   Chlorhexidine Gluconate Cloth  6 each Topical Daily   cholecalciferol  1,000 Units Oral Daily   feeding supplement  237 mL Oral BID BM   ketorolac  1 drop Both Eyes QID   latanoprost  1 drop Both Eyes QHS   levothyroxine  50 mcg Oral Q0600   pantoprazole  40 mg Oral Daily   polyethylene glycol  17 g Oral Daily   QUEtiapine  25 mg Oral QHS   rosuvastatin  20 mg  Oral QPM   Continuous Infusions:     LOS: 10 days    Time spent: 35 minutes    Dorcas Carrow, MD [facility]

## 2023-07-15 NOTE — Progress Notes (Addendum)
CBI slow gtt going since 11am.  Urine is clear yellow out of top of catheter.

## 2023-07-15 NOTE — Plan of Care (Signed)
  Problem: Education: Goal: Knowledge of the prescribed therapeutic regimen will improve Outcome: Progressing Goal: Understanding of activity limitations/precautions following surgery will improve Outcome: Progressing Goal: Individualized Educational Video(s) Outcome: Progressing   Problem: Activity: Goal: Ability to tolerate increased activity will improve Outcome: Progressing   Problem: Pain Management: Goal: Pain level will decrease with appropriate interventions Outcome: Progressing   Problem: Education: Goal: Knowledge of General Education information will improve Description: Including pain rating scale, medication(s)/side effects and non-pharmacologic comfort measures Outcome: Progressing   Problem: Health Behavior/Discharge Planning: Goal: Ability to manage health-related needs will improve Outcome: Progressing   Problem: Clinical Measurements: Goal: Ability to maintain clinical measurements within normal limits will improve Outcome: Progressing Goal: Will remain free from infection Outcome: Progressing Goal: Diagnostic test results will improve Outcome: Progressing Goal: Respiratory complications will improve Outcome: Progressing Goal: Cardiovascular complication will be avoided Outcome: Progressing   Problem: Activity: Goal: Risk for activity intolerance will decrease Outcome: Progressing   Problem: Nutrition: Goal: Adequate nutrition will be maintained Outcome: Progressing   Problem: Coping: Goal: Level of anxiety will decrease Outcome: Progressing   Problem: Elimination: Goal: Will not experience complications related to bowel motility Outcome: Progressing Goal: Will not experience complications related to urinary retention Outcome: Progressing   Problem: Pain Managment: Goal: General experience of comfort will improve Outcome: Progressing   Problem: Safety: Goal: Ability to remain free from injury will improve Outcome: Progressing   Problem:  Skin Integrity: Goal: Risk for impaired skin integrity will decrease Outcome: Progressing   Problem: Education: Goal: Ability to describe self-care measures that may prevent or decrease complications (Diabetes Survival Skills Education) will improve Outcome: Progressing Goal: Individualized Educational Video(s) Outcome: Progressing   Problem: Coping: Goal: Ability to adjust to condition or change in health will improve Outcome: Progressing   Problem: Fluid Volume: Goal: Ability to maintain a balanced intake and output will improve Outcome: Progressing   Problem: Health Behavior/Discharge Planning: Goal: Ability to identify and utilize available resources and services will improve Outcome: Progressing Goal: Ability to manage health-related needs will improve Outcome: Progressing   Problem: Metabolic: Goal: Ability to maintain appropriate glucose levels will improve Outcome: Progressing   Problem: Nutritional: Goal: Maintenance of adequate nutrition will improve Outcome: Progressing Goal: Progress toward achieving an optimal weight will improve Outcome: Progressing   Problem: Skin Integrity: Goal: Risk for impaired skin integrity will decrease Outcome: Progressing   Problem: Tissue Perfusion: Goal: Adequacy of tissue perfusion will improve Outcome: Progressing

## 2023-07-15 NOTE — Progress Notes (Signed)
   6 Days Post-Op Subjective: Resting in bed. Good sense of humor. Met with er son and daughter in law today to review plan.   Objective: Vital signs in last 24 hours: Temp:  [97.7 F (36.5 C)-98.6 F (37 C)] 97.7 F (36.5 C) (08/20 0503) Pulse Rate:  [58-71] 58 (08/20 0503) Resp:  [17-18] 18 (08/20 0503) BP: (110-146)/(60-77) 146/77 (08/20 0503) SpO2:  [98 %-99 %] 98 % (08/20 0503)  Assessment/Plan: #Hematuria Last dose of Eliquis was 8/19 am. Hematuria improving per granddaughter. Hold as long as medically reasonable Encourage fluids Light red, no clot accumulation Hgb improved from 9.2-->9.5 yesterday. Trend H&H. No labs available today. Exchanged foley for 3-way and started CBI 8/20.  CT hematuria today Cystoscopy on outpatient basis.   Intake/Output from previous day: 08/19 0701 - 08/20 0700 In: 234 [P.O.:234] Out: 850 [Urine:850]  Intake/Output this shift: Total I/O In: 351 [P.O.:351] Out: 200 [Urine:200]  Physical Exam:  General: Alert and oriented CV: No cyanosis Lungs: equal chest rise Abdomen: Soft, NTND, no rebound or guarding Gu: Three-way hematuria catheter in place.  CBI on low gtt.  Light pink irrigant  Lab Results: Recent Labs    07/13/23 0550  HGB 9.7*  HCT 31.1*   BMET No results for input(s): "NA", "K", "CL", "CO2", "GLUCOSE", "BUN", "CREATININE", "CALCIUM" in the last 72 hours.   Studies/Results: No results found.    LOS: 10 days   Elmon Kirschner, NP [facility] Pager: [phone]  07/15/2023, 11:29 AM

## 2023-07-15 NOTE — Progress Notes (Signed)
Clamped CBI fluids due to urine being clear in foley cath tubing, per order.

## 2023-07-16 DIAGNOSIS — N179 Acute kidney failure, unspecified: Secondary | ICD-10-CM | POA: Diagnosis not present

## 2023-07-16 LAB — GLUCOSE, CAPILLARY
Glucose-Capillary: 86 mg/dL (ref 70–99)
Glucose-Capillary: 102 mg/dL — ABNORMAL HIGH (ref 70–99)

## 2023-07-16 MED ORDER — ENOXAPARIN SODIUM 40 MG/0.4ML IJ SOSY
40.0000 mg | PREFILLED_SYRINGE | Freq: Every day | INTRAMUSCULAR | Status: DC
  Administered 2023-07-16: 40 mg via SUBCUTANEOUS
  Filled 2023-07-16: qty 0.4

## 2023-07-16 MED ORDER — ENSURE ENLIVE PO LIQD: 237.0000 mL | Freq: Two times a day (BID) | ORAL | Status: AC

## 2023-07-16 MED ORDER — BISACODYL 10 MG RE SUPP: 10.0000 mg | Freq: Once | RECTAL | Status: DC

## 2023-07-16 MED ORDER — ENOXAPARIN SODIUM 40 MG/0.4ML IJ SOSY: 40.0000 mg | PREFILLED_SYRINGE | Freq: Every day | INTRAMUSCULAR | Status: DC

## 2023-07-16 MED ORDER — POLYETHYLENE GLYCOL 3350 17 G PO PACK: 17.0000 g | PACK | Freq: Every day | ORAL | Status: AC

## 2023-07-16 MED ORDER — TRAMADOL HCL 50 MG PO TABS: 50.0000 mg | ORAL_TABLET | ORAL | 0 refills | Status: AC | PRN

## 2023-07-16 NOTE — Discharge Summary (Signed)
Physician Discharge Summary  Genaveve Ortez KYH:[id number] DOB: 13-Sep-1946 DOA: 07/04/2023  PCP: Eloisa Northern, MD  Admit date: 07/04/2023 Discharge date: 07/16/2023  Admitted From: Skilled nursing facility Disposition: Skilled nursing facility  Recommendations for Outpatient Follow-up:  Follow up with PCP in 1-2 weeks Please obtain BMP/CBC in one week Call and schedule follow-up with urology.  Continue Foley catheter until then.  Discharge Condition: Stable, fair CODE STATUS: Full code Diet recommendation: Regular diet, nutritional supplements, dysphagia 3 diet.  Aspiration precautions  Discharge summary: 77 year old with history of chronic anemia, chronic back pain, chronic constipation, CKD stage IIIb, hypertension and hyperlipidemia, hypothyroidism, dementia, history of pulmonary embolism currently on apixaban who recently underwent right shoulder replacement, significant postoperative delirium and confusion and was ultimately sent to skilled nursing rehab.  Patient was sent back from the facility with decreased oral intake, nausea and emesis as well as diarrhea along with stool mixed with blood after using laxatives.  She was also more confused than usual.  In the emergency room hemoglobin 8.9 at about baseline.  Creatinine 2.7. Chest x-ray low lung volumes without evidence of acute cardiovascular disease.  99% on room air.  Found significantly dehydrated, complaint of diarrhea.  Admitted due to significant findings. Recurrent urinary retention, Foley catheter placed 8/13 with 1300 mL urine output followed by hemorrhagic urine. CT scan with bilateral hydronephrosis due to urinary retention, rectum full of stool.  Flexible sigmoidoscopy with no mass, stool evacuated. Remained in the hospital with hematuria, unable to tolerate anticoagulation. Multiple attempt to challenge her with Eliquis, she will have hematuria every time challenged with Eliquis.  Decided to hold Eliquis until hematuria  clears.  Will treat with Lovenox every day as prophylactic until she can go back on Eliquis.     Assessment & plan of care:   Acute kidney injury with chronic kidney disease stage IIIb: Presented with creatinine 2.7.  Not much response to IV fluid.  Responded to catheterization and renal functions normalized.  Prerenal with poor appetite, poor oral intake.  Valsartan use. Found to have significant urine retention with hydronephrosis, Foley catheter with 1300 mL retention. Urology consulted.  She cleared up when Eliquis will start.  She was challenged 3 times with restarting Eliquis, every time she will have hematuria.  Urology currently not planning to do any procedure. Urine is clear, see discussion below about anticoagulation. Discharging with Foley catheter in place. Urology will schedule follow-up.   Hematuria: Suspected secondary to decompression of bladder in a patient who is taking Eliquis.  Recurrent hematuria after restarting on Eliquis x 3. Received total of 1 unit of PRBC and has remained stable since then.   Acute on chronic anemia, anemia of acute blood loss due to hematuria:  Hemoglobin 7.3.  1 unit PRBC transfusion.  Appropriate response.  No further drop in hemoglobin since then.   Acute UTI, hemorrhagic cystitis, urinary retention: Urine culture with multiple species.  Treated as complicated UTI.  Completed antibiotic therapy for 10 days.   Chronic constipation: With evidence of stool in the rectal vault.  Flex sig.  Continue bowel regimen.  Avoid constipation. Rectal bleeding has stopped. C. difficile and GI pathogen panel is negative. Continue with daily stool softener and MiraLAX.  Avoid constipation.   Type 2 diabetes, diet controlled.  On carb modified diet.  Episode of hypoglycemia.  Allow regular diet.  Does not need treatment for diabetes.  She has poor intake, will allow regular diet.   Essential hypertension, dehydrated.  Continue carvedilol.  Discontinue rest  of the blood pressure medications including valsartan.   Hypothyroidism, Synthroid.  Continue.   GERD, on Prilosec.   History of pulmonary embolism on Eliquis: Last pulmonary embolism and DVT in 2019. Does not have medical records suggestive of recurrent thromboembolic event. Patient is with decreased mobility, high risk of thromboembolism including DVT.  Since he is not able to go on therapeutic Eliquis, we will continue Lovenox subcutaneous injection as prophylaxis until she has improved mobility and she can go back on Eliquis after urology follow-up.  Cognitive decline: Patient will need formal neurocognitive evaluation.  Family requested neurology consult.  made ambulatory referral to [facility] neurology to be seen as a routine consult outpatient.   Recent right shoulder surgery: Working with PT OT.  Transfer back to SNF.  Medically stable to transfer to skilled level of care.  Patient will keep Foley catheter on discharge until urology follow-up.  Patient will need a lot of encouragement to eat and drink and keep herself hydrated.   Discharge Diagnoses:  Principal Problem:   AKI (acute kidney injury) (HCC) Active Problems:   Essential hypertension   Hypothyroidism   Hyperlipidemia LDL goal <100   Hernia, hiatal   GERD (gastroesophageal reflux disease)   Type 2 diabetes mellitus (HCC)   Obstructive sleep apnea   Atherosclerosis of aorta (HCC)   Chronic kidney disease, stage 3b (HCC)   Thrombocytosis   Chronic constipation   Acute on chronic blood loss anemia   Grade I diastolic dysfunction   Acute kidney injury (AKI) with acute tubular necrosis (ATN) (HCC)   Rectal bleeding   Abnormal CT scan, colon    Discharge Instructions  Discharge Instructions     Ambulatory referral to Neurology   Complete by: As directed    An appointment is requested in approximately: 4 weeks   Diet general   Complete by: As directed    Dysphagia 3 diet, chopped and fine food.   Discharge  wound care:   Complete by: As directed    Protect all pressure points.   Increase activity slowly   Complete by: As directed       Allergies as of 07/16/2023       Reactions   Atorvastatin Other (See Comments)   Muscle pain   Codeine Nausea And Vomiting   Morphine And Codeine    B/p drops         Medication List     STOP taking these medications    cloNIDine 0.1 MG tablet Commonly known as: Catapres   diltiazem 180 MG 24 hr capsule Commonly known as: CARDIZEM CD   Eliquis 5 MG Tabs tablet Generic drug: apixaban   Moderna COVID-19 Vaccine 100 MCG/0.5ML injection Generic drug: COVID-19 mRNA vaccine (Moderna)   valsartan 80 MG tablet Commonly known as: Diovan       TAKE these medications    acetaminophen 650 MG CR tablet Commonly known as: TYLENOL Take 650 mg by mouth every 8 (eight) hours as needed for pain.   albuterol 108 (90 Base) MCG/ACT inhaler Commonly known as: VENTOLIN HFA Inhale 2 puffs into the lungs every 6 (six) hours as needed.   carvedilol 6.25 MG tablet Commonly known as: COREG Take 6.25 mg by mouth in the morning. What changed: Another medication with the same name was removed. Continue taking this medication, and follow the directions you see here.   enoxaparin 40 MG/0.4ML injection Commonly known as: LOVENOX Inject 0.4 mLs (40 mg total) into the skin daily.  feeding supplement Liqd Take 237 mLs by mouth 2 (two) times daily between meals.   fenofibrate 145 MG tablet Commonly known as: TRICOR Take 145 mg by mouth daily.   Humira (2 Pen) 40 MG/0.4ML pen Generic drug: adalimumab Inject 40 mg into the skin every 14 (fourteen) days.   hydrocortisone cream 1 % Apply 1 Application topically daily as needed for itching.   ketorolac 0.5 % ophthalmic solution Commonly known as: ACULAR Place 1 drop into both eyes in the morning, at noon, and at bedtime. Verified correct strength per Ophthalmalogist progress notes   levothyroxine 50  MCG tablet Commonly known as: SYNTHROID Take 50 mcg by mouth daily before breakfast.   lidocaine 5 % Commonly known as: LIDODERM Place 1 patch onto the skin daily as needed (pain).   liver oil-zinc oxide 40 % ointment Commonly known as: DESITIN Apply 1 Application topically as needed for irritation.   Lumigan 0.01 % Soln Generic drug: bimatoprost Place 1 drop into both eyes at bedtime.   omeprazole 20 MG capsule Commonly known as: PRILOSEC Take 20 mg by mouth daily.   ondansetron 4 MG tablet Commonly known as: Zofran Take 1 tablet (4 mg total) by mouth every 8 (eight) hours as needed for nausea or vomiting.   polyethylene glycol 17 g packet Commonly known as: MIRALAX / GLYCOLAX Take 17 g by mouth daily. What changed:  when to take this reasons to take this   QUEtiapine 25 MG tablet Commonly known as: SEROQUEL Take 25 mg by mouth at bedtime.   rosuvastatin 20 MG tablet Commonly known as: CRESTOR TAKE 1 TABLET BY MOUTH  DAILY AT 6 PM. What changed: when to take this   sennosides-docusate sodium 8.6-50 MG tablet Commonly known as: SENOKOT-S Take 2 tablets by mouth daily.   traMADol 50 MG tablet Commonly known as: ULTRAM Take 1 tablet (50 mg total) by mouth every 4 (four) hours as needed for moderate pain or severe pain.   VITAMIN D3 PO Take 1 tablet by mouth daily.   Voltaren 1 % Gel Generic drug: diclofenac Sodium Apply 2 g topically daily as needed (pain).               Discharge Care Instructions  (From admission, onward)           Start     Ordered   07/16/23 0000  Discharge wound care:       Comments: Protect all pressure points.   07/16/23 1249            Follow-up Information     Rene Paci, MD Follow up.   Specialty: Urology Why: Call to schedule next available appointment for bladder scope Contact information: [address] [place] [address] [phone]                Allergies   Allergen Reactions   Atorvastatin Other (See Comments)    Muscle pain   Codeine Nausea And Vomiting   Morphine And Codeine     B/p drops     Consultations: Urology   Procedures/Studies: CT HEMATURIA WORKUP  Result Date: 07/16/2023 CLINICAL DATA:  Hematuria, gross hematuria EXAM: CT ABDOMEN AND PELVIS WITHOUT AND WITH CONTRAST TECHNIQUE: Multidetector CT imaging of the abdomen and pelvis was performed following the standard protocol before and following the bolus administration of intravenous contrast. RADIATION DOSE REDUCTION: This exam was performed according to the departmental dose-optimization program which includes automated exposure control, adjustment of the mA  and/or kV according to patient size and/or use of iterative reconstruction technique. CONTRAST:  OMNIPAQUE IOHEXOL 300 MG/ML  SOLN COMPARISON:  07/08/2023 FINDINGS: Lower chest: Bilateral small pleural effusions and bibasilar atelectasis. Hepatobiliary: No focal hepatic lesion. Postcholecystectomy. No biliary dilatation. Pancreas: Pancreas is normal. No ductal dilatation. No pancreatic inflammation. Spleen: Normal spleen Adrenals/urinary tract: Adrenal glands and kidneys are normal. Simple fluid attenuation cyst of the RIGHT kidney measures 22 mm. The ureters and bladder normal. Bladder collapsed around Foley catheter. Stomach/Bowel: Stomach, small bowel, appendix, and cecum are normal. The colon and rectosigmoid colon are normal. Vascular/Lymphatic: Abdominal aorta is normal caliber with atherosclerotic calcification. There is no retroperitoneal or periportal lymphadenopathy. No pelvic lymphadenopathy. Reproductive: Unremarkable Other: No free fluid. Mild anasarca of the soft tissues along the flanks. Musculoskeletal: No aggressive osseous lesion. IMPRESSION: 1. No explanation for hematuria. Normal kidneys ureters and bladder. Bladder collapsed around Foley catheter. 2. Right Bosniak I benign renal cyst measuring 2.2 cm. No  follow-up imaging is recommended.JACR 2018 Feb; 264-273, Management of the Incidental Renal Mass on CT, [employer] 2021; [phone], Bosniak Classification of Cystic Renal Masses, Version 2019. 3. Bilateral small pleural effusions and bibasilar atelectasis. 4. Mild anasarca of the soft tissues along the flanks. 5. Aortic Atherosclerosis (ICD10-I70.0). Electronically Signed   By: Genevive Bi M.D.   On: 07/16/2023 09:27   DG Swallowing Func-Speech Pathology  Result Date: 07/09/2023 Table formatting from the original result was not included. Modified Barium Swallow Study Patient Details Name: Marea Detert MRN: [id number] Date of Birth: 20-Sep-1946 Today's Date: 07/09/2023 HPI/PMH: HPI: 77 year old with history of chronic anemia, chronic back pain, chronic constipation, CKD stage IIIb, hypertension and hyperlipidemia, hypothyroidism, dementia, history of pulmonary embolism recently underwent right shoulder replacement, significant postoperative delirium and confusion and was ultimately sent to skilled nursing rehab.  Patient was sent back from the facility with decreased oral intake, nausea and emesis as well as diarrhea along with stool mixed with blood after using laxatives.   Chest x-ray low lung volumes without evidence of acute cardiovascular disease. BSE with possible velopharyngeal dysfunction with hypernasality and complaints of nasal regugitation. MBS 01/14/22 without penetration or aspiration but possible decreased UES opening with barium pooled above UES. Continue Dys 3/thin. Clinical Impression: Clinical Impression: Pt demonstrates mild oropharyngeal dysphagia without laryngeal penetration or aspiration. Impairments include decreased velopharyngeal function, pharyngeal stripping wave and tongue base retraction. Contact between her velum and pharyngeal wall is decreased allowing mild amount of barium to enter this space which did not advance further. There was residue in the vallecular space and  pharyngeal wall increasing with viscosity and she swallows 2-4 times in attempts to clear which she eventually reduces to minimal. Orally she had more difficulty transiting the thicker liquids and possibly due to decreased pressure needed to generate transfer. She was able to transit nectar however honey thick she was unable and had to expectorate majority of the bolus. Mastication with solid was slower and deliberate but she was able to break up the solid bolus and initiate a swallow with moderate residue. Subsequent swallows sufficiently decreased stasis. Her PES distention may have been slightly reduced but there was no significant residue seen above this space. Esophageal scan was somewhat limited due to view however no significant findings present. Pt does not wish to have liquid diet and prefers to stay on solids with chopped meats, Dys 3. Continue thin liquids, multiple swallows and alternate liquids and solids. Pt and RN states she consumes pills with liquid without difficulty (  not given during study). ST will continue to follow. Factors that may increase risk of adverse event in presence of aspiration [employer]ye [employer]aks [employer]ance Coots 2021): No data recorded Recommendations/Plan: Swallowing Evaluation Recommendations Swallowing Evaluation Recommendations Recommendations: PO diet PO Diet Recommendation: Dysphagia 3 (Mechanical soft); Thin liquids (Level 0) Liquid Administration via: Straw; Cup Medication Administration: Whole meds with liquid Supervision: Patient able to self-feed; Intermittent supervision/cueing for swallowing strategies Swallowing strategies  : Slow rate; Small bites/sips; Multiple dry swallows after each bite/sip; Follow solids with liquids Postural changes: Position pt fully upright for meals Oral care recommendations: Oral care BID (2x/day) Treatment Plan Treatment Plan Treatment recommendations: Therapy as outlined in treatment plan below Follow-up recommendations: -- (TBD) Functional status  assessment: Patient has had a recent decline in their functional status and demonstrates the ability to make significant improvements in function in a reasonable and predictable amount of time. Treatment frequency: Min 2x/week Treatment duration: 2 weeks Interventions: Aspiration precaution training; Patient/family education; Diet toleration management by SLP Recommendations Recommendations for follow up therapy are one component of a multi-disciplinary discharge planning process, led by the attending physician.  Recommendations may be updated based on patient status, additional functional criteria and insurance authorization. Assessment: Orofacial Exam: Orofacial Exam Oral Cavity: Oral Hygiene: WFL Oral Cavity - Dentition: Adequate natural dentition Orofacial Anatomy: WFL Oral Motor/Sensory Function: WFL Anatomy: Anatomy: WFL Boluses Administered: Boluses Administered Boluses Administered: Thin liquids (Level 0); Mildly thick liquids (Level 2, nectar thick); Moderately thick liquids (Level 3, honey thick); Puree; Solid  Oral Impairment Domain: Oral Impairment Domain Lip Closure: No labial escape Tongue control during bolus hold: Cohesive bolus between tongue to palatal seal Bolus preparation/mastication: Slow prolonged chewing/mashing with complete recollection Bolus transport/lingual motion: Delayed initiation of tongue motion (oral holding) Oral residue: Majority of bolus remaining; Minimal to no clearance (with honey) Location of oral residue : Tongue Initiation of pharyngeal swallow : Valleculae  Pharyngeal Impairment Domain: Pharyngeal Impairment Domain Soft palate elevation: Trace column of contrast or air between SP and PW (slight- tip) Laryngeal elevation: Complete superior movement of thyroid cartilage with complete approximation of arytenoids to epiglottic petiole Anterior hyoid excursion: Complete anterior movement Epiglottic movement: Complete inversion Laryngeal vestibule closure: Complete, no  air/contrast in laryngeal vestibule Pharyngeal stripping wave : Present - diminished Pharyngoesophageal segment opening: Partial distention/partial duration, partial obstruction of flow Tongue base retraction: Trace column of contrast or air between tongue base and PPW Pharyngeal residue: Collection of residue within or on pharyngeal structures Location of pharyngeal residue: Valleculae  Esophageal Impairment Domain: Esophageal Impairment Domain Esophageal clearance upright position: Complete clearance, esophageal coating Pill: No data recorded Penetration/Aspiration Scale Score: Penetration/Aspiration Scale Score 1.  Material does not enter airway: Thin liquids (Level 0); Mildly thick liquids (Level 2, nectar thick); Moderately thick liquids (Level 3, honey thick); Puree; Solid Compensatory Strategies: Compensatory Strategies Compensatory strategies: No   General Information: Caregiver present: No  Diet Prior to this Study: Regular; Thin liquids (Level 0)   Temperature : Normal   Respiratory Status: WFL   Supplemental O2: None (Room air)   History of Recent Intubation: No  Behavior/Cognition: Alert; Cooperative; Pleasant mood Self-Feeding Abilities: Needs assist with self-feeding Baseline vocal quality/speech: Abnormal resonance (hypernasal) Volitional Cough: Able to elicit Volitional Swallow: Able to elicit Exam Limitations: No limitations Goal Planning: Prognosis for improved oropharyngeal function: Good No data recorded No data recorded No data recorded Consulted and agree with results and recommendations: Patient; Nurse Pain: Pain Assessment Pain Assessment: Faces Faces Pain Scale: 2 Pain Location: -- (  sitting up in chair) Pain Descriptors / Indicators: Discomfort Pain Intervention(s): Monitored during session End of Session: Start Time:SLP Start Time (ACUTE ONLY): 0921 Stop Time: SLP Stop Time (ACUTE ONLY): 1610 Time Calculation:SLP Time Calculation (min) (ACUTE ONLY): 17 min Charges: SLP Evaluations $ SLP  Speech Visit: 1 Visit SLP Evaluations $BSS Swallow: 1 Procedure $MBS Swallow: 1 Procedure SLP visit diagnosis: SLP Visit Diagnosis: Dysphagia, oropharyngeal phase (R13.12) Past Medical History: Past Medical History: Diagnosis Date  Anemia   as a child  Anginal pain (HCC)   Asthma   as a child  Bladder incontinence   Chronic back pain   spinal stenosis and buldging disc;scoliosis  Chronic constipation   occasionally takes something OTC  Chronic kidney disease   Diabetes mellitus without complication (HCC)   per pt "Pre" for 20+yrs  Diverticulosis   DVT (deep venous thrombosis) (HCC)   Dyspnea   occasional  Gallstones   has known about this for 3-60yrs  GERD (gastroesophageal reflux disease)   takes Omeprazole daily  Glaucoma   H/O hiatal hernia   Heart murmur   Hemorrhoids   History of blood transfusion   no abnormal reaction noted  History of bronchitis   states its been a long time ago  History of gout   HLD (hyperlipidemia)   takes Fenofibrate daily  HTN (hypertension)   takes Diltiazem and Ramipril daily  Hypothyroidism   takes Synthroid daily  Left knee DJD   Macular degeneration   Nocturia   Palpitations   started in 2013 and only occasionally;last time noticed about 2-3wks ago and after drinking caffeine  Peripheral vascular disease (HCC)   PONV (postoperative nausea and vomiting)   problems swallowing after anethesia- 2/2023and slurred speech also  Pulmonary embolism with acute cor pulmonale (HCC) 07/2018  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  Sleep apnea   does not uses cpap Past Surgical History: Past Surgical History: Procedure Laterality Date  3-day EVENT MONITOR  01/2019  Mostly normal monitor.  Predominantly sinus rhythm (rate range 48-105 bpm, average 67 bpm) 9 short atrial runs (fastest = 13 beats rate 135 bpm, longest ~17 seconds-101 bpm) -> not noted on symptom log.  No sustained arrhythmias (tachycardia or bradycardia), or pauses..   Symptoms are noted with PVCs.  bilateral cataract surgery     CHOLECYSTECTOMY N/A 01/10/2022  Procedure: LAPAROSCOPIC CHOLECYSTECTOMY WITH INTRAOPERATIVE CHOLANGIOGRAM;  Surgeon: Karie Soda, MD;  Location: [facility] ORS;  Service: General;  Laterality: N/A;  COLONOSCOPY    CT ANGIOGRAM OF CHEST - PE PROTOCOL  07/2018  Bilateral nonocclusive pulmonary emboli evidence of right heart strain consistent with "submassive "/intermediate PE.  -->  Code PE called.  DILATION AND CURETTAGE OF UTERUS    multiple about 6-7 times  ESOPHAGOGASTRODUODENOSCOPY    NM VQ LUNG SCAN ([facility] HX)  11/23/2018  Low probability for PE  REVERSE SHOULDER ARTHROPLASTY Right 06/17/2023  Procedure: RIGHT REVERSE SHOULDER ARTHROPLASTY;  Surgeon: Teryl Lucy, MD;  Location: [facility] ORS;  Service: Orthopedics;  Laterality: Right;  RIGHT/LEFT HEART CATH AND CORONARY ANGIOGRAPHY N/A 08/19/2018  Procedure: RIGHT/LEFT HEART CATH AND CORONARY ANGIOGRAPHY;  Surgeon: Corky Crafts, MD;  Location: [facility] INVASIVE CV LAB;  Service: Cardiovascular:: Nonobstructive CAD.  Mild pulmonary pretension.  Mean PA pressure 26 mmHg with PA P 48/20 mmHg.  Normal LVEDP.  THYROID SURGERY Right 2010  TOTAL HIP ARTHROPLASTY Left 2005  TOTAL KNEE ARTHROPLASTY Left 02/21/2014  Procedure:  TOTAL KNEE ARTHROPLASTY;  Surgeon: Nilda Simmer, MD;  Location: [facility];  Service: Orthopedics;  Laterality: Left;  TRANSTHORACIC ECHOCARDIOGRAM  07/2018   (In setting of acute PE) mild LVH.  EF 55 to 60%.  GR 1 DD.  Aortic sclerosis but no stenosis.  Mild regurgitation.  Mild RA dilation.  Moderate LA dilation.  Moderate tricuspid regurgitation with severe primary hypertension estimated PA pressure 71 mmHg.  RV poorly visualized  TRANSTHORACIC ECHOCARDIOGRAM  10/2018 - 11/2019  a) EF 60-65%.  GR 1 DD.  Focal basal LVH. Mod AI/ no AS. Mild LA dilation.  Normal RV size and fxn.  Mod PI w/ mild TR.  Peak PAP ~ 38 mmHg; b) Normal EF 60 to 65%.  Moderate LVH-GR 1 DD.  R WMA.  Mild AS w/ trivial AI.   Grossly normal PA, RV and RA size.  Normal RV function.  Normal PA pressures.  TRANSTHORACIC ECHOCARDIOGRAM  01/2021   EF 55%.  Normal LV function.  Mild LVH.  GR 1 DD.  Normal PA pressures.  Severe LA dilation.  (Consistent with more significant diastolic dysfunction).  Mild-possibly mild to moderate aortic insufficiency.  Borderline elevated RAP.  Trivial pericardial effusion.  (Stable)  VAGINAL HYSTERECTOMY  1979 Royce Macadamia 07/09/2023, 10:35 AM   CT ABDOMEN PELVIS WO CONTRAST  Result Date: 07/08/2023 CLINICAL DATA:  Severe left lower quadrant abdominal pain, bloody stools EXAM: CT ABDOMEN AND PELVIS WITHOUT CONTRAST TECHNIQUE: Multidetector CT imaging of the abdomen and pelvis was performed following the standard protocol without IV contrast. RADIATION DOSE REDUCTION: This exam was performed according to the departmental dose-optimization program which includes automated exposure control, adjustment of the mA and/or kV according to patient size and/or use of iterative reconstruction technique. COMPARISON:  01/30/2017 FINDINGS: Lower chest: Mild cardiomegaly. Moderate pericardial effusion. Coronary artery calcifications. Trace bilateral pleural effusions and associated atelectasis or consolidation. Moderate hiatal hernia with intrathoracic position of the gastric fundus. Hepatobiliary: No focal liver abnormality is seen. Status post cholecystectomy. No biliary dilatation. Pancreas: Unremarkable. No pancreatic ductal dilatation or surrounding inflammatory changes. Spleen: Normal in size without significant abnormality. Adrenals/Urinary Tract: Adrenal glands are unremarkable. Moderate bilateral hydronephrosis and hydroureter to the ureterovesicular junction without calculus or other obstruction. Distended urinary bladder. Stomach/Bowel: Stomach is within normal limits. Appendix appears normal. Soft tissue thickening about the low rectum (series 3, image 78). Cecal, descending and sigmoid diverticulosis.  Vascular/Lymphatic: Aortic atherosclerosis. No enlarged abdominal or pelvic lymph nodes. Reproductive: Status post hysterectomy. Other: No abdominal wall hernia.  Anasarca.  No ascites. Musculoskeletal: No acute or significant osseous findings. Status post left hip total arthroplasty. IMPRESSION: 1. Soft tissue thickening about the low rectum. This is nonspecific; neoplasm not excluded. Recommend direct visualization. 2. Moderate bilateral hydronephrosis and hydroureter to the ureterovesicular junction without calculus or other obstruction. This is presumably secondary to back pressure in the setting of urinary bladder distention. Correlate for urinary retention. 3. Cecal, descending and sigmoid diverticulosis without evidence of acute diverticulitis. 4. Moderate pericardial effusion. 5. Trace bilateral pleural effusions and associated atelectasis or consolidation. 6. Moderate hiatal hernia with intrathoracic position of the gastric fundus. 7. Coronary artery disease. Aortic Atherosclerosis (ICD10-I70.0). Electronically Signed   By: Jearld Lesch M.D.   On: 07/08/2023 14:16   VAS [place] LOWER EXTREMITY VENOUS (DVT) (7a-7p)  Result Date: 07/04/2023  Lower Venous DVT Study Patient Name:  Cornell ANN Burley  Date of Exam:   07/04/2023 Medical Rec #: [id number]  Accession #:    [id number] Date of Birth: 08/17/1946             Patient Gender: F Patient Age:   28 years Exam Location:  [facility] Procedure:      VAS [place] LOWER EXTREMITY VENOUS (DVT) Referring Phys: Vanetta Mulders --------------------------------------------------------------------------------  Indications: Hx of DVT, shoulder surgery 2 weeks ago, increased swelling in thigh and lateral calf.  Anticoagulation: Eliquis. Limitations: Body habitus and poor ultrasound/tissue interface. Comparison Study: Previous 08/21/18 age indeterminate DVT Performing Technologist: McKayla Maag RVT, VT  Examination Guidelines: A complete evaluation includes  B-mode imaging, spectral Doppler, color Doppler, and power Doppler as needed of all accessible portions of each vessel. Bilateral testing is considered an integral part of a complete examination. Limited examinations for reoccurring indications may be performed as noted. The reflux portion of the exam is performed with the patient in reverse Trendelenburg.  +-----+---------------+---------+-----------+----------+--------------+ RIGHTCompressibilityPhasicitySpontaneityPropertiesThrombus Aging +-----+---------------+---------+-----------+----------+--------------+ CFV  Full           Yes      Yes                                 +-----+---------------+---------+-----------+----------+--------------+ SFJ  Full                                                        +-----+---------------+---------+-----------+----------+--------------+   +--------+---------------+---------+-----------+----------+--------------------+ LEFT    CompressibilityPhasicitySpontaneityPropertiesThrombus Aging       +--------+---------------+---------+-----------+----------+--------------------+ CFV     Full           Yes      Yes                                       +--------+---------------+---------+-----------+----------+--------------------+ SFJ     Full                                                              +--------+---------------+---------+-----------+----------+--------------------+ FV Prox Full                                                              +--------+---------------+---------+-----------+----------+--------------------+ FV Mid  Full                                                              +--------+---------------+---------+-----------+----------+--------------------+ FV                     Yes      Yes                  patent by color  Distal                                               doppler               +--------+---------------+---------+-----------+----------+--------------------+ PFV     Full                                                              +--------+---------------+---------+-----------+----------+--------------------+ POP     Full           Yes      Yes                                       +--------+---------------+---------+-----------+----------+--------------------+ PTV     Full                                                              +--------+---------------+---------+-----------+----------+--------------------+ PERO    Full                                                              +--------+---------------+---------+-----------+----------+--------------------+     Summary: RIGHT: - No evidence of common femoral vein obstruction.  LEFT: - There is no evidence of deep vein thrombosis in the lower extremity. However, portions of this examination were limited- see technologist comments above.  - No cystic structure found in the popliteal fossa.  *See table(s) above for measurements and observations. Electronically signed by Gerarda Fraction on 07/04/2023 at 3:40:36 PM.    Final    DG Chest Port 1 View  Result Date: 07/04/2023 CLINICAL DATA:  77 year old female with history of DVT EXAM: PORTABLE CHEST 1 VIEW COMPARISON:  11/23/2018 FINDINGS: Left rotation of the patient. Cardiomediastinal silhouette borderline enlarged in size and contour. No evidence of central vascular congestion. No interlobular septal thickening. Low lung volumes. No pneumothorax or pleural effusion. Coarsened interstitial markings, with no confluent airspace disease. Interval surgical changes of the right shoulder. Degenerative changes of the left GH joint. Scoliotic curvature. IMPRESSION: Low lung volumes without evidence of acute cardiopulmonary disease. Electronically Signed   By: Gilmer Mor D.O.   On: 07/04/2023 15:27   DG Shoulder Right Port  Result Date: 06/17/2023 CLINICAL DATA:   Status post reverse total shoulder arthroplasty, right. EXAM: RIGHT SHOULDER - 1 VIEW COMPARISON:  None Available. FINDINGS: Single frontal view of the shoulder obtained. Reverse total arthroplasty in place. No periprosthetic fracture. Recent postsurgical change includes air and edema in the soft tissues and joint space. IMPRESSION: Reverse total right shoulder arthroplasty in expected alignment. Electronically Signed   By: Narda Rutherford M.D.   On: 06/17/2023 13:22   (Echo, Carotid, EGD, Colonoscopy, ERCP)  Subjective: Patient seen and examined. No overnight events.  No bowel movement since yesterday.  Off CBI since 5 PM yesterday.  Urine is amber-colored with no evidence of active bleeding.  Called and discussed with patient's family.   Discharge Exam: Vitals:   07/15/23 2042 07/16/23 0612  BP: (!) 142/64 (!) 144/65  Pulse: 70 61  Resp: 16 17  Temp: 98 F (36.7 C) 98.1 F (36.7 C)  SpO2: 100% 100%   Vitals:   07/15/23 0503 07/15/23 1404 07/15/23 2042 07/16/23 0612  BP: (!) 146/77 133/70 (!) 142/64 (!) 144/65  Pulse: (!) 58 75 70 61  Resp: 18 19 16 17   Temp: 97.7 F (36.5 C) 97.6 F (36.4 C) 98 F (36.7 C) 98.1 F (36.7 C)  TempSrc: Oral Oral Oral Oral  SpO2: 98% 96% 100% 100%  Weight:      Height:        General: Pt is alert, awake, not in acute distress Chronically sick looking.  Frail and debilitated.  Mostly on room air.  She is mostly oriented.  Has gross generalized weakness. Cardiovascular: RRR, S1/S2 +, no rubs, no gallops Respiratory: CTA bilaterally, no wheezing, no rhonchi Abdominal: Soft, NT, ND, bowel sounds +, Foley catheter with amber-colored urine mostly clear. Extremities: 1+ edema both legs. Right shoulder with postop findings, incisions clean and intact.    The results of significant diagnostics from this hospitalization (including imaging, microbiology, ancillary and laboratory) are listed below for reference.     Microbiology: No results  found for this or any previous visit (from the past 240 hour(s)).   Labs: BNP (last 3 results) No results for input(s): "BNP" in the last 8760 hours. Basic Metabolic Panel: Recent Labs  Lab 07/10/23 0551 07/11/23 0521 07/12/23 0623  NA 136 137 138  K 3.5 3.6 3.7  CL 116* 115* 117*  CO2 14* 15* 16*  GLUCOSE 78 72 81  BUN 40* 31* 29*  CREATININE 1.35* 1.02* 1.00  CALCIUM 7.4* 7.7* 7.8*  MG  --  1.6*  --    Liver Function Tests: No results for input(s): "AST", "ALT", "ALKPHOS", "BILITOT", "PROT", "ALBUMIN" in the last 168 hours. No results for input(s): "LIPASE", "AMYLASE" in the last 168 hours. No results for input(s): "AMMONIA" in the last 168 hours. CBC: Recent Labs  Lab 07/10/23 0551 07/11/23 0521 07/12/23 0623 07/13/23 0550 07/15/23 1044  WBC 7.1 7.9 7.1 7.5 8.3  NEUTROABS 4.6 4.7 5.2  --  5.0  HGB 7.3* 9.3* 9.2* 9.7* 9.7*  HCT 23.7* 29.6* 29.1* 31.1* 29.3*  MCV 99.2 95.8 95.1 98.1 92.4  PLT 302 272 274 276 258   Cardiac Enzymes: No results for input(s): "CKTOTAL", "CKMB", "CKMBINDEX", "TROPONINI" in the last 168 hours. BNP: Invalid input(s): "POCBNP" CBG: Recent Labs  Lab 07/15/23 1141 07/15/23 1639 07/15/23 2117 07/16/23 0739 07/16/23 1147  GLUCAP 103* 97 103* 86 102*   D-Dimer No results for input(s): "DDIMER" in the last 72 hours. Hgb A1c No results for input(s): "HGBA1C" in the last 72 hours. Lipid Profile No results for input(s): "CHOL", "HDL", "LDLCALC", "TRIG", "CHOLHDL", "LDLDIRECT" in the last 72 hours. Thyroid function studies No results for input(s): "TSH", "T4TOTAL", "T3FREE", "THYROIDAB" in the last 72 hours.  Invalid input(s): "FREET3" Anemia work up No results for input(s): "VITAMINB12", "FOLATE", "FERRITIN", "TIBC", "IRON", "RETICCTPCT" in the last 72 hours. Urinalysis    Component Value Date/Time   COLORURINE YELLOW 07/05/2023 1500   APPEARANCEUR CLOUDY (A) 07/05/2023 1500   LABSPEC 1.009 07/05/2023  1500   PHURINE 8.0  07/05/2023 1500   GLUCOSEU NEGATIVE 07/05/2023 1500   HGBUR MODERATE (A) 07/05/2023 1500   BILIRUBINUR NEGATIVE 07/05/2023 1500   KETONESUR NEGATIVE 07/05/2023 1500   PROTEINUR 100 (A) 07/05/2023 1500   UROBILINOGEN 0.2 02/14/2014 1156   NITRITE NEGATIVE 07/05/2023 1500   LEUKOCYTESUR LARGE (A) 07/05/2023 1500   Sepsis Labs Recent Labs  Lab 07/11/23 0521 07/12/23 0623 07/13/23 0550 07/15/23 1044  WBC 7.9 7.1 7.5 8.3   Microbiology No results found for this or any previous visit (from the past 240 hour(s)).   Time coordinating discharge:  32 minutes  SIGNED:   Dorcas Carrow, MD  [facility] 07/16/2023, 12:50 PM

## 2023-07-16 NOTE — TOC Transition Note (Addendum)
Transition of Care [facility]) - CM/SW Discharge Note   Patient Details  Name: Tricia Harrison MRN: [id number] Date of Birth: 01-23-1946  [facility]) CM/SW Contact:  Beckie Busing, RN Phone [phone]  07/16/2023, 12:54 PM   Clinical Narrative: Per Md patient is ready for discharge. CM has verified with [place] in admissions that patient is ok to return. Awaiting d/c summary.   [id number] Discharge summary has been faxed.Please call report to  [facility]  [phone] Room # 506. D/c packet placed in chart at nurses station.   [id number] Transportation has been arranged per [employer] and son Viviann Spare updated.      Barriers to Discharge: Continued Medical Work up   Patient Goals and CMS Choice CMS Medicare.gov Compare Post Acute Care list provided to::  (n/a) Choice offered to / list presented to : NA  Discharge Placement                         Discharge Plan and Services Additional resources added to the After Visit Summary for   In-house Referral: NA Discharge Planning Services: CM Consult Post Acute Care Choice: Skilled Nursing Facility          DME Arranged: N/A         HH Arranged: NA HH Agency: NA        Social Determinants of Health (SDOH) Interventions SDOH Screenings   Food Insecurity: No Food Insecurity (07/05/2023)  Housing: Low Risk  (07/05/2023)  Transportation Needs: No Transportation Needs (07/05/2023)  Utilities: Not At Risk (07/05/2023)  Depression (PHQ2-9): Low Risk  (11/09/2018)  Tobacco Use: Medium Risk (07/09/2023)     Readmission Risk Interventions    07/08/2023    4:14 PM  Readmission Risk Prevention Plan  Transportation Screening Complete  PCP or Specialist Appt within 5-7 Days Complete  Home Care Screening Complete  Medication Review (RN CM) Complete

## 2023-07-16 NOTE — [place] FL2 (Signed)
[place] MEDICAID FL2 LEVEL OF CARE FORM     IDENTIFICATION  Patient Name: Tricia Harrison Birthdate: 24-Mar-1946 Sex: female Admission Date (Current Location): 07/04/2023  [facility] and [place] Number:  Producer, television/film/video and Address:  [facility],  [address] [place], [place] [address]      Provider Number: [id number]  Attending Physician Name and Address:  Dorcas Carrow, MD  Relative Name and Phone Number:  daughter in law Dorthey Sawyer [phone]    Current Level of Care:   Recommended Level of Care: Skilled Nursing Facility Prior Approval Number:    Date Approved/Denied:   PASRR Number: [id number]  Discharge Plan: SNF    Current Diagnoses: Patient Active Problem List   Diagnosis Date Noted   Rectal bleeding 07/09/2023   Abnormal CT scan, colon 07/09/2023   Acute kidney injury (AKI) with acute tubular necrosis (ATN) (HCC) 07/05/2023   AKI (acute kidney injury) (HCC) 07/04/2023   Thrombocytosis 07/04/2023   Gallstones 07/04/2023   Hemorrhoid 07/04/2023   Scoliosis 07/04/2023   Acute on chronic blood loss anemia 07/04/2023   Grade I diastolic dysfunction 07/04/2023   S/P reverse total shoulder arthroplasty, 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   Anemia 06/16/2023   Chronic constipation 02/04/2022   Uses wheelchair 01/14/2022   Velopharyngeal dysfunction with mild swallowing difficulty 01/14/2022   Uses roller walker 01/11/2022   Atherosclerosis of aorta (HCC) 01/11/2022   Chronic kidney disease, stage 3b (HCC) 01/11/2022   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   Orthostasis 01/11/2022   Leukocytosis 01/11/2022   Chronic cholecystitis 01/10/2022   Obstructive sleep apnea 03/09/2019   Aortic regurgitation 12/02/2018   History of pulmonary embolism 08/20/2018   DOE (dyspnea on exertion)    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   Bilateral ovarian cysts    Hx of gallstones    Hernia, hiatal    Renal cysts, acquired, bilateral    GERD  (gastroesophageal reflux disease)    Diverticulosis    Lumbar spinal stenosis    Type 2 diabetes mellitus (HCC)    Left knee DJD    Rapid palpitations 12/06/2013   Morbid obesity (HCC) 12/06/2013   Fatigue 12/06/2013   PVC (premature ventricular contraction) 12/06/2013   Essential hypertension    Hypothyroidism    Hyperlipidemia LDL goal <100     Orientation RESPIRATION BLADDER Height & Weight     Self, Time, Situation, Place  Normal Incontinent Weight: 79 kg Height:  5\' 1"  (154.9 cm)  BEHAVIORAL SYMPTOMS/MOOD NEUROLOGICAL BOWEL NUTRITION STATUS     (n/a) Continent Diet  AMBULATORY STATUS COMMUNICATION OF NEEDS Skin   Extensive Assist Verbally PU Stage and Appropriate Care (stage 2 right sacrum)   PU Stage 2 Dressing:  (Foam dressing change every 3 days)                   Personal Care Assistance Level of Assistance  Bathing, Feeding, Dressing Bathing Assistance: Limited assistance Feeding assistance: Limited assistance Dressing Assistance: Limited assistance     Functional Limitations Info  Sight, Hearing, Speech Sight Info: Impaired Hearing Info: Adequate Speech Info: Adequate    SPECIAL CARE FACTORS FREQUENCY  PT (By licensed PT), OT (By licensed OT)     PT Frequency: 5x/wk OT Frequency: 5x/wk            Contractures Contractures Info: Not present    Additional Factors Info  Code Status, Allergies, Psychotropic, Insulin Sliding Scale, Isolation Precautions, Suctioning Needs Code Status Info: Full Allergies Info: Atorvastin,  Codeine, Morphine Psychotropic Info: see d/c summary Insulin Sliding Scale Info: see d.c summary Isolation Precautions Info: n/a Suctioning Needs: n/a   Current Medications (07/16/2023):  This is the current hospital active medication list Current Facility-Administered Medications  Medication Dose Route Frequency Provider Last Rate Last Admin   acetaminophen (TYLENOL) tablet 650 mg  650 mg Oral Q6H PRN Charlott Rakes, MD    650 mg at 07/15/23 1001   Or   acetaminophen (TYLENOL) suppository 650 mg  650 mg Rectal Q6H PRN Charlott Rakes, MD       alum & mag hydroxide-simeth (MAALOX/MYLANTA) 200-200-20 MG/5ML suspension 30 mL  30 mL Oral Q6H PRN Kc, Ramesh, MD   30 mL at 07/12/23 1401   bisacodyl (DULCOLAX) suppository 10 mg  10 mg Rectal Once Dorcas Carrow, MD       carvedilol (COREG) tablet 6.25 mg  6.25 mg Oral Daily Charlott Rakes, MD   6.25 mg at 07/16/23 1036   Chlorhexidine Gluconate Cloth 2 % PADS 6 each  6 each Topical Daily Kc, Dayna Barker, MD   6 each at 07/16/23 1051   cholecalciferol (VITAMIN D3) 25 MCG (1000 UNIT) tablet 1,000 Units  1,000 Units Oral Daily Kc, Ramesh, MD   1,000 Units at 07/16/23 1036   enoxaparin (LOVENOX) injection 40 mg  40 mg Subcutaneous Daily Ghimire, Lyndel Safe, MD       feeding supplement (ENSURE ENLIVE / ENSURE PLUS) liquid 237 mL  237 mL Oral BID BM Ghimire, Kuber, MD   237 mL at 07/16/23 1051   ketorolac (ACULAR) 0.5 % ophthalmic solution 1 drop  1 drop Both Eyes QID Charlott Rakes, MD   1 drop at 07/16/23 1051   latanoprost (XALATAN) 0.005 % ophthalmic solution 1 drop  1 drop Both Eyes QHS Charlott Rakes, MD   1 drop at 07/15/23 2111   levothyroxine (SYNTHROID) tablet 50 mcg  50 mcg Oral Q0600 Charlott Rakes, MD   50 mcg at 07/16/23 0643   ondansetron (ZOFRAN) tablet 4 mg  4 mg Oral Q6H PRN Charlott Rakes, MD       Or   ondansetron [facility]) injection 4 mg  4 mg Intravenous Q6H PRN Charlott Rakes, MD       oxyCODONE (Oxy IR/ROXICODONE) immediate release tablet 5 mg  5 mg Oral Q6H PRN Charlott Rakes, MD   5 mg at 07/16/23 1036   pantoprazole (PROTONIX) EC tablet 40 mg  40 mg Oral Daily Charlott Rakes, MD   40 mg at 07/16/23 1036   polyethylene glycol (MIRALAX / GLYCOLAX) packet 17 g  17 g Oral Daily Dorcas Carrow, MD   17 g at 07/16/23 1036   QUEtiapine (SEROQUEL) tablet 25 mg  25 mg Oral QHS Charlott Rakes, MD   25 mg at 07/15/23 2102   rosuvastatin  (CRESTOR) tablet 20 mg  20 mg Oral QPM Charlott Rakes, MD   20 mg at 07/15/23 1703     Discharge Medications: Please see discharge summary for a list of discharge medications.  Relevant Imaging Results:  Relevant Lab Results:   Additional Information SS# [id number]  Beckie Busing, RN

## 2023-07-16 NOTE — TOC Transition Note (Deleted)
Transition of Care [facility]) - CM/SW Discharge Note   Patient Details  Name: Tricia Harrison MRN: [id number] Date of Birth: 1946/10/01  [facility] CM/SW Contact:  Beckie Busing, RN Phone Number:[phone]  07/16/2023, 12:50 PM   Clinical Narrative:    Per Md patient is ready for discharge. CM spoke with [place] in admissions at [facility]. Per [place] patient is good to return. Insurance auth  approved for [employer] 8/14-8/26 certification # [id number]. Will fax d/c summary to [facility] when available.      Barriers to Discharge: Continued Medical Work up   Patient Goals and CMS Choice CMS Medicare.gov Compare Post Acute Care list provided to::  (n/a) Choice offered to / list presented to : NA  Discharge Placement                         Discharge Plan and Services Additional resources added to the After Visit Summary for   In-house Referral: NA Discharge Planning Services: CM Consult Post Acute Care Choice: Skilled Nursing Facility          DME Arranged: N/A         HH Arranged: NA HH Agency: NA        Social Determinants of Health (SDOH) Interventions SDOH Screenings   Food Insecurity: No Food Insecurity (07/05/2023)  Housing: Low Risk  (07/05/2023)  Transportation Needs: No Transportation Needs (07/05/2023)  Utilities: Not At Risk (07/05/2023)  Depression (PHQ2-9): Low Risk  (11/09/2018)  Tobacco Use: Medium Risk (07/09/2023)     Readmission Risk Interventions    07/08/2023    4:14 PM  Readmission Risk Prevention Plan  Transportation Screening Complete  PCP or Specialist Appt within 5-7 Days Complete  Home Care Screening Complete  Medication Review (RN CM) Complete

## 2023-07-16 NOTE — Progress Notes (Signed)
Patient refused blood sugar checks. Re-attempted. Patient refused once more.

## 2023-07-16 NOTE — Progress Notes (Signed)
Report called to Keturah Shavers, Charity fundraiser at [employer].

## 2023-07-16 NOTE — Care Management Important Message (Signed)
Important Message  Patient Details IM Letter given. Name: Denese Deshane MRN: [id number] Date of Birth: 10-Dec-1945   Medicare Important Message Given:  Yes     [name] 07/16/2023, 9:56 AM

## 2023-07-16 NOTE — Progress Notes (Signed)
Called [facility] to give pt report, RN unable to take at this time. Contact number given for RN to call back for report on pt.

## 2023-07-22 DIAGNOSIS — R2689 Other abnormalities of gait and mobility: Secondary | ICD-10-CM

## 2023-07-22 DIAGNOSIS — I10 Essential (primary) hypertension: Secondary | ICD-10-CM | POA: Diagnosis not present

## 2023-07-22 DIAGNOSIS — Z7689 Persons encountering health services in other specified circumstances: Secondary | ICD-10-CM

## 2023-07-22 DIAGNOSIS — E039 Hypothyroidism, unspecified: Secondary | ICD-10-CM | POA: Diagnosis not present

## 2023-07-22 DIAGNOSIS — N1832 Chronic kidney disease, stage 3b: Secondary | ICD-10-CM

## 2023-07-22 DIAGNOSIS — R471 Dysarthria and anarthria: Secondary | ICD-10-CM

## 2023-07-22 DIAGNOSIS — U071 COVID-19: Secondary | ICD-10-CM

## 2023-07-22 DIAGNOSIS — D649 Anemia, unspecified: Secondary | ICD-10-CM | POA: Diagnosis not present

## 2023-07-22 DIAGNOSIS — E119 Type 2 diabetes mellitus without complications: Secondary | ICD-10-CM | POA: Diagnosis not present

## 2023-07-22 DIAGNOSIS — M6281 Muscle weakness (generalized): Secondary | ICD-10-CM

## 2023-07-22 DIAGNOSIS — I951 Orthostatic hypotension: Secondary | ICD-10-CM

## 2023-07-23 DIAGNOSIS — D649 Anemia, unspecified: Secondary | ICD-10-CM

## 2023-07-23 DIAGNOSIS — E119 Type 2 diabetes mellitus without complications: Secondary | ICD-10-CM

## 2023-07-23 DIAGNOSIS — I10 Essential (primary) hypertension: Secondary | ICD-10-CM

## 2023-07-23 DIAGNOSIS — R471 Dysarthria and anarthria: Secondary | ICD-10-CM | POA: Diagnosis not present

## 2023-07-23 DIAGNOSIS — R41841 Cognitive communication deficit: Secondary | ICD-10-CM | POA: Diagnosis not present

## 2023-07-23 DIAGNOSIS — M6281 Muscle weakness (generalized): Secondary | ICD-10-CM | POA: Diagnosis not present

## 2023-07-23 DIAGNOSIS — N1832 Chronic kidney disease, stage 3b: Secondary | ICD-10-CM

## 2023-07-23 DIAGNOSIS — R2689 Other abnormalities of gait and mobility: Secondary | ICD-10-CM | POA: Diagnosis not present

## 2023-07-24 ENCOUNTER — Encounter (HOSPITAL_COMMUNITY): Payer: Self-pay | Admitting: Radiology

## 2023-07-24 ENCOUNTER — Emergency Department (HOSPITAL_COMMUNITY): Payer: Medicare HMO

## 2023-07-24 ENCOUNTER — Inpatient Hospital Stay (HOSPITAL_COMMUNITY)
Admission: EM | Admit: 2023-07-24 | Discharge: 2023-08-06 | DRG: 177 | Disposition: A | Discharge status: Hospice | Payer: Medicare HMO | Source: Skilled Nursing Facility | Attending: Internal Medicine | Admitting: Internal Medicine

## 2023-07-24 ENCOUNTER — Other Ambulatory Visit: Payer: Self-pay

## 2023-07-24 DIAGNOSIS — E1122 Type 2 diabetes mellitus with diabetic chronic kidney disease: Secondary | ICD-10-CM | POA: Diagnosis present

## 2023-07-24 DIAGNOSIS — I071 Rheumatic tricuspid insufficiency: Secondary | ICD-10-CM | POA: Diagnosis present

## 2023-07-24 DIAGNOSIS — R002 Palpitations: Secondary | ICD-10-CM

## 2023-07-24 DIAGNOSIS — E1151 Type 2 diabetes mellitus with diabetic peripheral angiopathy without gangrene: Secondary | ICD-10-CM | POA: Diagnosis present

## 2023-07-24 DIAGNOSIS — Z90710 Acquired absence of both cervix and uterus: Secondary | ICD-10-CM

## 2023-07-24 DIAGNOSIS — K649 Unspecified hemorrhoids: Secondary | ICD-10-CM | POA: Diagnosis present

## 2023-07-24 DIAGNOSIS — Z96611 Presence of right artificial shoulder joint: Secondary | ICD-10-CM | POA: Diagnosis present

## 2023-07-24 DIAGNOSIS — U071 COVID-19: Secondary | ICD-10-CM | POA: Diagnosis not present

## 2023-07-24 DIAGNOSIS — E43 Unspecified severe protein-calorie malnutrition: Secondary | ICD-10-CM | POA: Diagnosis present

## 2023-07-24 DIAGNOSIS — H409 Unspecified glaucoma: Secondary | ICD-10-CM | POA: Diagnosis present

## 2023-07-24 DIAGNOSIS — A419 Sepsis, unspecified organism: Secondary | ICD-10-CM

## 2023-07-24 DIAGNOSIS — M549 Dorsalgia, unspecified: Secondary | ICD-10-CM | POA: Diagnosis present

## 2023-07-24 DIAGNOSIS — R627 Adult failure to thrive: Secondary | ICD-10-CM | POA: Diagnosis present

## 2023-07-24 DIAGNOSIS — Z885 Allergy status to narcotic agent status: Secondary | ICD-10-CM

## 2023-07-24 DIAGNOSIS — K5909 Other constipation: Secondary | ICD-10-CM | POA: Diagnosis present

## 2023-07-24 DIAGNOSIS — N1832 Chronic kidney disease, stage 3b: Secondary | ICD-10-CM | POA: Diagnosis present

## 2023-07-24 DIAGNOSIS — Z87891 Personal history of nicotine dependence: Secondary | ICD-10-CM

## 2023-07-24 DIAGNOSIS — K5900 Constipation, unspecified: Secondary | ICD-10-CM | POA: Diagnosis present

## 2023-07-24 DIAGNOSIS — H3530 Unspecified macular degeneration: Secondary | ICD-10-CM | POA: Diagnosis present

## 2023-07-24 DIAGNOSIS — G8929 Other chronic pain: Secondary | ICD-10-CM | POA: Diagnosis present

## 2023-07-24 DIAGNOSIS — Z79890 Hormone replacement therapy (postmenopausal): Secondary | ICD-10-CM

## 2023-07-24 DIAGNOSIS — E11649 Type 2 diabetes mellitus with hypoglycemia without coma: Secondary | ICD-10-CM | POA: Diagnosis present

## 2023-07-24 DIAGNOSIS — Z888 Allergy status to other drugs, medicaments and biological substances status: Secondary | ICD-10-CM

## 2023-07-24 DIAGNOSIS — G9341 Metabolic encephalopathy: Secondary | ICD-10-CM | POA: Diagnosis not present

## 2023-07-24 DIAGNOSIS — E039 Hypothyroidism, unspecified: Secondary | ICD-10-CM | POA: Diagnosis present

## 2023-07-24 DIAGNOSIS — J45909 Unspecified asthma, uncomplicated: Secondary | ICD-10-CM | POA: Diagnosis present

## 2023-07-24 DIAGNOSIS — M419 Scoliosis, unspecified: Secondary | ICD-10-CM | POA: Diagnosis present

## 2023-07-24 DIAGNOSIS — Z515 Encounter for palliative care: Secondary | ICD-10-CM

## 2023-07-24 DIAGNOSIS — Z6836 Body mass index (BMI) 36.0-36.9, adult: Secondary | ICD-10-CM

## 2023-07-24 DIAGNOSIS — Y846 Urinary catheterization as the cause of abnormal reaction of the patient, or of later complication, without mention of misadventure at the time of the procedure: Secondary | ICD-10-CM | POA: Diagnosis present

## 2023-07-24 DIAGNOSIS — E119 Type 2 diabetes mellitus without complications: Secondary | ICD-10-CM

## 2023-07-24 DIAGNOSIS — G4733 Obstructive sleep apnea (adult) (pediatric): Secondary | ICD-10-CM | POA: Diagnosis present

## 2023-07-24 DIAGNOSIS — R339 Retention of urine, unspecified: Secondary | ICD-10-CM | POA: Diagnosis present

## 2023-07-24 DIAGNOSIS — Z96652 Presence of left artificial knee joint: Secondary | ICD-10-CM | POA: Diagnosis present

## 2023-07-24 DIAGNOSIS — R609 Edema, unspecified: Secondary | ICD-10-CM | POA: Diagnosis present

## 2023-07-24 DIAGNOSIS — J9811 Atelectasis: Secondary | ICD-10-CM | POA: Diagnosis present

## 2023-07-24 DIAGNOSIS — Z7901 Long term (current) use of anticoagulants: Secondary | ICD-10-CM

## 2023-07-24 DIAGNOSIS — D631 Anemia in chronic kidney disease: Secondary | ICD-10-CM | POA: Diagnosis present

## 2023-07-24 DIAGNOSIS — Z8249 Family history of ischemic heart disease and other diseases of the circulatory system: Secondary | ICD-10-CM

## 2023-07-24 DIAGNOSIS — T83511A Infection and inflammatory reaction due to indwelling urethral catheter, initial encounter: Secondary | ICD-10-CM | POA: Diagnosis not present

## 2023-07-24 DIAGNOSIS — L89152 Pressure ulcer of sacral region, stage 2: Secondary | ICD-10-CM | POA: Diagnosis present

## 2023-07-24 DIAGNOSIS — E871 Hypo-osmolality and hyponatremia: Secondary | ICD-10-CM | POA: Diagnosis present

## 2023-07-24 DIAGNOSIS — T83518A Infection and inflammatory reaction due to other urinary catheter, initial encounter: Secondary | ICD-10-CM | POA: Diagnosis present

## 2023-07-24 DIAGNOSIS — E8809 Other disorders of plasma-protein metabolism, not elsewhere classified: Secondary | ICD-10-CM | POA: Diagnosis present

## 2023-07-24 DIAGNOSIS — E669 Obesity, unspecified: Secondary | ICD-10-CM | POA: Diagnosis present

## 2023-07-24 DIAGNOSIS — I10 Essential (primary) hypertension: Secondary | ICD-10-CM | POA: Diagnosis present

## 2023-07-24 DIAGNOSIS — Z66 Do not resuscitate: Secondary | ICD-10-CM | POA: Diagnosis not present

## 2023-07-24 DIAGNOSIS — E1169 Type 2 diabetes mellitus with other specified complication: Secondary | ICD-10-CM

## 2023-07-24 DIAGNOSIS — E785 Hyperlipidemia, unspecified: Secondary | ICD-10-CM | POA: Diagnosis present

## 2023-07-24 DIAGNOSIS — I129 Hypertensive chronic kidney disease with stage 1 through stage 4 chronic kidney disease, or unspecified chronic kidney disease: Secondary | ICD-10-CM | POA: Diagnosis present

## 2023-07-24 DIAGNOSIS — K219 Gastro-esophageal reflux disease without esophagitis: Secondary | ICD-10-CM | POA: Diagnosis present

## 2023-07-24 DIAGNOSIS — Z79899 Other long term (current) drug therapy: Secondary | ICD-10-CM

## 2023-07-24 DIAGNOSIS — I4721 Torsades de pointes: Secondary | ICD-10-CM | POA: Diagnosis present

## 2023-07-24 DIAGNOSIS — Z96642 Presence of left artificial hip joint: Secondary | ICD-10-CM | POA: Diagnosis present

## 2023-07-24 DIAGNOSIS — Z833 Family history of diabetes mellitus: Secondary | ICD-10-CM

## 2023-07-24 DIAGNOSIS — R001 Bradycardia, unspecified: Secondary | ICD-10-CM | POA: Diagnosis present

## 2023-07-24 DIAGNOSIS — N390 Urinary tract infection, site not specified: Secondary | ICD-10-CM | POA: Diagnosis present

## 2023-07-24 DIAGNOSIS — I2510 Atherosclerotic heart disease of native coronary artery without angina pectoris: Secondary | ICD-10-CM | POA: Diagnosis present

## 2023-07-24 DIAGNOSIS — R410 Disorientation, unspecified: Secondary | ICD-10-CM | POA: Diagnosis not present

## 2023-07-24 DIAGNOSIS — Z86711 Personal history of pulmonary embolism: Secondary | ICD-10-CM

## 2023-07-24 DIAGNOSIS — E162 Hypoglycemia, unspecified: Secondary | ICD-10-CM

## 2023-07-24 LAB — TSH: TSH: 7.432 u[IU]/mL — ABNORMAL HIGH (ref 0.350–4.500)

## 2023-07-24 LAB — URINALYSIS, ROUTINE W REFLEX MICROSCOPIC
Bilirubin Urine: NEGATIVE
Glucose, UA: NEGATIVE mg/dL
Ketones, ur: NEGATIVE mg/dL
Nitrite: NEGATIVE
Protein, ur: NEGATIVE mg/dL
RBC / HPF: >50 RBC/hpf (ref 0–5)
Specific Gravity, Urine: 1.012 (ref 1.005–1.030)
WBC, UA: >50 WBC/hpf (ref 0–5)
pH: 5 (ref 5.0–8.0)

## 2023-07-24 LAB — CBG MONITORING, ED
Glucose-Capillary: 58 mg/dL — ABNORMAL LOW (ref 70–99)
Glucose-Capillary: 62 mg/dL — ABNORMAL LOW (ref 70–99)
Glucose-Capillary: 73 mg/dL (ref 70–99)
Glucose-Capillary: 87 mg/dL (ref 70–99)

## 2023-07-24 LAB — COMPREHENSIVE METABOLIC PANEL
ALT: 49 U/L — ABNORMAL HIGH (ref 0–44)
AST: 128 U/L — ABNORMAL HIGH (ref 15–41)
Albumin: 2 g/dL — ABNORMAL LOW (ref 3.5–5.0)
Alkaline Phosphatase: 39 U/L (ref 38–126)
Anion gap: 7 (ref 5–15)
BUN: 23 mg/dL (ref 8–23)
CO2: 23 mmol/L (ref 22–32)
Calcium: 8.2 mg/dL — ABNORMAL LOW (ref 8.9–10.3)
Chloride: 108 mmol/L (ref 98–111)
Creatinine, Ser: 0.82 mg/dL (ref 0.44–1.00)
GFR, Estimated: >60 mL/min (ref >60)
Glucose, Bld: 67 mg/dL — ABNORMAL LOW (ref 70–99)
Potassium: 4.2 mmol/L (ref 3.5–5.1)
Sodium: 138 mmol/L (ref 135–145)
Total Bilirubin: 0.6 mg/dL (ref 0.3–1.2)
Total Protein: 4.9 g/dL — ABNORMAL LOW (ref 6.5–8.1)

## 2023-07-24 LAB — AMMONIA: Ammonia: <10 umol/L (ref 9–35)

## 2023-07-24 LAB — CBC
HCT: 28.4 % — ABNORMAL LOW (ref 36.0–46.0)
Hemoglobin: 9.1 g/dL — ABNORMAL LOW (ref 12.0–15.0)
MCH: 31 pg (ref 26.0–34.0)
MCHC: 32 g/dL (ref 30.0–36.0)
MCV: 96.6 fL (ref 80.0–100.0)
Platelets: 264 10*3/uL (ref 150–400)
RBC: 2.94 MIL/uL — ABNORMAL LOW (ref 3.87–5.11)
RDW: 16.7 % — ABNORMAL HIGH (ref 11.5–15.5)
WBC: 6.3 10*3/uL (ref 4.0–10.5)
nRBC: 0 % (ref 0.0–0.2)

## 2023-07-24 LAB — SARS CORONAVIRUS 2 BY RT PCR: SARS Coronavirus 2 by RT PCR: POSITIVE — AB

## 2023-07-24 MED ORDER — DEXTROSE 50 % IV SOLN
25.0000 mL | Freq: Once | INTRAVENOUS | Status: AC
  Administered 2023-07-24: 25 mL via INTRAVENOUS
  Filled 2023-07-24: qty 50

## 2023-07-24 MED ORDER — SODIUM CHLORIDE 0.9 % IV SOLN
1.0000 g | Freq: Once | INTRAVENOUS | Status: AC
  Administered 2023-07-24: 1 g via INTRAVENOUS
  Filled 2023-07-24: qty 10

## 2023-07-24 MED ORDER — SODIUM CHLORIDE 0.9 % IV BOLUS
1000.0000 mL | Freq: Once | INTRAVENOUS | Status: AC
  Administered 2023-07-24: 1000 mL via INTRAVENOUS

## 2023-07-24 NOTE — ED Notes (Signed)
Lab called to add on urine culture. Verbal confirmation received that urine culture is in process at this time.

## 2023-07-24 NOTE — ED Notes (Signed)
Patient transported to CT 

## 2023-07-24 NOTE — ED Triage Notes (Signed)
Pt is having moment of forgetfulness. Pt has not been formerly diagnosed with dementia. Pt was diagnosed with COVID yesterday. Pt has no complaints.

## 2023-07-25 ENCOUNTER — Observation Stay (HOSPITAL_COMMUNITY): Payer: Medicare HMO

## 2023-07-25 DIAGNOSIS — E8809 Other disorders of plasma-protein metabolism, not elsewhere classified: Secondary | ICD-10-CM | POA: Diagnosis present

## 2023-07-25 DIAGNOSIS — T83518A Infection and inflammatory reaction due to other urinary catheter, initial encounter: Secondary | ICD-10-CM | POA: Diagnosis present

## 2023-07-25 DIAGNOSIS — L89152 Pressure ulcer of sacral region, stage 2: Secondary | ICD-10-CM | POA: Diagnosis present

## 2023-07-25 DIAGNOSIS — E43 Unspecified severe protein-calorie malnutrition: Secondary | ICD-10-CM | POA: Diagnosis present

## 2023-07-25 DIAGNOSIS — E669 Obesity, unspecified: Secondary | ICD-10-CM | POA: Diagnosis present

## 2023-07-25 DIAGNOSIS — N390 Urinary tract infection, site not specified: Secondary | ICD-10-CM

## 2023-07-25 DIAGNOSIS — N1832 Chronic kidney disease, stage 3b: Secondary | ICD-10-CM | POA: Diagnosis present

## 2023-07-25 DIAGNOSIS — I071 Rheumatic tricuspid insufficiency: Secondary | ICD-10-CM | POA: Diagnosis present

## 2023-07-25 DIAGNOSIS — U071 COVID-19: Secondary | ICD-10-CM | POA: Diagnosis present

## 2023-07-25 DIAGNOSIS — I129 Hypertensive chronic kidney disease with stage 1 through stage 4 chronic kidney disease, or unspecified chronic kidney disease: Secondary | ICD-10-CM | POA: Diagnosis present

## 2023-07-25 DIAGNOSIS — I4721 Torsades de pointes: Secondary | ICD-10-CM | POA: Diagnosis present

## 2023-07-25 DIAGNOSIS — E785 Hyperlipidemia, unspecified: Secondary | ICD-10-CM | POA: Diagnosis present

## 2023-07-25 DIAGNOSIS — J45909 Unspecified asthma, uncomplicated: Secondary | ICD-10-CM | POA: Diagnosis present

## 2023-07-25 DIAGNOSIS — Z66 Do not resuscitate: Secondary | ICD-10-CM | POA: Diagnosis not present

## 2023-07-25 DIAGNOSIS — E1122 Type 2 diabetes mellitus with diabetic chronic kidney disease: Secondary | ICD-10-CM | POA: Diagnosis present

## 2023-07-25 DIAGNOSIS — J9811 Atelectasis: Secondary | ICD-10-CM | POA: Diagnosis present

## 2023-07-25 DIAGNOSIS — E11649 Type 2 diabetes mellitus with hypoglycemia without coma: Secondary | ICD-10-CM

## 2023-07-25 DIAGNOSIS — E039 Hypothyroidism, unspecified: Secondary | ICD-10-CM | POA: Diagnosis present

## 2023-07-25 DIAGNOSIS — R410 Disorientation, unspecified: Secondary | ICD-10-CM | POA: Diagnosis present

## 2023-07-25 DIAGNOSIS — D631 Anemia in chronic kidney disease: Secondary | ICD-10-CM | POA: Diagnosis present

## 2023-07-25 DIAGNOSIS — E871 Hypo-osmolality and hyponatremia: Secondary | ICD-10-CM | POA: Diagnosis present

## 2023-07-25 DIAGNOSIS — Z515 Encounter for palliative care: Secondary | ICD-10-CM | POA: Diagnosis not present

## 2023-07-25 DIAGNOSIS — E1151 Type 2 diabetes mellitus with diabetic peripheral angiopathy without gangrene: Secondary | ICD-10-CM | POA: Diagnosis present

## 2023-07-25 DIAGNOSIS — Y846 Urinary catheterization as the cause of abnormal reaction of the patient, or of later complication, without mention of misadventure at the time of the procedure: Secondary | ICD-10-CM | POA: Diagnosis present

## 2023-07-25 DIAGNOSIS — G9341 Metabolic encephalopathy: Secondary | ICD-10-CM | POA: Diagnosis present

## 2023-07-25 LAB — CBG MONITORING, ED
Glucose-Capillary: 67 mg/dL — ABNORMAL LOW (ref 70–99)
Glucose-Capillary: 79 mg/dL (ref 70–99)
Glucose-Capillary: 99 mg/dL (ref 70–99)
Glucose-Capillary: 112 mg/dL — ABNORMAL HIGH (ref 70–99)

## 2023-07-25 LAB — T4, FREE: Free T4: 1.17 ng/dL — ABNORMAL HIGH (ref 0.61–1.12)

## 2023-07-25 MED ORDER — DEXTROSE 50 % IV SOLN: 25.0000 mL | Freq: Once | INTRAVENOUS | Status: AC

## 2023-07-25 MED ORDER — PANTOPRAZOLE SODIUM 40 MG PO TBEC
40.0000 mg | DELAYED_RELEASE_TABLET | Freq: Every day | ORAL | Status: DC
  Administered 2023-07-26 – 2023-08-06 (×8): 40 mg via ORAL
  Filled 2023-07-25 (×10): qty 1

## 2023-07-25 MED ORDER — ONDANSETRON HCL 4 MG/2ML IJ SOLN
4.0000 mg | Freq: Four times a day (QID) | INTRAMUSCULAR | Status: DC | PRN
  Administered 2023-07-29: 4 mg via INTRAVENOUS
  Filled 2023-07-25: qty 2

## 2023-07-25 MED ORDER — SENNOSIDES-DOCUSATE SODIUM 8.6-50 MG PO TABS
2.0000 | ORAL_TABLET | Freq: Every day | ORAL | Status: DC
  Administered 2023-07-27 – 2023-08-03 (×2): 2 via ORAL
  Filled 2023-07-25 (×6): qty 2

## 2023-07-25 MED ORDER — POTASSIUM CHLORIDE 2 MEQ/ML IV SOLN
INTRAVENOUS | Status: DC
  Filled 2023-07-25 (×24): qty 1000

## 2023-07-25 MED ORDER — POTASSIUM CHLORIDE 2 MEQ/ML IV SOLN
INTRAVENOUS | Status: DC
  Filled 2023-07-25: qty 990

## 2023-07-25 MED ORDER — DEXTROSE 50 % IV SOLN
INTRAVENOUS | Status: AC
  Administered 2023-07-25: 25 mL via INTRAVENOUS
  Filled 2023-07-25: qty 50

## 2023-07-25 MED ORDER — POLYETHYLENE GLYCOL 3350 17 G PO PACK
17.0000 g | PACK | Freq: Every day | ORAL | Status: DC
  Administered 2023-07-26 – 2023-08-03 (×5): 17 g via ORAL
  Filled 2023-07-25 (×5): qty 1

## 2023-07-25 MED ORDER — SODIUM CHLORIDE 0.9 % IV SOLN
1.0000 g | INTRAVENOUS | Status: DC
  Administered 2023-07-25 – 2023-07-26 (×2): 1 g via INTRAVENOUS
  Filled 2023-07-25 (×2): qty 10

## 2023-07-25 MED ORDER — ACETAMINOPHEN 650 MG RE SUPP: 650.0000 mg | Freq: Four times a day (QID) | RECTAL | Status: DC | PRN

## 2023-07-25 MED ORDER — ONDANSETRON HCL 4 MG PO TABS: 4.0000 mg | ORAL_TABLET | Freq: Four times a day (QID) | ORAL | Status: DC | PRN

## 2023-07-25 MED ORDER — ACETAMINOPHEN 325 MG PO TABS
650.0000 mg | ORAL_TABLET | Freq: Four times a day (QID) | ORAL | Status: DC | PRN
  Administered 2023-07-27 – 2023-08-05 (×4): 650 mg via ORAL
  Filled 2023-07-25 (×5): qty 2

## 2023-07-25 MED ORDER — CHLORHEXIDINE GLUCONATE CLOTH 2 % EX PADS
6.0000 | MEDICATED_PAD | Freq: Every day | CUTANEOUS | Status: DC
  Administered 2023-07-25 – 2023-08-06 (×11): 6 via TOPICAL

## 2023-07-25 MED ORDER — CARVEDILOL 6.25 MG PO TABS
6.2500 mg | ORAL_TABLET | Freq: Every morning | ORAL | Status: DC
  Administered 2023-07-26: 6.25 mg via ORAL
  Filled 2023-07-25: qty 1

## 2023-07-25 MED ORDER — LEVOTHYROXINE SODIUM 50 MCG PO TABS
50.0000 ug | ORAL_TABLET | Freq: Every day | ORAL | Status: DC
  Administered 2023-07-26 – 2023-08-06 (×8): 50 ug via ORAL
  Filled 2023-07-25 (×10): qty 1

## 2023-07-25 MED ORDER — ENOXAPARIN SODIUM 40 MG/0.4ML IJ SOSY
40.0000 mg | PREFILLED_SYRINGE | INTRAMUSCULAR | Status: DC
  Administered 2023-07-25 – 2023-08-06 (×12): 40 mg via SUBCUTANEOUS
  Filled 2023-07-25 (×13): qty 0.4

## 2023-07-25 NOTE — ED Notes (Addendum)
Patient repositioned/turned in bed; pillow placed under right hip. Patient brief checked at this time and was clean and dry. Call bell in reach.

## 2023-07-25 NOTE — H&P (Signed)
History and Physical    Lucindy Koeneman ZOX:[id number] DOB: Sep 14, 1946 DOA: 07/24/2023  DOS: the patient was seen and examined on 07/24/2023  PCP: Darrow Bussing, MD   Patient coming from: SNF. 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  She presents back from the facility today due to reported confusion.  Patient has no diagnosis of dementia.  She was reportedly diagnosed with COVID yesterday at the facility.  On arrival temp 99.5 heart rate 75 blood pressure 147/91 satting 97% on room air.  Initial glucose was 58  UA showed positive leukocyte Estrace greater than 50 bacteria  White count 6.3, hemoglobin 9.1, platelets of 264  Sodium 138, potassium 4.2, chloride 108, bicarb 23, BUN 23, creatinine 0.8, serum glucose of 67  Total protein 4.9, albumin 2.0, AST 128, ALT of 49, alk phos of 39  TSH high at 7.4 Ammonia less than 10  Patient given a half amp of D50.  Repeat blood sugars dropped back down to 62  Second half amp of D50 ordered.   CT head negative for acute intracranial process.  Chest x-ray shows cardiomegaly, atelectasis at the bases.  [facility] hospitalist consulted.     ED Course: +Covid, WBC 6.3, repeated hypoglycemia.  Review of Systems:  Review of Systems  Unable to perform ROS: Mental acuity    Past Medical History:  Diagnosis Date   Anemia    as a child   Anginal pain (HCC)    Asthma    as a child   Bladder incontinence    Chronic back pain    spinal stenosis and buldging  disc;scoliosis   Chronic constipation    occasionally takes something OTC   Chronic kidney disease    Diabetes mellitus without complication (HCC)    per pt "Pre" for 20+yrs   Diverticulosis    DVT (deep venous thrombosis) (HCC)    Dyspnea    occasional   Gallstones    has known about this for 3-66yrs   GERD (gastroesophageal reflux disease)    takes Omeprazole daily   Glaucoma    H/O hiatal hernia    Heart murmur    Hemorrhoids    History of blood transfusion    no abnormal reaction noted   History of bronchitis    states its been a long time ago   History of gout    HLD (hyperlipidemia)    takes Fenofibrate daily   HTN (hypertension)    takes Diltiazem and Ramipril daily   Hypothyroidism    takes Synthroid daily   Left knee DJD    Macular degeneration    Nocturia    Palpitations    started in 2013 and only occasionally;last time noticed about 2-3wks ago and after drinking caffeine   Peripheral vascular disease (HCC)    PONV (postoperative nausea and vomiting)    problems swallowing after anethesia- 2/2023and slurred speech also   Pulmonary embolism with acute cor pulmonale (HCC) 07/2018   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   Sleep apnea  does not uses cpap    Past Surgical History:  Procedure Laterality Date   3-day EVENT MONITOR  01/2019   Mostly normal monitor.  Predominantly sinus rhythm (rate range 48-105 bpm, average 67 bpm) 9 short atrial runs (fastest = 13 beats rate 135 bpm, longest ~17 seconds-101 bpm) -> not noted on symptom log.  No sustained arrhythmias (tachycardia or bradycardia), or pauses..  Symptoms are noted with PVCs.   bilateral cataract surgery      CHOLECYSTECTOMY N/A 01/10/2022   Procedure: LAPAROSCOPIC CHOLECYSTECTOMY WITH INTRAOPERATIVE CHOLANGIOGRAM;  Surgeon: Karie Soda, MD;  Location: [facility] ORS;  Service: General;  Laterality: N/A;   COLONOSCOPY     CT ANGIOGRAM OF  CHEST - PE PROTOCOL  07/2018   Bilateral nonocclusive pulmonary emboli evidence of right heart strain consistent with "submassive "/intermediate PE.  -->  Code PE called.   DILATION AND CURETTAGE OF UTERUS     multiple about 6-7 times   ESOPHAGOGASTRODUODENOSCOPY     FLEXIBLE SIGMOIDOSCOPY N/A 07/09/2023   Procedure: FLEXIBLE SIGMOIDOSCOPY;  Surgeon: Charlott Rakes, MD;  Location: [facility];  Service: Gastroenterology;  Laterality: N/A;   NM VQ LUNG SCAN ([facility] HX)  11/23/2018   Low probability for PE   REVERSE SHOULDER ARTHROPLASTY Right 06/17/2023   Procedure: RIGHT REVERSE SHOULDER ARTHROPLASTY;  Surgeon: Teryl Lucy, MD;  Location: [facility];  Service: Orthopedics;  Laterality: Right;   RIGHT/LEFT HEART CATH AND CORONARY ANGIOGRAPHY N/A 08/19/2018   Procedure: RIGHT/LEFT HEART CATH AND CORONARY ANGIOGRAPHY;  Surgeon: Corky Crafts, MD;  Location: [facility] INVASIVE CV LAB;  Service: Cardiovascular:: Nonobstructive CAD.  Mild pulmonary pretension.  Mean PA pressure 26 mmHg with PA P 48/20 mmHg.  Normal LVEDP.   THYROID SURGERY Right 2010   TOTAL HIP ARTHROPLASTY Left 2005   TOTAL KNEE ARTHROPLASTY Left 02/21/2014   Procedure: TOTAL KNEE ARTHROPLASTY;  Surgeon: Nilda Simmer, MD;  Location: [facility] OR;  Service: Orthopedics;  Laterality: Left;   TRANSTHORACIC ECHOCARDIOGRAM  07/2018    (In setting of acute PE) mild LVH.  EF 55 to 60%.  GR 1 DD.  Aortic sclerosis but no stenosis.  Mild regurgitation.  Mild RA dilation.  Moderate LA dilation.  Moderate tricuspid regurgitation with severe primary hypertension estimated PA pressure 71 mmHg.  RV poorly visualized   TRANSTHORACIC ECHOCARDIOGRAM  10/2018 - 11/2019   a) EF 60-65%.  GR 1 DD.  Focal basal LVH. Mod AI/ no AS. Mild LA dilation.  Normal RV size and fxn.  Mod PI w/ mild TR.  Peak PAP ~ 38 mmHg; b) Normal EF 60 to 65%.  Moderate LVH-GR 1 DD.  R WMA.  Mild AS w/ trivial AI.  Grossly normal PA, RV and RA size.  Normal RV function.  Normal PA  pressures.   TRANSTHORACIC ECHOCARDIOGRAM  01/2021    EF 55%.  Normal LV function.  Mild LVH.  GR 1 DD.  Normal PA pressures.  Severe LA dilation.  (Consistent with more significant diastolic dysfunction).  Mild-possibly mild to moderate aortic insufficiency.  Borderline elevated RAP.  Trivial pericardial effusion.  (Stable)   VAGINAL HYSTERECTOMY  1979     reports that she quit smoking about 29 years ago. Her smoking use included cigarettes. She started smoking about 58 years ago. She has a 14.6 pack-year smoking history. She has never used smokeless tobacco. She reports that she does not currently use alcohol. She reports that she does not use drugs.  Allergies  Allergen Reactions   Atorvastatin  Other (See Comments)    Muscle pain   Codeine Nausea And Vomiting   Morphine And Codeine     B/p drops     Family History  Problem Relation Age of Onset   Uterine cancer Mother    Heart disease Mother    Hypertension Mother    Diabetes Mother    Cirrhosis Father    Pneumonia Father    Lymphoma Sister        ,non hodgkin    Diabetes Sister    Breast cancer Neg Hx     Prior to Admission medications   Medication Sig Start Date End Date Taking? Authorizing Provider  acetaminophen (TYLENOL) 650 MG CR tablet Take 650 mg by mouth every 8 (eight) hours as needed for pain.    [provider]  albuterol (VENTOLIN HFA) 108 (90 Base) MCG/ACT inhaler Inhale 2 puffs into the lungs every 6 (six) hours as needed. 07/02/19   Coral Ceo, NP  bimatoprost (LUMIGAN) 0.01 % SOLN Place 1 drop into both eyes at bedtime. 02/06/21   [provider]  carvedilol (COREG) 6.25 MG tablet Take 6.25 mg by mouth in the morning.    [provider]  Cholecalciferol (VITAMIN D3 PO) Take 1 tablet by mouth daily.    [provider]  diclofenac Sodium (VOLTAREN) 1 % GEL Apply 2 g topically daily as needed (pain).    [provider]  enoxaparin (LOVENOX) 40 MG/0.4ML injection  Inject 0.4 mLs (40 mg total) into the skin daily. 07/16/23   Dorcas Carrow, MD  feeding supplement (ENSURE ENLIVE / ENSURE PLUS) LIQD Take 237 mLs by mouth 2 (two) times daily between meals. 07/16/23   Dorcas Carrow, MD  fenofibrate (TRICOR) 145 MG tablet Take 145 mg by mouth daily.    [provider]  HUMIRA PEN 40 MG/0.4ML PNKT Inject 40 mg into the skin every 14 (fourteen) days. 01/22/21   [provider]  hydrocortisone cream 1 % Apply 1 Application topically daily as needed for itching.    [provider]  ketorolac (ACULAR) 0.5 % ophthalmic solution Place 1 drop into both eyes in the morning, at noon, and at bedtime. Verified correct strength per Ophthalmalogist progress notes    [provider]  levothyroxine (SYNTHROID, LEVOTHROID) 50 MCG tablet Take 50 mcg by mouth daily before breakfast.    [provider]  lidocaine (LIDODERM) 5 % Place 1 patch onto the skin daily as needed (pain). 04/14/23   [provider]  liver oil-zinc oxide (DESITIN) 40 % ointment Apply 1 Application topically as needed for irritation.    [provider]  omeprazole (PRILOSEC) 20 MG capsule Take 20 mg by mouth daily.    [provider]  ondansetron (ZOFRAN) 4 MG tablet Take 1 tablet (4 mg total) by mouth every 8 (eight) hours as needed for nausea or vomiting. 06/19/23   Janine Ores K, PA-C  polyethylene glycol (MIRALAX / GLYCOLAX) 17 g packet Take 17 g by mouth daily. 07/16/23   Dorcas Carrow, MD  QUEtiapine (SEROQUEL) 25 MG tablet Take 25 mg by mouth at bedtime.    [provider]  rosuvastatin (CRESTOR) 20 MG tablet TAKE 1 TABLET BY MOUTH  DAILY AT 6 PM. Patient taking differently: Take 20 mg by mouth daily. 07/08/22   Marykay Lex, MD  sennosides-docusate sodium (SENOKOT-S) 8.6-50 MG tablet Take 2 tablets by mouth daily. 06/19/23   Armida Sans, PA-C  traMADol (ULTRAM) 50 MG tablet Take  1 tablet (50 mg total) by mouth every 4  (four) hours as needed for moderate pain or severe pain. 07/16/23   Dorcas Carrow, MD    Physical Exam: Vitals:   07/24/23 1900 07/24/23 2030 07/24/23 2045 07/24/23 2115  BP: (!) 166/84  (!) 160/71   Pulse:   76 77  Resp:  18 16 15   Temp:      TempSrc:      SpO2:   96% 98%  Weight:      Height:        Physical Exam Vitals and nursing note reviewed.  Constitutional:      Appearance: She is obese.  HENT:     Head: Normocephalic and atraumatic.     Nose: Nose normal.  Eyes:     General: No scleral icterus. Cardiovascular:     Rate and Rhythm: Normal rate and regular rhythm.  Pulmonary:     Effort: Pulmonary effort is normal.     Breath sounds: Normal breath sounds.  Abdominal:     General: Abdomen is protuberant. Bowel sounds are normal. There is no distension.     Palpations: Abdomen is soft.     Tenderness: There is no abdominal tenderness.  Genitourinary:    Comments: +foley catheter Musculoskeletal:     Right lower leg: 2+ Edema present.     Left lower leg: 2+ Edema present.     Comments: Non-pitting edema of bilateral lower legs  Skin:    General: Skin is warm and dry.     Capillary Refill: Capillary refill takes less than 2 seconds.  Neurological:     Mental Status: She is disoriented.      Labs on Admission: I have personally reviewed following labs and imaging studies  CBC: Recent Labs  Lab 07/24/23 2047  WBC 6.3  HGB 9.1*  HCT 28.4*  MCV 96.6  PLT 264   Basic Metabolic Panel: Recent Labs  Lab 07/24/23 2047  NA 138  K 4.2  CL 108  CO2 23  GLUCOSE 67*  BUN 23  CREATININE 0.82  CALCIUM 8.2*   GFR: Estimated Creatinine Clearance: 54.6 mL/min (by C-G formula based on SCr of 0.82 mg/dL). Liver Function Tests: Recent Labs  Lab 07/24/23 2047  AST 128*  ALT 49*  ALKPHOS 39  BILITOT 0.6  PROT 4.9*  ALBUMIN 2.0*    Recent Labs  Lab 07/24/23 2150  AMMONIA <10   CBG: Recent Labs  Lab 07/24/23 1925 07/24/23 2132 07/24/23 2207  07/24/23 2341  GLUCAP 58* 87 73 62*   Thyroid Function Tests: Recent Labs    07/24/23 2047  TSH 7.432*   Urine analysis:    Component Value Date/Time   COLORURINE YELLOW 07/24/2023 2029   APPEARANCEUR CLOUDY (A) 07/24/2023 2029   LABSPEC 1.012 07/24/2023 2029   PHURINE 5.0 07/24/2023 2029   GLUCOSEU NEGATIVE 07/24/2023 2029   HGBUR LARGE (A) 07/24/2023 2029   BILIRUBINUR NEGATIVE 07/24/2023 2029   KETONESUR NEGATIVE 07/24/2023 2029   PROTEINUR NEGATIVE 07/24/2023 2029   UROBILINOGEN 0.2 02/14/2014 1156   NITRITE NEGATIVE 07/24/2023 2029   LEUKOCYTESUR LARGE (A) 07/24/2023 2029    Radiological Exams on Admission: I have personally reviewed images CT Head Wo Contrast  Result Date: 07/24/2023 CLINICAL DATA:  Altered level of consciousness, forgetfulness, recent COVID diagnosis EXAM: CT HEAD WITHOUT CONTRAST TECHNIQUE: Contiguous axial images were obtained from the base of the skull through the vertex without intravenous contrast. RADIATION DOSE REDUCTION: This exam was performed according to  the departmental dose-optimization program which includes automated exposure control, adjustment of the mA and/or kV according to patient size and/or use of iterative reconstruction technique. COMPARISON:  01/11/2022 FINDINGS: Brain: Chronic small vessel ischemic changes are seen within the periventricular white matter and bilateral basal ganglia. No acute infarct or hemorrhage. Lateral ventricles and remaining midline structures are unremarkable. No acute extra-axial fluid collections. No mass effect. Vascular: Stable atherosclerosis.  No hyperdense vessel. Skull: Normal. Negative for fracture or focal lesion. Sinuses/Orbits: No acute finding. Other: None. IMPRESSION: 1. No acute intracranial process. Electronically Signed   By: Sharlet Salina M.D.   On: 07/24/2023 21:24   DG Chest Port 1 View  Result Date: 07/24/2023 CLINICAL DATA:  Cough. Forgetfulness. Diagnosed with COVID yesterday. EXAM:  PORTABLE CHEST 1 VIEW COMPARISON:  07/04/2023 FINDINGS: Shallow inspiration. Cardiac enlargement. Suggestion of infiltration or atelectasis in both lung bases, greater on the left. Possible small left pleural effusion. No pneumothorax. Degenerative changes in the spine and left shoulder. Postoperative change in the right shoulder. IMPRESSION: Cardiac enlargement. Shallow inspiration with infiltration or atelectasis in the lung bases, greater on the left. Possible small left pleural effusion. Electronically Signed   By: Burman Nieves M.D.   On: 07/24/2023 20:11    EKG: My personal interpretation of EKG shows: sinus tachycardia    Assessment/Plan Principal Problem:   COVID-19 virus infection Active Problems:   Acute metabolic encephalopathy   Edema due to hypoalbuminemia   Diabetic hypoglycemia (HCC)   Catheter-associated urinary tract infection (HCC)   Essential hypertension   Hypothyroidism   Morbid obesity (HCC)   Type 2 diabetes mellitus (HCC)   History of pulmonary embolism   Obstructive sleep apnea   Chronic kidney disease, stage 3b (HCC)   Chronic constipation    Assessment and Plan: * COVID-19 virus infection Observation med/surg bed. Not hypoxic. No fevers. Will hold off on paxlovid for now.  Catheter-associated urinary tract infection (HCC) Start IV rocephin.  Diabetic hypoglycemia [facility]) May be one of the etiologies of her acute metabolic encephalopathy. Has required 2 doses of 25 g of IV D50. Will start low dose D10 gtts. Hold DM meds.  Edema due to hypoalbuminemia Pt with low total protein and albumin. If needing to diurese, would add bumex or demadex.  Can also give IV albumin along with IV lasix.  Acute metabolic encephalopathy Probably due to covid and diabetic hypoglycemic, uti.  Chronic constipation Continue senna and miralax. Hx of hemorrhoids with rectal bleeding in the past when she gets constipated.  Chronic kidney disease, stage 3b (HCC) Chronic.  Baseline Scr 1.0  Obstructive sleep apnea Prn cpap  History of pulmonary embolism On DVT prophylaxis with 40 mg SQ lovenox. Per last discharge summary, pt intolerant to restarting Eliquis due to recurrent hematuria.  Type 2 diabetes mellitus (HCC) Hold DM meds due to hypoglycemia.  Morbid obesity (HCC) Chronic. BMI 32.8  Hypothyroidism Stable. Continue synthroid 50 mcg. Check FT4  Essential hypertension Continue coreg 6.25 mg qday.   DVT prophylaxis: Lovenox Code Status: Full Code. By default Family Communication: no family at bedside  Disposition Plan: return to SNF  Consults called: none  Admission status: Observation, Med-Surg   Carollee Herter, DO [facility] 07/25/2023, 12:25 AM

## 2023-07-25 NOTE — Evaluation (Signed)
Modified Barium Swallow Study  Patient Details  Name: Tricia Harrison MRN: [id number] Date of Birth: 1946-07-30  Today's Date: 07/25/2023  Modified Barium Swallow completed.  Full report located under Chart Review in the Imaging Section.  History of Present Illness 77 year old with history of chronic anemia, chronic back pain, chronic constipation, CKD stage IIIb, hypertension and hyperlipidemia, hypothyroidism, dementia, history of pulmonary embolism recently underwent right shoulder replacement, significant postoperative delirium and confusion and was ultimately sent to skilled nursing rehab, now admitted from skilled nursing facility with confusion found to have COVID and diabetic hypoglycemia.  Per MD note she also has a UTI.  Patient has undergone prior modified barium swallow studies x 2 and esophagram.  Most recent MBS was conducted 07/09/2023 and she was noted to have mild oropharyngeal dysphagia with impaired pharyngeal stripping and impaired tongue base retraction resulting in retention.  Requiring several swallows to diminish pharyngeal retention.  In addition prior esophagram shows patulous esophagus, esoph dysmotlity, small hiatal hernia and trace reflux.  Prior BSE with possible velopharyngeal dysfunction with hypernasality and complaints of nasal regugitation. MBS 01/14/22 without penetration or aspiration but possible decreased UES opening with barium pooled above UES.  Patient chest x-ray showed left more than right infiltrate.  Patient has undergone prior brain imaging and current CT scan was negative for acute change.  She does endorse worsening of her swallowing in the last 6 months.  Nurse reports she did not pass her Yale swallow screening and that family states patient has a chronic dysphagia.   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  Her motility deficits do not result in laryngeal penetration or aspiration but allow  significant retention.  Oral transiting continues to be impaired - more with increased viscocity.  Velopharyngeal function, pharyngeal stripping wave and tongue base retraction deficits allow pharyngeal retention that pt requires a multitude of swallows to clear (up to 4).  A-P view challenging as pt was leaning to the right significantly.  Various postures including head turn left and chin tuck did not improve oropharyngeal clearance.    Chopped meats, Dys 3. Continue thin liquids, multiple swallows and alternate liquids and solids.  Did not test a barium tablet - but recommend meds with applesauce - whole if small, consider crushed if large.  SLP questions source of pt's pharyngeal dysphagia - however prior brain imaging showed Mild chronic small vessel ischemic disease. Small lacunar infarcts in the left basal ganglia- ? if this could be the source? Factors that may increase risk of adverse event in presence of aspiration Rubye Oaks & Clearance Coots 2021): Reduced saliva;Weak cough  Swallow Evaluation Recommendations Recommendations: PO diet PO Diet Recommendation: Dysphagia 3 (Mechanical soft);Thin liquids (Level 0) Liquid Administration via: Cup;Straw Medication Administration: Crushed with puree Supervision: Patient able to self-feed;Intermittent supervision/cueing for swallowing strategies Swallowing strategies  : Multiple dry swallows after each bite/sip;Small bites/sips;Slow rate Postural changes: Position pt fully upright for meals Oral care recommendations: Oral care BID (2x/day) Caregiver Recommendations: Have oral suction available   Rolena Infante, MS [facility] SLP Acute Rehab Services Office [phone]    Chales Abrahams 07/25/2023,5:08 PM

## 2023-07-25 NOTE — ED Provider Notes (Signed)
EMERGENCY DEPARTMENT AT [facility] Provider Note  CSN: [id number] Arrival date & time: 07/24/23 1802  Chief Complaint(s) Altered Mental Status  HPI Tricia Harrison is a 77 y.o. female history of incontinence with chronic indwelling Foley, diabetes, hypertension, hyperlipidemia, recent shoulder replacement with hospitalization for postoperative confusion, discharged to SNF presenting to the emergency department with worsening confusion.  Patient was apparently diagnosed with COVID yesterday.  The patient tells me that she is here because she "does not want to have anymore children".  She is overall pleasant but not able to provide much additional history.  She denies really any focal medical complaints such as chest pain, fevers or chills, abdominal pain, nausea or vomiting, headaches, difficulty breathing, or any other symptoms.  History limited due to altered mental status.   Past Medical History Past Medical History:  Diagnosis Date   Anemia    as a child   Anginal pain (HCC)    Asthma    as a child   Bladder incontinence    Chronic back pain    spinal stenosis and buldging disc;scoliosis   Chronic constipation    occasionally takes something OTC   Chronic kidney disease    Diabetes mellitus without complication (HCC)    per pt "Pre" for 20+yrs   Diverticulosis    DVT (deep venous thrombosis) (HCC)    Dyspnea    occasional   Gallstones    has known about this for 3-89yrs   GERD (gastroesophageal reflux disease)    takes Omeprazole daily   Glaucoma    H/O hiatal hernia    Heart murmur    Hemorrhoids    History of blood transfusion    no abnormal reaction noted   History of bronchitis    states its been a long time ago   History of gout    HLD (hyperlipidemia)    takes Fenofibrate daily   HTN (hypertension)    takes Diltiazem and Ramipril daily   Hypothyroidism    takes Synthroid daily   Left knee DJD    Macular degeneration    Nocturia     Palpitations    started in 2013 and only occasionally;last time noticed about 2-3wks ago and after drinking caffeine   Peripheral vascular disease (HCC)    PONV (postoperative nausea and vomiting)    problems swallowing after anethesia- 2/2023and slurred speech also   Pulmonary embolism with acute cor pulmonale (HCC) 07/2018   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   Anemia 06/16/2023   Chronic constipation 02/04/2022   Uses wheelchair 01/14/2022   Velopharyngeal dysfunction with mild swallowing difficulty 01/14/2022   Uses roller walker 01/11/2022   Atherosclerosis of aorta (HCC) 01/11/2022   Chronic kidney disease, stage 3b (HCC) 01/11/2022   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   Orthostasis 01/11/2022   Chronic cholecystitis 01/10/2022   Obstructive sleep apnea 03/09/2019   Aortic  regurgitation 12/02/2018   History of pulmonary embolism 08/20/2018   DOE (dyspnea on exertion)    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   Bilateral ovarian cysts    Hernia, hiatal    Renal cysts, acquired, bilateral    GERD (gastroesophageal reflux disease)    Diverticulosis    Lumbar spinal stenosis    Type 2 diabetes mellitus (HCC)    Left knee DJD    Rapid palpitations 12/06/2013   Morbid obesity (HCC) 12/06/2013   Fatigue 12/06/2013   PVC (premature ventricular contraction) 12/06/2013   Essential  hypertension    Hypothyroidism    Hyperlipidemia LDL goal <100    Home Medication(s) Prior to Admission medications   Medication Sig Start Date End Date Taking? Authorizing Provider  acetaminophen (TYLENOL) 650 MG CR tablet Take 650 mg by mouth every 8 (eight) hours as needed for pain.   Yes [provider]  albuterol (VENTOLIN HFA) 108 (90 Base) MCG/ACT inhaler Inhale 2 puffs into the lungs every 6 (six) hours as needed. 07/02/19  Yes Coral Ceo, NP  benzonatate (TESSALON) 100 MG capsule Take by mouth 3 (three) times daily.   Yes [provider]  bimatoprost (LUMIGAN) 0.01 % SOLN Place 1 drop into both eyes at bedtime. 02/06/21  Yes [provider]  carvedilol (COREG) 6.25 MG tablet Take 6.25 mg by mouth in the morning.   Yes [provider]  Cholecalciferol (VITAMIN D3 PO) Take 25 mcg by mouth daily.   Yes [provider]  diclofenac Sodium (VOLTAREN) 1 % GEL Apply 2 g topically daily as needed (pain).   Yes [provider]  enoxaparin (LOVENOX) 40 MG/0.4ML injection Inject 0.4 mLs (40 mg total) into the skin daily. 07/16/23  Yes Dorcas Carrow, MD  feeding supplement (ENSURE ENLIVE / ENSURE PLUS) LIQD Take 237 mLs by mouth 2 (two) times daily between meals. Patient taking differently: Take 237 mLs by mouth 3 (three) times daily between meals. 07/16/23  Yes Dorcas Carrow, MD  fenofibrate (TRICOR) 145 MG tablet Take 145 mg by mouth daily.   Yes [provider]  Guaifenesin (MUCINEX MAXIMUM STRENGTH) 1200 MG TB12 Take 1 tablet by mouth in the morning and at bedtime.   Yes [provider]  hydrocortisone cream 1 % Apply 1 Application topically daily as needed for itching.   Yes [provider]  ketorolac (ACULAR) 0.5 % ophthalmic solution Place 1 drop into both eyes in the morning, at noon, and at bedtime. Verified correct strength per Ophthalmalogist progress notes   Yes [provider]  levothyroxine  (SYNTHROID, LEVOTHROID) 50 MCG tablet Take 50 mcg by mouth daily before breakfast.   Yes [provider]  lidocaine (LIDODERM) 5 % Place 1 patch onto the skin daily as needed (pain). 04/14/23  Yes [provider]  liver oil-zinc oxide (DESITIN) 40 % ointment Apply 1 Application topically every 12 (twelve) hours as needed for irritation.   Yes [provider]  nystatin (MYCOSTATIN) 100000 UNIT/ML suspension Take 5 mLs by mouth 4 (four) times daily.   Yes [provider]  omeprazole (PRILOSEC) 40 MG capsule Take 40 mg by mouth daily.   Yes [provider]  ondansetron (ZOFRAN) 4 MG tablet Take 1 tablet (4 mg total) by mouth every 8 (eight) hours as needed for nausea or vomiting. 06/19/23  Yes Janine Ores K, PA-C  polyethylene glycol (MIRALAX / GLYCOLAX) 17 g packet Take 17 g by mouth daily. 07/16/23  Yes Ghimire,  Lyndel Safe, MD  QUEtiapine (SEROQUEL) 25 MG tablet Take 25 mg by mouth at bedtime.   Yes [provider]  rosuvastatin (CRESTOR) 20 MG tablet TAKE 1 TABLET BY MOUTH  DAILY AT 6 PM. Patient taking differently: Take 20 mg by mouth daily. 07/08/22  Yes Marykay Lex, MD  sennosides-docusate sodium (SENOKOT-S) 8.6-50 MG tablet Take 2 tablets by mouth daily. 06/19/23  Yes Janine Ores K, PA-C  traMADol (ULTRAM) 50 MG tablet Take 1 tablet (50 mg total) by mouth every 4 (four) hours as needed for moderate pain or severe pain. Patient taking differently: Take 50 mg by mouth in the morning, at noon, and at bedtime. 14 Days 07/16/23  Yes Dorcas Carrow, MD                                                                                                                                    Past Surgical History Past Surgical History:  Procedure Laterality Date   3-day EVENT MONITOR  01/2019   Mostly normal monitor.  Predominantly sinus rhythm (rate range 48-105 bpm, average 67 bpm) 9 short atrial runs (fastest = 13 beats rate 135 bpm, longest ~17 seconds-101  bpm) -> not noted on symptom log.  No sustained arrhythmias (tachycardia or bradycardia), or pauses..  Symptoms are noted with PVCs.   bilateral cataract surgery      CHOLECYSTECTOMY N/A 01/10/2022   Procedure: LAPAROSCOPIC CHOLECYSTECTOMY WITH INTRAOPERATIVE CHOLANGIOGRAM;  Surgeon: Karie Soda, MD;  Location: [facility];  Service: General;  Laterality: N/A;   COLONOSCOPY     CT ANGIOGRAM OF CHEST - PE PROTOCOL  07/2018   Bilateral nonocclusive pulmonary emboli evidence of right heart strain consistent with "submassive "/intermediate PE.  -->  Code PE called.   DILATION AND CURETTAGE OF UTERUS     multiple about 6-7 times   ESOPHAGOGASTRODUODENOSCOPY     FLEXIBLE SIGMOIDOSCOPY N/A 07/09/2023   Procedure: FLEXIBLE SIGMOIDOSCOPY;  Surgeon: Charlott Rakes, MD;  Location: [facility];  Service: Gastroenterology;  Laterality: N/A;   NM VQ LUNG SCAN ([facility] HX)  11/23/2018   Low probability for PE   REVERSE SHOULDER ARTHROPLASTY Right 06/17/2023   Procedure: RIGHT REVERSE SHOULDER ARTHROPLASTY;  Surgeon: Teryl Lucy, MD;  Location: [facility];  Service: Orthopedics;  Laterality: Right;   RIGHT/LEFT HEART CATH AND CORONARY ANGIOGRAPHY N/A 08/19/2018   Procedure: RIGHT/LEFT HEART CATH AND CORONARY ANGIOGRAPHY;  Surgeon: Corky Crafts, MD;  Location: [facility] INVASIVE CV LAB;  Service: Cardiovascular:: Nonobstructive CAD.  Mild pulmonary pretension.  Mean PA pressure 26 mmHg with PA P 48/20 mmHg.  Normal LVEDP.   THYROID SURGERY Right 2010   TOTAL HIP ARTHROPLASTY Left 2005   TOTAL KNEE ARTHROPLASTY Left 02/21/2014   Procedure: TOTAL KNEE ARTHROPLASTY;  Surgeon: Nilda Simmer, MD;  Location: [facility] OR;  Service: Orthopedics;  Laterality: Left;   TRANSTHORACIC ECHOCARDIOGRAM  07/2018    (In setting of acute PE) mild  LVH.  EF 55 to 60%.  GR 1 DD.  Aortic sclerosis but no stenosis.  Mild regurgitation.  Mild RA dilation.  Moderate LA dilation.  Moderate tricuspid regurgitation with severe primary  hypertension estimated PA pressure 71 mmHg.  RV poorly visualized   TRANSTHORACIC ECHOCARDIOGRAM  10/2018 - 11/2019   a) EF 60-65%.  GR 1 DD.  Focal basal LVH. Mod AI/ no AS. Mild LA dilation.  Normal RV size and fxn.  Mod PI w/ mild TR.  Peak PAP ~ 38 mmHg; b) Normal EF 60 to 65%.  Moderate LVH-GR 1 DD.  R WMA.  Mild AS w/ trivial AI.  Grossly normal PA, RV and RA size.  Normal RV function.  Normal PA pressures.   TRANSTHORACIC ECHOCARDIOGRAM  01/2021    EF 55%.  Normal LV function.  Mild LVH.  GR 1 DD.  Normal PA pressures.  Severe LA dilation.  (Consistent with more significant diastolic dysfunction).  Mild-possibly mild to moderate aortic insufficiency.  Borderline elevated RAP.  Trivial pericardial effusion.  (Stable)   VAGINAL HYSTERECTOMY  1979   Family History Family History  Problem Relation Age of Onset   Uterine cancer Mother    Heart disease Mother    Hypertension Mother    Diabetes Mother    Cirrhosis Father    Pneumonia Father    Lymphoma Sister        ,non hodgkin    Diabetes Sister    Breast cancer Neg Hx     Social History Social History   Tobacco Use   Smoking status: Former    Current packs/day: 0.00    Average packs/day: 0.5 packs/day for 29.2 years (14.6 ttl pk-yrs)    Types: Cigarettes    Start date: 09/04/1964    Quit date: 11/25/1993    Years since quitting: 29.6   Smokeless tobacco: Never   Tobacco comments:    quit smoking in 1995  Vaping Use   Vaping status: Never Used  Substance Use Topics   Alcohol use: Not Currently    Comment: Socially.   Drug use: No   Allergies Atorvastatin, Codeine, and Morphine and codeine  Review of Systems Review of Systems  All other systems reviewed and are negative.   Physical Exam Vital Signs  I have reviewed the triage vital signs BP 131/78   Pulse 72   Temp 98.5 F (36.9 C) (Oral)   Resp 18   Ht 5\' 1"  (1.549 m)   Wt 78.9 kg   SpO2 95%   BMI 32.88 kg/m  Physical Exam Vitals and nursing note  reviewed.  Constitutional:      General: She is not in acute distress.    Appearance: She is well-developed.  HENT:     Head: Normocephalic and atraumatic.     Mouth/Throat:     Mouth: Mucous membranes are moist.  Eyes:     Pupils: Pupils are equal, round, and reactive to light.  Cardiovascular:     Rate and Rhythm: Normal rate and regular rhythm.     Heart sounds: No murmur heard. Pulmonary:     Effort: Pulmonary effort is normal. No respiratory distress.     Breath sounds: Normal breath sounds.  Abdominal:     General: Abdomen is flat.     Palpations: Abdomen is soft.     Tenderness: There is no abdominal tenderness.  Musculoskeletal:        General: No tenderness.     Right lower leg: No  edema.     Left lower leg: No edema.  Skin:    General: Skin is warm and dry.  Neurological:     Mental Status: She is alert.     Comments: Cranial nerves II through XII intact, strength 5 out of 5 in the bilateral upper and lower extremities with no sensory deficits to light touch.  Patient oriented to self, not oriented to year, place, situation  Psychiatric:        Mood and Affect: Mood normal.        Behavior: Behavior normal.     ED Results and Treatments Labs (all labs ordered are listed, but only abnormal results are displayed) Labs Reviewed  SARS CORONAVIRUS 2 BY RT PCR - Abnormal; Notable for the following components:      Result Value   SARS Coronavirus 2 by RT PCR POSITIVE (*)    All other components within normal limits  COMPREHENSIVE METABOLIC PANEL - Abnormal; Notable for the following components:   Glucose, Bld 67 (*)    Calcium 8.2 (*)    Total Protein 4.9 (*)    Albumin 2.0 (*)    AST 128 (*)    ALT 49 (*)    All other components within normal limits  CBC - Abnormal; Notable for the following components:   RBC 2.94 (*)    Hemoglobin 9.1 (*)    HCT 28.4 (*)    RDW 16.7 (*)    All other components within normal limits  URINALYSIS, ROUTINE W REFLEX MICROSCOPIC  - Abnormal; Notable for the following components:   APPearance CLOUDY (*)    Hgb urine dipstick LARGE (*)    Leukocytes,Ua LARGE (*)    Bacteria, UA MANY (*)    All other components within normal limits  TSH - Abnormal; Notable for the following components:   TSH 7.432 (*)    All other components within normal limits  T4, FREE - Abnormal; Notable for the following components:   Free T4 1.17 (*)    All other components within normal limits  CBG MONITORING, ED - Abnormal; Notable for the following components:   Glucose-Capillary 58 (*)    All other components within normal limits  CBG MONITORING, ED - Abnormal; Notable for the following components:   Glucose-Capillary 62 (*)    All other components within normal limits  CBG MONITORING, ED - Abnormal; Notable for the following components:   Glucose-Capillary 67 (*)    All other components within normal limits  CBG MONITORING, ED - Abnormal; Notable for the following components:   Glucose-Capillary 112 (*)    All other components within normal limits  URINE CULTURE  AMMONIA  CBG MONITORING, ED  CBG MONITORING, ED  CBG MONITORING, ED  CBG MONITORING, ED                                                                                                                          Radiology CT Head Wo  Contrast  Result Date: 07/24/2023 CLINICAL DATA:  Altered level of consciousness, forgetfulness, recent COVID diagnosis EXAM: CT HEAD WITHOUT CONTRAST TECHNIQUE: Contiguous axial images were obtained from the base of the skull through the vertex without intravenous contrast. RADIATION DOSE REDUCTION: This exam was performed according to the departmental dose-optimization program which includes automated exposure control, adjustment of the mA and/or kV according to patient size and/or use of iterative reconstruction technique. COMPARISON:  01/11/2022 FINDINGS: Brain: Chronic small vessel ischemic changes are seen within the periventricular white  matter and bilateral basal ganglia. No acute infarct or hemorrhage. Lateral ventricles and remaining midline structures are unremarkable. No acute extra-axial fluid collections. No mass effect. Vascular: Stable atherosclerosis.  No hyperdense vessel. Skull: Normal. Negative for fracture or focal lesion. Sinuses/Orbits: No acute finding. Other: None. IMPRESSION: 1. No acute intracranial process. Electronically Signed   By: Sharlet Salina M.D.   On: 07/24/2023 21:24   DG Chest Port 1 View  Result Date: 07/24/2023 CLINICAL DATA:  Cough. Forgetfulness. Diagnosed with COVID yesterday. EXAM: PORTABLE CHEST 1 VIEW COMPARISON:  07/04/2023 FINDINGS: Shallow inspiration. Cardiac enlargement. Suggestion of infiltration or atelectasis in both lung bases, greater on the left. Possible small left pleural effusion. No pneumothorax. Degenerative changes in the spine and left shoulder. Postoperative change in the right shoulder. IMPRESSION: Cardiac enlargement. Shallow inspiration with infiltration or atelectasis in the lung bases, greater on the left. Possible small left pleural effusion. Electronically Signed   By: Burman Nieves M.D.   On: 07/24/2023 20:11    Pertinent labs & imaging results that were available during my care of the patient were reviewed by me and considered in my medical decision making (see MDM for details).  Medications Ordered in ED Medications  carvedilol (COREG) tablet 6.25 mg (0 mg Oral Hold 07/25/23 0643)  levothyroxine (SYNTHROID) tablet 50 mcg (0 mcg Oral Hold 07/25/23 0642)  pantoprazole (PROTONIX) EC tablet 40 mg (40 mg Oral Not Given 07/25/23 0903)  polyethylene glycol (MIRALAX / GLYCOLAX) packet 17 g (17 g Oral Not Given 07/25/23 0903)  senna-docusate (Senokot-S) tablet 2 tablet (2 tablets Oral Not Given 07/25/23 0903)  cefTRIAXone (ROCEPHIN) 1 g in sodium chloride 0.9 % 100 mL IVPB (has no administration in time range)  enoxaparin (LOVENOX) injection 40 mg (40 mg Subcutaneous Given  07/25/23 0911)  acetaminophen (TYLENOL) tablet 650 mg (has no administration in time range)    Or  acetaminophen (TYLENOL) suppository 650 mg (has no administration in time range)  ondansetron (ZOFRAN) tablet 4 mg (has no administration in time range)    Or  ondansetron (ZOFRAN) injection 4 mg (has no administration in time range)  dextrose 10 % 1,000 mL with potassium chloride 20 mEq infusion ( Intravenous Rate/Dose Verify 07/25/23 0902)  dextrose 50 % solution 25 mL (25 mLs Intravenous Given 07/24/23 2037)  sodium chloride 0.9 % bolus 1,000 mL (0 mLs Intravenous Stopped 07/24/23 2200)  cefTRIAXone (ROCEPHIN) 1 g in sodium chloride 0.9 % 100 mL IVPB (0 g Intravenous Stopped 07/24/23 2330)  dextrose 50 % solution 25 mL (25 mLs Intravenous Given 07/25/23 0028)  Procedures .Critical Care  Performed by: Lonell Grandchild, MD Authorized by: Lonell Grandchild, MD   Critical care provider statement:    Critical care time (minutes):  30   Critical care was necessary to treat or prevent imminent or life-threatening deterioration of the following conditions:  Metabolic crisis   Critical care was time spent personally by me on the following activities:  Development of treatment plan with patient or surrogate, discussions with consultants, evaluation of patient's response to treatment, examination of patient, ordering and review of laboratory studies, ordering and review of radiographic studies, ordering and performing treatments and interventions, pulse oximetry, re-evaluation of patient's condition and review of old charts   Care discussed with: admitting provider     (including critical care time)  Medical Decision Making / ED Course   MDM:  77 year old female presenting to the emergency department with altered mental status.  Patient is overall well-appearing,  vital signs reassuring, initial glucose was low and patient required glucose administration.  Mental status notable for confusion.  Differential causes of mental status include COVID infection, possible urinary infection although this is from her indwelling Foley's and may be contaminated, hypoglycemia due to underlying infection and diabetes.  Other workup obtained including laboratory testing without other causes of altered mental status elucidated such as hyponatremia or uremia, CT head which is unremarkable for acute process.  Physical exam is overall reassuring with no focal abdominal tenderness, meningismus, cough, or other process to suggest underlying alternative source of infection.  Patient required multiple doses of glucose for her hypoglycemia.  Discussed with the hospitalist Dr. Imogene Burn who will admit the patient.      Additional history obtained: -Additional history obtained from ems -External records from outside source obtained and reviewed including: Chart review including previous notes, labs, imaging, consultation notes including prior dc summary   Lab Tests: -I ordered, reviewed, and interpreted labs.   The pertinent results include:   Labs Reviewed  SARS CORONAVIRUS 2 BY RT PCR - Abnormal; Notable for the following components:      Result Value   SARS Coronavirus 2 by RT PCR POSITIVE (*)    All other components within normal limits  COMPREHENSIVE METABOLIC PANEL - Abnormal; Notable for the following components:   Glucose, Bld 67 (*)    Calcium 8.2 (*)    Total Protein 4.9 (*)    Albumin 2.0 (*)    AST 128 (*)    ALT 49 (*)    All other components within normal limits  CBC - Abnormal; Notable for the following components:   RBC 2.94 (*)    Hemoglobin 9.1 (*)    HCT 28.4 (*)    RDW 16.7 (*)    All other components within normal limits  URINALYSIS, ROUTINE W REFLEX MICROSCOPIC - Abnormal; Notable for the following components:   APPearance CLOUDY (*)    Hgb urine  dipstick LARGE (*)    Leukocytes,Ua LARGE (*)    Bacteria, UA MANY (*)    All other components within normal limits  TSH - Abnormal; Notable for the following components:   TSH 7.432 (*)    All other components within normal limits  T4, FREE - Abnormal; Notable for the following components:   Free T4 1.17 (*)    All other components within normal limits  CBG MONITORING, ED - Abnormal; Notable for the following components:   Glucose-Capillary 58 (*)    All other components within normal limits  CBG MONITORING, ED -  Abnormal; Notable for the following components:   Glucose-Capillary 62 (*)    All other components within normal limits  CBG MONITORING, ED - Abnormal; Notable for the following components:   Glucose-Capillary 67 (*)    All other components within normal limits  CBG MONITORING, ED - Abnormal; Notable for the following components:   Glucose-Capillary 112 (*)    All other components within normal limits  URINE CULTURE  AMMONIA  CBG MONITORING, ED  CBG MONITORING, ED  CBG MONITORING, ED  CBG MONITORING, ED    Notable for hypoglycemia, positive covid, possible UTI  EKG   EKG Interpretation Date/Time:  Thursday July 24 2023 20:05:58 EDT Ventricular Rate:  112 PR Interval:  195 QRS Duration:  109 QT Interval:  361 QTC Calculation: 493 R Axis:   -66  Text Interpretation: Sinus tachycardia Left anterior fascicular block Anteroseptal infarct, old Nonspecific T abnormalities, lateral leads Confirmed by Alvino Blood ([id number]) on 07/24/2023 11:05:12 PM         Imaging Studies ordered: I ordered imaging studies including CT head, CXR On my interpretation imaging demonstrates no acute process I independently visualized and interpreted imaging. I agree with the radiologist interpretation   Medicines ordered and prescription drug management: Meds ordered this encounter  Medications   dextrose 50 % solution 25 mL   sodium chloride 0.9 % bolus 1,000 mL    cefTRIAXone (ROCEPHIN) 1 g in sodium chloride 0.9 % 100 mL IVPB    Order Specific Question:   Antibiotic Indication:    Answer:   UTI   dextrose 50 % solution 25 mL   DISCONTD: potassium chloride 20 mEq/L in dextrose 10 % 1,000 mL Pediatric IV infusion   dextrose 50 % solution    [place]ch[place], Madeline T: cabinet override   carvedilol (COREG) tablet 6.25 mg   levothyroxine (SYNTHROID) tablet 50 mcg   pantoprazole (PROTONIX) EC tablet 40 mg   polyethylene glycol (MIRALAX / GLYCOLAX) packet 17 g   senna-docusate (Senokot-S) tablet 2 tablet   cefTRIAXone (ROCEPHIN) 1 g in sodium chloride 0.9 % 100 mL IVPB    Order Specific Question:   Antibiotic Indication:    Answer:   UTI   enoxaparin (LOVENOX) injection 40 mg   OR Linked Order Group    acetaminophen (TYLENOL) tablet 650 mg    acetaminophen (TYLENOL) suppository 650 mg   OR Linked Order Group    ondansetron (ZOFRAN) tablet 4 mg    ondansetron (ZOFRAN) injection 4 mg   dextrose 10 % 1,000 mL with potassium chloride 20 mEq infusion    -I have reviewed the patients home medicines and have made adjustments as needed   Consultations Obtained: I requested consultation with the hospitalist,  and discussed lab and imaging findings as well as pertinent plan - they recommend: admission   Cardiac Monitoring: The patient was maintained on a cardiac monitor.  I personally viewed and interpreted the cardiac monitored which showed an underlying rhythm of: NSR  Social Determinants of Health:  Diagnosis or treatment significantly limited by social determinants of health: obesity   Reevaluation: After the interventions noted above, I reevaluated the patient and found that their symptoms have stayed the same  Co morbidities that complicate the patient evaluation  Past Medical History:  Diagnosis Date   Anemia    as a child   Anginal pain (HCC)    Asthma    as a child   Bladder incontinence    Chronic back pain    spinal  stenosis and  buldging disc;scoliosis   Chronic constipation    occasionally takes something OTC   Chronic kidney disease    Diabetes mellitus without complication (HCC)    per pt "Pre" for 20+yrs   Diverticulosis    DVT (deep venous thrombosis) (HCC)    Dyspnea    occasional   Gallstones    has known about this for 3-55yrs   GERD (gastroesophageal reflux disease)    takes Omeprazole daily   Glaucoma    H/O hiatal hernia    Heart murmur    Hemorrhoids    History of blood transfusion    no abnormal reaction noted   History of bronchitis    states its been a long time ago   History of gout    HLD (hyperlipidemia)    takes Fenofibrate daily   HTN (hypertension)    takes Diltiazem and Ramipril daily   Hypothyroidism    takes Synthroid daily   Left knee DJD    Macular degeneration    Nocturia    Palpitations    started in 2013 and only occasionally;last time noticed about 2-3wks ago and after drinking caffeine   Peripheral vascular disease (HCC)    PONV (postoperative nausea and vomiting)    problems swallowing after anethesia- 2/2023and slurred speech also   Pulmonary embolism with acute cor pulmonale (HCC) 07/2018   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   Sleep apnea    does not uses cpap      Dispostion: Disposition decision including need for hospitalization was considered, and patient admitted to the hospital.    Final Clinical Impression(s) / ED Diagnoses Final diagnoses:  Metabolic encephalopathy  Hypoglycemia     This chart was dictated using voice recognition software.  Despite best efforts to proofread,  errors can occur which can change the documentation meaning.    Lonell Grandchild, MD 07/25/23 [phone]

## 2023-07-25 NOTE — Assessment & Plan Note (Signed)
Chronic. Baseline Scr 1.0

## 2023-07-25 NOTE — Progress Notes (Signed)
07/25/23 1500  SLP Visit Information  SLP Received On 07/25/23  Subjective  Subjective Patient awake in bed  Patient/Family Stated Goal Patient reports problems swallowing more pronounced in the last 6 months  General Information  Date of Onset 07/25/23  HPI 77 year old with history of chronic anemia, chronic back pain, chronic constipation, CKD stage IIIb, hypertension and hyperlipidemia, hypothyroidism, dementia, history of pulmonary embolism recently underwent right shoulder replacement, significant postoperative delirium and confusion and was ultimately sent to skilled nursing rehab, now admitted from skilled nursing facility with confusion found to have COVID and diabetic hypoglycemia.  Per MD note she also has a UTI.  Patient has undergone prior modified barium swallow studies x 2 and esophagram.  Most recent MBS was conducted 07/09/2023 and she was noted to have mild oropharyngeal dysphagia with impaired pharyngeal stripping and impaired tongue base retraction resulting in retention.  Requiring several swallows to diminish pharyngeal retention.  In addition prior esophagram shows patulous esophagus, esoph dysmotlity, small hiatal hernia and trace reflux.  Prior BSE with possible velopharyngeal dysfunction with hypernasality and complaints of nasal regugitation. MBS 01/14/22 without penetration or aspiration but possible decreased UES opening with barium pooled above UES.  Patient chest x-ray showed left more than right infiltrate.  Patient has undergone prior brain imaging and current CT scan was negative for acute change.  She does endorse worsening of her swallowing in the last 6 months.  Nurse reports she did not pass her Yale swallow screening and that family states patient has a chronic dysphagia.  Type of Study Bedside Swallow Evaluation  Previous Swallow Assessment See HPI  Diet Prior to this Study NPO  Temperature Spikes Noted No  Respiratory Status Nasal cannula  History of Recent  Intubation No  Behavior/Cognition Alert;Cooperative;Pleasant mood  Oral Cavity Assessment Excessive secretions  Oral Care Completed by SLP Yes  Oral Cavity - Dentition Adequate natural dentition  Vision Functional for self-feeding  Self-Feeding Abilities Total assist  Patient Positioning  (Reverse Trendelenburg for optimal positioning due to patient having discomfort if bent at the waist)  Baseline Vocal Quality Low vocal intensity  Volitional Cough Weak (At times productive)  Volitional Swallow Able to elicit  Pain Assessment  Faces Pain Scale 4  Pain Location  (Her shoulder and bottom)  Pain Descriptors / Indicators Discomfort  Pain Intervention(s) Limited activity within patient's tolerance  Oral Assessment (Complete on admission/transfer/every shift)  Oral Assessment  (WDL) X  Lips Symmetrical;Smooth  Teeth Intact  Tongue Pink;Moist  Mucous Membrane(s) Moist;Pink  Saliva Moist, saliva free flowing  Level of Consciousness Alert  Is patient on any of following O2 devices? None of the above  Nutritional status No high risk factors  Oral Assessment Risk  Low Risk  Oral Motor/Sensory Function  Overall Oral Motor/Sensory Function WFL  Ice Chips  Ice chips NT  Thin Liquid  Thin Liquid Impaired  Presentation Self Fed;Spoon;Straw  Oral Phase Impairments Reduced lingual movement/coordination  Oral Phase Functional Implications Prolonged oral transit  Pharyngeal  Phase Impairments Multiple swallows;Cough - Delayed  Nectar Thick Liquid  Nectar Thick Liquid Impaired  Presentation Cup;Straw;Spoon  Oral Phase Impairments Poor awareness of bolus;Reduced lingual movement/coordination  Oral phase functional implications Prolonged oral transit  Pharyngeal Phase Impairments Multiple swallows  Honey Thick Liquid  Honey Thick Liquid NT  Puree  Puree Impaired  Presentation Self Fed;Spoon  Pharyngeal Phase Impairments Multiple swallows  Solid  Solid Impaired  Presentation Self Fed   Oral Phase Impairments Reduced lingual movement/coordination;Impaired mastication  Oral Phase Functional Implications Impaired mastication  Pharyngeal Phase Impairments Multiple swallows  SLP - End of Session  Patient left in bed;with call bell/phone within reach  Nurse Communication Diet recommendation;Swallow strategies reviewed  Suspected Esophageal Findings  Suspected Esophageal Findings Globus sensation  SLP Assessment  Clinical Impression Statement (ACUTE ONLY) Patient presents with clinical indications of oropharyngeal dysphagia.  Most especially concern for pharyngeal component given patient requiring multiple swallows per bolus and coughing expectorating food that she had consumed.  She endorses dysphagia worsening in the last 6 months.  Recent hospital admission showed mild oropharyngeal dysphagia and patient was left on a dysphagia 3 and thin liquid diet.  Repeat MBS indicated to assure patient on least restrictive diet and compensation strategies in place  SLP Visit Diagnosis Dysphagia, oropharyngeal phase (R13.12)  Impact on safety and function Moderate aspiration risk  Other Related Risk Factors History of pneumonia;History of GERD;History of dysphagia;History of esophageal-related issues;Deconditioning;Decreased respiratory status  Swallow Evaluation Recommendations  SLP Diet Recommendations NPO (Ice and teaspoons of water)  Medication Administration Via alternative means  Treatment Plan  Oral Care Recommendations Oral care BID  Caregiver Recommendations Have oral suction available  Treatment Recommendations Defer until completion of intrumental exam;F/U MBS in ___ days (Comment)  Individuals Consulted  Consulted and Agree with Results and Recommendations Patient  Progression Toward Goals  Progression toward goals Progressing toward goals  SLP Time Calculation  SLP Start Time (ACUTE ONLY) 1255  SLP Stop Time (ACUTE ONLY) 1315  SLP Time Calculation (min) (ACUTE ONLY) 20  min  SLP Evaluations  $ SLP Speech Visit 1 Visit  SLP Evaluations  $BSS Swallow 1 Procedure    Rolena Infante, MS [facility] SLP Acute Rehab Services Office [phone]

## 2023-07-25 NOTE — Assessment & Plan Note (Signed)
Observation med/surg bed. Not hypoxic. No fevers. Will hold off on paxlovid for now.

## 2023-07-25 NOTE — Subjective & Objective (Signed)
CC: confusion HPI: 77 year old African-American female history of morbid obesity BMI of 32.8, type 2 diabetes, history of PE, CKD stage IIIb baseline creatinine 1.0, hypertension, recent chronic Foley catheter placement due to urinary retention, chronic edema due to hypoalbuminemia, chronic anemia from chronic kidney disease, chronic back pain, who was discharged to [facility] nursing home on 07/16/2023.  She presents back from the facility today due to reported confusion.  Patient has no diagnosis of dementia.  She was reportedly diagnosed with COVID yesterday at the facility.  On arrival temp 99.5 heart rate 75 blood pressure 147/91 satting 97% on room air.  Initial glucose was 58  UA showed positive leukocyte Estrace greater than 50 bacteria  White count 6.3, hemoglobin 9.1, platelets of 264  Sodium 138, potassium 4.2, chloride 108, bicarb 23, BUN 23, creatinine 0.8, serum glucose of 67  Total protein 4.9, albumin 2.0, AST 128, ALT of 49, alk phos of 39  TSH high at 7.4 Ammonia less than 10  Patient given a half amp of D50.  Repeat blood sugars dropped back down to 62  Second half amp of D50 ordered.   CT head negative for acute intracranial process.  Chest x-ray shows cardiomegaly, atelectasis at the bases.  [facility] hospitalist consulted.

## 2023-07-25 NOTE — ED Notes (Signed)
Back from xray

## 2023-07-25 NOTE — ED Notes (Addendum)
Pt awake, resting, NAD, calm, interactive, oriented x4 with some mild confusion. Denies pain, sob, nausea. VSS. CBG 112. Remains NPO after failed swallow screen. Meds held. Admitting MD aware per report.

## 2023-07-25 NOTE — Assessment & Plan Note (Signed)
Chronic. BMI 32.8

## 2023-07-25 NOTE — ED Notes (Signed)
SLP at BS 

## 2023-07-25 NOTE — Assessment & Plan Note (Addendum)
Stable. Continue synthroid 50 mcg. Check FT4

## 2023-07-25 NOTE — Progress Notes (Addendum)
PROGRESS NOTE    Tricia Harrison  WUJ:[id number] DOB: 1946/06/21 DOA: 07/24/2023 PCP: Darrow Bussing, MD   Brief Narrative:  This 77 years old female with PMH significant for morbid obesity, type 2 diabetes, history of PE, CKD stage IIIb with baseline creatinine 1.0, hypertension, recent chronic Foley catheter placement due to urinary retention, chronic edema due to hypoalbuminemia, chronic anemia from chronic kidney disease, chronic back pain who was discharged to [facility] nursing home on 07/16/2023.  She presented back from the facility due to reported confusion.  Patient has no history of dementia.  Patient was reportedly diagnosed with COVID yesterday at the facility.  UA also positive for leukocyte Estrace many bacteria.,  Ammonia level is less than 10.  CT head negative for acute abnormality.  Patient blood sugar was also found to be low 62.  Patient is admitted for further evaluation.  Assessment & Plan:   Principal Problem:   COVID-19 virus infection Active Problems:   Acute metabolic encephalopathy   Edema due to hypoalbuminemia   Diabetic hypoglycemia (HCC)   Catheter-associated urinary tract infection (HCC)   Essential hypertension   Hypothyroidism   Morbid obesity (HCC)   Type 2 diabetes mellitus (HCC)   History of pulmonary embolism   Obstructive sleep apnea   Chronic kidney disease, stage 3b (HCC)   Chronic constipation   COVID-19 infection: Patient does not appear hypoxic.  Denies any fever. Will hold on Paxlovid for now.  Continue supportive care.  Catheter-associated urinary tract infection (HCC) UA consistent with UTI. Continue IV Rocephin.  Follow-up urine culture   Diabetic hypoglycemia [facility]) May be one of the etiologies of her acute metabolic encephalopathy.  Has required 2 doses of 25 g of IV D50. Will start low dose D10 gtts. Hold DM meds.   Edema due to hypoalbuminemia: Pt with low total protein and albumin. If needing to diurese, would add bumex  or demadex.   Can also give IV albumin along with IV lasix.   Acute metabolic encephalopathy: Probably due to covid and diabetic hypoglycemic, UTI. CT head negative for acute abnormality. Mental status is improving.   Chronic constipation Continue senna and miralax.  Hx of hemorrhoids with rectal bleeding in the past when she gets constipated.   Chronic kidney disease, stage 3b (HCC): Chronic. Baseline Scr 1.0. Avoid nephrotoxic medications.  Continue hydration   Obstructive sleep apnea: Continue CPAP prn   History of pulmonary embolism: On DVT prophylaxis with 40 mg SQ lovenox.  Per last discharge summary, pt intolerant to restarting Eliquis due to recurrent hematuria.   Type 2 diabetes mellitus (HCC): Hold DM meds due to hypoglycemia.    Hypothyroidism: Stable. Continue synthroid 50 mcg. Check FT4   Essential hypertension: Continue coreg 6.25 mg qday.  Obesity: Diet and exercise discussed in detail Estimated body mass index is 32.88 kg/m as calculated from the following:   Height as of this encounter: 5\' 1"  (1.549 m).   Weight as of this encounter: 78.9 kg.    DVT prophylaxis: Lovenox Code Status: Full code Family Communication: No family at bed side Disposition Plan:     Status is: Inpatient Remains inpatient appropriate because: Admitted for acute metabolic encephalopathy likely due to UTI now requiring IV antibiotics.   Consultants:  None  Procedures: None  Antimicrobials:  Anti-infectives (From admission, onward)    Start     Dose/Rate Route Frequency Ordered Stop   07/25/23 2300  cefTRIAXone (ROCEPHIN) 1 g in sodium chloride 0.9 % 100 mL IVPB  1 g 200 mL/hr over 30 Minutes Intravenous Every 24 hours 07/25/23 0430     07/24/23 2145  cefTRIAXone (ROCEPHIN) 1 g in sodium chloride 0.9 % 100 mL IVPB        1 g 200 mL/hr over 30 Minutes Intravenous  Once 07/24/23 2143 07/24/23 2330      Subjective: Patient was seen and examined at bedside.   Overnight events noted. Patient seems improved.  Back to baseline mental status.   Objective: Vitals:   07/25/23 1100 07/25/23 1130 07/25/23 1200 07/25/23 1230  BP: (!) 170/84 (!) 169/89 (!) 172/88 131/78  Pulse:    72  Resp: 20 19 18 18   Temp:      TempSrc:      SpO2:    95%  Weight:      Height:        Intake/Output Summary (Last 24 hours) at 07/25/2023 1518 Last data filed at 07/24/2023 2330 Gross per 24 hour  Intake 1100 ml  Output --  Net 1100 ml   Filed Weights   07/24/23 1811  Weight: 78.9 kg    Examination:  General exam: Appears calm and comfortable, deconditioned, not in any acute distress. Respiratory system: Clear to auscultation. Respiratory effort normal. RR 15 Cardiovascular system: S1 & S2 heard, RRR. No JVD, murmurs, rubs, gallops or clicks. Gastrointestinal system: Abdomen is non distended, soft and non tender. Normal bowel sounds heard. Central nervous system: Alert and oriented X 3. No focal neurological deficits. Extremities: Edema+, no cyanosis, no clubbing Skin: No rashes, lesions or ulcers Psychiatry: Judgement and insight appear normal. Mood & affect appropriate.     Data Reviewed: I have personally reviewed following labs and imaging studies  CBC: Recent Labs  Lab 07/24/23 2047  WBC 6.3  HGB 9.1*  HCT 28.4*  MCV 96.6  PLT 264   Basic Metabolic Panel: Recent Labs  Lab 07/24/23 2047  NA 138  K 4.2  CL 108  CO2 23  GLUCOSE 67*  BUN 23  CREATININE 0.82  CALCIUM 8.2*   GFR: Estimated Creatinine Clearance: 54.6 mL/min (by C-G formula based on SCr of 0.82 mg/dL). Liver Function Tests: Recent Labs  Lab 07/24/23 2047  AST 128*  ALT 49*  ALKPHOS 39  BILITOT 0.6  PROT 4.9*  ALBUMIN 2.0*   No results for input(s): "LIPASE", "AMYLASE" in the last 168 hours. Recent Labs  Lab 07/24/23 2150  AMMONIA <10   Coagulation Profile: No results for input(s): "INR", "PROTIME" in the last 168 hours. Cardiac Enzymes: No results for  input(s): "CKTOTAL", "CKMB", "CKMBINDEX", "TROPONINI" in the last 168 hours. BNP (last 3 results) No results for input(s): "PROBNP" in the last 8760 hours. HbA1C: No results for input(s): "HGBA1C" in the last 72 hours. CBG: Recent Labs  Lab 07/24/23 2341 07/25/23 0248 07/25/23 0424 07/25/23 0652 07/25/23 0856  GLUCAP 62* 67* 79 99 112*   Lipid Profile: No results for input(s): "CHOL", "HDL", "LDLCALC", "TRIG", "CHOLHDL", "LDLDIRECT" in the last 72 hours. Thyroid Function Tests: Recent Labs    07/24/23 2047  TSH 7.432*  FREET4 1.17*   Anemia Panel: No results for input(s): "VITAMINB12", "FOLATE", "FERRITIN", "TIBC", "IRON", "RETICCTPCT" in the last 72 hours. Sepsis Labs: No results for input(s): "PROCALCITON", "LATICACIDVEN" in the last 168 hours.  Recent Results (from the past 240 hour(s))  SARS Coronavirus 2 by RT PCR (hospital order, performed in [facility] hospital lab) *cepheid single result test* Anterior Nasal Swab     Status: Abnormal  Collection Time: 07/24/23  9:50 PM   Specimen: Anterior Nasal Swab  Result Value Ref Range Status   SARS Coronavirus 2 by RT PCR POSITIVE (A) NEGATIVE Final    Comment: (NOTE) SARS-CoV-2 target nucleic acids are DETECTED  SARS-CoV-2 RNA is generally detectable in upper respiratory specimens  during the acute phase of infection.  Positive results are indicative  of the presence of the identified virus, but do not rule out bacterial infection or co-infection with other pathogens not detected by the test.  Clinical correlation with patient history and  other diagnostic information is necessary to determine patient infection status.  The expected result is negative.  Fact Sheet for Patients:   RoadLapTop.co.za   Fact Sheet for Healthcare Providers:   http://kim-miller.com/    This test is not yet approved or cleared by the [place] FDA and  has been authorized for detection  and/or diagnosis of SARS-CoV-2 by FDA under an Emergency Use Authorization (EUA).  This EUA will remain in effect (meaning this test can be used) for the duration of  the COVID-19 declaration under Section 564(b)(1)  of the Act, 21 U.S.C. section 360-bbb-3(b)(1), unless the authorization is terminated or revoked sooner.   Performed at [facility], [address], [place], [place] [address]    Radiology Studies: CT Head Wo Contrast  Result Date: 07/24/2023 CLINICAL DATA:  Altered level of consciousness, forgetfulness, recent COVID diagnosis EXAM: CT HEAD WITHOUT CONTRAST TECHNIQUE: Contiguous axial images were obtained from the base of the skull through the vertex without intravenous contrast. RADIATION DOSE REDUCTION: This exam was performed according to the departmental dose-optimization program which includes automated exposure control, adjustment of the mA and/or kV according to patient size and/or use of iterative reconstruction technique. COMPARISON:  01/11/2022 FINDINGS: Brain: Chronic small vessel ischemic changes are seen within the periventricular white matter and bilateral basal ganglia. No acute infarct or hemorrhage. Lateral ventricles and remaining midline structures are unremarkable. No acute extra-axial fluid collections. No mass effect. Vascular: Stable atherosclerosis.  No hyperdense vessel. Skull: Normal. Negative for fracture or focal lesion. Sinuses/Orbits: No acute finding. Other: None. IMPRESSION: 1. No acute intracranial process. Electronically Signed   By: Sharlet Salina M.D.   On: 07/24/2023 21:24   DG Chest Port 1 View  Result Date: 07/24/2023 CLINICAL DATA:  Cough. Forgetfulness. Diagnosed with COVID yesterday. EXAM: PORTABLE CHEST 1 VIEW COMPARISON:  07/04/2023 FINDINGS: Shallow inspiration. Cardiac enlargement. Suggestion of infiltration or atelectasis in both lung bases, greater on the left. Possible small left pleural effusion. No pneumothorax.  Degenerative changes in the spine and left shoulder. Postoperative change in the right shoulder. IMPRESSION: Cardiac enlargement. Shallow inspiration with infiltration or atelectasis in the lung bases, greater on the left. Possible small left pleural effusion. Electronically Signed   By: Burman Nieves M.D.   On: 07/24/2023 20:11    Scheduled Meds:  carvedilol  6.25 mg Oral q AM   enoxaparin (LOVENOX) injection  40 mg Subcutaneous Q24H   levothyroxine  50 mcg Oral QAC breakfast   pantoprazole  40 mg Oral Daily   polyethylene glycol  17 g Oral Daily   senna-docusate  2 tablet Oral Daily   Continuous Infusions:  cefTRIAXone (ROCEPHIN)  IV     dextrose 10 % 1,000 mL with potassium chloride 20 mEq infusion 50 mL/hr at 07/25/23 0902     LOS: 0 days    Time spent: 50 mins    Willeen Niece, MD [facility]   If  7PM-7AM, please contact night-coverage

## 2023-07-25 NOTE — Assessment & Plan Note (Signed)
Continue senna and miralax. Hx of hemorrhoids with rectal bleeding in the past when she gets constipated.

## 2023-07-25 NOTE — Assessment & Plan Note (Signed)
Hold DM meds due to hypoglycemia.

## 2023-07-25 NOTE — ED Notes (Signed)
Patient turned/repositioned at this time

## 2023-07-25 NOTE — Assessment & Plan Note (Addendum)
May be one of the etiologies of her acute metabolic encephalopathy. Has required 2 doses of 25 g of IV D50. Will start low dose D10 gtts. Hold DM meds.

## 2023-07-25 NOTE — Assessment & Plan Note (Signed)
Prn cpap

## 2023-07-25 NOTE — Assessment & Plan Note (Signed)
Continue coreg 6.25 mg qday.

## 2023-07-25 NOTE — Assessment & Plan Note (Signed)
On DVT prophylaxis with 40 mg SQ lovenox. Per last discharge summary, pt intolerant to restarting Eliquis due to recurrent hematuria.

## 2023-07-25 NOTE — Assessment & Plan Note (Signed)
Start IV rocephin.

## 2023-07-25 NOTE — Assessment & Plan Note (Signed)
Pt with low total protein and albumin. If needing to diurese, would add bumex or demadex.  Can also give IV albumin along with IV lasix.

## 2023-07-25 NOTE — Assessment & Plan Note (Signed)
Probably due to covid and diabetic hypoglycemic, uti.

## 2023-07-26 DIAGNOSIS — U071 COVID-19: Secondary | ICD-10-CM | POA: Diagnosis not present

## 2023-07-26 LAB — PHOSPHORUS: Phosphorus: 2.3 mg/dL — ABNORMAL LOW (ref 2.5–4.6)

## 2023-07-26 LAB — CBC
HCT: 28.9 % — ABNORMAL LOW (ref 36.0–46.0)
Hemoglobin: 9 g/dL — ABNORMAL LOW (ref 12.0–15.0)
MCH: 30.4 pg (ref 26.0–34.0)
MCHC: 31.1 g/dL (ref 30.0–36.0)
MCV: 97.6 fL (ref 80.0–100.0)
Platelets: 242 10*3/uL (ref 150–400)
RBC: 2.96 MIL/uL — ABNORMAL LOW (ref 3.87–5.11)
RDW: 16.6 % — ABNORMAL HIGH (ref 11.5–15.5)
WBC: 3.2 10*3/uL — ABNORMAL LOW (ref 4.0–10.5)
nRBC: 0 % (ref 0.0–0.2)

## 2023-07-26 LAB — COMPREHENSIVE METABOLIC PANEL
ALT: 32 U/L (ref 0–44)
AST: 69 U/L — ABNORMAL HIGH (ref 15–41)
Albumin: 1.8 g/dL — ABNORMAL LOW (ref 3.5–5.0)
Alkaline Phosphatase: 34 U/L — ABNORMAL LOW (ref 38–126)
Anion gap: 7 (ref 5–15)
BUN: 18 mg/dL (ref 8–23)
CO2: 21 mmol/L — ABNORMAL LOW (ref 22–32)
Calcium: 7.6 mg/dL — ABNORMAL LOW (ref 8.9–10.3)
Chloride: 106 mmol/L (ref 98–111)
Creatinine, Ser: 0.87 mg/dL (ref 0.44–1.00)
GFR, Estimated: >60 mL/min (ref >60)
Glucose, Bld: 130 mg/dL — ABNORMAL HIGH (ref 70–99)
Potassium: 3.7 mmol/L (ref 3.5–5.1)
Sodium: 134 mmol/L — ABNORMAL LOW (ref 135–145)
Total Bilirubin: 0.5 mg/dL (ref 0.3–1.2)
Total Protein: 4.4 g/dL — ABNORMAL LOW (ref 6.5–8.1)

## 2023-07-26 LAB — GLUCOSE, CAPILLARY
Glucose-Capillary: 112 mg/dL — ABNORMAL HIGH (ref 70–99)
Glucose-Capillary: 113 mg/dL — ABNORMAL HIGH (ref 70–99)
Glucose-Capillary: 135 mg/dL — ABNORMAL HIGH (ref 70–99)

## 2023-07-26 LAB — MAGNESIUM: Magnesium: 1.5 mg/dL — ABNORMAL LOW (ref 1.7–2.4)

## 2023-07-26 MED ORDER — SODIUM PHOSPHATES 45 MMOLE/15ML IV SOLN
15.0000 mmol | Freq: Once | INTRAVENOUS | Status: AC
  Administered 2023-07-26: 15 mmol via INTRAVENOUS
  Filled 2023-07-26: qty 5

## 2023-07-26 MED ORDER — MAGNESIUM SULFATE 2 GM/50ML IV SOLN
2.0000 g | Freq: Once | INTRAVENOUS | Status: AC
  Administered 2023-07-26: 2 g via INTRAVENOUS
  Filled 2023-07-26: qty 50

## 2023-07-26 MED ORDER — SODIUM PHOSPHATES 45 MMOLE/15ML IV SOLN: 30.0000 mmol | Freq: Once | INTRAVENOUS | Status: DC

## 2023-07-26 MED ORDER — CARVEDILOL 3.125 MG PO TABS
3.1250 mg | ORAL_TABLET | Freq: Every day | ORAL | Status: DC
  Administered 2023-07-27 – 2023-07-28 (×2): 3.125 mg via ORAL
  Filled 2023-07-26 (×3): qty 1

## 2023-07-26 NOTE — Progress Notes (Signed)
PROGRESS NOTE    Tricia Harrison  ZOX:[id number] DOB: 10/07/1946 DOA: 07/24/2023 PCP: Darrow Bussing, MD   Brief Narrative:  This 77 years old female with PMH significant for morbid obesity, type 2 diabetes, history of PE, CKD stage IIIb with baseline creatinine 1.0, hypertension, recent chronic Foley catheter placement due to urinary retention, chronic edema due to hypoalbuminemia, chronic anemia from chronic kidney disease, chronic back pain who was discharged to [facility] nursing home on 07/16/2023.  She presented back from the facility due to reported confusion.  Patient has no history of dementia.  Patient was reportedly diagnosed with COVID yesterday at the facility.  UA also positive for leukocyte Estrace many bacteria.,  Ammonia level is less than 10.  CT head negative for acute abnormality.  Patient blood sugar was also found to be low 62.  Patient is admitted for further evaluation.  Assessment & Plan:   Principal Problem:   COVID-19 virus infection Active Problems:   Acute metabolic encephalopathy   Edema due to hypoalbuminemia   Diabetic hypoglycemia (HCC)   Catheter-associated urinary tract infection (HCC)   Essential hypertension   Hypothyroidism   Morbid obesity (HCC)   Type 2 diabetes mellitus (HCC)   History of pulmonary embolism   Obstructive sleep apnea   Chronic kidney disease, stage 3b (HCC)   Chronic constipation  COVID-19 infection: Patient does not appear hypoxic.  Denies any fever. Will hold on Paxlovid for now.  Continue supportive care.  Catheter-associated urinary tract infection (HCC) UA consistent with UTI. Continue IV Rocephin.  Follow-up urine culture   Diabetic hypoglycemia [facility]) May be one of the etiologies of her acute metabolic encephalopathy.  Has required 2 doses of 25 g of IV D50. Continue  low dose D10 gtts. Hold DM meds.   Edema due to hypoalbuminemia: Pt with low total protein and albumin. If needing to diurese, would add bumex or  demadex.   Can also give IV albumin along with IV lasix.   Acute metabolic encephalopathy: Probably due to covid and diabetic hypoglycemic, UTI. CT head negative for acute abnormality. Mental status is improving.   Chronic constipation Continue senna and miralax.  Hx of hemorrhoids with rectal bleeding in the past when she gets constipated.   Chronic kidney disease, stage 3b (HCC): Chronic. Baseline Scr 1.0. Avoid nephrotoxic medications.  Continue hydration   Obstructive sleep apnea: Continue CPAP prn   History of pulmonary embolism: On DVT prophylaxis with 40 mg SQ lovenox.  Per last discharge summary, pt intolerant to restarting Eliquis due to recurrent hematuria.   Type 2 diabetes mellitus (HCC): Hold DM meds due to hypoglycemia.  Hypothyroidism: Stable. Continue synthroid 50 mcg. Check FT4   Essential hypertension: PTA coreg 6.25 mg qday. Dose reduced to 3.125 mg due to bradycardia  Sinus bradycardia: Decrease Coreg to 3.125 mg every 12 hours.   Obesity: Diet and exercise discussed in detail Estimated body mass index is 36.95 kg/m as calculated from the following:   Height as of this encounter: 5\' 1"  (1.549 m).   Weight as of this encounter: 88.7 kg.    DVT prophylaxis: Lovenox Code Status: Full code Family Communication: No family at bed side Disposition Plan:     Status is: Inpatient Remains inpatient appropriate because: Admitted for acute metabolic encephalopathy likely due to UTI , now requiring IV antibiotics. Patient also COVID-positive requiring supportive care.   Consultants:  None  Procedures: None  Antimicrobials:  Anti-infectives (From admission, onward)    Start  Dose/Rate Route Frequency Ordered Stop   07/25/23 2300  cefTRIAXone (ROCEPHIN) 1 g in sodium chloride 0.9 % 100 mL IVPB        1 g 200 mL/hr over 30 Minutes Intravenous Every 24 hours 07/25/23 0430     07/24/23 2145  cefTRIAXone (ROCEPHIN) 1 g in sodium chloride 0.9 %  100 mL IVPB        1 g 200 mL/hr over 30 Minutes Intravenous  Once 07/24/23 2143 07/24/23 2330      Subjective: Patient was seen and examined at bedside.  Overnight events noted. Patient seems improved.  Appears back to her baseline mental status. Family at bedside states she does have intermittent episodes of disorientation.   Objective: Vitals:   07/26/23 0123 07/26/23 0500 07/26/23 0557 07/26/23 1131  BP: 126/70  115/61 112/65  Pulse: (!) 44  (!) 42 (!) 42  Resp: 18  18 17   Temp: 98.1 F (36.7 C)  99.1 F (37.3 C) 99 F (37.2 C)  TempSrc: Oral  Oral Oral  SpO2: 100%  99% 100%  Weight:  88.7 kg    Height:        Intake/Output Summary (Last 24 hours) at 07/26/2023 1324 Last data filed at 07/26/2023 1314 Gross per 24 hour  Intake 2096.8 ml  Output 450 ml  Net 1646.8 ml   Filed Weights   07/24/23 1811 07/26/23 0500  Weight: 78.9 kg 88.7 kg    Examination:  General exam: Appears calm and comfortable, deconditioned, not in any acute distress. Respiratory system: Clear to auscultation. Respiratory effort normal. RR 14 Cardiovascular system: S1 & S2 heard, RRR. No JVD, murmurs, rubs, gallops or clicks. Gastrointestinal system: Abdomen is non distended, soft and non tender. Normal bowel sounds heard. Central nervous system: Alert and oriented X 3. No focal neurological deficits. Extremities: Edema+, no cyanosis, no clubbing Skin: No rashes, lesions or ulcers Psychiatry: Judgement and insight appear normal. Mood & affect appropriate.     Data Reviewed: I have personally reviewed following labs and imaging studies  CBC: Recent Labs  Lab 07/24/23 2047 07/26/23 1200  WBC 6.3 3.2*  HGB 9.1* 9.0*  HCT 28.4* 28.9*  MCV 96.6 97.6  PLT 264 242   Basic Metabolic Panel: Recent Labs  Lab 07/24/23 2047 07/26/23 1200  NA 138 134*  K 4.2 3.7  CL 108 106  CO2 23 21*  GLUCOSE 67* 130*  BUN 23 18  CREATININE 0.82 0.87  CALCIUM 8.2* 7.6*  MG  --  1.5*  PHOS  --   2.3*   GFR: Estimated Creatinine Clearance: 54.9 mL/min (by C-G formula based on SCr of 0.87 mg/dL). Liver Function Tests: Recent Labs  Lab 07/24/23 2047 07/26/23 1200  AST 128* 69*  ALT 49* 32  ALKPHOS 39 34*  BILITOT 0.6 0.5  PROT 4.9* 4.4*  ALBUMIN 2.0* 1.8*   No results for input(s): "LIPASE", "AMYLASE" in the last 168 hours. Recent Labs  Lab 07/24/23 2150  AMMONIA <10   Coagulation Profile: No results for input(s): "INR", "PROTIME" in the last 168 hours. Cardiac Enzymes: No results for input(s): "CKTOTAL", "CKMB", "CKMBINDEX", "TROPONINI" in the last 168 hours. BNP (last 3 results) No results for input(s): "PROBNP" in the last 8760 hours. HbA1C: No results for input(s): "HGBA1C" in the last 72 hours. CBG: Recent Labs  Lab 07/25/23 0248 07/25/23 0424 07/25/23 0652 07/25/23 0856 07/26/23 1130  GLUCAP 67* 79 99 112* 135*   Lipid Profile: No results for input(s): "CHOL", "HDL", "LDLCALC", "  TRIG", "CHOLHDL", "LDLDIRECT" in the last 72 hours. Thyroid Function Tests: Recent Labs    07/24/23 2047  TSH 7.432*  FREET4 1.17*   Anemia Panel: No results for input(s): "VITAMINB12", "FOLATE", "FERRITIN", "TIBC", "IRON", "RETICCTPCT" in the last 72 hours. Sepsis Labs: No results for input(s): "PROCALCITON", "LATICACIDVEN" in the last 168 hours.  Recent Results (from the past 240 hour(s))  Urine Culture     Status: Abnormal (Preliminary result)   Collection Time: 07/24/23  8:29 PM   Specimen: Urine, Clean Catch  Result Value Ref Range Status   Specimen Description   Final    URINE, CLEAN CATCH Performed at [facility], [address], [place], [place] [address]    Special Requests   Final    NONE Performed at [facility], [address] [place], [place] [address]    Culture (A)  Final    >=100,000 COLONIES/mL GRAM NEGATIVE RODS SUSCEPTIBILITIES TO FOLLOW Performed at [facility], [address]  [place], [place] [address]    Report Status PENDING  Incomplete  SARS Coronavirus 2 by RT PCR (hospital order, performed in [facility] hospital lab) *cepheid single result test* Anterior Nasal Swab     Status: Abnormal   Collection Time: 07/24/23  9:50 PM   Specimen: Anterior Nasal Swab  Result Value Ref Range Status   SARS Coronavirus 2 by RT PCR POSITIVE (A) NEGATIVE Final    Comment: (NOTE) SARS-CoV-2 target nucleic acids are DETECTED  SARS-CoV-2 RNA is generally detectable in upper respiratory specimens  during the acute phase of infection.  Positive results are indicative  of the presence of the identified virus, but do not rule out bacterial infection or co-infection with other pathogens not detected by the test.  Clinical correlation with patient history and  other diagnostic information is necessary to determine patient infection status.  The expected result is negative.  Fact Sheet for Patients:   RoadLapTop.co.za   Fact Sheet for Healthcare Providers:   http://kim-miller.com/    This test is not yet approved or cleared by the [place] FDA and  has been authorized for detection and/or diagnosis of SARS-CoV-2 by FDA under an Emergency Use Authorization (EUA).  This EUA will remain in effect (meaning this test can be used) for the duration of  the COVID-19 declaration under Section 564(b)(1)  of the Act, 21 U.S.C. section 360-bbb-3(b)(1), unless the authorization is terminated or revoked sooner.   Performed at [facility], [address], [place], [place] [address]    Radiology Studies: DG Swallowing Func-Speech Pathology  Result Date: 07/25/2023 Table formatting from the original result was not included. Modified Barium Swallow Study Patient Details Name: Ramonica Bourdier MRN: [id number] Date of Birth: Feb 22, 1946 Today's Date: 07/25/2023 HPI/PMH: HPI: 77 year old with history of chronic anemia, chronic  back pain, chronic constipation, CKD stage IIIb, hypertension and hyperlipidemia, hypothyroidism, dementia, history of pulmonary embolism recently underwent right shoulder replacement, significant postoperative delirium and confusion and was ultimately sent to skilled nursing rehab, now admitted from skilled nursing facility with confusion found to have COVID and diabetic hypoglycemia.  Per MD note she also has a UTI.  Patient has undergone prior modified barium swallow studies x 2 and esophagram.  Most recent MBS was conducted 07/09/2023 and she was noted to have mild oropharyngeal dysphagia with impaired pharyngeal stripping and impaired tongue base retraction resulting in retention.  Requiring several swallows to diminish pharyngeal retention.  In addition prior esophagram shows  patulous esophagus, esoph dysmotlity, small hiatal hernia and trace reflux.  Prior BSE with possible velopharyngeal dysfunction with hypernasality and complaints of nasal regugitation. MBS 01/14/22 without penetration or aspiration but possible decreased UES opening with barium pooled above UES.  Patient chest x-ray showed left more than right infiltrate.  Patient has undergone prior brain imaging and current CT scan was negative for acute change.  She does endorse worsening of her swallowing in the last 6 months.  Nurse reports she did not pass her Yale swallow screening and that family states patient has a chronic dysphagia. Clinical Impression: Clinical Impression: Pt demonstrates mild oropharyngeal dysphagia consistent with prior MBS.  Her motility deficits do not result in laryngeal penetration or aspiration but allow significant retention.  Oral transiting continues to be impaired - more with increased viscocity.  Velopharyngeal function, pharyngeal stripping wave and tongue base retraction deficits allow pharyngeal retention that pt requires a multitude of swallows to clear (up to 4).  A-P view challenging as pt was leaning to the  right significantly.  Various postures including head turn left and chin tuck did not improve oropharyngeal clearance.    Chopped meats, Dys 3. Continue thin liquids, multiple swallows and alternate liquids and solids.  Did not test a barium tablet - but recommend meds with applesauce - whole if small, consider crushed if large.  SLP questions source of pt's pharyngeal dysphagia - however prior brain imaging showed Mild chronic small vessel ischemic disease. Small lacunar infarcts in the left basal ganglia- ? if this could be the source? Factors that may increase risk of adverse event in presence of aspiration Rubye Oaks & Clearance Coots 2021): Factors that may increase risk of adverse event in presence of aspiration Rubye Oaks & Clearance Coots 2021): Reduced saliva; Weak cough Recommendations/Plan: Swallowing Evaluation Recommendations Swallowing Evaluation Recommendations Recommendations: PO diet PO Diet Recommendation: Dysphagia 3 (Mechanical soft); Thin liquids (Level 0) Liquid Administration via: Cup; Straw Medication Administration: Crushed with puree Supervision: Patient able to self-feed; Intermittent supervision/cueing for swallowing strategies Swallowing strategies  : Multiple dry swallows after each bite/sip; Small bites/sips; Slow rate Postural changes: Position pt fully upright for meals Oral care recommendations: Oral care BID (2x/day) Caregiver Recommendations: Have oral suction available Treatment Plan Treatment Plan Treatment recommendations: Therapy as outlined in treatment plan below Follow-up recommendations: Skilled nursing-short term rehab (<3 hours/day) Functional status assessment: Patient has had a recent decline in their functional status and demonstrates the ability to make significant improvements in function in a reasonable and predictable amount of time. Treatment frequency: Min 1x/week Treatment duration: 2 weeks Interventions: Aspiration precaution training; Patient/family education; Diet toleration  management by SLP Recommendations Recommendations for follow up therapy are one component of a multi-disciplinary discharge planning process, led by the attending physician.  Recommendations may be updated based on patient status, additional functional criteria and insurance authorization. Assessment: Orofacial Exam: Orofacial Exam Oral Cavity: Oral Hygiene: Pooled secretions Oral Cavity - Dentition: Adequate natural dentition Oral Motor/Sensory Function: Suspected cranial nerve impairment CN V - Trigeminal: WFL CN VII - Facial: WFL CN IX - Glossopharyngeal, CN X - Vagus: Left motor impairment (Palatal deviation to the right upon phonation) CN XII - Hypoglossal: -- (Generalized weakness) Anatomy: Anatomy: WFL Boluses Administered: Boluses Administered Boluses Administered: Thin liquids (Level 0); Mildly thick liquids (Level 2, nectar thick); Moderately thick liquids (Level 3, honey thick); Puree; Solid  Oral Impairment Domain: Oral Impairment Domain Lip Closure: No labial escape Tongue control during bolus hold: Cohesive bolus between tongue to palatal seal Bolus  preparation/mastication: Slow prolonged chewing/mashing with complete recollection Bolus transport/lingual motion: Delayed initiation of tongue motion (oral holding) Oral residue: Residue collection on oral structures Location of oral residue : Tongue Initiation of pharyngeal swallow : Valleculae  Pharyngeal Impairment Domain: Pharyngeal Impairment Domain Soft palate elevation: Trace column of contrast or air between SP and PW Laryngeal elevation: Complete superior movement of thyroid cartilage with complete approximation of arytenoids to epiglottic petiole Anterior hyoid excursion: Complete anterior movement Epiglottic movement: Complete inversion Laryngeal vestibule closure: Complete, no air/contrast in laryngeal vestibule Pharyngeal stripping wave : Present - diminished Pharyngeal contraction (A/P view only): -- (difficult view due to pt leaning to the  right, thus retention in the right more than left pharynx post-swallow - and pt was tilted posterior impacting adequacy of view) Pharyngoesophageal segment opening: Partial distention/partial duration, partial obstruction of flow Tongue base retraction: Trace column of contrast or air between tongue base and PPW Pharyngeal residue: Collection of residue within or on pharyngeal structures Location of pharyngeal residue: Valleculae; Pharyngeal wall; Pyriform sinuses  Esophageal Impairment Domain: Esophageal Impairment Domain Esophageal clearance upright position: Complete clearance, esophageal coating Pill: No data recorded Penetration/Aspiration Scale Score: Penetration/Aspiration Scale Score 1.  Material does not enter airway: Thin liquids (Level 0); Mildly thick liquids (Level 2, nectar thick); Moderately thick liquids (Level 3, honey thick); Solid; Puree Compensatory Strategies: No data recorded  General Information: Caregiver present: No  Diet Prior to this Study: NPO   Temperature : Normal   Respiratory Status: WFL   Supplemental O2: Nasal cannula   History of Recent Intubation: No  Behavior/Cognition: Alert; Cooperative Self-Feeding Abilities: Dependent for feeding Baseline vocal quality/speech: Abnormal resonance Volitional Cough: Able to elicit Volitional Swallow: Able to elicit Exam Limitations: No limitations Goal Planning: Prognosis for improved oropharyngeal function: Good No data recorded No data recorded Patient/Family Stated Goal: Patient reports problems swallowing more pronounced in the last 6 months Consulted and agree with results and recommendations: Patient Pain: Pain Assessment Faces Pain Scale: 4 Negative Vocalization: 0 Facial Expression: 0 Body Language: 0 Consolability: 0 Pain Location: -- (Her shoulder and bottom) Pain Descriptors / Indicators: Discomfort Pain Intervention(s): Limited activity within patient's tolerance End of Session: Start Time:SLP Start Time (ACUTE ONLY): 1450 Stop Time:  SLP Stop Time (ACUTE ONLY): 1525 Time Calculation:SLP Time Calculation (min) (ACUTE ONLY): 35 min Charges: SLP Evaluations $ SLP Speech Visit: 1 Visit SLP Evaluations $BSS Swallow: 1 Procedure $MBS Swallow: 1 Procedure $Swallowing Treatment: 1 Procedure SLP visit diagnosis: SLP Visit Diagnosis: Dysphagia, oropharyngeal phase (R13.12) Past Medical History: Past Medical History: Diagnosis Date  Anemia   as a child  Anginal pain (HCC)   Asthma   as a child  Bladder incontinence   Chronic back pain   spinal stenosis and buldging disc;scoliosis  Chronic constipation   occasionally takes something OTC  Chronic kidney disease   Diabetes mellitus without complication (HCC)   per pt "Pre" for 20+yrs  Diverticulosis   DVT (deep venous thrombosis) (HCC)   Dyspnea   occasional  Gallstones   has known about this for 3-42yrs  GERD (gastroesophageal reflux disease)   takes Omeprazole daily  Glaucoma   H/O hiatal hernia   Heart murmur   Hemorrhoids   History of blood transfusion   no abnormal reaction noted  History of bronchitis   states its been a long time ago  History of gout   HLD (hyperlipidemia)   takes Fenofibrate daily  HTN (hypertension)   takes Diltiazem and Ramipril daily  Hypothyroidism  takes Synthroid daily  Left knee DJD   Macular degeneration   Nocturia   Palpitations   started in 2013 and only occasionally;last time noticed about 2-3wks ago and after drinking caffeine  Peripheral vascular disease (HCC)   PONV (postoperative nausea and vomiting)   problems swallowing after anethesia- 2/2023and slurred speech also  Pulmonary embolism with acute cor pulmonale (HCC) 07/2018  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  Sleep apnea   does not uses cpap Past Surgical History: Past Surgical History: Procedure Laterality Date  3-day EVENT MONITOR  01/2019  Mostly normal monitor.  Predominantly sinus rhythm (rate range 48-105 bpm, average 67 bpm) 9 short atrial  runs (fastest = 13 beats rate 135 bpm, longest ~17 seconds-101 bpm) -> not noted on symptom log.  No sustained arrhythmias (tachycardia or bradycardia), or pauses..  Symptoms are noted with PVCs.  bilateral cataract surgery     CHOLECYSTECTOMY N/A 01/10/2022  Procedure: LAPAROSCOPIC CHOLECYSTECTOMY WITH INTRAOPERATIVE CHOLANGIOGRAM;  Surgeon: Karie Soda, MD;  Location: [facility] ORS;  Service: General;  Laterality: N/A;  COLONOSCOPY    CT ANGIOGRAM OF CHEST - PE PROTOCOL  07/2018  Bilateral nonocclusive pulmonary emboli evidence of right heart strain consistent with "submassive "/intermediate PE.  -->  Code PE called.  DILATION AND CURETTAGE OF UTERUS    multiple about 6-7 times  ESOPHAGOGASTRODUODENOSCOPY    FLEXIBLE SIGMOIDOSCOPY N/A 07/09/2023  Procedure: FLEXIBLE SIGMOIDOSCOPY;  Surgeon: Charlott Rakes, MD;  Location: [facility] ENDOSCOPY;  Service: Gastroenterology;  Laterality: N/A;  NM VQ LUNG SCAN ([facility] HX)  11/23/2018  Low probability for PE  REVERSE SHOULDER ARTHROPLASTY Right 06/17/2023  Procedure: RIGHT REVERSE SHOULDER ARTHROPLASTY;  Surgeon: Teryl Lucy, MD;  Location: [facility];  Service: Orthopedics;  Laterality: Right;  RIGHT/LEFT HEART CATH AND CORONARY ANGIOGRAPHY N/A 08/19/2018  Procedure: RIGHT/LEFT HEART CATH AND CORONARY ANGIOGRAPHY;  Surgeon: Corky Crafts, MD;  Location: [facility] INVASIVE CV LAB;  Service: Cardiovascular:: Nonobstructive CAD.  Mild pulmonary pretension.  Mean PA pressure 26 mmHg with PA P 48/20 mmHg.  Normal LVEDP.  THYROID SURGERY Right 2010  TOTAL HIP ARTHROPLASTY Left 2005  TOTAL KNEE ARTHROPLASTY Left 02/21/2014  Procedure: TOTAL KNEE ARTHROPLASTY;  Surgeon: Nilda Simmer, MD;  Location: [facility] OR;  Service: Orthopedics;  Laterality: Left;  TRANSTHORACIC ECHOCARDIOGRAM  07/2018   (In setting of acute PE) mild LVH.  EF 55 to 60%.  GR 1 DD.  Aortic sclerosis but no stenosis.  Mild regurgitation.  Mild RA dilation.  Moderate LA dilation.  Moderate tricuspid regurgitation with severe  primary hypertension estimated PA pressure 71 mmHg.  RV poorly visualized  TRANSTHORACIC ECHOCARDIOGRAM  10/2018 - 11/2019  a) EF 60-65%.  GR 1 DD.  Focal basal LVH. Mod AI/ no AS. Mild LA dilation.  Normal RV size and fxn.  Mod PI w/ mild TR.  Peak PAP ~ 38 mmHg; b) Normal EF 60 to 65%.  Moderate LVH-GR 1 DD.  R WMA.  Mild AS w/ trivial AI.  Grossly normal PA, RV and RA size.  Normal RV function.  Normal PA pressures.  TRANSTHORACIC ECHOCARDIOGRAM  01/2021   EF 55%.  Normal LV function.  Mild LVH.  GR 1 DD.  Normal PA pressures.  Severe LA dilation.  (Consistent with more significant diastolic dysfunction).  Mild-possibly mild to moderate aortic insufficiency.  Borderline elevated RAP.  Trivial pericardial effusion.  (Stable)  VAGINAL HYSTERECTOMY  1979 Tammy K, MS  [facility] SLP Acute Rehab Services Office [phone] Chales Abrahams 07/25/2023, 5:15 PM  CT Head Wo Contrast  Result Date: 07/24/2023 CLINICAL DATA:  Altered level of consciousness, forgetfulness, recent COVID diagnosis EXAM: CT HEAD WITHOUT CONTRAST TECHNIQUE: Contiguous axial images were obtained from the base of the skull through the vertex without intravenous contrast. RADIATION DOSE REDUCTION: This exam was performed according to the departmental dose-optimization program which includes automated exposure control, adjustment of the mA and/or kV according to patient size and/or use of iterative reconstruction technique. COMPARISON:  01/11/2022 FINDINGS: Brain: Chronic small vessel ischemic changes are seen within the periventricular white matter and bilateral basal ganglia. No acute infarct or hemorrhage. Lateral ventricles and remaining midline structures are unremarkable. No acute extra-axial fluid collections. No mass effect. Vascular: Stable atherosclerosis.  No hyperdense vessel. Skull: Normal. Negative for fracture or focal lesion. Sinuses/Orbits: No acute finding. Other: None. IMPRESSION: 1. No acute intracranial process. Electronically  Signed   By: Sharlet Salina M.D.   On: 07/24/2023 21:24   DG Chest Port 1 View  Result Date: 07/24/2023 CLINICAL DATA:  Cough. Forgetfulness. Diagnosed with COVID yesterday. EXAM: PORTABLE CHEST 1 VIEW COMPARISON:  07/04/2023 FINDINGS: Shallow inspiration. Cardiac enlargement. Suggestion of infiltration or atelectasis in both lung bases, greater on the left. Possible small left pleural effusion. No pneumothorax. Degenerative changes in the spine and left shoulder. Postoperative change in the right shoulder. IMPRESSION: Cardiac enlargement. Shallow inspiration with infiltration or atelectasis in the lung bases, greater on the left. Possible small left pleural effusion. Electronically Signed   By: Burman Nieves M.D.   On: 07/24/2023 20:11    Scheduled Meds:  [START ON 07/27/2023] carvedilol  3.125 mg Oral q AM   Chlorhexidine Gluconate Cloth  6 each Topical Daily   enoxaparin (LOVENOX) injection  40 mg Subcutaneous Q24H   levothyroxine  50 mcg Oral QAC breakfast   pantoprazole  40 mg Oral Daily   polyethylene glycol  17 g Oral Daily   senna-docusate  2 tablet Oral Daily   Continuous Infusions:  cefTRIAXone (ROCEPHIN)  IV Stopped (07/26/23 1017)   dextrose 10 % 1,000 mL with potassium chloride 20 mEq infusion 50 mL/hr at 07/26/23 1300   magnesium sulfate bolus IVPB 2 g (07/26/23 1314)   sodium phosphate 15 mmol in dextrose 5 % 250 mL infusion       LOS: 1 day    Time spent: 35 mins    Willeen Niece, MD [facility]   If 7PM-7AM, please contact night-coverage

## 2023-07-26 NOTE — Plan of Care (Signed)
Patient total care alert to self all the time and alert to time intermitted. Patient unable to move self take 2 edema throughout. Decrease in appetite. Prior pressure injury to sacrum  Problem: Education: Goal: Knowledge of General Education information will improve Description: Including pain rating scale, medication(s)/side effects and non-pharmacologic comfort measures Outcome: Not Progressing   Problem: Health Behavior/Discharge Planning: Goal: Ability to manage health-related needs will improve Outcome: Not Progressing   Problem: Clinical Measurements: Goal: Ability to maintain clinical measurements within normal limits will improve 07/26/2023 1104 by Aretta Nip, RN Outcome: Not Progressing 07/26/2023 1104 by Aretta Nip, RN Outcome: Not Progressing Goal: Will remain free from infection 07/26/2023 1104 by Aretta Nip, RN Outcome: Not Progressing 07/26/2023 1104 by Aretta Nip, RN Outcome: Not Progressing Goal: Diagnostic test results will improve 07/26/2023 1104 by Aretta Nip, RN Outcome: Not Progressing 07/26/2023 1104 by Aretta Nip, RN Outcome: Not Progressing Goal: Respiratory complications will improve 07/26/2023 1104 by Aretta Nip, RN Outcome: Not Progressing 07/26/2023 1104 by Aretta Nip, RN Outcome: Not Progressing Goal: Cardiovascular complication will be avoided 07/26/2023 1104 by Aretta Nip, RN Outcome: Not Progressing 07/26/2023 1104 by Aretta Nip, RN Outcome: Not Progressing   Problem: Activity: Goal: Risk for activity intolerance will decrease 07/26/2023 1104 by Aretta Nip, RN Outcome: Not Progressing 07/26/2023 1104 by Aretta Nip, RN Outcome: Not Progressing   Problem: Nutrition: Goal: Adequate nutrition will be maintained 07/26/2023 1104 by Aretta Nip, RN Outcome: Not Progressing 07/26/2023 1104 by Aretta Nip, RN Outcome: Not Progressing   Problem: Coping: Goal: Level of anxiety will  decrease 07/26/2023 1104 by Aretta Nip, RN Outcome: Not Progressing 07/26/2023 1104 by Aretta Nip, RN Outcome: Not Progressing   Problem: Elimination: Goal: Will not experience complications related to bowel motility 07/26/2023 1104 by Aretta Nip, RN Outcome: Not Progressing 07/26/2023 1104 by Aretta Nip, RN Outcome: Not Progressing Goal: Will not experience complications related to urinary retention 07/26/2023 1104 by Aretta Nip, RN Outcome: Not Progressing 07/26/2023 1104 by Aretta Nip, RN Outcome: Not Progressing   Problem: Pain Managment: Goal: General experience of comfort will improve 07/26/2023 1104 by Aretta Nip, RN Outcome: Not Progressing 07/26/2023 1104 by Aretta Nip, RN Outcome: Not Progressing   Problem: Safety: Goal: Ability to remain free from injury will improve 07/26/2023 1104 by Aretta Nip, RN Outcome: Not Progressing 07/26/2023 1104 by Aretta Nip, RN Outcome: Not Progressing   Problem: Skin Integrity: Goal: Risk for impaired skin integrity will decrease 07/26/2023 1104 by Aretta Nip, RN Outcome: Not Progressing 07/26/2023 1104 by Aretta Nip, RN Outcome: Not Progressing   Problem: Education: Goal: Knowledge of risk factors and measures for prevention of condition will improve Outcome: Not Progressing   Problem: Coping: Goal: Psychosocial and spiritual needs will be supported Outcome: Not Progressing   Problem: Respiratory: Goal: Will maintain a patent airway Outcome: Not Progressing Goal: Complications related to the disease process, condition or treatment will be avoided or minimized Outcome: Not Progressing

## 2023-07-27 DIAGNOSIS — U071 COVID-19: Secondary | ICD-10-CM | POA: Diagnosis not present

## 2023-07-27 LAB — BASIC METABOLIC PANEL
Anion gap: 8 (ref 5–15)
BUN: 16 mg/dL (ref 8–23)
CO2: 20 mmol/L — ABNORMAL LOW (ref 22–32)
Calcium: 7.3 mg/dL — ABNORMAL LOW (ref 8.9–10.3)
Chloride: 106 mmol/L (ref 98–111)
Creatinine, Ser: 0.93 mg/dL (ref 0.44–1.00)
GFR, Estimated: >60 mL/min (ref >60)
Glucose, Bld: 104 mg/dL — ABNORMAL HIGH (ref 70–99)
Potassium: 3.4 mmol/L — ABNORMAL LOW (ref 3.5–5.1)
Sodium: 134 mmol/L — ABNORMAL LOW (ref 135–145)

## 2023-07-27 LAB — GLUCOSE, CAPILLARY
Glucose-Capillary: 115 mg/dL — ABNORMAL HIGH (ref 70–99)
Glucose-Capillary: 96 mg/dL (ref 70–99)
Glucose-Capillary: 101 mg/dL — ABNORMAL HIGH (ref 70–99)
Glucose-Capillary: 122 mg/dL — ABNORMAL HIGH (ref 70–99)

## 2023-07-27 LAB — CBC
HCT: 26.8 % — ABNORMAL LOW (ref 36.0–46.0)
Hemoglobin: 8.6 g/dL — ABNORMAL LOW (ref 12.0–15.0)
MCH: 29.9 pg (ref 26.0–34.0)
MCHC: 32.1 g/dL (ref 30.0–36.0)
MCV: 93.1 fL (ref 80.0–100.0)
Platelets: 240 10*3/uL (ref 150–400)
RBC: 2.88 MIL/uL — ABNORMAL LOW (ref 3.87–5.11)
RDW: 16.4 % — ABNORMAL HIGH (ref 11.5–15.5)
WBC: 2.6 10*3/uL — ABNORMAL LOW (ref 4.0–10.5)
nRBC: 0 % (ref 0.0–0.2)

## 2023-07-27 LAB — URINE CULTURE: Culture: >=100000 — AB

## 2023-07-27 LAB — PHOSPHORUS: Phosphorus: 2.8 mg/dL (ref 2.5–4.6)

## 2023-07-27 LAB — MAGNESIUM: Magnesium: 1.8 mg/dL (ref 1.7–2.4)

## 2023-07-27 MED ORDER — SODIUM CHLORIDE 0.9 % IV SOLN
2.0000 g | Freq: Two times a day (BID) | INTRAVENOUS | Status: AC
  Administered 2023-07-27 – 2023-07-30 (×7): 2 g via INTRAVENOUS
  Filled 2023-07-27 (×7): qty 12.5

## 2023-07-27 MED ORDER — POTASSIUM CHLORIDE 20 MEQ PO PACK
40.0000 meq | PACK | Freq: Once | ORAL | Status: AC
  Administered 2023-07-27: 40 meq via ORAL
  Filled 2023-07-27: qty 2

## 2023-07-27 NOTE — Progress Notes (Signed)
PROGRESS NOTE    Tricia Harrison  ZOX:[id number] DOB: 1946/04/17 DOA: 07/24/2023 PCP: Darrow Bussing, MD   Brief Narrative:  This 77 years old female with PMH significant for morbid obesity, type 2 diabetes, history of PE, CKD stage IIIb with baseline creatinine 1.0, hypertension, recent chronic Foley catheter placement due to urinary retention, chronic edema due to hypoalbuminemia, chronic anemia from chronic kidney disease, chronic back pain who was discharged to [facility] home on 07/16/2023.  She presented back from the facility due to reported confusion.  Patient has no history of dementia.  Patient was reportedly diagnosed with COVID yesterday at the facility.  UA also positive for leukocyte Estrace many bacteria.,  Ammonia level is less than 10.  CT head negative for acute abnormality.  Patient blood sugar was also found to be low 62.  Patient is admitted for further evaluation.  Assessment & Plan:   Principal Problem:   COVID-19 virus infection Active Problems:   Acute metabolic encephalopathy   Edema due to hypoalbuminemia   Diabetic hypoglycemia (HCC)   Catheter-associated urinary tract infection (HCC)   Essential hypertension   Hypothyroidism   Morbid obesity (HCC)   Type 2 diabetes mellitus (HCC)   History of pulmonary embolism   Obstructive sleep apnea   Chronic kidney disease, stage 3b (HCC)   Chronic constipation  COVID-19 infection: Patient does not appear hypoxic.Denies any fever. Will hold on Paxlovid for now. Continue supportive care.  Catheter-associated urinary tract infection (HCC) UA consistent with UTI. Continue IV Rocephin.  Urine culture grew 100 K of Enterobacter.   Diabetic hypoglycemia [facility]) May be one of the etiologies of her acute metabolic encephalopathy.  Has required 2 doses of 25 g of IV D50. Continue  low dose D10 gtts. Hold DM meds.   Edema due to hypoalbuminemia: Pt with low total protein and albumin. If needing to diurese,  would add bumex or demadex.   Can also give IV albumin along with IV lasix.   Acute metabolic encephalopathy: Probably due to covid,  hypoglycemia, UTI. CT head negative for acute abnormality. Mental status is improving. She seems improved.   Chronic constipation Continue senna and miralax.  Hx of hemorrhoids with rectal bleeding in the past when she gets constipated.   Chronic kidney disease, stage 3b (HCC): Chronic. Baseline Scr 1.0. Avoid nephrotoxic medications.  Continue hydration   Obstructive sleep apnea: Continue CPAP prn   History of pulmonary embolism: On DVT prophylaxis with 40 mg SQ lovenox.  Per last discharge summary, pt intolerant to restarting Eliquis due to recurrent hematuria.   Type 2 diabetes mellitus (HCC): Hold DM meds due to hypoglycemia.  Hypothyroidism: Stable. Continue synthroid 50 mcg. Check FT4   Essential hypertension: PTA coreg 6.25 mg qday. Dose reduced to 3.125 mg due to bradycardia  Sinus bradycardia: Decrease Coreg to 3.125 mg every 12 hours.   Obesity: Diet and exercise discussed in detail Estimated body mass index is 36.95 kg/m as calculated from the following:   Height as of this encounter: 5\' 1"  (1.549 m).   Weight as of this encounter: 88.7 kg.    DVT prophylaxis: Lovenox Code Status: Full code Family Communication: No family at bed side Disposition Plan:     Status is: Inpatient Remains inpatient appropriate because: Admitted for acute metabolic encephalopathy likely due to UTI , now requiring IV antibiotics. Patient also COVID-positive requiring supportive care.   Consultants:  None  Procedures: None  Antimicrobials:  Anti-infectives (From admission, onward)  Start     Dose/Rate Route Frequency Ordered Stop   07/25/23 2300  cefTRIAXone (ROCEPHIN) 1 g in sodium chloride 0.9 % 100 mL IVPB        1 g 200 mL/hr over 30 Minutes Intravenous Every 24 hours 07/25/23 0430     07/24/23 2145  cefTRIAXone (ROCEPHIN) 1 g  in sodium chloride 0.9 % 100 mL IVPB        1 g 200 mL/hr over 30 Minutes Intravenous  Once 07/24/23 2143 07/24/23 2330      Subjective: Patient was seen and examined at bedside.  Overnight events noted. Patient seems improved.  Appears back to her baseline mental status. Family at bedside states she does have intermittent episodes of disorientation.   Objective: Vitals:   07/26/23 2101 07/27/23 0434 07/27/23 0443 07/27/23 1209  BP: 122/65  126/81 130/71  Pulse: 71  100   Resp: 18  16 18   Temp: 98.8 F (37.1 C)  99.1 F (37.3 C) 98.3 F (36.8 C)  TempSrc: Oral  Oral Oral  SpO2: 100%  99% 97%  Weight:  88.7 kg    Height:        Intake/Output Summary (Last 24 hours) at 07/27/2023 1402 Last data filed at 07/27/2023 1238 Gross per 24 hour  Intake 1291.41 ml  Output 850 ml  Net 441.41 ml   Filed Weights   07/24/23 1811 07/26/23 0500 07/27/23 0434  Weight: 78.9 kg 88.7 kg 88.7 kg    Examination:  General exam: Appears calm and comfortable, deconditioned, not in any acute distress. Respiratory system: Clear to auscultation. Respiratory effort normal. RR 14 Cardiovascular system: S1 & S2 heard, RRR. No JVD, murmurs, rubs, gallops or clicks. Gastrointestinal system: Abdomen is non distended, soft and non tender. Normal bowel sounds heard. Central nervous system: Alert and oriented X 3. No focal neurological deficits. Extremities: Edema+, no cyanosis, no clubbing Skin: No rashes, lesions or ulcers Psychiatry: Judgement and insight appear normal. Mood & affect appropriate.     Data Reviewed: I have personally reviewed following labs and imaging studies  CBC: Recent Labs  Lab 07/24/23 2047 07/26/23 1200 07/27/23 0613  WBC 6.3 3.2* 2.6*  HGB 9.1* 9.0* 8.6*  HCT 28.4* 28.9* 26.8*  MCV 96.6 97.6 93.1  PLT 264 242 240   Basic Metabolic Panel: Recent Labs  Lab 07/24/23 2047 07/26/23 1200 07/27/23 0613  NA 138 134* 134*  K 4.2 3.7 3.4*  CL 108 106 106  CO2 23 21*  20*  GLUCOSE 67* 130* 104*  BUN 23 18 16   CREATININE 0.82 0.87 0.93  CALCIUM 8.2* 7.6* 7.3*  MG  --  1.5* 1.8  PHOS  --  2.3* 2.8   GFR: Estimated Creatinine Clearance: 51.3 mL/min (by C-G formula based on SCr of 0.93 mg/dL). Liver Function Tests: Recent Labs  Lab 07/24/23 2047 07/26/23 1200  AST 128* 69*  ALT 49* 32  ALKPHOS 39 34*  BILITOT 0.6 0.5  PROT 4.9* 4.4*  ALBUMIN 2.0* 1.8*   No results for input(s): "LIPASE", "AMYLASE" in the last 168 hours. Recent Labs  Lab 07/24/23 2150  AMMONIA <10   Coagulation Profile: No results for input(s): "INR", "PROTIME" in the last 168 hours. Cardiac Enzymes: No results for input(s): "CKTOTAL", "CKMB", "CKMBINDEX", "TROPONINI" in the last 168 hours. BNP (last 3 results) No results for input(s): "PROBNP" in the last 8760 hours. HbA1C: No results for input(s): "HGBA1C" in the last 72 hours. CBG: Recent Labs  Lab 07/26/23 1130 07/26/23  1806 07/26/23 2352 07/27/23 0543 07/27/23 1210  GLUCAP 135* 113* 112* 115* 96   Lipid Profile: No results for input(s): "CHOL", "HDL", "LDLCALC", "TRIG", "CHOLHDL", "LDLDIRECT" in the last 72 hours. Thyroid Function Tests: Recent Labs    07/24/23 2047  TSH 7.432*  FREET4 1.17*   Anemia Panel: No results for input(s): "VITAMINB12", "FOLATE", "FERRITIN", "TIBC", "IRON", "RETICCTPCT" in the last 72 hours. Sepsis Labs: No results for input(s): "PROCALCITON", "LATICACIDVEN" in the last 168 hours.  Recent Results (from the past 240 hour(s))  Urine Culture     Status: Abnormal   Collection Time: 07/24/23  8:29 PM   Specimen: Urine, Clean Catch  Result Value Ref Range Status   Specimen Description   Final    URINE, CLEAN CATCH Performed at [facility], [address], [place], [place] [address]    Special Requests   Final    NONE Performed at [facility], [address], [place], [place] [address]    Culture >=100,000 COLONIES/mL ENTEROBACTER  CLOACAE (A)  Final   Report Status 07/27/2023 FINAL  Final   Organism ID, Bacteria ENTEROBACTER CLOACAE (A)  Final      Susceptibility   Enterobacter cloacae - MIC*    CEFEPIME <=0.12 SENSITIVE Sensitive     CIPROFLOXACIN <=0.25 SENSITIVE Sensitive     GENTAMICIN <=1 SENSITIVE Sensitive     IMIPENEM <=0.25 SENSITIVE Sensitive     NITROFURANTOIN 32 SENSITIVE Sensitive     TRIMETH/SULFA <=20 SENSITIVE Sensitive     PIP/TAZO 8 SENSITIVE Sensitive     * >=100,000 COLONIES/mL ENTEROBACTER CLOACAE  SARS Coronavirus 2 by RT PCR (hospital order, performed in [facility] hospital lab) *cepheid single result test* Anterior Nasal Swab     Status: Abnormal   Collection Time: 07/24/23  9:50 PM   Specimen: Anterior Nasal Swab  Result Value Ref Range Status   SARS Coronavirus 2 by RT PCR POSITIVE (A) NEGATIVE Final    Comment: (NOTE) SARS-CoV-2 target nucleic acids are DETECTED  SARS-CoV-2 RNA is generally detectable in upper respiratory specimens  during the acute phase of infection.  Positive results are indicative  of the presence of the identified virus, but do not rule out bacterial infection or co-infection with other pathogens not detected by the test.  Clinical correlation with patient history and  other diagnostic information is necessary to determine patient infection status.  The expected result is negative.  Fact Sheet for Patients:   RoadLapTop.co.za   Fact Sheet for Healthcare Providers:   http://kim-miller.com/    This test is not yet approved or cleared by the [place] FDA and  has been authorized for detection and/or diagnosis of SARS-CoV-2 by FDA under an Emergency Use Authorization (EUA).  This EUA will remain in effect (meaning this test can be used) for the duration of  the COVID-19 declaration under Section 564(b)(1)  of the Act, 21 U.S.C. section 360-bbb-3(b)(1), unless the authorization is terminated or revoked  sooner.   Performed at [facility], [address] [place], [place] [address]    Radiology Studies: DG Swallowing Func-Speech Pathology  Result Date: 07/25/2023 Table formatting from the original result was not included. Modified Barium Swallow Study Patient Details Name: Dylanie Bandemer MRN: [id number] Date of Birth: 1946/08/03 Today's Date: 07/25/2023 HPI/PMH: HPI: 77 year old with history of chronic anemia, chronic back pain, chronic constipation, CKD stage IIIb, hypertension and hyperlipidemia, hypothyroidism, dementia, history of pulmonary embolism recently underwent right shoulder replacement, significant postoperative delirium  and confusion and was ultimately sent to skilled nursing rehab, now admitted from skilled nursing facility with confusion found to have COVID and diabetic hypoglycemia.  Per MD note she also has a UTI.  Patient has undergone prior modified barium swallow studies x 2 and esophagram.  Most recent MBS was conducted 07/09/2023 and she was noted to have mild oropharyngeal dysphagia with impaired pharyngeal stripping and impaired tongue base retraction resulting in retention.  Requiring several swallows to diminish pharyngeal retention.  In addition prior esophagram shows patulous esophagus, esoph dysmotlity, small hiatal hernia and trace reflux.  Prior BSE with possible velopharyngeal dysfunction with hypernasality and complaints of nasal regugitation. MBS 01/14/22 without penetration or aspiration but possible decreased UES opening with barium pooled above UES.  Patient chest x-ray showed left more than right infiltrate.  Patient has undergone prior brain imaging and current CT scan was negative for acute change.  She does endorse worsening of her swallowing in the last 6 months.  Nurse reports she did not pass her Yale swallow screening and that family states patient has a chronic dysphagia. Clinical Impression: Clinical Impression: Pt demonstrates mild  oropharyngeal dysphagia consistent with prior MBS.  Her motility deficits do not result in laryngeal penetration or aspiration but allow significant retention.  Oral transiting continues to be impaired - more with increased viscocity.  Velopharyngeal function, pharyngeal stripping wave and tongue base retraction deficits allow pharyngeal retention that pt requires a multitude of swallows to clear (up to 4).  A-P view challenging as pt was leaning to the right significantly.  Various postures including head turn left and chin tuck did not improve oropharyngeal clearance.    Chopped meats, Dys 3. Continue thin liquids, multiple swallows and alternate liquids and solids.  Did not test a barium tablet - but recommend meds with applesauce - whole if small, consider crushed if large.  SLP questions source of pt's pharyngeal dysphagia - however prior brain imaging showed Mild chronic small vessel ischemic disease. Small lacunar infarcts in the left basal ganglia- ? if this could be the source? Factors that may increase risk of adverse event in presence of aspiration Rubye Oaks & Clearance Coots 2021): Factors that may increase risk of adverse event in presence of aspiration Rubye Oaks & Clearance Coots 2021): Reduced saliva; Weak cough Recommendations/Plan: Swallowing Evaluation Recommendations Swallowing Evaluation Recommendations Recommendations: PO diet PO Diet Recommendation: Dysphagia 3 (Mechanical soft); Thin liquids (Level 0) Liquid Administration via: Cup; Straw Medication Administration: Crushed with puree Supervision: Patient able to self-feed; Intermittent supervision/cueing for swallowing strategies Swallowing strategies  : Multiple dry swallows after each bite/sip; Small bites/sips; Slow rate Postural changes: Position pt fully upright for meals Oral care recommendations: Oral care BID (2x/day) Caregiver Recommendations: Have oral suction available Treatment Plan Treatment Plan Treatment recommendations: Therapy as outlined in  treatment plan below Follow-up recommendations: Skilled nursing-short term rehab (<3 hours/day) Functional status assessment: Patient has had a recent decline in their functional status and demonstrates the ability to make significant improvements in function in a reasonable and predictable amount of time. Treatment frequency: Min 1x/week Treatment duration: 2 weeks Interventions: Aspiration precaution training; Patient/family education; Diet toleration management by SLP Recommendations Recommendations for follow up therapy are one component of a multi-disciplinary discharge planning process, led by the attending physician.  Recommendations may be updated based on patient status, additional functional criteria and insurance authorization. Assessment: Orofacial Exam: Orofacial Exam Oral Cavity: Oral Hygiene: Pooled secretions Oral Cavity - Dentition: Adequate natural dentition Oral Motor/Sensory Function: Suspected cranial nerve impairment  CN V - Trigeminal: WFL CN VII - Facial: WFL CN IX - Glossopharyngeal, CN X - Vagus: Left motor impairment (Palatal deviation to the right upon phonation) CN XII - Hypoglossal: -- (Generalized weakness) Anatomy: Anatomy: WFL Boluses Administered: Boluses Administered Boluses Administered: Thin liquids (Level 0); Mildly thick liquids (Level 2, nectar thick); Moderately thick liquids (Level 3, honey thick); Puree; Solid  Oral Impairment Domain: Oral Impairment Domain Lip Closure: No labial escape Tongue control during bolus hold: Cohesive bolus between tongue to palatal seal Bolus preparation/mastication: Slow prolonged chewing/mashing with complete recollection Bolus transport/lingual motion: Delayed initiation of tongue motion (oral holding) Oral residue: Residue collection on oral structures Location of oral residue : Tongue Initiation of pharyngeal swallow : Valleculae  Pharyngeal Impairment Domain: Pharyngeal Impairment Domain Soft palate elevation: Trace column of contrast or  air between SP and PW Laryngeal elevation: Complete superior movement of thyroid cartilage with complete approximation of arytenoids to epiglottic petiole Anterior hyoid excursion: Complete anterior movement Epiglottic movement: Complete inversion Laryngeal vestibule closure: Complete, no air/contrast in laryngeal vestibule Pharyngeal stripping wave : Present - diminished Pharyngeal contraction (A/P view only): -- (difficult view due to pt leaning to the right, thus retention in the right more than left pharynx post-swallow - and pt was tilted posterior impacting adequacy of view) Pharyngoesophageal segment opening: Partial distention/partial duration, partial obstruction of flow Tongue base retraction: Trace column of contrast or air between tongue base and PPW Pharyngeal residue: Collection of residue within or on pharyngeal structures Location of pharyngeal residue: Valleculae; Pharyngeal wall; Pyriform sinuses  Esophageal Impairment Domain: Esophageal Impairment Domain Esophageal clearance upright position: Complete clearance, esophageal coating Pill: No data recorded Penetration/Aspiration Scale Score: Penetration/Aspiration Scale Score 1.  Material does not enter airway: Thin liquids (Level 0); Mildly thick liquids (Level 2, nectar thick); Moderately thick liquids (Level 3, honey thick); Solid; Puree Compensatory Strategies: No data recorded  General Information: Caregiver present: No  Diet Prior to this Study: NPO   Temperature : Normal   Respiratory Status: WFL   Supplemental O2: Nasal cannula   History of Recent Intubation: No  Behavior/Cognition: Alert; Cooperative Self-Feeding Abilities: Dependent for feeding Baseline vocal quality/speech: Abnormal resonance Volitional Cough: Able to elicit Volitional Swallow: Able to elicit Exam Limitations: No limitations Goal Planning: Prognosis for improved oropharyngeal function: Good No data recorded No data recorded Patient/Family Stated Goal: Patient reports  problems swallowing more pronounced in the last 6 months Consulted and agree with results and recommendations: Patient Pain: Pain Assessment Faces Pain Scale: 4 Negative Vocalization: 0 Facial Expression: 0 Body Language: 0 Consolability: 0 Pain Location: -- (Her shoulder and bottom) Pain Descriptors / Indicators: Discomfort Pain Intervention(s): Limited activity within patient's tolerance End of Session: Start Time:SLP Start Time (ACUTE ONLY): 1450 Stop Time: SLP Stop Time (ACUTE ONLY): 1525 Time Calculation:SLP Time Calculation (min) (ACUTE ONLY): 35 min Charges: SLP Evaluations $ SLP Speech Visit: 1 Visit SLP Evaluations $BSS Swallow: 1 Procedure $MBS Swallow: 1 Procedure $Swallowing Treatment: 1 Procedure SLP visit diagnosis: SLP Visit Diagnosis: Dysphagia, oropharyngeal phase (R13.12) Past Medical History: Past Medical History: Diagnosis Date  Anemia   as a child  Anginal pain (HCC)   Asthma   as a child  Bladder incontinence   Chronic back pain   spinal stenosis and buldging disc;scoliosis  Chronic constipation   occasionally takes something OTC  Chronic kidney disease   Diabetes mellitus without complication (HCC)   per pt "Pre" for 20+yrs  Diverticulosis   DVT (deep venous thrombosis) (HCC)  Dyspnea   occasional  Gallstones   has known about this for 3-35yrs  GERD (gastroesophageal reflux disease)   takes Omeprazole daily  Glaucoma   H/O hiatal hernia   Heart murmur   Hemorrhoids   History of blood transfusion   no abnormal reaction noted  History of bronchitis   states its been a long time ago  History of gout   HLD (hyperlipidemia)   takes Fenofibrate daily  HTN (hypertension)   takes Diltiazem and Ramipril daily  Hypothyroidism   takes Synthroid daily  Left knee DJD   Macular degeneration   Nocturia   Palpitations   started in 2013 and only occasionally;last time noticed about 2-3wks ago and after drinking caffeine  Peripheral vascular disease (HCC)   PONV (postoperative nausea and vomiting)   problems  swallowing after anethesia- 2/2023and slurred speech also  Pulmonary embolism with acute cor pulmonale (HCC) 07/2018  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  Sleep apnea   does not uses cpap Past Surgical History: Past Surgical History: Procedure Laterality Date  3-day EVENT MONITOR  01/2019  Mostly normal monitor.  Predominantly sinus rhythm (rate range 48-105 bpm, average 67 bpm) 9 short atrial runs (fastest = 13 beats rate 135 bpm, longest ~17 seconds-101 bpm) -> not noted on symptom log.  No sustained arrhythmias (tachycardia or bradycardia), or pauses..  Symptoms are noted with PVCs.  bilateral cataract surgery     CHOLECYSTECTOMY N/A 01/10/2022  Procedure: LAPAROSCOPIC CHOLECYSTECTOMY WITH INTRAOPERATIVE CHOLANGIOGRAM;  Surgeon: Karie Soda, MD;  Location: [facility] ORS;  Service: General;  Laterality: N/A;  COLONOSCOPY    CT ANGIOGRAM OF CHEST - PE PROTOCOL  07/2018  Bilateral nonocclusive pulmonary emboli evidence of right heart strain consistent with "submassive "/intermediate PE.  -->  Code PE called.  DILATION AND CURETTAGE OF UTERUS    multiple about 6-7 times  ESOPHAGOGASTRODUODENOSCOPY    FLEXIBLE SIGMOIDOSCOPY N/A 07/09/2023  Procedure: FLEXIBLE SIGMOIDOSCOPY;  Surgeon: Charlott Rakes, MD;  Location: [facility] ENDOSCOPY;  Service: Gastroenterology;  Laterality: N/A;  NM VQ LUNG SCAN ([facility] HX)  11/23/2018  Low probability for PE  REVERSE SHOULDER ARTHROPLASTY Right 06/17/2023  Procedure: RIGHT REVERSE SHOULDER ARTHROPLASTY;  Surgeon: Teryl Lucy, MD;  Location: [facility] ORS;  Service: Orthopedics;  Laterality: Right;  RIGHT/LEFT HEART CATH AND CORONARY ANGIOGRAPHY N/A 08/19/2018  Procedure: RIGHT/LEFT HEART CATH AND CORONARY ANGIOGRAPHY;  Surgeon: Corky Crafts, MD;  Location: [facility] INVASIVE CV LAB;  Service: Cardiovascular:: Nonobstructive CAD.  Mild pulmonary pretension.  Mean PA pressure 26 mmHg with PA P 48/20 mmHg.  Normal LVEDP.  THYROID  SURGERY Right 2010  TOTAL HIP ARTHROPLASTY Left 2005  TOTAL KNEE ARTHROPLASTY Left 02/21/2014  Procedure: TOTAL KNEE ARTHROPLASTY;  Surgeon: Nilda Simmer, MD;  Location: [facility] OR;  Service: Orthopedics;  Laterality: Left;  TRANSTHORACIC ECHOCARDIOGRAM  07/2018   (In setting of acute PE) mild LVH.  EF 55 to 60%.  GR 1 DD.  Aortic sclerosis but no stenosis.  Mild regurgitation.  Mild RA dilation.  Moderate LA dilation.  Moderate tricuspid regurgitation with severe primary hypertension estimated PA pressure 71 mmHg.  RV poorly visualized  TRANSTHORACIC ECHOCARDIOGRAM  10/2018 - 11/2019  a) EF 60-65%.  GR 1 DD.  Focal basal LVH. Mod AI/ no AS. Mild LA dilation.  Normal RV size and fxn.  Mod PI w/ mild TR.  Peak PAP ~ 38 mmHg; b) Normal EF 60 to 65%.  Moderate LVH-GR 1 DD.  R WMA.  Mild AS w/ trivial AI.  Grossly normal PA, RV and RA size.  Normal RV function.  Normal PA pressures.  TRANSTHORACIC ECHOCARDIOGRAM  01/2021   EF 55%.  Normal LV function.  Mild LVH.  GR 1 DD.  Normal PA pressures.  Severe LA dilation.  (Consistent with more significant diastolic dysfunction).  Mild-possibly mild to moderate aortic insufficiency.  Borderline elevated RAP.  Trivial pericardial effusion.  (Stable)  VAGINAL HYSTERECTOMY  1979 Rolena Infante, MS [facility] SLP Acute Rehab Services Office [phone] Chales Abrahams 07/25/2023, 5:15 PM   Scheduled Meds:  carvedilol  3.125 mg Oral Daily   Chlorhexidine Gluconate Cloth  6 each Topical Daily   enoxaparin (LOVENOX) injection  40 mg Subcutaneous Q24H   levothyroxine  50 mcg Oral QAC breakfast   pantoprazole  40 mg Oral Daily   polyethylene glycol  17 g Oral Daily   senna-docusate  2 tablet Oral Daily   Continuous Infusions:  cefTRIAXone (ROCEPHIN)  IV 1 g (07/26/23 2322)   dextrose 10 % 1,000 mL with potassium chloride 20 mEq infusion 50 mL/hr at 07/26/23 1700     LOS: 2 days    Time spent: 35 mins    Willeen Niece, MD [facility]   If 7PM-7AM, please contact  night-coverage

## 2023-07-27 NOTE — Plan of Care (Signed)
  Problem: Clinical Measurements: Goal: Ability to maintain clinical measurements within normal limits will improve Outcome: Progressing   

## 2023-07-27 NOTE — Plan of Care (Signed)
?  Problem: Education: ?Goal: Knowledge of General Education information will improve ?Description: Including pain rating scale, medication(s)/side effects and non-pharmacologic comfort measures ?Outcome: Progressing ?  ?Problem: Health Behavior/Discharge Planning: ?Goal: Ability to manage health-related needs will improve ?Outcome: Progressing ?  ?Problem: Clinical Measurements: ?Goal: Ability to maintain clinical measurements within normal limits will improve ?Outcome: Progressing ?Goal: Will remain free from infection ?Outcome: Progressing ?Goal: Diagnostic test results will improve ?Outcome: Progressing ?Goal: Respiratory complications will improve ?Outcome: Progressing ?Goal: Cardiovascular complication will be avoided ?Outcome: Progressing ?  ?Problem: Activity: ?Goal: Risk for activity intolerance will decrease ?Outcome: Progressing ?  ?Problem: Nutrition: ?Goal: Adequate nutrition will be maintained ?Outcome: Progressing ?  ?Problem: Coping: ?Goal: Level of anxiety will decrease ?Outcome: Progressing ?  ?Problem: Elimination: ?Goal: Will not experience complications related to bowel motility ?Outcome: Progressing ?Goal: Will not experience complications related to urinary retention ?Outcome: Progressing ?  ?Problem: Pain Managment: ?Goal: General experience of comfort will improve ?Outcome: Progressing ?  ?Problem: Safety: ?Goal: Ability to remain free from injury will improve ?Outcome: Progressing ?  ?Problem: Skin Integrity: ?Goal: Risk for impaired skin integrity will decrease ?Outcome: Progressing ?  ?Problem: Education: ?Goal: Knowledge of risk factors and measures for prevention of condition will improve ?Outcome: Progressing ?  ?

## 2023-07-28 DIAGNOSIS — U071 COVID-19: Secondary | ICD-10-CM | POA: Diagnosis not present

## 2023-07-28 LAB — GLUCOSE, CAPILLARY
Glucose-Capillary: 115 mg/dL — ABNORMAL HIGH (ref 70–99)
Glucose-Capillary: 91 mg/dL (ref 70–99)
Glucose-Capillary: 98 mg/dL (ref 70–99)

## 2023-07-28 MED ORDER — TRAMADOL HCL 50 MG PO TABS
50.0000 mg | ORAL_TABLET | ORAL | Status: DC | PRN
  Administered 2023-07-28 – 2023-08-06 (×4): 50 mg via ORAL
  Filled 2023-07-28 (×6): qty 1

## 2023-07-28 MED ORDER — POTASSIUM CHLORIDE 20 MEQ PO PACK
40.0000 meq | PACK | Freq: Once | ORAL | Status: AC
  Administered 2023-07-28: 40 meq via ORAL
  Filled 2023-07-28: qty 2

## 2023-07-28 NOTE — Plan of Care (Signed)

## 2023-07-28 NOTE — Plan of Care (Signed)
  Problem: Clinical Measurements: Goal: Respiratory complications will improve Outcome: Progressing   Problem: Pain Managment: Goal: General experience of comfort will improve Outcome: Progressing   Problem: Skin Integrity: Goal: Risk for impaired skin integrity will decrease Outcome: Progressing   Problem: Education: Goal: Knowledge of risk factors and measures for prevention of condition will improve Outcome: Progressing   Problem: Respiratory: Goal: Will maintain a patent airway Outcome: Progressing   Problem: Respiratory: Goal: Complications related to the disease process, condition or treatment will be avoided or minimized Outcome: Progressing   Problem: Health Behavior/Discharge Planning: Goal: Ability to manage health-related needs will improve Outcome: Not Progressing   Problem: Nutrition: Goal: Adequate nutrition will be maintained Outcome: Not Progressing   Problem: Elimination: Goal: Will not experience complications related to bowel motility Outcome: Not Progressing

## 2023-07-28 NOTE — Progress Notes (Signed)
PROGRESS NOTE    Tricia Harrison  ION:[id number] DOB: Aug 05, 1946 DOA: 07/24/2023 PCP: Darrow Bussing, MD   Brief Narrative:  This 77 years old female with PMH significant for morbid obesity, type 2 diabetes, history of PE, CKD stage IIIb with baseline creatinine 1.0, hypertension, recent chronic Foley catheter placement due to urinary retention, chronic edema due to hypoalbuminemia, chronic anemia from chronic kidney disease, chronic back pain who was discharged to [facility] nursing home on 07/16/2023.  She presented back from the facility due to reported confusion.  Patient has no history of dementia.  Patient was reportedly diagnosed with COVID yesterday at the facility.  UA also positive for leukocyte Estrace many bacteria. Ammonia level is less than 10. CT head negative for acute abnormality.  Patient blood sugar was also found to be low 62.  Patient is admitted for further evaluation.  Assessment & Plan:   Principal Problem:   COVID-19 virus infection Active Problems:   Acute metabolic encephalopathy   Edema due to hypoalbuminemia   Diabetic hypoglycemia (HCC)   Catheter-associated urinary tract infection (HCC)   Essential hypertension   Hypothyroidism   Morbid obesity (HCC)   Type 2 diabetes mellitus (HCC)   History of pulmonary embolism   Obstructive sleep apnea   Chronic kidney disease, stage 3b (HCC)   Chronic constipation  COVID-19 infection: Patient does not appear hypoxic. Denies any fever. Will hold on Paxlovid for now. Continue supportive care.  Catheter-associated urinary tract infection (HCC) UA consistent with UTI. Initiated on IV Rocephin.Urine culture grew 100 K of Enterobacter. Antibiotics changed to cefepime.   Diabetic hypoglycemia [facility]) May be one of the etiologies of her acute metabolic encephalopathy.  Has required 2 doses of 25 g of IV D50.  Continue low dose D10 gtts. Hold DM meds.   Edema due to hypoalbuminemia: Pt with low total protein and  albumin. If needing to diurese, would add bumex or demadex.   Can also give IV albumin along with IV lasix.   Acute metabolic encephalopathy: Probably due to covid,  hypoglycemia, UTI. CT head negative for acute abnormality. She seems improved and back to baseline   Chronic constipation Continue senna and miralax.  Hx of hemorrhoids with rectal bleeding in the past when she gets constipated.   Chronic kidney disease, stage 3b (HCC): Chronic. Baseline Scr 1.0. Avoid nephrotoxic medications.  Continue hydration   Obstructive sleep apnea: Continue CPAP prn   History of pulmonary embolism: On DVT prophylaxis with 40 mg SQ lovenox.  Per last discharge summary, pt intolerant to restarting Eliquis due to recurrent hematuria.   Type 2 diabetes mellitus (HCC): Hold DM meds due to hypoglycemia.  Hypothyroidism: Stable. Continue synthroid 50 mcg. Check FT4   Essential hypertension: PTA coreg 6.25 mg qday. Dose reduced to 3.125 mg due to bradycardia.  Sinus bradycardia: Decrease Coreg to 3.125 mg every 12 hours.   Obesity: Diet and exercise discussed in detail Estimated body mass index is 37.78 kg/m as calculated from the following:   Height as of this encounter: 5\' 1"  (1.549 m).   Weight as of this encounter: 90.7 kg.    DVT prophylaxis: Lovenox Code Status: Full code Family Communication: No family at bed side Disposition Plan:     Status is: Inpatient Remains inpatient appropriate because: Admitted for acute metabolic encephalopathy likely due to UTI , now requiring IV antibiotics. Patient also COVID-positive requiring supportive care.   Consultants:  None  Procedures: None  Antimicrobials:  Anti-infectives (From admission, onward)  Start     Dose/Rate Route Frequency Ordered Stop   07/27/23 1700  ceFEPIme (MAXIPIME) 2 g in sodium chloride 0.9 % 100 mL IVPB        2 g 200 mL/hr over 30 Minutes Intravenous Every 12 hours 07/27/23 1523     07/25/23 2300   cefTRIAXone (ROCEPHIN) 1 g in sodium chloride 0.9 % 100 mL IVPB  Status:  Discontinued        1 g 200 mL/hr over 30 Minutes Intravenous Every 24 hours 07/25/23 0430 07/27/23 1523   07/24/23 2145  cefTRIAXone (ROCEPHIN) 1 g in sodium chloride 0.9 % 100 mL IVPB        1 g 200 mL/hr over 30 Minutes Intravenous  Once 07/24/23 2143 07/24/23 2330      Subjective: Patient was seen and examined at bedside.  Overnight events noted. Patient seems much improved.  She is back to her baseline mental status. Family at bedside states she does have intermittent episodes of disorientation.   Objective: Vitals:   07/27/23 2044 07/28/23 0500 07/28/23 0603 07/28/23 1226  BP: 109/88  124/81 128/83  Pulse: (!) 110  81 83  Resp: 18  16 16   Temp: 98.2 F (36.8 C)  99.3 F (37.4 C) 97.7 F (36.5 C)  TempSrc: Oral  Oral Oral  SpO2: 98%  99% 98%  Weight:  90.7 kg    Height:        Intake/Output Summary (Last 24 hours) at 07/28/2023 1327 Last data filed at 07/28/2023 0942 Gross per 24 hour  Intake 1288.11 ml  Output --  Net 1288.11 ml   Filed Weights   07/26/23 0500 07/27/23 0434 07/28/23 0500  Weight: 88.7 kg 88.7 kg 90.7 kg    Examination:  General exam: Appears comfortable, deconditioned, not in any acute distress. Respiratory system: CTA bilaterally . Respiratory effort normal. RR 15 Cardiovascular system: S1 & S2 heard, RRR. No JVD, murmurs, rubs, gallops or clicks. Gastrointestinal system: Abdomen is non distended, soft and non tender. Normal bowel sounds heard. Central nervous system: Alert and oriented X 3. No focal neurological deficits. Extremities: Edema+, no cyanosis, no clubbing Skin: No rashes, lesions or ulcers Psychiatry: Judgement and insight appear normal. Mood & affect appropriate.     Data Reviewed: I have personally reviewed following labs and imaging studies  CBC: Recent Labs  Lab 07/24/23 2047 07/26/23 1200 07/27/23 0613  WBC 6.3 3.2* 2.6*  HGB 9.1* 9.0* 8.6*   HCT 28.4* 28.9* 26.8*  MCV 96.6 97.6 93.1  PLT 264 242 240   Basic Metabolic Panel: Recent Labs  Lab 07/24/23 2047 07/26/23 1200 07/27/23 0613  NA 138 134* 134*  K 4.2 3.7 3.4*  CL 108 106 106  CO2 23 21* 20*  GLUCOSE 67* 130* 104*  BUN 23 18 16   CREATININE 0.82 0.87 0.93  CALCIUM 8.2* 7.6* 7.3*  MG  --  1.5* 1.8  PHOS  --  2.3* 2.8   GFR: Estimated Creatinine Clearance: 52 mL/min (by C-G formula based on SCr of 0.93 mg/dL). Liver Function Tests: Recent Labs  Lab 07/24/23 2047 07/26/23 1200  AST 128* 69*  ALT 49* 32  ALKPHOS 39 34*  BILITOT 0.6 0.5  PROT 4.9* 4.4*  ALBUMIN 2.0* 1.8*   No results for input(s): "LIPASE", "AMYLASE" in the last 168 hours. Recent Labs  Lab 07/24/23 2150  AMMONIA <10   Coagulation Profile: No results for input(s): "INR", "PROTIME" in the last 168 hours. Cardiac Enzymes: No results for  input(s): "CKTOTAL", "CKMB", "CKMBINDEX", "TROPONINI" in the last 168 hours. BNP (last 3 results) No results for input(s): "PROBNP" in the last 8760 hours. HbA1C: No results for input(s): "HGBA1C" in the last 72 hours. CBG: Recent Labs  Lab 07/27/23 0543 07/27/23 1210 07/27/23 1811 07/27/23 2044 07/28/23 1226  GLUCAP 115* 96 101* 122* 115*   Lipid Profile: No results for input(s): "CHOL", "HDL", "LDLCALC", "TRIG", "CHOLHDL", "LDLDIRECT" in the last 72 hours. Thyroid Function Tests: No results for input(s): "TSH", "T4TOTAL", "FREET4", "T3FREE", "THYROIDAB" in the last 72 hours.  Anemia Panel: No results for input(s): "VITAMINB12", "FOLATE", "FERRITIN", "TIBC", "IRON", "RETICCTPCT" in the last 72 hours. Sepsis Labs: No results for input(s): "PROCALCITON", "LATICACIDVEN" in the last 168 hours.  Recent Results (from the past 240 hour(s))  Urine Culture     Status: Abnormal   Collection Time: 07/24/23  8:29 PM   Specimen: Urine, Clean Catch  Result Value Ref Range Status   Specimen Description   Final    URINE, CLEAN CATCH Performed at  [facility], [address] [place], [place] [address]    Special Requests   Final    NONE Performed at [facility], [address], [place], [place] [address]    Culture >=100,000 COLONIES/mL ENTEROBACTER CLOACAE (A)  Final   Report Status 07/27/2023 FINAL  Final   Organism ID, Bacteria ENTEROBACTER CLOACAE (A)  Final      Susceptibility   Enterobacter cloacae - MIC*    CEFEPIME <=0.12 SENSITIVE Sensitive     CIPROFLOXACIN <=0.25 SENSITIVE Sensitive     GENTAMICIN <=1 SENSITIVE Sensitive     IMIPENEM <=0.25 SENSITIVE Sensitive     NITROFURANTOIN 32 SENSITIVE Sensitive     TRIMETH/SULFA <=20 SENSITIVE Sensitive     PIP/TAZO 8 SENSITIVE Sensitive     * >=100,000 COLONIES/mL ENTEROBACTER CLOACAE  SARS Coronavirus 2 by RT PCR (hospital order, performed in [facility] hospital lab) *cepheid single result test* Anterior Nasal Swab     Status: Abnormal   Collection Time: 07/24/23  9:50 PM   Specimen: Anterior Nasal Swab  Result Value Ref Range Status   SARS Coronavirus 2 by RT PCR POSITIVE (A) NEGATIVE Final    Comment: (NOTE) SARS-CoV-2 target nucleic acids are DETECTED  SARS-CoV-2 RNA is generally detectable in upper respiratory specimens  during the acute phase of infection.  Positive results are indicative  of the presence of the identified virus, but do not rule out bacterial infection or co-infection with other pathogens not detected by the test.  Clinical correlation with patient history and  other diagnostic information is necessary to determine patient infection status.  The expected result is negative.  Fact Sheet for Patients:   RoadLapTop.co.za   Fact Sheet for Healthcare Providers:   http://kim-miller.com/    This test is not yet approved or cleared by the [place] FDA and  has been authorized for detection and/or diagnosis of SARS-CoV-2 by FDA under an Emergency Use  Authorization (EUA).  This EUA will remain in effect (meaning this test can be used) for the duration of  the COVID-19 declaration under Section 564(b)(1)  of the Act, 21 U.S.C. section 360-bbb-3(b)(1), unless the authorization is terminated or revoked sooner.   Performed at [facility], [address] [place], [place] [address]    Radiology Studies: No results found.  Scheduled Meds:  carvedilol  3.125 mg Oral Daily   Chlorhexidine Gluconate Cloth  6 each Topical Daily   enoxaparin (  LOVENOX) injection  40 mg Subcutaneous Q24H   levothyroxine  50 mcg Oral QAC breakfast   pantoprazole  40 mg Oral Daily   polyethylene glycol  17 g Oral Daily   senna-docusate  2 tablet Oral Daily   Continuous Infusions:  ceFEPime (MAXIPIME) IV 2 g (07/28/23 0429)   dextrose 10 % 1,000 mL with potassium chloride 20 mEq infusion 50 mL/hr at 07/27/23 2021     LOS: 3 days    Time spent: 35 mins    Willeen Niece, MD [facility]   If 7PM-7AM, please contact night-coverage

## 2023-07-29 ENCOUNTER — Inpatient Hospital Stay (HOSPITAL_COMMUNITY): Payer: Medicare HMO

## 2023-07-29 DIAGNOSIS — U071 COVID-19: Secondary | ICD-10-CM | POA: Diagnosis not present

## 2023-07-29 LAB — GLUCOSE, CAPILLARY
Glucose-Capillary: 100 mg/dL — ABNORMAL HIGH (ref 70–99)
Glucose-Capillary: 100 mg/dL — ABNORMAL HIGH (ref 70–99)
Glucose-Capillary: 105 mg/dL — ABNORMAL HIGH (ref 70–99)
Glucose-Capillary: 110 mg/dL — ABNORMAL HIGH (ref 70–99)
Glucose-Capillary: 89 mg/dL (ref 70–99)

## 2023-07-29 MED ORDER — ACETAMINOPHEN 10 MG/ML IV SOLN
650.0000 mg | Freq: Once | INTRAVENOUS | Status: AC
  Administered 2023-07-29: 650 mg via INTRAVENOUS
  Filled 2023-07-29: qty 65

## 2023-07-29 MED ORDER — CARVEDILOL 6.25 MG PO TABS
6.2500 mg | ORAL_TABLET | Freq: Every day | ORAL | Status: DC
  Administered 2023-08-02 – 2023-08-06 (×5): 6.25 mg via ORAL
  Filled 2023-07-29 (×4): qty 1

## 2023-07-29 MED ORDER — POTASSIUM CHLORIDE 20 MEQ PO PACK
40.0000 meq | PACK | Freq: Once | ORAL | Status: AC
  Administered 2023-07-29: 40 meq via ORAL
  Filled 2023-07-29: qty 2

## 2023-07-29 NOTE — Progress Notes (Addendum)
       Overnight   NAME: Tricia Harrison MRN: [id number] DOB : Apr 25, 1946    Date of Service   07/29/2023   HPI/Events of Note     Notified by attending for bedside visit, question of seizure, reduced responsiveness.    77 year old female past medical history of morbid obesity, PE, CKD stage IIIb, hypertension, recent Foley catheter placement due to urinary retention, chronic edema due to hypoalbuminemia, chronic anemia from chronic kidney disease, chronic back pain. Currently at [facility] nursing home as of 07/16/2023. Admitted through ER due to confusion No history of dementia.  Reported as COVID-positive as of 07/28/2023 from facility. UA positive for leukocyte Estrace with many bacteria. Ammonia less than 10. Original CT of the head is negative for acute abnormality.  Original patient blood sugar was below 62 requiring D50  Principal problem status COVID-19 virus infection, Active problems listed as: acute metabolic encephalopathy Edema due to hypoalbuminemia catheter associated urinary tract infection Essential hypertension Hypothyroidism Morbid obesity History of PE Obstructive sleep apnea Chronic kidney disease stage IIIb Chronic constipation =================================================== Vitals are maintained  BP    110/72 HR      84 RR      14 SpO2  99 room air Temp 99.4 F axillary --- Tylenol ordered  CBG rechecked   --- 110  Head CT ordered - pending  No recent narcotics or benzodiazepines.  Patient has movement of extremities with weak but equal grips bilateral Patient responsiveness did increase slightly during visit and upon transport to CT and return. Transport acknowledged interaction during transportation to from CT.  Eyes appear equal.  Of note. she has a history of macular degeneration, so approach from side may be warranted.  There was no obvious postictal state on arrival to bedside    Interventions/ Plan   CT Head - pending   Telemetry ordered Continue previous orders       Update:  2318 Hrs.  Imaging (in part)  IMPRESSION: 1. No acute intracranial abnormality. 2. 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 3. Scattered layering secretions and mucosal thickening about the sphenoid ethmoidal and maxillary sinuses. Clinical correlation for acute sinusitis recommended.     Electronically Signed   By: [name] M.D.   On: 07/29/2023 22:50 =======================================================  Principal Problem:   COVID-19 virus infection Active Problems:   Acute metabolic encephalopathy  Catheter-associated urinary tract infection  UA consistent with UTI. Urine culture grew 100 K of Enterobacter. Currently on Cefepime  [name] BSN MSNA MSN ACNPC-AG Acute Care Nurse Practitioner [facility]

## 2023-07-29 NOTE — Progress Notes (Addendum)
Speech Language Pathology Treatment: Dysphagia  Patient Details Name: Tricia Harrison MRN: [id number] DOB: Jun 05, 1946 Today's Date: 07/29/2023 Time: 1250-1310 SLP Time Calculation (min) (ACUTE ONLY): 20 min  Assessment / Plan / Recommendation Clinical Impression  Pt seen to assess po tolerance after MBS on 4 days prior. PO documented as 65% maximum on 8/31 and has decreased since then - from 35% to 25% to 5% over the weekend. Today pt is moaning and needing total cues to allow oral care, toothbrushing- using hand over hand assistance.  Pt requires several swallows per bolus - and needs extra time to conduct dry swallow.   Subtle delayed cough after intake of larger bolus of thin via straw.  Uncertain to source of her dysphagia - especially velopharyngeal and pharyngeal motility deficits.  Pt reported to MD th at she had dysphagia following her gallbladder surgery. She also underwent MRI brain for gait d/o, tremors, and h/o falls - for which neuro MD documented absence of anything acute nor anything to account for tremors.   With max cues, pt did swallow single ice chips and took medication crushed - however lingual retention of applesauce with meds noted - without her awareness. She then required several boluses of ice chips and thin to fully clear.  Pt's current intake will not be adequate for hydration/nutrition and question if her potential baseline cognitive deficits with current exacerbation may contribute.  She would benefit from consideration for palliative to establish GOC given her repeated hospital admits this month.   Recommend continue current diet with strict precautions - encouraging po and maximizing liquid nutrition.    Per note from dc 07/16/2023,  pt was to be referred to OP neuro to have evaluation for progressive cognitive decline.  Recommend maintain appointment.     HPI HPI: 77 year old with history of chronic anemia, chronic back pain, chronic constipation, CKD stage IIIb,  hypertension and hyperlipidemia, hypothyroidism, history of pulmonary embolism recently underwent right shoulder replacement, significant postoperative delirium and confusion and was ultimately sent to skilled nursing rehab, now admitted from skilled nursing facility with confusion found to have COVID and diabetic hypoglycemia.  Per MD note she also has a UTI.  Patient has undergone prior modified barium swallow studies x 2 and esophagram.  Most recent MBS was conducted 07/09/2023 and she was noted to have mild oropharyngeal dysphagia with impaired pharyngeal stripping and impaired tongue base retraction resulting in retention.  Requiring several swallows to diminish pharyngeal retention.  In addition prior esophagram shows patulous esophagus, esoph dysmotlity, small hiatal hernia and trace reflux.  Prior BSE with possible velopharyngeal dysfunction with hypernasality and complaints of nasal regugitation. MBS 01/14/22 without penetration or aspiration but possible decreased UES opening with barium pooled above UES.  Patient chest x-ray showed left more than right infiltrate.  Patient has undergone prior brain imaging and current CT scan was negative for acute change.  She does endorse worsening of her swallowing in the last 6 months.  Nurse reports she did not pass her Yale swallow screening and that family states patient has a chronic dysphagia.      SLP Plan  Continue with current plan of care      Recommendations for follow up therapy are one component of a multi-disciplinary discharge planning process, led by the attending physician.  Recommendations may be updated based on patient status, additional functional criteria and insurance authorization.    Recommendations  Diet recommendations: Dysphagia 3 (mechanical soft);Thin liquid Liquids provided via: Cup;Straw Medication Administration: Whole meds  with puree Supervision: Patient able to self feed Compensations: Slow rate;Small sips/bites;Follow  solids with liquid;Multiple dry swallows after each bite/sip Postural Changes and/or Swallow Maneuvers: Seated upright 90 degrees;Upright 30-60 min after meal                  Oral care BID;Other (Comment) (oral suction after meals)     Dysphagia, oropharyngeal phase (R13.12)     Continue with current plan of care    Rolena Infante, MS [facility] SLP Acute Rehab Services Office [phone]  Chales Abrahams  07/29/2023, 1:48 PM

## 2023-07-29 NOTE — Progress Notes (Signed)
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  She presented back from the facility due to reported confusion.  Patient has no history of dementia.  Patient was reportedly diagnosed with COVID yesterday at the facility.  UA also positive for leukocyte Estrace many bacteria. Ammonia level is less than 10. CT head negative for acute abnormality.  Patient blood sugar was also found to be low 62.  Patient is admitted for further evaluation.  Assessment & Plan:   Principal Problem:   COVID-19 virus infection Active Problems:   Acute metabolic encephalopathy   Edema due to hypoalbuminemia   Diabetic hypoglycemia (HCC)   Catheter-associated urinary tract infection (HCC)   Essential hypertension   Hypothyroidism   Morbid obesity (HCC)   Type 2 diabetes mellitus (HCC)   History of pulmonary embolism   Obstructive sleep apnea   Chronic kidney disease, stage 3b (HCC)   Chronic constipation  COVID-19 infection: Patient does not appear hypoxic. Denies any fever. Continue supportive care. No need for paxloid.  Catheter-associated urinary tract infection (HCC) UA consistent with UTI. Initiated on IV Rocephin. Urine culture grew 100 K of Enterobacter. Antibiotics changed to cefepime.   Diabetic hypoglycemia [facility]) May be one of the etiologies of her acute metabolic encephalopathy.  Has required 2 doses of 25 g of IV D50.  Continue low dose D10 gtts. Hold DM meds.   Edema due to hypoalbuminemia: Pt with low total protein and albumin.  If needing to diurese, would add bumex or demadex.   Can also give IV albumin along with IV lasix.   Acute metabolic encephalopathy: Probably due to covid,  hypoglycemia, UTI. CT head negative for acute abnormality. She seems improved and back to baseline   Chronic constipation Continue senna and miralax.  Hx of hemorrhoids with rectal bleeding in the past when she gets constipated.   Chronic kidney disease, stage 3b (HCC): Chronic. Baseline Scr 1.0. Avoid nephrotoxic medications.  Continue hydration   Obstructive sleep apnea: Continue CPAP prn   History of pulmonary embolism: On DVT prophylaxis with 40 mg SQ lovenox.  Per last discharge summary, pt intolerant to restarting Eliquis due to recurrent hematuria.   Type 2 diabetes mellitus (HCC): Hold DM meds due to hypoglycemia.  Hypothyroidism: Stable. Continue synthroid 50 mcg. Check FT4   Essential hypertension: Continue Coreg 6.25 mg qday.  Sinus bradycardia: Decreased Coreg to 3.125 mg every 12 hours. Now HR increased, dose increased to 6.25 bid   Obesity: Diet and exercise discussed in detail Estimated body mass index is 36.12 kg/m as calculated from the following:   Height as of this encounter: 5\' 1"  (1.549 m).   Weight as of this encounter: 86.7 kg.    DVT prophylaxis: Lovenox Code Status: Full code Family Communication: No family at bed side Disposition Plan:     Status is: Inpatient Remains inpatient appropriate because: Admitted for acute metabolic encephalopathy likely due to UTI , now requiring IV antibiotics. Patient also COVID-positive requiring supportive care.   Consultants:  None  Procedures: None  Antimicrobials:  Anti-infectives (From admission, onward)  Start     Dose/Rate Route Frequency Ordered Stop   07/27/23 1700  ceFEPIme (MAXIPIME) 2 g in sodium chloride 0.9 % 100 mL IVPB        2 g 200 mL/hr over 30 Minutes Intravenous Every 12 hours 07/27/23 1523     07/25/23 2300   cefTRIAXone (ROCEPHIN) 1 g in sodium chloride 0.9 % 100 mL IVPB  Status:  Discontinued        1 g 200 mL/hr over 30 Minutes Intravenous Every 24 hours 07/25/23 0430 07/27/23 1523   07/24/23 2145  cefTRIAXone (ROCEPHIN) 1 g in sodium chloride 0.9 % 100 mL IVPB        1 g 200 mL/hr over 30 Minutes Intravenous  Once 07/24/23 2143 07/24/23 2330      Subjective: Patient was seen and examined at bedside.  Overnight events noted. Seems much improved.  She is back to her baseline mental status. Family at bedside,  states she does have intermittent episodes of disorientation.   Objective: Vitals:   07/28/23 1226 07/28/23 2052 07/29/23 0500 07/29/23 1221  BP: 128/83 102/71  (!) 136/90  Pulse: 83 (!) 104  (!) 105  Resp: 16 18  19   Temp: 97.7 F (36.5 C) 98.5 F (36.9 C)  98.2 F (36.8 C)  TempSrc: Oral Oral  Oral  SpO2: 98% 98%  98%  Weight:   86.7 kg   Height:        Intake/Output Summary (Last 24 hours) at 07/29/2023 1521 Last data filed at 07/29/2023 1257 Gross per 24 hour  Intake 1689.86 ml  Output 1225 ml  Net 464.86 ml   Filed Weights   07/27/23 0434 07/28/23 0500 07/29/23 0500  Weight: 88.7 kg 90.7 kg 86.7 kg    Examination:  General exam: Appears comfortable, deconditioned, not in any acute distress. Respiratory system: CTA bilaterally . Respiratory effort normal. RR 14 Cardiovascular system: S1 & S2 heard, RRR. No JVD, murmurs, rubs, gallops or clicks. Gastrointestinal system: Abdomen is non distended, soft and non tender. Normal bowel sounds heard. Central nervous system: Alert and oriented X 3. No focal neurological deficits. Extremities: Edema+, no cyanosis, no clubbing Skin: No rashes, lesions or ulcers Psychiatry: Judgement and insight appear normal. Mood & affect appropriate.     Data Reviewed: I have personally reviewed following labs and imaging studies  CBC: Recent Labs  Lab 07/24/23 2047 07/26/23 1200 07/27/23 0613  WBC 6.3 3.2* 2.6*  HGB 9.1* 9.0*  8.6*  HCT 28.4* 28.9* 26.8*  MCV 96.6 97.6 93.1  PLT 264 242 240   Basic Metabolic Panel: Recent Labs  Lab 07/24/23 2047 07/26/23 1200 07/27/23 0613  NA 138 134* 134*  K 4.2 3.7 3.4*  CL 108 106 106  CO2 23 21* 20*  GLUCOSE 67* 130* 104*  BUN 23 18 16   CREATININE 0.82 0.87 0.93  CALCIUM 8.2* 7.6* 7.3*  MG  --  1.5* 1.8  PHOS  --  2.3* 2.8   GFR: Estimated Creatinine Clearance: 50.7 mL/min (by C-G formula based on SCr of 0.93 mg/dL). Liver Function Tests: Recent Labs  Lab 07/24/23 2047 07/26/23 1200  AST 128* 69*  ALT 49* 32  ALKPHOS 39 34*  BILITOT 0.6 0.5  PROT 4.9* 4.4*  ALBUMIN 2.0* 1.8*   No results for input(s): "LIPASE", "AMYLASE" in the last 168 hours. Recent Labs  Lab 07/24/23 2150  AMMONIA <10   Coagulation Profile: No results for input(s): "INR", "PROTIME" in the last 168 hours. Cardiac Enzymes:  No results for input(s): "CKTOTAL", "CKMB", "CKMBINDEX", "TROPONINI" in the last 168 hours. BNP (last 3 results) No results for input(s): "PROBNP" in the last 8760 hours. HbA1C: No results for input(s): "HGBA1C" in the last 72 hours. CBG: Recent Labs  Lab 07/28/23 1226 07/28/23 1749 07/28/23 2301 07/29/23 0632 07/29/23 1217  GLUCAP 115* 91 98 105* 100*   Lipid Profile: No results for input(s): "CHOL", "HDL", "LDLCALC", "TRIG", "CHOLHDL", "LDLDIRECT" in the last 72 hours. Thyroid Function Tests: No results for input(s): "TSH", "T4TOTAL", "FREET4", "T3FREE", "THYROIDAB" in the last 72 hours.  Anemia Panel: No results for input(s): "VITAMINB12", "FOLATE", "FERRITIN", "TIBC", "IRON", "RETICCTPCT" in the last 72 hours. Sepsis Labs: No results for input(s): "PROCALCITON", "LATICACIDVEN" in the last 168 hours.  Recent Results (from the past 240 hour(s))  Urine Culture     Status: Abnormal   Collection Time: 07/24/23  8:29 PM   Specimen: Urine, Clean Catch  Result Value Ref Range Status   Specimen Description   Final    URINE, CLEAN  CATCH Performed at [facility], [address], [place], [place] [address]    Special Requests   Final    NONE Performed at [facility], [address] [place], [place] [address]    Culture >=100,000 COLONIES/mL ENTEROBACTER CLOACAE (A)  Final   Report Status 07/27/2023 FINAL  Final   Organism ID, Bacteria ENTEROBACTER CLOACAE (A)  Final      Susceptibility   Enterobacter cloacae - MIC*    CEFEPIME <=0.12 SENSITIVE Sensitive     CIPROFLOXACIN <=0.25 SENSITIVE Sensitive     GENTAMICIN <=1 SENSITIVE Sensitive     IMIPENEM <=0.25 SENSITIVE Sensitive     NITROFURANTOIN 32 SENSITIVE Sensitive     TRIMETH/SULFA <=20 SENSITIVE Sensitive     PIP/TAZO 8 SENSITIVE Sensitive     * >=100,000 COLONIES/mL ENTEROBACTER CLOACAE  SARS Coronavirus 2 by RT PCR (hospital order, performed in [facility] hospital lab) *cepheid single result test* Anterior Nasal Swab     Status: Abnormal   Collection Time: 07/24/23  9:50 PM   Specimen: Anterior Nasal Swab  Result Value Ref Range Status   SARS Coronavirus 2 by RT PCR POSITIVE (A) NEGATIVE Final    Comment: (NOTE) SARS-CoV-2 target nucleic acids are DETECTED  SARS-CoV-2 RNA is generally detectable in upper respiratory specimens  during the acute phase of infection.  Positive results are indicative  of the presence of the identified virus, but do not rule out bacterial infection or co-infection with other pathogens not detected by the test.  Clinical correlation with patient history and  other diagnostic information is necessary to determine patient infection status.  The expected result is negative.  Fact Sheet for Patients:   RoadLapTop.co.za   Fact Sheet for Healthcare Providers:   http://kim-miller.com/    This test is not yet approved or cleared by the [place] FDA and  has been authorized for detection and/or diagnosis of SARS-CoV-2 by FDA under an  Emergency Use Authorization (EUA).  This EUA will remain in effect (meaning this test can be used) for the duration of  the COVID-19 declaration under Section 564(b)(1)  of the Act, 21 U.S.C. section 360-bbb-3(b)(1), unless the authorization is terminated or revoked sooner.   Performed at [facility], [address], [place], [place] [address]    Radiology Studies: No results found.  Scheduled Meds:  [START ON 07/30/2023] carvedilol  6.25 mg Oral Daily   Chlorhexidine Gluconate Cloth  6  each Topical Daily   enoxaparin (LOVENOX) injection  40 mg Subcutaneous Q24H   levothyroxine  50 mcg Oral QAC breakfast   pantoprazole  40 mg Oral Daily   polyethylene glycol  17 g Oral Daily   senna-docusate  2 tablet Oral Daily   Continuous Infusions:  ceFEPime (MAXIPIME) IV Stopped (07/29/23 0551)   dextrose 10 % 1,000 mL with potassium chloride 20 mEq infusion 50 mL/hr at 07/29/23 1424     LOS: 4 days    Time spent: 35 mins    Willeen Niece, MD [facility]   If 7PM-7AM, please contact night-coverage

## 2023-07-30 DIAGNOSIS — U071 COVID-19: Secondary | ICD-10-CM | POA: Diagnosis not present

## 2023-07-30 LAB — BASIC METABOLIC PANEL
Anion gap: 6 (ref 5–15)
BUN: 14 mg/dL (ref 8–23)
CO2: 20 mmol/L — ABNORMAL LOW (ref 22–32)
Calcium: 6.9 mg/dL — ABNORMAL LOW (ref 8.9–10.3)
Chloride: 102 mmol/L (ref 98–111)
Creatinine, Ser: 0.92 mg/dL (ref 0.44–1.00)
GFR, Estimated: >60 mL/min (ref >60)
Glucose, Bld: 90 mg/dL (ref 70–99)
Potassium: 4.4 mmol/L (ref 3.5–5.1)
Sodium: 128 mmol/L — ABNORMAL LOW (ref 135–145)

## 2023-07-30 LAB — CBC
HCT: 28.8 % — ABNORMAL LOW (ref 36.0–46.0)
Hemoglobin: 9.2 g/dL — ABNORMAL LOW (ref 12.0–15.0)
MCH: 30.7 pg (ref 26.0–34.0)
MCHC: 31.9 g/dL (ref 30.0–36.0)
MCV: 96 fL (ref 80.0–100.0)
Platelets: 238 10*3/uL (ref 150–400)
RBC: 3 MIL/uL — ABNORMAL LOW (ref 3.87–5.11)
RDW: 16.2 % — ABNORMAL HIGH (ref 11.5–15.5)
WBC: 3.4 10*3/uL — ABNORMAL LOW (ref 4.0–10.5)
nRBC: 0 % (ref 0.0–0.2)

## 2023-07-30 LAB — GLUCOSE, CAPILLARY
Glucose-Capillary: 74 mg/dL (ref 70–99)
Glucose-Capillary: 76 mg/dL (ref 70–99)
Glucose-Capillary: 79 mg/dL (ref 70–99)
Glucose-Capillary: 95 mg/dL (ref 70–99)

## 2023-07-30 LAB — PHOSPHORUS: Phosphorus: 2.3 mg/dL — ABNORMAL LOW (ref 2.5–4.6)

## 2023-07-30 LAB — MAGNESIUM: Magnesium: 1.9 mg/dL (ref 1.7–2.4)

## 2023-07-30 MED ORDER — SODIUM PHOSPHATES 45 MMOLE/15ML IV SOLN
30.0000 mmol | Freq: Once | INTRAVENOUS | Status: AC
  Administered 2023-07-30: 30 mmol via INTRAVENOUS
  Filled 2023-07-30: qty 10

## 2023-07-30 MED ORDER — POTASSIUM PHOSPHATES 15 MMOLE/5ML IV SOLN
30.0000 mmol | Freq: Once | INTRAVENOUS | Status: DC
  Filled 2023-07-30: qty 10

## 2023-07-30 MED ORDER — AMIODARONE IV BOLUS ONLY 150 MG/100ML
150.0000 mg | INTRAVENOUS | Status: DC | PRN
  Administered 2023-07-30: 150 mg via INTRAVENOUS
  Filled 2023-07-30: qty 100

## 2023-07-30 MED ORDER — METOPROLOL TARTRATE 5 MG/5ML IV SOLN: 2.5000 mg | INTRAVENOUS | Status: DC | PRN

## 2023-07-30 MED ORDER — MAGNESIUM SULFATE 2 GM/50ML IV SOLN
2.0000 g | Freq: Once | INTRAVENOUS | Status: AC
  Administered 2023-07-30: 2 g via INTRAVENOUS
  Filled 2023-07-30: qty 50

## 2023-07-30 NOTE — Progress Notes (Signed)
Pt showed Vfib on telemetry monitor and therefore Rapid Response was called and MD paged. Went to bedside and performed EKG along with VS. Rapid response arrived within 3 mins and MD at the same time. Followed MD orders to stabilize pt. Pt stable at this time. No s/s of distress noted. VS WNL

## 2023-07-30 NOTE — Progress Notes (Addendum)
PROGRESS NOTE    Tricia Harrison  ZOX:[id number] DOB: 1945/12/13 DOA: 07/24/2023 PCP: Darrow Bussing, MD   Brief Narrative:  This 77 years old female with PMH significant for morbid obesity, type 2 diabetes, history of PE, CKD stage IIIb with baseline creatinine 1.0, hypertension, recent chronic Foley catheter placement due to urinary retention, chronic edema due to hypoalbuminemia, chronic anemia from chronic kidney disease, chronic back pain who was discharged to [facility] nursing home on 07/16/2023.  She presented back from the facility due to reported confusion.  Patient has no history of dementia.  Patient was reportedly diagnosed with COVID yesterday at the facility.  UA also positive for leukocyte Estrace many bacteria. Ammonia level is less than 10. CT head negative for acute abnormality.  Patient blood sugar was also found to be low 62.  Patient is admitted for further evaluation.  Assessment & Plan:   Principal Problem:   COVID-19 virus infection Active Problems:   Acute metabolic encephalopathy   Edema due to hypoalbuminemia   Diabetic hypoglycemia (HCC)   Catheter-associated urinary tract infection (HCC)   Essential hypertension   Hypothyroidism   Morbid obesity (HCC)   Type 2 diabetes mellitus (HCC)   History of pulmonary embolism   Obstructive sleep apnea   Chronic kidney disease, stage 3b (HCC)   Chronic constipation  **RAPID RESPONSE** Transient episode of torsades/tachycardia, patient stable without hypotension.  Unfortunately review of systems/mental status remains poor at baseline making symptoms difficult to follow.  Initiate amiodarone bolus with magnesium and sodium phosphate to correct electrolyte abnormalities.  Continue telemetry follow closely.  Family made aware.  Failure to thrive, goals of care -Lengthy discussion with son who is power of attorney as well as daughter-in-law and sister about goals of care -Son indicates his mother, the patient, would  not want to be resuscitated, as such she has been made DNR as below. -Conversation about possible transition to palliative care versus hospice should patient continue to decline was reasonable, will attempt to improve patient's p.o. and caloric intake over the next 48 hours, if by the weekend patient continues to have poor mental status, poor caloric intake and continues to decline would likely transition to palliative care/hospice in the interim. -Certainly if patient were to improve in regards to mental status and p.o. intake discharged back to rehab facility versus home would be reasonable.  COVID-19 infection: Without fever, hypoxia, no indication for ongoing treatment  Catheter-associated urinary tract infection (HCC), resolved UA consistent with UTI. Initiated on IV Rocephin. Urine culture grew 100 K of Enterobacter. Antibiotics changed to cefepime -last dose 07/30/2023   Diabetic hypoglycemia (HCC) Possibly exacerbating encephalopathy, although mental status not improving despite correction of hypoglycemia Continue D10 until p.o. intake is more appropriate   Failure to thrive/moderate to severe protein caloric malnutrition  Concurrent edema due to hypoalbuminemia: Pt with low total protein and albumin.  Continue to advance diet as tolerated, currently patient is refusing any further p.o. intake   Acute metabolic encephalopathy: -Likely multifactorial in the setting of covid,  hypoglycemia, UTI. -CT head negative for acute abnormality. -Would presume ongoing improvement given resolution of hypoglycemia, completion of antibiotics for UTI and ongoing improvement in regards to COVID infection.   Chronic constipation -Continue senna and miralax.  -Hx of hemorrhoids with rectal bleeding in the past when she gets constipated.   Chronic kidney disease, stage 3b (HCC): -Chronic. Baseline Scr 1.0. -Avoid nephrotoxic medications.  Continue hydration   Obstructive sleep apnea: Continue  CPAP prn  History of pulmonary embolism: On DVT prophylaxis with 40 mg SQ lovenox.  Per last discharge summary, pt intolerant to restarting Eliquis due to recurrent hematuria.   Type 2 diabetes mellitus (HCC): Hold DM meds due to hypoglycemia.  Hypothyroidism: Stable. Continue synthroid 50 mcg   Essential hypertension: Sinus bradycardia, stable: Continue Coreg 6.25 mg qday.  Obesity: Diet and exercise discussed in detail Estimated body mass index is 36.24 kg/m as calculated from the following:   Height as of this encounter: 5\' 1"  (1.549 m).   Weight as of this encounter: 87 kg.    DVT prophylaxis: enoxaparin (LOVENOX) injection 40 mg Start: 07/25/23 1000 SCDs Start: 07/25/23 0431 Place TED hose Start: 07/25/23 0431 Code Status:   Code Status: Limited: Do not attempt resuscitation (DNR) -DNR-LIMITED -Do Not Intubate/DNI  Family Communication: Son daughter-in-law and sister updated Disposition Plan: Inpatient Remains inpatient appropriate because: Admitted for acute metabolic encephalopathy likely due to UTI , now requiring IV antibiotics. Patient also COVID-positive requiring supportive care.   Consultants:  None  Procedures: None  Antimicrobials:  Anti-infectives (From admission, onward)    Start     Dose/Rate Route Frequency Ordered Stop   07/27/23 1700  ceFEPIme (MAXIPIME) 2 g in sodium chloride 0.9 % 100 mL IVPB        2 g 200 mL/hr over 30 Minutes Intravenous Every 12 hours 07/27/23 1523     07/25/23 2300  cefTRIAXone (ROCEPHIN) 1 g in sodium chloride 0.9 % 100 mL IVPB  Status:  Discontinued        1 g 200 mL/hr over 30 Minutes Intravenous Every 24 hours 07/25/23 0430 07/27/23 1523   07/24/23 2145  cefTRIAXone (ROCEPHIN) 1 g in sodium chloride 0.9 % 100 mL IVPB        1 g 200 mL/hr over 30 Minutes Intravenous  Once 07/24/23 2143 07/24/23 2330      Subjective: No acute issues/events reported overnight - patient continues to be minimally responsive -  non-cooperative with exam or interview   Objective: Vitals:   07/30/23 0500 07/30/23 0612 07/30/23 1036 07/30/23 1157  BP:  108/71 (!) 144/92 (!) 134/110  Pulse:  73 86 89  Resp:  19 14 18   Temp:  99.1 F (37.3 C) 98.9 F (37.2 C)   TempSrc:  Oral    SpO2:  99% 94% 100%  Weight: 87 kg     Height:        Intake/Output Summary (Last 24 hours) at 07/30/2023 1426 Last data filed at 07/30/2023 1230 Gross per 24 hour  Intake 1172.95 ml  Output --  Net 1172.95 ml   Filed Weights   07/28/23 0500 07/29/23 0500 07/30/23 0500  Weight: 90.7 kg 86.7 kg 87 kg    Examination:  General:  Pleasantly resting in bed, No acute distress.  Poorly interactive, will not follow commands HEENT: Blinks to threat, will not follow or track movement Neck:  Without mass or deformity.  Trachea is midline. Lungs:  Clear to auscultate bilaterally without rhonchi, wheeze, or rales. Heart:  Regular rate and rhythm.  Without murmurs, rubs, or gallops. Abdomen:  Soft, nontender, nondistended.  Without guarding or rebound. Extremities: Without cyanosis, clubbing, edema, or obvious deformity. Skin:  Warm and dry, no erythema.   Data Reviewed: I have personally reviewed following labs and imaging studies  CBC: Recent Labs  Lab 07/24/23 2047 07/26/23 1200 07/27/23 0613 07/30/23 0535  WBC 6.3 3.2* 2.6* 3.4*  HGB 9.1* 9.0* 8.6* 9.2*  HCT 28.4* 28.9*  26.8* 28.8*  MCV 96.6 97.6 93.1 96.0  PLT 264 242 240 238   Basic Metabolic Panel: Recent Labs  Lab 07/24/23 2047 07/26/23 1200 07/27/23 0613 07/30/23 0535  NA 138 134* 134* 128*  K 4.2 3.7 3.4* 4.4  CL 108 106 106 102  CO2 23 21* 20* 20*  GLUCOSE 67* 130* 104* 90  BUN 23 18 16 14   CREATININE 0.82 0.87 0.93 0.92  CALCIUM 8.2* 7.6* 7.3* 6.9*  MG  --  1.5* 1.8 1.9  PHOS  --  2.3* 2.8 2.3*   GFR: Estimated Creatinine Clearance: 51.3 mL/min (by C-G formula based on SCr of 0.92 mg/dL). Liver Function Tests: Recent Labs  Lab 07/24/23 2047  07/26/23 1200  AST 128* 69*  ALT 49* 32  ALKPHOS 39 34*  BILITOT 0.6 0.5  PROT 4.9* 4.4*  ALBUMIN 2.0* 1.8*   Radiology Studies: CT HEAD WO CONTRAST ( )  Result Date: 07/29/2023 CLINICAL DATA:  Initial evaluation for new onset seizure. EXAM: CT HEAD WITHOUT CONTRAST TECHNIQUE: Contiguous axial images were obtained from the base of the skull through the vertex without intravenous contrast. RADIATION DOSE REDUCTION: This exam was performed according to the departmental dose-optimization program which includes automated exposure control, adjustment of the mA and/or kV according to patient size and/or use of iterative reconstruction technique. COMPARISON:  Prior CT from 07/24/2023. FINDINGS: Brain: Mild age-related cerebral atrophy with chronic small vessel ischemic disease. Remote lacunar infarcts noted about the bilateral basal ganglia, stable. No acute intracranial hemorrhage. No acute large vessel territory infarct. No mass lesion or midline shift. No hydrocephalus or extra-axial fluid collection. Vascular: No abnormal hyperdense vessel. Scattered vascular calcifications noted within the carotid siphons. Skull: Scalp soft tissues demonstrate no acute finding. Calvarium intact. Biparietal foramina noted. Sinuses/Orbits: Globes orbital soft tissues within normal limits. Scattered layering secretions and mucosal thickening noted about the sphenoid ethmoidal and maxillary sinuses. No mastoid effusion. Other: None. IMPRESSION: 1. No acute intracranial abnormality. 2. 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 3. Scattered layering secretions and mucosal thickening about the sphenoid ethmoidal and maxillary sinuses. Clinical correlation for acute sinusitis recommended. Electronically Signed   By: [name] M.D.   On: 07/29/2023 22:50    Scheduled Meds:  carvedilol  6.25 mg Oral Daily    Chlorhexidine Gluconate Cloth  6 each Topical Daily   enoxaparin (LOVENOX) injection  40 mg Subcutaneous Q24H   levothyroxine  50 mcg Oral QAC breakfast   pantoprazole  40 mg Oral Daily   polyethylene glycol  17 g Oral Daily   senna-docusate  2 tablet Oral Daily   Continuous Infusions:  ceFEPime (MAXIPIME) IV 2 g (07/30/23 0459)   dextrose 10 % 1,000 mL with potassium chloride 20 mEq infusion 50 mL/hr at 07/29/23 2010    LOS: 5 days   Time spent: 35 mins  Azucena Fallen, DO [facility]   If 7PM-7AM, please contact night-coverage

## 2023-07-30 NOTE — Significant Event (Signed)
Rapid Response Event Note   Reason for Call :  V-tach   Initial Focused Assessment:  Upon reviewing Tele, patient sustained V-tach/ Torsades from 15:29-15:34, now in A-fib rate of 118. Patient's mental status has been declining today. No oral intake. Patient unable to follow commands. Patient able to track with eyes. An hour prior patient made DNR and it was stated that conservative measures should be taken. MD Natale Milch at bedside.   Interventions:  Amiodarone 150mg  IV now  2g magnesium IV  Sodium phosphate IV  Plan of Care:  Continue conservative measures, if patient sustains V-tach again please reach out to physician and Rapid response.   Event Summary:  Patient to remain on Tele   MD Notified: Carma Leaven   Call Time: 15:30  Arrival Time: 15:35 End Time: 16:30   Teresita Madura, RN

## 2023-07-30 NOTE — Plan of Care (Signed)
Pt not progressing during this shift.

## 2023-07-30 NOTE — Plan of Care (Signed)
  Problem: Health Behavior/Discharge Planning: Goal: Ability to manage health-related needs will improve Outcome: Not Progressing   Problem: Clinical Measurements: Goal: Ability to maintain clinical measurements within normal limits will improve Outcome: Not Progressing Goal: Will remain free from infection Outcome: Not Progressing Goal: Diagnostic test results will improve Outcome: Not Progressing Goal: Respiratory complications will improve Outcome: Not Progressing Goal: Cardiovascular complication will be avoided Outcome: Not Progressing   Problem: Nutrition: Goal: Adequate nutrition will be maintained Outcome: Not Progressing   Problem: Elimination: Goal: Will not experience complications related to bowel motility Outcome: Not Progressing Goal: Will not experience complications related to urinary retention Outcome: Not Progressing   Problem: Skin Integrity: Goal: Risk for impaired skin integrity will decrease Outcome: Not Progressing   Problem: Safety: Goal: Ability to remain free from injury will improve Outcome: Not Progressing

## 2023-07-30 NOTE — Progress Notes (Signed)
Arrival of shift family at bedside concerned for Tricia Harrison decreased responsiveness. Dayshift Nurse gave report and their concern, stated  " pt was more talkative this morning but has slowly decreased in responding to family as the day went on and family was in the room with Dr. Nadeen Landau On -call NP J. Garner Nash at bedside when this RN went into pt room. Pt is not as responsive as she was when I had her over the weekend but did answer me when I asked " How are you Tricia Harrison" pt replied," I'm doing ok".   Pt went for a CT. Please see notes.  Tylenol was ordered/ administered.

## 2023-07-30 NOTE — TOC Initial Note (Signed)
Transition of Care [facility]) - Initial/Assessment Note    Patient Details  Name: Tricia Harrison MRN: [id number] Date of Birth: 1946-02-06  Transition of Care [facility]) CM/SW Contact:    Darleene Cleaver, LCSW Phone Number: 07/30/2023, 6:04 PM  Clinical Narrative:                  Patient is a 77 year old female who is not alert and oriented.  Patient is from  [facility], patient was receiving short term rehab there.  Per [employer], patient was potentially planning to return under rehab, then transition to LTC.  Patient is confused at this time.  Per physician, patient is Covid +, and is being treated.  Patient plans to return back to facility once medically clear.  Patient is being followed by palliative team at hospital as well.TOC to continue to follow patient's progress throughout discharge planning.  Expected Discharge Plan: Skilled Nursing Facility Barriers to Discharge: Continued Medical Work up   Patient Goals and CMS Choice Patient states their goals for this hospitalization and ongoing recovery are:: To return to SNF. CMS Medicare.gov Compare Post Acute Care list provided to:: Other (Comment Required) (Chart Review)   [place] ownership interest in [facility].provided to:: Patient    Expected Discharge Plan and Services     Post Acute Care Choice: Skilled Nursing Facility Living arrangements for the past 2 months: Skilled Nursing Facility                                      Prior Living Arrangements/Services Living arrangements for the past 2 months: Skilled Nursing Facility Lives with:: Facility Resident Patient language and need for interpreter reviewed:: Yes Do you feel safe going back to the place where you live?: Yes      Need for Family Participation in Patient Care: Yes (Comment) Care giver support system in place?: Yes (comment)   Criminal Activity/Legal Involvement Pertinent to Current Situation/Hospitalization: No - Comment  as needed  Activities of Daily Living Home Assistive Devices/Equipment: Wheelchair, Environmental consultant (specify type) ADL Screening (condition at time of admission) Patient's cognitive ability adequate to safely complete daily activities?: No Is the patient deaf or have difficulty hearing?: No Does the patient have difficulty seeing, even when wearing glasses/contacts?: No Does the patient have difficulty concentrating, remembering, or making decisions?: Yes Patient able to express need for assistance with ADLs?: Yes Does the patient have difficulty dressing or bathing?: Yes Independently performs ADLs?: No Communication: Needs assistance Is this a change from baseline?: Pre-admission baseline Dressing (OT): Dependent Is this a change from baseline?: Pre-admission baseline Grooming: Dependent Is this a change from baseline?: Pre-admission baseline Feeding: Dependent Is this a change from baseline?: Pre-admission baseline Bathing: Dependent Is this a change from baseline?: Pre-admission baseline Toileting: Dependent Is this a change from baseline?: Pre-admission baseline In/Out Bed: Dependent Is this a change from baseline?: Pre-admission baseline Walks in Home: Dependent Is this a change from baseline?: Pre-admission baseline Does the patient have difficulty walking or climbing stairs?: Yes Weakness of Legs: Both Weakness of Arms/Hands: Both  Permission Sought/Granted Permission sought to share information with : Facility Medical sales representative, Family Supports, Case Manager Permission granted to share information with : Yes, Release of Information Signed  Share Information with NAME: Elane Fritz [phone]    Stregles,(DIL)Rosyln Daughter   [phone]  Verdia Kuba [phone]  [phone]  Permission granted to share  info w AGENCY: SNF admissions        Emotional Assessment Appearance:: Appears stated age Attitude/Demeanor/Rapport: Unable to Assess Affect  (typically observed): Unable to Assess, Appropriate Orientation: : Oriented to Self Alcohol / Substance Use: Not Applicable Psych Involvement: No (comment)  Admission diagnosis:  Metabolic encephalopathy [G93.41] Hypoglycemia [E16.2] COVID-19 virus infection [U07.1] Patient Active Problem List   Diagnosis Date Noted   Edema due to hypoalbuminemia 07/25/2023   Diabetic hypoglycemia (HCC) 07/25/2023   Catheter-associated urinary tract infection (HCC) 07/25/2023   COVID-19 virus infection 07/24/2023   Acute metabolic encephalopathy 07/24/2023   Abnormal CT scan, colon 07/09/2023   Hemorrhoid 07/04/2023   Scoliosis 07/04/2023   Grade I diastolic dysfunction 07/04/2023   S/P reverse total shoulder arthroplasty, 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   Anemia 06/16/2023   Chronic constipation 02/04/2022   Uses wheelchair 01/14/2022   Velopharyngeal dysfunction with mild swallowing difficulty 01/14/2022   Uses roller walker 01/11/2022   Atherosclerosis of aorta (HCC) 01/11/2022   Chronic kidney disease, stage 3b (HCC) 01/11/2022   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   Orthostasis 01/11/2022   Chronic cholecystitis 01/10/2022   Obstructive sleep apnea 03/09/2019   Aortic regurgitation 12/02/2018   History of pulmonary embolism 08/20/2018   DOE (dyspnea on exertion)    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   Bilateral ovarian cysts    Hernia, hiatal    Renal cysts, acquired, bilateral    GERD (gastroesophageal reflux disease)    Diverticulosis    Lumbar spinal stenosis    Type 2 diabetes mellitus (HCC)    Left knee DJD    Rapid palpitations 12/06/2013   Morbid obesity (HCC) 12/06/2013   Fatigue 12/06/2013   PVC (premature ventricular contraction) 12/06/2013   Essential hypertension    Hypothyroidism    Hyperlipidemia LDL goal <100    PCP:  Darrow Bussing, MD Pharmacy:   [id number] - [place], [place] - [address] [place] [address] Phone:  [phone] Fax: [phone]  [facility]) - [place], [place] - [address] [facility] [address] [place] [address] [place] [address] Phone: [phone] Fax: [phone]  [facility] Delivery - [place], [place] - [address] [place] [address] Phone: [phone] Fax: [phone]     Social Determinants of Health (SDOH) Social History: SDOH Screenings   Food Insecurity: No Food Insecurity (07/05/2023)  Housing: Patient Unable To Answer (07/05/2023)  Transportation Needs: No Transportation Needs (07/26/2023)  Utilities: Not At Risk (07/26/2023)  Depression (PHQ2-9): Low Risk  (11/09/2018)  Tobacco Use: Medium Risk (07/24/2023)   SDOH Interventions:     Readmission Risk Interventions    07/08/2023    4:14 PM  Readmission Risk Prevention Plan  Transportation Screening Complete  PCP or Specialist Appt within 5-7 Days Complete  Home Care Screening Complete  Medication Review (RN CM) Complete

## 2023-07-31 DIAGNOSIS — U071 COVID-19: Secondary | ICD-10-CM | POA: Diagnosis not present

## 2023-07-31 LAB — GLUCOSE, CAPILLARY
Glucose-Capillary: 87 mg/dL (ref 70–99)
Glucose-Capillary: 81 mg/dL (ref 70–99)
Glucose-Capillary: 90 mg/dL (ref 70–99)

## 2023-07-31 MED ORDER — MEGESTROL ACETATE 400 MG/10ML PO SUSP
400.0000 mg | Freq: Two times a day (BID) | ORAL | Status: DC
  Administered 2023-07-31 – 2023-08-06 (×12): 400 mg via ORAL
  Filled 2023-07-31 (×12): qty 10

## 2023-07-31 NOTE — Plan of Care (Signed)
  Problem: Clinical Measurements: Goal: Ability to maintain clinical measurements within normal limits will improve Outcome: Progressing Goal: Will remain free from infection Outcome: Progressing Goal: Diagnostic test results will improve Outcome: Progressing Goal: Respiratory complications will improve Outcome: Progressing Goal: Cardiovascular complication will be avoided Outcome: Progressing   Problem: Elimination: Goal: Will not experience complications related to bowel motility Outcome: Progressing Goal: Will not experience complications related to urinary retention Outcome: Progressing   Problem: Pain Managment: Goal: General experience of comfort will improve Outcome: Progressing   Problem: Safety: Goal: Ability to remain free from injury will improve Outcome: Progressing   Problem: Skin Integrity: Goal: Risk for impaired skin integrity will decrease Outcome: Progressing   Problem: Coping: Goal: Psychosocial and spiritual needs will be supported Outcome: Progressing   Problem: Respiratory: Goal: Complications related to the disease process, condition or treatment will be avoided or minimized Outcome: Progressing

## 2023-07-31 NOTE — Progress Notes (Signed)
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  She presented back from the facility due to reported confusion.  Patient has no history of dementia.  Patient was reportedly diagnosed with COVID yesterday at the facility.  UA also positive for leukocyte Estrace many bacteria. Ammonia level is less than 10. CT head negative for acute abnormality.  Patient blood sugar was also found to be low 62.  Patient is admitted for further evaluation.  Assessment & Plan:   Principal Problem:   COVID-19 virus infection Active Problems:   Acute metabolic encephalopathy   Edema due to hypoalbuminemia   Diabetic hypoglycemia (HCC)   Catheter-associated urinary tract infection (HCC)   Essential hypertension   Hypothyroidism   Morbid obesity (HCC)   Type 2 diabetes mellitus (HCC)   History of pulmonary embolism   Obstructive sleep apnea   Chronic kidney disease, stage 3b (HCC)   Chronic constipation  Failure to thrive, goals of care -Lengthy discussion with son who is power of attorney as well as daughter-in-law and sister about goals of care -Son indicates his mother, the patient, would not want to be resuscitated, as such she has been made DNR as below. -Conversation about possible transition to palliative care versus hospice should patient continue to decline was reasonable, will attempt to improve patient's p.o. and caloric intake over the next 48 hours, if by the weekend patient continues to have poor mental status, poor caloric intake and  continues to decline would likely transition to palliative care/hospice in the interim. -Certainly if patient were to improve in regards to mental status and p.o. intake discharged back to rehab facility versus home would be reasonable.  COVID-19 infection: Without fever, hypoxia, no indication for ongoing treatment  Catheter-associated urinary tract infection (HCC), resolved UA consistent with UTI. Initiated on IV Rocephin. Urine culture grew 100 K of Enterobacter. Antibiotics changed to cefepime -last dose 07/30/2023   Dysrhythmia, torsades  -Transient tachycardia with torsades noted 07/30/23, asymptomatic without hypotension -continue as needed rate control medications -Likely complication of profound/ongoing poor p.o. intake  Diabetes, uncontrolled with hypoglycemia (HCC) Possibly exacerbating encephalopathy, although mental status not improving despite correction of hypoglycemia Continue D10 until p.o. intake is more appropriate   Failure to thrive/moderate to severe protein caloric malnutrition  Concurrent edema due to hypoalbuminemia: Pt with low total protein and albumin.  Continue to advance diet as tolerated, currently patient is refusing any further p.o. intake even with family at bedside   Acute metabolic encephalopathy, stable, ongoing: -Likely multifactorial in the setting of covid,  hypoglycemia, UTI. -CT head negative for acute abnormality. -Despite resolution of hypoglycemia, completion of antibiotics for UTI and ongoing treatment in regards to COVID infection patient continues to remain altered and poorly interactive -Family is concerned for an unspecified chronic depressed state since July/previous hospitalization -no signs or reports of SI/HI   Chronic constipation -Continue senna and miralax.  -Hx of hemorrhoids with rectal bleeding in the past when she gets constipated.   Chronic kidney disease, stage 3b (HCC): -Chronic. Baseline Scr 1.0. -Avoid nephrotoxic  medications.  Continue hydration  Obstructive sleep apnea: Continue CPAP prn   History of pulmonary embolism: On DVT prophylaxis with 40 mg SQ lovenox.  Per last discharge summary, pt intolerant to restarting Eliquis due to recurrent hematuria.   Type 2 diabetes mellitus (HCC): Hold DM meds due to hypoglycemia.  Hypothyroidism: Stable. Continue synthroid 50 mcg   Essential hypertension: Sinus bradycardia, stable: Continue Coreg 6.25 mg qday.  Obesity: Diet and exercise discussed in detail Estimated body mass index is 37.62 kg/m as calculated from the following:   Height as of this encounter: 5\' 1"  (1.549 m).   Weight as of this encounter: 90.3 kg.    DVT prophylaxis: enoxaparin (LOVENOX) injection 40 mg Start: 07/25/23 1000 SCDs Start: 07/25/23 0431 Place TED hose Start: 07/25/23 0431 Code Status:   Code Status: Limited: Do not attempt resuscitation (DNR) -DNR-LIMITED -Do Not Intubate/DNI  Family Communication: Son daughter-in-law and sister updated Disposition Plan: Inpatient Remains inpatient appropriate because: Admitted for acute metabolic encephalopathy likely due to UTI , now requiring IV antibiotics. Patient also COVID-positive requiring supportive care.   Consultants:  None  Procedures: None  Antimicrobials:  Anti-infectives (From admission, onward)    Start     Dose/Rate Route Frequency Ordered Stop   07/27/23 1700  ceFEPIme (MAXIPIME) 2 g in sodium chloride 0.9 % 100 mL IVPB        2 g 200 mL/hr over 30 Minutes Intravenous Every 12 hours 07/27/23 1523 07/30/23 1841   07/25/23 2300  cefTRIAXone (ROCEPHIN) 1 g in sodium chloride 0.9 % 100 mL IVPB  Status:  Discontinued        1 g 200 mL/hr over 30 Minutes Intravenous Every 24 hours 07/25/23 0430 07/27/23 1523   07/24/23 2145  cefTRIAXone (ROCEPHIN) 1 g in sodium chloride 0.9 % 100 mL IVPB        1 g 200 mL/hr over 30 Minutes Intravenous  Once 07/24/23 2143 07/24/23 2330      Subjective: No acute  issues/events reported overnight -patient poorly responsive again today, review of systems markedly limited   Objective: Vitals:   07/30/23 1036 07/30/23 1157 07/30/23 1939 07/31/23 0700  BP: (!) 144/92 (!) 134/110 126/89   Pulse: 86 89 (!) 52   Resp: 14 18 18    Temp: 98.9 F (37.2 C)     TempSrc:      SpO2: 94% 100%    Weight:    90.3 kg  Height:        Intake/Output Summary (Last 24 hours) at 07/31/2023 1319 Last data filed at 07/30/2023 1631 Gross per 24 hour  Intake 841 ml  Output --  Net 841 ml   Filed Weights   07/29/23 0500 07/30/23 0500 07/31/23 0700  Weight: 86.7 kg 87 kg 90.3 kg    Examination:  General:  Pleasantly resting in bed, No acute distress.  Poorly interactive, will not follow commands or interact in any meaningful way HEENT: Blinks to threat, will not follow or track movement Neck:  Without mass or deformity.  Trachea is midline. Lungs:  Clear to auscultate bilaterally without rhonchi, wheeze, or rales. Heart:  Regular rate and rhythm.  Without murmurs, rubs, or gallops. Abdomen:  Soft, nontender, nondistended.  Without guarding or rebound. Extremities: Without cyanosis, clubbing, edema, or obvious deformity. Skin:  Warm and dry, no erythema.   Data Reviewed: I have personally reviewed following labs and imaging studies  CBC: Recent Labs  Lab 07/24/23 2047 07/26/23 1200 07/27/23 0613 07/30/23 0535  WBC 6.3 3.2* 2.6* 3.4*  HGB 9.1* 9.0* 8.6* 9.2*  HCT 28.4* 28.9* 26.8* 28.8*  MCV 96.6 97.6 93.1 96.0  PLT 264 242 240 238   Basic Metabolic Panel: Recent Labs  Lab 07/24/23 2047 07/26/23 1200 07/27/23 0613 07/30/23 0535  NA 138 134* 134* 128*  K 4.2 3.7 3.4* 4.4  CL 108 106 106 102  CO2 23 21* 20* 20*  GLUCOSE 67* 130* 104* 90  BUN 23 18 16 14   CREATININE 0.82 0.87 0.93 0.92  CALCIUM 8.2* 7.6* 7.3* 6.9*  MG  --  1.5* 1.8 1.9  PHOS  --  2.3* 2.8 2.3*   GFR: Estimated Creatinine Clearance: 52.4 mL/min (by C-G formula based on SCr of  0.92 mg/dL).  Liver Function Tests: Recent Labs  Lab 07/24/23 2047 07/26/23 1200  AST 128* 69*  ALT 49* 32  ALKPHOS 39 34*  BILITOT 0.6 0.5  PROT 4.9* 4.4*  ALBUMIN 2.0* 1.8*   Radiology Studies: CT HEAD WO CONTRAST ( )  Result Date: 07/29/2023 CLINICAL DATA:  Initial evaluation for new onset seizure. EXAM: CT HEAD WITHOUT CONTRAST TECHNIQUE: Contiguous axial images were obtained from the base of the skull through the vertex without intravenous contrast. RADIATION DOSE REDUCTION: This exam was performed according to the departmental dose-optimization program which includes automated exposure control, adjustment of the mA and/or kV according to patient size and/or use of iterative reconstruction technique. COMPARISON:  Prior CT from 07/24/2023. FINDINGS: Brain: Mild age-related cerebral atrophy with chronic small vessel ischemic disease. Remote lacunar infarcts noted about the bilateral basal ganglia, stable. No acute intracranial hemorrhage. No acute large vessel territory infarct. No mass lesion or midline shift. No hydrocephalus or extra-axial fluid collection. Vascular: No abnormal hyperdense vessel. Scattered vascular calcifications noted within the carotid siphons. Skull: Scalp soft tissues demonstrate no acute finding. Calvarium intact. Biparietal foramina noted. Sinuses/Orbits: Globes orbital soft tissues within normal limits. Scattered layering secretions and mucosal thickening noted about the sphenoid ethmoidal and maxillary sinuses. No mastoid effusion. Other: None. IMPRESSION: 1. No acute intracranial abnormality. 2. 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 3. Scattered layering secretions and mucosal thickening about the sphenoid ethmoidal and maxillary sinuses. Clinical correlation for acute sinusitis recommended. Electronically Signed   By: [name] M.D.   On:  07/29/2023 22:50    Scheduled Meds:  carvedilol  6.25 mg Oral Daily   Chlorhexidine Gluconate Cloth  6 each Topical Daily   enoxaparin (LOVENOX) injection  40 mg Subcutaneous Q24H   levothyroxine  50 mcg Oral QAC breakfast   pantoprazole  40 mg Oral Daily   polyethylene glycol  17 g Oral Daily   senna-docusate  2 tablet Oral Daily   Continuous Infusions:  amiodarone 150 mg (07/30/23 1607)   dextrose 10 % 1,000 mL with potassium chloride 20 mEq infusion 50 mL/hr at 07/31/23 1242    LOS: 6 days   Time spent: 35 mins  Azucena Fallen, DO [facility]   If 7PM-7AM, please contact night-coverage

## 2023-08-01 DIAGNOSIS — U071 COVID-19: Secondary | ICD-10-CM | POA: Diagnosis not present

## 2023-08-01 LAB — BASIC METABOLIC PANEL
Anion gap: 7 (ref 5–15)
BUN: 12 mg/dL (ref 8–23)
CO2: 19 mmol/L — ABNORMAL LOW (ref 22–32)
Calcium: 7.2 mg/dL — ABNORMAL LOW (ref 8.9–10.3)
Chloride: 107 mmol/L (ref 98–111)
Creatinine, Ser: 0.87 mg/dL (ref 0.44–1.00)
GFR, Estimated: >60 mL/min (ref >60)
Glucose, Bld: 93 mg/dL (ref 70–99)
Potassium: 4.3 mmol/L (ref 3.5–5.1)
Sodium: 133 mmol/L — ABNORMAL LOW (ref 135–145)

## 2023-08-01 LAB — GLUCOSE, CAPILLARY
Glucose-Capillary: 107 mg/dL — ABNORMAL HIGH (ref 70–99)
Glucose-Capillary: 86 mg/dL (ref 70–99)
Glucose-Capillary: 88 mg/dL (ref 70–99)
Glucose-Capillary: 92 mg/dL (ref 70–99)
Glucose-Capillary: 97 mg/dL (ref 70–99)

## 2023-08-01 LAB — CBC
HCT: 29.7 % — ABNORMAL LOW (ref 36.0–46.0)
Hemoglobin: 9.6 g/dL — ABNORMAL LOW (ref 12.0–15.0)
MCH: 30.5 pg (ref 26.0–34.0)
MCHC: 32.3 g/dL (ref 30.0–36.0)
MCV: 94.3 fL (ref 80.0–100.0)
Platelets: 258 10*3/uL (ref 150–400)
RBC: 3.15 MIL/uL — ABNORMAL LOW (ref 3.87–5.11)
RDW: 15.9 % — ABNORMAL HIGH (ref 11.5–15.5)
WBC: 4 10*3/uL (ref 4.0–10.5)
nRBC: 0 % (ref 0.0–0.2)

## 2023-08-01 MED ORDER — MODAFINIL 200 MG PO TABS
100.0000 mg | ORAL_TABLET | Freq: Every day | ORAL | Status: DC
  Administered 2023-08-01 – 2023-08-04 (×3): 100 mg via ORAL
  Filled 2023-08-01 (×4): qty 1

## 2023-08-01 MED ORDER — HYDROMORPHONE HCL 1 MG/ML IJ SOLN
0.2500 mg | Freq: Once | INTRAMUSCULAR | Status: AC
  Administered 2023-08-01: 0.25 mg via INTRAVENOUS
  Filled 2023-08-01: qty 0.5

## 2023-08-01 NOTE — Progress Notes (Signed)
Speech Language Pathology Treatment: Dysphagia  Patient Details Name: Tricia Harrison MRN: [id number] DOB: [birth year] Today's Date: 08/01/2023 Time: 4696-2952 SLP Time Calculation (min) (ACUTE ONLY): 33 min  Assessment / Plan / Recommendation Clinical Impression  SLP received order to reassess Tricia Harrison's swallowing. Patient in room with her sister Tricia Harrison with her head turned left upon SLP arrival. Introduced myself to patient's sister and explained prior modified barium swallow study and x-ray and assessed patient during session today. Though patient's mentation is better, she is more alert than she was during initial modified barium swallow on 830, her swallow function appears worse with increased delay in initiation and post swallow congested cough. In addition wet vocal quality noted that was not observed on prior clinical eval or MBS. Patient continues to not follow directions consistently, only opening her mouth once during the session. P.o. trials included macaroni and cheese, ice cream, Ensure and ice chips. Patient masticated macaroni and cheese but then orally held due to her poor awareness despite being cued to swallow. At that time she did not open her mouth despite cues and SLP noted significant right oral retention requiring ice chip bolus to facilitate oral transit. This unfortunately was followed by patient's congested cough concerning for airway infiltration. At this time advised patient sister that her intake has been scant at best throughout the hospital course and her dysphagia does not allow adequate nutrition and hydration. Further advised to SLP role to mitigate aspiration but not prevent it at this time. Reviewed concept of comfort feeding and increased pulmonary risk as well as discomfort if patient is overtly coughing and choking with intake. Tricia Harrison reports that she has esophageal history and therefore understands the discomfort with coughing/choking. For Tricia Harrison at this  time comfort intake may be a realistic goal. Tricia Harrison inquired to patient's prognosis for swallowing and reports that she recovered during prior exacerbation of dysphagia from surgery. Discussed decreased functional reserve and with each medical illness and poor prognosis. In this SLPs opinon, prognosis for swallow to improve to functional level is guarded at best. Tricia Harrison reports understanding to information despite stating that it was not what she wanted to hear.     HPI HPI: 77 year old with history of chronic anemia, chronic back pain, chronic constipation, CKD stage IIIb, hypertension and hyperlipidemia, hypothyroidism, dementia, history of pulmonary embolism recently underwent right shoulder replacement, significant postoperative delirium and confusion and was ultimately sent to skilled nursing rehab, now admitted from skilled nursing facility with confusion found to have COVID and diabetic hypoglycemia.  Per MD note she also has a UTI.  Patient has undergone prior modified barium swallow studies x 2 and esophagram.  Most recent MBS was conducted 07/09/2023 and she was noted to have mild oropharyngeal dysphagia with impaired pharyngeal stripping and impaired tongue base retraction resulting in retention.  Requiring several swallows to diminish pharyngeal retention.  In addition prior esophagram shows patulous esophagus, esoph dysmotlity, small hiatal hernia and trace reflux.  Prior BSE with possible velopharyngeal dysfunction with hypernasality and complaints of nasal regugitation. MBS 01/14/22 without penetration or aspiration but possible decreased UES opening with barium pooled above UES.  Patient chest x-ray showed left more than right infiltrate.  Patient has undergone prior brain imaging and current CT scan was negative for acute change.  She does endorse worsening of her swallowing in the last 6 months.  Nurse reports she did not pass her Yale swallow screening and that family states patient has a  chronic dysphagia.  SLP Plan  Discharge SLP treatment due to (comment) (education completed)      Recommendations for follow up therapy are one component of a multi-disciplinary discharge planning process, led by the attending physician.  Recommendations may be updated based on patient status, additional functional criteria and insurance authorization.    Recommendations  Diet recommendations: Dysphagia 3 (mechanical soft);Thin liquid;Nectar-thick liquid (use ice to orally clear solids, check for oral retention and suction prn) Medication Administration: Crushed with puree Supervision: Full supervision/cueing for compensatory strategies Compensations: Slow rate;Small sips/bites;Other (Comment) (give ice chip to help elicit swallow after pt masticates solids) Postural Changes and/or Swallow Maneuvers: Seated upright 90 degrees;Upright 30-60 min after meal                        Dysphagia, oropharyngeal phase (R13.12)     Discharge SLP treatment due to (comment) (education completed)     Chales Abrahams  08/01/2023, 2:58 PM

## 2023-08-01 NOTE — Plan of Care (Signed)
  Problem: Clinical Measurements: Goal: Ability to maintain clinical measurements within normal limits will improve Outcome: Progressing Goal: Will remain free from infection Outcome: Progressing Goal: Respiratory complications will improve Outcome: Progressing Goal: Cardiovascular complication will be avoided Outcome: Progressing   Problem: Pain Managment: Goal: General experience of comfort will improve Outcome: Progressing   Problem: Safety: Goal: Ability to remain free from injury will improve Outcome: Progressing   Problem: Respiratory: Goal: Will maintain a patent airway Outcome: Progressing

## 2023-08-01 NOTE — Evaluation (Signed)
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  She presented back from the facility due to reported confusion.  Patient has no history of dementia. 06/17/23 underwent Right total shoulder replacement by Dr. Dion Saucier.   Clinical Impression   Patient evaluated by Occupational Therapy with no further acute OT needs identified. Family members present have been educated on recommended positioning strategies to address edema and provide support to BUEs and neck. Education provided to perform passive ROM exercises to BUE as pt is able to tolerate. Encouraged family to play familiar music, talk to her, have her try to follow commands, and visually track when visiting. Discussed sleep/wake cycle with recommendations for lights on and blinds open during day time as well as lights low at night time. At this time, pt does not demonstrate the ability to tolerate and/or participate in skilled OT services  due to level of cognition and medical concerns. Pt's HR elevated to 127 bpm when placed in chair position in bed requiting at least 10 minutes to return to 99 bpm when in resting position. Recommend long term care for patient with a turning schedule and gentle passive ROM stretching to BUE to prevent pressure sores and decrease further joint restrictions. If family were to take pt home, she would require 24/7 total care for BADL tasks at bed level. Pt would require a hospital bed and hoyer lift at least.  OT is signing off. Thank you  for this referral.        If plan is discharge home, recommend the following: Other (comment) (Total Assist of at least 2 people to provide all needed care.)    Functional Status Assessment  Patient has had a recent decline in their functional status and/or demonstrates limited ability to make significant improvements in function in a reasonable and predictable amount of time  Equipment Recommendations  Hoyer lift;Hospital bed       Precautions / Restrictions Precautions Precautions: Fall;Other (comment) Type of Shoulder Precautions: No RUE restrictions. No sling needed. -per Dr. Dion Saucier via secure chat 9/6. Precaution Booklet Issued: No Precaution Comments: No RUE precautions. Progress as tolerated. Restrictions Weight Bearing Restrictions: No Other Position/Activity Restrictions: No WB restrictions RUE. Pt is allowed to fully weight bear.      Mobility Bed Mobility Overal bed mobility: Needs Assistance Bed Mobility: Rolling Rolling: Total assist, +2 for physical assistance, +2 for safety/equipment         General bed mobility comments: Pt received assist for bathing from NT (2 person assist) and they reported total assistX2 required for rolling.    Transfers      General transfer comment: Not tested d/t medical concerns. Decreased cognition/direction following. HR elevated when placed in chair position in bed.      Balance Overall balance assessment: History of Falls         ADL either performed or assessed with clinical judgement   ADL  General ADL Comments: Patient requires total assist X2 at bed level for all BADL tasks.     Vision Baseline Vision/History:  (unknown) Ability to See in Adequate Light:  (  unknown) Additional Comments: Pt looked at family members when they spoke. May have demonstrated some visual tracking although limited.            Pertinent Vitals/Pain Pain Assessment Pain Assessment: PAINAD Breathing: normal Negative Vocalization:  occasional moan/groan, low speech, negative/disapproving quality Facial Expression: facial grimacing Body Language: tense, distressed pacing, fidgeting Consolability: no need to console PAINAD Score: 4 Pain Location: Pt demonstrated discomfort when mobilizing BLE and RUE. Pain Intervention(s): Limited activity within patient's tolerance, Repositioned     Extremity/Trunk Assessment Upper Extremity Assessment Upper Extremity Assessment: Right hand dominant;RUE deficits/detail;LUE deficits/detail RUE Deficits / Details: Sling removed. P/ROM to just under 90 degrees, Restricted er and not able to complete any P/ROM, full passive IR. Very limited passive elbow extension, supination/pronation. Functional wrist flexion/extension and hand P/ROM. Very weak gross grasp. Pt presents with increased tone and limited joint mobility throughout RUE. LUE Deficits / Details: Severe P/ROM present in shoulder, elbow, and wrist. Noted increased edema in IE. Coban wrap around IV site removed.   Lower Extremity Assessment Lower Extremity Assessment: Defer to PT evaluation   Cervical / Trunk Assessment Cervical / Trunk Assessment: Kyphotic;Other exceptions Cervical / Trunk Exceptions: scoliotic. Limited cervical P/ROM. Pt with head position in left rotation at rest. Able to passively rotate to nuetral although demonstrated discomfort.   Communication Communication Communication: Difficulty following commands/understanding;Difficulty communicating thoughts/reduced clarity of speech;Other (comment) (Pt was nonverbal during session. Only vocalized in response to pain when mobilizing.) Following commands:  (Only squeezed OT's hands when given a command. No other commands followed during session) Cueing Techniques: Verbal cues;Tactile cues;Visual cues   Cognition Arousal: Alert Behavior During Therapy: Flat affect Overall Cognitive Status: Impaired/Different from baseline Area of Impairment: Orientation, Attention,  Memory, Following commands, Safety/judgement, Awareness, Problem solving  Orientation Level: Disoriented to, Person, Place, Time, Situation Current Attention Level:  (Pt did not attend during session) Memory: Decreased recall of precautions, Decreased short-term memory Following Commands:  (unable to follow one step commands) Safety/Judgement: Decreased awareness of safety, Decreased awareness of deficits Awareness:  (demonstrated no signs of awareness.) Problem Solving:  (No signs of problem solving. pt requires total assist)       General Comments  LUE edema noted. Coban wrap removed around IV site. Extremity elevated and positioned with pillows/blanket. Skin maceration noted in right palm. OT completed hygiene.            Home Living Family/patient expects to be discharged to:: Skilled nursing facility         Prior Functioning/Environment Prior Level of Function : Needs assist  Cognitive Assist : Mobility (cognitive);ADLs (cognitive)     Physical Assist : Mobility (physical);ADLs (physical)     Mobility Comments: Pt has been bed bound since July 2024 after Right shoulder total arthroplasty. Prior to surgery, pt was limited with ambulation at home. Family reports that she would mobilize with Rolator. ADLs Comments: Prior to July 2024, pt had assistance from a home health aide who assisted with all ADL tasks. Now, pt is requiring total A at bed level.        OT Problem List: Decreased activity tolerance;Impaired balance (sitting and/or standing);Decreased coordination;Decreased knowledge of precautions;Decreased knowledge of use of DME or AE;Decreased strength;Decreased range of motion;Decreased cognition;Impaired UE functional use;Pain;Cardiopulmonary status limiting activity;Increased edema;Decreased safety awareness;Impaired tone      OT Treatment/Interventions:  (N/A)    OT Goals(Current goals can be found in the care plan section)    OT Frequency:  1X visit  Co-evaluation PT/OT/SLP Co-Evaluation/Treatment: Yes Reason for Co-Treatment: For patient/therapist safety;Complexity of the patient's impairments (multi-system involvement);To address functional/ADL transfers   OT goals addressed during session: Strengthening/ROM;Other (comment) (cognition)      AM-PAC OT "6 Clicks" Daily Activity     Outcome Measure Help from another person eating meals?: Total Help from another person taking care of personal grooming?: Total Help from another person toileting, which includes using toliet, bedpan, or urinal?: Total Help from another person bathing (including washing, rinsing, drying)?: Total Help from another person to put on and taking off regular upper body clothing?: Total Help from another person to put on and taking off regular lower body clothing?: Total 6 Click Score: 6   End of Session Nurse Communication: Mobility status  Activity Tolerance: Treatment limited secondary to medical complications (Comment) (decreased cognition, pain, elevated HR with positional changes.) Patient left: in bed;with call bell/phone within reach;with bed alarm set;with family/visitor present  OT Visit Diagnosis: Muscle weakness (generalized) (M62.81);Cognitive communication deficit (R41.841);Pain;History of falling (Z91.81);Unsteadiness on feet (R26.81) Pain - Right/Left:  (right shoulder, bilateral legs) Pain - part of body: Shoulder;Leg;Ankle and joints of foot;Knee;Hip                Time: 1610-9604 OT Time Calculation (min): 55 min Charges:  OT General Charges $OT Visit: 1 Visit OT Evaluation $OT Eval High Complexity: 1 High OT Treatments $Therapeutic Activity: 8-22 mins  Limmie Oluwademilade, OTR/L,CBIS  Supplemental OT - [facility] and [facility] Secure Chat Preferred    Serrena Linderman, Charisse March 08/01/2023, 11:43 AM

## 2023-08-01 NOTE — Evaluation (Addendum)
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  She presented back from the facility due to reported confusion.  Patient has no history of dementia. 06/17/23 underwent Right total shoulder replacement by Dr. Dion Saucier.  Clinical Impression  Pt unable to verbalize PLOF, home setup, or verbally answer any questions during evaluation; family at bedside providing all history. Pt without LE muscle activation despite verbal, tactile and guiding cues. Pt with audible moaning with LE movement with minimal hip/knee flexion and repositioning pillow under heels to decrease pressure. Educated family on lights on, audible interactions, visually tracking and encouraging command following while in room. Provided education on edema positioning to family members present. Pt noted to have increase HR from 90s to 127 max noted while in chair position in bed, lowered pt to supine and HR returns to 90s after ~10 minutes of rest. Pt BP 147/111 and SpO2 >92% on RA, notified RN of readings. No acute PT needs identified, recommend long term care and pt would benefit from turning schedule and positioning to decrease contractures and maintain skin integrity. Recommend 24/7 assist with hospital bed if returning home with family support.       If plan is discharge home, recommend the following:     Can travel by private vehicle   No    Equipment Recommendations None recommended by PT  Recommendations for Other Services       Functional Status  Assessment Patient has had a recent decline in their functional status and demonstrates the ability to make significant improvements in function in a reasonable and predictable amount of time.     Precautions / Restrictions Precautions Precautions: Fall;Other (comment) Type of Shoulder Precautions: No RUE restrictions. No sling needed. -per Dr. Dion Saucier via secure chat 9/6. Precaution Booklet Issued: No Precaution Comments: No RUE precautions. Progress as tolerated. Restrictions Weight Bearing Restrictions: No Other Position/Activity Restrictions: No WB restrictions RUE. Pt is allowed to fully weight bear.      Mobility  Bed Mobility Overal bed mobility: Needs Assistance Bed Mobility: Rolling Rolling: Total assist, +2 for physical assistance, +2 for safety/equipment         General bed mobility comments: Pt received assist for bathing from NT (2 person assist) and they reported total assistX2 required for rolling; total A+2 to scoot up in bed with use of bedpad and to reposition to comfort    Transfers                   General transfer comment: Not tested d/t medical concerns. Decreased cognition/direction following. HR elevated when placed in chair position in bed, 127 max noted    Ambulation/Gait                  Stairs            Wheelchair Mobility     Tilt Bed    Modified Rankin (Stroke Patients Only)       Balance  Pertinent Vitals/Pain Pain Assessment Pain Assessment: Faces Faces Pain Scale: Hurts little more Pain Location: Pt demonstrated discomfort when mobilizing BLE and RUE. Pain Descriptors / Indicators: Grimacing Pain Intervention(s): Limited activity within patient's tolerance, Repositioned, Monitored during session    Home Living Family/patient expects to be discharged to:: Skilled nursing facility                        Prior Function Prior Level of  Function : Needs assist  Cognitive Assist : Mobility (cognitive);ADLs (cognitive)     Physical Assist : Mobility (physical);ADLs (physical)     Mobility Comments: Pt has been bed bound since July 2024 after Right shoulder total arthroplasty. Prior to surgery, pt was limited with ambulation at home. Family reports that she would mobilize with Rolator. ADLs Comments: Prior to July 2024, pt had assistance from a home health aide who assisted with all ADL tasks. Now, pt is requiring total A at bed level.     Extremity/Trunk Assessment   Upper Extremity Assessment Upper Extremity Assessment: Defer to OT evaluation RUE Deficits / Details: Sling removed. P/ROM to just under 90 degrees, Restricted er and not able to complete any P/ROM, full passive IR. Very limited passive elbow extension, supination/pronation. Functional wrist flexion/extension and hand P/ROM. Very weak gross grasp. Pt presents with increased tone and limited joint mobility throughout RUE. LUE Deficits / Details: Severe P/ROM present in shoulder, elbow, and wrist. Noted increased edema in IE. Coban wrap around IV site removed.    Lower Extremity Assessment Lower Extremity Assessment: Defer to PT evaluation RLE Deficits / Details: PROM limited throughout ankle, knee and hip; pt resting in plantarflexion, knee extension and netural hip positioning; no active movement noted despite verbal, tactile and guiding cues LLE Deficits / Details: PROM limited throughout ankle, knee and hip; pt resting in plantarflexion, hip external rotation, knee flexion ~45 deg; no active movement noted despite verbal, tactile and guiding cues    Cervical / Trunk Assessment Cervical / Trunk Assessment: Kyphotic;Other exceptions Cervical / Trunk Exceptions: scoliotic. Limited cervical P/ROM. Pt with head position in left rotation at rest. Able to passively rotate to nuetral although demonstrated discomfort.  Communication   Communication Communication:  Difficulty following commands/understanding;Difficulty communicating thoughts/reduced clarity of speech;Other (comment) (Pt was nonverbal during session. Only vocalized in response to pain when mobilizing.) Following commands:  (Only squeezed OT's hands when given a command. No other commands followed during session) Cueing Techniques: Verbal cues;Tactile cues;Visual cues  Cognition Arousal: Alert Behavior During Therapy: Flat affect Overall Cognitive Status: Impaired/Different from baseline Area of Impairment: Orientation, Attention, Memory, Following commands, Safety/judgement, Awareness, Problem solving                 Orientation Level: Disoriented to, Person, Place, Time, Situation Current Attention Level:  (Pt did not attend during session) Memory: Decreased recall of precautions, Decreased short-term memory Following Commands:  (unable to follow one step commands) Safety/Judgement: Decreased awareness of safety, Decreased awareness of deficits Awareness:  (demonstrated no signs of awareness.) Problem Solving:  (No signs of problem solving. pt requires total assist)          General Comments General comments (skin integrity, edema, etc.): LUE edema noted. Coban wrap removed around IV site. Extremity elevated and positioned with pillows/blanket. Skin maceration noted in right palm. OT completed hygiene.    Exercises     Assessment/Plan    PT Assessment Patient does not need any further PT services  PT Problem  List Decreased strength;Decreased activity tolerance;Decreased mobility;Decreased safety awareness;Decreased range of motion;Decreased balance;Decreased knowledge of precautions;Decreased cognition;Cardiopulmonary status limiting activity       PT Treatment Interventions DME instruction;Therapeutic activities;Cognitive remediation;Gait training;Functional mobility training;Therapeutic exercise;Patient/family education;Balance training;Wheelchair mobility training     PT Goals (Current goals can be found in the Care Plan section)  Acute Rehab PT Goals Patient Stated Goal: "get her to wake up" PT Goal Formulation: With patient/family Time For Goal Achievement: 08/15/23 Potential to Achieve Goals: Poor    Frequency       Co-evaluation   Reason for Co-Treatment: For patient/therapist safety;Complexity of the patient's impairments (multi-system involvement);To address functional/ADL transfers   OT goals addressed during session: Strengthening/ROM;Other (comment) (cognition)       AM-PAC PT "6 Clicks" Mobility  Outcome Measure Help needed turning from your back to your side while in a flat bed without using bedrails?: Total Help needed moving from lying on your back to sitting on the side of a flat bed without using bedrails?: Total Help needed moving to and from a bed to a chair (including a wheelchair)?: Total Help needed standing up from a chair using your arms (e.g., wheelchair or bedside chair)?: Total Help needed to walk in hospital room?: Total Help needed climbing 3-5 steps with a railing? : Total 6 Click Score: 6    End of Session   Activity Tolerance: Patient limited by lethargy Patient left: in bed;with call bell/phone within reach;with family/visitor present Nurse Communication: Mobility status;Weight bearing status PT Visit Diagnosis: Muscle weakness (generalized) (M62.81);Difficulty in walking, not elsewhere classified (R26.2)    Time: 5409-8119 PT Time Calculation (min) (ACUTE ONLY): 55 min   Charges:   PT Evaluation $PT Eval High Complexity: 1 High PT Treatments $Therapeutic Activity: 8-22 mins           Tori Tawana Pasch PT, DPT 08/01/23, 1:48 PM

## 2023-08-01 NOTE — Progress Notes (Signed)
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  She presented back from the facility due to reported confusion.  Patient has no history of dementia.  Patient was reportedly diagnosed with COVID yesterday at the facility.  UA also positive for leukocyte Estrace many bacteria. Ammonia level is less than 10. CT head negative for acute abnormality.  Patient blood sugar was also found to be low 62.  Patient is admitted for further evaluation.  Assessment & Plan:   Principal Problem:   COVID-19 virus infection Active Problems:   Acute metabolic encephalopathy   Edema due to hypoalbuminemia   Diabetic hypoglycemia (HCC)   Catheter-associated urinary tract infection (HCC)   Essential hypertension   Hypothyroidism   Morbid obesity (HCC)   Type 2 diabetes mellitus (HCC)   History of pulmonary embolism   Obstructive sleep apnea   Chronic kidney disease, stage 3b (HCC)   Chronic constipation    Failure to thrive, goals of care -Lengthy discussion with son who is power of attorney as well as daughter-in-law and sister about goals of care -Patient remains DNR, discussion today in regards to hospice and palliative care. Will consult palliative care for further insight and recommendations.  COVID-19 infection: Without fever, hypoxia, no indication for ongoing treatment  Catheter-associated urinary tract infection (HCC), resolved UA consistent with UTI. Initiated on IV Rocephin. Urine culture grew 100 K of Enterobacter. Antibiotics  changed to cefepime -last dose 07/30/2023   Dysrhythmia, torsades  - Transient tachycardia with torsades noted 07/30/23, asymptomatic without hypotension -continue as needed rate control medications - Likely complication of profound/ongoing poor p.o. intake  Diabetes, uncontrolled with hypoglycemia (HCC) Possibly exacerbating encephalopathy, although mental status not improving despite correction of hypoglycemia Continue D10 until p.o. intake is more appropriate   Failure to thrive/moderate to severe protein caloric malnutrition  Concurrent edema due to hypoalbuminemia: Pt with low total protein and albumin.  Continue to advance diet as tolerated, currently patient is refusing any further p.o. intake even with family at bedside   Acute metabolic encephalopathy, stable, ongoing: -Likely multifactorial in the setting of covid, hypoglycemia, UTI. -CT head negative for acute abnormality. -Despite resolution of hypoglycemia, completion of antibiotics for UTI and ongoing treatment in regards to COVID infection patient continues to remain altered and poorly interactive -Family is concerned for an unspecified chronic depressed state since July/previous hospitalization -no signs or reports of SI/HI   Chronic constipation -Continue senna and miralax.  -Hx of hemorrhoids with rectal bleeding in the past when she gets constipated.   Chronic kidney disease, stage 3b (HCC): -Chronic. Baseline Scr 1.0. -Avoid nephrotoxic medications. Continue hydration.   Obstructive sleep apnea: Continue CPAP prn   History of pulmonary embolism: On DVT prophylaxis with 40 mg SQ lovenox.  Per last discharge summary, pt intolerant to restarting Eliquis due to recurrent hematuria.   Type 2 diabetes mellitus (HCC): Hold DM meds due to hypoglycemia.  Hypothyroidism: Stable. Continue synthroid 50 mcg   Essential hypertension: Sinus bradycardia, stable: Continue Coreg 6.25 mg qday.  Obesity: Diet and exercise  discussed in detail Estimated body mass  index is 36.53 kg/m as calculated from the following:   Height as of this encounter: 5\' 1"  (1.549 m).   Weight as of this encounter: 87.7 kg.    DVT prophylaxis: enoxaparin (LOVENOX) injection 40 mg Start: 07/25/23 1000 SCDs Start: 07/25/23 0431 Place TED hose Start: 07/25/23 0431 Code Status:   Code Status: Limited: Do not attempt resuscitation (DNR) -DNR-LIMITED -Do Not Intubate/DNI  Family Communication: Son daughter-in-law and sister updated Disposition Plan: Inpatient   Consultants:  None  Procedures: None  Antimicrobials:  Anti-infectives (From admission, onward)    Start     Dose/Rate Route Frequency Ordered Stop   07/27/23 1700  ceFEPIme (MAXIPIME) 2 g in sodium chloride 0.9 % 100 mL IVPB        2 g 200 mL/hr over 30 Minutes Intravenous Every 12 hours 07/27/23 1523 07/30/23 1841   07/25/23 2300  cefTRIAXone (ROCEPHIN) 1 g in sodium chloride 0.9 % 100 mL IVPB  Status:  Discontinued        1 g 200 mL/hr over 30 Minutes Intravenous Every 24 hours 07/25/23 0430 07/27/23 1523   07/24/23 2145  cefTRIAXone (ROCEPHIN) 1 g in sodium chloride 0.9 % 100 mL IVPB        1 g 200 mL/hr over 30 Minutes Intravenous  Once 07/24/23 2143 07/24/23 2330      Subjective: No acute issues/events reported overnight -patient poorly responsive again today, review of systems markedly limited   Objective: Vitals:   07/31/23 1758 07/31/23 2241 08/01/23 0144 08/01/23 0631  BP: (!) 144/60 (!) 135/94 (!) 134/94 (!) 138/90  Pulse:  (!) 109 (!) 110 81  Resp: 12 18 16 18   Temp: 98.6 F (37 C) 98.2 F (36.8 C) 98.8 F (37.1 C) 98.2 F (36.8 C)  TempSrc: Axillary Axillary Axillary   SpO2: 100% 98% 98% 100%  Weight:    87.7 kg  Height:        Intake/Output Summary (Last 24 hours) at 08/01/2023 0750 Last data filed at 08/01/2023 0500 Gross per 24 hour  Intake 0 ml  Output 2800 ml  Net -2800 ml   Filed Weights   07/30/23 0500 07/31/23 0700 08/01/23  0631  Weight: 87 kg 90.3 kg 87.7 kg    Examination:  General:  Pleasantly resting in bed, No acute distress.  Poorly interactive, will not follow commands or interact in any meaningful way HEENT: Blinks to threat, will not follow or track movement Neck:  Without mass or deformity.  Trachea is midline. Lungs:  Clear to auscultate bilaterally without rhonchi, wheeze, or rales. Heart:  Regular rate and rhythm.  Without murmurs, rubs, or gallops. Abdomen:  Soft, nontender, nondistended.  Without guarding or rebound. Extremities: Without cyanosis, clubbing, edema, or obvious deformity. Noted resting tremor of L foot, minimally noted in R toes and L fingers. Skin:  Warm and dry, no erythema.   Data Reviewed: I have personally reviewed following labs and imaging studies  CBC: Recent Labs  Lab 07/26/23 1200 07/27/23 0613 07/30/23 0535 08/01/23 0551  WBC 3.2* 2.6* 3.4* 4.0  HGB 9.0* 8.6* 9.2* 9.6*  HCT 28.9* 26.8* 28.8* 29.7*  MCV 97.6 93.1 96.0 94.3  PLT 242 240 238 258   Basic Metabolic Panel: Recent Labs  Lab 07/26/23 1200 07/27/23 0613 07/30/23 0535 08/01/23 0551  NA 134* 134* 128* 133*  K 3.7 3.4* 4.4 4.3  CL 106 106 102 107  CO2 21* 20* 20* 19*  GLUCOSE 130* 104* 90 93  BUN 18 16  14 12  CREATININE 0.87 0.93 0.92 0.87  CALCIUM 7.6* 7.3* 6.9* 7.2*  MG 1.5* 1.8 1.9  --   PHOS 2.3* 2.8 2.3*  --    GFR: Estimated Creatinine Clearance: 54.5 mL/min (by C-G formula based on SCr of 0.87 mg/dL).  Liver Function Tests: Recent Labs  Lab 07/26/23 1200  AST 69*  ALT 32  ALKPHOS 34*  BILITOT 0.5  PROT 4.4*  ALBUMIN 1.8*   Radiology Studies: No results found.  Scheduled Meds:  carvedilol  6.25 mg Oral Daily   Chlorhexidine Gluconate Cloth  6 each Topical Daily   enoxaparin (LOVENOX) injection  40 mg Subcutaneous Q24H   levothyroxine  50 mcg Oral QAC breakfast   megestrol  400 mg Oral BID   pantoprazole  40 mg Oral Daily   polyethylene glycol  17 g Oral Daily    senna-docusate  2 tablet Oral Daily   Continuous Infusions:  amiodarone 150 mg (07/30/23 1607)   dextrose 10 % 1,000 mL with potassium chloride 20 mEq infusion 50 mL/hr at 07/31/23 1242    LOS: 7 days   Time spent: 35 mins  Azucena Fallen, DO [facility]   If 7PM-7AM, please contact night-coverage

## 2023-08-01 NOTE — TOC Progression Note (Addendum)
Transition of Care [facility]) - Progression Note    Patient Details  Name: Tricia Harrison MRN: [id number] Date of Birth: [birth year]  Transition of Care [facility]) CM/SW Contact  Coralyn Helling, [place] Phone Number: 08/01/2023, 12:41 PM  Clinical Narrative:   TOC spoke patients family about returning to facility with palliative vs hospice. Family is interested in patient returning with palliative. Patient will need new auth. TOC will follow for disposition. TOC notified facility that patient may return with palliative pending hospital course.   Family inquired about palliative at home. Patient may be hospice level. Spoke with attending who will have palliative team speak with family.    Expected Discharge Plan: Skilled Nursing Facility Barriers to Discharge: Continued Medical Work up  Expected Discharge Plan and Services     Post Acute Care Choice: Skilled Nursing Facility Living arrangements for the past 2 months: Skilled Nursing Facility                                       Social Determinants of Health (SDOH) Interventions SDOH Screenings   Food Insecurity: No Food Insecurity (07/05/2023)  Housing: Patient Unable To Answer (07/05/2023)  Transportation Needs: No Transportation Needs (07/26/2023)  Utilities: Not At Risk (07/26/2023)  Depression (PHQ2-9): Low Risk  (11/09/2018)  Tobacco Use: Medium Risk (07/24/2023)    Readmission Risk Interventions    07/08/2023    4:14 PM  Readmission Risk Prevention Plan  Transportation Screening Complete  PCP or Specialist Appt within 5-7 Days Complete  Home Care Screening Complete  Medication Review (RN CM) Complete

## 2023-08-02 ENCOUNTER — Other Ambulatory Visit: Payer: Self-pay | Admitting: Cardiology

## 2023-08-02 DIAGNOSIS — U071 COVID-19: Secondary | ICD-10-CM | POA: Diagnosis not present

## 2023-08-02 LAB — GLUCOSE, CAPILLARY
Glucose-Capillary: 74 mg/dL (ref 70–99)
Glucose-Capillary: 87 mg/dL (ref 70–99)
Glucose-Capillary: 98 mg/dL (ref 70–99)

## 2023-08-02 NOTE — Plan of Care (Signed)
Pt has severe pain with turns. Pt given a one time dose of IV pain medicine overnight. Pt continues to have very little to no PO intake. Problem: Clinical Measurements: Goal: Will remain free from infection Outcome: Progressing   Problem: Pain Managment: Goal: General experience of comfort will improve Outcome: Progressing   Problem: Respiratory: Goal: Will maintain a patent airway Outcome: Progressing Goal: Complications related to the disease process, condition or treatment will be avoided or minimized Outcome: Progressing

## 2023-08-02 NOTE — Progress Notes (Signed)
PROGRESS NOTE    Tricia Harrison  PIR:[id number] DOB: [birth year] DOA: 07/24/2023 PCP: Darrow Bussing, MD   Brief Narrative:  This 77 years old female with PMH significant for morbid obesity, type 2 diabetes, history of PE, CKD stage IIIb with baseline creatinine 1.0, hypertension, recent chronic Foley catheter placement due to urinary retention, chronic edema due to hypoalbuminemia, chronic anemia from chronic kidney disease, chronic back pain who was discharged to [facility] nursing home on 07/16/2023.  She presented back from the facility due to reported confusion.  Patient has no history of dementia.  Patient was reportedly diagnosed with COVID yesterday at the facility.  UA also positive for leukocyte Estrace many bacteria. Ammonia level is less than 10. CT head negative for acute abnormality.  Patient blood sugar was also found to be low 62.  Patient is admitted for further evaluation.  Assessment & Plan:   Principal Problem:   COVID-19 virus infection Active Problems:   Acute metabolic encephalopathy   Edema due to hypoalbuminemia   Diabetic hypoglycemia (HCC)   Catheter-associated urinary tract infection (HCC)   Essential hypertension   Hypothyroidism   Morbid obesity (HCC)   Type 2 diabetes mellitus (HCC)   History of pulmonary embolism   Obstructive sleep apnea   Chronic kidney disease, stage 3b (HCC)   Chronic constipation    Failure to thrive, goals of care -Lengthy discussion with son who is power of attorney as well as daughter-in-law and sister about goals of care -Patient remains DNR, discussion today in regards to hospice and palliative care.  Appreciate palliative care insight and recommendations - likely discharge to hospice at home vs facility(hospice house)  COVID-19 infection: Without fever, hypoxia, no indication for ongoing treatment  Catheter-associated urinary tract infection (HCC), Enterobacter, resolved UA consistent with UTI. Completed cefepime  -last dose 07/30/2023   Dysrhythmia, torsades; resolved - Transient tachycardia with torsades noted 07/30/23, asymptomatic without hypotension -continue as needed rate control medications - Likely complication of profound/ongoing poor p.o. intake  Diabetes, uncontrolled with hypoglycemia (HCC) Possibly exacerbating encephalopathy, although mental status not improving despite correction of hypoglycemia Continue D10 until p.o. intake is more appropriate   Failure to thrive/moderate to severe protein caloric malnutrition  Concurrent edema due to hypoalbuminemia: Pt with low total protein and albumin.  Continue to advance diet as tolerated, currently patient is refusing any further p.o. intake even with family at bedside   Acute metabolic encephalopathy, stable, ongoing: -Likely multifactorial in the setting of covid, hypoglycemia, UTI. -CT head negative for acute abnormality. -Despite resolution of hypoglycemia, completion of antibiotics for UTI and ongoing treatment in regards to COVID infection patient continues to remain altered and poorly interactive -Not improving despite modafinil (shown to improve covid encephalopathy in some patients)   Chronic constipation -Continue senna and miralax.  -Hx of hemorrhoids with rectal bleeding in the past when she gets constipated.   Chronic kidney disease, stage 3b (HCC): -Chronic. Baseline Scr 1.0. -Avoid nephrotoxic medications. Continue hydration.   Obstructive sleep apnea: Continue CPAP prn   History of pulmonary embolism: On DVT prophylaxis with 40 mg SQ lovenox.  Per last discharge summary, pt intolerant to restarting Eliquis due to recurrent hematuria.   Type 2 diabetes mellitus (HCC): Hold DM meds due to hypoglycemia.  Hypothyroidism: Stable. Continue synthroid 50 mcg   Essential hypertension: Sinus bradycardia, stable: Continue Coreg 6.25 mg qday.  Obesity: Diet and exercise discussed in detail Estimated body mass index is  36.53 kg/m as calculated from the following:  Height as of this encounter: 5\' 1"  (1.549 m).   Weight as of this encounter: 87.7 kg.    DVT prophylaxis: enoxaparin (LOVENOX) injection 40 mg Start: 07/25/23 1000 SCDs Start: 07/25/23 0431 Place TED hose Start: 07/25/23 0431 Code Status:   Code Status: Limited: Do not attempt resuscitation (DNR) -DNR-LIMITED -Do Not Intubate/DNI  Family Communication: Son, daughter-in-law, and sister updated Disposition Plan: Inpatient   Consultants:  None  Procedures: None  Antimicrobials:  Anti-infectives (From admission, onward)    Start     Dose/Rate Route Frequency Ordered Stop   07/27/23 1700  ceFEPIme (MAXIPIME) 2 g in sodium chloride 0.9 % 100 mL IVPB        2 g 200 mL/hr over 30 Minutes Intravenous Every 12 hours 07/27/23 1523 07/30/23 1841   07/25/23 2300  cefTRIAXone (ROCEPHIN) 1 g in sodium chloride 0.9 % 100 mL IVPB  Status:  Discontinued        1 g 200 mL/hr over 30 Minutes Intravenous Every 24 hours 07/25/23 0430 07/27/23 1523   07/24/23 2145  cefTRIAXone (ROCEPHIN) 1 g in sodium chloride 0.9 % 100 mL IVPB        1 g 200 mL/hr over 30 Minutes Intravenous  Once 07/24/23 2143 07/24/23 2330      Subjective: No acute issues/events reported overnight -patient poorly responsive again today, review of systems markedly limited   Objective: Vitals:   08/01/23 1737 08/01/23 2116 08/02/23 0215 08/02/23 0543  BP: (!) 153/99 (!) 140/94 122/67 126/80  Pulse: (!) 117 (!) 120 (!) 105 (!) 105  Resp:  15 15 16   Temp: 98.8 F (37.1 C) 98.6 F (37 C) 98.4 F (36.9 C) 98.3 F (36.8 C)  TempSrc: Axillary Oral  Oral  SpO2: 100% 98% 96% 100%  Weight:      Height:        Intake/Output Summary (Last 24 hours) at 08/02/2023 0737 Last data filed at 08/02/2023 1610 Gross per 24 hour  Intake 5 ml  Output 1000 ml  Net -995 ml   Filed Weights   07/30/23 0500 07/31/23 0700 08/01/23 0631  Weight: 87 kg 90.3 kg 87.7 kg     Examination:  General:  Pleasantly resting in bed, No acute distress.  Poorly interactive, will not follow commands or interact in any meaningful way HEENT: Blinks to threat, will not follow or track movement Neck:  Without mass or deformity.  Trachea is midline. Lungs:  Clear to auscultate bilaterally without rhonchi, wheeze, or rales. Heart:  Regular rate and rhythm.  Without murmurs, rubs, or gallops. Abdomen:  Soft, nontender, nondistended.  Without guarding or rebound. Extremities: Without cyanosis, clubbing, edema, or obvious deformity. Noted resting tremor of L foot, minimally noted in R toes and L fingers. Skin:  Warm and dry, no erythema.   Data Reviewed: I have personally reviewed following labs and imaging studies  CBC: Recent Labs  Lab 07/26/23 1200 07/27/23 0613 07/30/23 0535 08/01/23 0551  WBC 3.2* 2.6* 3.4* 4.0  HGB 9.0* 8.6* 9.2* 9.6*  HCT 28.9* 26.8* 28.8* 29.7*  MCV 97.6 93.1 96.0 94.3  PLT 242 240 238 258   Basic Metabolic Panel: Recent Labs  Lab 07/26/23 1200 07/27/23 0613 07/30/23 0535 08/01/23 0551  NA 134* 134* 128* 133*  K 3.7 3.4* 4.4 4.3  CL 106 106 102 107  CO2 21* 20* 20* 19*  GLUCOSE 130* 104* 90 93  BUN 18 16 14 12   CREATININE 0.87 0.93 0.92 0.87  CALCIUM 7.6* 7.3*  6.9* 7.2*  MG 1.5* 1.8 1.9  --   PHOS 2.3* 2.8 2.3*  --    GFR: Estimated Creatinine Clearance: 54.5 mL/min (by C-G formula based on SCr of 0.87 mg/dL).  Liver Function Tests: Recent Labs  Lab 07/26/23 1200  AST 69*  ALT 32  ALKPHOS 34*  BILITOT 0.5  PROT 4.4*  ALBUMIN 1.8*   Radiology Studies: No results found.  Scheduled Meds:  carvedilol  6.25 mg Oral Daily   Chlorhexidine Gluconate Cloth  6 each Topical Daily   enoxaparin (LOVENOX) injection  40 mg Subcutaneous Q24H   levothyroxine  50 mcg Oral QAC breakfast   megestrol  400 mg Oral BID   modafinil  100 mg Oral Daily   pantoprazole  40 mg Oral Daily   polyethylene glycol  17 g Oral Daily    senna-docusate  2 tablet Oral Daily   Continuous Infusions:  amiodarone 150 mg (07/30/23 1607)   dextrose 10 % 1,000 mL with potassium chloride 20 mEq infusion 50 mL/hr at 08/02/23 0456    LOS: 8 days   Time spent: 35 mins  Azucena Fallen, DO [facility]   If 7PM-7AM, please contact night-coverage

## 2023-08-02 NOTE — Progress Notes (Signed)
A consult was placed to the hospital's IV Nurse for new IV access; multiple family members at bedside observing; family requests to only use the left arm, as the "right arm just came out of a sling and she can't move it well";  left arm warmed w heat pack, and ultrasound used; very poor peripheral access;  able to place iv on the 2nd attempt, just above the LAC area;  no other healthy veins noted, and unable to fully assess inner aspect of the left arm, as she is unable to turn her arm over so that her palm is up;

## 2023-08-02 NOTE — Plan of Care (Signed)

## 2023-08-03 DIAGNOSIS — U071 COVID-19: Secondary | ICD-10-CM | POA: Diagnosis not present

## 2023-08-03 LAB — GLUCOSE, CAPILLARY
Glucose-Capillary: 101 mg/dL — ABNORMAL HIGH (ref 70–99)
Glucose-Capillary: 146 mg/dL — ABNORMAL HIGH (ref 70–99)
Glucose-Capillary: 67 mg/dL — ABNORMAL LOW (ref 70–99)
Glucose-Capillary: 83 mg/dL (ref 70–99)
Glucose-Capillary: 91 mg/dL (ref 70–99)
Glucose-Capillary: 92 mg/dL (ref 70–99)

## 2023-08-03 LAB — URINALYSIS, ROUTINE W REFLEX MICROSCOPIC
Bacteria, UA: NONE SEEN
Bilirubin Urine: NEGATIVE
Glucose, UA: NEGATIVE mg/dL
Ketones, ur: NEGATIVE mg/dL
Nitrite: NEGATIVE
Protein, ur: 30 mg/dL — AB
Specific Gravity, Urine: 1.008 (ref 1.005–1.030)
pH: 6 (ref 5.0–8.0)

## 2023-08-03 NOTE — Consult Note (Signed)
Consultation Note Date: 08/03/2023   Patient Name: Tricia Harrison  DOB: August 14, 1946  MRN: [id number]  Age / Sex: 77 y.o., female  PCP: Darrow Bussing, MD Referring Physician: Azucena Fallen, MD  Reason for Consultation: Establishing goals of care  HPI/Patient Profile: 77 y.o. female admitted on 07/24/2023 .   Clinical Assessment and Goals of Care: 77 year old lady with high BMI, history of pulmonary embolism, stage IIIb chronic kidney disease with baseline creatinine 1.0, hypertension.  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 Was recently discharged [facility] nursing home on 07-16-2023. Admitted with COVID infection as well as urinary tract infection Of consult for goals of care discussions. Patient has failure to thrive. Status is DO NOT RESUSCITATE Palliative consult for additional goals of care discussions has been requested.  Chart reviewed.  Patient seen.  Call placed and was able to reach patient's son and daughter-in-law-see discussion below.  Also updated Dr. Natale Milch [facility] colleague.  NEXT OF KIN Is at home with her sister.  Son and daughter-in-law live nearby in the same neighborhood.  SUMMARY OF RECOMMENDATIONS   Goals of care discussions undertaken with the patient's primary caregiver his son and daughter-in-law on the phone.  Discussed about the patient's underlying conditions as well as her current condition.  Goals wishes and values attempted to be explored.  Patient was recently at [facility] rehab however has been requesting her family to come home.  She was living at home with her sister.  She was using a walker.  She has had gradual progressive functional decline over the course of the past several months.  Her oral intake has also slowly but surely been  diminishing.  Family is concerned about ongoing infection such as urinary tract infection.    We discussed about hospice philosophy of care and more of a focus on comfort measures and symptom management rather than efforts with rehab and physical therapy.  Family has been thinking about bringing her home.  We talked about what bringing her home with hospice will look like.    They are in agreement to proceed with hospice evaluation for home with hospice services on discharge.  They have hired a Sports coach in the past to be present with the patient and are looking into resuming the private aide services.  Overall, they are aware of the ongoing decline trajectory of the patient and wish to keep her in her own familiar surroundings at her home with her sister with comfort focused care and addition of hospice services. Thank you for the consult.  Code Status/Advance Care Planning: DNR   Symptom Management:     Palliative Prophylaxis:  Delirium Protocol  Psycho-social/Spiritual:  Desire for further Chaplaincy support:yes Additional Recommendations: Education on Hospice  Prognosis:  < 6 months  Discharge Planning: Home with Hospice      Primary Diagnoses: Present on Admission:  COVID-19 virus infection  Essential hypertension  Hypothyroidism  Morbid obesity (HCC)  History of pulmonary embolism  Chronic kidney disease, stage 3b (HCC)  Chronic constipation  Obstructive sleep apnea   I have reviewed the medical record, interviewed the patient and family, and examined the patient. The following aspects are pertinent.  Past Medical History:  Diagnosis Date   Anemia    as a child   Anginal pain (HCC)    Asthma    as a child   Bladder incontinence    Chronic back pain    spinal stenosis and buldging disc;scoliosis   Chronic constipation    occasionally takes something OTC   Chronic kidney disease    Diabetes mellitus without complication (HCC)    per pt "Pre" for 20+yrs    Diverticulosis    DVT (deep venous thrombosis) (HCC)    Dyspnea    occasional   Gallstones    has known about this for 3-70yrs   GERD (gastroesophageal reflux disease)    takes Omeprazole daily   Glaucoma    H/O hiatal hernia    Heart murmur    Hemorrhoids    History of blood transfusion    no abnormal reaction noted   History of bronchitis    states its been a long time ago   History of gout    HLD (hyperlipidemia)    takes Fenofibrate daily   HTN (hypertension)    takes Diltiazem and Ramipril daily   Hypothyroidism    takes Synthroid daily   Left knee DJD    Macular degeneration    Nocturia    Palpitations    started in 2013 and only occasionally;last time noticed about 2-3wks ago and after drinking caffeine   Peripheral vascular disease (HCC)    PONV (postoperative nausea and vomiting)    problems swallowing after anethesia- 2/2023and slurred speech also   Pulmonary embolism with acute cor pulmonale (HCC) 07/2018   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   Sleep apnea    does not uses cpap   Social History   Socioeconomic History   Marital status: Single    Spouse name: Not on file   Number of children: Not on file   Years of education: Not on file   Highest education level: Not on file  Occupational History   Occupation: Retired  Tobacco Use   Smoking status: Former    Current packs/day: 0.00    Average packs/day: 0.5 packs/day for 29.2 years (14.6 ttl pk-yrs)    Types: Cigarettes    Start date: 09/04/1964    Quit date: 11/25/1993    Years since quitting: 29.7   Smokeless tobacco: Never   Tobacco comments:    quit smoking in 1995  Vaping Use   Vaping status: Never Used  Substance and Sexual Activity   Alcohol use: Not Currently    Comment: Socially.   Drug use: No   Sexual activity: Not Currently    Birth control/protection: Surgical  Other Topics Concern   Not on file  Social  History Narrative   Pt lives w/ sister.   Social Determinants of Health   Financial Resource Strain: Not on file  Food Insecurity: No Food Insecurity (07/05/2023)   Hunger Vital Sign    Worried About Running Out of Food in the Last Year: Never true    Ran Out of Food in the Last Year: Never true  Transportation Needs: No Transportation Needs (07/26/2023)   PRAPARE - Transportation  Lack of Transportation (Medical): No    Lack of Transportation (Non-Medical): No  Physical Activity: Not on file  Stress: Not on file  Social Connections: Not on file   Family History  Problem Relation Age of Onset   Uterine cancer Mother    Heart disease Mother    Hypertension Mother    Diabetes Mother    Cirrhosis Father    Pneumonia Father    Lymphoma Sister        ,non hodgkin    Diabetes Sister    Breast cancer Neg Hx    Scheduled Meds:  carvedilol  6.25 mg Oral Daily   Chlorhexidine Gluconate Cloth  6 each Topical Daily   enoxaparin (LOVENOX) injection  40 mg Subcutaneous Q24H   levothyroxine  50 mcg Oral QAC breakfast   megestrol  400 mg Oral BID   modafinil  100 mg Oral Daily   pantoprazole  40 mg Oral Daily   polyethylene glycol  17 g Oral Daily   senna-docusate  2 tablet Oral Daily   Continuous Infusions:  amiodarone 150 mg (07/30/23 1607)   dextrose 10 % 1,000 mL with potassium chloride 20 mEq infusion 50 mL/hr at 08/02/23 1734   PRN Meds:.acetaminophen **OR** acetaminophen, amiodarone, metoprolol tartrate, ondansetron **OR** ondansetron (ZOFRAN) IV, traMADol Medications Prior to Admission:  Prior to Admission medications   Medication Sig Start Date End Date Taking? Authorizing Provider  acetaminophen (TYLENOL) 650 MG CR tablet Take 650 mg by mouth every 8 (eight) hours as needed for pain.   Yes [provider]  albuterol (VENTOLIN HFA) 108 (90 Base) MCG/ACT inhaler Inhale 2 puffs into the lungs every 6 (six) hours as needed. 07/02/19  Yes Coral Ceo, NP  benzonatate  (TESSALON) 100 MG capsule Take by mouth 3 (three) times daily.   Yes [provider]  bimatoprost (LUMIGAN) 0.01 % SOLN Place 1 drop into both eyes at bedtime. 02/06/21  Yes [provider]  carvedilol (COREG) 6.25 MG tablet Take 6.25 mg by mouth in the morning.   Yes [provider]  Cholecalciferol (VITAMIN D3 PO) Take 25 mcg by mouth daily.   Yes [provider]  diclofenac Sodium (VOLTAREN) 1 % GEL Apply 2 g topically daily as needed (pain).   Yes [provider]  enoxaparin (LOVENOX) 40 MG/0.4ML injection Inject 0.4 mLs (40 mg total) into the skin daily. 07/16/23  Yes Dorcas Carrow, MD  feeding supplement (ENSURE ENLIVE / ENSURE PLUS) LIQD Take 237 mLs by mouth 2 (two) times daily between meals. Patient taking differently: Take 237 mLs by mouth 3 (three) times daily between meals. 07/16/23  Yes Dorcas Carrow, MD  fenofibrate (TRICOR) 145 MG tablet Take 145 mg by mouth daily.   Yes [provider]  Guaifenesin (MUCINEX MAXIMUM STRENGTH) 1200 MG TB12 Take 1 tablet by mouth in the morning and at bedtime.   Yes [provider]  hydrocortisone cream 1 % Apply 1 Application topically daily as needed for itching.   Yes [provider]  ketorolac (ACULAR) 0.5 % ophthalmic solution Place 1 drop into both eyes in the morning, at noon, and at bedtime. Verified correct strength per Ophthalmalogist progress notes   Yes [provider]  levothyroxine (SYNTHROID, LEVOTHROID) 50 MCG tablet Take 50 mcg by mouth daily before breakfast.   Yes [provider]  lidocaine (LIDODERM) 5 % Place 1 patch onto the skin daily as needed (pain). 04/14/23  Yes [provider]  liver oil-zinc oxide (DESITIN)  40 % ointment Apply 1 Application topically every 12 (twelve) hours as needed for irritation.   Yes [provider]  nystatin (MYCOSTATIN) 100000 UNIT/ML suspension Take 5 mLs by mouth 4 (four) times daily.   Yes  [provider]  omeprazole (PRILOSEC) 40 MG capsule Take 40 mg by mouth daily.   Yes [provider]  ondansetron (ZOFRAN) 4 MG tablet Take 1 tablet (4 mg total) by mouth every 8 (eight) hours as needed for nausea or vomiting. 06/19/23  Yes Janine Ores K, PA-C  polyethylene glycol (MIRALAX / GLYCOLAX) 17 g packet Take 17 g by mouth daily. 07/16/23  Yes Dorcas Carrow, MD  QUEtiapine (SEROQUEL) 25 MG tablet Take 25 mg by mouth at bedtime.   Yes [provider]  rosuvastatin (CRESTOR) 20 MG tablet TAKE 1 TABLET BY MOUTH  DAILY AT 6 PM. Patient taking differently: Take 20 mg by mouth daily. 07/08/22  Yes Marykay Lex, MD  sennosides-docusate sodium (SENOKOT-S) 8.6-50 MG tablet Take 2 tablets by mouth daily. 06/19/23  Yes Janine Ores K, PA-C  traMADol (ULTRAM) 50 MG tablet Take 1 tablet (50 mg total) by mouth every 4 (four) hours as needed for moderate pain or severe pain. Patient taking differently: Take 50 mg by mouth in the morning, at noon, and at bedtime. 14 Days 07/16/23  Yes Dorcas Carrow, MD   Allergies  Allergen Reactions   Atorvastatin Other (See Comments)    Muscle pain   Codeine Nausea And Vomiting   Morphine And Codeine     B/p drops    Review of Systems Not noted to be in distress Physical Exam Not noted to be in distress however does appear with generalized weakness Sitting up in bed Attempts to respond and interact with nursing staff present in the room  Vital Signs: BP (!) 160/98 (BP Location: Right Arm)   Pulse (!) 110   Temp 98.7 F (37.1 C) (Oral)   Resp 20   Ht 5\' 1"  (1.549 m)   Wt 87.7 kg   SpO2 99%   BMI 36.53 kg/m  Pain Scale: 0-10   Pain Score: 0-No pain   SpO2: SpO2: 99 % O2 Device:SpO2: 99 % O2 Flow Rate: .   IO: Intake/output summary:  Intake/Output Summary (Last 24 hours) at 08/03/2023 1018 Last data filed at 08/03/2023 0945 Gross per 24 hour  Intake 137.48 ml  Output 750 ml  Net -612.52 ml    LBM: Last BM Date  : 07/29/23 Baseline Weight: Weight: 78.9 kg Most recent weight: Weight: 87.7 kg     Palliative Assessment/Data:   Palliative performance scale 40%.  Time In:   9.10 Time Out:   10.10 Time Total: 60 Greater than 50%  of this time was spent counseling and coordinating care related to the above assessment and plan.  Signed by: Rosalin Hawking, MD   Please contact Palliative Medicine Team phone at [phone] for questions and concerns.  For individual provider: See Loretha Stapler

## 2023-08-03 NOTE — Plan of Care (Signed)
  Problem: Clinical Measurements: Goal: Will remain free from infection Outcome: Progressing   Problem: Nutrition: Goal: Adequate nutrition will be maintained Outcome: Not Progressing   Problem: Elimination: Goal: Will not experience complications related to bowel motility Outcome: Progressing

## 2023-08-03 NOTE — Plan of Care (Signed)
  Problem: Education: Goal: Knowledge of General Education information will improve Description: Including pain rating scale, medication(s)/side effects and non-pharmacologic comfort measures Outcome: Progressing   Problem: Health Behavior/Discharge Planning: Goal: Ability to manage health-related needs will improve Outcome: Not Progressing   Problem: Clinical Measurements: Goal: Ability to maintain clinical measurements within normal limits will improve Outcome: Progressing Goal: Will remain free from infection Outcome: Progressing   Problem: Clinical Measurements: Goal: Will remain free from infection Outcome: Progressing

## 2023-08-03 NOTE — TOC Progression Note (Addendum)
Transition of Care [facility]) - Progression Note    Patient Details  Name: Tricia Harrison MRN: [id number] Date of Birth: [birth year]  Transition of Care [facility]) CM/SW Contact  Darleene Cleaver, [place] Phone Number: 08/03/2023, 10:27 AM  Clinical Narrative:     CSW received referral for patient and family wanting home with hospice.  CSW spoke to patient's son Caryn Bee [phone] and they would like to use [facility] for home hospice.  Per son Caryn Bee, he is asking [facility] to speak to his wife Rollene Fare, [phone] to discuss equipment delivery and coordination.  Per patient's son, they will have to move furniture out of the room to accept the hospital bed, hoyer lift, and wheelchair.    CSW attempted to call Watt Climes from [facility] at [phone], left a message on voice mail.  CSW then sent her a secure chat, and she acknowledged receipt of message.  Watt Climes will contact patient's family to discuss [facility] hospice services and coordinate DME delivery.    Patient will be returning to her home  [address] [name]  [place] [address]    Expected Discharge Plan: Skilled Nursing Facility Barriers to Discharge: Continued Medical Work up  Expected Discharge Plan and Services  Home with Hospice through [facility].   Post Acute Care Choice: Skilled Nursing Facility Living arrangements for the past 2 months: Skilled Nursing Facility                                       Social Determinants of Health (SDOH) Interventions SDOH Screenings   Food Insecurity: No Food Insecurity (07/05/2023)  Housing: Patient Unable To Answer (07/05/2023)  Transportation Needs: No Transportation Needs (07/26/2023)  Utilities: Not At Risk (07/26/2023)  Depression (PHQ2-9): Low Risk  (11/09/2018)  Tobacco Use: Medium Risk (07/24/2023)    Readmission Risk Interventions    07/08/2023    4:14 PM  Readmission Risk Prevention Plan  Transportation Screening Complete  PCP or Specialist Appt  within 5-7 Days Complete  Home Care Screening Complete  Medication Review (RN CM) Complete

## 2023-08-03 NOTE — Progress Notes (Signed)
PROGRESS NOTE    Tricia Harrison  HYQ:[id number] DOB: 14-Jul-1946 DOA: 07/24/2023 PCP: Darrow Bussing, MD   Brief Narrative:  This 77 years old female with PMH significant for morbid obesity, type 2 diabetes, history of PE, CKD stage IIIb with baseline creatinine 1.0, hypertension, recent chronic Foley catheter placement due to urinary retention, chronic edema due to hypoalbuminemia, chronic anemia from chronic kidney disease, chronic back pain who was discharged to [facility] nursing home on 07/16/2023.  She presented back from the facility due to reported confusion.  Patient has no history of dementia.  Patient was reportedly diagnosed with COVID yesterday at the facility.  UA also positive for leukocyte Estrace many bacteria. Ammonia level is less than 10. CT head negative for acute abnormality.  Patient blood sugar was also found to be low 62.  Patient is admitted for further evaluation.  Patient transitioning to hospice care per discussion with family and hospice team.  Apparently family is unable to prepare the house until 08/05/2023, will discuss with case management and care team on coordinating discharge with family.  Otherwise patient remained stable, critically ill, high risk for decompensation in the next 7 to 10 days.  Assessment & Plan:   Principal Problem:   COVID-19 virus infection Active Problems:   Acute metabolic encephalopathy   Edema due to hypoalbuminemia   Diabetic hypoglycemia (HCC)   Catheter-associated urinary tract infection (HCC)   Essential hypertension   Hypothyroidism   Morbid obesity (HCC)   Type 2 diabetes mellitus (HCC)   History of pulmonary embolism   Obstructive sleep apnea   Chronic kidney disease, stage 3b (HCC)   Chronic constipation    Failure to thrive, goals of care Appreciate palliative care insight and recommendations - likely discharge to hospice at home 08/05/2023  COVID-19 infection: Without fever, hypoxia, no indication for ongoing  treatment  Catheter-associated urinary tract infection (HCC), Enterobacter, resolved Completed cefepime 07/30/2023 -Family requesting repeat UTI workup, repeat UA pending   Diabetes, uncontrolled with hypoglycemia (HCC) Possibly exacerbating encephalopathy, although mental status not improving despite correction of hypoglycemia Continue D10 until p.o. intake is more appropriate   Failure to thrive/moderate to severe protein caloric malnutrition  Concurrent edema due to hypoalbuminemia: Pt with low total protein and albumin.  Continue to advance diet as tolerated, currently patient is refusing any further p.o. intake even with family at bedside   Acute metabolic encephalopathy, stable, ongoing: -Likely multifactorial in the setting of covid, hypoglycemia, UTI. -CT head negative for acute abnormality. -Despite completion of treatment patient remains altered and poorly interactive despite modafinil   Chronic constipation -Continue senna and miralax.  -Hx of hemorrhoids with rectal bleeding in the past when she gets constipated.   Chronic kidney disease, stage 3b (HCC): -Chronic. Baseline Scr 1.0. -Avoid nephrotoxic medications. Continue hydration.   Obstructive sleep apnea: -Continue CPAP prn   History of pulmonary embolism: -On DVT prophylaxis with 40 mg SQ lovenox.  -Per last discharge summary, pt intolerant to restarting Eliquis due to recurrent hematuria.   Type 2 diabetes mellitus (HCC): Hold DM meds due to hypoglycemia.  Hypothyroidism: Stable. Continue synthroid 50 mcg   Essential hypertension: Sinus bradycardia, stable Dysrhythmia/torsades; transient, resolved - Transient tachycardia with torsades noted 07/30/23, asymptomatic without hypotension -continue as needed rate control medications - Likely complication of profound/ongoing poor p.o. intake and electrolyte abnormalities Continue Coreg 6.25 mg qday.  Obesity: Diet and exercise discussed in detail Estimated  body mass index is 36.53 kg/m as calculated from the following:  Height as of this encounter: 5\' 1"  (1.549 m).   Weight as of this encounter: 87.7 kg.    DVT prophylaxis: enoxaparin (LOVENOX) injection 40 mg Start: 07/25/23 1000 SCDs Start: 07/25/23 0431 Place TED hose Start: 07/25/23 0431 Code Status:   Code Status: Limited: Do not attempt resuscitation (DNR) -DNR-LIMITED -Do Not Intubate/DNI  Family Communication: Son, daughter-in-law, and sister updated Disposition Plan: Inpatient   Consultants:  None  Procedures: None  Antimicrobials:  Anti-infectives (From admission, onward)    Start     Dose/Rate Route Frequency Ordered Stop   07/27/23 1700  ceFEPIme (MAXIPIME) 2 g in sodium chloride 0.9 % 100 mL IVPB        2 g 200 mL/hr over 30 Minutes Intravenous Every 12 hours 07/27/23 1523 07/30/23 1841   07/25/23 2300  cefTRIAXone (ROCEPHIN) 1 g in sodium chloride 0.9 % 100 mL IVPB  Status:  Discontinued        1 g 200 mL/hr over 30 Minutes Intravenous Every 24 hours 07/25/23 0430 07/27/23 1523   07/24/23 2145  cefTRIAXone (ROCEPHIN) 1 g in sodium chloride 0.9 % 100 mL IVPB        1 g 200 mL/hr over 30 Minutes Intravenous  Once 07/24/23 2143 07/24/23 2330      Subjective: No acute issues/events reported overnight -patient poorly responsive again today, review of systems markedly limited   Objective: Vitals:   08/02/23 0543 08/02/23 1435 08/02/23 1944 08/03/23 0612  BP: 126/80 (!) 141/79 (!) 148/90 (!) 160/98  Pulse: (!) 105 (!) 112 79 (!) 110  Resp: 16 16  20   Temp: 98.3 F (36.8 C) 100.3 F (37.9 C) 98.1 F (36.7 C) 98.7 F (37.1 C)  TempSrc: Oral Oral Oral Oral  SpO2: 100% 100% 100% 99%  Weight:      Height:        Intake/Output Summary (Last 24 hours) at 08/03/2023 0730 Last data filed at 08/02/2023 1838 Gross per 24 hour  Intake 0 ml  Output 300 ml  Net -300 ml   Filed Weights   07/30/23 0500 07/31/23 0700 08/01/23 0631  Weight: 87 kg 90.3 kg 87.7 kg     Examination:  General:  Pleasantly resting in bed, No acute distress.  Poorly interactive, will not follow commands or interact in any meaningful way HEENT: Blinks to threat, will not follow or track movement Neck:  Without mass or deformity.  Trachea is midline. Lungs:  Clear to auscultate bilaterally without rhonchi, wheeze, or rales. Heart:  Regular rate and rhythm.  Without murmurs, rubs, or gallops. Abdomen:  Soft, nontender, nondistended.  Without guarding or rebound. Extremities: Without cyanosis, clubbing, edema, or obvious deformity. Noted resting tremor of L foot, minimally noted in R toes and L fingers - resolve with palpation Skin:  Warm and dry, no erythema.   Data Reviewed: I have personally reviewed following labs and imaging studies  CBC: Recent Labs  Lab 07/30/23 0535 08/01/23 0551  WBC 3.4* 4.0  HGB 9.2* 9.6*  HCT 28.8* 29.7*  MCV 96.0 94.3  PLT 238 258   Basic Metabolic Panel: Recent Labs  Lab 07/30/23 0535 08/01/23 0551  NA 128* 133*  K 4.4 4.3  CL 102 107  CO2 20* 19*  GLUCOSE 90 93  BUN 14 12  CREATININE 0.92 0.87  CALCIUM 6.9* 7.2*  MG 1.9  --   PHOS 2.3*  --    GFR: Estimated Creatinine Clearance: 54.5 mL/min (by C-G formula based on SCr of 0.87  mg/dL).  Radiology Studies: No results found.  Scheduled Meds:  carvedilol  6.25 mg Oral Daily   Chlorhexidine Gluconate Cloth  6 each Topical Daily   enoxaparin (LOVENOX) injection  40 mg Subcutaneous Q24H   levothyroxine  50 mcg Oral QAC breakfast   megestrol  400 mg Oral BID   modafinil  100 mg Oral Daily   pantoprazole  40 mg Oral Daily   polyethylene glycol  17 g Oral Daily   senna-docusate  2 tablet Oral Daily   Continuous Infusions:  amiodarone 150 mg (07/30/23 1607)   dextrose 10 % 1,000 mL with potassium chloride 20 mEq infusion 50 mL/hr at 08/02/23 1734    LOS: 9 days   Time spent: 25 mins  Azucena Fallen, DO [facility]   If 7PM-7AM, please contact  night-coverage

## 2023-08-03 NOTE — Progress Notes (Signed)
Civil engineer, contracting [facility]) Hospital Liaison Note  Received request from Transitions of Care Manager, Minerva Areola, for hospice services at home after discharge. Chart and patient information under review by [facility] physician.    Spoke with DIL/Rosalyn & son/Kevin to initiate education related to hospice philosophy, services, and team approach to care. Both verbalized understanding of information given. Per discussion, the plan is for patient to discharge home via [facility] once cleared to DC.    DME needs discussed. Patient has the following equipment in the home N/a Patient requests the following equipment for delivery: [facility] Lift Wheelchair BST AV RN, please provide chux & incontinence products   Address verified and is correct in the chart. Geri Seminole is the family member to contact to arrange time of equipment delivery.    Please send signed and completed DNR home with patient/family. Please provide prescriptions at discharge as needed to ensure ongoing symptom management.    [facility] information and contact numbers given to family & above information shared with TOC.   Please call with any questions/concerns.    Thank you for the opportunity to participate in this patient's care.   Eugenie Birks, MSW [facility] Liaison  [phone]

## 2023-08-04 DIAGNOSIS — U071 COVID-19: Secondary | ICD-10-CM | POA: Diagnosis not present

## 2023-08-04 LAB — GLUCOSE, CAPILLARY
Glucose-Capillary: 105 mg/dL — ABNORMAL HIGH (ref 70–99)
Glucose-Capillary: 165 mg/dL — ABNORMAL HIGH (ref 70–99)
Glucose-Capillary: 52 mg/dL — ABNORMAL LOW (ref 70–99)
Glucose-Capillary: 64 mg/dL — ABNORMAL LOW (ref 70–99)
Glucose-Capillary: 68 mg/dL — ABNORMAL LOW (ref 70–99)
Glucose-Capillary: 78 mg/dL (ref 70–99)
Glucose-Capillary: 86 mg/dL (ref 70–99)

## 2023-08-04 MED ORDER — DEXTROSE 50 % IV SOLN
INTRAVENOUS | Status: AC
  Filled 2023-08-04: qty 50

## 2023-08-04 MED ORDER — DEXTROSE 50 % IV SOLN
1.0000 | Freq: Once | INTRAVENOUS | Status: AC
  Administered 2023-08-04: 50 mL via INTRAVENOUS

## 2023-08-04 NOTE — Plan of Care (Signed)
  Problem: Clinical Measurements: Goal: Will remain free from infection Outcome: Progressing Goal: Cardiovascular complication will be avoided Outcome: Progressing   

## 2023-08-04 NOTE — Progress Notes (Signed)
PROGRESS NOTE    Tricia Harrison  GEX:[id number] DOB: 11-Nov-1946 DOA: 07/24/2023 PCP: Darrow Bussing, MD   Brief Narrative:  This 77 years old female with PMH significant for morbid obesity, type 2 diabetes, history of PE, CKD stage IIIb with baseline creatinine 1.0, hypertension, recent chronic Foley catheter placement due to urinary retention, chronic edema due to hypoalbuminemia, chronic anemia from chronic kidney disease, chronic back pain who was discharged to [facility] nursing home on 07/16/2023.  She presented back from the facility due to reported confusion.  Patient has no history of dementia.  Patient was reportedly diagnosed with COVID yesterday at the facility.  UA also positive for leukocyte Estrace many bacteria. Ammonia level is less than 10. CT head negative for acute abnormality.  Patient blood sugar was also found to be low 62.  Patient is admitted for further evaluation.  Patient transitioning to hospice care per discussion with family and hospice team.  Family is unable to prepare the house until 08/05/2023, will discuss with case management and care team on coordinating discharge with family.  Otherwise patient remained stable, critically ill, high risk for decompensation in the next 7 to 10 days.  Family attempting to arrange around-the-clock home aides (which they understand will be paid for privately) -as well as awaiting equipment delivery prior to discharge.  Current recommendations include: BSC Bed Environmental health practitioner BST  Assessment & Plan:   Principal Problem:   COVID-19 virus infection Active Problems:   Acute metabolic encephalopathy   Edema due to hypoalbuminemia   Diabetic hypoglycemia (HCC)   Catheter-associated urinary tract infection (HCC)   Essential hypertension   Hypothyroidism   Morbid obesity (HCC)   Type 2 diabetes mellitus (HCC)   History of pulmonary embolism   Obstructive sleep apnea   Chronic kidney disease, stage 3b (HCC)    Chronic constipation    Failure to thrive, goals of care -Appreciate palliative care insight and recommendations - likely discharge to hospice at home 08/05/2023  Acute metabolic encephalopathy, somewhat improving: -Likely multifactorial in the setting of covid, hypoglycemia, UTI. -CT head negative for acute abnormality. -Patient's mental status appears to be at least more alert and awake over the past 24 hours, her orientation remains limited, we will discontinue modafinil given concern for sundowning/hospital delirium now that her acute illnesses have recovered as above.   COVID-19 infection, resolved: -Without fever, hypoxia, no indication for ongoing treatment  Catheter-associated urinary tract infection (HCC), Enterobacter, resolved -Completed cefepime 07/30/2023 -Family requesting repeat UTI workup, repeat UA inconsistent with infection, no acute complaints from patient   Diabetes, uncontrolled with hypoglycemia (HCC) -Resolved -Continue D10 until p.o. intake is more appropriate -discontinue at discharge/if p.o. intake improves   Failure to thrive/moderate to severe protein caloric malnutrition  Concurrent edema due to hypoalbuminemia: -Pt with low total protein and albumin -Continue to advance diet as tolerated   Chronic constipation -Continue senna and miralax.  -Hx of hemorrhoids with rectal bleeding in the past when she gets constipated.   Chronic kidney disease, stage 3b (HCC): -Chronic. Baseline Scr 1.0. -Avoid nephrotoxic medications. Continue hydration.   Obstructive sleep apnea: -Continue CPAP prn   History of pulmonary embolism: -On DVT prophylaxis with 40 mg SQ lovenox.  -Per last discharge summary, pt intolerant to restarting Eliquis due to recurrent hematuria -we do not plan on reinitiating anticoagulation at discharge   Type 2 diabetes mellitus (HCC): -Hold DM meds due to hypoglycemia.  Hypothyroidism: Stable. Continue synthroid 50 mcg   Essential  hypertension:  Sinus bradycardia, stable Dysrhythmia/torsades; transient, resolved - Transient tachycardia with torsades noted 07/30/23, asymptomatic without hypotension -continue as needed rate control medications - Likely complication of profound/ongoing poor p.o. intake and electrolyte abnormalities Continue Coreg 6.25 mg qday as able to take p.o.  Obesity: Diet and exercise discussed in detail Estimated body mass index is 36.45 kg/m as calculated from the following:   Height as of this encounter: 5\' 1"  (1.549 m).   Weight as of this encounter: 87.5 kg.   Pressure injury, POA Active Pressure Injury/Wound(s)     Pressure Ulcer  Duration          Pressure Injury 07/10/23 Sacrum Right Stage 2 -  Partial thickness loss of dermis presenting as a shallow open injury with a red, pink wound bed without slough. 24 days            DVT prophylaxis: enoxaparin (LOVENOX) injection 40 mg Start: 07/25/23 1000 SCDs Start: 07/25/23 0431 Place TED hose Start: 07/25/23 0431 Code Status:   Code Status: Limited: Do not attempt resuscitation (DNR) -DNR-LIMITED -Do Not Intubate/DNI  Family Communication: Son, daughter-in-law updated over the phone Disposition Plan: Inpatient   Consultants:  None  Procedures: None  Antimicrobials:  Anti-infectives (From admission, onward)    Start     Dose/Rate Route Frequency Ordered Stop   07/27/23 1700  ceFEPIme (MAXIPIME) 2 g in sodium chloride 0.9 % 100 mL IVPB        2 g 200 mL/hr over 30 Minutes Intravenous Every 12 hours 07/27/23 1523 07/30/23 1841   07/25/23 2300  cefTRIAXone (ROCEPHIN) 1 g in sodium chloride 0.9 % 100 mL IVPB  Status:  Discontinued        1 g 200 mL/hr over 30 Minutes Intravenous Every 24 hours 07/25/23 0430 07/27/23 1523   07/24/23 2145  cefTRIAXone (ROCEPHIN) 1 g in sodium chloride 0.9 % 100 mL IVPB        1 g 200 mL/hr over 30 Minutes Intravenous  Once 07/24/23 2143 07/24/23 2330      Subjective: No acute issues/events  reported overnight -patient more awake and alert today, review of systems remains difficult to obtain due to mental status but denies any urinary symptoms reports pain "all over" with no specific PMI   Objective: Vitals:   08/03/23 1740 08/03/23 2048 08/04/23 0606 08/04/23 0609  BP:  (!) 140/101 (!) 139/104   Pulse:  86 (!) 109   Resp: (!) 22 16 16    Temp:  98.2 F (36.8 C) (!) 97.5 F (36.4 C)   TempSrc:      SpO2:  99% 96%   Weight:    87.5 kg  Height:        Intake/Output Summary (Last 24 hours) at 08/04/2023 0854 Last data filed at 08/04/2023 2841 Gross per 24 hour  Intake 549.55 ml  Output 850 ml  Net -300.45 ml   Filed Weights   07/31/23 0700 08/01/23 0631 08/04/23 0609  Weight: 90.3 kg 87.7 kg 87.5 kg    Examination:  General:  Pleasantly resting in bed, No acute distress.  Awake alert oriented to self only HEENT: Blinks to threat, will not follow or track movement Neck:  Without mass or deformity.  Trachea is midline. Lungs:  Clear to auscultate bilaterally without rhonchi, wheeze, or rales. Heart:  Regular rate and rhythm.  Without murmurs, rubs, or gallops. Abdomen:  Soft, nontender, nondistended.  Without guarding or rebound. Extremities: Without cyanosis, clubbing, edema, or obvious deformity.  Skin:  Warm  and dry, no erythema. Sacral pressure injury as above.   Data Reviewed: I have personally reviewed following labs and imaging studies  CBC: Recent Labs  Lab 07/30/23 0535 08/01/23 0551  WBC 3.4* 4.0  HGB 9.2* 9.6*  HCT 28.8* 29.7*  MCV 96.0 94.3  PLT 238 258   Basic Metabolic Panel: Recent Labs  Lab 07/30/23 0535 08/01/23 0551  NA 128* 133*  K 4.4 4.3  CL 102 107  CO2 20* 19*  GLUCOSE 90 93  BUN 14 12  CREATININE 0.92 0.87  CALCIUM 6.9* 7.2*  MG 1.9  --   PHOS 2.3*  --    GFR: Estimated Creatinine Clearance: 54.5 mL/min (by C-G formula based on SCr of 0.87 mg/dL).  Radiology Studies: No results found.  Scheduled Meds:  carvedilol   6.25 mg Oral Daily   Chlorhexidine Gluconate Cloth  6 each Topical Daily   enoxaparin (LOVENOX) injection  40 mg Subcutaneous Q24H   levothyroxine  50 mcg Oral QAC breakfast   megestrol  400 mg Oral BID   modafinil  100 mg Oral Daily   pantoprazole  40 mg Oral Daily   polyethylene glycol  17 g Oral Daily   senna-docusate  2 tablet Oral Daily   Continuous Infusions:  amiodarone 150 mg (07/30/23 1607)   dextrose 10 % 1,000 mL with potassium chloride 20 mEq infusion 50 mL/hr at 08/04/23 0732    LOS: 10 days   Time spent: 25 mins  Azucena Fallen, DO [facility]   If 7PM-7AM, please contact night-coverage

## 2023-08-05 DIAGNOSIS — U071 COVID-19: Secondary | ICD-10-CM | POA: Diagnosis not present

## 2023-08-05 LAB — GLUCOSE, CAPILLARY
Glucose-Capillary: 55 mg/dL — ABNORMAL LOW (ref 70–99)
Glucose-Capillary: 74 mg/dL (ref 70–99)
Glucose-Capillary: 85 mg/dL (ref 70–99)

## 2023-08-05 MED ORDER — MEGESTROL ACETATE 400 MG/10ML PO SUSP: 400.0000 mg | Freq: Two times a day (BID) | ORAL | 0 refills | Status: AC

## 2023-08-05 NOTE — TOC Progression Note (Signed)
Transition of Care [facility]) - Progression Note    Patient Details  Name: Tricia Harrison MRN: [id number] Date of Birth: 1946-05-29  Transition of [facility]) CM/SW Contact  Geni Bers, RN Phone Number: 08/05/2023, 11:45 AM  Clinical Narrative:     Spoke with Caryn Bee pt's son concerning discharge transportation. Caryn Bee states that pt will not discharge until tomorrow, DME was not delivered, hospice RN would not come until tomorrow at 5 pm. Pt will need PTAR for transportation.   Expected Discharge Plan: Skilled Nursing Facility Barriers to Discharge: Continued Medical Work up  Expected Discharge Plan and Services     Post Acute Care Choice: Skilled Nursing Facility Living arrangements for the past 2 months: Skilled Nursing Facility Expected Discharge Date: 08/05/23                                     Social Determinants of Health (SDOH) Interventions SDOH Screenings   Food Insecurity: No Food Insecurity (07/05/2023)  Housing: Patient Unable To Answer (07/05/2023)  Transportation Needs: No Transportation Needs (07/26/2023)  Utilities: Not At Risk (07/26/2023)  Depression (PHQ2-9): Low Risk  (11/09/2018)  Tobacco Use: Medium Risk (07/24/2023)    Readmission Risk Interventions    07/08/2023    4:14 PM  Readmission Risk Prevention Plan  Transportation Screening Complete  PCP or Specialist Appt within 5-7 Days Complete  Home Care Screening Complete  Medication Review (RN CM) Complete

## 2023-08-05 NOTE — Care Management Important Message (Signed)
Important Message  Patient Details No IM Letter given due to Discharging with Hospice. Name: Corah Wurzel MRN: [id number] Date of Birth: [birth year]   Medicare Important Message Given:  No     [name] 08/05/2023, 12:39 PM

## 2023-08-05 NOTE — TOC Progression Note (Signed)
Transition of Care [facility]) - Progression Note    Patient Details  Name: Tricia Harrison MRN: [id number] Date of Birth: July 31, 1946  [facility]) CM/SW Contact  Armanda Heritage, RN Phone Number: 08/05/2023, 4:04 PM  Clinical Narrative:    CM spoke with son and daughter in law, family reports equipment will be in the home this evening and they are finalizing preparations for patients return, however cannot safely accept her back today.  Plan at this time will be to transport patient home tomorrow morning via PTAR.  MD aware.   Expected Discharge Plan: Skilled Nursing Facility Barriers to Discharge: Continued Medical Work up  Expected Discharge Plan and Services     Post Acute Care Choice: Skilled Nursing Facility Living arrangements for the past 2 months: Skilled Nursing Facility Expected Discharge Date: 08/05/23                                     Social Determinants of Health (SDOH) Interventions SDOH Screenings   Food Insecurity: No Food Insecurity (07/05/2023)  Housing: Patient Unable To Answer (07/05/2023)  Transportation Needs: No Transportation Needs (07/26/2023)  Utilities: Not At Risk (07/26/2023)  Depression (PHQ2-9): Low Risk  (11/09/2018)  Tobacco Use: Medium Risk (07/24/2023)    Readmission Risk Interventions    07/08/2023    4:14 PM  Readmission Risk Prevention Plan  Transportation Screening Complete  PCP or Specialist Appt within 5-7 Days Complete  Home Care Screening Complete  Medication Review (RN CM) Complete

## 2023-08-05 NOTE — Discharge Summary (Signed)
Physician Discharge Summary  Tricia Harrison EPP:[id number] DOB: 12-29-1945 DOA: 07/24/2023  PCP: Darrow Bussing, MD  Admit date: 07/24/2023 Discharge date: 08/05/2023  Admitted From: Home Disposition: Home with hospice  Equipment/Devices: [facility] Bed [employer] Wheelchair BST  Discharge Condition: Poor CODE STATUS: DNR Diet recommendation: As tolerated  Brief/Interim Summary: This 77 years old female with PMH significant for morbid obesity, type 2 diabetes, history of PE, CKD stage IIIb with baseline creatinine 1.0, hypertension, recent chronic Foley catheter placement due to urinary retention, chronic edema due to hypoalbuminemia, 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  She presented back from the facility due to reported confusion.  Patient has no history of dementia.  Patient was reportedly diagnosed with COVID yesterday at the facility.  UA also positive for leukocyte Estrace many bacteria. Ammonia level is less than 10. CT head negative for acute abnormality.  Patient blood sugar was also found to be low 62.  Patient is admitted for further evaluation. Patient transitioning to hospice care per discussion with family and hospice team. Family is unable to prepare the house until 08/05/2023, will discuss with case management and care team on coordinating discharge with family.  Otherwise patient remained stable, critically ill, high risk for decompensation in the next 7 to 10 days.  Patient remains medically stable for discharge, plan for discharge home with hospice on 08/05/2023 per prior discussion with hospital team and family.  Family was to have house ready for equipment delivery and home hospice to be set up for discharge on 08/05/2023.  Discharge Diagnoses:  Principal Problem:   COVID-19 virus infection Active Problems:   Acute metabolic encephalopathy   Edema due to hypoalbuminemia   Diabetic  hypoglycemia (HCC)   Catheter-associated urinary tract infection (HCC)   Essential hypertension   Hypothyroidism   Morbid obesity (HCC)   Type 2 diabetes mellitus (HCC)   History of pulmonary embolism   Obstructive sleep apnea   Chronic kidney disease, stage 3b (HCC)   Chronic constipation  Failure to thrive, goals of care -Appreciate palliative care insight and recommendations - discharge to hospice at home 08/05/2023   Acute metabolic encephalopathy, somewhat improving: -Likely multifactorial in the setting of covid, hypoglycemia, UTI. -CT head negative for acute abnormality. -Improving, discontinue modafinil.   COVID-19 infection, resolved: -Without fever, hypoxia, no indication for ongoing treatment   Catheter-associated urinary tract infection (HCC), POA, Enterobacter, resolved -Completed cefepime 07/30/2023 -Family requesting repeat UTI workup, repeat UA inconsistent with infection, no acute complaints from patient   Diabetes, uncontrolled with hypoglycemia (HCC) -Resolved -Continue D10 until p.o. intake is more appropriate -discontinue at discharge/if p.o. intake improves   Failure to thrive/moderate to severe protein caloric malnutrition  Concurrent edema due to hypoalbuminemia: -Pt with low total protein and albumin -Continue to advance diet as tolerated   Chronic constipation -Continue senna and miralax.  -Hx of hemorrhoids with rectal bleeding in the past when she gets constipated.   Chronic kidney disease, stage 3b (HCC): -Chronic. Baseline Scr 1.0. -Avoid nephrotoxic medications. Continue hydration.   Obstructive sleep apnea: -Continue CPAP prn   History of pulmonary embolism: -On DVT prophylaxis with 40 mg SQ lovenox.  -Per last discharge summary, pt intolerant to restarting Eliquis due to recurrent hematuria -we do not plan on reinitiating anticoagulation at discharge   Type 2 diabetes mellitus (HCC): -Hold DM meds due to hypoglycemia.    Hypothyroidism: Stable. Continue synthroid 50 mcg   Essential  hypertension: Sinus bradycardia, stable Dysrhythmia/torsades; transient, resolved - Transient tachycardia with torsades noted 07/30/23, asymptomatic without hypotension -continue as needed rate control medications - Likely complication of profound/ongoing poor p.o. intake and electrolyte abnormalities Continue Coreg 6.25 mg qday as able to take p.o.   Obesity: Diet and exercise discussed in detail Estimated body mass index is 36.45 kg/m as calculated from the following:   Height as of this encounter: 5\' 1"  (1.549 m).   Weight as of this encounter: 87.5 kg.    Pressure injury, POA Pressure Injury 07/10/23 Sacrum Right Stage 2 -  Partial thickness loss of dermis presenting as a shallow open injury with a red, pink wound bed without slough. (Active)  07/10/23 2228  Location: Sacrum  Location Orientation: Right  Staging: Stage 2 -  Partial thickness loss of dermis presenting as a shallow open injury with a red, pink wound bed without slough.  Wound Description (Comments):   Present on Admission:       Discharge Instructions  Discharge Instructions     Discharge patient   Complete by: As directed    Discharge disposition: 50-Hospice/Home   Discharge patient date: 08/05/2023      Allergies as of 08/05/2023       Reactions   Atorvastatin Other (See Comments)   Muscle pain   Codeine Nausea And Vomiting   Morphine And Codeine    B/p drops         Medication List     STOP taking these medications    benzonatate 100 MG capsule Commonly known as: TESSALON   enoxaparin 40 MG/0.4ML injection Commonly known as: LOVENOX   fenofibrate 145 MG tablet Commonly known as: TRICOR   Mucinex Maximum Strength 1200 MG Tb12 Generic drug: Guaifenesin   rosuvastatin 20 MG tablet Commonly known as: CRESTOR   VITAMIN D3 PO       TAKE these medications    acetaminophen 650 MG CR tablet Commonly known as:  TYLENOL Take 650 mg by mouth every 8 (eight) hours as needed for pain.   albuterol 108 (90 Base) MCG/ACT inhaler Commonly known as: VENTOLIN HFA Inhale 2 puffs into the lungs every 6 (six) hours as needed.   carvedilol 6.25 MG tablet Commonly known as: COREG Take 6.25 mg by mouth in the morning.   feeding supplement Liqd Take 237 mLs by mouth 2 (two) times daily between meals. What changed: when to take this   hydrocortisone cream 1 % Apply 1 Application topically daily as needed for itching.   ketorolac 0.5 % ophthalmic solution Commonly known as: ACULAR Place 1 drop into both eyes in the morning, at noon, and at bedtime. Verified correct strength per Ophthalmalogist progress notes   levothyroxine 50 MCG tablet Commonly known as: SYNTHROID Take 50 mcg by mouth daily before breakfast.   lidocaine 5 % Commonly known as: LIDODERM Place 1 patch onto the skin daily as needed (pain).   liver oil-zinc oxide 40 % ointment Commonly known as: DESITIN Apply 1 Application topically every 12 (twelve) hours as needed for irritation.   Lumigan 0.01 % Soln Generic drug: bimatoprost Place 1 drop into both eyes at bedtime.   megestrol 400 MG/10ML suspension Commonly known as: MEGACE Take 10 mLs (400 mg total) by mouth 2 (two) times daily.   nystatin 100000 UNIT/ML suspension Commonly known as: MYCOSTATIN Take 5 mLs by mouth 4 (four) times daily.   omeprazole 40 MG capsule Commonly known as: PRILOSEC Take 40 mg by mouth daily.  ondansetron 4 MG tablet Commonly known as: Zofran Take 1 tablet (4 mg total) by mouth every 8 (eight) hours as needed for nausea or vomiting.   polyethylene glycol 17 g packet Commonly known as: MIRALAX / GLYCOLAX Take 17 g by mouth daily.   QUEtiapine 25 MG tablet Commonly known as: SEROQUEL Take 25 mg by mouth at bedtime.   sennosides-docusate sodium 8.6-50 MG tablet Commonly known as: SENOKOT-S Take 2 tablets by mouth daily.   traMADol 50 MG  tablet Commonly known as: ULTRAM Take 1 tablet (50 mg total) by mouth every 4 (four) hours as needed for moderate pain or severe pain. What changed:  when to take this additional instructions   Voltaren 1 % Gel Generic drug: diclofenac Sodium Apply 2 g topically daily as needed (pain).        Allergies  Allergen Reactions   Atorvastatin Other (See Comments)    Muscle pain   Codeine Nausea And Vomiting   Morphine And Codeine     B/p drops     Consultations: Palliative care  Procedures/Studies: CT HEAD WO CONTRAST ( )  Result Date: 07/29/2023 CLINICAL DATA:  Initial evaluation for new onset seizure. EXAM: CT HEAD WITHOUT CONTRAST TECHNIQUE: Contiguous axial images were obtained from the base of the skull through the vertex without intravenous contrast. RADIATION DOSE REDUCTION: This exam was performed according to the departmental dose-optimization program which includes automated exposure control, adjustment of the mA and/or kV according to patient size and/or use of iterative reconstruction technique. COMPARISON:  Prior CT from 07/24/2023. FINDINGS: Brain: Mild age-related cerebral atrophy with chronic small vessel ischemic disease. Remote lacunar infarcts noted about the bilateral basal ganglia, stable. No acute intracranial hemorrhage. No acute large vessel territory infarct. No mass lesion or midline shift. No hydrocephalus or extra-axial fluid collection. Vascular: No abnormal hyperdense vessel. Scattered vascular calcifications noted within the carotid siphons. Skull: Scalp soft tissues demonstrate no acute finding. Calvarium intact. Biparietal foramina noted. Sinuses/Orbits: Globes orbital soft tissues within normal limits. Scattered layering secretions and mucosal thickening noted about the sphenoid ethmoidal and maxillary sinuses. No mastoid effusion. Other: None. IMPRESSION: 1. No acute intracranial abnormality. 2. Mild age-related cerebral atrophy with chronic small vessel  ischemic disease, with remote lacunar infarcts about the bilateral basal ganglia, stable. 3. Scattered layering secretions and mucosal thickening about the sphenoid ethmoidal and maxillary sinuses. Clinical correlation for acute sinusitis recommended. Electronically Signed   By: [name] M.D.   On: 07/29/2023 22:50   DG Swallowing Func-Speech Pathology  Result Date: 07/25/2023 Table formatting from the original result was not included. Modified Barium Swallow Study Patient Details Name: Tricia Harrison MRN: [id number] Date of Birth: Oct 09, 1946 Today's Date: 07/25/2023 HPI/PMH: HPI: 77 year old with history of chronic anemia, chronic back pain, chronic constipation, CKD stage IIIb, hypertension and hyperlipidemia, hypothyroidism, dementia, history of pulmonary embolism recently underwent right shoulder replacement, significant postoperative delirium and confusion and was ultimately sent to skilled nursing rehab, now admitted from skilled nursing facility with confusion found to have COVID and diabetic hypoglycemia.  Per MD note she also has a UTI.  Patient has undergone prior modified barium swallow studies x 2 and esophagram.  Most recent MBS was conducted 07/09/2023 and she was noted to have mild oropharyngeal dysphagia with impaired pharyngeal stripping and impaired tongue base retraction resulting in retention.  Requiring several swallows to diminish pharyngeal retention.  In addition prior esophagram shows patulous esophagus, esoph dysmotlity, small hiatal hernia and trace reflux.  Prior BSE with possible  velopharyngeal dysfunction with hypernasality and complaints of nasal regugitation. MBS 01/14/22 without penetration or aspiration but possible decreased UES opening with barium pooled above UES.  Patient chest x-ray showed left more than right infiltrate.  Patient has undergone prior brain imaging and current CT scan was negative for acute change.  She does endorse worsening of her swallowing  in the last 6 months.  Nurse reports she did not pass her Yale swallow screening and that family states patient has a chronic dysphagia. Clinical Impression: Clinical Impression: Pt demonstrates mild oropharyngeal dysphagia consistent with prior MBS.  Her motility deficits do not result in laryngeal penetration or aspiration but allow significant retention.  Oral transiting continues to be impaired - more with increased viscocity.  Velopharyngeal function, pharyngeal stripping wave and tongue base retraction deficits allow pharyngeal retention that pt requires a multitude of swallows to clear (up to 4).  A-P view challenging as pt was leaning to the right significantly.  Various postures including head turn left and chin tuck did not improve oropharyngeal Tricia.    Chopped meats, Dys 3. Continue thin liquids, multiple swallows and alternate liquids and solids.  Did not test a barium tablet - but recommend meds with applesauce - whole if small, consider crushed if large.  SLP questions source of pt's pharyngeal dysphagia - however prior brain imaging showed Mild chronic small vessel ischemic disease. Small lacunar infarcts in the left basal ganglia- ? if this could be the source? Factors that may increase risk of adverse event in presence of aspiration Tricia Harrison & Tricia Harrison 2021): Factors that may increase risk of adverse event in presence of aspiration Tricia Harrison & Tricia Harrison 2021): Reduced saliva; Weak cough Recommendations/Plan: Swallowing Evaluation Recommendations Swallowing Evaluation Recommendations Recommendations: PO diet PO Diet Recommendation: Dysphagia 3 (Mechanical soft); Thin liquids (Level 0) Liquid Administration via: Cup; Straw Medication Administration: Crushed with puree Supervision: Patient able to self-feed; Intermittent supervision/cueing for swallowing strategies Swallowing strategies  : Multiple dry swallows after each bite/sip; Small bites/sips; Slow rate Postural changes: Position pt fully upright  for meals Oral care recommendations: Oral care BID (2x/day) Caregiver Recommendations: Have oral suction available Treatment Plan Treatment Plan Treatment recommendations: Therapy as outlined in treatment plan below Follow-up recommendations: Skilled nursing-short term rehab (<3 hours/day) Functional status assessment: Patient has had a recent decline in their functional status and demonstrates the ability to make significant improvements in function in a reasonable and predictable amount of time. Treatment frequency: Min 1x/week Treatment duration: 2 weeks Interventions: Aspiration precaution training; Patient/family education; Diet toleration management by SLP Recommendations Recommendations for follow up therapy are one component of a multi-disciplinary discharge planning process, led by the attending physician.  Recommendations may be updated based on patient status, additional functional criteria and insurance authorization. Assessment: Orofacial Exam: Orofacial Exam Oral Cavity: Oral Hygiene: Pooled secretions Oral Cavity - Dentition: Adequate natural dentition Oral Motor/Sensory Function: Suspected cranial nerve impairment CN V - Trigeminal: WFL CN VII - Facial: WFL CN IX - Glossopharyngeal, CN X - Vagus: Left motor impairment (Palatal deviation to the right upon phonation) CN XII - Hypoglossal: -- (Generalized weakness) Anatomy: Anatomy: WFL Boluses Administered: Boluses Administered Boluses Administered: Thin liquids (Level 0); Mildly thick liquids (Level 2, nectar thick); Moderately thick liquids (Level 3, honey thick); Puree; Solid  Oral Impairment Domain: Oral Impairment Domain Lip Closure: No labial escape Tongue control during bolus hold: Cohesive bolus between tongue to palatal seal Bolus preparation/mastication: Slow prolonged chewing/mashing with complete recollection Bolus transport/lingual motion: Delayed initiation of tongue motion (  oral holding) Oral residue: Residue collection on oral  structures Location of oral residue : Tongue Initiation of pharyngeal swallow : Valleculae  Pharyngeal Impairment Domain: Pharyngeal Impairment Domain Soft palate elevation: Trace column of contrast or air between SP and PW Laryngeal elevation: Complete superior movement of thyroid cartilage with complete approximation of arytenoids to epiglottic petiole Anterior hyoid excursion: Complete anterior movement Epiglottic movement: Complete inversion Laryngeal vestibule closure: Complete, no air/contrast in laryngeal vestibule Pharyngeal stripping wave : Present - diminished Pharyngeal contraction (A/P view only): -- (difficult view due to pt leaning to the right, thus retention in the right more than left pharynx post-swallow - and pt was tilted posterior impacting adequacy of view) Pharyngoesophageal segment opening: Partial distention/partial duration, partial obstruction of flow Tongue base retraction: Trace column of contrast or air between tongue base and PPW Pharyngeal residue: Collection of residue within or on pharyngeal structures Location of pharyngeal residue: Valleculae; Pharyngeal wall; Pyriform sinuses  Esophageal Impairment Domain: Esophageal Impairment Domain Esophageal Tricia upright position: Complete Tricia, esophageal coating Pill: No data recorded Penetration/Aspiration Scale Score: Penetration/Aspiration Scale Score 1.  Material does not enter airway: Thin liquids (Level 0); Mildly thick liquids (Level 2, nectar thick); Moderately thick liquids (Level 3, honey thick); Solid; Puree Compensatory Strategies: No data recorded  General Information: Caregiver present: No  Diet Prior to this Study: NPO   Temperature : Normal   Respiratory Status: WFL   Supplemental O2: Nasal cannula   History of Recent Intubation: No  Behavior/Cognition: Alert; Cooperative Self-Feeding Abilities: Dependent for feeding Baseline vocal quality/speech: Abnormal resonance Volitional Cough: Able to elicit Volitional  Swallow: Able to elicit Exam Limitations: No limitations Goal Planning: Prognosis for improved oropharyngeal function: Good No data recorded No data recorded Patient/Family Stated Goal: Patient reports problems swallowing more pronounced in the last 6 months Consulted and agree with results and recommendations: Patient Pain: Pain Assessment Faces Pain Scale: 4 Negative Vocalization: 0 Facial Expression: 0 Body Language: 0 Consolability: 0 Pain Location: -- (Her shoulder and bottom) Pain Descriptors / Indicators: Discomfort Pain Intervention(s): Limited activity within patient's tolerance End of Session: Start Time:SLP Start Time (ACUTE ONLY): 1450 Stop Time: SLP Stop Time (ACUTE ONLY): 1525 Time Calculation:SLP Time Calculation (min) (ACUTE ONLY): 35 min Charges: SLP Evaluations $ SLP Speech Visit: 1 Visit SLP Evaluations $BSS Swallow: 1 Procedure $MBS Swallow: 1 Procedure $Swallowing Treatment: 1 Procedure SLP visit diagnosis: SLP Visit Diagnosis: Dysphagia, oropharyngeal phase (R13.12) Past Medical History: Past Medical History: Diagnosis Date  Anemia   as a child  Anginal pain (HCC)   Asthma   as a child  Bladder incontinence   Chronic back pain   spinal stenosis and buldging disc;scoliosis  Chronic constipation   occasionally takes something OTC  Chronic kidney disease   Diabetes mellitus without complication (HCC)   per pt "Pre" for 20+yrs  Diverticulosis   DVT (deep venous thrombosis) (HCC)   Dyspnea   occasional  Gallstones   has known about this for 3-65yrs  GERD (gastroesophageal reflux disease)   takes Omeprazole daily  Glaucoma   H/O hiatal hernia   Heart murmur   Hemorrhoids   History of blood transfusion   no abnormal reaction noted  History of bronchitis   states its been a long time ago  History of gout   HLD (hyperlipidemia)   takes Fenofibrate daily  HTN (hypertension)   takes Diltiazem and Ramipril daily  Hypothyroidism   takes Synthroid daily  Left knee DJD   Macular degeneration  Nocturia    Palpitations   started in 2013 and only occasionally;last time noticed about 2-3wks ago and after drinking caffeine  Peripheral vascular disease (HCC)   PONV (postoperative nausea and vomiting)   problems swallowing after anethesia- 2/2023and slurred speech also  Pulmonary embolism with acute cor pulmonale (HCC) 07/2018  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  Sleep apnea   does not uses cpap Past Surgical History: Past Surgical History: Procedure Laterality Date  3-day EVENT MONITOR  01/2019  Mostly normal monitor.  Predominantly sinus rhythm (rate range 48-105 bpm, average 67 bpm) 9 short atrial runs (fastest = 13 beats rate 135 bpm, longest ~17 seconds-101 bpm) -> not noted on symptom log.  No sustained arrhythmias (tachycardia or bradycardia), or pauses..  Symptoms are noted with PVCs.  bilateral cataract surgery     CHOLECYSTECTOMY N/A 01/10/2022  Procedure: LAPAROSCOPIC CHOLECYSTECTOMY WITH INTRAOPERATIVE CHOLANGIOGRAM;  Surgeon: Karie Soda, MD;  Location: [facility];  Service: General;  Laterality: N/A;  COLONOSCOPY    CT ANGIOGRAM OF CHEST - PE PROTOCOL  07/2018  Bilateral nonocclusive pulmonary emboli evidence of right heart strain consistent with "submassive "/intermediate PE.  -->  Code PE called.  DILATION AND CURETTAGE OF UTERUS    multiple about 6-7 times  ESOPHAGOGASTRODUODENOSCOPY    FLEXIBLE SIGMOIDOSCOPY N/A 07/09/2023  Procedure: FLEXIBLE SIGMOIDOSCOPY;  Surgeon: Charlott Rakes, MD;  Location: [facility];  Service: Gastroenterology;  Laterality: N/A;  NM VQ LUNG SCAN ([facility] HX)  11/23/2018  Low probability for PE  REVERSE SHOULDER ARTHROPLASTY Right 06/17/2023  Procedure: RIGHT REVERSE SHOULDER ARTHROPLASTY;  Surgeon: Teryl Lucy, MD;  Location: [facility];  Service: Orthopedics;  Laterality: Right;  RIGHT/LEFT HEART CATH AND CORONARY ANGIOGRAPHY N/A 08/19/2018  Procedure: RIGHT/LEFT HEART CATH AND CORONARY ANGIOGRAPHY;  Surgeon:  Corky Crafts, MD;  Location: [facility] INVASIVE CV LAB;  Service: Cardiovascular:: Nonobstructive CAD.  Mild pulmonary pretension.  Mean PA pressure 26 mmHg with PA P 48/20 mmHg.  Normal LVEDP.  THYROID SURGERY Right 2010  TOTAL HIP ARTHROPLASTY Left 2005  TOTAL KNEE ARTHROPLASTY Left 02/21/2014  Procedure: TOTAL KNEE ARTHROPLASTY;  Surgeon: Nilda Simmer, MD;  Location: [facility] OR;  Service: Orthopedics;  Laterality: Left;  TRANSTHORACIC ECHOCARDIOGRAM  07/2018   (In setting of acute PE) mild LVH.  EF 55 to 60%.  GR 1 DD.  Aortic sclerosis but no stenosis.  Mild regurgitation.  Mild RA dilation.  Moderate LA dilation.  Moderate tricuspid regurgitation with severe primary hypertension estimated PA pressure 71 mmHg.  RV poorly visualized  TRANSTHORACIC ECHOCARDIOGRAM  10/2018 - 11/2019  a) EF 60-65%.  GR 1 DD.  Focal basal LVH. Mod AI/ no AS. Mild LA dilation.  Normal RV size and fxn.  Mod PI w/ mild TR.  Peak PAP ~ 38 mmHg; b) Normal EF 60 to 65%.  Moderate LVH-GR 1 DD.  R WMA.  Mild AS w/ trivial AI.  Grossly normal PA, RV and RA size.  Normal RV function.  Normal PA pressures.  TRANSTHORACIC ECHOCARDIOGRAM  01/2021   EF 55%.  Normal LV function.  Mild LVH.  GR 1 DD.  Normal PA pressures.  Severe LA dilation.  (Consistent with more significant diastolic dysfunction).  Mild-possibly mild to moderate aortic insufficiency.  Borderline elevated RAP.  Trivial pericardial effusion.  (Stable)  VAGINAL HYSTERECTOMY  1979 Tricia Infante, [facility] Acute Rehab Services Office [id number] Chales Abrahams 07/25/2023, 5:15 PM  CT Head Wo Contrast  Result Date: 07/24/2023 CLINICAL DATA:  Altered level of consciousness, forgetfulness, recent COVID diagnosis EXAM: CT HEAD WITHOUT CONTRAST TECHNIQUE: Contiguous axial images were obtained from the base of the skull through the vertex without intravenous contrast. RADIATION DOSE REDUCTION: This exam was performed according to the departmental dose-optimization program which includes  automated exposure control, adjustment of the mA and/or kV according to patient size and/or use of iterative reconstruction technique. COMPARISON:  01/11/2022 FINDINGS: Brain: Chronic small vessel ischemic changes are seen within the periventricular white matter and bilateral basal ganglia. No acute infarct or hemorrhage. Lateral ventricles and remaining midline structures are unremarkable. No acute extra-axial fluid collections. No mass effect. Vascular: Stable atherosclerosis.  No hyperdense vessel. Skull: Normal. Negative for fracture or focal lesion. Sinuses/Orbits: No acute finding. Other: None. IMPRESSION: 1. No acute intracranial process. Electronically Signed   By: Sharlet Salina M.D.   On: 07/24/2023 21:24   DG Chest Port 1 View  Result Date: 07/24/2023 CLINICAL DATA:  Cough. Forgetfulness. Diagnosed with COVID yesterday. EXAM: PORTABLE CHEST 1 VIEW COMPARISON:  07/04/2023 FINDINGS: Shallow inspiration. Cardiac enlargement. Suggestion of infiltration or atelectasis in both lung bases, greater on the left. Possible small left pleural effusion. No pneumothorax. Degenerative changes in the spine and left shoulder. Postoperative change in the right shoulder. IMPRESSION: Cardiac enlargement. Shallow inspiration with infiltration or atelectasis in the lung bases, greater on the left. Possible small left pleural effusion. Electronically Signed   By: Burman Nieves M.D.   On: 07/24/2023 20:11   CT HEMATURIA WORKUP  Result Date: 07/16/2023 CLINICAL DATA:  Hematuria, gross hematuria EXAM: CT ABDOMEN AND PELVIS WITHOUT AND WITH CONTRAST TECHNIQUE: Multidetector CT imaging of the abdomen and pelvis was performed following the standard protocol before and following the bolus administration of intravenous contrast. RADIATION DOSE REDUCTION: This exam was performed according to the departmental dose-optimization program which includes automated exposure control, adjustment of the mA and/or kV according to  patient size and/or use of iterative reconstruction technique. CONTRAST:  OMNIPAQUE IOHEXOL 300 MG/ML  SOLN COMPARISON:  07/08/2023 FINDINGS: Lower chest: Bilateral small pleural effusions and bibasilar atelectasis. Hepatobiliary: No focal hepatic lesion. Postcholecystectomy. No biliary dilatation. Pancreas: Pancreas is normal. No ductal dilatation. No pancreatic inflammation. Spleen: Normal spleen Adrenals/urinary tract: Adrenal glands and kidneys are normal. Simple fluid attenuation cyst of the RIGHT kidney measures 22 mm. The ureters and bladder normal. Bladder collapsed around Foley catheter. Stomach/Bowel: Stomach, small bowel, appendix, and cecum are normal. The colon and rectosigmoid colon are normal. Vascular/Lymphatic: Abdominal aorta is normal caliber with atherosclerotic calcification. There is no retroperitoneal or periportal lymphadenopathy. No pelvic lymphadenopathy. Reproductive: Unremarkable Other: No free fluid. Mild anasarca of the soft tissues along the flanks. Musculoskeletal: No aggressive osseous lesion. IMPRESSION: 1. No explanation for hematuria. Normal kidneys ureters and bladder. Bladder collapsed around Foley catheter. 2. Right Bosniak I benign renal cyst measuring 2.2 cm. No follow-up imaging is recommended.Tricia Harrison 2018 Feb; [id number], Management of the Incidental Renal Mass on CT, [employer] 2021; [id number], Bosniak Classification of Cystic Renal Masses, Version 2019. 3. Bilateral small pleural effusions and bibasilar atelectasis. 4. Mild anasarca of the soft tissues along the flanks. 5. Aortic Atherosclerosis (ICD10-I70.0). Electronically Signed   By: Genevive Bi M.D.   On: 07/16/2023 09:27   DG Swallowing Func-Speech Pathology  Result Date: 07/09/2023 Table formatting from the original result was not included. Modified Barium Swallow Study Patient Details Name: Blimi Winter MRN: [id number] Date of Birth: 08-16-1946 Today's Date:  07/09/2023 HPI/PMH: HPI: 77 year old with  history of chronic anemia, chronic back pain, chronic constipation, CKD stage IIIb, hypertension and hyperlipidemia, hypothyroidism, dementia, history of pulmonary embolism recently underwent right shoulder replacement, significant postoperative delirium and confusion and was ultimately sent to skilled nursing rehab.  Patient was sent back from the facility with decreased oral intake, nausea and emesis as well as diarrhea along with stool mixed with blood after using laxatives.   Chest x-ray low lung volumes without evidence of acute cardiovascular disease. BSE with possible velopharyngeal dysfunction with hypernasality and complaints of nasal regugitation. MBS 01/14/22 without penetration or aspiration but possible decreased UES opening with barium pooled above UES. Continue Dys 3/thin. Clinical Impression: Clinical Impression: Pt demonstrates mild oropharyngeal dysphagia without laryngeal penetration or aspiration. Impairments include decreased velopharyngeal function, pharyngeal stripping wave and tongue base retraction. Contact between her velum and pharyngeal wall is decreased allowing mild amount of barium to enter this space which did not advance further. There was residue in the vallecular space and pharyngeal wall increasing with viscosity and she swallows 2-4 times in attempts to clear which she eventually reduces to minimal. Orally she had more difficulty transiting the thicker liquids and possibly due to decreased pressure needed to generate transfer. She was able to transit nectar however honey thick she was unable and had to expectorate majority of the bolus. Mastication with solid was slower and deliberate but she was able to break up the solid bolus and initiate a swallow with moderate residue. Subsequent swallows sufficiently decreased stasis. Her PES distention may have been slightly reduced but there was no significant residue seen above this space. Esophageal scan was somewhat limited due to view  however no significant findings present. Pt does not wish to have liquid diet and prefers to stay on solids with chopped meats, Dys 3. Continue thin liquids, multiple swallows and alternate liquids and solids. Pt and RN states she consumes pills with liquid without difficulty (not given during study). ST will continue to follow. Factors that may increase risk of adverse event in presence of aspiration Tricia Harrison & Tricia Harrison 2021): No data recorded Recommendations/Plan: Swallowing Evaluation Recommendations Swallowing Evaluation Recommendations Recommendations: PO diet PO Diet Recommendation: Dysphagia 3 (Mechanical soft); Thin liquids (Level 0) Liquid Administration via: Straw; Cup Medication Administration: Whole meds with liquid Supervision: Patient able to self-feed; Intermittent supervision/cueing for swallowing strategies Swallowing strategies  : Slow rate; Small bites/sips; Multiple dry swallows after each bite/sip; Follow solids with liquids Postural changes: Position pt fully upright for meals Oral care recommendations: Oral care BID (2x/day) Treatment Plan Treatment Plan Treatment recommendations: Therapy as outlined in treatment plan below Follow-up recommendations: -- (TBD) Functional status assessment: Patient has had a recent decline in their functional status and demonstrates the ability to make significant improvements in function in a reasonable and predictable amount of time. Treatment frequency: Min 2x/week Treatment duration: 2 weeks Interventions: Aspiration precaution training; Patient/family education; Diet toleration management by SLP Recommendations Recommendations for follow up therapy are one component of a multi-disciplinary discharge planning process, led by the attending physician.  Recommendations may be updated based on patient status, additional functional criteria and insurance authorization. Assessment: Orofacial Exam: Orofacial Exam Oral Cavity: Oral Hygiene: WFL Oral Cavity -  Dentition: Adequate natural dentition Orofacial Anatomy: WFL Oral Motor/Sensory Function: WFL Anatomy: Anatomy: WFL Boluses Administered: Boluses Administered Boluses Administered: Thin liquids (Level 0); Mildly thick liquids (Level 2, nectar thick); Moderately thick liquids (Level 3, honey thick); Puree; Solid  Oral Impairment Domain: Oral Impairment Domain  Lip Closure: No labial escape Tongue control during bolus hold: Cohesive bolus between tongue to palatal seal Bolus preparation/mastication: Slow prolonged chewing/mashing with complete recollection Bolus transport/lingual motion: Delayed initiation of tongue motion (oral holding) Oral residue: Majority of bolus remaining; Minimal to no Tricia (with honey) Location of oral residue : Tongue Initiation of pharyngeal swallow : Valleculae  Pharyngeal Impairment Domain: Pharyngeal Impairment Domain Soft palate elevation: Trace column of contrast or air between SP and PW (slight- tip) Laryngeal elevation: Complete superior movement of thyroid cartilage with complete approximation of arytenoids to epiglottic petiole Anterior hyoid excursion: Complete anterior movement Epiglottic movement: Complete inversion Laryngeal vestibule closure: Complete, no air/contrast in laryngeal vestibule Pharyngeal stripping wave : Present - diminished Pharyngoesophageal segment opening: Partial distention/partial duration, partial obstruction of flow Tongue base retraction: Trace column of contrast or air between tongue base and PPW Pharyngeal residue: Collection of residue within or on pharyngeal structures Location of pharyngeal residue: Valleculae  Esophageal Impairment Domain: Esophageal Impairment Domain Esophageal Tricia upright position: Complete Tricia, esophageal coating Pill: No data recorded Penetration/Aspiration Scale Score: Penetration/Aspiration Scale Score 1.  Material does not enter airway: Thin liquids (Level 0); Mildly thick liquids (Level 2, nectar thick);  Moderately thick liquids (Level 3, honey thick); Puree; Solid Compensatory Strategies: Compensatory Strategies Compensatory strategies: No   General Information: Caregiver present: No  Diet Prior to this Study: Regular; Thin liquids (Level 0)   Temperature : Normal   Respiratory Status: WFL   Supplemental O2: None (Room air)   History of Recent Intubation: No  Behavior/Cognition: Alert; Cooperative; Pleasant mood Self-Feeding Abilities: Needs assist with self-feeding Baseline vocal quality/speech: Abnormal resonance (hypernasal) Volitional Cough: Able to elicit Volitional Swallow: Able to elicit Exam Limitations: No limitations Goal Planning: Prognosis for improved oropharyngeal function: Good No data recorded No data recorded No data recorded Consulted and agree with results and recommendations: Patient; Nurse Pain: Pain Assessment Pain Assessment: Faces Faces Pain Scale: 2 Pain Location: -- (sitting up in chair) Pain Descriptors / Indicators: Discomfort Pain Intervention(s): Monitored during session End of Session: Start Time:SLP Start Time (ACUTE ONLY): 0921 Stop Time: SLP Stop Time (ACUTE ONLY): 1610 Time Calculation:SLP Time Calculation (min) (ACUTE ONLY): 17 min Charges: SLP Evaluations $ SLP Speech Visit: 1 Visit SLP Evaluations $BSS Swallow: 1 Procedure $MBS Swallow: 1 Procedure SLP visit diagnosis: SLP Visit Diagnosis: Dysphagia, oropharyngeal phase (R13.12) Past Medical History: Past Medical History: Diagnosis Date  Anemia   as a child  Anginal pain (HCC)   Asthma   as a child  Bladder incontinence   Chronic back pain   spinal stenosis and buldging disc;scoliosis  Chronic constipation   occasionally takes something OTC  Chronic kidney disease   Diabetes mellitus without complication (HCC)   per pt "Pre" for 20+yrs  Diverticulosis   DVT (deep venous thrombosis) (HCC)   Dyspnea   occasional  Gallstones   has known about this for 3-54yrs  GERD (gastroesophageal reflux disease)   takes Omeprazole daily   Glaucoma   H/O hiatal hernia   Heart murmur   Hemorrhoids   History of blood transfusion   no abnormal reaction noted  History of bronchitis   states its been a long time ago  History of gout   HLD (hyperlipidemia)   takes Fenofibrate daily  HTN (hypertension)   takes Diltiazem and Ramipril daily  Hypothyroidism   takes Synthroid daily  Left knee DJD   Macular degeneration   Nocturia   Palpitations   started in 2013 and only  occasionally;last time noticed about 2-3wks ago and after drinking caffeine  Peripheral vascular disease (HCC)   PONV (postoperative nausea and vomiting)   problems swallowing after anethesia- 2/2023and slurred speech also  Pulmonary embolism with acute cor pulmonale (HCC) 07/2018  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  Sleep apnea   does not uses cpap Past Surgical History: Past Surgical History: Procedure Laterality Date  3-day EVENT MONITOR  01/2019  Mostly normal monitor.  Predominantly sinus rhythm (rate range 48-105 bpm, average 67 bpm) 9 short atrial runs (fastest = 13 beats rate 135 bpm, longest ~17 seconds-101 bpm) -> not noted on symptom log.  No sustained arrhythmias (tachycardia or bradycardia), or pauses..  Symptoms are noted with PVCs.  bilateral cataract surgery     CHOLECYSTECTOMY N/A 01/10/2022  Procedure: LAPAROSCOPIC CHOLECYSTECTOMY WITH INTRAOPERATIVE CHOLANGIOGRAM;  Surgeon: Karie Soda, MD;  Location: [facility];  Service: General;  Laterality: N/A;  COLONOSCOPY    CT ANGIOGRAM OF CHEST - PE PROTOCOL  07/2018  Bilateral nonocclusive pulmonary emboli evidence of right heart strain consistent with "submassive "/intermediate PE.  -->  Code PE called.  DILATION AND CURETTAGE OF UTERUS    multiple about 6-7 times  ESOPHAGOGASTRODUODENOSCOPY    NM VQ LUNG SCAN ([facility] HX)  11/23/2018  Low probability for PE  REVERSE SHOULDER ARTHROPLASTY Right 06/17/2023  Procedure: RIGHT REVERSE SHOULDER ARTHROPLASTY;  Surgeon: Teryl Lucy, MD;  Location: [facility];  Service: Orthopedics;  Laterality: Right;  RIGHT/LEFT HEART CATH AND CORONARY ANGIOGRAPHY N/A 08/19/2018  Procedure: RIGHT/LEFT HEART CATH AND CORONARY ANGIOGRAPHY;  Surgeon: Corky Crafts, MD;  [facility] INVASIVE CV LAB;  Service: Cardiovascular:: Nonobstructive CAD.  Mild pulmonary pretension.  Mean PA pressure 26 mmHg with PA P 48/20 mmHg.  Normal LVEDP.  THYROID SURGERY Right 2010  TOTAL HIP ARTHROPLASTY Left 2005  TOTAL KNEE ARTHROPLASTY Left 02/21/2014  Procedure: TOTAL KNEE ARTHROPLASTY;  Surgeon: Nilda Simmer, MD;  Location: [facility] OR;  Service: Orthopedics;  Laterality: Left;  TRANSTHORACIC ECHOCARDIOGRAM  07/2018   (In setting of acute PE) mild LVH.  EF 55 to 60%.  GR 1 DD.  Aortic sclerosis but no stenosis.  Mild regurgitation.  Mild RA dilation.  Moderate LA dilation.  Moderate tricuspid regurgitation with severe primary hypertension estimated PA pressure 71 mmHg.  RV poorly visualized  TRANSTHORACIC ECHOCARDIOGRAM  10/2018 - 11/2019  a) EF 60-65%.  GR 1 DD.  Focal basal LVH. Mod AI/ no AS. Mild LA dilation.  Normal RV size and fxn.  Mod PI w/ mild TR.  Peak PAP ~ 38 mmHg; b) Normal EF 60 to 65%.  Moderate LVH-GR 1 DD.  R WMA.  Mild AS w/ trivial AI.  Grossly normal PA, RV and RA size.  Normal RV function.  Normal PA pressures.  TRANSTHORACIC ECHOCARDIOGRAM  01/2021   EF 55%.  Normal LV function.  Mild LVH.  GR 1 DD.  Normal PA pressures.  Severe LA dilation.  (Consistent with more significant diastolic dysfunction).  Mild-possibly mild to moderate aortic insufficiency.  Borderline elevated RAP.  Trivial pericardial effusion.  (Stable)  VAGINAL HYSTERECTOMY  [address] Royce Macadamia 07/09/2023, 10:35 AM   CT ABDOMEN PELVIS WO CONTRAST  Result Date: 07/08/2023 CLINICAL DATA:  Severe left lower quadrant abdominal pain, bloody stools EXAM: CT ABDOMEN AND PELVIS WITHOUT CONTRAST TECHNIQUE: Multidetector CT imaging of the abdomen and pelvis was performed following  the standard protocol without  IV contrast. RADIATION DOSE REDUCTION: This exam was performed according to the departmental dose-optimization program which includes automated exposure control, adjustment of the mA and/or kV according to patient size and/or use of iterative reconstruction technique. COMPARISON:  01/30/2017 FINDINGS: Lower chest: Mild cardiomegaly. Moderate pericardial effusion. Coronary artery calcifications. Trace bilateral pleural effusions and associated atelectasis or consolidation. Moderate hiatal hernia with intrathoracic position of the gastric fundus. Hepatobiliary: No focal liver abnormality is seen. Status post cholecystectomy. No biliary dilatation. Pancreas: Unremarkable. No pancreatic ductal dilatation or surrounding inflammatory changes. Spleen: Normal in size without significant abnormality. Adrenals/Urinary Tract: Adrenal glands are unremarkable. Moderate bilateral hydronephrosis and hydroureter to the ureterovesicular junction without calculus or other obstruction. Distended urinary bladder. Stomach/Bowel: Stomach is within normal limits. Appendix appears normal. Soft tissue thickening about the low rectum (series 3, image 78). Cecal, descending and sigmoid diverticulosis. Vascular/Lymphatic: Aortic atherosclerosis. No enlarged abdominal or pelvic lymph nodes. Reproductive: Status post hysterectomy. Other: No abdominal wall hernia.  Anasarca.  No ascites. Musculoskeletal: No acute or significant osseous findings. Status post left hip total arthroplasty. IMPRESSION: 1. Soft tissue thickening about the low rectum. This is nonspecific; neoplasm not excluded. Recommend direct visualization. 2. Moderate bilateral hydronephrosis and hydroureter to the ureterovesicular junction without calculus or other obstruction. This is presumably secondary to back pressure in the setting of urinary bladder distention. Correlate for urinary retention. 3. Cecal, descending and sigmoid diverticulosis  without evidence of acute diverticulitis. 4. Moderate pericardial effusion. 5. Trace bilateral pleural effusions and associated atelectasis or consolidation. 6. Moderate hiatal hernia with intrathoracic position of the gastric fundus. 7. Coronary artery disease. Aortic Atherosclerosis (ICD10-I70.0). Electronically Signed   By: Jearld Lesch M.D.   On: 07/08/2023 14:16     Subjective: No acute issues or events overnight   Discharge Exam: Vitals:   08/05/23 0825 08/05/23 1216  BP: 115/79 105/83  Pulse: (!) 110 80  Resp: 18 20  Temp: 98.7 F (37.1 C) 99.4 F (37.4 C)  SpO2: 100% 96%   Vitals:   08/05/23 0422 08/05/23 0432 08/05/23 0825 08/05/23 1216  BP: (!) 70/57 120/63 115/79 105/83  Pulse: 75 79 (!) 110 80  Resp: 18  18 20   Temp: 98.3 F (36.8 C)  98.7 F (37.1 C) 99.4 F (37.4 C)  TempSrc:   Oral Oral  SpO2: 100%  100% 96%  Weight:      Height:        General: Pt is alert, awake, not in acute distress Cardiovascular: RRR, S1/S2 +, no rubs, no gallops Respiratory: CTA bilaterally, no wheezing, no rhonchi Abdominal: Soft, NT, ND, bowel sounds + Extremities: no edema, no cyanosis    The results of significant diagnostics from this hospitalization (including imaging, microbiology, ancillary and laboratory) are listed below for reference.     Microbiology: No results found for this or any previous visit (from the past 240 hour(s)).   Labs: BNP (last 3 results) No results for input(s): "BNP" in the last 8760 hours. Basic Metabolic Panel: Recent Labs  Lab 07/30/23 0535 08/01/23 0551  NA 128* 133*  K 4.4 4.3  CL 102 107  CO2 20* 19*  GLUCOSE 90 93  BUN 14 12  CREATININE 0.92 0.87  CALCIUM 6.9* 7.2*  MG 1.9  --   PHOS 2.3*  --    Liver Function Tests: No results for input(s): "AST", "ALT", "ALKPHOS", "BILITOT", "PROT", "ALBUMIN" in the last 168 hours. No results for input(s): "LIPASE", "AMYLASE" in the last 168 hours. No results for  input(s): "AMMONIA" in  the last 168 hours. CBC: Recent Labs  Lab 07/30/23 0535 08/01/23 0551  WBC 3.4* 4.0  HGB 9.2* 9.6*  HCT 28.8* 29.7*  MCV 96.0 94.3  PLT 238 258   Cardiac Enzymes: No results for input(s): "CKTOTAL", "CKMB", "CKMBINDEX", "TROPONINI" in the last 168 hours. BNP: Invalid input(s): "POCBNP" CBG: Recent Labs  Lab 08/04/23 1855 08/04/23 2355 08/05/23 0421 08/05/23 0424 08/05/23 1213  GLUCAP 165* 86 55* 74 85   D-Dimer No results for input(s): "DDIMER" in the last 72 hours. Hgb A1c No results for input(s): "HGBA1C" in the last 72 hours. Lipid Profile No results for input(s): "CHOL", "HDL", "LDLCALC", "TRIG", "CHOLHDL", "LDLDIRECT" in the last 72 hours. Thyroid function studies No results for input(s): "TSH", "T4TOTAL", "T3FREE", "THYROIDAB" in the last 72 hours.  Invalid input(s): "FREET3" Anemia work up No results for input(s): "VITAMINB12", "FOLATE", "FERRITIN", "TIBC", "IRON", "RETICCTPCT" in the last 72 hours. Urinalysis    Component Value Date/Time   COLORURINE YELLOW 08/03/2023 1651   APPEARANCEUR HAZY (A) 08/03/2023 1651   LABSPEC 1.008 08/03/2023 1651   PHURINE 6.0 08/03/2023 1651   GLUCOSEU NEGATIVE 08/03/2023 1651   HGBUR MODERATE (A) 08/03/2023 1651   BILIRUBINUR NEGATIVE 08/03/2023 1651   KETONESUR NEGATIVE 08/03/2023 1651   PROTEINUR 30 (A) 08/03/2023 1651   UROBILINOGEN 0.2 02/14/2014 1156   NITRITE NEGATIVE 08/03/2023 1651   LEUKOCYTESUR MODERATE (A) 08/03/2023 1651   Sepsis Labs Recent Labs  Lab 07/30/23 0535 08/01/23 0551  WBC 3.4* 4.0   Microbiology No results found for this or any previous visit (from the past 240 hour(s)).   Time coordinating discharge: Over 30 minutes  SIGNED:   Azucena Fallen, DO [facility] 08/05/2023, 2:34 PM Pager   If 7PM-7AM, please contact night-coverage [email or web address]

## 2023-08-06 ENCOUNTER — Other Ambulatory Visit (HOSPITAL_COMMUNITY): Payer: Self-pay

## 2023-08-06 ENCOUNTER — Telehealth (HOSPITAL_COMMUNITY): Payer: Self-pay | Admitting: Pharmacy Technician

## 2023-08-06 DIAGNOSIS — G9341 Metabolic encephalopathy: Principal | ICD-10-CM

## 2023-08-06 DIAGNOSIS — A419 Sepsis, unspecified organism: Secondary | ICD-10-CM

## 2023-08-06 DIAGNOSIS — R627 Adult failure to thrive: Secondary | ICD-10-CM

## 2023-08-06 LAB — GLUCOSE, CAPILLARY
Glucose-Capillary: 67 mg/dL — ABNORMAL LOW (ref 70–99)
Glucose-Capillary: 73 mg/dL (ref 70–99)
Glucose-Capillary: 83 mg/dL (ref 70–99)

## 2023-08-06 NOTE — Progress Notes (Signed)
Family here at bedside - son Caryn Bee confirmed DME has been delivered to home - [facility] transport to home. This RN notified Aundra Millet

## 2023-08-06 NOTE — Plan of Care (Signed)
Patient discharged 9/10 but unable to leave until 9/11 as family awaiting DME delivery. Patient able to d/c home today via [facility].  No changes needed from prior d/c summary on 9/10.  Of note, megace denied from prior auth. Chart reviewed and no suspected benefit from this medication for it's intended use. Focus on symptom management and comfort care should be priority. [facility] to continue following patient at discharge.   Lewie Chamber, MD [facility] 08/06/2023, 3:25 PM

## 2023-08-06 NOTE — TOC Transition Note (Signed)
Transition of Care [facility]) - CM/SW Discharge Note  Patient Details  Name: Tricia Harrison MRN: [id number] Date of Birth: 28-Apr-1946  [facility] CM/SW Contact:  Ewing Schlein, LCSW Phone Number: 08/06/2023, 10:12 AM  Clinical Narrative: CSW spoke with son, Caryn Bee, and confirmed DME has been delivered to the home. Medical necessity form done; [facility] scheduled. Discharge packet completed. RN updated. TOC signing off.    Final next level of care: Home w Hospice Care Barriers to Discharge: Barriers Resolved  Patient Goals and CMS Choice CMS Medicare.gov Compare Post Acute Care list provided to:: Patient Represenative (must comment) Choice offered to / list presented to : Adult Children  Discharge Placement Patient to be transferred to facility by: [facility] Name of family member notified: Charolotte Eke (son) Patient and family notified of of transfer: 08/06/23  Discharge Plan and Services Additional resources added to the After Visit Summary for   Post Acute Care Choice: Skilled Nursing Facility          DME Arranged: Other see comment (Hospice delivered DME to home.)  Social Determinants of Health (SDOH) Interventions SDOH Screenings   Food Insecurity: No Food Insecurity (07/05/2023)  Housing: Patient Unable To Answer (07/05/2023)  Transportation Needs: No Transportation Needs (07/26/2023)  Utilities: Not At Risk (07/26/2023)  Depression (PHQ2-9): Low Risk  (11/09/2018)  Tobacco Use: Medium Risk (07/24/2023)   Readmission Risk Interventions    07/08/2023    4:14 PM  Readmission Risk Prevention Plan  Transportation Screening Complete  PCP or Specialist Appt within 5-7 Days Complete  Home Care Screening Complete  Medication Review (RN CM) Complete

## 2023-08-06 NOTE — Telephone Encounter (Signed)
Pharmacy Patient Advocate Encounter   Received notification that prior authorization for Megestrol Acetate 400MG /10ML suspension is required/requested.   Insurance verification completed.   The patient is insured through [facility] .   Per test claim: PA required; PA submitted to [facility] via CoverMyMeds Key/confirmation #/EOC [id number] Status is pending

## 2023-08-06 NOTE — Telephone Encounter (Signed)
Pharmacy Patient Advocate Encounter  Received notification from [facility] that Prior Authorization for Megestrol Acetate 400MG /10ML suspension  has been DENIED.  Full denial letter will be uploaded to the media tab. See denial reason below. Drugs when used for anorexia, weight loss, or weight gain are excluded from coverage under Medicare rules. This product is on the high-risk medication list and is not recommended in patients 65 years and older due to a higher risk potential for side effects   PA #/Case ID/Reference #: [id number]

## 2023-08-26 ENCOUNTER — Institutional Professional Consult (permissible substitution): Payer: Medicare HMO | Admitting: Neurology

## 2023-09-01 ENCOUNTER — Other Ambulatory Visit: Payer: Self-pay | Admitting: Cardiology

## 2024-11-25 DEATH — deceased
